# Patient Record
Sex: Female | Born: 1935 | Race: Black or African American | Hispanic: No | Marital: Married | Smoking: Former smoker
Health system: Southern US, Community
[De-identification: ages and names within clinical notes are randomized; demographics above are authoritative.]

## PROBLEM LIST (undated history)

## (undated) DIAGNOSIS — D649 Anemia, unspecified: Secondary | ICD-10-CM

## (undated) DIAGNOSIS — Z860100 Personal history of colon polyps, unspecified: Secondary | ICD-10-CM

## (undated) DIAGNOSIS — K922 Gastrointestinal hemorrhage, unspecified: Secondary | ICD-10-CM

## (undated) DIAGNOSIS — E7800 Pure hypercholesterolemia, unspecified: Secondary | ICD-10-CM

## (undated) DIAGNOSIS — I499 Cardiac arrhythmia, unspecified: Secondary | ICD-10-CM

## (undated) DIAGNOSIS — J90 Pleural effusion, not elsewhere classified: Secondary | ICD-10-CM

## (undated) DIAGNOSIS — Z9289 Personal history of other medical treatment: Secondary | ICD-10-CM

## (undated) DIAGNOSIS — E119 Type 2 diabetes mellitus without complications: Secondary | ICD-10-CM

## (undated) DIAGNOSIS — E049 Nontoxic goiter, unspecified: Secondary | ICD-10-CM

## (undated) DIAGNOSIS — Z923 Personal history of irradiation: Secondary | ICD-10-CM

## (undated) DIAGNOSIS — N186 End stage renal disease: Secondary | ICD-10-CM

## (undated) DIAGNOSIS — I10 Essential (primary) hypertension: Secondary | ICD-10-CM

## (undated) DIAGNOSIS — M810 Age-related osteoporosis without current pathological fracture: Secondary | ICD-10-CM

## (undated) DIAGNOSIS — J189 Pneumonia, unspecified organism: Secondary | ICD-10-CM

## (undated) DIAGNOSIS — R0989 Other specified symptoms and signs involving the circulatory and respiratory systems: Secondary | ICD-10-CM

## (undated) DIAGNOSIS — N183 Chronic kidney disease, stage 3 (moderate): Secondary | ICD-10-CM

## (undated) DIAGNOSIS — E079 Disorder of thyroid, unspecified: Secondary | ICD-10-CM

## (undated) DIAGNOSIS — I480 Paroxysmal atrial fibrillation: Secondary | ICD-10-CM

## (undated) DIAGNOSIS — E1129 Type 2 diabetes mellitus with other diabetic kidney complication: Secondary | ICD-10-CM

## (undated) DIAGNOSIS — D472 Monoclonal gammopathy: Secondary | ICD-10-CM

## (undated) DIAGNOSIS — K219 Gastro-esophageal reflux disease without esophagitis: Secondary | ICD-10-CM

## (undated) DIAGNOSIS — C50919 Malignant neoplasm of unspecified site of unspecified female breast: Secondary | ICD-10-CM

## (undated) DIAGNOSIS — R490 Dysphonia: Secondary | ICD-10-CM

## (undated) DIAGNOSIS — R0602 Shortness of breath: Secondary | ICD-10-CM

## (undated) DIAGNOSIS — I509 Heart failure, unspecified: Secondary | ICD-10-CM

## (undated) DIAGNOSIS — K259 Gastric ulcer, unspecified as acute or chronic, without hemorrhage or perforation: Secondary | ICD-10-CM

## (undated) DIAGNOSIS — J3800 Paralysis of vocal cords and larynx, unspecified: Secondary | ICD-10-CM

## (undated) HISTORY — PX: KNEE SURGERY: SHX244

## (undated) HISTORY — DX: Malignant neoplasm of unspecified site of unspecified female breast: C50.919

## (undated) HISTORY — PX: BREAST BIOPSY: SHX20

## (undated) HISTORY — PX: ABDOMINAL HYSTERECTOMY: SHX81

## (undated) HISTORY — PX: CYSTECTOMY: SUR359

## (undated) HISTORY — DX: Anemia, unspecified: D64.9

## (undated) HISTORY — PX: CARPAL TUNNEL RELEASE: SHX101

## (undated) HISTORY — PX: ESOPHAGOGASTRODUODENOSCOPY: SHX1529

## (undated) HISTORY — DX: Paroxysmal atrial fibrillation: I48.0

## (undated) HISTORY — DX: Disorder of thyroid, unspecified: E07.9

## (undated) HISTORY — PX: COLONOSCOPY: SHX174

## (undated) HISTORY — DX: Heart failure, unspecified: I50.9

## (undated) HISTORY — PX: THYROID SURGERY: SHX805

---

## 2000-12-21 ENCOUNTER — Other Ambulatory Visit: Admission: RE | Admit: 2000-12-21 | Discharge: 2000-12-21 | Payer: Self-pay | Admitting: Internal Medicine

## 2000-12-24 ENCOUNTER — Encounter: Payer: Self-pay | Admitting: Internal Medicine

## 2000-12-24 ENCOUNTER — Encounter: Admission: RE | Admit: 2000-12-24 | Discharge: 2000-12-24 | Payer: Self-pay | Admitting: Internal Medicine

## 2001-01-05 ENCOUNTER — Encounter: Admission: RE | Admit: 2001-01-05 | Discharge: 2001-04-05 | Payer: Self-pay | Admitting: Internal Medicine

## 2001-01-06 ENCOUNTER — Ambulatory Visit (HOSPITAL_COMMUNITY): Admission: RE | Admit: 2001-01-06 | Discharge: 2001-01-06 | Payer: Self-pay | Admitting: Internal Medicine

## 2001-01-06 ENCOUNTER — Encounter: Payer: Self-pay | Admitting: Internal Medicine

## 2001-01-11 ENCOUNTER — Encounter: Admission: RE | Admit: 2001-01-11 | Discharge: 2001-01-11 | Payer: Self-pay | Admitting: Hematology and Oncology

## 2001-01-11 ENCOUNTER — Encounter: Payer: Self-pay | Admitting: Internal Medicine

## 2001-01-13 ENCOUNTER — Encounter: Payer: Self-pay | Admitting: Internal Medicine

## 2001-01-13 ENCOUNTER — Encounter: Admission: RE | Admit: 2001-01-13 | Discharge: 2001-01-13 | Payer: Self-pay | Admitting: Internal Medicine

## 2001-02-09 ENCOUNTER — Encounter: Admission: RE | Admit: 2001-02-09 | Discharge: 2001-02-09 | Payer: Self-pay | Admitting: General Surgery

## 2001-02-09 ENCOUNTER — Encounter: Payer: Self-pay | Admitting: General Surgery

## 2001-02-09 ENCOUNTER — Ambulatory Visit (HOSPITAL_BASED_OUTPATIENT_CLINIC_OR_DEPARTMENT_OTHER): Admission: RE | Admit: 2001-02-09 | Discharge: 2001-02-09 | Payer: Self-pay | Admitting: General Surgery

## 2001-02-09 ENCOUNTER — Encounter (INDEPENDENT_AMBULATORY_CARE_PROVIDER_SITE_OTHER): Payer: Self-pay | Admitting: *Deleted

## 2001-03-23 ENCOUNTER — Ambulatory Visit: Admission: RE | Admit: 2001-03-23 | Discharge: 2001-03-23 | Payer: Self-pay | Admitting: Gynecology

## 2001-07-23 ENCOUNTER — Inpatient Hospital Stay (HOSPITAL_COMMUNITY): Admission: RE | Admit: 2001-07-23 | Discharge: 2001-07-30 | Payer: Self-pay | Admitting: Obstetrics and Gynecology

## 2001-07-23 ENCOUNTER — Encounter (INDEPENDENT_AMBULATORY_CARE_PROVIDER_SITE_OTHER): Payer: Self-pay

## 2001-07-26 ENCOUNTER — Encounter: Payer: Self-pay | Admitting: Obstetrics and Gynecology

## 2001-07-27 ENCOUNTER — Encounter: Payer: Self-pay | Admitting: Obstetrics and Gynecology

## 2001-08-26 ENCOUNTER — Encounter: Admission: RE | Admit: 2001-08-26 | Discharge: 2001-11-24 | Payer: Self-pay | Admitting: Internal Medicine

## 2001-08-27 ENCOUNTER — Inpatient Hospital Stay (HOSPITAL_COMMUNITY): Admission: EM | Admit: 2001-08-27 | Discharge: 2001-08-30 | Payer: Self-pay | Admitting: Emergency Medicine

## 2001-08-27 ENCOUNTER — Encounter: Payer: Self-pay | Admitting: Internal Medicine

## 2002-09-26 ENCOUNTER — Encounter: Payer: Self-pay | Admitting: Internal Medicine

## 2002-09-26 ENCOUNTER — Encounter: Admission: RE | Admit: 2002-09-26 | Discharge: 2002-09-26 | Payer: Self-pay | Admitting: Internal Medicine

## 2002-11-22 ENCOUNTER — Encounter: Admission: RE | Admit: 2002-11-22 | Discharge: 2003-02-20 | Payer: Self-pay | Admitting: Internal Medicine

## 2003-02-13 ENCOUNTER — Encounter (INDEPENDENT_AMBULATORY_CARE_PROVIDER_SITE_OTHER): Payer: Self-pay | Admitting: *Deleted

## 2003-02-13 ENCOUNTER — Ambulatory Visit (HOSPITAL_COMMUNITY): Admission: RE | Admit: 2003-02-13 | Discharge: 2003-02-13 | Payer: Self-pay | Admitting: General Surgery

## 2003-03-01 ENCOUNTER — Ambulatory Visit (HOSPITAL_COMMUNITY): Admission: RE | Admit: 2003-03-01 | Discharge: 2003-03-01 | Payer: Self-pay | Admitting: General Surgery

## 2003-11-01 ENCOUNTER — Ambulatory Visit (HOSPITAL_COMMUNITY): Admission: RE | Admit: 2003-11-01 | Discharge: 2003-11-01 | Payer: Self-pay | Admitting: Gastroenterology

## 2003-11-01 ENCOUNTER — Encounter (INDEPENDENT_AMBULATORY_CARE_PROVIDER_SITE_OTHER): Payer: Self-pay | Admitting: Specialist

## 2004-01-16 ENCOUNTER — Ambulatory Visit (HOSPITAL_COMMUNITY): Admission: RE | Admit: 2004-01-16 | Discharge: 2004-01-16 | Payer: Self-pay | Admitting: Gastroenterology

## 2005-04-15 ENCOUNTER — Ambulatory Visit (HOSPITAL_COMMUNITY): Admission: RE | Admit: 2005-04-15 | Discharge: 2005-04-15 | Payer: Self-pay | Admitting: General Surgery

## 2005-04-15 ENCOUNTER — Encounter (INDEPENDENT_AMBULATORY_CARE_PROVIDER_SITE_OTHER): Payer: Self-pay | Admitting: *Deleted

## 2008-04-25 ENCOUNTER — Encounter: Admission: RE | Admit: 2008-04-25 | Discharge: 2008-04-25 | Payer: Self-pay | Admitting: *Deleted

## 2008-04-26 ENCOUNTER — Ambulatory Visit (HOSPITAL_BASED_OUTPATIENT_CLINIC_OR_DEPARTMENT_OTHER): Admission: RE | Admit: 2008-04-26 | Discharge: 2008-04-26 | Payer: Self-pay | Admitting: *Deleted

## 2010-03-30 ENCOUNTER — Encounter (HOSPITAL_BASED_OUTPATIENT_CLINIC_OR_DEPARTMENT_OTHER): Payer: Self-pay | Admitting: General Surgery

## 2010-05-21 ENCOUNTER — Other Ambulatory Visit (HOSPITAL_COMMUNITY): Payer: Self-pay | Admitting: Endocrinology

## 2010-05-21 DIAGNOSIS — E0590 Thyrotoxicosis, unspecified without thyrotoxic crisis or storm: Secondary | ICD-10-CM

## 2010-06-10 ENCOUNTER — Ambulatory Visit (HOSPITAL_COMMUNITY)
Admission: RE | Admit: 2010-06-10 | Discharge: 2010-06-10 | Disposition: A | Discharge status: Home / Self Care | Payer: Medicare Other | Source: Ambulatory Visit | Attending: Endocrinology | Admitting: Endocrinology

## 2010-06-10 ENCOUNTER — Encounter (HOSPITAL_COMMUNITY)
Admission: RE | Admit: 2010-06-10 | Discharge: 2010-06-10 | Disposition: A | Discharge status: Home / Self Care | Payer: Medicare Other | Source: Ambulatory Visit | Attending: Endocrinology | Admitting: Endocrinology

## 2010-06-10 DIAGNOSIS — E0590 Thyrotoxicosis, unspecified without thyrotoxic crisis or storm: Secondary | ICD-10-CM | POA: Insufficient documentation

## 2010-06-10 DIAGNOSIS — E042 Nontoxic multinodular goiter: Secondary | ICD-10-CM | POA: Insufficient documentation

## 2010-06-11 MED ORDER — SODIUM IODIDE I 131 CAPSULE: 10.3000 | Freq: Once | INTRAVENOUS | Status: DC | PRN

## 2010-06-11 MED ORDER — SODIUM PERTECHNETATE TC 99M INJECTION: 10.0000 | Freq: Once | INTRAVENOUS | Status: DC | PRN

## 2010-06-25 LAB — BASIC METABOLIC PANEL
CO2: 29 mEq/L (ref 19–32)
Chloride: 104 mEq/L (ref 96–112)
GFR calc non Af Amer: 38 mL/min — ABNORMAL LOW (ref >60)
Glucose, Bld: 112 mg/dL — ABNORMAL HIGH (ref 70–99)
Potassium: 4 mEq/L (ref 3.5–5.1)
Sodium: 141 mEq/L (ref 135–145)

## 2010-06-25 LAB — GLUCOSE, CAPILLARY: Glucose-Capillary: 95 mg/dL (ref 70–99)

## 2010-07-23 NOTE — Op Note (Signed)
[name], Gross               ACCOUNT NO.:  [id number]   MEDICAL RECORD NO.:  [id number]          PATIENT TYPE:  AMB   LOCATION:  DSC                          FACILITY:  [facility]   PHYSICIAN:  Lowell Bouton, M.D.DATE OF BIRTH:  [birth year]   DATE OF PROCEDURE:  04/26/2008  DATE OF DISCHARGE:                               OPERATIVE REPORT   PREOPERATIVE DIAGNOSIS:  Right carpal tunnel syndrome.   POSTOPERATIVE DIAGNOSIS:  Right carpal tunnel syndrome.   PROCEDURE:  Decompression, median nerve, right carpal tunnel.   SURGEON:  Lowell Bouton, MD   ANESTHESIA:  Marcaine 0.5% local with sedation.   OPERATIVE FINDINGS:  The patient had marked tenosynovitis of her flexor  tendons.  There were no masses in the carpal canal and the motor branch  of the nerve was intact.   PROCEDURE:  Under 0.5% Marcaine local anesthesia with a tourniquet on  the right arm, the right hand was prepped and draped in the usual  fashion and after exsanguinating the limb, the tourniquet was inflated  to 250 mmHg.  A 3-cm longitudinal incision was made just ulnar to the  thenar crease and carried down through the subcutaneous tissues.  Blunt  dissection was carried through the superficial palmar fascia distal to  the transverse carpal ligament.  A hemostat was then placed in the  carpal canal up against the hook of the hamate and the transverse carpal  ligament was divided on the ulnar border of median nerve.  The proximal  end of the ligament was divided with the scissors after dissecting the  nerve away from the undersurface of the ligament.  The carpal canal was  then palpated and it was found to be adequately decompressed.  The nerve  was examined and the motor branch was identified.  The wound was  irrigated copiously with saline.  The skin was closed with 4-0 nylon  sutures.  Sterile dressings were applied followed by a volar wrist  splint.  The patient tolerated the procedure well  and went to the  recovery room awake in stable and good condition.      Lowell Bouton, M.D.  Electronically Signed     EMM/MEDQ  D:  04/26/2008  T:  04/27/2008  Job:  [id number]

## 2010-07-26 NOTE — Op Note (Signed)
NAMEJESSALYN, Alison Gross               ACCOUNT NO.:  [id number]   MEDICAL RECORD NO.:  [id number]          PATIENT TYPE:  AMB   LOCATION:  SDS                          FACILITY:  [facility]   PHYSICIAN:  Adolph Pollack, M.D.DATE OF BIRTH:  [birth year]   DATE OF PROCEDURE:  DATE OF DISCHARGE:                                 OPERATIVE REPORT   PREOPERATIVE DIAGNOSIS:  Recurrent lipoma of the posterior neck.   POSTOPERATIVE DIAGNOSIS:  Recurrent lipoma of the posterior neck, pending  pathology.   PROCEDURE:  Excision of 5 cm recurrent lipoma from posterior neck.   ANESTHESIA:  General plus 0.5% Marcaine with epinephrine.   INDICATIONS:  This is a 77 year old female who had a lipoma of the neck area  removed back in 2004. She began noticing swelling and another growth  coming back in the area. She indeed has a 5 cm recurrent mass present and  now presents for excision.  The procedure and the risks were discussed with  her preoperatively.   TECHNIQUE:  She is seen in the holding area and the area marked with my  initials in the posterior neck. She is then brought to the operating room on  the stretcher, and the general anesthetic was administered. She was turned  prone on the operating table and with appropriate padding. The posterior  neck was sterilely prepped and draped. The previous incision was reincised  through the skin and dermis. Lipomatous mass was noted immediately upon  incising the dermis. Using sharp dissection, I dissected a lipomatous mass  free from the underlying tissue in all directions. It was adherent to scar  and fascia deep. Once I excised part of this sharply, I then handed that  part off the field. inferiorly, there was more noted to be present, and this  was also excised sharply. In total, it measured about 5 cm. This was sent to  pathology, and again all appeared to be consistent with lipoma.   The area was inspected. Bleeding was controlled with electrocautery.  Once  hemostasis was adequate, local anesthetic solution was infiltrated  superficially and deep into the wound. The wound was then closed in two  layers, closing the subcutaneous tissue with a running 3-0 Monocryl suture,  and then closing the skin with a running 3-0 Monocryl subcuticular stitch.  Steri-Strips and a sterile dressing were applied.   She tolerated procedure well, without any apparent complications. She  subsequently was taken to recovery in satisfactory condition.      Adolph Pollack, M.D.  Electronically Signed    TJR/MEDQ  D:  04/15/2005  T:  04/15/2005  Job:  [id number]   cc:   Candyce Churn. Allyne Gee, M.D.  Fax: [phone]

## 2010-07-26 NOTE — Discharge Summary (Signed)
[facility]  Patient:    Alison Gross, Alison Gross Visit Number: [id number] MRN: [id number]          Service Type: GYN Location: 9300 [id number] Attending Physician:  Jaymes Graff A Dictated by:   Pierre Bali Normand Sloop, M.D. Admit Date:  07/23/2001 Discharge Date: 07/30/2001                             Discharge Summary  PREOPERATIVE DIAGNOSIS:  Symptomatic fibroids.  POSTOPERATIVE DIAGNOSES: 1. Status post total abdominal hysterectomy and bilateral    salpingo-oophorectomy. 2. Febrile morbidity with pelvic collection.  DISCHARGE MEDICATIONS: 1. Motrin. 2. Percocet. 3. Colace. 4. Dulcolax. 5. Levaquin. 6. Flagyl.  ACTIVITY:  The patient is to remain on pelvic rest, no douching, tampons, or sex.  DIET:  Regular.  WOUND CARE:  The patient is to keep the area of her wound clean and dry.  To call for any exudative greenish discharge from her incision.  DISCHARGE INSTRUCTIONS:  She is to call for a temperature greater than 100, heavy vaginal bleeding, or soaking more than a pad a hour.  FOLLOWUP:  To our office on Aug 03, 2001 at 10:30 a.m. for staple removal.  HISTORY OF PRESENT ILLNESS:  Alison Gross is a 77 year old African-American female who underwent a total abdominal hysterectomy and bilateral salpingo-oophorectomy for symptomatic fibroids.  The patients course was complicated by a pelvic collection and febrile morbidity with temperatures as high as 102.5.  The patient was started on Unasyn directly postoperatively for a temperature and did not improve.  A CT scan of the abdomen and pelvis was done which was significant for a 5 x 3 cm pelvic collection.  The patient had no other signs of infection or septic phlebitis, and both ureters were seen to be intact on CT scan.  Despite the Unasyn, the patient still had spiking temperatures, and was changed to broader coverage with Levaquin and Flagyl. After being started on the new regimen, the patient  still had some increased temperature, however, her temperature curve started to improve.  Dr. Irish Lack from interventional radiology was consulted for drainage of the pelvic collection.  He felt that the pelvic collection had decreased in size, and actually he was not able to aspirate any fluid.  The patient was then sent back and tolerated the procedure well.  Her white blood cell count remained normal.  Her blood cultures and urine cultures were negative.  Her temperature curve started to improve.  I did consult infectious disease which agreed with my plan of care.  The patient was told that if she had not improved by today, meaning being without a temperature for a 24 hour period, then she would be asked to probably go back to the operating room for possible drainage of this pelvic collection.  The patient has been afebrile for 24 hours this morning. Temperature max was 100.3, temperature current was 98.5.  She denies any nausea or vomiting.  Has had flatus and bowel movements.  The patient is in no acute distress.  Heart is regular.  Lungs are clear.  Abdomen is soft and nontender.  Incision is clean, dry, and intact.  Her hemoglobin is stable at 7.5.  Her white blood cell count was also stable with no left shift.  The patient is postoperative day #7, status post total abdominal hysterectomy and bilateral salpingo-oophorectomy with a pelvic collection, and febrile morbidity which is much improved.  The  patient will go home on Levaquin and Flagyl for a total of two weeks treatment as per IDs recommendation.  The patient will call for a temperature greater than 100.  She is to have staple removal in the office on Aug 03, 2001. Dictated by:   Pierre Bali. Normand Sloop, M.D. Attending Physician:  Michael Litter DD:  07/30/01 TD:  08/02/01 Job: [id number]

## 2010-07-26 NOTE — Discharge Summary (Signed)
[facility]  Patient:    Alison Gross, Alison Gross Visit Number: [id number] MRN: [id number]          Service Type: MED Location: 3W 0373 01 Attending Physician:  Gwynneth Aliment Dictated by:   Velna Hatchet, M.D. Admit Date:  08/27/2001 Discharge Date: 08/30/2001                             Discharge Summary  DATE OF BIRTH:  [birth year].  ADMISSION DIAGNOSES: 1. Diabetic ketoacidosis. 2. New onset diabetes. 3. Hypovolemia. 4. Hypertension. 5. Hypercholesterolemia. 6. Anemia.  DISCHARGE DIAGNOSES: 1. Diabetic ketoacidosis resolved. 2. New onset diabetes, type 2, requiring insulin. 3. Hypovolemia. 4. Hypertension. 5. Hypercholesterolemia. 6. Anemia. 7. Hypokalemia. 8. Renal ureteral disease.  HISTORY OF PRESENT ILLNESS AND HOSPITAL COURSE:  The patient is a 77 year old female with a past medical history significant for hypertension and hypercholesterolemia who presented to the office for evaluation of persistent weakness, recently had a hysterectomy for a uterine mass in May 2003.  She states that ever since she had the hysterectomy she has been feeling weak and run down.  She was assessed by gynecology the day prior to admission for evaluation of her surgical incision because she thought that it may have been infected causing her to be weak.  GYN evaluation was normal.  They checked a blood sugar which was found to be 383.  When she came to the office, her blood sugar was 400.  CMET and hemoglobin A1C were drawn.  She elected to go home on that day and so she was given Regular Insulin while in the office and started on Lantus 10 units at bedtime.  She was then sent to the outpatient diabetic nutrition center for insulin teaching.  She returned to the office on August 27, 2001 and her BMET revealed a potassium of 5.9, a glucose of 746, a BUN and creatinine of 51 and 2.3, and a hemoglobin A1C was 10.9.  Both units at both [facility]  were full so she was started on normal saline while in the office.  She was given 1 L of normal saline wide open while waiting for a bed.  At 1 oclock she still did not have a bed and so she was given 2 more liters of normal saline.  At the end of the day no beds were still available so she was sent to [facility] Emergency Room for IV fluids, IV insulin, while she was awaiting a bed.  In the ER, the patient had a blood sugar of 347 and she was given normal saline in 100 cc/hr and 6 units of IV insulin.  By the time she was sent to the emergency room, she was starting to feel much better.  Emergency room labs were significant for negative acetone. This is likely due to the fact that she received 3 L of normal saline while in the office as well as receiving insulin the night before.  The rest of her admission labs showed a BUN of 42 which had decreased from the day before at 51 and a creatinine of 1.7 which had decreased down from 2.3.  In addition, her anion gap the day prior to admission was 22 and by the time she reached the ER it was 6.  On August 28, 2001, the patient was then hypokalemic. fasting blood sugar at 195, she was still uncontrolled so her Lantus was increased  to 15 units at bedtime and hypokalemia was repleted by mouth.  Fasting lipid panel was significant for a total cholesterol of 154, triglycerides 139, HDL of 32 and an LDL of 94.  The plan was to continue her Zocor and her hemoglobin was 9.5. Following day fasting blood sugar 221 so her Lantus was increased to 20 units with sliding scale insulin.  On August 30, 2001, she was finally evaluated by the diabetic coordinator, fasting blood sugar still uncontrolled so her Lantus was increased to 25 units at bedtime.  She was also scheduled prior to discharge for outpatient diabetic classes over at the diabetic nutrition center and she was advised to contact the office daily after discharge to notify the office of her fasting  blood sugars so her Lantus dosages could be adjusted, a urine microalbumin would be checked as an outpatient and she was discharged in stable condition on August 30, 2001 after her teaching by the diabetic coordinator.  Lantus was continued at 25 units at bedtime with a sliding scale of Regular Insulin that was written out for the patient and she would continue her Zocor.  She was advised not to resume her Maxzide due to her profound dehydration secondary to the acidosis that was found on admission.  DISCHARGE MEDICATIONS: 1. Lantus 25 units subcu q.h.s. 2. Sliding scale insulin with Regular Insulin. 3. Zocor 40 mg at bedtime.  FOLLOWUP:  She was to follow up with Dr. Allyne Gee on September 08, 2001.  CONDITION ON DISCHARGE:  She was discharged in a stable and improved condition. Dictated by:   Velna Hatchet, M.D. Attending Physician:  Gwynneth Aliment DD:  09/09/01 TD:  09/13/01 Job: [id number]

## 2010-07-26 NOTE — Op Note (Signed)
NAME:  Alison Gross, Alison Gross                         ACCOUNT NO.:  [id number]   MEDICAL RECORD NO.:  [id number]                   PATIENT TYPE:  AMB   LOCATION:  ENDO                                 FACILITY:  [facility]   PHYSICIAN:  Anselmo Rod, M.D.               DATE OF BIRTH:  [birth year]   DATE OF PROCEDURE:  11/01/2003  DATE OF DISCHARGE:                                 OPERATIVE REPORT   PROCEDURE:  Esophagogastroduodenoscopy with biopsies.   ENDOSCOPIST:  Anselmo Rod, M.D.   INSTRUMENT USED:  Olympus video panendoscope.   INDICATIONS FOR PROCEDURE:  Iron deficient anemia in a 77 year old African  American female.  Rule out esophagitis, peptic ulcer disease, ___________,  etc.   PREPROCEDURE PREPARATION:  Informed consent was procured from the patient.  The patient was fasted for eight hours prior to the procedure.   PREPROCEDURE PHYSICAL:  VITAL SIGNS:  The patient had stable vital signs.  NECK:  Supple.  CHEST:  Clear to auscultation.  HEART:  S1 and S2 regular.  ABDOMEN:  Soft with normal bowel sounds.   DESCRIPTION OF PROCEDURE:  The patient was placed in the left lateral  decubitus position, sedated with 50 mg Demerol and 5 mg Versed in slow  incremental doses.  Once the patient was adequately sedated and maintained  on low-flow oxygen and continuous cardiac monitoring, the Olympus video  panendoscope was advanced through the mouthpiece, over the tongue, into the  esophagus and under direct vision.  The entire esophagus appeared normal  with no evidence of ring, stricture, masses, esophagitis, or Barrett's  mucosa.  The scope was then advanced into the stomach.  Antral gastritis was  noted with old heme overlying small antral erosions.  Biopsies were done to  rule out presence of H pylori by CLO-test.  The proximal small bowel  appeared normal.  Retroflexion of the high cardia revealed no abnormalities.   IMPRESSION:  1. Normal-appearing esophagus and proximal small  bowel.  2. Antral gastritis with patchy hemorrhagic areas, question hemorrhagic     gastritis. CLO-test done.   RECOMMENDATIONS:  1. Await pathology results.  2. Avoid nonsteroidals for now.  3. Treat with antibiotics if H pylori present on CLO-test.  4. PPI of choice.  5. Proceed with colonoscopy at this time.  Further recommendation will be     made after colonoscopy has been done.                                               Anselmo Rod, M.D.    JNM/MEDQ  D:  11/01/2003  T:  11/01/2003  Job:  [id number]   cc:   Candyce Churn. Allyne Gee, M.D.  [address]  [address]  [place]  [place] [address]  Fax: [phone]

## 2010-07-26 NOTE — Op Note (Signed)
[facility]  Patient:    Alison Gross, Alison Gross Visit Number: [id number] MRN: [id number]          Service Type: GYN Location: 9300 [id number] Attending Physician:  Jaymes Graff A Dictated by:   Pierre Bali Normand Sloop, M.D. Proc. Date: 07/23/01 Admit Date:  07/23/2001                             Operative Report  PREOPERATIVE DIAGNOSES:       Large uterine fibroids causing right ureter                               impingement.  POSTOPERATIVE DIAGNOSES:      Large uterine fibroids causing right ureter                               impingement.  PROCEDURES:                   Total abdominal hysterectomy, bilateral                               salpingo-oophorectomy.  ANESTHESIA:                   General endotracheal tube.  SURGEON:                      Naima A. Normand Sloop, M.D.  ASSISTANT:                    Silverio Lay, M.D.  ESTIMATED BLOOD LOSS:         850 cc.  INTRAVENOUS FLUIDS:           1700 cc Crystalloid.  URINE OUTPUT:                 125 cc clear urine at end of procedure.  COMPLICATIONS:                None.  FINDINGS:                     A 22 week sized uterus with a 20 week fundal fibroid and multiple uterine fibroids.  Tortuous vessels seen in the pelvis. Ovaries were atretic, but normal appearing and had normal appearing tubes. Normal abdominal pelvic anatomy.  The patient went to recovery room in stable condition.  PROCEDURE IN DETAIL:          The patient was taken to the operating room where she was placed in a dorsal supine position, prepped and draped in the normal sterile fashion.  Foley catheter was placed.  Risks and benefits of the surgery were reviewed with the patient before the procedure once again and patient did decide to go ahead and have her ovaries removed.  A vertical skin incision was then made from 2 cm above the symphysis pubis to about 1 cm below the umbilicus and carried down to the fascia.  The fascia was  then elevated off the rectus muscle in the midline and separated in the normal fashion.  The rectus muscle was then separated in the midline.  The peritoneum was identified, tented up, and entered sharply.  The peritoneal incision was extended superiorly and inferiorly with good visualization  of bowel and bladder.  A corkscrew was then placed into the uterine fibroid and used to help elevate the uterus out of the incision.  At this time the large uterine fundal fibroid which was about 20 weeks size, 20 cm in size, was found to be torsed; it was untorsed and with Kelly clamps were placed on the base of it and it was removed to be sent to pathology.  The fundal area was then made hemostatic using 0 Vicryl stitches.  Two Kelly clamps were then placed on each cornu as a means to elevate the uterus and both round ligaments were suture ligated, transected.  Hemostasis was assured.  The vesicouterine peritoneum was tented up, entered sharply with Metzenbaum scissors, and cut with Metzenbaum scissors.  Bladder flap was then created both digitally and sharply using Bovie cautery and Metzenbaum scissors and sponge on a stick.  The posterior leaf of the broad ligament was then incised on both sides and the ureters were found without difficulty on both sides.  At this point both infundibulopelvic ligaments were found to be clear of the ureter and they were both doubly clamp with right angles, transected, tied with a free tie on both sides, and then suture ligated.  Hemostasis was assured.  The uterine arteries were then skeletonized bilaterally, transected, and suture ligated. Hemostasis was assured.  The bladder was then further dissected away from the cervix and the cardinal and uterosacral ligaments were doubly clamped, transected, and suture ligated.  Hemostasis was assured.  The uterus and cervix were then amputated without difficulty.  The angles were then suture ligated and anchored to both  ureterosacral ligaments as a means to provide support for the vaginal cuff.  A small vaginal cuff opening which was suture ligated with a figure-of-eight 0 Vicryl.  The abdomen and pelvis were then irrigated with copious normal saline.  There were some small bleeding areas along the vaginal cuff which was made hemostatic with a figure-of-eight stitch and Bovie cautery.  Again, the abdomen and pelvis were irrigated with copious saline and hemostasis was assured.  All instruments were then removed from the abdomen.  The fascia was then closed with one PDS in a running fashion.  The subcutaneous tissue was then irrigated copiously with saline.  The bleeding areas were made hemostatic using Bovie cautery.  The skin was closed with staples.  Sponge, lap, and needle counts were correct x2.  The patient went to the recovery room in stable condition. Dictated by:   Pierre Bali. Normand Sloop, M.D. Attending Physician:  Michael Litter DD:  07/23/01 TD:  07/26/01 Job: [id number]

## 2010-07-26 NOTE — Op Note (Signed)
NAME:  Alison Gross, Alison Gross                         ACCOUNT NO.:  [id number]   MEDICAL RECORD NO.:  [id number]                   PATIENT TYPE:  AMB   LOCATION:  ENDO                                 FACILITY:  [facility]   PHYSICIAN:  Anselmo Rod, M.D.               DATE OF BIRTH:  Mar 30, 1932   DATE OF PROCEDURE:  11/01/2003  DATE OF DISCHARGE:                                 OPERATIVE REPORT   PROCEDURE:  Colonoscopy with snare polypectomy x1 and cold biopsies x3.   ENDOSCOPIST:  Anselmo Rod, M.D.   INSTRUMENT USED:  Olympus video colonoscope.   INDICATIONS FOR PROCEDURE:  A 77 year old [place] American female with  history of iron deficient anemia, rule out colonic polyps, masses, etc.   PREPROCEDURE PREPARATION:  Informed consent was procured from the patient.  The patient was fasted for eight hours prior to the procedure and prepped  with a bottle of magnesium citrate and a gallon of GoLYTELY the night prior  to the procedure.   PREPROCEDURE PHYSICAL:  VITAL SIGNS:  The patient had stable vital signs.  NECK:  Supple.  CHEST:  Clear to auscultation.  HEART:  S1 and S2 regular.  ABDOMEN:  Soft with normal bowel sounds.   DESCRIPTION OF PROCEDURE:  The patient was placed in the left lateral  decubitus position, sedated with Demerol and Versed for the EGD.  No  additional sedation was used for the colonoscopy.  Once the patient was  adequately sedated and maintained on low-flow oxygen and continuous cardiac  monitoring, the Olympus video colonoscope was advanced from the rectum to  the cecum and there was some residual stool in the colon.  Multiple washes  were done.  The appendiceal orifice and ileocecal valve were clearly  visualized and photographed.  A large cecal diverticulum was noted.  Three  small sessile polyps were biopsied from the cecal base close to the  appendiceal orifice.  A few scattered diverticula were present throughout  the colon, especially in the sigmoid  colon.  A small sessile polyp was  snared from the distal right colon.  Retroflexion in the rectum revealed  small internal hemorrhoids, a small submucosal, yellowish lesion was noted  in the mid right colon most consistent with a lipoma.  This was not  biopsied.   IMPRESSION:  1. Small sessile polyps, biopsied from the cecal base.  2. Another 3-4 cm sessile snared from the distal right colon.  3. A few scattered diverticula including a isolated cecal diverticulum.  4. Small internal hemorrhoids.  5. Lipoma in the mid right colon (not biopsied).   RECOMMENDATIONS:  1. Await pathology results.  2. Avoid nonsteroidals including aspirin for the next four weeks.  3. Outpatient followup for the next week for repeat CBC and further     recommendations.  Anselmo Rod, M.D.    JNM/MEDQ  D:  11/01/2003  T:  11/01/2003  Job:  [id number]   cc:   Candyce Churn. Allyne Gee, M.D.  [address]  [address]  [place]  [place] [address]  Fax: [phone]

## 2010-07-26 NOTE — Op Note (Signed)
[place]. [facility]  Patient:    Alison Gross, Alison Gross Visit Number: [id number] MRN: [id number]          Service Type: DSU Location: [facility] Attending Physician:  Janalyn Rouse Dictated by:   Rose Phi. Maple Hudson, M.D. Proc. Date: 02/09/01 Admit Date:  02/09/2001   CC:         Velna Hatchet, M.D.   Operative Report  PREOPERATIVE DIAGNOSIS:  Abnormal left breast mammogram with two separate lesions.  POSTOPERATIVE DIAGNOSIS:  Abnormal left breast mammogram with two separate lesions.  OPERATION PERFORMED:  Left breast biopsy x 2 with preoperative needle localization.  SURGEON:  Rose Phi. Maple Hudson, M.D.  ANESTHESIA:  Monitored anesthesia care.  DESCRIPTION OF PROCEDURE:  The patient was placed on the operating table and the left breast prepped and draped in the usual fashion.  Both lesions were kind of at the 11 and 12:30 position and had been previously localized with two separate wires.  By triangulating I marked Xs as to where I thought they were in the breast.  We then made a curvilinear incision outlined with a marking pencil that would incorporate both of these.  The area was then thoroughly infiltrated with 1% Xylocaine with Adrenalin.  Incision was made and I was able to deliver both wires into the incision and then excised both lesions through the one separate incision.  Specimen mammography confirmed the removal of both lesions.  Hemostasis obtained with the cautery.  Subcuticular closure with 4-0 Monocryl and Steri-Strips carried out.  Dressing applied. The patient was transferred to the recovery room in satisfactory condition having tolerated the procedure well. Dictated by:   Rose Phi. Maple Hudson, M.D. Attending Physician:  Janalyn Rouse DD:  02/09/01 TD:  02/09/01 Job: [id number]

## 2010-07-26 NOTE — Op Note (Signed)
NAME:  Alison Gross, Alison Gross                         ACCOUNT NO.:  [id number]   MEDICAL RECORD NO.:  [id number]                   PATIENT TYPE:  OIB   LOCATION:  NA                                   FACILITY:  [facility]   PHYSICIAN:  Leonie Man, M.D.                DATE OF BIRTH:  1932/08/29   DATE OF PROCEDURE:  02/13/2003  DATE OF DISCHARGE:                                 OPERATIVE REPORT   PREOPERATIVE DIAGNOSIS:  Lipoma posterior neck, 10 cm.   POSTOPERATIVE DIAGNOSIS:  Lipoma posterior neck, 10 cm.   PROCEDURE:  Excision of mass posterior neck.   SURGEON:  Leonie Man, M.D.   ASSISTANT:  Nurse.   ANESTHESIA:  General.   INDICATIONS FOR PROCEDURE:  The patient is a 77 year old woman with an  enlarging mass in the posterior neck primarily over the left trapezius and  extending to the midline  up into the hairline and down to the shoulder. She  comes to the operating room for removal of this mass after  the risks and  potential benefits of surgery have been fully discussed. All questions were  answered and consent was obtained.   DESCRIPTION OF PROCEDURE:  Following  the induction of satisfactory general  endotracheal anesthesia, the patient was positioned in the right lateral  position. The neck  and the left shoulder were prepped and draped to be  included in the sterile operative field. I infiltrated the region around the  mass with 0.25% bupivacaine with epinephrine 1:200,000.   I made a transverse incision right at the hairline, deepening this through  the skin and subcutaneous tissue and carrying the dissection down the lipoma  which was multiloculated and invading into multiple interstices within the  small __________ of the posterior neck muscles. This was dissected clear  of  all of its attachments and all portions of the mass were dissected free and  the dissection was carried down to the trapezius muscles and this was  dissected free and removed in its entirety and  forwarded for pathologic  evaluation. Hemostasis was assured with electrocautery.   Sponge and instrument counts were verified. The subcutaneous tissue was  closed with interrupted 3-0 Vicryl suture. The skin was closed with a 4-0  Monocryl suture  and then reinforced with Steri-Strips. Sterile dressings  were applied.   The anesthetic was reversed. The patient was removed from the operating room  to the recovery room in stable condition. She tolerated  the procedure well.                                               Leonie Man, M.D.   PB/MEDQ  D:  02/13/2003  T:  02/13/2003  Job:  [id number]

## 2010-07-26 NOTE — Consult Note (Signed)
[facility]  Patient:    Alison Gross, NEYER Visit Number: [id number] MRN: [id number]          Service Type: GON Location: GYN Attending Physician:  Jeannette Corpus Dictated by:   Rande Brunt. Clarke-Pearson, M.D. Proc. Date: 03/23/01 Admit Date:  03/23/2001   CC:         Naima A. Normand Sloop, M.D.  Telford Nab, R.N.   Consultation Report  REASON FOR CONSULTATION:  The patient is a 77 year old white female referred by Dr. Normand Sloop for evaluation of an opinion regarding management of presumed uterine fibroids.  The patient reports that she has had uterine fibroids for at least the past 20 years, and notes that over the last year or so the fibroids seem to be somewhat smaller.  Approximately 20 years she was apparently advised to have a hysterectomy, which she deferred having performed.  Currently, she is not having any pelvic pain or pressure, or any abnormal bleeding.  She has had imaging studies in October, most notable which is an MRI of the pelvis showing a 23 x 18 x 12 cm fibroid uterus with a prominent 17 cm fibroid in the fundus. In addition and of concern, is a mildly dilated right ureter, as well as slow venous flow in the iliac vein secondary to venous compression by this large mass.  PAST MEDICAL HISTORY: 1. Hypertension. 2. Elevated cholesterol.  ALLERGIES:  No known drug allergies.  PAST SURGICAL HISTORY:  Thyroidectomy.  CURRENT MEDICATIONS: 1. Zocor. 2. Triamterene/HCTZ 37.5/25 mg.  PAST OBSTETRICAL HISTORY:  Gravida 2.  REVIEW OF SYSTEMS:  Negative, except as noted in history of present illness.  PHYSICAL EXAMINATION:  VITAL SIGNS:  Height 5 feet 5 inches, weight 171 pounds, blood pressure 128/78, pulse 64, respiratory rate 16.  GENERAL:  The patient is a healthy African-American female in no acute distress.  HEENT:  Negative.  NECK:  Supple without thyromegaly.  There is no supraclavicular or  inguinal adenopathy.  ABDOMEN:  Soft and nontender.  She does have a palpable mass which extends slightly above the umbilicus and is very mobile, consistent with uterine fibroids.  PELVIC:  EGBUS normal.  Vagina is clean.  Cervix is deviated anteriorly.  By manual examination there is a multinodular mass with a prominent fundal fibroid which is quite large.  This is very mobile, and there is no deep cul-de-sac disease.  IMPRESSION:  Large uterine fibroids which apparently have been present for a long time, and are essentially asymptomatic.  I would, however, recommend the patient undergo total abdominal hysterectomy and bilateral salpingo-oophorectomy, predominately because of the partial ureteral obstruction and slow venous flow.  I reassured the patient I doubted this was a gynecologic malignancy.  I indicated that I felt Dr. Normand Sloop and her partners were very competent and capable of managing these fibroids, and will return the patient to Dr. Normand Sloop for scheduling of surgery.  I would be happy to be on standby the day of surgery if Dr. Normand Sloop wished. Dictated by:   Rande Brunt. Clarke-Pearson, M.D. Attending Physician:  Jeannette Corpus DD:  03/23/01 TD:  03/24/01 Job: [id number]

## 2010-07-26 NOTE — H&P (Signed)
[facility]  Patient:    Alison Gross, Alison Gross Visit Number: [id number] MRN: [id number]          Service Type: Attending:  Naima A. Normand Sloop, M.D. Dictated by:   Pierre Bali. Normand Sloop, M.D. Adm. Date:  07/23/01                           History and Physical  HISTORY OF PRESENT ILLNESS:   The patient is a 77 year old African-American female, G2, P2, whose last menstrual period was at age 52, who presented for evaluation of fibroids causing ureter impingement.  The patient states she has had fibroids for a very long time, but they seemed to increase in size after menopause.  She denied having any weight loss or change in bowel or bladder habits.  No vaginal bleeding, abdominal pain, or back pain.  The patient denied any vaginal bleeding or spotting, says she has not had any since her last menstrual period.  Has had no postmenopausal bleeding.  PAST MEDICAL HISTORY:         Significant for a breast mass, increased cholesterol, and hypertension.  PAST SURGICAL HISTORY:        Significant for a thyroid goiter removed in the 1970s and biopsy of a breast mass.  FAMILY HISTORY:               Significant for diabetes.  No GYN CA, no cancer.  PAST GYNECOLOGIC HISTORY:     The patient is sexually active with her husband. She has no history of sexually transmitted diseases and no history of abnormal Pap smear.  The patient has already gone through menopause and currently has no menopausal symptoms.  IMAGING STUDIES:              MRI demonstrated the uterus to be 23 x 18 x 12 cm uterus with a right ureter impingement.  It shows some mild dilation.  The ultrasound was the same as the MRI, except they were unable to see the adnexa.  PHYSICAL EXAMINATION:  VITAL SIGNS:                  The patients blood pressure was 120/80.  Her weight is 165 pounds.  GENERAL:                      She is a well-developed, well-nourished female in no apparent distress.  HEENT:                         Pupils are equal.  Hearing is normal.  Throat is clear.  NECK:                         Thyroid is not enlarged.  CHEST:                        Chest is clear to auscultation bilaterally.  BREASTS:                      No masses, discharge, skin changes, or nipple retraction bilaterally.  CARDIAC:                      Heart is regular rate and rhythm.  BACK:  No CVA tenderness.  ABDOMEN:                      Nontender, 20 week size fibroids.  EXTREMITIES:                  No clubbing, cyanosis, or edema.  NEUROLOGIC:                   Within normal limits.  PELVIC:                       The vulvar and vaginal exam is within normal limits.  Cervix was nontender without any lesions.  Uterus is about 20 weeks size, irregular, and nontender.  Could not feel any adnexal masses or rectal masses.  PAST PROCEDURES:              The patient had a colonoscopy.  She had a polyp removed, and it was noted just to be a tubular adenoma.  No high-grade dysplasia or malignancy identified.  Ultrasound and MRI as above.  The patient had a Pap smear done in October 2002, which was found to be normal.  The patient had a breast biopsy and surgical consultation with Rose Phi. Maple Hudson, M.D.  Per patient, it was a benign biopsy.  The biopsy showed complex fibroadenoma with atypical ductal hyperplasia, with no evidence of malignancy. She is currently being followed by Dr. Maple Hudson for management of her breast findings.  IMPRESSION/PLAN:              Also, because of _____, her fibroids seemed to grow after menopause.  Sent her for evaluation by Reuel Boom L. Clarke-Pearson, M.D., who felt this was still a benign process because she has had the fibroids for such a long time.  He said that he doubted that this was a GYN malignancy and felt comfortable with the patient just having a total abdominal hysterectomy and bilateral salpingo-oophorectomy.  The patient did decide  to remove her ovaries with the uterus.  She understands that the risks are but not limited to bleeding, infection, damage to her internal organs like bowel and bladder and major blood vessels. Dictated by:   Pierre Bali. Normand Sloop, M.D. Attending:  Naima A. Dillard, M.D. DD:  07/23/01 TD:  07/23/01 Job: [id number]

## 2010-08-19 ENCOUNTER — Other Ambulatory Visit (HOSPITAL_COMMUNITY): Payer: Self-pay | Admitting: Endocrinology

## 2010-08-19 DIAGNOSIS — E0590 Thyrotoxicosis, unspecified without thyrotoxic crisis or storm: Secondary | ICD-10-CM

## 2010-08-26 ENCOUNTER — Encounter (HOSPITAL_COMMUNITY)
Admission: RE | Admit: 2010-08-26 | Discharge: 2010-08-26 | Disposition: A | Discharge status: Home / Self Care | Payer: Medicare Other | Source: Ambulatory Visit | Attending: Endocrinology | Admitting: Endocrinology

## 2010-08-26 DIAGNOSIS — E0590 Thyrotoxicosis, unspecified without thyrotoxic crisis or storm: Secondary | ICD-10-CM

## 2010-08-26 DIAGNOSIS — E0520 Thyrotoxicosis with toxic multinodular goiter without thyrotoxic crisis or storm: Secondary | ICD-10-CM | POA: Insufficient documentation

## 2010-08-26 MED ORDER — SODIUM IODIDE I 131 CAPSULE
43.4000 | Freq: Once | INTRAVENOUS | Status: AC | PRN
  Administered 2010-08-26: 43.4 via ORAL

## 2011-07-11 ENCOUNTER — Other Ambulatory Visit: Payer: Self-pay | Admitting: Internal Medicine

## 2011-07-11 DIAGNOSIS — N63 Unspecified lump in unspecified breast: Secondary | ICD-10-CM

## 2011-07-15 ENCOUNTER — Other Ambulatory Visit: Payer: PRIVATE HEALTH INSURANCE

## 2012-01-24 ENCOUNTER — Inpatient Hospital Stay (HOSPITAL_COMMUNITY)
Admission: EM | Admit: 2012-01-24 | Discharge: 2012-01-26 | DRG: 378 | Disposition: A | Discharge status: Home / Self Care | Payer: Medicare Other | Attending: Internal Medicine | Admitting: Internal Medicine

## 2012-01-24 ENCOUNTER — Encounter (HOSPITAL_COMMUNITY)
Admission: EM | Disposition: A | Discharge status: Home / Self Care | Payer: Self-pay | Source: Home / Self Care | Attending: Internal Medicine

## 2012-01-24 ENCOUNTER — Encounter (HOSPITAL_COMMUNITY): Payer: Self-pay | Admitting: Family Medicine

## 2012-01-24 DIAGNOSIS — K922 Gastrointestinal hemorrhage, unspecified: Secondary | ICD-10-CM

## 2012-01-24 DIAGNOSIS — D62 Acute posthemorrhagic anemia: Secondary | ICD-10-CM | POA: Diagnosis present

## 2012-01-24 DIAGNOSIS — N390 Urinary tract infection, site not specified: Secondary | ICD-10-CM | POA: Diagnosis present

## 2012-01-24 DIAGNOSIS — T3995XA Adverse effect of unspecified nonopioid analgesic, antipyretic and antirheumatic, initial encounter: Secondary | ICD-10-CM | POA: Diagnosis present

## 2012-01-24 DIAGNOSIS — M1710 Unilateral primary osteoarthritis, unspecified knee: Secondary | ICD-10-CM | POA: Diagnosis present

## 2012-01-24 DIAGNOSIS — N289 Disorder of kidney and ureter, unspecified: Secondary | ICD-10-CM

## 2012-01-24 DIAGNOSIS — K920 Hematemesis: Secondary | ICD-10-CM

## 2012-01-24 DIAGNOSIS — E119 Type 2 diabetes mellitus without complications: Secondary | ICD-10-CM | POA: Diagnosis present

## 2012-01-24 DIAGNOSIS — K254 Chronic or unspecified gastric ulcer with hemorrhage: Principal | ICD-10-CM | POA: Diagnosis present

## 2012-01-24 DIAGNOSIS — I10 Essential (primary) hypertension: Secondary | ICD-10-CM

## 2012-01-24 DIAGNOSIS — D649 Anemia, unspecified: Secondary | ICD-10-CM

## 2012-01-24 HISTORY — DX: Type 2 diabetes mellitus without complications: E11.9

## 2012-01-24 HISTORY — DX: Essential (primary) hypertension: I10

## 2012-01-24 HISTORY — DX: Gastrointestinal hemorrhage, unspecified: K92.2

## 2012-01-24 HISTORY — DX: Pure hypercholesterolemia, unspecified: E78.00

## 2012-01-24 HISTORY — PX: ESOPHAGOGASTRODUODENOSCOPY: SHX5428

## 2012-01-24 LAB — CBC
Hemoglobin: 7.5 g/dL — ABNORMAL LOW (ref 12.0–15.0)
Hemoglobin: 7.9 g/dL — ABNORMAL LOW (ref 12.0–15.0)
MCH: 30.8 pg (ref 26.0–34.0)
MCHC: 33 g/dL (ref 30.0–36.0)
MCHC: 33.3 g/dL (ref 30.0–36.0)
MCV: 92.4 fL (ref 78.0–100.0)
MCV: 85.2 fL (ref 78.0–100.0)
Platelets: 328 10*3/uL (ref 150–400)
Platelets: 161 10*3/uL (ref 150–400)
RBC: 2.68 MIL/uL — ABNORMAL LOW (ref 3.87–5.11)
RBC: 2.77 MIL/uL — ABNORMAL LOW (ref 3.87–5.11)
RDW: 13.5 % (ref 11.5–15.5)
WBC: 8.6 10*3/uL (ref 4.0–10.5)
WBC: 7.3 10*3/uL (ref 4.0–10.5)

## 2012-01-24 LAB — CBC WITH DIFFERENTIAL/PLATELET
Basophils Absolute: 0 10*3/uL (ref 0.0–0.1)
HCT: 27.2 % — ABNORMAL LOW (ref 36.0–46.0)
Lymphocytes Relative: 10 % — ABNORMAL LOW (ref 12–46)
Monocytes Absolute: 0.7 10*3/uL (ref 0.1–1.0)
Neutro Abs: 11.2 10*3/uL — ABNORMAL HIGH (ref 1.7–7.7)
RBC: 3.18 MIL/uL — ABNORMAL LOW (ref 3.87–5.11)
RDW: 14.4 % (ref 11.5–15.5)
WBC: 13.5 10*3/uL — ABNORMAL HIGH (ref 4.0–10.5)

## 2012-01-24 LAB — COMPREHENSIVE METABOLIC PANEL
ALT: 20 U/L (ref 0–35)
Albumin: 3.2 g/dL — ABNORMAL LOW (ref 3.5–5.2)
Alkaline Phosphatase: 74 U/L (ref 39–117)
Potassium: 3.9 mEq/L (ref 3.5–5.1)
Sodium: 143 mEq/L (ref 135–145)
Total Protein: 6.7 g/dL (ref 6.0–8.3)

## 2012-01-24 LAB — URINALYSIS, ROUTINE W REFLEX MICROSCOPIC
Nitrite: NEGATIVE
Specific Gravity, Urine: 1.02 (ref 1.005–1.030)
pH: 5 (ref 5.0–8.0)

## 2012-01-24 LAB — SAMPLE TO BLOOD BANK

## 2012-01-24 LAB — VITAMIN B12: Vitamin B-12: 547 pg/mL (ref 211–911)

## 2012-01-24 LAB — PROTIME-INR: INR: 1.18 (ref 0.00–1.49)

## 2012-01-24 LAB — RETICULOCYTES: Retic Ct Pct: 1.6 % (ref 0.4–3.1)

## 2012-01-24 LAB — IRON AND TIBC
Saturation Ratios: 30 % (ref 20–55)
TIBC: 212 ug/dL — ABNORMAL LOW (ref 250–470)

## 2012-01-24 LAB — LIPASE, BLOOD: Lipase: 26 U/L (ref 11–59)

## 2012-01-24 SURGERY — EGD (ESOPHAGOGASTRODUODENOSCOPY)
Anesthesia: Moderate Sedation

## 2012-01-24 MED ORDER — SODIUM CHLORIDE 0.9 % IV SOLN
1000.0000 mL | Freq: Once | INTRAVENOUS | Status: AC
  Administered 2012-01-24: 1000 mL via INTRAVENOUS

## 2012-01-24 MED ORDER — ONDANSETRON HCL 4 MG/2ML IJ SOLN: 4.0000 mg | Freq: Four times a day (QID) | INTRAMUSCULAR | Status: DC | PRN

## 2012-01-24 MED ORDER — ONDANSETRON HCL 4 MG PO TABS: 4.0000 mg | ORAL_TABLET | Freq: Four times a day (QID) | ORAL | Status: DC | PRN

## 2012-01-24 MED ORDER — SODIUM CHLORIDE 0.9 % IV SOLN
80.0000 mg | Freq: Two times a day (BID) | INTRAVENOUS | Status: DC
  Filled 2012-01-24 (×2): qty 80

## 2012-01-24 MED ORDER — SODIUM CHLORIDE 0.9 % IV SOLN
1000.0000 mL | INTRAVENOUS | Status: DC
  Administered 2012-01-24 – 2012-01-26 (×3): 1000 mL via INTRAVENOUS

## 2012-01-24 MED ORDER — SODIUM CHLORIDE 0.9 % IJ SOLN: 3.0000 mL | Freq: Two times a day (BID) | INTRAMUSCULAR | Status: DC

## 2012-01-24 MED ORDER — ONDANSETRON HCL 4 MG/2ML IJ SOLN
4.0000 mg | Freq: Once | INTRAMUSCULAR | Status: AC
  Administered 2012-01-24: 4 mg via INTRAVENOUS
  Filled 2012-01-24: qty 2

## 2012-01-24 MED ORDER — ONDANSETRON HCL 4 MG/2ML IJ SOLN: 4.0000 mg | Freq: Three times a day (TID) | INTRAMUSCULAR | Status: AC | PRN

## 2012-01-24 MED ORDER — BUTAMBEN-TETRACAINE-BENZOCAINE 2-2-14 % EX AERO
INHALATION_SPRAY | CUTANEOUS | Status: DC | PRN
  Administered 2012-01-24: 2 via TOPICAL

## 2012-01-24 MED ORDER — ATORVASTATIN CALCIUM 40 MG PO TABS
40.0000 mg | ORAL_TABLET | Freq: Every day | ORAL | Status: DC
  Administered 2012-01-24 – 2012-01-26 (×3): 40 mg via ORAL
  Filled 2012-01-24 (×3): qty 1

## 2012-01-24 MED ORDER — CARVEDILOL 3.125 MG PO TABS
3.1250 mg | ORAL_TABLET | Freq: Two times a day (BID) | ORAL | Status: DC
  Administered 2012-01-24 – 2012-01-26 (×5): 3.125 mg via ORAL
  Filled 2012-01-24 (×6): qty 1

## 2012-01-24 MED ORDER — ACETAMINOPHEN 500 MG PO TABS
1000.0000 mg | ORAL_TABLET | Freq: Four times a day (QID) | ORAL | Status: DC | PRN
  Filled 2012-01-24: qty 2

## 2012-01-24 MED ORDER — ACETAMINOPHEN 500 MG PO TABS
1000.0000 mg | ORAL_TABLET | Freq: Once | ORAL | Status: AC
  Administered 2012-01-24: 1000 mg via ORAL

## 2012-01-24 MED ORDER — HYDROCODONE-ACETAMINOPHEN 5-325 MG PO TABS: 1.0000 | ORAL_TABLET | ORAL | Status: DC | PRN

## 2012-01-24 MED ORDER — SODIUM CHLORIDE 0.9 % IV SOLN: INTRAVENOUS | Status: DC

## 2012-01-24 MED ORDER — FENTANYL CITRATE 0.05 MG/ML IJ SOLN
INTRAMUSCULAR | Status: DC | PRN
  Administered 2012-01-24: 12.5 ug via INTRAVENOUS
  Administered 2012-01-24: 25 ug via INTRAVENOUS

## 2012-01-24 MED ORDER — PANTOPRAZOLE SODIUM 40 MG PO TBEC
40.0000 mg | DELAYED_RELEASE_TABLET | Freq: Two times a day (BID) | ORAL | Status: DC
  Administered 2012-01-24 – 2012-01-26 (×4): 40 mg via ORAL
  Filled 2012-01-24 (×6): qty 1

## 2012-01-24 MED ORDER — ADULT MULTIVITAMIN W/MINERALS CH
1.0000 | ORAL_TABLET | Freq: Every day | ORAL | Status: DC
  Administered 2012-01-25 – 2012-01-26 (×2): 1 via ORAL
  Filled 2012-01-24 (×3): qty 1

## 2012-01-24 MED ORDER — MIDAZOLAM HCL 10 MG/2ML IJ SOLN
INTRAMUSCULAR | Status: DC | PRN
  Administered 2012-01-24 (×2): 1 mg via INTRAVENOUS
  Administered 2012-01-24: 2 mg via INTRAVENOUS

## 2012-01-24 MED ORDER — SODIUM CHLORIDE 0.9 % IV SOLN
INTRAVENOUS | Status: DC
  Administered 2012-01-24: 11:00:00 via INTRAVENOUS

## 2012-01-24 NOTE — Op Note (Signed)
[facility] [address] [place], [address]   ENDOSCOPY PROCEDURE REPORT  PATIENT: Alison, Gross  MR#: [id number] BIRTHDATE: 10/23/1932 , 79  yrs. old GENDER: Female  ENDOSCOPIST: Vida Rigger, MD REFERRED XB:[id number] Ganji, M.D.  PROCEDURE DATE:  01/24/2012 PROCEDURE:   EGD w/ biopsy ASA CLASS:   Class II INDICATIONS:hematemesis.  MEDICATIONS: Fentanyl-Detailed 37.5 mcg IV and Versed 4 mg IV  TOPICAL ANESTHETIC:used  DESCRIPTION OF PROCEDURE:   After the risks benefits and alternatives of the procedure were thoroughly explained, informed consent was obtained.  The EG-[id number] ([id number])  endoscope was introduced through the mouth and advanced to the second portion of the duodenum , limited by Without limitations.no active bleeding was seen and the scope was slowly withdrawn and the findings are recorded below and we took 2 biopsies of the antrum and 2 of the proximal stomach to rule out H. pylori   The instrument was slowly withdrawn as the mucosa was fully examined.the patient tolerated the procedure well there was no obvious immediate complication         FINDINGS:1. Small mid stomach ulcer with flat black Spot could not make bleed with washing 2. Few antral erosions and multiple duodenal bulb erosions 3. Otherwise within normal limits EGD status post antral and proximal stomach biopsies to rule out Helicobacter  COMPLICATIONS:none  ENDOSCOPIC IMPRESSION:above   RECOMMENDATIONS:minimize aspirin and nonsteroidals going forward treat arthritis pain differentlyanduse long-term pump inhibitorsparticularly if unable to stop the above and treat H. pylori if positive and can slowly advance diet tomorrow if no signs of further bleeding  REPEAT EXAM: as needed   _______________________________ Vida Rigger, MD eSigned:  Vida Rigger, MD 01/24/2012 1:42 PM    QI:[id number] Jacinto Halim, MD  PATIENT NAME:  Alison, Gross MR#: [id number]

## 2012-01-24 NOTE — Progress Notes (Signed)
Pt back from endo. VSS. Placed back on telemetry.

## 2012-01-24 NOTE — ED Notes (Signed)
Patient states that she got sick around 330 or 4am. States she she vomited 3 times and that it was dark red.

## 2012-01-24 NOTE — Progress Notes (Signed)
Confirmed with Dr Robb Matar.  Only one unit of PRBC ordered.  Other order is a duplicate.

## 2012-01-24 NOTE — Consult Note (Signed)
Reason for Consult: Upper GI bleeding Referring Physician: Hospital team  Alison Gross is an 78 y.o. female.  HPI: Patient with an essentially negative GI history and a nondiagnostic colonoscopy by another gastroenterologist a few years ago but no previous upper test who has been on both aspirin and Aleve for a while and began throwing up bright red blood earlier today and having some blood in her bowels and was admitted for further workup and plans and her family history is negative for any previous GI issues and supposedly she tolerated her colonoscopy well  Past Medical History  Diagnosis Date  . Diabetes mellitus without complication   . Hypertension   . Hypercholesterolemia     Past Surgical History  Procedure Date  . Abdominal hysterectomy   . Goiter   . Carpal tunnel release   . Breast biopsy     Family History  Problem Relation Age of Onset  . Brain cancer Mother   . Aneurysm Father     Social History:  reports that she has quit smoking. Her smoking use included Cigarettes. She smoked .5 packs per day. She does not have any smokeless tobacco history on file. She reports that she does not drink alcohol or use illicit drugs.  Allergies: No Known Allergies  Medications: I have reviewed the patient's current medications.  Results for orders placed during the hospital encounter of 01/24/12 (from the past 48 hour(s))  URINALYSIS, ROUTINE W REFLEX MICROSCOPIC     Status: Abnormal   Collection Time   01/24/12  7:37 AM      Component Value Range Comment   Color, Urine YELLOW  YELLOW    APPearance CLEAR  CLEAR    Specific Gravity, Urine 1.020  1.005 - 1.030    pH 5.0  5.0 - 8.0    Glucose, UA NEGATIVE  NEGATIVE mg/dL    Hgb urine dipstick NEGATIVE  NEGATIVE    Bilirubin Urine NEGATIVE  NEGATIVE    Ketones, ur NEGATIVE  NEGATIVE mg/dL    Protein, ur NEGATIVE  NEGATIVE mg/dL    Urobilinogen, UA 1.0  0.0 - 1.0 mg/dL    Nitrite NEGATIVE  NEGATIVE    Leukocytes, UA  MODERATE (*) NEGATIVE   URINE MICROSCOPIC-ADD ON     Status: Abnormal   Collection Time   01/24/12  7:37 AM      Component Value Range Comment   Squamous Epithelial / LPF RARE  RARE    WBC, UA 11-20  <3 WBC/hpf    Bacteria, UA MANY (*) RARE   CBC WITH DIFFERENTIAL     Status: Abnormal   Collection Time   01/24/12  7:45 AM      Component Value Range Comment   WBC 13.5 (*) 4.0 - 10.5 K/uL    RBC 3.18 (*) 3.87 - 5.11 MIL/uL    Hemoglobin 8.8 (*) 12.0 - 15.0 g/dL    HCT 54.0 (*) 08.6 - 46.0 %    MCV 85.5  78.0 - 100.0 fL    MCH 27.7  26.0 - 34.0 pg    MCHC 32.4  30.0 - 36.0 g/dL    RDW 76.1  95.0 - 93.2 %    Platelets 213  150 - 400 K/uL    Neutrophils Relative 83 (*) 43 - 77 %    Neutro Abs 11.2 (*) 1.7 - 7.7 K/uL    Lymphocytes Relative 10 (*) 12 - 46 %    Lymphs Abs 1.4  0.7 - 4.0 K/uL  Monocytes Relative 5  3 - 12 %    Monocytes Absolute 0.7  0.1 - 1.0 K/uL    Eosinophils Relative 1  0 - 5 %    Eosinophils Absolute 0.2  0.0 - 0.7 K/uL    Basophils Relative 0  0 - 1 %    Basophils Absolute 0.0  0.0 - 0.1 K/uL   COMPREHENSIVE METABOLIC PANEL     Status: Abnormal   Collection Time   01/24/12  7:45 AM      Component Value Range Comment   Sodium 143  135 - 145 mEq/L    Potassium 3.9  3.5 - 5.1 mEq/L    Chloride 106  96 - 112 mEq/L    CO2 28  19 - 32 mEq/L    Glucose, Bld 89  70 - 99 mg/dL    BUN 56 (*) 6 - 23 mg/dL    Creatinine, Ser 7.84 (*) 0.50 - 1.10 mg/dL    Calcium 8.9  8.4 - 69.6 mg/dL    Total Protein 6.7  6.0 - 8.3 g/dL    Albumin 3.2 (*) 3.5 - 5.2 g/dL    AST 29  0 - 37 U/L    ALT 20  0 - 35 U/L    Alkaline Phosphatase 74  39 - 117 U/L    Total Bilirubin 0.5  0.3 - 1.2 mg/dL    GFR calc non Af Amer 36 (*) >90 mL/min    GFR calc Af Amer 42 (*) >90 mL/min   APTT     Status: Normal   Collection Time   01/24/12  7:45 AM      Component Value Range Comment   aPTT 29  24 - 37 seconds   PROTIME-INR     Status: Normal   Collection Time   01/24/12  7:45 AM       Component Value Range Comment   Prothrombin Time 14.2  11.6 - 15.2 seconds    INR 1.11  0.00 - 1.49   LIPASE, BLOOD     Status: Normal   Collection Time   01/24/12  7:45 AM      Component Value Range Comment   Lipase 26  11 - 59 U/L   OCCULT BLOOD, POC DEVICE     Status: Normal   Collection Time   01/24/12  7:53 AM      Component Value Range Comment   Fecal Occult Bld POSITIVE     SAMPLE TO BLOOD BANK     Status: Normal   Collection Time   01/24/12  8:00 AM      Component Value Range Comment   Blood Bank Specimen SAMPLE AVAILABLE FOR TESTING      Sample Expiration 01/27/2012     TYPE AND SCREEN     Status: Normal (Preliminary result)   Collection Time   01/24/12  9:19 AM      Component Value Range Comment   ABO/RH(D) O POS      Antibody Screen NEG      Sample Expiration 01/27/2012      Unit Number [id number]      Blood Component Type RED CELLS,LR      Unit division 00      Status of Unit ALLOCATED      Transfusion Status OK TO TRANSFUSE      Crossmatch Result Compatible      Unit Number [id number]      Blood Component Type RED CELLS,LR  Unit division 00      Status of Unit ALLOCATED      Transfusion Status OK TO TRANSFUSE      Crossmatch Result Compatible     ABO/RH     Status: Normal   Collection Time   01/24/12  9:19 AM      Component Value Range Comment   ABO/RH(D) O POS     PREPARE RBC (CROSSMATCH)     Status: Normal   Collection Time   01/24/12 10:00 AM      Component Value Range Comment   Order Confirmation ORDER PROCESSED BY BLOOD BANK     RETICULOCYTES     Status: Abnormal   Collection Time   01/24/12 10:11 AM      Component Value Range Comment   Retic Ct Pct 1.6  0.4 - 3.1 %    RBC. 2.80 (*) 3.87 - 5.11 MIL/uL    Retic Count, Manual 44.8  19.0 - 186.0 K/uL   CBC     Status: Abnormal   Collection Time   01/24/12 10:54 AM      Component Value Range Comment   WBC 8.6  4.0 - 10.5 K/uL    RBC 2.68 (*) 3.87 - 5.11 MIL/uL    Hemoglobin 7.5 (*)  12.0 - 15.0 g/dL    HCT 16.1 (*) 09.6 - 46.0 %    MCV 84.7  78.0 - 100.0 fL    MCH 28.0  26.0 - 34.0 pg    MCHC 33.0  30.0 - 36.0 g/dL    RDW 04.5  40.9 - 81.1 %    Platelets 182  150 - 400 K/uL   APTT     Status: Normal   Collection Time   01/24/12 10:54 AM      Component Value Range Comment   aPTT 30  24 - 37 seconds   PROTIME-INR     Status: Normal   Collection Time   01/24/12 10:54 AM      Component Value Range Comment   Prothrombin Time 14.8  11.6 - 15.2 seconds    INR 1.18  0.00 - 1.49     No results found.  ROS negative except above in fact feeling better now Blood pressure 131/61, pulse 74, temperature 98.5 F (36.9 C), temperature source Oral, resp. rate 14, height 5\' 5"  (1.651 m), weight 78.844 kg (173 lb 13.1 oz), SpO2 97.00%. Physical Exam vital signs stable afebrile no acute distress exam please see previous assessment evaluation labs were reviewed  Assessment/Plan: Upper GI bleeding in a patient on aspirin and nonsteroidals probably due to ulcer Plan: The risks benefits methods of endoscopy was discussed and we'll proceed ASAP with further workup and plans pending those findings  Maleeah Crossman E 01/24/2012, 1:11 PM

## 2012-01-24 NOTE — ED Provider Notes (Signed)
History     CSN: [id number]  Arrival date & time 01/24/12  5784   First MD Initiated Contact with Patient 01/24/12 [phone]      Chief Complaint  Patient presents with  . Nausea  . Dizziness  . Hematemesis    (Consider location/radiation/quality/duration/timing/severity/associated sxs/prior treatment) HPI  Patient reports about 3 AM she woke up and thought she smelled somebody cooking which made her get very nauseated. She states she got up and went to the bathroom and had a small bowel movement that was normal color. She states she then started vomiting and the first emesis was red. She had 3 episodes and each one was red. She denies any abdominal pain. She states she was feeling dizzy and lightheaded and felt her heart was racing. She denies any recent abdominal pain. She states currently her nausea is gone and she does not feel dizzy or lightheaded or weak. She denies eating anything read. She denies any other change in food. Her husband is present and states he feels fine. Patient states she does take Aleve off and on for pain in her leg however she states she doesn't take it every day. She's never seen a gastroenterologist. She denies any other changes in her medications, and states she takes one baby aspirin a day.  PCP Dr. Velna Hatchet    Past Medical History  Diagnosis Date  . Diabetes mellitus without complication   . Hypertension   . Hypercholesterolemia     Past Surgical History  Procedure Date  . Abdominal hysterectomy   . Goiter   . Carpal tunnel release   . Breast biopsy     No family history on file.  History  Substance Use Topics  . Smoking status: Never Smoker   . Smokeless tobacco: Not on file  . Alcohol Use: No  Lives at home Lives with spouse  OB History    Grav Para Term Preterm Abortions TAB SAB Ect Mult Living                  Review of Systems  All other systems reviewed and are negative.    Allergies  Review of patient's allergies  indicates no known allergies.  Home Medications   Current Outpatient Rx  Name  Route  Sig  Dispense  Refill  . ASPIRIN EC 81 MG PO TBEC   Oral   Take 81 mg by mouth daily.         Marland Kitchen CARVEDILOL 3.125 MG PO TABS   Oral   Take 3.125 mg by mouth 2 (two) times daily with a meal.         . VITAMIN D 2000 UNITS PO CAPS   Oral   Take 1 capsule by mouth every morning.         Marland Kitchen LOSARTAN POTASSIUM-HCTZ 50-12.5 MG PO TABS   Oral   Take 1 tablet by mouth every morning.         . ADULT MULTIVITAMIN W/MINERALS CH   Oral   Take 1 tablet by mouth daily.         Marland Kitchen ROSUVASTATIN CALCIUM 20 MG PO TABS   Oral   Take 20 mg by mouth every evening.           BP 130/72  Pulse 117  Temp 98.3 F (36.8 C)  Resp 18  Ht 5\' 5"  (1.651 m)  Wt 165 lb (74.844 kg)  BMI 27.46 kg/m2  SpO2 96%  Vital signs normal except  tachycardia  Orthostatic VS are + for tachycardia   Physical Exam  Nursing note and vitals reviewed. Constitutional: She is oriented to person, place, and time. She appears well-developed and well-nourished.  Non-toxic appearance. She does not appear ill. No distress.  HENT:  Head: Normocephalic and atraumatic.  Right Ear: External ear normal.  Left Ear: External ear normal.  Nose: Nose normal. No mucosal edema or rhinorrhea.  Mouth/Throat: Oropharynx is clear and moist and mucous membranes are normal. No dental abscesses or uvula swelling.  Eyes: Conjunctivae normal and EOM are normal. Pupils are equal, round, and reactive to light.  Neck: Normal range of motion and full passive range of motion without pain. Neck supple.  Cardiovascular: Normal rate, regular rhythm and normal heart sounds.  Exam reveals no gallop and no friction rub.   No murmur heard. Pulmonary/Chest: Effort normal and breath sounds normal. No respiratory distress. She has no wheezes. She has no rhonchi. She has no rales. She exhibits no tenderness and no crepitus.  Abdominal: Soft. Normal  appearance and bowel sounds are normal. She exhibits no distension. There is no tenderness. There is no rebound and no guarding.  Genitourinary:       Old hemorrhoids, stool swab appears grossly red  Musculoskeletal: Normal range of motion. She exhibits no edema and no tenderness.       Moves all extremities well.   Neurological: She is alert and oriented to person, place, and time. She has normal strength. No cranial nerve deficit.  Skin: Skin is warm, dry and intact. No rash noted. No erythema. No pallor.  Psychiatric: She has a normal mood and affect. Her speech is normal and behavior is normal. Her mood appears not anxious.    ED Course  Procedures (including critical care time)  Medications  0.9 %  sodium chloride infusion (1000 mL Intravenous New Bag/Given 01/24/12 0842)    Followed by  0.9 %  sodium chloride infusion (not administered)  ondansetron (ZOFRAN) injection 4 mg (4 mg Intravenous Given 01/24/12 0809)  acetaminophen (TYLENOL) tablet 1,000 mg (1000 mg Oral Given 01/24/12 1610)   07:55 patient now complaining of nausea and a headache. She also was requesting to [facility] for chronic intermittent pain in her leg. She was given tylenol for pain.   Patient given 1 L of normal saline for her tachycardia with her orthostatic vital signs. She also was typed and crossed and prepared for blood transfusion.  I discussed admission with the patient she is agreeable. We also discussed that the most likely etiology is from her f aspirin/nonsteroidal anti-inflammatory drugs.  09:15 Dr Radonna Ricker, admit to team 5, tele, call GI  09:25 Dr Ewing Schlein, states he has two procedures at [facility] estimates he will do EDG around noon-1 pm, keep NPO  Results for orders placed during the hospital encounter of 01/24/12  CBC WITH DIFFERENTIAL      Component Value Range   WBC 13.5 (*) 4.0 - 10.5 K/uL   RBC 3.18 (*) 3.87 - 5.11 MIL/uL   Hemoglobin 8.8 (*) 12.0 - 15.0 g/dL   HCT 96.0 (*) 45.4 - 09.8 %   MCV 85.5   78.0 - 100.0 fL   MCH 27.7  26.0 - 34.0 pg   MCHC 32.4  30.0 - 36.0 g/dL   RDW 11.9  14.7 - 82.9 %   Platelets 213  150 - 400 K/uL   Neutrophils Relative 83 (*) 43 - 77 %   Neutro Abs 11.2 (*) 1.7 - 7.7 K/uL  Lymphocytes Relative 10 (*) 12 - 46 %   Lymphs Abs 1.4  0.7 - 4.0 K/uL   Monocytes Relative 5  3 - 12 %   Monocytes Absolute 0.7  0.1 - 1.0 K/uL   Eosinophils Relative 1  0 - 5 %   Eosinophils Absolute 0.2  0.0 - 0.7 K/uL   Basophils Relative 0  0 - 1 %   Basophils Absolute 0.0  0.0 - 0.1 K/uL  SAMPLE TO BLOOD BANK      Component Value Range   Blood Bank Specimen SAMPLE AVAILABLE FOR TESTING     Sample Expiration 01/27/2012    COMPREHENSIVE METABOLIC PANEL      Component Value Range   Sodium 143  135 - 145 mEq/L   Potassium 3.9  3.5 - 5.1 mEq/L   Chloride 106  96 - 112 mEq/L   CO2 28  19 - 32 mEq/L   Glucose, Bld 89  70 - 99 mg/dL   BUN 56 (*) 6 - 23 mg/dL   Creatinine, Ser 1.61 (*) 0.50 - 1.10 mg/dL   Calcium 8.9  8.4 - 09.6 mg/dL   Total Protein 6.7  6.0 - 8.3 g/dL   Albumin 3.2 (*) 3.5 - 5.2 g/dL   AST 29  0 - 37 U/L   ALT 20  0 - 35 U/L   Alkaline Phosphatase 74  39 - 117 U/L   Total Bilirubin 0.5  0.3 - 1.2 mg/dL   GFR calc non Af Amer 36 (*) >90 mL/min   GFR calc Af Amer 42 (*) >90 mL/min  APTT      Component Value Range   aPTT 29  24 - 37 seconds  PROTIME-INR      Component Value Range   Prothrombin Time 14.2  11.6 - 15.2 seconds   INR 1.11  0.00 - 1.49  LIPASE, BLOOD      Component Value Range   Lipase 26  11 - 59 U/L  URINALYSIS, ROUTINE W REFLEX MICROSCOPIC      Component Value Range   Color, Urine YELLOW  YELLOW   APPearance CLEAR  CLEAR   Specific Gravity, Urine 1.020  1.005 - 1.030   pH 5.0  5.0 - 8.0   Glucose, UA NEGATIVE  NEGATIVE mg/dL   Hgb urine dipstick NEGATIVE  NEGATIVE   Bilirubin Urine NEGATIVE  NEGATIVE   Ketones, ur NEGATIVE  NEGATIVE mg/dL   Protein, ur NEGATIVE  NEGATIVE mg/dL   Urobilinogen, UA 1.0  0.0 - 1.0 mg/dL    Nitrite NEGATIVE  NEGATIVE   Leukocytes, UA MODERATE (*) NEGATIVE  OCCULT BLOOD, POC DEVICE      Component Value Range   Fecal Occult Bld POSITIVE    URINE MICROSCOPIC-ADD ON      Component Value Range   Squamous Epithelial / LPF RARE  RARE   WBC, UA 11-20  <3 WBC/hpf   Bacteria, UA MANY (*) RARE  TYPE AND SCREEN      Component Value Range   ABO/RH(D) O POS     Antibody Screen PENDING     Sample Expiration 01/27/2012     Laboratory interpretation all normal except poss UTI, anemia from baseline 3 years ago of 11.2, stable renal insufficiency however her BUN is much higher than her baseline of 27 consistent with GI bleeding, positive Hemoccult, leukocytosis    1. GI bleeding   2. Hematemesis   3. Anemia   4. Renal insufficiency    Plan admission  Devoria Albe, MD, FACEP  CRITICAL CARE Performed by: Ward Givens   Total critical care time: 37 minutes  Critical care time was exclusive of separately billable procedures and treating other patients.  Critical care was necessary to treat or prevent imminent or life-threatening deterioration.  Critical care was time spent personally by me on the following activities: development of treatment plan with patient and/or surrogate as well as nursing, discussions with consultants, evaluation of patient's response to treatment, examination of patient, obtaining history from patient or surrogate, ordering and performing treatments and interventions, ordering and review of laboratory studies, ordering and review of radiographic studies, pulse oximetry and re-evaluation of patient's condition.    MDM          Ward Givens, MD 01/24/12 [phone]

## 2012-01-24 NOTE — Progress Notes (Signed)
Patient requesting advanced diet.  Tolerated clear liquids well.

## 2012-01-24 NOTE — H&P (Signed)
[facility] History and Physical  Alison Gross ZOX:[id number] DOB: [birth year] DOA: 01/24/2012  Referring physician: Dr. Lynelle Doctor PCP: No primary provider on file.  Specialists: none  Chief Complaint: hematemesis  HPI: Alison Gross is a 78 y.o. female  Past medical history of hypertension, and osteoarthritis currently taking read that comes in for hematemesis on the morning of admission. She relates she will call felt nauseated and threw up. She relates it was bright red and after that had ongoing nausea. She denies any melanotic stools. She also relates dizziness upon standing every single time this morning. Denies any abdominal pain. She relates she takes Aleve for her left knee osteoarthritis frequently. She also relates that this week she took a couple of extra aspirin as her left knee is bothering her. She denies any alcohol abuse and she denies any smoking. Per ED rectal exam was grossly bloody.   Review of Systems: The patient denies anorexia, fever, weight loss,, vision loss, decreased hearing, hoarseness, chest pain, syncope, dyspnea on exertion, peripheral edema, balance deficits, hemoptysis, abdominal pain, , hematuria, incontinence, genital sores, muscle weakness, suspicious skin lesions, transient blindness, difficulty walking, depression, unusual weight change, abnormal bleeding, enlarged lymph nodes, angioedema, and breast masses.    Past Medical History  Diagnosis Date  . Diabetes mellitus without complication   . Hypertension   . Hypercholesterolemia    Past Surgical History  Procedure Date  . Abdominal hysterectomy   . Goiter   . Carpal tunnel release   . Breast biopsy    Social History:  reports that she has quit smoking. Her smoking use included Cigarettes. She smoked .5 packs per day. She does not have any smokeless tobacco history on file. She reports that she does not drink alcohol or use illicit drugs.  lives at home by herself can perform all her  ADLs  No Known Allergies  Family History  Problem Relation Age of Onset  . Brain cancer Mother   . Aneurysm Father     (be sure to complete)  Prior to Admission medications   Medication Sig Start Date End Date Taking? Authorizing Provider  aspirin EC 81 MG tablet Take 81 mg by mouth daily.   Yes Historical Provider, MD  carvedilol (COREG) 3.125 MG tablet Take 3.125 mg by mouth 2 (two) times daily with a meal.   Yes Historical Provider, MD  Cholecalciferol (VITAMIN D) 2000 UNITS CAPS Take 1 capsule by mouth every morning.   Yes Historical Provider, MD  losartan-hydrochlorothiazide (HYZAAR) 50-12.5 MG per tablet Take 1 tablet by mouth every morning.   Yes Historical Provider, MD  Multiple Vitamin (MULTIVITAMIN WITH MINERALS) TABS Take 1 tablet by mouth daily.   Yes Historical Provider, MD  rosuvastatin (CRESTOR) 20 MG tablet Take 20 mg by mouth every evening.   Yes Historical Provider, MD   Physical Exam: Filed Vitals:   01/24/12 0753 01/24/12 0800 01/24/12 0815 01/24/12 0830  BP:      Pulse:  90 94 86  Temp:      Resp: 15 14 22 19   Height:      Weight:      SpO2: 97% 99% 98% 99%     General:  Awake alert and oriented x3 no acute distress  Eyes:  Anicteric  ENT:  Moist mucous membranes  Neck:  no JVD  Cardiovascular:  regular rate and rhythm with positive S1 and S2  Respiratory:  good air movement and clear to auscultation  Abdomen:  positive bowel sounds  nontender nondistended and soft  Skin:  no rashes ulcerations  Musculoskeletal:  intact   Psychiatric:  appropriate  Neurologic: NONE FOCAL  Labs on Admission:  Basic Metabolic Panel:  Lab 01/24/12 1610  NA 143  K 3.9  CL 106  CO2 28  GLUCOSE 89  BUN 56*  CREATININE 1.37*  CALCIUM 8.9  MG --  PHOS --   Liver Function Tests:  Lab 01/24/12 0745  AST 29  ALT 20  ALKPHOS 74  BILITOT 0.5  PROT 6.7  ALBUMIN 3.2*    Lab 01/24/12 0745  LIPASE 26  AMYLASE --   No results found for this  basename: AMMONIA:5 in the last 168 hours CBC:  Lab 01/24/12 0745  WBC 13.5*  NEUTROABS 11.2*  HGB 8.8*  HCT 27.2*  MCV 85.5  PLT 213   Cardiac Enzymes: No results found for this basename: CKTOTAL:5,CKMB:5,CKMBINDEX:5,TROPONINI:5 in the last 168 hours  BNP (last 3 results) No results found for this basename: PROBNP:3 in the last 8760 hours CBG: No results found for this basename: GLUCAP:5 in the last 168 hours  Radiological Exams on Admission: No results found.  EKG: Independently reviewed. none  Assessment/Plan Principal Problem:  Acute GI bleeding: -This most likely secondary to the use of NSAIDs including aspirin. We'll go ahead and hold these. We'll go ahead and placed 2 peripheral IV lines, type and cross 2 units, transfuse one as she is clinically symptomatic. She does describe orthostasis. We'll start her on Protonix  twice a day IV. We'll place her n.p.o. except for medications. We'll call GI for possible endoscopy. We'll go ahead and check an anemia panel before transfusion.  Acute blood loss anemia: -most likely 2/2 to above. -anemia panel before transfusion. - Her hbg will probably drop after IV fluids, continue to monitor. Keep >7.0 or transfuse if continues to be symptomatic.    HTN (hypertension): -hold all meds except for Beta blocker.   OA (osteoarthritis) of knee -avoids NSAID's -narcotics PRN.   Code Status: full Family Communication: daughter Disposition Plan: home 1-2 days (indicate anticipated LOS)  Time spent: 50 minutes  Marinda Elk [facility] Pager [phone]  If 7PM-7AM, please contact night-coverage [email or web address] Password [email or web address] 01/24/2012, 9:59 AM

## 2012-01-24 NOTE — ED Notes (Signed)
Assumed care of pt at shift change from Alison Gross, Charity fundraiser.  Pt c/o N/V and dizziness beginning @ 6 am today.  Pt states she vomited x3 with bright red blood visible in vomitus.  Pt denies dark tarry stools or "anything abnormal" in her BM's.  Pt states she is currently nauseated and dizzy upon standing.  Pt also c/o pain in her left leg/hip, radiating to the knee.  Pt states this is not a new pain and she has been treating it at home with Aleve.  Pt is A&O x4 and ambulatory without assistance (but monitored).  Pt placed on cardiac monitor.  Pt in NSR with HR of 93 currently.

## 2012-01-24 NOTE — ED Notes (Signed)
Report called to [facility] on 4w.

## 2012-01-24 NOTE — Progress Notes (Signed)
Educated patient about risks/benefits of blood transfusion.

## 2012-01-24 NOTE — ED Notes (Signed)
Attempted to call report to [facility].  She is in a pt's room and will return call.

## 2012-01-25 LAB — BASIC METABOLIC PANEL
BUN: 46 mg/dL — ABNORMAL HIGH (ref 6–23)
Chloride: 109 mEq/L (ref 96–112)
Creatinine, Ser: 1.31 mg/dL — ABNORMAL HIGH (ref 0.50–1.10)
Glucose, Bld: 94 mg/dL (ref 70–99)
Potassium: 3.7 mEq/L (ref 3.5–5.1)

## 2012-01-25 LAB — CBC
HCT: 26.5 % — ABNORMAL LOW (ref 36.0–46.0)
Hemoglobin: 8.6 g/dL — ABNORMAL LOW (ref 12.0–15.0)
MCH: 27.8 pg (ref 26.0–34.0)
MCHC: 32.5 g/dL (ref 30.0–36.0)

## 2012-01-25 MED ORDER — HYDROCODONE-ACETAMINOPHEN 5-325 MG PO TABS: 1.0000 | ORAL_TABLET | ORAL | Status: DC | PRN

## 2012-01-25 MED ORDER — PANTOPRAZOLE SODIUM 40 MG PO TBEC: 40.0000 mg | DELAYED_RELEASE_TABLET | Freq: Two times a day (BID) | ORAL | Status: DC

## 2012-01-25 MED ORDER — LINAGLIPTIN 5 MG PO TABS
5.0000 mg | ORAL_TABLET | Freq: Every day | ORAL | Status: DC
  Administered 2012-01-25 – 2012-01-26 (×2): 5 mg via ORAL
  Filled 2012-01-25 (×2): qty 1

## 2012-01-25 NOTE — Progress Notes (Signed)
Pt had medium sized BM, half was brown and the other half was black in toilet; no bright red blood seen.  Alison Gross, Ok Edwards RN

## 2012-01-25 NOTE — Progress Notes (Signed)
[name] 10:28 AM  Subjective: The patient has no GI complaints no further bleeding and is eating regular food and feels better  Objective: Vital signs stable afebrile no acute distress abdomen is soft nontender hemoglobin bouncing around BUN slightly decreased  Assessment: Upper GI bleed secondary to ulcers and erosions secondary to aspirin and nonsteroidals Plan: She will call me later in the week for her biopsies to rule out Helicobacter stay on pump inhibitors long-term and she will need a new pain pill for her arthritis and will not increase her risk of bleeding and we are happy to see back when necessary and please call us if we can be of any further assistance  [facility]

## 2012-01-25 NOTE — Progress Notes (Signed)
[facility] PROGRESS NOTE  Alison Gross ZOX:[id number] DOB: 09/21/1932 DOA: 01/24/2012 PCP: No primary provider on file.  Assessment/Plan: 1.  Acute GI bleeding:  -This most likely secondary to the use of NSAIDs including aspirin. S/p prbc transfusion and EGD last night found to have stomach ulcer with minimalbleed. Have started on protonix. Cultures sent for H Pylori.   Acute blood loss anemia:  -most likely 2/2 to above.  -anemia panel before transfusion.  - Her hbg will probably drop after IV fluids, continue to monitor. Keep >7.0 or transfuse if continues to be symptomatic.   HTN (hypertension):  -hold all meds except for Beta blocker.  OA (osteoarthritis) of knee  -avoids NSAID's  -narcotics PRN.   Consultants: GI consult Procedures:  EGD last night  HPI/Subjective: Comfortable no new complaints.   Objective: Filed Vitals:   01/24/12 1800 01/24/12 2256 01/25/12 0514 01/25/12 1430  BP: 118/52 99/47 111/53 110/60  Pulse: 66 70 64 78  Temp: 97.5 F (36.4 C) 98.2 F (36.8 C) 98.3 F (36.8 C) 98.3 F (36.8 C)  TempSrc:  Oral Oral Oral  Resp: 18 18 18 18   Height:      Weight:      SpO2:  98% 100% 100%    Intake/Output Summary (Last 24 hours) at 01/25/12 1913 Last data filed at 01/25/12 1800  Gross per 24 hour  Intake    400 ml  Output      0 ml  Net    400 ml   Filed Weights   01/24/12 0635 01/24/12 1034  Weight: 74.844 kg (165 lb) 78.844 kg (173 lb 13.1 oz)    Exam:    General: Awake alert and oriented x3 no acute distress                                                              Cardiovascular: regular rate and rhythm with positive S1 and S2  Respiratory: good air movement and clear to auscultation  Abdomen: positive bowel sounds nontender nondistended and soft  Skin: no rashes ulcerations  Musculoskeletal: intact   Data Reviewed: Basic Metabolic Panel:  Lab 01/25/12 8119 01/24/12 0745  NA 141 143  K 3.7 3.9  CL 109 106  CO2 22  28  GLUCOSE 94 89  BUN 46* 56*  CREATININE 1.31* 1.37*  CALCIUM 8.6 8.9  MG -- --  PHOS -- --   Liver Function Tests:  Lab 01/24/12 0745  AST 29  ALT 20  ALKPHOS 74  BILITOT 0.5  PROT 6.7  ALBUMIN 3.2*    Lab 01/24/12 0745  LIPASE 26  AMYLASE --   No results found for this basename: AMMONIA:5 in the last 168 hours CBC:  Lab 01/25/12 1605 01/25/12 0511 01/24/12 2238 01/24/12 1948 01/24/12 1054 01/24/12 0745  WBC -- 7.5 7.3 18.3* 8.6 13.5*  NEUTROABS -- -- -- -- -- 11.2*  HGB 7.8* 8.6* 7.9* 11.0* 7.5* --  HCT 23.5* 26.5* 23.6* 33.0* 22.7* --  MCV -- 85.8 85.2 92.4 84.7 85.5  PLT -- 163 161 328 182 213   Cardiac Enzymes: No results found for this basename: CKTOTAL:5,CKMB:5,CKMBINDEX:5,TROPONINI:5 in the last 168 hours BNP (last 3 results) No results found for this basename: PROBNP:3 in the last 8760 hours CBG: No results found  for this basename: GLUCAP:5 in the last 168 hours  No results found for this or any previous visit (from the past 240 hour(s)).   Studies: No results found.  Scheduled Meds:   . atorvastatin  40 mg Oral q1800  . carvedilol  3.125 mg Oral BID WC  . linagliptin  5 mg Oral Daily  . multivitamin with minerals  1 tablet Oral Daily  . pantoprazole  40 mg Oral BID  . sodium chloride  3 mL Intravenous Q12H   Continuous Infusions:   . sodium chloride 1,000 mL (01/25/12 1845)  . [DISCONTINUED] sodium chloride 100 mL/hr at 01/24/12 1800  . [DISCONTINUED] sodium chloride Stopped (01/24/12 1437)    Principal Problem:  *Acute upper GI bleeding Active Problems:  Acute blood loss anemia  HTN (hypertension)  OA (osteoarthritis) of knee        Alison Gross  [facility] Pager [phone]. If 8PM-8AM, please contact night-coverage at [email or web address], password [facility] 01/25/2012, 7:13 PM  LOS: 1 day

## 2012-01-25 NOTE — Progress Notes (Signed)
Patient wants to know why she isn't taking her diabetes pill.  Patient checks her blood sugar once a day at home. Will follow-up with Md today.

## 2012-01-25 NOTE — Progress Notes (Signed)
Nutrition Brief Note  Patient identified on the Malnutrition Screening Tool (MST) Report  Body mass index is 28.93 kg/(m^2). Pt meets criteria for overweight based on current BMI.   Current diet order is Regular, patient is consuming approximately 80-100% of meals at this time. Labs and medications reviewed.   No weight loss reported by patient.   No nutrition interventions warranted at this time. If nutrition issues arise, please consult RD.   Linnell Fulling, RD, LDN Pager #: [phone] After-Hours Pager #: [phone]

## 2012-01-26 ENCOUNTER — Encounter (HOSPITAL_COMMUNITY): Payer: Self-pay | Admitting: Gastroenterology

## 2012-01-26 DIAGNOSIS — N289 Disorder of kidney and ureter, unspecified: Secondary | ICD-10-CM

## 2012-01-26 LAB — CBC
MCH: 28.4 pg (ref 26.0–34.0)
MCHC: 33.5 g/dL (ref 30.0–36.0)
MCV: 84.7 fL (ref 78.0–100.0)
Platelets: 152 10*3/uL (ref 150–400)
RDW: 14.7 % (ref 11.5–15.5)

## 2012-01-26 LAB — URINE CULTURE: Colony Count: >=100000

## 2012-01-26 MED ORDER — CIPROFLOXACIN HCL 500 MG PO TABS: 500.0000 mg | ORAL_TABLET | Freq: Two times a day (BID) | ORAL | Status: DC

## 2012-01-26 NOTE — Discharge Summary (Signed)
Physician Discharge Summary  Alison Gross AVW:[id number] DOB: Feb 16, 1933 DOA: 01/24/2012  PCP: No primary provider on file.  Admit date: 01/24/2012 Discharge date: 01/26/2012  Time spent: 45 minutes  Recommendations for Outpatient Follow-up:  1. Follow up with pcp in one week. And check CBC in one week.  2. Follow up with GI regarding culture reports.  Discharge Diagnoses:  Principal Problem:  *Acute upper GI bleeding Active Problems:  Acute blood loss anemia  HTN (hypertension)  OA (osteoarthritis) of knee UTI  Discharge Condition: STABLE  Diet recommendation: low salt diet.   Filed Weights   01/24/12 0635 01/24/12 1034  Weight: 74.844 kg (165 lb) 78.844 kg (173 lb 13.1 oz)    History of present illness:   Alison Gross is a 78 y.o. female  Past medical history of hypertension, and osteoarthritis currently taking read that comes in for hematemesis on the morning of admission. She relates she will call felt nauseated and threw up. She relates it was bright red and after that had ongoing nausea. She denies any melanotic stools. She also relates dizziness upon standing every single time this morning. Denies any abdominal pain. She relates she takes Aleve for her left knee osteoarthritis frequently. She also relates that this week she took a couple of extra aspirin as her left knee is bothering her. She denies any alcohol abuse and she denies any smoking.  Hospital Course:  Acute GI bleeding:  -This most likely secondary to the use of NSAIDs including aspirin. S/p prbc transfusion and EGD  found to have stomach ulcer with minimalbleed. Have started on protonix. Cultures sent for H Pylori. His hemoglobin dropped a little from 8.6 to 7.8 to 7.6. Hence he got one unit of prbc transfusion before discharge.  Acute blood loss anemia:  -most likely 2/2 to above.  - s/p 2 units of prbc transfusion.   HTN (hypertension):  - better controlled. Resume home medications.  OA  (osteoarthritis) of knee  -avoids NSAID's  -narcotics PRN.  Urinary Tract Infection: - pt is asymptomatic - ciprofloxacin ordered.    Consultations:  GI consult  Discharge Exam: Filed Vitals:   01/26/12 1645 01/26/12 1715 01/26/12 1815 01/26/12 2035  BP: 139/55 155/58 151/58 129/55  Pulse: 66 64 71 75  Temp: 98 F (36.7 C) 97.5 F (36.4 C) 98.3 F (36.8 C) 98 F (36.7 C)  TempSrc: Oral Oral Oral Oral  Resp: 16 18 18 18   Height:      Weight:      SpO2:        General: alert afebrile comfortable Cardiovascular: s1s2 Respiratory: CTAB Abdomen soft NT ND BS+ Extremities: no pedal edema.   Discharge Instructions      Discharge Orders    Future Orders Please Complete By Expires   Diet - low sodium heart healthy      Activity as tolerated - No restrictions          Medication List     As of 01/26/2012 10:52 PM    STOP taking these medications         aspirin EC 81 MG tablet      TAKE these medications         carvedilol 3.125 MG tablet   Commonly known as: COREG   Take 3.125 mg by mouth 2 (two) times daily with a meal.      ciprofloxacin 500 MG tablet   Commonly known as: CIPRO   Take 1 tablet (500 mg total) by  mouth 2 (two) times daily.      HYDROcodone-acetaminophen 5-325 MG per tablet   Commonly known as: NORCO/VICODIN   Take 1-2 tablets by mouth every 4 (four) hours as needed.      losartan-hydrochlorothiazide 50-12.5 MG per tablet   Commonly known as: HYZAAR   Take 1 tablet by mouth every morning.      multivitamin with minerals Tabs   Take 1 tablet by mouth daily.      pantoprazole 40 MG tablet   Commonly known as: PROTONIX   Take 1 tablet (40 mg total) by mouth 2 (two) times daily.      rosuvastatin 20 MG tablet   Commonly known as: CRESTOR   Take 20 mg by mouth every evening.      Vitamin D 2000 UNITS Caps   Take 1 capsule by mouth every morning.           The results of significant diagnostics from this hospitalization  (including imaging, microbiology, ancillary and laboratory) are listed below for reference.    Significant Diagnostic Studies: No results found.  Microbiology: Recent Results (from the past 240 hour(s))  URINE CULTURE     Status: Normal   Collection Time   01/24/12  7:37 AM      Component Value Range Status Comment   Specimen Description URINE, CLEAN CATCH   Final    Special Requests NONE   Final    Culture  Setup Time 01/24/2012 15:03   Final    Colony Count >=100,000 COLONIES/ML   Final    Culture ESCHERICHIA COLI   Final    Report Status 01/26/2012 FINAL   Final    Organism ID, Bacteria ESCHERICHIA COLI   Final      Labs: Basic Metabolic Panel:  Lab 01/25/12 1610 01/24/12 0745  NA 141 143  K 3.7 3.9  CL 109 106  CO2 22 28  GLUCOSE 94 89  BUN 46* 56*  CREATININE 1.31* 1.37*  CALCIUM 8.6 8.9  MG -- --  PHOS -- --   Liver Function Tests:  Lab 01/24/12 0745  AST 29  ALT 20  ALKPHOS 74  BILITOT 0.5  PROT 6.7  ALBUMIN 3.2*    Lab 01/24/12 0745  LIPASE 26  AMYLASE --   No results found for this basename: AMMONIA:5 in the last 168 hours CBC:  Lab 01/26/12 1020 01/25/12 1605 01/25/12 0511 01/24/12 2238 01/24/12 1948 01/24/12 1054 01/24/12 0745  WBC 6.4 -- 7.5 7.3 18.3* 8.6 --  NEUTROABS -- -- -- -- -- -- 11.2*  HGB 7.6* 7.8* 8.6* 7.9* 11.0* -- --  HCT 22.7* 23.5* 26.5* 23.6* 33.0* -- --  MCV 84.7 -- 85.8 85.2 92.4 84.7 --  PLT 152 -- 163 161 328 182 --   Cardiac Enzymes: No results found for this basename: CKTOTAL:5,CKMB:5,CKMBINDEX:5,TROPONINI:5 in the last 168 hours BNP: BNP (last 3 results) No results found for this basename: PROBNP:3 in the last 8760 hours CBG: No results found for this basename: GLUCAP:5 in the last 168 hours     Signed:  Miryam Mcelhinney  [facility] 01/26/2012, 10:52 PM

## 2012-01-26 NOTE — Progress Notes (Addendum)
Patient was waiting for her unit of blood to finish before being discharged. The day shift nurse already went over the discharge papers and the patient stated she did not have any questions. Patient was safely taken to her car in a wheelchair by the nurse tech with her husband.

## 2012-01-26 NOTE — Clinical Documentation Improvement (Signed)
Abnormal Labs Clarification  THIS DOCUMENT IS NOT A PERMANENT PART OF THE MEDICAL RECORD  TO RESPOND TO THE THIS QUERY, FOLLOW THE INSTRUCTIONS BELOW:  1. If needed, update documentation for the patient's encounter via the notes activity.  2. Access this query again and click edit on the [employer].  3. After updating, or not, click F2 to complete all highlighted (required) fields concerning your review. Select "additional documentation in the medical record" OR "no additional documentation provided".  4. Click Sign note button.  5. The deficiency will fall out of your InBasket *Please let us know if you are not able to complete this workflow by phone or e-mail (listed below).  Please update your documentation within the medical record to reflect your response to this query.                                                                                   01/26/12  Dear Dr. Blake Divine, Seth Bake Marton Redwood  In a better effort to capture your patient's severity of illness, reflect appropriate length of stay and utilization of resources, a review of the medical record has revealed the following indicators.    Based on your clinical judgment, please clarify and document in a progress note and/or discharge summary the clinical condition associated with the following supporting information:  In responding to this query please exercise your independent judgment.  The fact that a query is asked, does not imply that any particular answer is desired or expected.  Abnormal findings (laboratory, x-ray, pathologic, and other diagnostic results) are not coded and reported unless the physician indicates their clinical significance.   The medical record reflects the following clinical findings, please clarify the diagnostic and/or clinical significance:      Based on your clinical judgment can you provide a diagnosis that represents the below listed clinical indicators?    In this patient admitted with  Acute GIB and ABLA a review of the medical record reveals the following:  Abnormal UA (01/24/16)  Leukocytes=Moderate   Urine Culture (01/24/16)  >100,000 E. Coli  Treatment: Monitoring  Clarification Needed:  Please document the diagnosis/condition and causative organism (if known) responsible for the abnormal lab value in in the medical records   Possible Clinical Conditions?                                    Other Condition___________________                Cannot Clinically Determine_________      Supporting Information:  Risk Factors GIB, ABLA, HTN, OA, DM, Hypercholesterolemia  Diagnostics: Component     Latest Ref Rng 01/24/2012         7:37 AM  Leukocytes, UA     NEGATIVE MODERATE (A)   Component     Latest Ref Rng 01/24/2012         7:37 AM  Colony Count      >=100,000 COLONIES/ML  Culture      ESCHERICHIA COLI  Report Status      01/26/2012 FINAL  Organism ID, Bacteria  ESCHERICHIA COLI      Reviewed: additional documentation in the medical record ljh   Thank You,  Enis Slipper  RN, BSN, MSN/Inf, CCDS Clinical Documentation Specialist [facility] Pager: [phone] / E-mail: [email or web address]  [facility] Information Management [place]

## 2012-01-28 LAB — TYPE AND SCREEN
Antibody Screen: NEGATIVE
Unit division: 0

## 2012-03-23 ENCOUNTER — Other Ambulatory Visit: Payer: Self-pay | Admitting: Internal Medicine

## 2012-03-23 DIAGNOSIS — Z1231 Encounter for screening mammogram for malignant neoplasm of breast: Secondary | ICD-10-CM

## 2012-03-25 ENCOUNTER — Ambulatory Visit
Admission: RE | Admit: 2012-03-25 | Discharge: 2012-03-25 | Disposition: A | Discharge status: Home / Self Care | Payer: Medicare Other | Source: Ambulatory Visit | Attending: Internal Medicine | Admitting: Internal Medicine

## 2012-03-25 DIAGNOSIS — Z1231 Encounter for screening mammogram for malignant neoplasm of breast: Secondary | ICD-10-CM

## 2012-03-31 ENCOUNTER — Other Ambulatory Visit: Payer: Self-pay | Admitting: Internal Medicine

## 2012-03-31 ENCOUNTER — Ambulatory Visit (INDEPENDENT_AMBULATORY_CARE_PROVIDER_SITE_OTHER): Payer: BC Managed Care – HMO | Admitting: Family Medicine

## 2012-03-31 VITALS — BP 152/78 | HR 110 | Temp 98.7°F | Resp 17 | Ht 65.5 in | Wt 159.0 lb

## 2012-03-31 DIAGNOSIS — R928 Other abnormal and inconclusive findings on diagnostic imaging of breast: Secondary | ICD-10-CM

## 2012-03-31 DIAGNOSIS — H6120 Impacted cerumen, unspecified ear: Secondary | ICD-10-CM

## 2012-03-31 DIAGNOSIS — R059 Cough, unspecified: Secondary | ICD-10-CM

## 2012-03-31 DIAGNOSIS — J029 Acute pharyngitis, unspecified: Secondary | ICD-10-CM

## 2012-03-31 DIAGNOSIS — R05 Cough: Secondary | ICD-10-CM

## 2012-03-31 MED ORDER — BENZONATATE 100 MG PO CAPS: ORAL_CAPSULE | ORAL | Status: DC

## 2012-03-31 MED ORDER — GUAIFENESIN-CODEINE 100-10 MG/5ML PO SYRP: ORAL_SOLUTION | ORAL | Status: DC

## 2012-03-31 NOTE — Progress Notes (Signed)
Subjective: 79 year old lady who has been ill for couple days. No known fever. She has right ear that is bothering her because it is stuffed up. Her throat has been sore. She has some drainage down her throat and is been some yellowish mucus. She has had some cough. She's had ever ears washed out a lot in the past. She gets a swishing noise in her right ear. Patient inquired as to whether Zithromax to be helpful to her.  Objective TMs normal on the left, occluded with cerumen on the right. Nose has some sniffles. Throat is mildly erythematous. No exudate. Strep screen was taken. Neck supple without significant nodes. Chest is clear.  Assessment: Pharyngitis URI Cerumen impaction right ear  Plan: Irrigate ear Strep screen   Removal of cerumen: The ear was curetted and irrigated and repeatedly until I finally curetted out a large plug of wax out of the right ear. I did the procedure. Patient tolerated well. TMs normal.  Results for orders placed in visit on 03/31/12  POCT RAPID STREP A (OFFICE)      Component Value Range   Rapid Strep A Screen Negative  Negative   URI symptomatically.

## 2012-03-31 NOTE — Patient Instructions (Signed)
Every 3-4 months, used the Debrox drops in your ear to try and keep the wax from building up.  Drink plenty of fluids and get enough rest  If the cold or cough gets worse please return  Upper Respiratory Infection, Adult An upper respiratory infection (URI) is also known as the common cold. It is often caused by a type of germ (virus). Colds are easily spread (contagious). You can pass it to others by kissing, coughing, sneezing, or drinking out of the same glass. Usually, you get better in 1 or 2 weeks.  HOME CARE   Only take medicine as told by your doctor.  Use a warm mist humidifier or breathe in steam from a hot shower.  Drink enough water and fluids to keep your pee (urine) clear or pale yellow.  Get plenty of rest.  Return to work when your temperature is back to normal or as told by your doctor. You may use a face mask and wash your hands to stop your cold from spreading. GET HELP RIGHT AWAY IF:   After the first few days, you feel you are getting worse.  You have questions about your medicine.  You have chills, shortness of breath, or brown or red spit (mucus).  You have yellow or brown snot (nasal discharge) or pain in the face, especially when you bend forward.  You have a fever, puffy (swollen) neck, pain when you swallow, or white spots in the back of your throat.  You have a bad headache, ear pain, sinus pain, or chest pain.  You have a high-pitched whistling sound when you breathe in and out (wheezing).  You have a lasting cough or cough up blood.  You have sore muscles or a stiff neck. MAKE SURE YOU:   Understand these instructions.  Will watch your condition.  Will get help right away if you are not doing well or get worse. Document Released: 08/13/2007 Document Revised: 05/19/2011 Document Reviewed: 07/01/2010 [facility] Patient Information 2013 [place], [place].

## 2012-04-12 ENCOUNTER — Ambulatory Visit
Admission: RE | Admit: 2012-04-12 | Discharge: 2012-04-12 | Disposition: A | Discharge status: Home / Self Care | Payer: Medicare Other | Source: Ambulatory Visit | Attending: Internal Medicine | Admitting: Internal Medicine

## 2012-04-12 ENCOUNTER — Other Ambulatory Visit: Payer: Self-pay | Admitting: Internal Medicine

## 2012-04-12 DIAGNOSIS — R928 Other abnormal and inconclusive findings on diagnostic imaging of breast: Secondary | ICD-10-CM

## 2012-04-12 DIAGNOSIS — C50919 Malignant neoplasm of unspecified site of unspecified female breast: Secondary | ICD-10-CM

## 2012-04-12 HISTORY — DX: Malignant neoplasm of unspecified site of unspecified female breast: C50.919

## 2012-04-13 ENCOUNTER — Ambulatory Visit
Admission: RE | Admit: 2012-04-13 | Discharge: 2012-04-13 | Disposition: A | Discharge status: Home / Self Care | Payer: Medicare Other | Source: Ambulatory Visit | Attending: Internal Medicine | Admitting: Internal Medicine

## 2012-04-13 ENCOUNTER — Other Ambulatory Visit: Payer: Self-pay | Admitting: Internal Medicine

## 2012-04-13 DIAGNOSIS — R928 Other abnormal and inconclusive findings on diagnostic imaging of breast: Secondary | ICD-10-CM

## 2012-04-13 DIAGNOSIS — R921 Mammographic calcification found on diagnostic imaging of breast: Secondary | ICD-10-CM

## 2012-04-15 ENCOUNTER — Telehealth: Payer: Self-pay | Admitting: *Deleted

## 2012-04-15 DIAGNOSIS — C50311 Malignant neoplasm of lower-inner quadrant of right female breast: Secondary | ICD-10-CM | POA: Insufficient documentation

## 2012-04-15 DIAGNOSIS — C50319 Malignant neoplasm of lower-inner quadrant of unspecified female breast: Secondary | ICD-10-CM

## 2012-04-15 NOTE — Telephone Encounter (Signed)
Confirmed [facility] for 04/21/12 at 0800 .  Instructions and contact information given.  

## 2012-04-16 ENCOUNTER — Ambulatory Visit
Admission: RE | Admit: 2012-04-16 | Discharge: 2012-04-16 | Disposition: A | Discharge status: Home / Self Care | Payer: Medicare Other | Source: Ambulatory Visit | Attending: Internal Medicine | Admitting: Internal Medicine

## 2012-04-16 DIAGNOSIS — R921 Mammographic calcification found on diagnostic imaging of breast: Secondary | ICD-10-CM

## 2012-04-19 ENCOUNTER — Other Ambulatory Visit: Payer: Self-pay | Admitting: Internal Medicine

## 2012-04-19 DIAGNOSIS — C50912 Malignant neoplasm of unspecified site of left female breast: Secondary | ICD-10-CM

## 2012-04-19 DIAGNOSIS — C50911 Malignant neoplasm of unspecified site of right female breast: Secondary | ICD-10-CM

## 2012-04-21 ENCOUNTER — Encounter: Payer: Self-pay | Admitting: *Deleted

## 2012-04-21 ENCOUNTER — Ambulatory Visit (HOSPITAL_BASED_OUTPATIENT_CLINIC_OR_DEPARTMENT_OTHER): Payer: Medicare Other | Admitting: Oncology

## 2012-04-21 ENCOUNTER — Ambulatory Visit: Payer: Medicare Other | Attending: Surgery | Admitting: Physical Therapy

## 2012-04-21 ENCOUNTER — Other Ambulatory Visit (HOSPITAL_BASED_OUTPATIENT_CLINIC_OR_DEPARTMENT_OTHER): Payer: Medicare Other | Admitting: Lab

## 2012-04-21 ENCOUNTER — Ambulatory Visit: Payer: Medicare Other

## 2012-04-21 ENCOUNTER — Encounter: Payer: Self-pay | Admitting: Oncology

## 2012-04-21 ENCOUNTER — Ambulatory Visit
Admission: RE | Admit: 2012-04-21 | Discharge: 2012-04-21 | Disposition: A | Discharge status: Home / Self Care | Payer: Medicare Other | Source: Ambulatory Visit | Attending: Radiation Oncology | Admitting: Radiation Oncology

## 2012-04-21 ENCOUNTER — Encounter (INDEPENDENT_AMBULATORY_CARE_PROVIDER_SITE_OTHER): Payer: Self-pay | Admitting: Surgery

## 2012-04-21 ENCOUNTER — Ambulatory Visit (HOSPITAL_BASED_OUTPATIENT_CLINIC_OR_DEPARTMENT_OTHER): Payer: Medicare Other | Admitting: Surgery

## 2012-04-21 VITALS — BP 165/75 | HR 77 | Temp 98.3°F | Resp 20 | Ht 65.5 in | Wt 161.0 lb

## 2012-04-21 VITALS — BP 165/75 | HR 77 | Temp 98.3°F | Resp 20 | Ht 65.5 in | Wt 161.6 lb

## 2012-04-21 DIAGNOSIS — IMO0001 Reserved for inherently not codable concepts without codable children: Secondary | ICD-10-CM | POA: Insufficient documentation

## 2012-04-21 DIAGNOSIS — C50319 Malignant neoplasm of lower-inner quadrant of unspecified female breast: Secondary | ICD-10-CM

## 2012-04-21 DIAGNOSIS — C50219 Malignant neoplasm of upper-inner quadrant of unspecified female breast: Secondary | ICD-10-CM

## 2012-04-21 DIAGNOSIS — R293 Abnormal posture: Secondary | ICD-10-CM | POA: Insufficient documentation

## 2012-04-21 DIAGNOSIS — C50919 Malignant neoplasm of unspecified site of unspecified female breast: Secondary | ICD-10-CM

## 2012-04-21 DIAGNOSIS — M25619 Stiffness of unspecified shoulder, not elsewhere classified: Secondary | ICD-10-CM | POA: Insufficient documentation

## 2012-04-21 LAB — COMPREHENSIVE METABOLIC PANEL (CC13)
AST: 21 U/L (ref 5–34)
Albumin: 3.4 g/dL — ABNORMAL LOW (ref 3.5–5.0)
Alkaline Phosphatase: 83 U/L (ref 40–150)
BUN: 25.2 mg/dL (ref 7.0–26.0)
Calcium: 9.7 mg/dL (ref 8.4–10.4)
Chloride: 105 mEq/L (ref 98–107)
Glucose: 152 mg/dl — ABNORMAL HIGH (ref 70–99)
Potassium: 3.8 mEq/L (ref 3.5–5.1)
Sodium: 141 mEq/L (ref 136–145)
Total Protein: 7.4 g/dL (ref 6.4–8.3)

## 2012-04-21 LAB — CBC WITH DIFFERENTIAL/PLATELET
Basophils Absolute: 0 10*3/uL (ref 0.0–0.1)
Eosinophils Absolute: 0.2 10*3/uL (ref 0.0–0.5)
RBC: 3.71 10*6/uL (ref 3.70–5.45)
RDW: 14.8 % — ABNORMAL HIGH (ref 11.2–14.5)
lymph#: 0.9 10*3/uL (ref 0.9–3.3)

## 2012-04-21 NOTE — Progress Notes (Signed)
Checked in new pt with no financial concerns. °

## 2012-04-21 NOTE — Progress Notes (Signed)
I met with Alison Gross and her husband today regarding her breast cancer. She has elected for bilateral mastectomies. She will have a sentinel lymph node biopsy on the right. I do not see a role for adjuvant radiation. I discussed with her that she would require radiation if she does have a positive sentinel lymph node.

## 2012-04-21 NOTE — Progress Notes (Signed)
Patient ID: Alison Gross, female   DOB: 08/07/1932, 79 y.o.   MRN: [id number]  Chief Complaint  Patient presents with  . Breast Cancer    bilateral    HPI Alison Gross is a 79 y.o. female.  She recently had routine mammography and a right breast cancer was found as well as a second cancer on the left side. 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 She is receptor positive, HER-2 negative. She is having no symptoms. She has no family history of breast cancer. She was otherwise asymptomatic. Many years ago she had a cervical biopsy that was done on the left side showing "radial scar"  HPI  Past Medical History  Diagnosis Date  . Diabetes mellitus without complication   . Hypertension   . Hypercholesterolemia     Past Surgical History  Procedure Laterality Date  . Abdominal hysterectomy    . Goiter    . Carpal tunnel release    . Breast biopsy    . Esophagogastroduodenoscopy  01/24/2012    Procedure: ESOPHAGOGASTRODUODENOSCOPY (EGD);  Surgeon: Marc E Magod, MD;  Location: [facility];  Service: Endoscopy;  Laterality: N/A;    Family History  Problem Relation Age of Onset  . Brain cancer Mother   . Aneurysm Father     Social History History  Substance Use Topics  . Smoking status: Former Smoker -- 0.50 packs/day    Types: Cigarettes  . Smokeless tobacco: Not on file  . Alcohol Use: No    No Known Allergies  Current Outpatient Prescriptions  Medication Sig Dispense Refill  . benzonatate (TESSALON) 100 MG capsule Use 1-2 tablets 3 times daily as necessary for cough. May be used with other cough medicines if needed.  30 capsule  0  . carvedilol (COREG) 3.125 MG tablet Take 3.125 mg by mouth 2 (two) times daily with a meal.      . Cholecalciferol (VITAMIN D) 2000 UNITS CAPS Take 1 capsule by mouth every morning.      . ciprofloxacin (CIPRO) 500 MG tablet Take 1 tablet (500 mg total) by mouth 2 (two) times daily.  7 tablet  0  .  guaiFENesin-codeine (ROBITUSSIN AC) 100-10 MG/5ML syrup Take 1 teaspoon every 6 hours as needed for cough  120 mL  0  . HYDROcodone-acetaminophen (NORCO/VICODIN) 5-325 MG per tablet Take 1-2 tablets by mouth every 4 (four) hours as needed.  30 tablet  0  . losartan-hydrochlorothiazide (HYZAAR) 50-12.5 MG per tablet Take 1 tablet by mouth every morning.      . Multiple Vitamin (MULTIVITAMIN WITH MINERALS) TABS Take 1 tablet by mouth daily.      . pantoprazole (PROTONIX) 40 MG tablet Take 1 tablet (40 mg total) by mouth 2 (two) times daily.  30 tablet  0  . rosuvastatin (CRESTOR) 20 MG tablet Take 20 mg by mouth every evening.       No current facility-administered medications for this visit.    Review of Systems Review of Systems  Constitutional: Positive for fever. Negative for chills and unexpected weight change.  HENT: Positive for tinnitus. Negative for hearing loss, congestion, sore throat, trouble swallowing and voice change.   Eyes: Negative for visual disturbance.  Respiratory: Positive for cough. Negative for wheezing.   Cardiovascular: Negative for chest pain, palpitations and leg swelling.  Gastrointestinal: Negative for nausea, vomiting, abdominal pain, diarrhea, constipation, blood in stool, abdominal distention and anal bleeding.  Genitourinary: Negative for hematuria, vaginal bleeding and difficulty urinating.    Musculoskeletal: Negative for arthralgias.  Skin: Negative for rash and wound.  Neurological: Negative for seizures, syncope and headaches.  Hematological: Negative for adenopathy. Does not bruise/bleed easily.  Psychiatric/Behavioral: Negative for confusion.    Blood pressure 165/75, pulse 77, temperature 98.3 F (36.8 C), resp. rate 20, height 5' 5.5" (1.664 m), weight 161 lb (73.029 kg).  Physical Exam Physical Exam  Vitals reviewed. Constitutional: She is oriented to person, place, and time. She appears well-developed and well-nourished. No distress.  HENT:    Head: Normocephalic and atraumatic.  Mouth/Throat: Oropharynx is clear and moist.  dentures  Eyes: Conjunctivae and EOM are normal. Pupils are equal, round, and reactive to light. No scleral icterus.  Neck: Normal range of motion. Neck supple. No tracheal deviation present. No thyromegaly present.  Cardiovascular: Normal rate, regular rhythm, normal heart sounds and intact distal pulses.  Exam reveals no gallop and no friction rub.   No murmur heard. Pulmonary/Chest: Effort normal and breath sounds normal. No respiratory distress. She has no wheezes. She has no rales. Right breast exhibits mass and nipple discharge. Right breast exhibits no inverted nipple, no skin change and no tenderness. Left breast exhibits mass. Left breast exhibits no inverted nipple, no nipple discharge, no skin change and no tenderness. Breasts are asymmetrical.    Well-healed thyroidectomy scar as diagrammed. Approximately 4 cm hard right breast mass lower inner quadrant which I think is some skin dimpling. It is not fixed to deep. It is not tender. The remainder of the right breast is unremarkable. The right breast is noticeably smaller than the left. The patient notes this has been always the case. In the left breast is hard mass in the upper outer quadrant with some adjacent irregular tissue present.  Abdominal: Soft. Bowel sounds are normal. She exhibits no distension and no mass. There is no tenderness. There is no rebound and no guarding.    Well-healed lower midline surgical scar  Musculoskeletal: Normal range of motion. She exhibits no edema and no tenderness.  Lymphadenopathy:    She has no cervical adenopathy.    She has no axillary adenopathy.       Right: No supraclavicular adenopathy present.       Left: No supraclavicular adenopathy present.  Neurological: She is alert and oriented to person, place, and time.  Skin: Skin is warm and dry. No rash noted. She is not diaphoretic. No erythema.   Psychiatric: She has a normal mood and affect. Her behavior is normal. Judgment and thought content normal.    Data Reviewed I have reviewedthe mammogram films and ultrasound films and reports and discuss them with the radiologist. I have reviewed the pathology slides and discuss them with the pathologist.. I have reviewed the situation with medical and radiation oncologists.  Assessment    Clinical stage II right breast cancer lower inner quadrant, receptor positive Clinical stage II left breast cancer upper outer quadrant, receptor positive     Plan    I have explained the pathophysiology and staging of breast cancer with particular attention to her exact situation. We discussed the multidisciplinary approach to breast cancer which often includes both medical and radiation oncology consultations.  We also discussed surgical options for the treatment of breast cancer including lumpectomy and mastectomy with possible reconstructive surgery. In addition we talked about the evaluation and management of lymph nodes including a description of sentinel lymph node biopsy and axillary dissections. We reviewed potential complications and risks including bleeding, infection, numbness,  lymphedema, and   the potential need for additional surgery.  She understands that for patients who are candidate for lumpectomy or mastectomy there is an equal survival rate with either technique, but a slightly higher local recurrence rate with lumpectomy. In addition she knows that a lumpectomy usually requires postoperative radiation as part of the management of the breast cancer.  We have discussed the likely postoperative course and plans for followup.  I have given the patient some written information that reviewed all of these issues. I believe her questions are answered and that she has a good understanding of the issues.   I believe she would be best served with bilateral mastectomies. The cancer on the right  side  is too large to have a good excision and appropriate cosmetic result. In addition to the known cancer on the left side there are some other probably benign areas but would need to be excised to make followup better. I discussed this at length with the patient and her family.review the situation with the medical radiation oncologist we feel that a right-sided central and to excision would be appropriate since this is a more aggressive side and mildly peter some postoperative radiation or chemotherapy after mastectomies. I discussed this with the patient again will go ahead and get this set up       Tejasvi Brissett J 04/21/2012, 9:26 AM    

## 2012-04-22 ENCOUNTER — Encounter: Payer: Self-pay | Admitting: Oncology
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
## 2012-04-23 ENCOUNTER — Other Ambulatory Visit: Payer: Medicare Other

## 2012-04-26 ENCOUNTER — Telehealth: Payer: Self-pay | Admitting: Oncology

## 2012-04-26 ENCOUNTER — Telehealth (INDEPENDENT_AMBULATORY_CARE_PROVIDER_SITE_OTHER): Payer: Self-pay | Admitting: General Surgery

## 2012-04-26 DIAGNOSIS — C50919 Malignant neoplasm of unspecified site of unspecified female breast: Secondary | ICD-10-CM

## 2012-04-26 NOTE — Telephone Encounter (Signed)
Pt and husband (on speaker phone) called, following talking with surgery schedulers, to ask for referral to plastic surgeon for reconstruction.  They may want to schedule both at the same surgery, so would need to delay the masty to coordinate.  Please call them back.

## 2012-04-26 NOTE — Telephone Encounter (Signed)
Referral to Dr Odis Luster made - ok per patient. Appt 05/05/2012 @ 11:00 am. Notes faxed and patient aware.

## 2012-04-26 NOTE — Addendum Note (Signed)
Addended byLiliana Cline on: 04/26/2012 03:33 PM   Modules accepted: Orders

## 2012-04-26 NOTE — Telephone Encounter (Signed)
S/w the pt's husband and he is aware of the lab and md appt on 05/31/2012@4 :00pm. S/w lisa and she is aware of the oncotype needed for this pt.

## 2012-04-27 ENCOUNTER — Telehealth: Payer: Self-pay | Admitting: *Deleted

## 2012-04-27 NOTE — Telephone Encounter (Signed)
Spoke to pt concerning [facility] from 04/21/12.  Pt denies questions or concerns regarding dx or treatment care plan.  Pt relate she would like to have reconstruction at time of bilateral mastectomy.  She is scheduled to see Dr. Odis Luster on 05/05/12.  I called Dr. Odis Luster office to see if she can be scheduled earlier.  They will call her if an earlier appt becomes available.  Encourage pt to call with further needs.  Received verbal understanding.

## 2012-04-29 ENCOUNTER — Encounter (HOSPITAL_COMMUNITY): Payer: Self-pay | Admitting: Pharmacist

## 2012-05-06 ENCOUNTER — Telehealth (INDEPENDENT_AMBULATORY_CARE_PROVIDER_SITE_OTHER): Payer: Self-pay

## 2012-05-06 NOTE — Telephone Encounter (Signed)
Patient called in stating she has not been set up for pre admission testing. I told her i would call and see about getting her scheduled and either I or pre admit will call her back. I called [facility] pre admit and Delice Bison took patients number and will call patient with appointment.

## 2012-05-07 ENCOUNTER — Encounter (HOSPITAL_COMMUNITY): Payer: Self-pay

## 2012-05-07 ENCOUNTER — Encounter (HOSPITAL_COMMUNITY)
Admission: RE | Admit: 2012-05-07 | Discharge: 2012-05-07 | Disposition: A | Discharge status: Home / Self Care | Payer: Medicare Other | Source: Ambulatory Visit | Attending: Surgery | Admitting: Surgery

## 2012-05-07 ENCOUNTER — Telehealth (INDEPENDENT_AMBULATORY_CARE_PROVIDER_SITE_OTHER): Payer: Self-pay

## 2012-05-07 ENCOUNTER — Encounter (HOSPITAL_COMMUNITY)
Admission: RE | Admit: 2012-05-07 | Discharge: 2012-05-07 | Disposition: A | Discharge status: Home / Self Care | Payer: Medicare Other | Source: Ambulatory Visit | Attending: Anesthesiology | Admitting: Anesthesiology

## 2012-05-07 HISTORY — DX: Pneumonia, unspecified organism: J18.9

## 2012-05-07 HISTORY — DX: Personal history of colon polyps, unspecified: Z86.0100

## 2012-05-07 HISTORY — DX: Gastric ulcer, unspecified as acute or chronic, without hemorrhage or perforation: K25.9

## 2012-05-07 HISTORY — DX: Gastro-esophageal reflux disease without esophagitis: K21.9

## 2012-05-07 HISTORY — DX: Cardiac arrhythmia, unspecified: I49.9

## 2012-05-07 HISTORY — DX: Personal history of colonic polyps: Z86.010

## 2012-05-07 HISTORY — DX: Personal history of other medical treatment: Z92.89

## 2012-05-07 LAB — CBC
HCT: 32.9 % — ABNORMAL LOW (ref 36.0–46.0)
RDW: 14.8 % (ref 11.5–15.5)
WBC: 6 10*3/uL (ref 4.0–10.5)

## 2012-05-07 LAB — BASIC METABOLIC PANEL
Chloride: 104 mEq/L (ref 96–112)
Creatinine, Ser: 1.26 mg/dL — ABNORMAL HIGH (ref 0.50–1.10)
GFR calc Af Amer: 46 mL/min — ABNORMAL LOW (ref >90)
GFR calc non Af Amer: 40 mL/min — ABNORMAL LOW (ref >90)
Potassium: 3.5 mEq/L (ref 3.5–5.1)

## 2012-05-07 LAB — SURGICAL PCR SCREEN
MRSA, PCR: NEGATIVE
Staphylococcus aureus: NEGATIVE

## 2012-05-07 MED ORDER — CHLORHEXIDINE GLUCONATE 4 % EX LIQD: 1.0000 "application " | Freq: Once | CUTANEOUS | Status: DC

## 2012-05-07 NOTE — Pre-Procedure Instructions (Signed)
Alison Gross  05/07/2012   Your procedure is scheduled on:  Mon, Mar 3 @ 7:30 AM  Report to [facility] at 5:30 AM.  Call this number if you have problems the morning of surgery: [phone]   Remember:   Do not eat food or drink liquids after midnight.   Take these medicines the morning of surgery with A SIP OF WATER: Carvedilol(Coreg) and Pantoprazole(Protonix)   Do not wear jewelry, make-up or nail polish.  Do not wear lotions, powders, or perfumes. You may wear deodorant.  Do not shave 48 hours prior to surgery.  Do not bring valuables to the hospital.  Contacts, dentures or bridgework may not be worn into surgery.  Leave suitcase in the car. After surgery it may be brought to your room.  For patients admitted to the hospital, checkout time is 11:00 AM the day of  discharge.   Patients discharged the day of surgery will not be allowed to drive  home.    Special Instructions: Shower using CHG 2 nights before surgery and the night before surgery.  If you shower the day of surgery use CHG.  Use special wash - you have one bottle of CHG for all showers.  You should use approximately 1/3 of the bottle for each shower.   Please read over the following fact sheets that you were given: Pain Booklet, Coughing and Deep Breathing, MRSA Information and Surgical Site Infection Prevention

## 2012-05-07 NOTE — Anesthesia Preprocedure Evaluation (Addendum)
Anesthesia Evaluation  Patient identified by MRN, date of birth, ID band Patient awake  General Assessment Comment:Enlarged right thyroid gland 12.7 X 7.5 cm by CXR (known goiter)  Reviewed: Allergy & Precautions, H&P , NPO status , Patient's Chart, lab work & pertinent test results  Airway Mallampati: I TM Distance: >3 FB Neck ROM: Full    Dental  (+) Edentulous Upper and Edentulous Lower   Pulmonary  breath sounds clear to auscultation        Cardiovascular hypertension, + dysrhythmias Rhythm:Regular Rate:Normal     Neuro/Psych    GI/Hepatic PUD, GERD-  ,  Endo/Other  diabetes  Renal/GU Renal InsufficiencyRenal disease     Musculoskeletal   Abdominal   Peds  Hematology   Anesthesia Other Findings   Reproductive/Obstetrics                        Anesthesia Physical Anesthesia Plan  ASA: III  Anesthesia Plan: General   Post-op Pain Management:    Induction: Intravenous  Airway Management Planned: Oral ETT  Additional Equipment:   Intra-op Plan:   Post-operative Plan: Extubation in OR  Informed Consent: I have reviewed the patients History and Physical, chart, labs and discussed the procedure including the risks, benefits and alternatives for the proposed anesthesia with the patient or authorized representative who has indicated his/her understanding and acceptance.     Plan Discussed with: CRNA and Surgeon  Anesthesia Plan Comments:         Anesthesia Quick Evaluation

## 2012-05-07 NOTE — Progress Notes (Signed)
Cardiologist is Dr.Ganji with last visit about 6months ago  Stress test done with Dr.Ganji about yr ago-to request  Echo to be requested from DR.Ganji  Denies ekg or cxr being done within a yr

## 2012-05-07 NOTE — Telephone Encounter (Signed)
The pa called to inform the preop cxr is abnormal showing enlarged thyroid.  The surgery is Monday.  I paged Dr Jamey Ripa to review it.

## 2012-05-07 NOTE — Progress Notes (Signed)
Anesthesia chart review: Patient is a 79 year old female scheduled for bilateral total mastectomies, right axillary sentinel lymph node excision by Dr. Jamey Ripa on 05/10/12.  History includes breast cancer, nonsmoker, hypertension, hypercholesterolemia, GERD, gastric ulcer, anemia, diabetes mellitus type 2, "dysrhythmia" without mention of afib, hyperthyroidism/multinodular goiter s/p radioactive iodine '12.  PCP is Dr. Dorothyann Peng.    She was evaluated by Cardiologist Dr. Jacinto Halim in July/August 2013 for SOB and abnormal Corus gene expression test.  She subsequently underwent a nuclear stress test on 09/19/11 that showed normal myocardial perfusion without ischemia or scar, normal LV EF 57%.    Echo on 09/20/11 [facility]) showed normal LV size, mild to moderate concentric hypertrophy, mild septal not seen. Diastolic filling with impaired relaxation pattern and normal to low pressure. Normal global wall motion. Normal systolic global function. Calculated EF 69%. Doppler evidence of grade 1 (impaired) diastolic dysfunction. Left atrial cavity is moderately dilated. Aortic valve structurally normal. Trace aortic regurgitation.  EKG on 05/07/12 showed NSR.  Preoperative labs noted.  CXR on 05/07/12 showed: No acute infiltrate or pulmonary edema. There is progression in size of the right paratracheal soft tissue density due to enlarged right thyroid gland measures 12.7 x 7.5 cm. On prior exam measures 10.3 x 5.8 cm. Clinical correlation is necessary.  (Comparison CXR was from 04/25/08.)  Report called to [facility] Triage RN Sarah.  She will review with Dr. Jamey Ripa.  PCP Dr. Allyne Gee is out of the office today, but I did leave a voicemail with her nurse Celine Mans regarding CXR results.  Report also faxed with confirmation.  Defer further evaluation orders to Dr. Jamey Ripa and/or Dr. Allyne Gee.    Anesthesiologist Dr. Gypsy Balsam also notified of patient's large goiter since it will likely make establishing an airway more challenging.   She will be evaluated by her assigned anesthesiologist on the day of surgery to discuss the definitive anesthesia plan.  Shonna Chock, PA-C 05/07/12 1550

## 2012-05-09 MED ORDER — CEFAZOLIN SODIUM-DEXTROSE 2-3 GM-% IV SOLR
2.0000 g | INTRAVENOUS | Status: AC
  Administered 2012-05-10: 2 g via INTRAVENOUS

## 2012-05-10 ENCOUNTER — Observation Stay (HOSPITAL_COMMUNITY)
Admission: RE | Admit: 2012-05-10 | Discharge: 2012-05-12 | Disposition: A | Discharge status: Home / Self Care | Payer: Medicare Other | Source: Ambulatory Visit | Attending: Surgery | Admitting: Surgery

## 2012-05-10 ENCOUNTER — Ambulatory Visit (HOSPITAL_COMMUNITY): Payer: Medicare Other | Admitting: Vascular Surgery

## 2012-05-10 ENCOUNTER — Encounter (HOSPITAL_COMMUNITY)
Admission: RE | Disposition: A | Discharge status: Home / Self Care | Payer: Self-pay | Source: Ambulatory Visit | Attending: Surgery

## 2012-05-10 ENCOUNTER — Encounter (HOSPITAL_COMMUNITY): Payer: Self-pay | Admitting: Vascular Surgery

## 2012-05-10 ENCOUNTER — Encounter (HOSPITAL_COMMUNITY)
Admission: RE | Admit: 2012-05-10 | Discharge: 2012-05-10 | Disposition: A | Discharge status: Home / Self Care | Payer: Medicare Other | Source: Ambulatory Visit | Attending: Surgery | Admitting: Surgery

## 2012-05-10 ENCOUNTER — Encounter (HOSPITAL_COMMUNITY): Payer: Self-pay | Admitting: *Deleted

## 2012-05-10 DIAGNOSIS — E119 Type 2 diabetes mellitus without complications: Secondary | ICD-10-CM | POA: Insufficient documentation

## 2012-05-10 DIAGNOSIS — C50319 Malignant neoplasm of lower-inner quadrant of unspecified female breast: Principal | ICD-10-CM | POA: Insufficient documentation

## 2012-05-10 DIAGNOSIS — D0590 Unspecified type of carcinoma in situ of unspecified breast: Secondary | ICD-10-CM | POA: Insufficient documentation

## 2012-05-10 DIAGNOSIS — Z01818 Encounter for other preprocedural examination: Secondary | ICD-10-CM | POA: Insufficient documentation

## 2012-05-10 DIAGNOSIS — C50311 Malignant neoplasm of lower-inner quadrant of right female breast: Secondary | ICD-10-CM

## 2012-05-10 DIAGNOSIS — M1710 Unilateral primary osteoarthritis, unspecified knee: Secondary | ICD-10-CM | POA: Insufficient documentation

## 2012-05-10 DIAGNOSIS — C50419 Malignant neoplasm of upper-outer quadrant of unspecified female breast: Secondary | ICD-10-CM | POA: Insufficient documentation

## 2012-05-10 DIAGNOSIS — D62 Acute posthemorrhagic anemia: Secondary | ICD-10-CM | POA: Insufficient documentation

## 2012-05-10 DIAGNOSIS — Z01812 Encounter for preprocedural laboratory examination: Secondary | ICD-10-CM | POA: Insufficient documentation

## 2012-05-10 DIAGNOSIS — C773 Secondary and unspecified malignant neoplasm of axilla and upper limb lymph nodes: Secondary | ICD-10-CM | POA: Insufficient documentation

## 2012-05-10 DIAGNOSIS — I10 Essential (primary) hypertension: Secondary | ICD-10-CM | POA: Insufficient documentation

## 2012-05-10 DIAGNOSIS — N289 Disorder of kidney and ureter, unspecified: Secondary | ICD-10-CM | POA: Insufficient documentation

## 2012-05-10 DIAGNOSIS — Z01810 Encounter for preprocedural cardiovascular examination: Secondary | ICD-10-CM | POA: Insufficient documentation

## 2012-05-10 DIAGNOSIS — C50919 Malignant neoplasm of unspecified site of unspecified female breast: Secondary | ICD-10-CM

## 2012-05-10 HISTORY — PX: AXILLARY SENTINEL NODE BIOPSY: SHX5738

## 2012-05-10 HISTORY — PX: TOTAL MASTECTOMY: SHX6129

## 2012-05-10 SURGERY — MASTECTOMY, SIMPLE
Anesthesia: General | Site: Breast | Laterality: Right | Wound class: Clean

## 2012-05-10 MED ORDER — LOSARTAN POTASSIUM-HCTZ 100-12.5 MG PO TABS: 1.0000 | ORAL_TABLET | Freq: Every day | ORAL | Status: DC

## 2012-05-10 MED ORDER — SODIUM CHLORIDE 0.9 % IJ SOLN
INTRAMUSCULAR | Status: DC | PRN
  Administered 2012-05-10: 08:00:00 via INTRAMUSCULAR

## 2012-05-10 MED ORDER — OXYCODONE-ACETAMINOPHEN 5-325 MG PO TABS: 1.0000 | ORAL_TABLET | ORAL | Status: DC | PRN

## 2012-05-10 MED ORDER — ONDANSETRON HCL 4 MG PO TABS: 4.0000 mg | ORAL_TABLET | Freq: Four times a day (QID) | ORAL | Status: DC | PRN

## 2012-05-10 MED ORDER — LOSARTAN POTASSIUM 50 MG PO TABS
100.0000 mg | ORAL_TABLET | Freq: Every day | ORAL | Status: DC
  Administered 2012-05-10 – 2012-05-11 (×2): 100 mg via ORAL
  Filled 2012-05-10 (×3): qty 2

## 2012-05-10 MED ORDER — LACTATED RINGERS IV SOLN
INTRAVENOUS | Status: DC | PRN
  Administered 2012-05-10 (×2): via INTRAVENOUS

## 2012-05-10 MED ORDER — FENTANYL CITRATE 0.05 MG/ML IJ SOLN: 50.0000 ug | Freq: Once | INTRAMUSCULAR | Status: DC

## 2012-05-10 MED ORDER — ONDANSETRON HCL 4 MG/2ML IJ SOLN
4.0000 mg | Freq: Four times a day (QID) | INTRAMUSCULAR | Status: DC | PRN
  Administered 2012-05-10: 4 mg via INTRAVENOUS
  Filled 2012-05-10: qty 2

## 2012-05-10 MED ORDER — LIDOCAINE HCL (CARDIAC) 20 MG/ML IV SOLN
INTRAVENOUS | Status: DC | PRN
  Administered 2012-05-10: 80 mg via INTRAVENOUS

## 2012-05-10 MED ORDER — HYDROMORPHONE HCL PF 1 MG/ML IJ SOLN
0.2500 mg | INTRAMUSCULAR | Status: DC | PRN
  Administered 2012-05-10 (×2): 0.5 mg via INTRAVENOUS

## 2012-05-10 MED ORDER — METHYLENE BLUE 1 % INJ SOLN
INTRAMUSCULAR | Status: AC
  Filled 2012-05-10: qty 10

## 2012-05-10 MED ORDER — OXYCODONE HCL 5 MG/5ML PO SOLN: 5.0000 mg | Freq: Once | ORAL | Status: DC | PRN

## 2012-05-10 MED ORDER — MIDAZOLAM HCL 5 MG/5ML IJ SOLN
INTRAMUSCULAR | Status: DC | PRN
  Administered 2012-05-10: 1 mg via INTRAVENOUS
  Administered 2012-05-10: .5 mg via INTRAVENOUS

## 2012-05-10 MED ORDER — ROCURONIUM BROMIDE 100 MG/10ML IV SOLN
INTRAVENOUS | Status: DC | PRN
  Administered 2012-05-10: 40 mg via INTRAVENOUS

## 2012-05-10 MED ORDER — TECHNETIUM TC 99M SULFUR COLLOID FILTERED
1.0000 | Freq: Once | INTRAVENOUS | Status: AC | PRN
  Administered 2012-05-10: 1 via INTRADERMAL

## 2012-05-10 MED ORDER — HYDROMORPHONE HCL PF 1 MG/ML IJ SOLN
INTRAMUSCULAR | Status: AC
  Filled 2012-05-10: qty 1

## 2012-05-10 MED ORDER — FENTANYL CITRATE 0.05 MG/ML IJ SOLN
INTRAMUSCULAR | Status: DC | PRN
  Administered 2012-05-10 (×4): 50 ug via INTRAVENOUS

## 2012-05-10 MED ORDER — ONDANSETRON HCL 4 MG/2ML IJ SOLN
INTRAMUSCULAR | Status: DC | PRN
  Administered 2012-05-10 (×2): 4 mg via INTRAVENOUS

## 2012-05-10 MED ORDER — ARTIFICIAL TEARS OP OINT
TOPICAL_OINTMENT | OPHTHALMIC | Status: DC | PRN
  Administered 2012-05-10: 1 via OPHTHALMIC

## 2012-05-10 MED ORDER — PHENYLEPHRINE HCL 10 MG/ML IJ SOLN
INTRAMUSCULAR | Status: DC | PRN
  Administered 2012-05-10 (×3): 40 ug via INTRAVENOUS

## 2012-05-10 MED ORDER — PROMETHAZINE HCL 25 MG/ML IJ SOLN: 6.2500 mg | INTRAMUSCULAR | Status: DC | PRN

## 2012-05-10 MED ORDER — OXYCODONE HCL 5 MG PO TABS: 5.0000 mg | ORAL_TABLET | Freq: Once | ORAL | Status: DC | PRN

## 2012-05-10 MED ORDER — SODIUM CHLORIDE 0.9 % IR SOLN
Status: DC | PRN
  Administered 2012-05-10: 09:00:00

## 2012-05-10 MED ORDER — 0.9 % SODIUM CHLORIDE (POUR BTL) OPTIME
TOPICAL | Status: DC | PRN
  Administered 2012-05-10: 1000 mL

## 2012-05-10 MED ORDER — HEPARIN SODIUM (PORCINE) 5000 UNIT/ML IJ SOLN
5000.0000 [IU] | Freq: Three times a day (TID) | INTRAMUSCULAR | Status: DC
  Administered 2012-05-11 – 2012-05-12 (×3): 5000 [IU] via SUBCUTANEOUS
  Filled 2012-05-10 (×6): qty 1

## 2012-05-10 MED ORDER — HYDROCHLOROTHIAZIDE 12.5 MG PO CAPS
12.5000 mg | ORAL_CAPSULE | Freq: Every day | ORAL | Status: DC
  Administered 2012-05-10 – 2012-05-11 (×2): 12.5 mg via ORAL
  Filled 2012-05-10 (×3): qty 1

## 2012-05-10 MED ORDER — GLYCOPYRROLATE 0.2 MG/ML IJ SOLN
INTRAMUSCULAR | Status: DC | PRN
  Administered 2012-05-10: .8 mg via INTRAVENOUS

## 2012-05-10 MED ORDER — NEOSTIGMINE METHYLSULFATE 1 MG/ML IJ SOLN
INTRAMUSCULAR | Status: DC | PRN
  Administered 2012-05-10: 5 mg via INTRAVENOUS

## 2012-05-10 MED ORDER — MIDAZOLAM HCL 2 MG/2ML IJ SOLN: 1.0000 mg | INTRAMUSCULAR | Status: DC | PRN

## 2012-05-10 MED ORDER — CARVEDILOL 6.25 MG PO TABS
6.2500 mg | ORAL_TABLET | Freq: Two times a day (BID) | ORAL | Status: DC
  Administered 2012-05-10 – 2012-05-11 (×3): 6.25 mg via ORAL
  Filled 2012-05-10 (×7): qty 1

## 2012-05-10 MED ORDER — PROPOFOL 10 MG/ML IV BOLUS
INTRAVENOUS | Status: DC | PRN
  Administered 2012-05-10: 130 mg via INTRAVENOUS

## 2012-05-10 MED ORDER — HYDROMORPHONE HCL PF 1 MG/ML IJ SOLN: 2.0000 mg | INTRAMUSCULAR | Status: DC | PRN

## 2012-05-10 MED ORDER — POTASSIUM CHLORIDE 2 MEQ/ML IV SOLN
INTRAVENOUS | Status: DC
  Administered 2012-05-10 – 2012-05-12 (×4): via INTRAVENOUS
  Filled 2012-05-10 (×6): qty 1000

## 2012-05-10 SURGICAL SUPPLY — 78 items
ADH SKN CLS APL DERMABOND .7 (GAUZE/BANDAGES/DRESSINGS) ×8
APPLIER CLIP 9.375 MED OPEN (MISCELLANEOUS) ×6
APPLIER CLIP 9.375 SM OPEN (CLIP)
APR CLP MED 9.3 20 MLT OPN (MISCELLANEOUS) ×4
APR CLP SM 9.3 20 MLT OPN (CLIP)
BAG DECANTER FOR FLEXI CONT (MISCELLANEOUS) ×1 IMPLANT
BINDER BREAST LRG (GAUZE/BANDAGES/DRESSINGS) ×1 IMPLANT
BINDER BREAST XLRG (GAUZE/BANDAGES/DRESSINGS) IMPLANT
BIOPATCH RED 1 DISK 7.0 (GAUZE/BANDAGES/DRESSINGS) ×5 IMPLANT
BLADE SURG 10 STRL SS (BLADE) ×3 IMPLANT
BLADE SURG 15 STRL LF DISP TIS (BLADE) ×2 IMPLANT
BLADE SURG 15 STRL SS (BLADE) ×3
CANISTER SUCTION 2500CC (MISCELLANEOUS) ×3 IMPLANT
CHLORAPREP W/TINT 26ML (MISCELLANEOUS) ×4 IMPLANT
CLIP APPLIE 9.375 MED OPEN (MISCELLANEOUS) ×2 IMPLANT
CLIP APPLIE 9.375 SM OPEN (CLIP) IMPLANT
CLOTH BEACON ORANGE TIMEOUT ST (SAFETY) ×3 IMPLANT
CONT SPEC STER OR (MISCELLANEOUS) ×4 IMPLANT
COVER PROBE W GEL 5X96 (DRAPES) ×3 IMPLANT
COVER SURGICAL LIGHT HANDLE (MISCELLANEOUS) ×3 IMPLANT
DECANTER SPIKE VIAL GLASS SM (MISCELLANEOUS) ×2 IMPLANT
DERMABOND ADVANCED (GAUZE/BANDAGES/DRESSINGS) ×4
DERMABOND ADVANCED .7 DNX12 (GAUZE/BANDAGES/DRESSINGS) ×2 IMPLANT
DRAIN CHANNEL 19F RND (DRAIN) ×5 IMPLANT
DRAPE CHEST BREAST 15X10 FENES (DRAPES) ×3 IMPLANT
DRAPE PROXIMA HALF (DRAPES) ×2 IMPLANT
DRAPE SURG 17X23 STRL (DRAPES) ×12 IMPLANT
DRAPE UTILITY 15X26 W/TAPE STR (DRAPE) ×6 IMPLANT
ELECT BLADE 4.0 EZ CLEAN MEGAD (MISCELLANEOUS) ×3
ELECT CAUTERY BLADE 6.4 (BLADE) ×3 IMPLANT
ELECT REM PT RETURN 9FT ADLT (ELECTROSURGICAL) ×3
ELECTRODE BLDE 4.0 EZ CLN MEGD (MISCELLANEOUS) ×2 IMPLANT
ELECTRODE REM PT RTRN 9FT ADLT (ELECTROSURGICAL) ×2 IMPLANT
EVACUATOR SILICONE 100CC (DRAIN) ×5 IMPLANT
GAUZE VASELINE 3X9 (GAUZE/BANDAGES/DRESSINGS) ×2 IMPLANT
GLOVE BIO SURGEON STRL SZ 6.5 (GLOVE) ×1 IMPLANT
GLOVE BIOGEL PI IND STRL 6 (GLOVE) IMPLANT
GLOVE BIOGEL PI IND STRL 6.5 (GLOVE) IMPLANT
GLOVE BIOGEL PI IND STRL 8 (GLOVE) IMPLANT
GLOVE BIOGEL PI INDICATOR 6 (GLOVE) ×1
GLOVE BIOGEL PI INDICATOR 6.5 (GLOVE) ×1
GLOVE BIOGEL PI INDICATOR 8 (GLOVE) ×3
GLOVE EUDERMIC 7 POWDERFREE (GLOVE) ×3 IMPLANT
GLOVE SURG SS PI 6.5 STRL IVOR (GLOVE) ×2 IMPLANT
GLOVE SURG SS PI 7.0 STRL IVOR (GLOVE) ×1 IMPLANT
GOWN PREVENTION PLUS XLARGE (GOWN DISPOSABLE) ×4 IMPLANT
GOWN STRL NON-REIN LRG LVL3 (GOWN DISPOSABLE) ×7 IMPLANT
KIT BASIN OR (CUSTOM PROCEDURE TRAY) ×4 IMPLANT
KIT ROOM TURNOVER OR (KITS) ×3 IMPLANT
NDL 18GX1X1/2 (RX/OR ONLY) (NEEDLE) ×2 IMPLANT
NDL HYPO 25GX1X1/2 BEV (NEEDLE) ×4 IMPLANT
NDL SPNL 22GX3.5 QUINCKE BK (NEEDLE) ×2 IMPLANT
NEEDLE 18GX1X1/2 (RX/OR ONLY) (NEEDLE) ×3 IMPLANT
NEEDLE HYPO 25GX1X1/2 BEV (NEEDLE) ×3 IMPLANT
NEEDLE SPNL 22GX3.5 QUINCKE BK (NEEDLE) IMPLANT
NS IRRIG 1000ML POUR BTL (IV SOLUTION) ×3 IMPLANT
PACK GENERAL/GYN (CUSTOM PROCEDURE TRAY) ×4 IMPLANT
PACK SURGICAL SETUP 50X90 (CUSTOM PROCEDURE TRAY) ×2 IMPLANT
PAD ARMBOARD 7.5X6 YLW CONV (MISCELLANEOUS) ×3 IMPLANT
PENCIL BUTTON HOLSTER BLD 10FT (ELECTRODE) ×3 IMPLANT
SPECIMEN JAR LARGE (MISCELLANEOUS) ×2 IMPLANT
SPECIMEN JAR MEDIUM (MISCELLANEOUS) ×1 IMPLANT
SPECIMEN JAR X LARGE (MISCELLANEOUS) ×3 IMPLANT
SPONGE GAUZE 4X4 12PLY (GAUZE/BANDAGES/DRESSINGS) ×2 IMPLANT
SPONGE INTESTINAL PEANUT (DISPOSABLE) ×1 IMPLANT
SPONGE LAP 4X18 X RAY DECT (DISPOSABLE) ×3 IMPLANT
STAPLER VISISTAT 35W (STAPLE) ×3 IMPLANT
SUT ETHILON 2 0 FS 18 (SUTURE) ×5 IMPLANT
SUT MNCRL AB 3-0 PS2 18 (SUTURE) ×8 IMPLANT
SUT MNCRL AB 4-0 PS2 18 (SUTURE) ×4 IMPLANT
SUT VIC AB 3-0 SH 18 (SUTURE) ×3 IMPLANT
SYR 50ML LL SCALE MARK (SYRINGE) ×2 IMPLANT
SYR CONTROL 10ML LL (SYRINGE) ×5 IMPLANT
TOWEL OR 17X24 6PK STRL BLUE (TOWEL DISPOSABLE) ×3 IMPLANT
TOWEL OR 17X26 10 PK STRL BLUE (TOWEL DISPOSABLE) ×3 IMPLANT
TOWEL OR NON WOVEN STRL DISP B (DISPOSABLE) ×1 IMPLANT
TUBE CONNECTING 12X1/4 (SUCTIONS) ×2 IMPLANT
YANKAUER SUCT BULB TIP NO VENT (SUCTIONS) ×2 IMPLANT

## 2012-05-10 NOTE — H&P (View-Only) (Signed)
Patient ID: Alison Gross, female   DOB: 1932-04-17, 79 y.o.   MRN: [id number]  Chief Complaint  Patient presents with  . Breast Cancer    bilateral    HPI Alison Gross is a 79 y.o. female.  She recently had routine mammography and a right breast cancer was found as well as a second cancer on the left side. 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 She is receptor positive, HER-2 negative. She is having no symptoms. She has no family history of breast cancer. She was otherwise asymptomatic. Many years ago she had a cervical biopsy that was done on the left side showing "radial scar"  HPI  Past Medical History  Diagnosis Date  . Diabetes mellitus without complication   . Hypertension   . Hypercholesterolemia     Past Surgical History  Procedure Laterality Date  . Abdominal hysterectomy    . Goiter    . Carpal tunnel release    . Breast biopsy    . Esophagogastroduodenoscopy  01/24/2012    Procedure: ESOPHAGOGASTRODUODENOSCOPY (EGD);  Surgeon: Petra Kuba, MD;  Location: [facility];  Service: Endoscopy;  Laterality: N/A;    Family History  Problem Relation Age of Onset  . Brain cancer Mother   . Aneurysm Father     Social History History  Substance Use Topics  . Smoking status: Former Smoker -- 0.50 packs/day    Types: Cigarettes  . Smokeless tobacco: Not on file  . Alcohol Use: No    No Known Allergies  Current Outpatient Prescriptions  Medication Sig Dispense Refill  . benzonatate (TESSALON) 100 MG capsule Use 1-2 tablets 3 times daily as necessary for cough. May be used with other cough medicines if needed.  30 capsule  0  . carvedilol (COREG) 3.125 MG tablet Take 3.125 mg by mouth 2 (two) times daily with a meal.      . Cholecalciferol (VITAMIN D) 2000 UNITS CAPS Take 1 capsule by mouth every morning.      . ciprofloxacin (CIPRO) 500 MG tablet Take 1 tablet (500 mg total) by mouth 2 (two) times daily.  7 tablet  0  .  guaiFENesin-codeine (ROBITUSSIN AC) 100-10 MG/5ML syrup Take 1 teaspoon every 6 hours as needed for cough  120 mL  0  . HYDROcodone-acetaminophen (NORCO/VICODIN) 5-325 MG per tablet Take 1-2 tablets by mouth every 4 (four) hours as needed.  30 tablet  0  . losartan-hydrochlorothiazide (HYZAAR) 50-12.5 MG per tablet Take 1 tablet by mouth every morning.      . Multiple Vitamin (MULTIVITAMIN WITH MINERALS) TABS Take 1 tablet by mouth daily.      . pantoprazole (PROTONIX) 40 MG tablet Take 1 tablet (40 mg total) by mouth 2 (two) times daily.  30 tablet  0  . rosuvastatin (CRESTOR) 20 MG tablet Take 20 mg by mouth every evening.       No current facility-administered medications for this visit.    Review of Systems Review of Systems  Constitutional: Positive for fever. Negative for chills and unexpected weight change.  HENT: Positive for tinnitus. Negative for hearing loss, congestion, sore throat, trouble swallowing and voice change.   Eyes: Negative for visual disturbance.  Respiratory: Positive for cough. Negative for wheezing.   Cardiovascular: Negative for chest pain, palpitations and leg swelling.  Gastrointestinal: Negative for nausea, vomiting, abdominal pain, diarrhea, constipation, blood in stool, abdominal distention and anal bleeding.  Genitourinary: Negative for hematuria, vaginal bleeding and difficulty urinating.  Musculoskeletal: Negative for arthralgias.  Skin: Negative for rash and wound.  Neurological: Negative for seizures, syncope and headaches.  Hematological: Negative for adenopathy. Does not bruise/bleed easily.  Psychiatric/Behavioral: Negative for confusion.    Blood pressure 165/75, pulse 77, temperature 98.3 F (36.8 C), resp. rate 20, height 5' 5.5" (1.664 m), weight 161 lb (73.029 kg).  Physical Exam Physical Exam  Vitals reviewed. Constitutional: She is oriented to person, place, and time. She appears well-developed and well-nourished. No distress.  HENT:    Head: Normocephalic and atraumatic.  Mouth/Throat: Oropharynx is clear and moist.  dentures  Eyes: Conjunctivae and EOM are normal. Pupils are equal, round, and reactive to light. No scleral icterus.  Neck: Normal range of motion. Neck supple. No tracheal deviation present. No thyromegaly present.  Cardiovascular: Normal rate, regular rhythm, normal heart sounds and intact distal pulses.  Exam reveals no gallop and no friction rub.   No murmur heard. Pulmonary/Chest: Effort normal and breath sounds normal. No respiratory distress. She has no wheezes. She has no rales. Right breast exhibits mass and nipple discharge. Right breast exhibits no inverted nipple, no skin change and no tenderness. Left breast exhibits mass. Left breast exhibits no inverted nipple, no nipple discharge, no skin change and no tenderness. Breasts are asymmetrical.    Well-healed thyroidectomy scar as diagrammed. Approximately 4 cm hard right breast mass lower inner quadrant which I think is some skin dimpling. It is not fixed to deep. It is not tender. The remainder of the right breast is unremarkable. The right breast is noticeably smaller than the left. The patient notes this has been always the case. In the left breast is hard mass in the upper outer quadrant with some adjacent irregular tissue present.  Abdominal: Soft. Bowel sounds are normal. She exhibits no distension and no mass. There is no tenderness. There is no rebound and no guarding.    Well-healed lower midline surgical scar  Musculoskeletal: Normal range of motion. She exhibits no edema and no tenderness.  Lymphadenopathy:    She has no cervical adenopathy.    She has no axillary adenopathy.       Right: No supraclavicular adenopathy present.       Left: No supraclavicular adenopathy present.  Neurological: She is alert and oriented to person, place, and time.  Skin: Skin is warm and dry. No rash noted. She is not diaphoretic. No erythema.   Psychiatric: She has a normal mood and affect. Her behavior is normal. Judgment and thought content normal.    Data Reviewed I have reviewedthe mammogram films and ultrasound films and reports and discuss them with the radiologist. I have reviewed the pathology slides and discuss them with the pathologist.. I have reviewed the situation with medical and radiation oncologists.  Assessment    Clinical stage II right breast cancer lower inner quadrant, receptor positive Clinical stage II left breast cancer upper outer quadrant, receptor positive     Plan    I have explained the pathophysiology and staging of breast cancer with particular attention to her exact situation. We discussed the multidisciplinary approach to breast cancer which often includes both medical and radiation oncology consultations.  We also discussed surgical options for the treatment of breast cancer including lumpectomy and mastectomy with possible reconstructive surgery. In addition we talked about the evaluation and management of lymph nodes including a description of sentinel lymph node biopsy and axillary dissections. We reviewed potential complications and risks including bleeding, infection, numbness,  lymphedema, and  the potential need for additional surgery.  She understands that for patients who are candidate for lumpectomy or mastectomy there is an equal survival rate with either technique, but a slightly higher local recurrence rate with lumpectomy. In addition she knows that a lumpectomy usually requires postoperative radiation as part of the management of the breast cancer.  We have discussed the likely postoperative course and plans for followup.  I have given the patient some written information that reviewed all of these issues. I believe her questions are answered and that she has a good understanding of the issues.   I believe she would be best served with bilateral mastectomies. The cancer on the right  side  is too large to have a good excision and appropriate cosmetic result. In addition to the known cancer on the left side there are some other probably benign areas but would need to be excised to make followup better. I discussed this at length with the patient and her family.review the situation with the medical radiation oncologist we feel that a right-sided central and to excision would be appropriate since this is a more aggressive side and mildly peter some postoperative radiation or chemotherapy after mastectomies. I discussed this with the patient again will go ahead and get this set up       Vyctoria Dickman J 04/21/2012, 9:26 AM

## 2012-05-10 NOTE — Anesthesia Procedure Notes (Signed)
Procedure Name: Intubation Date/Time: 05/10/2012 7:37 AM Performed by: Sherie Don Pre-anesthesia Checklist: Patient identified, Emergency Drugs available, Suction available, Patient being monitored and Timeout performed Patient Re-evaluated:Patient Re-evaluated prior to inductionOxygen Delivery Method: Circle system utilized Preoxygenation: Pre-oxygenation with 100% oxygen Intubation Type: IV induction Ventilation: Mask ventilation without difficulty and Oral airway inserted - appropriate to patient size Laryngoscope Size: Mac and 3 Grade View: Grade I Tube type: Oral Tube size: 7.5 mm Number of attempts: 1 Airway Equipment and Method: Stylet Placement Confirmation: ETT inserted through vocal cords under direct vision,  breath sounds checked- equal and bilateral and positive ETCO2 Secured at: 22 cm Tube secured with: Tape Dental Injury: Teeth and Oropharynx as per pre-operative assessment

## 2012-05-10 NOTE — Op Note (Signed)
KLARISA BARMAN [birth year] [id number] 04/26/2012  Preoperative diagnosis: right breast cancer, lower inner quadrant, clinical stage II; left breast cancer, upper outer quadrant, clinical stage I  Postoperative diagnosis: the same  Procedure: right modified radical mastectomy with the blue dye injection and identification of axillary sentinel lymph nodes; left total mastectomy  Surgeon: Currie Paris, MD, FACS  Assistant: Ralene Muskrat, PA-S  Anesthesia: General   Clinical History and Indications: this patient presented with a palpable visible mass in the lower-outer quadrant of the right breast cancer which is invasive cancer. A second cancer was found in the upper outer quadrant of the left breast associated with some skin dimpling. After consultations with medical and radiation oncologists and with the patient and her family we elected to proceed to a right total mastectomy and sentinel lymph node evaluation with possible axillary dissection and left total mastectomy.    Description of Procedure: I saw the patient in the preprocedure area and we confirmed the plans and I marked the right breast as the site for the sentinel lymph node. The patient was taken to the operating room after satisfactory general anesthesia was obtained a timeout was performed. I then injected 5 cc of dilute methylene blue in the subareolar area of the right breast and this was thoroughly massaged in. A full prep and drape was then done and a second timeout performed.  The neoprobe was used to identify a hot area in the axilla. I made the usual elliptical incision going somewhat obliquely to be sure I was well around the tumor medially. I raised the usual skin flap superiorly and was able to identify a blue lymphatic leading to a hot blue lymph node. A second hot lymph node was also removed and both sent for touch preps. They subsequently returned with the first one being positive.  While waiting I went ahead and  made the inferior incision in the usual inferior flap and remove the breast from medial to lateral taking the fascia. I irrigated made sure everything was dry. At this point the pathologist called with the pathology report as I went ahead with an extra dissection. Axillary contents were swept out from medial to lateral and superior to inferior. The second intercostal nerve was divided. The long thoracic and thoracodorsal nerves were preserved and tested it and it worked fine.  I then irrigated made sure everything was dry. Placed 219 Blake drains secured with a 2-0 nylon. A moist lap pack was placed.  I then did the left side making elliptical incision raising the skin flaps as before and remove the breast from medial to lateral. I avoided entering the axilla. There is no palpably adenopathy. I irrigated and placed a single 19 Blake drain. Everything appeared to be dry.  Both incisions were then closed with interrupted 3-0 Monocryl subcutaneous suture and Dermabond. They appear to be dry the entire time we are closing with no evidence of bleeding. Sterile dressings were applied. The patient tolerated the procedure well. There were no complications. Counts were correct. Estimated blood loss was 100cc or less.   Currie Paris, MD, FACS 05/10/2012 10:09 AM

## 2012-05-10 NOTE — Interval H&P Note (Signed)
History and Physical Interval Note:  05/10/2012 7:08 AM  Alison Gross  has presented today for surgery, with the diagnosis of bilateral breast cancer   The various methods of treatment have been discussed with the patient and family. After consideration of risks, benefits and other options for treatment, the patient has consented to  Procedure(s) with comments: TOTAL MASTECTOMY (Bilateral) AXILLARY SENTINEL lymph  NODE BIOPSY (Right) - nuclear medicine injection right side  7:00 am  as a surgical intervention .  The patient's history has been reviewed, patient examined, no change in status, stable for surgery.  I have reviewed the patient's chart and labs.  Questions were answered to the patient's satisfaction.     Marquita Lias J

## 2012-05-10 NOTE — Preoperative (Signed)
Beta Blockers   Reason not to administer Beta Blockers:Coreg taken this morning

## 2012-05-10 NOTE — Anesthesia Postprocedure Evaluation (Signed)
  Anesthesia Post-op Note  Patient: Alison Gross  Procedure(s) Performed: Procedure(s) with comments: RIGHT modified mastectomy; LEFT total mastectomy (Bilateral) AXILLARY SENTINEL lymph  NODE BIOPSY (Right) - nuclear medicine injection right side  7:00 am   Patient Location: PACU  Anesthesia Type:General  Level of Consciousness: awake and alert   Airway and Oxygen Therapy: Patient Spontanous Breathing  Post-op Pain: mild  Post-op Assessment: Post-op Vital signs reviewed, Patient's Cardiovascular Status Stable, Respiratory Function Stable, Patent Airway, No signs of Nausea or vomiting and Pain level controlled  Post-op Vital Signs: stable  Complications: No apparent anesthesia complications

## 2012-05-10 NOTE — Transfer of Care (Signed)
Immediate Anesthesia Transfer of Care Note  Patient: Alison Gross  Procedure(s) Performed: Procedure(s) with comments: RIGHT modified mastectomy; LEFT total mastectomy (Bilateral) AXILLARY SENTINEL lymph  NODE BIOPSY (Right) - nuclear medicine injection right side  7:00 am   Patient Location: PACU  Anesthesia Type:General  Level of Consciousness: awake, alert , oriented and patient cooperative  Airway & Oxygen Therapy: Patient Spontanous Breathing and Patient connected to nasal cannula oxygen  Post-op Assessment: Report given to PACU RN, Post -op Vital signs reviewed and stable and Patient moving all extremities X 4  Post vital signs: Reviewed and stable  Complications: No apparent anesthesia complications

## 2012-05-11 ENCOUNTER — Encounter (HOSPITAL_COMMUNITY): Payer: Self-pay | Admitting: Surgery

## 2012-05-11 LAB — GLUCOSE, CAPILLARY
Glucose-Capillary: 121 mg/dL — ABNORMAL HIGH (ref 70–99)
Glucose-Capillary: 88 mg/dL (ref 70–99)
Glucose-Capillary: 115 mg/dL — ABNORMAL HIGH (ref 70–99)

## 2012-05-11 NOTE — Progress Notes (Signed)
UR completed 

## 2012-05-11 NOTE — Progress Notes (Signed)
Patient instructed on JP drain emptying and care.  Returned demonstration of emptying JP and recording drainage amount.  Daughter also participated in teaching and drain care demonstration.  Verbalizes and demonstrates understanding with no further questions.

## 2012-05-11 NOTE — Progress Notes (Signed)
<  principal problem not specified>  Assessment: Table s/p bilateral mastectomy  Plan: Slow IV today, home in AM   Subjective: Minimal pain, just feels weak  Objective: Vital signs in last 24 hours: Temp:  [97.5 F (36.4 C)-99.7 F (37.6 C)] 99.7 F (37.6 C) (03/04 0518) Pulse Rate:  [49-82] 79 (03/04 0518) Resp:  [12-19] 18 (03/04 0518) BP: (109-178)/(50-74) 125/55 mmHg (03/04 0518) SpO2:  [94 %-100 %] 96 % (03/04 0518) Weight:  [160 lb (72.576 kg)] 160 lb (72.576 kg) (03/03 1320)    Intake/Output from previous day: 03/03 0701 - 03/04 0700 In: 2610.8 [P.O.:120; I.V.:2490.8] Out: 975 [Urine:350; Drains:595; Blood:30]  General appearance: alert, cooperative and no distress Resp: clear to auscultation bilaterally Cardio: regular rate and rhythm, S1, S2 normal, no murmur, click, rub or gallop Incision/Wound:Dresing dry, JP's thini  Lab Results:  Results for orders placed during the hospital encounter of 05/10/12 (from the past 24 hour(s))  GLUCOSE, CAPILLARY     Status: Abnormal   Collection Time    05/10/12 10:43 AM      Result Value Range   Glucose-Capillary 115 (*) 70 - 99 mg/dL     Studies/Results Radiology     MEDS, Scheduled . carvedilol  6.25 mg Oral BID WC  . heparin  5,000 Units Subcutaneous Q8H  . losartan  100 mg Oral Daily   And  . hydrochlorothiazide  12.5 mg Oral Daily       LOS: 1 day    Currie Paris, MD, [facility], [place] [phone]   05/11/2012 7:10 AM

## 2012-05-12 MED ORDER — OXYCODONE-ACETAMINOPHEN 5-325 MG PO TABS: 1.0000 | ORAL_TABLET | ORAL | Status: DC | PRN

## 2012-05-12 NOTE — Progress Notes (Signed)
Jp drain on right lateral side noted to be leaking around the site. Rn milked tubing and was still leaking around site. Sponge saturated. Awaiting call back from Dr. Jamey Ripa

## 2012-05-12 NOTE — Progress Notes (Signed)
Rn instructed to take original dressing off and replace with sterile gauze and tape. Rn will give tape and gauze to patient and instruct to change on as needed basis

## 2012-05-12 NOTE — Progress Notes (Signed)
Loose reinforcement dressing applied

## 2012-05-12 NOTE — Progress Notes (Signed)
  2 Days Post-Op   Assessment: s/p Procedure(s): RIGHT modified mastectomy; LEFT total mastectomy AXILLARY SENTINEL lymph  NODE BIOPSY Patient Active Problem List  Diagnosis  . Acute blood loss anemia  . Acute upper GI bleeding  . HTN (hypertension)  . OA (osteoarthritis) of knee  . Cancer of lower-inner quadrant of female breast    Doing well  Plan: Discharge  Subjective: Feels good, minimla pain, wants to go home.  Objective: Vital signs in last 24 hours: Temp:  [98.8 F (37.1 C)-99.6 F (37.6 C)] 98.8 F (37.1 C) (03/05 0542) Pulse Rate:  [70-79] 76 (03/05 0542) Resp:  [16-18] 18 (03/05 0542) BP: (111-124)/(51-61) 118/61 mmHg (03/05 0542) SpO2:  [95 %-100 %] 98 % (03/05 0542)   Intake/Output from previous day: 03/04 0701 - 03/05 0700 In: 1953 [P.O.:480; I.V.:1473] Out: 820 [Urine:300; Drains:520]  General appearance: alert, cooperative and no distress Resp: clear to auscultation bilaterally  Incision: healing well  Lab Results:  No results found for this basename: WBC, HGB, HCT, PLT,  in the last 72 hours BMET No results found for this basename: NA, K, CL, CO2, GLUCOSE, BUN, CREATININE, CALCIUM,  in the last 72 hours  MEDS, Scheduled . carvedilol  6.25 mg Oral BID WC  . heparin  5,000 Units Subcutaneous Q8H  . losartan  100 mg Oral Daily   And  . hydrochlorothiazide  12.5 mg Oral Daily    Studies/Results: No results found.    LOS: 2 days     Currie Paris, MD, [facility], [place] [phone]   05/12/2012 7:50 AM

## 2012-05-13 ENCOUNTER — Telehealth (INDEPENDENT_AMBULATORY_CARE_PROVIDER_SITE_OTHER): Payer: Self-pay | Admitting: General Surgery

## 2012-05-13 NOTE — Telephone Encounter (Signed)
Patient made aware of path results. Will follow up at appt and call with any questions prior.  

## 2012-05-13 NOTE — Telephone Encounter (Signed)
Message copied by Liliana Cline on Thu May 13, 2012  9:15 AM ------      Message from: Currie Paris      Created: Wed May 12, 2012  5:22 PM       Tell the patient that her margins are OK and her lymph nodes are negative except for the one sentinel node that we found in the OR. I will discuss in detail in the office. ------

## 2012-05-14 NOTE — Discharge Summary (Signed)
Patient ID: Alison Gross [id number] 79 y.o. 03/14/1932  Admission  Date: 05/10/2012  Discharge date and time: 05/12/2012 10:51 AM  Admitting Physician: Currie Paris  Discharge Physician: Currie Paris  Admission Diagnoses: bilateral breast cancer   Discharge Diagnoses: Right breast cancer, IDC, Stage 2 Left breast cancer, IDC, Stage 1  Operations: Procedure(s): RIGHT modified mastectomy; LEFT total mastectomy AXILLARY SENTINEL lymph  NODE BIOPSY  Discharged Condition: good  Hospital Course: The patient underwent bilateral mastectomy with full azxillary dissectin on right due to a positive SLN. She did well post op and was able to be discharged on the second post op day.  Consults: None  Significant Diagnostic Studies: Path: Diagnosis 1. Lymph node, sentinel, biopsy, Right Axillary - ONE LYMPH NODE POSITIVE FOR METASTATIC DUCTAL CARCINOMA (1/1). 2. Lymph node, sentinel, biopsy, Right Axillary - ONE BENIGN LYMPH NODE WITH NO TUMOR SEEN (0/1). 3. Breast, simple mastectomy, right - INVASIVE MIXED MUCINOUS AND GRADE I DUCTAL CARCINOMA, SPANNING 3.7 CM IN GREATEST DIMENSION. - FOCAL ATYPICAL DUCTAL HYPERPLASIA. - TUMOR IS LESS THAN 0.1 CM FROM DEEP MARGIN. - OTHER MARGINS ARE NEGATIVE. - SEE ONCOLOGY TEMPLATE. 4. Lymph nodes, regional resection, right axillary - FOURTEEN BENIGN LYMPH NODES WITH NO TUMOR SEEN (0/14). 5. Breast, simple mastectomy, left - LOW GRADE DUCTAL CARCINOMA IN SITU, MULTIFOCAL WITH LARGEST FOCUS MEASURING 2.6 CM IN GREATEST DIMENSION. - NO RESIDUAL INVASIVE CARCINOMA IDENTIFIED, SEE COMMENT. - MARGINS ARE NEGATIVE. - SEE ONCOLOGY TEMPLATE. Microscopic Comment 3. BREAST, INVASIVE TUMOR, WITH LYMPH NODE SAMPLING Specimen, including laterality: Right breast. Procedure: Right simple mastectomy. Histologic type: The majority of the tumor mass shows mucinous carcinoma with areas showing invasive grade I ductal carcinoma. Grade: I. Tubule  formation: I. Nuclear pleomorphism: II. Mitotic:II. Tumor size (gross measurement): 3.7 cm. Margins: 1 of 4 FINAL for SHONICA, WEIER ([id number]) Microscopic Comment(continued) Invasive, distance to closest margin: less than 0.1 cm from deep margin. Lymphovascular invasion: Although definitive lymph/vascular invasion is not identified within the right simple mastectomy specimen, one of the sentinel lymph nodes is positive for metastatic ductal carcinoma. Ductal carcinoma in situ: No. Lobular neoplasia: No. Tumor focality: Unifocal. Treatment effect: Not applicable. Extent of tumor: Tumor confined to breast parenchyma. Lymph nodes: # examined: 16. Lymph nodes with metastasis: 1. Macrometastasis: (> 2.0 mm): 1. Extracapsular extension: Not identified. Breast prognostic profile: Performed on previous case ([id number]) Estrogen receptor: 100%, positive. Progesterone receptor: 36%, positive. Her 2 neu: 1.20, no amplification. Ki-67: 32%. Non-neoplastic breast: Fibrocystic changes with usual ductal hyperplasia and associated calcification. TNM: pT2, pN1a, MX.   Disposition: Home

## 2012-05-20 ENCOUNTER — Ambulatory Visit (INDEPENDENT_AMBULATORY_CARE_PROVIDER_SITE_OTHER): Payer: Medicare Other | Admitting: Surgery

## 2012-05-20 ENCOUNTER — Encounter (INDEPENDENT_AMBULATORY_CARE_PROVIDER_SITE_OTHER): Payer: Self-pay | Admitting: Surgery

## 2012-05-20 VITALS — BP 130/72 | HR 79 | Temp 99.8°F | Resp 18 | Ht 65.0 in | Wt 151.4 lb

## 2012-05-20 DIAGNOSIS — Z09 Encounter for follow-up examination after completed treatment for conditions other than malignant neoplasm: Secondary | ICD-10-CM

## 2012-05-20 NOTE — Patient Instructions (Signed)
cONTINUE EMPTYING DRAINS AS YOU HAVE BEEN DOING AND SEE ME NEXT WEEK

## 2012-05-20 NOTE — Progress Notes (Signed)
Alison Gross    [id number] 05/20/2012    Apr 07, 1932   CC:   Chief Complaint  Patient presents with  . Routine Post Op    1st po bilat masty     HPI:  The patient returns for post op follow-up. She underwent a Right MRM and left TM on 3/3. Over all she feels that she is doing well. She has had no pain since surgery   PE: VITAL SIGNS: BP 130/72  Pulse 79  Temp(Src) 99.8 F (37.7 C) (Temporal)  Resp 18  Ht 5\' 5"  (1.651 m)  Wt 151 lb 6.4 oz (68.675 kg)  BMI 25.19 kg/m2  Breast: The incision is healing nicely and there is no evidence of infection or hematoma.  The drains are still too much to remove.  DATA REVIEWED: Pathology report: Diagnosis 1. Lymph node, sentinel, biopsy, Right Axillary - ONE LYMPH NODE POSITIVE FOR METASTATIC DUCTAL CARCINOMA (1/1). 2. Lymph node, sentinel, biopsy, Right Axillary - ONE BENIGN LYMPH NODE WITH NO TUMOR SEEN (0/1). 3. Breast, simple mastectomy, right - INVASIVE MIXED MUCINOUS AND GRADE I DUCTAL CARCINOMA, SPANNING 3.7 CM IN GREATEST DIMENSION. - FOCAL ATYPICAL DUCTAL HYPERPLASIA. - TUMOR IS LESS THAN 0.1 CM FROM DEEP MARGIN. - OTHER MARGINS ARE NEGATIVE. - SEE ONCOLOGY TEMPLATE. 4. Lymph nodes, regional resection, right axillary - FOURTEEN BENIGN LYMPH NODES WITH NO TUMOR SEEN (0/14). 1 of 5 FINAL for Alison Gross, Alison Gross ([id number]) Diagnosis(continued) 5. Breast, simple mastectomy, left - LOW GRADE DUCTAL CARCINOMA IN SITU, MULTIFOCAL WITH LARGEST FOCUS MEASURING 2.6 CM IN GREATEST DIMENSION. - NO RESIDUAL INVASIVE CARCINOMA IDENTIFIED, SEE COMMENT. - MARGINS ARE NEGATIVE. - SEE ONCOLOGY TEMPLATE.  IMPRESSION: Patient doing well.   PLAN: Her next visit will be in next week. Gave her a copy of path and discussed it with her.

## 2012-05-25 ENCOUNTER — Encounter (INDEPENDENT_AMBULATORY_CARE_PROVIDER_SITE_OTHER): Payer: Self-pay | Admitting: Surgery

## 2012-05-25 ENCOUNTER — Ambulatory Visit (INDEPENDENT_AMBULATORY_CARE_PROVIDER_SITE_OTHER): Payer: Medicare Other | Admitting: Surgery

## 2012-05-25 VITALS — BP 108/60 | HR 72 | Temp 97.3°F | Resp 14 | Ht 65.0 in | Wt 151.0 lb

## 2012-05-25 DIAGNOSIS — Z09 Encounter for follow-up examination after completed treatment for conditions other than malignant neoplasm: Secondary | ICD-10-CM

## 2012-05-25 NOTE — Patient Instructions (Signed)
See me next week. If the remaining drain slow, call Thursday and we will have you come over and have it removed

## 2012-05-25 NOTE — Progress Notes (Signed)
BERNEDA PICCININNI    [id number] 05/25/2012    01-17-1933   CC:   Chief Complaint  Patient presents with  . Routine Post Op    mastectomy with drains     HPI:  The patient returns for post op follow-up. She underwent a Right MRM and left TM on 3/3. Over all she feels that she is doing well. She has had no pain since surgery   PE: VITAL SIGNS: BP 108/60  Pulse 72  Temp(Src) 97.3 F (36.3 C) (Temporal)  Resp 14  Ht 5\' 5"  (1.651 m)  Wt 151 lb (68.493 kg)  BMI 25.13 kg/m2  Breast: The incision is healing nicely and there is no evidence of infection or hematoma.  The two right drains are putting minimal out, the left still about 40 cc    IMPRESSION: Patient doing well.   PLAN: Both right drains removed, left still in

## 2012-05-27 ENCOUNTER — Encounter (INDEPENDENT_AMBULATORY_CARE_PROVIDER_SITE_OTHER): Payer: Self-pay | Admitting: Surgery

## 2012-05-27 ENCOUNTER — Ambulatory Visit (INDEPENDENT_AMBULATORY_CARE_PROVIDER_SITE_OTHER): Payer: Medicare Other | Admitting: Surgery

## 2012-05-27 VITALS — BP 104/60 | HR 84 | Resp 14

## 2012-05-27 DIAGNOSIS — Z09 Encounter for follow-up examination after completed treatment for conditions other than malignant neoplasm: Secondary | ICD-10-CM

## 2012-05-27 NOTE — Progress Notes (Signed)
Cones for possible drain rmoval. The drain is still about 40 cc a day so left in situ.  The right side has accumulated a seroma - aspirated 25 cc clear fluid.  Pl;an - will see again next week.

## 2012-06-01 ENCOUNTER — Ambulatory Visit (INDEPENDENT_AMBULATORY_CARE_PROVIDER_SITE_OTHER): Payer: Medicare Other | Admitting: Surgery

## 2012-06-01 ENCOUNTER — Encounter (INDEPENDENT_AMBULATORY_CARE_PROVIDER_SITE_OTHER): Payer: Self-pay | Admitting: Surgery

## 2012-06-01 VITALS — BP 134/62 | HR 68 | Temp 97.3°F | Resp 16 | Ht 65.0 in | Wt 155.0 lb

## 2012-06-01 DIAGNOSIS — Z09 Encounter for follow-up examination after completed treatment for conditions other than malignant neoplasm: Secondary | ICD-10-CM

## 2012-06-01 NOTE — Progress Notes (Signed)
Chief complaint: Postop followup after bilateral mastectomies  History of present illness: This patient comes in with still one drain but this has slowed down. The other side was out but I have asked for his room on her last visit.  Exam: Both incisions are healing nicely. There is a cerumen on the right side. The left side is dry. There is minimal drainage in the JP.  Impression: #1 seroma right side #2 left-sided drain ready to pull  Plan: I aspirated 75 cc from the right breast. A left-sided JP was pulled.  Will have her come back next week.

## 2012-06-01 NOTE — Patient Instructions (Signed)
See me again next week 

## 2012-06-02 ENCOUNTER — Other Ambulatory Visit: Payer: Self-pay | Admitting: Physician Assistant

## 2012-06-02 DIAGNOSIS — C50319 Malignant neoplasm of lower-inner quadrant of unspecified female breast: Secondary | ICD-10-CM

## 2012-06-03 ENCOUNTER — Telehealth: Payer: Self-pay | Admitting: Oncology

## 2012-06-03 ENCOUNTER — Ambulatory Visit (HOSPITAL_BASED_OUTPATIENT_CLINIC_OR_DEPARTMENT_OTHER): Payer: Medicare Other | Admitting: Oncology

## 2012-06-03 ENCOUNTER — Other Ambulatory Visit (HOSPITAL_BASED_OUTPATIENT_CLINIC_OR_DEPARTMENT_OTHER): Payer: Medicare Other | Admitting: Lab

## 2012-06-03 VITALS — BP 145/75 | HR 67 | Temp 98.3°F | Resp 20 | Ht 65.0 in | Wt 155.3 lb

## 2012-06-03 DIAGNOSIS — C50311 Malignant neoplasm of lower-inner quadrant of right female breast: Secondary | ICD-10-CM

## 2012-06-03 DIAGNOSIS — C50919 Malignant neoplasm of unspecified site of unspecified female breast: Secondary | ICD-10-CM

## 2012-06-03 DIAGNOSIS — C50319 Malignant neoplasm of lower-inner quadrant of unspecified female breast: Secondary | ICD-10-CM

## 2012-06-03 DIAGNOSIS — Z170 Estrogen receptor positive status [ER+]: Secondary | ICD-10-CM

## 2012-06-03 LAB — CBC WITH DIFFERENTIAL/PLATELET
BASO%: 1 % (ref 0.0–2.0)
EOS%: 3.9 % (ref 0.0–7.0)
LYMPH%: 23.4 % (ref 14.0–49.7)
MCHC: 33.4 g/dL (ref 31.5–36.0)
MCV: 85.4 fL (ref 79.5–101.0)
MONO%: 11.8 % (ref 0.0–14.0)
Platelets: 189 10*3/uL (ref 145–400)
RBC: 3.37 10*6/uL — ABNORMAL LOW (ref 3.70–5.45)

## 2012-06-03 LAB — COMPREHENSIVE METABOLIC PANEL (CC13)
ALT: 17 U/L (ref 0–55)
AST: 23 U/L (ref 5–34)
Alkaline Phosphatase: 66 U/L (ref 40–150)
Glucose: 85 mg/dl (ref 70–99)
Sodium: 144 mEq/L (ref 136–145)
Total Bilirubin: 0.72 mg/dL (ref 0.20–1.20)
Total Protein: 6.7 g/dL (ref 6.4–8.3)

## 2012-06-03 NOTE — Telephone Encounter (Signed)
gv pt appt schedule for May and appt w/Dr. Michell Heinrich for tomorrow 3/28 @ 3:15pm. S/w Clydie Braun she will add appt.
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
## 2012-06-04 ENCOUNTER — Ambulatory Visit
Admission: RE | Admit: 2012-06-04 | Discharge: 2012-06-04 | Disposition: A | Discharge status: Home / Self Care | Payer: Medicare Other | Source: Ambulatory Visit | Attending: Radiation Oncology | Admitting: Radiation Oncology

## 2012-06-04 ENCOUNTER — Encounter: Payer: Self-pay | Admitting: Radiation Oncology

## 2012-06-04 VITALS — BP 128/76 | HR 65 | Temp 98.3°F | Resp 20 | Wt 156.0 lb

## 2012-06-04 DIAGNOSIS — Z170 Estrogen receptor positive status [ER+]: Secondary | ICD-10-CM | POA: Insufficient documentation

## 2012-06-04 DIAGNOSIS — C50919 Malignant neoplasm of unspecified site of unspecified female breast: Secondary | ICD-10-CM | POA: Insufficient documentation

## 2012-06-04 DIAGNOSIS — C773 Secondary and unspecified malignant neoplasm of axilla and upper limb lymph nodes: Secondary | ICD-10-CM | POA: Insufficient documentation

## 2012-06-04 DIAGNOSIS — C50311 Malignant neoplasm of lower-inner quadrant of right female breast: Secondary | ICD-10-CM

## 2012-06-04 DIAGNOSIS — D0590 Unspecified type of carcinoma in situ of unspecified breast: Secondary | ICD-10-CM | POA: Insufficient documentation

## 2012-06-04 DIAGNOSIS — Z79899 Other long term (current) drug therapy: Secondary | ICD-10-CM | POA: Insufficient documentation

## 2012-06-04 DIAGNOSIS — Z9010 Acquired absence of unspecified breast and nipple: Secondary | ICD-10-CM | POA: Insufficient documentation

## 2012-06-04 NOTE — Progress Notes (Signed)
Pt denies pain, fatigue, loss of appetite. She is married, has 2 daughters, 1 granddaughter. 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

## 2012-06-04 NOTE — Progress Notes (Signed)
Please see the Nurse Progress Note in the MD Initial Consult Encounter for this patient. 

## 2012-06-04 NOTE — Progress Notes (Signed)
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  I originally saw her in the breast clinic where we've recommended bilateral mastectomies. She underwent this surgery on March 3 and was found to have a 3.7 cm right mucinous and ductal carcinoma. Her margins were close at 0.1 cm. One axillary node was positive for metastatic ductal carcinoma. This was a macro metastases. 14 other lymph nodes had no malignancy. On the left she had DCIS with negative margins. She has seen Dr. Daneil Dolin not who has not recommended chemotherapy. He recommended antiestrogen therapy. Because of her solitary positive lymph node she was referred to me for consideration of adjuvant radiation to the chest wall. She is feeling well and has had her drains removed. She has very minimal pain. She has good range of motion of her right arm. She's not noticed any swelling. She is closing on a house at the end of April and would like to delay any treatment until after that time if at all possible.  Allergies: No Known Allergies  Medications:  Current Outpatient Prescriptions  Medication Sig Dispense Refill  . carvedilol (COREG) 6.25 MG tablet Take 6.25 mg by mouth 2 (two) times daily with a meal.      . Cholecalciferol (VITAMIN D) 2000 UNITS CAPS Take 2,000 Units by mouth daily.       . Iron-FA-B Cmp-C-Biot-Probiotic (FUSION PLUS) CAPS Take 1 tablet by mouth daily.      Marland Kitchen linagliptin (TRADJENTA) 5 MG TABS tablet Take 5 mg by mouth daily.      Marland Kitchen losartan-hydrochlorothiazide (HYZAAR) 100-12.5 MG per tablet Take 1 tablet by mouth daily.      . Multiple Vitamin (MULTIVITAMIN WITH MINERALS) TABS Take 1 tablet by mouth  daily.      . pantoprazole (PROTONIX) 40 MG tablet Take 40 mg by mouth daily.      . rosuvastatin (CRESTOR) 20 MG tablet Take 20 mg by mouth every evening.       No current facility-administered medications for this encounter.    Physical Exam:  Filed Vitals:   06/04/12 1527  BP: 128/76  Pulse: 65  Temp: 98.3 F (36.8 C)  Resp: 20   she has bilateral mastectomy scars which are well healed. She is alert and oriented x3.  IMPRESSION: Alison Gross is a 79 y.o. female status post bilateral mastectomies with a node positive and close margins on the right  PLAN:  I spoke with Alison Gross today about adjuvant radiation. I discussed with her that her chance of local recurrence in the right breast is probably 10-15% at 10 years. She is not receiving chemotherapy. I told her radiation to decrease the risk of this recurrence probably down to 5% or less. I don't see an indication for radiation on the left. She really would like to proceed forward with anything they can help decrease her chance of recurrence. For that reason she has elected to proceed on with radiation to the right chest wall. We discussed the process of simulation the placement tattoos. I gave her handout regarding this. We discussed skin erythema and darkness which can be temporary or permanent. We  discussed fatigue. We discussed the interference of radiation with some forms of reconstruction. She has already met with plastic surgery and declined reconstruction at this time. We discussed the possibility of rib and lung damage. She has signed informed consent. I schedule her for simulation the end of April. We will then begin her treatment in May. We will treat her chest wall and supraclavicular lymph nodes. I don't see an indication to treat her axilla.    Lurline Hare, MD

## 2012-06-04 NOTE — Addendum Note (Signed)
Encounter addended by: Delynn Flavin, RN on: 06/04/2012  6:19 PM<BR>     Documentation filed: Charges VN

## 2012-06-07 ENCOUNTER — Telehealth: Payer: Self-pay | Admitting: Oncology

## 2012-06-07 NOTE — Telephone Encounter (Signed)
Sent letter to Dr. Maggie Font office from Dr. Darnelle Catalan

## 2012-06-09 ENCOUNTER — Encounter (INDEPENDENT_AMBULATORY_CARE_PROVIDER_SITE_OTHER): Payer: Self-pay | Admitting: Surgery

## 2012-06-09 ENCOUNTER — Ambulatory Visit (INDEPENDENT_AMBULATORY_CARE_PROVIDER_SITE_OTHER): Payer: Medicare Other | Admitting: Surgery

## 2012-06-09 VITALS — BP 110/68 | HR 68 | Temp 99.2°F | Resp 18 | Ht 65.0 in | Wt 154.4 lb

## 2012-06-09 DIAGNOSIS — Z09 Encounter for follow-up examination after completed treatment for conditions other than malignant neoplasm: Secondary | ICD-10-CM

## 2012-06-09 NOTE — Progress Notes (Signed)
Alison Gross       DOB: 17-Jul-1932           DATE: 06/09/2012       UEA:[id number]  CC:  Chief Complaint  Patient presents with  . Routine Post Op    reck masty    HPI: 2 status post bilateral mastectomies and comes in for a routine postop visit following removal of her last drain last week.  EXAM: Vital signs: BP 110/68  Pulse 68  Temp(Src) 99.2 F (37.3 C) (Temporal)  Resp 18  Ht 5\' 5"  (1.651 m)  Wt 154 lb 6.4 oz (70.035 kg)  BMI 25.69 kg/m2  General: Patient alert, oriented, NAD  Breasts: Both incisions are healing nicely. She does have a seromas bilaterally.I aspirated 35 cc from the right 55 from the left. Both fluids are clear. Appear to get all the fluid out each side. IMP: normal post mastectomy seromas  PLAN: we'll see again next week   [name] J 06/09/2012

## 2012-06-09 NOTE — Patient Instructions (Signed)
See me again next week 

## 2012-06-16 ENCOUNTER — Ambulatory Visit (INDEPENDENT_AMBULATORY_CARE_PROVIDER_SITE_OTHER): Payer: Medicare Other | Admitting: Surgery

## 2012-06-16 ENCOUNTER — Encounter (INDEPENDENT_AMBULATORY_CARE_PROVIDER_SITE_OTHER): Payer: Self-pay | Admitting: Surgery

## 2012-06-16 VITALS — BP 142/78 | HR 69 | Temp 96.6°F | Ht 65.5 in | Wt 153.6 lb

## 2012-06-16 DIAGNOSIS — Z09 Encounter for follow-up examination after completed treatment for conditions other than malignant neoplasm: Secondary | ICD-10-CM

## 2012-06-16 NOTE — Patient Instructions (Signed)
See me again in six weeks.       ABC CLASS After Breast Cancer Class  After Breast Cancer Class is a specially designed exercise class to assist you in a safe recovery after having breast cancer surgery.  In this class you will learn how to get back to full function whether your drains were just removed or if you had surgery a month ago.  This one-time class is held the 1st and 3rd Monday of every month from 11:00 a.m. until 12:00 noon at the [facility] located at [facility].  This class is FREE and space is limited.  For more information or to register for the next available class, call [phone].  Class Goals   Understand specific stretches to improve the flexibility of your chest and shoulder.   Learn ways to safely strengthen your upper body and improve your posture.   Understand the warning signs of infection and why you may be at risk for an arm infection.   Learn about Lymphedema and prevention.  **You do not attend this class until after surgery.  Drains must be removed to participate.     Micheline Maze, PT, CLT Dwaine Gale, PT, CLT

## 2012-06-16 NOTE — Progress Notes (Signed)
[name]       DOB: 1932-11-17           DATE: 06/16/2012       ZOX:[id number]  CC:  Chief Complaint  Patient presents with  . Follow-up    reck br    HPI: 2 status post bilateral mastectomies and comes in for a routine postop visit following aspirations of seromas  EXAM: Vital signs: BP 142/78  Pulse 69  Temp(Src) 96.6 F (35.9 C) (Temporal)  Ht 5' 5.5" (1.664 m)  Wt 153 lb 9.6 oz (69.673 kg)  BMI 25.16 kg/m2  SpO2 96%  General: Patient alert, oriented, NAD  Breasts: Both incisions are healing nicely. No residual seroma IMP: doing well  PLAN: we'll see again nin six weeks Encouraged ABC class   Meili Kleckley J 06/16/2012

## 2012-07-01 ENCOUNTER — Encounter: Payer: Self-pay | Admitting: *Deleted

## 2012-07-01 NOTE — Progress Notes (Signed)
Mailed after appt letter to pt. 

## 2012-07-06 ENCOUNTER — Ambulatory Visit
Admission: RE | Admit: 2012-07-06 | Discharge: 2012-07-06 | Disposition: A | Discharge status: Home / Self Care | Payer: Medicare Other | Source: Ambulatory Visit | Attending: Radiation Oncology | Admitting: Radiation Oncology

## 2012-07-06 DIAGNOSIS — C50311 Malignant neoplasm of lower-inner quadrant of right female breast: Secondary | ICD-10-CM

## 2012-07-06 DIAGNOSIS — C50319 Malignant neoplasm of lower-inner quadrant of unspecified female breast: Secondary | ICD-10-CM | POA: Insufficient documentation

## 2012-07-06 DIAGNOSIS — Z510 Encounter for antineoplastic radiation therapy: Secondary | ICD-10-CM | POA: Insufficient documentation

## 2012-07-06 DIAGNOSIS — Z79899 Other long term (current) drug therapy: Secondary | ICD-10-CM | POA: Insufficient documentation

## 2012-07-06 NOTE — Progress Notes (Signed)
Name: MARDIE KELLEN   MRN: [id number]  Date:  07/06/2012  DOB: 1933-02-06  Status:outpatient    DIAGNOSIS: Breast cancer.  CONSENT VERIFIED: yes   SET UP: Patient is setup supine   IMMOBILIZATION:  The following immobilization was used:Custom Moldable Pillow, breast board.   NARRATIVE: Ms. Viger was brought to the CT Simulation planning suite.  She has finished moving her home and is ready to proceed with radiation.  Identity was confirmed.  All relevant records and images related to the planned course of therapy were reviewed.  Then, the patient was positioned in a stable reproducible clinical set-up for radiation therapy.  Wires were placed to delineate the clinical extent of breast tissue. A wire was placed on the scar as well.  CT images were obtained.  An isocenter was placed. Skin markings were placed.  The CT images were loaded into the planning software where the target and avoidance structures were contoured.  The radiation prescription was entered and confirmed. The patient was discharged in stable condition and tolerated simulation well.    TREATMENT PLANNING NOTE:  Treatment planning then occurred. I have requested : MLC's, isodose plan, basic dose calculation  I personally designed and supervised the construction of 4 medically necessary complex treatment devices for the protection of critical normal structures including the lungs and contralateral breast as well as the immobilization device which is necessary for set up certainty.

## 2012-07-12 ENCOUNTER — Ambulatory Visit
Admission: RE | Admit: 2012-07-12 | Discharge: 2012-07-12 | Disposition: A | Discharge status: Home / Self Care | Payer: Medicare Other | Source: Ambulatory Visit | Attending: Radiation Oncology | Admitting: Radiation Oncology

## 2012-07-12 DIAGNOSIS — C50912 Malignant neoplasm of unspecified site of left female breast: Secondary | ICD-10-CM

## 2012-07-12 MED ORDER — RADIAPLEXRX EX GEL
Freq: Once | CUTANEOUS | Status: AC
  Administered 2012-07-12: 16:00:00 via TOPICAL

## 2012-07-12 MED ORDER — ALRA NON-METALLIC DEODORANT (RAD-ONC)
1.0000 "application " | Freq: Once | TOPICAL | Status: AC
  Administered 2012-07-12: 1 via TOPICAL

## 2012-07-12 NOTE — Progress Notes (Signed)
Patient education, radiation therapy and you book, alra, radiaplex gel ,calendar,my business card, leaflet on skin care products,when to use, daily after rad txs, and prn, just not 4 hours prior to treatment, fatigue, skin irritation, pain, diet, discussed, will se MD Eyvonne Mechanic RN weekly/prn, can call for any questions/concerns 4:13 PM

## 2012-07-13 ENCOUNTER — Ambulatory Visit
Admission: RE | Admit: 2012-07-13 | Discharge: 2012-07-13 | Disposition: A | Discharge status: Home / Self Care | Payer: Medicare Other | Source: Ambulatory Visit | Attending: Radiation Oncology | Admitting: Radiation Oncology

## 2012-07-13 ENCOUNTER — Encounter: Payer: Self-pay | Admitting: Radiation Oncology

## 2012-07-13 VITALS — BP 146/71 | HR 67 | Temp 97.9°F | Resp 20 | Wt 151.9 lb

## 2012-07-13 DIAGNOSIS — C50311 Malignant neoplasm of lower-inner quadrant of right female breast: Secondary | ICD-10-CM

## 2012-07-13 NOTE — Progress Notes (Signed)
Patient  Here weekly rad txs, completed 2  Right cw/sub claviam, alert,oriented x3, steady gait, no c/o pain or discomfort, using radiaplex gel given yesterday, eating well, drinking plenty fluids 2:50 PM

## 2012-07-13 NOTE — Progress Notes (Addendum)
Weekly Management Note Current Dose:  3.6 Gy  Projected Dose: 60.4 Gy   Narrative:  The patient presents for routine under treatment assessment.  CBCT/MVCT images/Port film x-rays were reviewed.  The chart was checked. Doing well. No complaints.   Physical Findings: Weight: 151 lb 14.4 oz (68.901 kg). Unchanged  Impression:  The patient is tolerating radiation.  Plan:  Continue treatment as planned.

## 2012-07-14 ENCOUNTER — Ambulatory Visit
Admission: RE | Admit: 2012-07-14 | Discharge: 2012-07-14 | Disposition: A | Discharge status: Home / Self Care | Payer: Medicare Other | Source: Ambulatory Visit | Attending: Radiation Oncology | Admitting: Radiation Oncology

## 2012-07-15 ENCOUNTER — Ambulatory Visit
Admission: RE | Admit: 2012-07-15 | Discharge: 2012-07-15 | Disposition: A | Discharge status: Home / Self Care | Payer: Medicare Other | Source: Ambulatory Visit | Attending: Radiation Oncology | Admitting: Radiation Oncology

## 2012-07-16 ENCOUNTER — Ambulatory Visit
Admission: RE | Admit: 2012-07-16 | Discharge: 2012-07-16 | Disposition: A | Discharge status: Home / Self Care | Payer: Medicare Other | Source: Ambulatory Visit | Attending: Radiation Oncology | Admitting: Radiation Oncology

## 2012-07-17 ENCOUNTER — Ambulatory Visit
Admission: RE | Admit: 2012-07-17 | Discharge: 2012-07-17 | Disposition: A | Discharge status: Home / Self Care | Payer: Medicare Other | Source: Ambulatory Visit | Attending: Radiation Oncology | Admitting: Radiation Oncology

## 2012-07-19 ENCOUNTER — Ambulatory Visit
Admission: RE | Admit: 2012-07-19 | Discharge: 2012-07-19 | Disposition: A | Discharge status: Home / Self Care | Payer: Medicare Other | Source: Ambulatory Visit | Attending: Radiation Oncology | Admitting: Radiation Oncology

## 2012-07-20 ENCOUNTER — Encounter: Payer: Self-pay | Admitting: Radiation Oncology

## 2012-07-20 ENCOUNTER — Ambulatory Visit
Admission: RE | Admit: 2012-07-20 | Discharge: 2012-07-20 | Disposition: A | Discharge status: Home / Self Care | Payer: Medicare Other | Source: Ambulatory Visit | Attending: Radiation Oncology | Admitting: Radiation Oncology

## 2012-07-20 VITALS — BP 122/54 | HR 72 | Temp 97.8°F | Resp 20 | Wt 153.0 lb

## 2012-07-20 DIAGNOSIS — C50311 Malignant neoplasm of lower-inner quadrant of right female breast: Secondary | ICD-10-CM

## 2012-07-20 NOTE — Progress Notes (Signed)
Weekly Management Note Current Dose:  12.6 Gy  Projected Dose: 60.4 Gy   Narrative:  The patient presents for routine under treatment assessment.  CBCT/MVCT images/Port film x-rays were reviewed.  The chart was checked. Doing well. No complaints. Wondered if it was ok to take calcium and vit d with rt.   Physical Findings: Weight: 153 lb (69.4 kg). Unchanged  Impression:  The patient is tolerating radiation.  Plan:  Continue treatment as planned. Ok to take calcium and vit d.

## 2012-07-20 NOTE — Progress Notes (Signed)
Weekly rad tx right chest wall/subclavian area,  7 txs so far, alert oriented x3, tanning,skin intact, no c/o pain or fatuigue,great appetite, using radiaplex gel bid, had bone density exam yesterday, Dr. Allyne Gee office

## 2012-07-21 ENCOUNTER — Ambulatory Visit
Admission: RE | Admit: 2012-07-21 | Discharge: 2012-07-21 | Disposition: A | Discharge status: Home / Self Care | Payer: Medicare Other | Source: Ambulatory Visit | Attending: Radiation Oncology | Admitting: Radiation Oncology

## 2012-07-22 ENCOUNTER — Ambulatory Visit
Admission: RE | Admit: 2012-07-22 | Discharge: 2012-07-22 | Disposition: A | Discharge status: Home / Self Care | Payer: Medicare Other | Source: Ambulatory Visit | Attending: Radiation Oncology | Admitting: Radiation Oncology

## 2012-07-23 ENCOUNTER — Ambulatory Visit
Admission: RE | Admit: 2012-07-23 | Discharge: 2012-07-23 | Disposition: A | Discharge status: Home / Self Care | Payer: Medicare Other | Source: Ambulatory Visit | Attending: Radiation Oncology | Admitting: Radiation Oncology

## 2012-07-23 ENCOUNTER — Encounter (INDEPENDENT_AMBULATORY_CARE_PROVIDER_SITE_OTHER): Payer: Self-pay | Admitting: Surgery

## 2012-07-23 ENCOUNTER — Ambulatory Visit (INDEPENDENT_AMBULATORY_CARE_PROVIDER_SITE_OTHER): Payer: Medicare Other | Admitting: Surgery

## 2012-07-23 VITALS — BP 128/60 | HR 71 | Temp 96.4°F | Ht 64.0 in | Wt 152.2 lb

## 2012-07-23 DIAGNOSIS — Z09 Encounter for follow-up examination after completed treatment for conditions other than malignant neoplasm: Secondary | ICD-10-CM

## 2012-07-23 NOTE — Progress Notes (Signed)
[name]       DOB: December 21, 1932           DATE: 07/23/2012       EAV:[id number]  CC:  Chief Complaint  Patient presents with  . Follow-up    reck bil masty    HPI: 2 status post bilateral mastectomies and comes in for a routine postop visit.Has started radiation  EXAM: Vital signs: BP 128/60  Pulse 71  Temp(Src) 96.4 F (35.8 C) (Temporal)  Ht 5\' 4"  (1.626 m)  Wt 152 lb 3.2 oz (69.037 kg)  BMI 26.11 kg/m2  General: Patient alert, oriented, NAD  Breasts: Both incisions are healing nicely. No residual seroma IMP: doing well  PLAN: we'll see again in three months    Marcena Dias J 07/23/2012

## 2012-07-23 NOTE — Patient Instructions (Signed)
See me again in 3 months.

## 2012-07-26 ENCOUNTER — Ambulatory Visit
Admission: RE | Admit: 2012-07-26 | Discharge: 2012-07-26 | Disposition: A | Discharge status: Home / Self Care | Payer: Medicare Other | Source: Ambulatory Visit | Attending: Radiation Oncology | Admitting: Radiation Oncology

## 2012-07-27 ENCOUNTER — Ambulatory Visit
Admission: RE | Admit: 2012-07-27 | Discharge: 2012-07-27 | Disposition: A | Discharge status: Home / Self Care | Payer: Medicare Other | Source: Ambulatory Visit | Attending: Radiation Oncology | Admitting: Radiation Oncology

## 2012-07-28 ENCOUNTER — Ambulatory Visit
Admission: RE | Admit: 2012-07-28 | Discharge: 2012-07-28 | Disposition: A | Discharge status: Home / Self Care | Payer: Medicare Other | Source: Ambulatory Visit | Attending: Radiation Oncology | Admitting: Radiation Oncology

## 2012-07-29 ENCOUNTER — Ambulatory Visit
Admission: RE | Admit: 2012-07-29 | Discharge: 2012-07-29 | Disposition: A | Discharge status: Home / Self Care | Payer: Medicare Other | Source: Ambulatory Visit | Attending: Radiation Oncology | Admitting: Radiation Oncology

## 2012-07-30 ENCOUNTER — Encounter: Payer: Self-pay | Admitting: Radiation Oncology

## 2012-07-30 ENCOUNTER — Ambulatory Visit
Admission: RE | Admit: 2012-07-30 | Discharge: 2012-07-30 | Disposition: A | Discharge status: Home / Self Care | Payer: Medicare Other | Source: Ambulatory Visit | Attending: Radiation Oncology | Admitting: Radiation Oncology

## 2012-07-30 VITALS — BP 134/68 | HR 68 | Temp 98.1°F | Resp 20 | Wt 152.8 lb

## 2012-07-30 DIAGNOSIS — C50311 Malignant neoplasm of lower-inner quadrant of right female breast: Secondary | ICD-10-CM

## 2012-07-30 NOTE — Progress Notes (Addendum)
Weekly rad txs, 15 completed rt cw, hyperpigmentation only,skin intact, good appetite,  Drinks plenty fluids, some fatigue,  No pain, uses radiaplex bid  8:52 AM

## 2012-07-30 NOTE — Progress Notes (Signed)
Weekly Management Note Current Dose:  27 Gy  Projected Dose: 60.4 Gy   Narrative:  The patient presents for routine under treatment assessment.  CBCT/MVCT images/Port film x-rays were reviewed.  The chart was checked. Doing well. No complaints.   Physical Findings: Weight: 152 lb 12.8 oz (69.31 kg). Unchanged. Slightly dark right chest wall.  Impression:  The patient is tolerating radiation.  Plan:  Continue treatment as planned. Continue radiaplex.

## 2012-08-03 ENCOUNTER — Ambulatory Visit
Admission: RE | Admit: 2012-08-03 | Discharge: 2012-08-03 | Disposition: A | Discharge status: Home / Self Care | Payer: Medicare Other | Source: Ambulatory Visit | Attending: Radiation Oncology | Admitting: Radiation Oncology

## 2012-08-03 DIAGNOSIS — C50311 Malignant neoplasm of lower-inner quadrant of right female breast: Secondary | ICD-10-CM

## 2012-08-03 NOTE — Progress Notes (Signed)
Weekly rad txs, rcw, 16 completed, hyperpigmentation skin intact, soreness under axilla, very mld,  9:25 AM

## 2012-08-03 NOTE — Progress Notes (Signed)
Weekly Management Note Current Dose: 28.8  Gy  Projected Dose: 50.4 Gy   Narrative:  The patient presents for routine under treatment assessment.  CBCT/MVCT images/Port film x-rays were reviewed.  The chart was checked. Doing well. Some tightness in axilla. Using radiaplex.  Physical Findings: slightly dark chest wall.   Impression:  The patient is tolerating radiation.  Plan:  Continue treatment as planned.

## 2012-08-04 ENCOUNTER — Ambulatory Visit
Admission: RE | Admit: 2012-08-04 | Discharge: 2012-08-04 | Disposition: A | Discharge status: Home / Self Care | Payer: Medicare Other | Source: Ambulatory Visit | Attending: Radiation Oncology | Admitting: Radiation Oncology

## 2012-08-05 ENCOUNTER — Telehealth: Payer: Self-pay | Admitting: Oncology

## 2012-08-05 ENCOUNTER — Other Ambulatory Visit (HOSPITAL_BASED_OUTPATIENT_CLINIC_OR_DEPARTMENT_OTHER): Payer: Medicare Other | Admitting: Lab

## 2012-08-05 ENCOUNTER — Ambulatory Visit
Admission: RE | Admit: 2012-08-05 | Discharge: 2012-08-05 | Disposition: A | Discharge status: Home / Self Care | Payer: Medicare Other | Source: Ambulatory Visit | Attending: Radiation Oncology | Admitting: Radiation Oncology

## 2012-08-05 ENCOUNTER — Ambulatory Visit (HOSPITAL_BASED_OUTPATIENT_CLINIC_OR_DEPARTMENT_OTHER): Payer: Medicare Other | Admitting: Oncology

## 2012-08-05 VITALS — BP 153/76 | HR 67 | Temp 97.9°F | Resp 20 | Ht 64.0 in | Wt 152.9 lb

## 2012-08-05 DIAGNOSIS — C50919 Malignant neoplasm of unspecified site of unspecified female breast: Secondary | ICD-10-CM

## 2012-08-05 DIAGNOSIS — C50319 Malignant neoplasm of lower-inner quadrant of unspecified female breast: Secondary | ICD-10-CM

## 2012-08-05 DIAGNOSIS — C50311 Malignant neoplasm of lower-inner quadrant of right female breast: Secondary | ICD-10-CM

## 2012-08-05 LAB — COMPREHENSIVE METABOLIC PANEL (CC13)
ALT: 21 U/L (ref 0–55)
CO2: 30 mEq/L — ABNORMAL HIGH (ref 22–29)
Creatinine: 1.2 mg/dL — ABNORMAL HIGH (ref 0.6–1.1)
Glucose: 128 mg/dl — ABNORMAL HIGH (ref 70–99)
Total Bilirubin: 0.62 mg/dL (ref 0.20–1.20)

## 2012-08-05 LAB — CBC WITH DIFFERENTIAL/PLATELET
Eosinophils Absolute: 0.2 10*3/uL (ref 0.0–0.5)
LYMPH%: 11.1 % — ABNORMAL LOW (ref 14.0–49.7)
MCV: 87.6 fL (ref 79.5–101.0)
MONO%: 9.9 % (ref 0.0–14.0)
NEUT#: 3.2 10*3/uL (ref 1.5–6.5)
Platelets: 196 10*3/uL (ref 145–400)
RBC: 3.86 10*6/uL (ref 3.70–5.45)
nRBC: 0 % (ref 0–0)

## 2012-08-05 MED ORDER — TAMOXIFEN CITRATE 20 MG PO TABS: 20.0000 mg | ORAL_TABLET | Freq: Every day | ORAL | Status: AC

## 2012-08-05 NOTE — Telephone Encounter (Signed)
gv pt appt schedule for July.  
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
## 2012-08-06 ENCOUNTER — Ambulatory Visit
Admission: RE | Admit: 2012-08-06 | Discharge: 2012-08-06 | Disposition: A | Discharge status: Home / Self Care | Payer: Medicare Other | Source: Ambulatory Visit | Attending: Radiation Oncology | Admitting: Radiation Oncology

## 2012-08-09 ENCOUNTER — Ambulatory Visit
Admission: RE | Admit: 2012-08-09 | Discharge: 2012-08-09 | Disposition: A | Discharge status: Home / Self Care | Payer: Medicare Other | Source: Ambulatory Visit | Attending: Radiation Oncology | Admitting: Radiation Oncology

## 2012-08-10 ENCOUNTER — Ambulatory Visit
Admission: RE | Admit: 2012-08-10 | Discharge: 2012-08-10 | Disposition: A | Discharge status: Home / Self Care | Payer: Medicare Other | Source: Ambulatory Visit | Attending: Radiation Oncology | Admitting: Radiation Oncology

## 2012-08-10 DIAGNOSIS — C50311 Malignant neoplasm of lower-inner quadrant of right female breast: Secondary | ICD-10-CM

## 2012-08-10 NOTE — Progress Notes (Signed)
Weekly Management Note Current Dose:  45 Gy  Projected Dose: 60.4 Gy   Narrative:  The patient presents for routine under treatment assessment.  CBCT/MVCT images/Port film x-rays were reviewed.  The chart was checked. Saw on tx machine for electron set up. Skin is slightly dark. No erythema. Good energy.   Physical Findings: Weight:  . Unchanged  Impression:  The patient is tolerating radiation.  Plan:  Continue treatment as planned. Continue radiaplex.

## 2012-08-11 ENCOUNTER — Ambulatory Visit
Admission: RE | Admit: 2012-08-11 | Discharge: 2012-08-11 | Disposition: A | Discharge status: Home / Self Care | Payer: Medicare Other | Source: Ambulatory Visit | Attending: Radiation Oncology | Admitting: Radiation Oncology

## 2012-08-12 ENCOUNTER — Ambulatory Visit
Admission: RE | Admit: 2012-08-12 | Discharge: 2012-08-12 | Disposition: A | Discharge status: Home / Self Care | Payer: Medicare Other | Source: Ambulatory Visit | Attending: Radiation Oncology | Admitting: Radiation Oncology

## 2012-08-13 ENCOUNTER — Ambulatory Visit
Admission: RE | Admit: 2012-08-13 | Discharge: 2012-08-13 | Disposition: A | Discharge status: Home / Self Care | Payer: Medicare Other | Source: Ambulatory Visit | Attending: Radiation Oncology | Admitting: Radiation Oncology

## 2012-08-16 ENCOUNTER — Ambulatory Visit
Admission: RE | Admit: 2012-08-16 | Discharge: 2012-08-16 | Disposition: A | Discharge status: Home / Self Care | Payer: Medicare Other | Source: Ambulatory Visit | Attending: Radiation Oncology | Admitting: Radiation Oncology

## 2012-08-17 ENCOUNTER — Ambulatory Visit
Admission: RE | Admit: 2012-08-17 | Discharge: 2012-08-17 | Disposition: A | Discharge status: Home / Self Care | Payer: Medicare Other | Source: Ambulatory Visit | Attending: Radiation Oncology | Admitting: Radiation Oncology

## 2012-08-17 ENCOUNTER — Ambulatory Visit: Admission: RE | Admit: 2012-08-17 | Payer: Medicare Other | Source: Ambulatory Visit | Admitting: Radiation Oncology

## 2012-08-17 ENCOUNTER — Encounter: Payer: Self-pay | Admitting: Radiation Oncology

## 2012-08-17 VITALS — BP 120/76 | HR 81 | Temp 98.0°F | Resp 20 | Wt 150.0 lb

## 2012-08-17 DIAGNOSIS — C50311 Malignant neoplasm of lower-inner quadrant of right female breast: Secondary | ICD-10-CM

## 2012-08-17 NOTE — Progress Notes (Signed)
Weekly rad txs rt cw 26 completed so far, tanning, on cw and started on suubclavian, using radiaplex gel, some itching c/o, hast started tamoxifen 08/05/12 when Dr.Magrinat wrote rx, will ask MD if she should wait till radiation completed, having hot flashes stated 9:20 AM

## 2012-08-17 NOTE — Progress Notes (Signed)
[facility]    Radiation Oncology [address] [place]     Maryln Gottron, M.D. [place], [place] [address]               Billie Lade, M.D., Ph.D. Phone: [phone]      Molli Hazard A. Kathrynn Running, M.D. Fax: [phone]      Radene Gunning, M.D., Ph.D.         Lurline Hare, M.D.         Grayland Jack, M.D Weekly Treatment Management Note  Name: Alison Gross     MRN: [id number]        CSN: [id number] Date: 08/17/2012      DOB: 1932-03-18  CC: Gwynneth Aliment, MD         Allyne Gee    Status: Outpatient  Diagnosis: The encounter diagnosis was Cancer of lower-inner quadrant of female breast, right.  Current Dose: 46.8 Gy  Current Fraction: 26  Planned Dose: 60.4 Gy  Narrative: Alison Gross was seen today for weekly treatment management. The chart was checked and port films  were reviewed. She has noticed some itching along the right chest wall area. I recommended that she use hydrocortisone cream if this keeps her awake at night. She was given a prescription for tamoxifen and I have recommended she stop taking this until after she completes her radiation therapy.  Review of patient's allergies indicates no known allergies.  Current Outpatient Prescriptions  Medication Sig Dispense Refill  . carvedilol (COREG) 6.25 MG tablet Take 6.25 mg by mouth 2 (two) times daily with a meal.      . Cholecalciferol (VITAMIN D) 2000 UNITS CAPS Take 2,000 Units by mouth daily.       . hyaluronate sodium (RADIAPLEXRX) GEL Apply 1 application topically 2 (two) times daily. Apply to left  Breast after rad and bedtime,not 4 hours prior to rad tx      . Iron-FA-B Cmp-C-Biot-Probiotic (FUSION PLUS) CAPS Take 1 tablet by mouth daily.      Marland Kitchen linagliptin (TRADJENTA) 5 MG TABS tablet Take 5 mg by mouth daily.      Marland Kitchen losartan-hydrochlorothiazide (HYZAAR) 100-12.5 MG per tablet Take 1 tablet by mouth daily.      . Multiple Vitamin (MULTIVITAMIN WITH MINERALS) TABS Take 1 tablet by mouth daily.       . non-metallic deodorant Thornton Papas) MISC Apply 1 application topically daily.      . pantoprazole (PROTONIX) 40 MG tablet Take 40 mg by mouth daily.      . rosuvastatin (CRESTOR) 20 MG tablet Take 20 mg by mouth every evening.      . tamoxifen (NOLVADEX) 20 MG tablet Take 1 tablet (20 mg total) by mouth daily.  90 tablet  12   No current facility-administered medications for this encounter.   Labs:  Lab Results  Component Value Date   WBC 4.2 08/05/2012   HGB 10.8* 08/05/2012   HCT 33.8* 08/05/2012   MCV 87.6 08/05/2012   PLT 196 08/05/2012   Lab Results  Component Value Date   CREATININE 1.2* 08/05/2012   BUN 20.6 08/05/2012   NA 144 08/05/2012   K 4.0 08/05/2012   CL 107 08/05/2012   CO2 30* 08/05/2012   Lab Results  Component Value Date   ALT 21 08/05/2012   AST 24 08/05/2012   BILITOT 0.62 08/05/2012    Physical Examination:  weight is 150 lb (68.04 kg). Her oral temperature is 98 F (36.7 C). Her  blood pressure is 120/76 and her pulse is 81. Her respiration is 20.    Wt Readings from Last 3 Encounters:  08/17/12 150 lb (68.04 kg)  08/05/12 152 lb 14.4 oz (69.355 kg)  07/30/12 152 lb 12.8 oz (69.31 kg)    The right chest wall area shows significant hyperpigmentation changes but no skin breakdown is appreciated. Lungs - Normal respiratory effort, chest expands symmetrically. Lungs are clear to auscultation, no crackles or wheezes.  Heart has regular rhythm and rate  Abdomen is soft and non tender with normal bowel sounds  Assessment:  Patient tolerating treatments well except for issues as above  Plan: Continue treatment per original radiation prescription

## 2012-08-18 ENCOUNTER — Ambulatory Visit: Admission: RE | Admit: 2012-08-18 | Payer: Medicare Other | Source: Ambulatory Visit | Admitting: Radiation Oncology

## 2012-08-18 ENCOUNTER — Ambulatory Visit
Admission: RE | Admit: 2012-08-18 | Discharge: 2012-08-18 | Disposition: A | Discharge status: Home / Self Care | Payer: Medicare Other | Source: Ambulatory Visit | Attending: Radiation Oncology | Admitting: Radiation Oncology

## 2012-08-19 ENCOUNTER — Ambulatory Visit
Admission: RE | Admit: 2012-08-19 | Discharge: 2012-08-19 | Disposition: A | Discharge status: Home / Self Care | Payer: Medicare Other | Source: Ambulatory Visit | Attending: Radiation Oncology | Admitting: Radiation Oncology

## 2012-08-20 ENCOUNTER — Ambulatory Visit
Admission: RE | Admit: 2012-08-20 | Discharge: 2012-08-20 | Disposition: A | Discharge status: Home / Self Care | Payer: Medicare Other | Source: Ambulatory Visit | Attending: Radiation Oncology | Admitting: Radiation Oncology

## 2012-08-23 ENCOUNTER — Ambulatory Visit
Admission: RE | Admit: 2012-08-23 | Discharge: 2012-08-23 | Disposition: A | Discharge status: Home / Self Care | Payer: Medicare Other | Source: Ambulatory Visit | Attending: Radiation Oncology | Admitting: Radiation Oncology

## 2012-08-24 ENCOUNTER — Ambulatory Visit
Admission: RE | Admit: 2012-08-24 | Discharge: 2012-08-24 | Disposition: A | Discharge status: Home / Self Care | Payer: Medicare Other | Source: Ambulatory Visit | Attending: Radiation Oncology | Admitting: Radiation Oncology

## 2012-08-24 ENCOUNTER — Encounter: Payer: Self-pay | Admitting: Radiation Oncology

## 2012-08-24 VITALS — BP 137/61 | HR 64 | Temp 98.2°F | Resp 18 | Wt 150.7 lb

## 2012-08-24 DIAGNOSIS — C50311 Malignant neoplasm of lower-inner quadrant of right female breast: Secondary | ICD-10-CM

## 2012-08-24 MED ORDER — RADIAPLEXRX EX GEL
Freq: Once | CUTANEOUS | Status: AC
  Administered 2012-08-24: 10:00:00 via TOPICAL

## 2012-08-24 NOTE — Progress Notes (Signed)
Weekly rad txs right breast chest wall 31/33 completed, on her boost, hyperpigmentation ,dry skin, skin intact, uses radiaplex gel bid, no c/o pain, just tightness in her arm when first waking up, discussed using vitamin e lotion in a couple weeks ,gave 2nd tube radiaplex gel , almost out stated patient, will have Leanord Hawking -painter send  Or call patient about [facility] next support group, patient says she might be interested, 1 month f/u appt card given 9:08 AM

## 2012-08-24 NOTE — Progress Notes (Signed)
Weekly Management Note Current Dose: 56.4  Gy  Projected Dose: 60.4 Gy   Narrative:  The patient presents for routine under treatment assessment.  CBCT/MVCT images/Port film x-rays were reviewed.  The chart was checked. Doing well. Minimal pain.   Physical Findings: Weight: 150 lb 11.2 oz (68.357 kg). Dry desquamation over right chestwall and small area over right supraclavicular region.   Impression:  The patient is tolerating radiation.  Plan:  Continue treatment as planned. Continue radiaplex. Lotion with vit e x 2 weeks. F/u in 1 month. Refer to fynn.

## 2012-08-25 ENCOUNTER — Ambulatory Visit
Admission: RE | Admit: 2012-08-25 | Discharge: 2012-08-25 | Disposition: A | Discharge status: Home / Self Care | Payer: Medicare Other | Source: Ambulatory Visit | Attending: Radiation Oncology | Admitting: Radiation Oncology

## 2012-08-26 ENCOUNTER — Encounter: Payer: Self-pay | Admitting: Radiation Oncology

## 2012-08-26 ENCOUNTER — Ambulatory Visit
Admission: RE | Admit: 2012-08-26 | Discharge: 2012-08-26 | Disposition: A | Discharge status: Home / Self Care | Payer: Medicare Other | Source: Ambulatory Visit | Attending: Radiation Oncology | Admitting: Radiation Oncology

## 2012-08-27 NOTE — Progress Notes (Signed)
  Radiation Oncology         [phone] ________________________________  Name: Alison Gross MRN: [id number]  Date: 08/26/2012  DOB: [birth year]  End of Treatment Note  Diagnosis:  T2N1 right breast cancer (s/p mastectomy)    Indication for treatment:  Curative      Radiation treatment dates:   07/12/2012-08/26/2012  Site/dose:    Right chest wall / 50.4 Gray @ 1.8 Wallace Cullens per fraction x 28 fractions Right supraclavicular fossa / 45 Gy @1 .8 Gy per fraction x 25 fractions Right scar t boost / 10 Gray at [employer] per fraction x 5 fractions  Beams/energy:  Opposed Tangents / 6 MV photons LAO / 6 MV photons En face / 9 MeV electrons  Narrative: The patient tolerated radiation treatment relatively well.   She had minimal dry desquamation.  Plan: The patient has completed radiation treatment. The patient will return to radiation oncology clinic for routine followup in one month. I advised them to call or return sooner if they have any questions or concerns related to their recovery or treatment.  ------------------------------------------------  Lurline Hare, MD

## 2012-09-22 ENCOUNTER — Encounter: Payer: Self-pay | Admitting: Radiation Oncology

## 2012-09-23 ENCOUNTER — Ambulatory Visit
Admission: RE | Admit: 2012-09-23 | Discharge: 2012-09-23 | Disposition: A | Discharge status: Home / Self Care | Payer: Medicare Other | Source: Ambulatory Visit | Attending: Radiation Oncology | Admitting: Radiation Oncology

## 2012-09-23 ENCOUNTER — Encounter: Payer: Self-pay | Admitting: Radiation Oncology

## 2012-09-23 VITALS — BP 110/64 | HR 82 | Temp 98.2°F | Resp 20 | Wt 149.5 lb

## 2012-09-23 DIAGNOSIS — C50311 Malignant neoplasm of lower-inner quadrant of right female breast: Secondary | ICD-10-CM

## 2012-09-23 HISTORY — DX: Personal history of irradiation: Z92.3

## 2012-09-23 NOTE — Progress Notes (Signed)
Follow up s/p rad tx rcw, 07/12/12-08/26/12, well healed, tanning at different areas of skin, intact, tamoxifen 20mg  daily, havind mild hot flashes, appetite, good, energy level good, 3:19 PM

## 2012-09-23 NOTE — Progress Notes (Signed)
   Department of Radiation Oncology  Phone:  [phone] Fax:        [phone]   Name: Alison Gross MRN: [id number]  DOB: July 02, 1932  Date: 09/23/2012  Follow Up Visit Note  Diagnosis: T2 N1 right breast cancer  Summary and Interval since last radiation: 60.4 gray completed 08/26/2012  Interval History: Alison Gross presents today for routine followup.  She is doing well. Her skin is still dark. She still using Radiaplex. Her activity levels are improving. She has mild hot flashes with her tamoxifen but is feeling fine. She has an appointment with Dr. Daneil Dolin not next week. She has really no complaints today.  Allergies: No Known Allergies  Medications:  Current Outpatient Prescriptions  Medication Sig Dispense Refill  . carvedilol (COREG) 6.25 MG tablet Take 6.25 mg by mouth 2 (two) times daily with a meal.      . Cholecalciferol (VITAMIN D) 2000 UNITS CAPS Take 2,000 Units by mouth daily.       . hyaluronate sodium (RADIAPLEXRX) GEL Apply 1 application topically 2 (two) times daily. Apply to left  Breast after rad and bedtime,not 4 hours prior to rad tx      . Iron-FA-B Cmp-C-Biot-Probiotic (FUSION PLUS) CAPS Take 1 tablet by mouth daily.      Marland Kitchen linagliptin (TRADJENTA) 5 MG TABS tablet Take 5 mg by mouth daily.      Marland Kitchen losartan-hydrochlorothiazide (HYZAAR) 100-12.5 MG per tablet Take 1 tablet by mouth daily.      . Multiple Vitamin (MULTIVITAMIN WITH MINERALS) TABS Take 1 tablet by mouth daily.      . non-metallic deodorant Thornton Papas) MISC Apply 1 application topically daily.      . pantoprazole (PROTONIX) 40 MG tablet Take 40 mg by mouth daily.      . rosuvastatin (CRESTOR) 20 MG tablet Take 20 mg by mouth every evening.      . tamoxifen (NOLVADEX) 20 MG tablet Take 20 mg by mouth daily.       No current facility-administered medications for this encounter.    Physical Exam:  Filed Vitals:   09/23/12 1516  BP: 110/64  Pulse: 82  Temp: 98.2 F (36.8 C)  Resp: 20   she has  hyperpigmentation over her lateral right chest wall. This is worse around her scar and inferiorly. No palpable or visible signs of tumor recurrence.  IMPRESSION: Alison Gross is a 79 y.o. female with resolving acute effects of treatment  PLAN:  I've encouraged her to use lotion with vitamin E on this area of scarring. She has followup scheduled with medical oncology. I released her from followup with me. I be happy to see her back on an as-needed basis. We discussed some protection in the treated area. I encouraged her to call me with any questions.    Lurline Hare, MD

## 2012-10-03 ENCOUNTER — Other Ambulatory Visit (HOSPITAL_COMMUNITY): Payer: Self-pay | Admitting: Oncology

## 2012-10-03 DIAGNOSIS — D472 Monoclonal gammopathy: Secondary | ICD-10-CM

## 2012-10-05 ENCOUNTER — Other Ambulatory Visit (HOSPITAL_BASED_OUTPATIENT_CLINIC_OR_DEPARTMENT_OTHER): Payer: Medicare Other | Admitting: Lab

## 2012-10-05 ENCOUNTER — Encounter: Payer: Self-pay | Admitting: Physician Assistant

## 2012-10-05 ENCOUNTER — Ambulatory Visit (HOSPITAL_BASED_OUTPATIENT_CLINIC_OR_DEPARTMENT_OTHER): Payer: Medicare Other | Admitting: Physician Assistant

## 2012-10-05 VITALS — BP 125/73 | HR 69 | Temp 98.0°F | Resp 18 | Ht 64.0 in | Wt 149.3 lb

## 2012-10-05 DIAGNOSIS — N189 Chronic kidney disease, unspecified: Secondary | ICD-10-CM | POA: Insufficient documentation

## 2012-10-05 DIAGNOSIS — C50319 Malignant neoplasm of lower-inner quadrant of unspecified female breast: Secondary | ICD-10-CM

## 2012-10-05 DIAGNOSIS — C50919 Malignant neoplasm of unspecified site of unspecified female breast: Secondary | ICD-10-CM

## 2012-10-05 DIAGNOSIS — D472 Monoclonal gammopathy: Secondary | ICD-10-CM

## 2012-10-05 DIAGNOSIS — C50311 Malignant neoplasm of lower-inner quadrant of right female breast: Secondary | ICD-10-CM

## 2012-10-05 DIAGNOSIS — R799 Abnormal finding of blood chemistry, unspecified: Secondary | ICD-10-CM

## 2012-10-05 DIAGNOSIS — R768 Other specified abnormal immunological findings in serum: Secondary | ICD-10-CM | POA: Insufficient documentation

## 2012-10-05 LAB — CBC WITH DIFFERENTIAL/PLATELET
BASO%: 0.6 % (ref 0.0–2.0)
Basophils Absolute: 0 10e3/uL (ref 0.0–0.1)
EOS%: 5.9 % (ref 0.0–7.0)
Eosinophils Absolute: 0.2 10e3/uL (ref 0.0–0.5)
HCT: 32.7 % — ABNORMAL LOW (ref 34.8–46.6)
HGB: 10.7 g/dL — ABNORMAL LOW (ref 11.6–15.9)
LYMPH%: 21.4 % (ref 14.0–49.7)
MCH: 28.2 pg (ref 25.1–34.0)
MCHC: 32.7 g/dL (ref 31.5–36.0)
MCV: 86.3 fL (ref 79.5–101.0)
MONO#: 0.3 10e3/uL (ref 0.1–0.9)
MONO%: 9.7 % (ref 0.0–14.0)
NEUT#: 2.1 10e3/uL (ref 1.5–6.5)
NEUT%: 62.4 % (ref 38.4–76.8)
Platelets: 189 10e3/uL (ref 145–400)
RBC: 3.79 10e6/uL (ref 3.70–5.45)
RDW: 14.5 % (ref 11.2–14.5)
WBC: 3.4 10e3/uL — ABNORMAL LOW (ref 3.9–10.3)
lymph#: 0.7 10e3/uL — ABNORMAL LOW (ref 0.9–3.3)
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
## 2012-10-07 ENCOUNTER — Other Ambulatory Visit: Payer: Self-pay | Admitting: Physician Assistant

## 2012-10-07 LAB — PROTEIN ELECTROPHORESIS, SERUM
Albumin ELP: 53.7 % — ABNORMAL LOW (ref 55.8–66.1)
Alpha-2-Globulin: 9.2 % (ref 7.1–11.8)
Beta Globulin: 4.7 % (ref 4.7–7.2)
Total Protein, Serum Electrophoresis: 6.8 g/dL (ref 6.0–8.3)

## 2012-10-11 ENCOUNTER — Telehealth: Payer: Self-pay | Admitting: Oncology

## 2012-10-13 ENCOUNTER — Other Ambulatory Visit: Payer: Self-pay

## 2012-10-14 ENCOUNTER — Other Ambulatory Visit: Payer: Self-pay | Admitting: Oncology

## 2012-10-27 ENCOUNTER — Ambulatory Visit (INDEPENDENT_AMBULATORY_CARE_PROVIDER_SITE_OTHER): Payer: Medicare Other | Admitting: Surgery

## 2012-10-27 ENCOUNTER — Encounter (INDEPENDENT_AMBULATORY_CARE_PROVIDER_SITE_OTHER): Payer: Self-pay | Admitting: Surgery

## 2012-10-27 VITALS — BP 140/62 | HR 60 | Ht 65.0 in | Wt 149.4 lb

## 2012-10-27 DIAGNOSIS — Z853 Personal history of malignant neoplasm of breast: Secondary | ICD-10-CM

## 2012-10-27 NOTE — Progress Notes (Signed)
[name]       DOB: 04-Mar-1933           DATE: 10/27/2012       JYN:[id number]  CC:  Chief Complaint  Patient presents with  . Breast Cancer Long Term Follow Up    HPI: 2 status post bilateral mastectomies and comes in for followup after her radiation has completed. She feels that she did well with radiation is not having any problems. EXAM: Vital signs: BP 140/62  Pulse 60  Ht 5\' 5"  (1.651 m)  Wt 149 lb 6.4 oz (67.767 kg)  BMI 24.86 kg/m2  General: Patient alert, oriented, NAD  Breasts: Both incisions are healing nicely. Mild increased pigmentation on the right side in the tissues or a little bit "stiff "compared to the left. I think these are just normal radiation changes. IMP: doing well  PLAN: she'll come back in March which will be a 1 year anniversary from her surgery.    Karina Lenderman J 10/27/2012

## 2012-10-27 NOTE — Patient Instructions (Addendum)
See [place] again in March of next year

## 2012-11-03 ENCOUNTER — Ambulatory Visit (HOSPITAL_COMMUNITY): Payer: Medicare Other

## 2012-11-03 ENCOUNTER — Other Ambulatory Visit (HOSPITAL_BASED_OUTPATIENT_CLINIC_OR_DEPARTMENT_OTHER): Payer: Medicare Other | Admitting: Lab

## 2012-11-03 DIAGNOSIS — C50319 Malignant neoplasm of lower-inner quadrant of unspecified female breast: Secondary | ICD-10-CM

## 2012-11-03 DIAGNOSIS — R799 Abnormal finding of blood chemistry, unspecified: Secondary | ICD-10-CM

## 2012-11-03 DIAGNOSIS — C50311 Malignant neoplasm of lower-inner quadrant of right female breast: Secondary | ICD-10-CM

## 2012-11-03 DIAGNOSIS — R768 Other specified abnormal immunological findings in serum: Secondary | ICD-10-CM

## 2012-11-03 LAB — COMPREHENSIVE METABOLIC PANEL (CC13)
Albumin: 3.2 g/dL — ABNORMAL LOW (ref 3.5–5.0)
Alkaline Phosphatase: 54 U/L (ref 40–150)
BUN: 20.1 mg/dL (ref 7.0–26.0)
CO2: 25 mEq/L (ref 22–29)
Calcium: 9.4 mg/dL (ref 8.4–10.4)
Glucose: 80 mg/dl (ref 70–140)
Potassium: 4.3 mEq/L (ref 3.5–5.1)
Total Protein: 7.1 g/dL (ref 6.4–8.3)

## 2012-11-03 LAB — CBC WITH DIFFERENTIAL/PLATELET
Basophils Absolute: 0 10*3/uL (ref 0.0–0.1)
Eosinophils Absolute: 0.2 10*3/uL (ref 0.0–0.5)
HGB: 10.3 g/dL — ABNORMAL LOW (ref 11.6–15.9)
MCV: 85.9 fL (ref 79.5–101.0)
MONO#: 0.3 10*3/uL (ref 0.1–0.9)
MONO%: 9.9 % (ref 0.0–14.0)
NEUT#: 2.1 10*3/uL (ref 1.5–6.5)
Platelets: 193 10*3/uL (ref 145–400)
RDW: 14.7 % — ABNORMAL HIGH (ref 11.2–14.5)
WBC: 3.4 10*3/uL — ABNORMAL LOW (ref 3.9–10.3)

## 2012-11-05 LAB — KAPPA/LAMBDA LIGHT CHAINS
Kappa free light chain: 1.89 mg/dL (ref 0.33–1.94)
Kappa:Lambda Ratio: 0.09 — ABNORMAL LOW (ref 0.26–1.65)
Lambda Free Lght Chn: 21.6 mg/dL — ABNORMAL HIGH (ref 0.57–2.63)

## 2012-11-05 LAB — PROTEIN ELECTROPHORESIS, SERUM
Albumin ELP: 54.5 % — ABNORMAL LOW (ref 55.8–66.1)
Alpha-1-Globulin: 5.4 % — ABNORMAL HIGH (ref 2.9–4.9)
Beta 2: 3.2 % (ref 3.2–6.5)
Total Protein, Serum Electrophoresis: 7 g/dL (ref 6.0–8.3)

## 2012-11-10 ENCOUNTER — Encounter: Payer: Self-pay | Admitting: Physician Assistant

## 2012-11-10 ENCOUNTER — Telehealth: Payer: Self-pay | Admitting: Oncology

## 2012-11-10 ENCOUNTER — Ambulatory Visit (HOSPITAL_BASED_OUTPATIENT_CLINIC_OR_DEPARTMENT_OTHER): Payer: Medicare Other | Admitting: Physician Assistant

## 2012-11-10 VITALS — BP 125/69 | HR 76 | Temp 98.2°F | Resp 18 | Ht 65.0 in | Wt 147.3 lb

## 2012-11-10 DIAGNOSIS — M899 Disorder of bone, unspecified: Secondary | ICD-10-CM

## 2012-11-10 DIAGNOSIS — Z170 Estrogen receptor positive status [ER+]: Secondary | ICD-10-CM

## 2012-11-10 DIAGNOSIS — C50319 Malignant neoplasm of lower-inner quadrant of unspecified female breast: Secondary | ICD-10-CM

## 2012-11-10 DIAGNOSIS — R768 Other specified abnormal immunological findings in serum: Secondary | ICD-10-CM

## 2012-11-10 DIAGNOSIS — D649 Anemia, unspecified: Secondary | ICD-10-CM

## 2012-11-10 DIAGNOSIS — C50911 Malignant neoplasm of unspecified site of right female breast: Secondary | ICD-10-CM

## 2012-11-10 DIAGNOSIS — C50312 Malignant neoplasm of lower-inner quadrant of left female breast: Secondary | ICD-10-CM
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
## 2012-12-06 ENCOUNTER — Ambulatory Visit (HOSPITAL_COMMUNITY)
Admission: RE | Admit: 2012-12-06 | Discharge: 2012-12-06 | Disposition: A | Discharge status: Home / Self Care | Payer: Medicare Other | Source: Ambulatory Visit | Attending: Physician Assistant | Admitting: Physician Assistant

## 2012-12-06 DIAGNOSIS — C50919 Malignant neoplasm of unspecified site of unspecified female breast: Secondary | ICD-10-CM | POA: Insufficient documentation

## 2012-12-06 DIAGNOSIS — J90 Pleural effusion, not elsewhere classified: Secondary | ICD-10-CM | POA: Insufficient documentation

## 2012-12-06 DIAGNOSIS — C50311 Malignant neoplasm of lower-inner quadrant of right female breast: Secondary | ICD-10-CM

## 2012-12-06 DIAGNOSIS — E049 Nontoxic goiter, unspecified: Secondary | ICD-10-CM | POA: Insufficient documentation

## 2012-12-06 DIAGNOSIS — R768 Other specified abnormal immunological findings in serum: Secondary | ICD-10-CM

## 2012-12-17 ENCOUNTER — Other Ambulatory Visit: Payer: Self-pay | Admitting: Oncology

## 2012-12-17 DIAGNOSIS — C50919 Malignant neoplasm of unspecified site of unspecified female breast: Secondary | ICD-10-CM

## 2012-12-17 NOTE — Progress Notes (Signed)
I called Alison Gross and that as far as myeloma is concerned her bone survey is very reassuring. However there is a note regarding "interval increase in right pleural effusion". She was not aware of that she had a pleural effusion on the right and I do not find mention of it in prior records.  I discussed this with her and she is agreeable to proceeding to a CT scan of the chest for further evaluation. This should be performed next week. She will call for results.

## 2012-12-20 ENCOUNTER — Telehealth: Payer: Self-pay | Admitting: Oncology

## 2012-12-20 NOTE — Telephone Encounter (Signed)
Received call from central and pt needs lb prior to 10/16 ct. Added lb and s/w pt re lb appt for 10/16 @ 1:30pm. Per central messages have also been left w/desk nurse re pt needing lbs before ct.

## 2012-12-23 ENCOUNTER — Other Ambulatory Visit (HOSPITAL_BASED_OUTPATIENT_CLINIC_OR_DEPARTMENT_OTHER): Payer: Medicare Other | Admitting: Lab

## 2012-12-23 ENCOUNTER — Encounter (HOSPITAL_COMMUNITY): Payer: Self-pay

## 2012-12-23 ENCOUNTER — Ambulatory Visit (HOSPITAL_COMMUNITY)
Admission: RE | Admit: 2012-12-23 | Discharge: 2012-12-23 | Disposition: A | Discharge status: Home / Self Care | Payer: Medicare Other | Source: Ambulatory Visit | Attending: Oncology | Admitting: Oncology

## 2012-12-23 DIAGNOSIS — Z9010 Acquired absence of unspecified breast and nipple: Secondary | ICD-10-CM | POA: Insufficient documentation

## 2012-12-23 DIAGNOSIS — C50312 Malignant neoplasm of lower-inner quadrant of left female breast: Secondary | ICD-10-CM

## 2012-12-23 DIAGNOSIS — C50319 Malignant neoplasm of lower-inner quadrant of unspecified female breast: Secondary | ICD-10-CM

## 2012-12-23 DIAGNOSIS — D649 Anemia, unspecified: Secondary | ICD-10-CM

## 2012-12-23 DIAGNOSIS — M47814 Spondylosis without myelopathy or radiculopathy, thoracic region: Secondary | ICD-10-CM | POA: Insufficient documentation

## 2012-12-23 DIAGNOSIS — J9819 Other pulmonary collapse: Secondary | ICD-10-CM | POA: Insufficient documentation

## 2012-12-23 DIAGNOSIS — E049 Nontoxic goiter, unspecified: Secondary | ICD-10-CM

## 2012-12-23 DIAGNOSIS — R768 Other specified abnormal immunological findings in serum: Secondary | ICD-10-CM

## 2012-12-23 DIAGNOSIS — C50919 Malignant neoplasm of unspecified site of unspecified female breast: Secondary | ICD-10-CM

## 2012-12-23 DIAGNOSIS — E042 Nontoxic multinodular goiter: Secondary | ICD-10-CM | POA: Insufficient documentation

## 2012-12-23 DIAGNOSIS — J90 Pleural effusion, not elsewhere classified: Secondary | ICD-10-CM

## 2012-12-23 DIAGNOSIS — C50911 Malignant neoplasm of unspecified site of right female breast: Secondary | ICD-10-CM

## 2012-12-23 HISTORY — DX: Pleural effusion, not elsewhere classified: J90

## 2012-12-23 HISTORY — DX: Nontoxic goiter, unspecified: E04.9

## 2012-12-23 LAB — COMPREHENSIVE METABOLIC PANEL (CC13)
ALT: 8 U/L (ref 0–55)
AST: 17 U/L (ref 5–34)
Anion Gap: 5 mEq/L (ref 3–11)
CO2: 28 mEq/L (ref 22–29)
Calcium: 9.4 mg/dL (ref 8.4–10.4)
Chloride: 109 mEq/L (ref 98–109)
Creatinine: 1.3 mg/dL — ABNORMAL HIGH (ref 0.6–1.1)
Sodium: 141 mEq/L (ref 136–145)
Total Protein: 7 g/dL (ref 6.4–8.3)

## 2012-12-23 LAB — CBC WITH DIFFERENTIAL/PLATELET
BASO%: 1.1 % (ref 0.0–2.0)
EOS%: 4.2 % (ref 0.0–7.0)
MCH: 28.6 pg (ref 25.1–34.0)
MCHC: 33 g/dL (ref 31.5–36.0)
MONO#: 0.5 10*3/uL (ref 0.1–0.9)
RBC: 3.49 10*6/uL — ABNORMAL LOW (ref 3.70–5.45)
RDW: 15.3 % — ABNORMAL HIGH (ref 11.2–14.5)
WBC: 3.5 10*3/uL — ABNORMAL LOW (ref 3.9–10.3)
lymph#: 0.6 10*3/uL — ABNORMAL LOW (ref 0.9–3.3)

## 2012-12-23 MED ORDER — IOHEXOL 300 MG/ML  SOLN
80.0000 mL | Freq: Once | INTRAMUSCULAR | Status: AC | PRN
  Administered 2012-12-23: 80 mL via INTRAVENOUS

## 2012-12-27 LAB — PROTEIN ELECTROPHORESIS, SERUM
Alpha-1-Globulin: 3.3 % (ref 2.9–4.9)
Alpha-2-Globulin: 11.3 % (ref 7.1–11.8)
Beta 2: 3.3 % (ref 3.2–6.5)
Gamma Globulin: 22.7 % — ABNORMAL HIGH (ref 11.1–18.8)
M-Spike, %: 1.02 g/dL

## 2012-12-27 LAB — KAPPA/LAMBDA LIGHT CHAINS: Kappa free light chain: 2.08 mg/dL — ABNORMAL HIGH (ref 0.33–1.94)

## 2012-12-28 ENCOUNTER — Other Ambulatory Visit: Payer: Self-pay | Admitting: Oncology

## 2013-01-08 ENCOUNTER — Other Ambulatory Visit: Payer: Self-pay | Admitting: Oncology

## 2013-01-13 ENCOUNTER — Other Ambulatory Visit: Payer: Self-pay | Admitting: Oncology

## 2013-01-13 ENCOUNTER — Other Ambulatory Visit: Payer: Self-pay

## 2013-01-13 DIAGNOSIS — C50919 Malignant neoplasm of unspecified site of unspecified female breast: Secondary | ICD-10-CM

## 2013-01-21 ENCOUNTER — Ambulatory Visit (INDEPENDENT_AMBULATORY_CARE_PROVIDER_SITE_OTHER): Payer: BC Managed Care – HMO

## 2013-01-21 DIAGNOSIS — Z23 Encounter for immunization: Secondary | ICD-10-CM

## 2013-02-08 ENCOUNTER — Other Ambulatory Visit (HOSPITAL_BASED_OUTPATIENT_CLINIC_OR_DEPARTMENT_OTHER): Payer: Medicare Other | Admitting: Lab

## 2013-02-08 ENCOUNTER — Other Ambulatory Visit: Payer: Self-pay | Admitting: *Deleted

## 2013-02-08 ENCOUNTER — Ambulatory Visit (HOSPITAL_COMMUNITY)
Admission: RE | Admit: 2013-02-08 | Discharge: 2013-02-08 | Disposition: A | Discharge status: Home / Self Care | Payer: Medicare Other | Source: Ambulatory Visit | Attending: Oncology | Admitting: Oncology

## 2013-02-08 DIAGNOSIS — C50919 Malignant neoplasm of unspecified site of unspecified female breast: Secondary | ICD-10-CM | POA: Insufficient documentation

## 2013-02-08 DIAGNOSIS — Z9010 Acquired absence of unspecified breast and nipple: Secondary | ICD-10-CM | POA: Insufficient documentation

## 2013-02-08 DIAGNOSIS — C50319 Malignant neoplasm of lower-inner quadrant of unspecified female breast: Secondary | ICD-10-CM

## 2013-02-08 DIAGNOSIS — J90 Pleural effusion, not elsewhere classified: Secondary | ICD-10-CM | POA: Insufficient documentation

## 2013-02-08 DIAGNOSIS — E049 Nontoxic goiter, unspecified: Secondary | ICD-10-CM | POA: Insufficient documentation

## 2013-02-08 LAB — CBC WITH DIFFERENTIAL/PLATELET
BASO%: 1.1 % (ref 0.0–2.0)
LYMPH%: 14.2 % (ref 14.0–49.7)
MCH: 28.7 pg (ref 25.1–34.0)
MCHC: 32.1 g/dL (ref 31.5–36.0)
MONO#: 0.4 10*3/uL (ref 0.1–0.9)
MONO%: 10.8 % (ref 0.0–14.0)
NEUT#: 2.7 10*3/uL (ref 1.5–6.5)
Platelets: 186 10*3/uL (ref 145–400)
RBC: 3.54 10*6/uL — ABNORMAL LOW (ref 3.70–5.45)
RDW: 14.5 % (ref 11.2–14.5)
WBC: 3.8 10*3/uL — ABNORMAL LOW (ref 3.9–10.3)

## 2013-02-08 LAB — COMPREHENSIVE METABOLIC PANEL (CC13)
ALT: 10 U/L (ref 0–55)
Albumin: 3.1 g/dL — ABNORMAL LOW (ref 3.5–5.0)
Alkaline Phosphatase: 56 U/L (ref 40–150)
Anion Gap: 10 mEq/L (ref 3–11)
CO2: 24 mEq/L (ref 22–29)
Creatinine: 1.3 mg/dL — ABNORMAL HIGH (ref 0.6–1.1)
Potassium: 4.6 mEq/L (ref 3.5–5.1)
Sodium: 143 mEq/L (ref 136–145)
Total Bilirubin: 0.48 mg/dL (ref 0.20–1.20)
Total Protein: 6.7 g/dL (ref 6.4–8.3)

## 2013-02-15 ENCOUNTER — Ambulatory Visit (HOSPITAL_BASED_OUTPATIENT_CLINIC_OR_DEPARTMENT_OTHER): Payer: Medicare Other | Admitting: Oncology

## 2013-02-15 ENCOUNTER — Telehealth: Payer: Self-pay | Admitting: *Deleted

## 2013-02-15 VITALS — BP 137/72 | HR 83 | Temp 98.2°F | Resp 18 | Ht 65.0 in | Wt 141.4 lb

## 2013-02-15 DIAGNOSIS — C50119 Malignant neoplasm of central portion of unspecified female breast: Secondary | ICD-10-CM

## 2013-02-15 DIAGNOSIS — Z9010 Acquired absence of unspecified breast and nipple: Secondary | ICD-10-CM

## 2013-02-15 DIAGNOSIS — C50319 Malignant neoplasm of lower-inner quadrant of unspecified female breast: Secondary | ICD-10-CM

## 2013-02-15 DIAGNOSIS — Z170 Estrogen receptor positive status [ER+]: Secondary | ICD-10-CM

## 2013-02-15 NOTE — Addendum Note (Signed)
Addended by: Billey Co on: 02/15/2013 06:48 PM   Modules accepted: Orders

## 2013-02-15 NOTE — Telephone Encounter (Signed)
appts made and printed. Pt is aware that cs will call w/ appt for CT CHEST...td
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
## 2013-04-14 ENCOUNTER — Ambulatory Visit (HOSPITAL_COMMUNITY)
Admission: RE | Admit: 2013-04-14 | Discharge: 2013-04-14 | Disposition: A | Discharge status: Home / Self Care | Payer: Medicare HMO | Source: Ambulatory Visit | Attending: Oncology | Admitting: Oncology

## 2013-04-14 ENCOUNTER — Encounter (HOSPITAL_COMMUNITY): Payer: Self-pay

## 2013-04-14 ENCOUNTER — Other Ambulatory Visit (HOSPITAL_BASED_OUTPATIENT_CLINIC_OR_DEPARTMENT_OTHER): Payer: Medicare HMO

## 2013-04-14 DIAGNOSIS — I2510 Atherosclerotic heart disease of native coronary artery without angina pectoris: Secondary | ICD-10-CM | POA: Insufficient documentation

## 2013-04-14 DIAGNOSIS — Z923 Personal history of irradiation: Secondary | ICD-10-CM | POA: Insufficient documentation

## 2013-04-14 DIAGNOSIS — I517 Cardiomegaly: Secondary | ICD-10-CM | POA: Insufficient documentation

## 2013-04-14 DIAGNOSIS — N269 Renal sclerosis, unspecified: Secondary | ICD-10-CM | POA: Insufficient documentation

## 2013-04-14 DIAGNOSIS — C50319 Malignant neoplasm of lower-inner quadrant of unspecified female breast: Secondary | ICD-10-CM

## 2013-04-14 DIAGNOSIS — R634 Abnormal weight loss: Secondary | ICD-10-CM | POA: Insufficient documentation

## 2013-04-14 DIAGNOSIS — C50919 Malignant neoplasm of unspecified site of unspecified female breast: Secondary | ICD-10-CM | POA: Insufficient documentation

## 2013-04-14 DIAGNOSIS — C50119 Malignant neoplasm of central portion of unspecified female breast: Secondary | ICD-10-CM

## 2013-04-14 DIAGNOSIS — R059 Cough, unspecified: Secondary | ICD-10-CM | POA: Insufficient documentation

## 2013-04-14 DIAGNOSIS — J90 Pleural effusion, not elsewhere classified: Secondary | ICD-10-CM | POA: Insufficient documentation

## 2013-04-14 DIAGNOSIS — R911 Solitary pulmonary nodule: Secondary | ICD-10-CM | POA: Insufficient documentation

## 2013-04-14 DIAGNOSIS — R05 Cough: Secondary | ICD-10-CM | POA: Insufficient documentation

## 2013-04-14 DIAGNOSIS — E049 Nontoxic goiter, unspecified: Secondary | ICD-10-CM | POA: Insufficient documentation

## 2013-04-14 DIAGNOSIS — Z9010 Acquired absence of unspecified breast and nipple: Secondary | ICD-10-CM | POA: Insufficient documentation

## 2013-04-14 DIAGNOSIS — J988 Other specified respiratory disorders: Secondary | ICD-10-CM

## 2013-04-14 DIAGNOSIS — I700 Atherosclerosis of aorta: Secondary | ICD-10-CM | POA: Insufficient documentation

## 2013-04-14 DIAGNOSIS — J398 Other specified diseases of upper respiratory tract: Secondary | ICD-10-CM | POA: Insufficient documentation

## 2013-04-14 LAB — CBC WITH DIFFERENTIAL/PLATELET
BASO%: 1.2 % (ref 0.0–2.0)
BASOS ABS: 0 10*3/uL (ref 0.0–0.1)
EOS%: 5.6 % (ref 0.0–7.0)
Eosinophils Absolute: 0.2 10*3/uL (ref 0.0–0.5)
HCT: 31.3 % — ABNORMAL LOW (ref 34.8–46.6)
HEMOGLOBIN: 10.3 g/dL — AB (ref 11.6–15.9)
LYMPH#: 0.6 10*3/uL — AB (ref 0.9–3.3)
LYMPH%: 17 % (ref 14.0–49.7)
MCH: 28.9 pg (ref 25.1–34.0)
MCHC: 33 g/dL (ref 31.5–36.0)
MCV: 87.6 fL (ref 79.5–101.0)
MONO#: 0.4 10*3/uL (ref 0.1–0.9)
MONO%: 11.8 % (ref 0.0–14.0)
NEUT%: 64.4 % (ref 38.4–76.8)
NEUTROS ABS: 2.1 10*3/uL (ref 1.5–6.5)
Platelets: 176 10*3/uL (ref 145–400)
RBC: 3.57 10*6/uL — AB (ref 3.70–5.45)
RDW: 14.6 % — ABNORMAL HIGH (ref 11.2–14.5)
WBC: 3.3 10*3/uL — AB (ref 3.9–10.3)

## 2013-04-14 LAB — COMPREHENSIVE METABOLIC PANEL (CC13)
ALK PHOS: 55 U/L (ref 40–150)
ALT: 11 U/L (ref 0–55)
AST: 16 U/L (ref 5–34)
Albumin: 3.4 g/dL — ABNORMAL LOW (ref 3.5–5.0)
Anion Gap: 6 mEq/L (ref 3–11)
BUN: 24.7 mg/dL (ref 7.0–26.0)
CALCIUM: 9.2 mg/dL (ref 8.4–10.4)
CO2: 28 mEq/L (ref 22–29)
CREATININE: 1.3 mg/dL — AB (ref 0.6–1.1)
Chloride: 110 mEq/L — ABNORMAL HIGH (ref 98–109)
Glucose: 103 mg/dl (ref 70–140)
Potassium: 4.3 mEq/L (ref 3.5–5.1)
Sodium: 143 mEq/L (ref 136–145)
Total Bilirubin: 0.58 mg/dL (ref 0.20–1.20)
Total Protein: 7.1 g/dL (ref 6.4–8.3)

## 2013-04-18 LAB — PROTEIN ELECTROPHORESIS, SERUM
ALBUMIN ELP: 53.4 % — AB (ref 55.8–66.1)
ALPHA-1-GLOBULIN: 2.9 % (ref 2.9–4.9)
Alpha-2-Globulin: 11.6 % (ref 7.1–11.8)
BETA 2: 3.5 % (ref 3.2–6.5)
Beta Globulin: 4.6 % — ABNORMAL LOW (ref 4.7–7.2)
GAMMA GLOBULIN: 24 % — AB (ref 11.1–18.8)
M-Spike, %: 1.19 g/dL
Total Protein, Serum Electrophoresis: 6.9 g/dL (ref 6.0–8.3)

## 2013-04-18 LAB — KAPPA/LAMBDA LIGHT CHAINS
KAPPA LAMBDA RATIO: 0.08 — AB (ref 0.26–1.65)
Kappa free light chain: 1.96 mg/dL — ABNORMAL HIGH (ref 0.33–1.94)
Lambda Free Lght Chn: 24.7 mg/dL — ABNORMAL HIGH (ref 0.57–2.63)

## 2013-04-21 ENCOUNTER — Ambulatory Visit (HOSPITAL_BASED_OUTPATIENT_CLINIC_OR_DEPARTMENT_OTHER): Payer: Commercial Managed Care - HMO | Admitting: Oncology

## 2013-04-21 ENCOUNTER — Telehealth: Payer: Self-pay | Admitting: Oncology

## 2013-04-21 VITALS — BP 137/70 | HR 76 | Temp 98.1°F | Resp 18 | Ht 65.0 in | Wt 137.9 lb

## 2013-04-21 DIAGNOSIS — N189 Chronic kidney disease, unspecified: Secondary | ICD-10-CM

## 2013-04-21 DIAGNOSIS — C50919 Malignant neoplasm of unspecified site of unspecified female breast: Secondary | ICD-10-CM

## 2013-04-21 DIAGNOSIS — D472 Monoclonal gammopathy: Secondary | ICD-10-CM

## 2013-04-21 DIAGNOSIS — N183 Chronic kidney disease, stage 3 unspecified: Secondary | ICD-10-CM

## 2013-04-21 DIAGNOSIS — J90 Pleural effusion, not elsewhere classified: Secondary | ICD-10-CM

## 2013-04-21 DIAGNOSIS — C50319 Malignant neoplasm of lower-inner quadrant of unspecified female breast: Secondary | ICD-10-CM

## 2013-04-21 DIAGNOSIS — M899 Disorder of bone, unspecified: Secondary | ICD-10-CM

## 2013-04-21 DIAGNOSIS — E049 Nontoxic goiter, unspecified: Secondary | ICD-10-CM

## 2013-04-21 DIAGNOSIS — M949 Disorder of cartilage, unspecified: Secondary | ICD-10-CM

## 2013-04-21 HISTORY — DX: Monoclonal gammopathy: D47.2
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
## 2013-07-08 ENCOUNTER — Encounter (INDEPENDENT_AMBULATORY_CARE_PROVIDER_SITE_OTHER): Payer: Self-pay | Admitting: General Surgery

## 2013-07-08 ENCOUNTER — Ambulatory Visit (INDEPENDENT_AMBULATORY_CARE_PROVIDER_SITE_OTHER): Payer: Medicare HMO | Admitting: General Surgery

## 2013-07-08 VITALS — BP 122/70 | HR 74 | Temp 97.2°F | Ht 65.0 in | Wt 135.6 lb

## 2013-07-08 DIAGNOSIS — C50919 Malignant neoplasm of unspecified site of unspecified female breast: Secondary | ICD-10-CM

## 2013-07-08 NOTE — Progress Notes (Signed)
Procedure:  Right modified radical mastectomy. Left total mastectomy.  Date:  05/10/2012  Pathology: Right side T2N1, left side DCIS  Hx:  She is here for long-term followup visit. She denies any chest wall nodules or adenopathy. She is tolerating that tamoxifen. She is due to see Dr. Jana Hakim later this month.  PE: Alison Gross looks well and is in no acute distress  Breasts-Absent bilaterally. Chest wall scars are present. No chest wall nodules.  Lymph nodes-No palpable axillary, cervical, or supraclavicular adenopathy.  Assessment:  Bilateral breast cancers-no clinical evidence of recurrence. Tolerating tamoxifen.  Plan:  Return visit 3 months. Suspect we could alternate with Dr. Jana Hakim so she would be seen every 3 months for the next year.

## 2013-07-08 NOTE — Patient Instructions (Signed)
Call if you find any new lumps in your breasts or chest wall. 

## 2013-07-18 ENCOUNTER — Ambulatory Visit (HOSPITAL_COMMUNITY): Payer: Medicare HMO

## 2013-07-18 ENCOUNTER — Other Ambulatory Visit: Payer: Medicare HMO

## 2013-07-21 ENCOUNTER — Telehealth: Payer: Self-pay | Admitting: Physician Assistant

## 2013-07-21 ENCOUNTER — Encounter: Payer: Self-pay | Admitting: Physician Assistant

## 2013-07-21 ENCOUNTER — Ambulatory Visit (HOSPITAL_BASED_OUTPATIENT_CLINIC_OR_DEPARTMENT_OTHER): Payer: Commercial Managed Care - HMO

## 2013-07-21 ENCOUNTER — Other Ambulatory Visit: Payer: Self-pay | Admitting: Physician Assistant

## 2013-07-21 ENCOUNTER — Ambulatory Visit (HOSPITAL_COMMUNITY)
Admission: RE | Admit: 2013-07-21 | Discharge: 2013-07-21 | Disposition: A | Discharge status: Home / Self Care | Payer: Medicare HMO | Source: Ambulatory Visit | Attending: Physician Assistant | Admitting: Physician Assistant

## 2013-07-21 ENCOUNTER — Ambulatory Visit (HOSPITAL_BASED_OUTPATIENT_CLINIC_OR_DEPARTMENT_OTHER): Payer: Commercial Managed Care - HMO | Admitting: Physician Assistant

## 2013-07-21 VITALS — BP 151/76 | HR 86 | Temp 98.6°F | Resp 18 | Ht 65.0 in | Wt 132.5 lb

## 2013-07-21 DIAGNOSIS — C50319 Malignant neoplasm of lower-inner quadrant of unspecified female breast: Secondary | ICD-10-CM

## 2013-07-21 DIAGNOSIS — N189 Chronic kidney disease, unspecified: Secondary | ICD-10-CM

## 2013-07-21 DIAGNOSIS — C50919 Malignant neoplasm of unspecified site of unspecified female breast: Secondary | ICD-10-CM

## 2013-07-21 DIAGNOSIS — N183 Chronic kidney disease, stage 3 unspecified: Secondary | ICD-10-CM

## 2013-07-21 DIAGNOSIS — J9819 Other pulmonary collapse: Secondary | ICD-10-CM | POA: Insufficient documentation

## 2013-07-21 DIAGNOSIS — D472 Monoclonal gammopathy: Secondary | ICD-10-CM

## 2013-07-21 DIAGNOSIS — J90 Pleural effusion, not elsewhere classified: Secondary | ICD-10-CM | POA: Insufficient documentation

## 2013-07-21 DIAGNOSIS — R222 Localized swelling, mass and lump, trunk: Secondary | ICD-10-CM | POA: Insufficient documentation

## 2013-07-21 LAB — CBC WITH DIFFERENTIAL/PLATELET
BASO%: 1.2 % (ref 0.0–2.0)
BASOS ABS: 0.1 10*3/uL (ref 0.0–0.1)
EOS ABS: 0.5 10*3/uL (ref 0.0–0.5)
EOS%: 10.2 % — ABNORMAL HIGH (ref 0.0–7.0)
HEMATOCRIT: 32.8 % — AB (ref 34.8–46.6)
HEMOGLOBIN: 10.6 g/dL — AB (ref 11.6–15.9)
LYMPH#: 0.7 10*3/uL — AB (ref 0.9–3.3)
LYMPH%: 13.7 % — ABNORMAL LOW (ref 14.0–49.7)
MCH: 29 pg (ref 25.1–34.0)
MCHC: 32.3 g/dL (ref 31.5–36.0)
MCV: 89.6 fL (ref 79.5–101.0)
MONO#: 0.5 10*3/uL (ref 0.1–0.9)
MONO%: 9.7 % (ref 0.0–14.0)
NEUT#: 3.4 10*3/uL (ref 1.5–6.5)
NEUT%: 65.2 % (ref 38.4–76.8)
Platelets: 174 10*3/uL (ref 145–400)
RBC: 3.67 10*6/uL — ABNORMAL LOW (ref 3.70–5.45)
RDW: 14.6 % — ABNORMAL HIGH (ref 11.2–14.5)
WBC: 5.2 10*3/uL (ref 3.9–10.3)

## 2013-07-21 LAB — COMPREHENSIVE METABOLIC PANEL (CC13)
ALT: 12 U/L (ref 0–55)
ANION GAP: 12 meq/L — AB (ref 3–11)
AST: 15 U/L (ref 5–34)
Albumin: 3.2 g/dL — ABNORMAL LOW (ref 3.5–5.0)
Alkaline Phosphatase: 49 U/L (ref 40–150)
BUN: 24 mg/dL (ref 7.0–26.0)
CHLORIDE: 109 meq/L (ref 98–109)
CO2: 24 meq/L (ref 22–29)
Calcium: 10 mg/dL (ref 8.4–10.4)
Creatinine: 1.2 mg/dL — ABNORMAL HIGH (ref 0.6–1.1)
GLUCOSE: 87 mg/dL (ref 70–140)
POTASSIUM: 3.9 meq/L (ref 3.5–5.1)
SODIUM: 145 meq/L (ref 136–145)
TOTAL PROTEIN: 6.9 g/dL (ref 6.4–8.3)
Total Bilirubin: 0.83 mg/dL (ref 0.20–1.20)

## 2013-07-21 MED ORDER — TAMOXIFEN CITRATE 20 MG PO TABS: 20.0000 mg | ORAL_TABLET | Freq: Every day | ORAL | Status: DC
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
## 2013-07-21 NOTE — Telephone Encounter (Signed)
, °

## 2013-07-22 ENCOUNTER — Telehealth: Payer: Self-pay | Admitting: *Deleted

## 2013-07-22 NOTE — Telephone Encounter (Signed)
Called pt to inform of results concerning Chest X-ray. Explained to pt findings were stable and unchanged. Told pt the test will be repeated in Aug. Also explained to pt other labs are still pending and we will call her with those results as soon as we get them. Pt verbalized understanding. No further concerns. Message to be forwarded to [employer], PA-C.

## 2013-07-25 LAB — PROTEIN ELECTROPHORESIS, SERUM
ALPHA-1-GLOBULIN: 7.3 % — AB (ref 2.9–4.9)
ALPHA-2-GLOBULIN: 9.4 % (ref 7.1–11.8)
Albumin ELP: 50.4 % — ABNORMAL LOW (ref 55.8–66.1)
BETA 2: 3.5 % (ref 3.2–6.5)
BETA GLOBULIN: 5.4 % (ref 4.7–7.2)
GAMMA GLOBULIN: 24 % — AB (ref 11.1–18.8)
M-Spike, %: 1.11 g/dL
Total Protein, Serum Electrophoresis: 6.7 g/dL (ref 6.0–8.3)

## 2013-07-25 LAB — KAPPA/LAMBDA LIGHT CHAINS
Kappa free light chain: 1.1 mg/dL (ref 0.33–1.94)
Kappa:Lambda Ratio: 0.05 — ABNORMAL LOW (ref 0.26–1.65)
Lambda Free Lght Chn: 22.3 mg/dL — ABNORMAL HIGH (ref 0.57–2.63)

## 2013-07-26 ENCOUNTER — Telehealth: Payer: Self-pay

## 2013-07-26 NOTE — Telephone Encounter (Signed)
LMOVM - labs look good - all are stable.  Will repeat in August as planned.  If pt has any questions, call clinic and ask for Dr. Jana Hakim desk nurse.

## 2013-09-29 ENCOUNTER — Encounter: Payer: Self-pay | Admitting: Cardiology

## 2013-10-12 ENCOUNTER — Ambulatory Visit (INDEPENDENT_AMBULATORY_CARE_PROVIDER_SITE_OTHER): Payer: Commercial Managed Care - HMO | Admitting: General Surgery

## 2013-10-12 VITALS — BP 136/76 | HR 80 | Temp 98.0°F | Resp 18 | Ht 66.0 in | Wt 132.0 lb

## 2013-10-12 DIAGNOSIS — C50919 Malignant neoplasm of unspecified site of unspecified female breast: Secondary | ICD-10-CM

## 2013-10-12 NOTE — Progress Notes (Signed)
Procedure:  Right modified radical mastectomy. Left total mastectomy.  Date:  05/10/2012  Pathology: Right side T2N1, left side DCIS  Hx:  She is here for long-term followup visit. She denies any chest wall nodules or adenopathy. She is tolerating that tamoxifen. She has an upper respiratory infection and is being treated with Augmentin for this.  PE: Alison Gross looks well and is in no acute distress  Breasts-Absent bilaterally. Chest wall scars are present. No chest wall nodules.  Lymph nodes-No palpable axillary, cervical, or supraclavicular adenopathy.  Assessment:  Bilateral breast cancers-no clinical evidence of recurrence. Tolerating tamoxifen.  Plan:  Return visit 3 months.

## 2013-10-12 NOTE — Patient Instructions (Signed)
Call if you find any new lumps in your breasts or chest wall. 

## 2013-10-17 ENCOUNTER — Ambulatory Visit (HOSPITAL_COMMUNITY): Payer: Medicare HMO

## 2013-10-17 ENCOUNTER — Other Ambulatory Visit (HOSPITAL_COMMUNITY): Payer: Medicare HMO

## 2013-10-18 ENCOUNTER — Telehealth: Payer: Self-pay | Admitting: *Deleted

## 2013-10-18 ENCOUNTER — Other Ambulatory Visit (HOSPITAL_BASED_OUTPATIENT_CLINIC_OR_DEPARTMENT_OTHER): Payer: Commercial Managed Care - HMO

## 2013-10-18 ENCOUNTER — Ambulatory Visit (HOSPITAL_COMMUNITY)
Admission: RE | Admit: 2013-10-18 | Discharge: 2013-10-18 | Disposition: A | Discharge status: Home / Self Care | Payer: Medicare HMO | Source: Ambulatory Visit | Attending: Physician Assistant | Admitting: Physician Assistant

## 2013-10-18 DIAGNOSIS — R059 Cough, unspecified: Secondary | ICD-10-CM | POA: Diagnosis not present

## 2013-10-18 DIAGNOSIS — J90 Pleural effusion, not elsewhere classified: Secondary | ICD-10-CM | POA: Insufficient documentation

## 2013-10-18 DIAGNOSIS — C50919 Malignant neoplasm of unspecified site of unspecified female breast: Secondary | ICD-10-CM

## 2013-10-18 DIAGNOSIS — C50319 Malignant neoplasm of lower-inner quadrant of unspecified female breast: Secondary | ICD-10-CM

## 2013-10-18 DIAGNOSIS — R222 Localized swelling, mass and lump, trunk: Secondary | ICD-10-CM | POA: Insufficient documentation

## 2013-10-18 DIAGNOSIS — R05 Cough: Secondary | ICD-10-CM | POA: Insufficient documentation

## 2013-10-18 DIAGNOSIS — J988 Other specified respiratory disorders: Secondary | ICD-10-CM | POA: Diagnosis not present

## 2013-10-18 DIAGNOSIS — D472 Monoclonal gammopathy: Secondary | ICD-10-CM

## 2013-10-18 DIAGNOSIS — J398 Other specified diseases of upper respiratory tract: Secondary | ICD-10-CM | POA: Diagnosis not present

## 2013-10-18 DIAGNOSIS — N189 Chronic kidney disease, unspecified: Secondary | ICD-10-CM

## 2013-10-18 LAB — CBC WITH DIFFERENTIAL/PLATELET
BASO%: 1.2 % (ref 0.0–2.0)
Basophils Absolute: 0.1 10*3/uL (ref 0.0–0.1)
EOS%: 4.2 % (ref 0.0–7.0)
Eosinophils Absolute: 0.3 10*3/uL (ref 0.0–0.5)
HCT: 32 % — ABNORMAL LOW (ref 34.8–46.6)
HGB: 10.2 g/dL — ABNORMAL LOW (ref 11.6–15.9)
LYMPH%: 8.1 % — ABNORMAL LOW (ref 14.0–49.7)
MCH: 28.2 pg (ref 25.1–34.0)
MCHC: 31.9 g/dL (ref 31.5–36.0)
MCV: 88.4 fL (ref 79.5–101.0)
MONO#: 0.5 10*3/uL (ref 0.1–0.9)
MONO%: 7.4 % (ref 0.0–14.0)
NEUT#: 5.5 10*3/uL (ref 1.5–6.5)
NEUT%: 79.1 % — ABNORMAL HIGH (ref 38.4–76.8)
Platelets: 275 10*3/uL (ref 145–400)
RBC: 3.62 10*6/uL — ABNORMAL LOW (ref 3.70–5.45)
RDW: 14.5 % (ref 11.2–14.5)
WBC: 7 10*3/uL (ref 3.9–10.3)
lymph#: 0.6 10*3/uL — ABNORMAL LOW (ref 0.9–3.3)

## 2013-10-18 NOTE — Telephone Encounter (Signed)
[place] with [facility] called report of today's CXR.  Results given are "Persistent right peritracheal mass lesion.  Increasing right-sided pleural effusion."  Will notify [facility] of these results.  Next scheduled f/u is on 10-25-2013.

## 2013-10-19 ENCOUNTER — Other Ambulatory Visit: Payer: Self-pay

## 2013-10-19 DIAGNOSIS — C50319 Malignant neoplasm of lower-inner quadrant of unspecified female breast: Secondary | ICD-10-CM

## 2013-10-19 NOTE — Progress Notes (Signed)
Per Dr. Lindi Adie note - PET CT ordered.  [facility] managed care notified.

## 2013-10-20 LAB — KAPPA/LAMBDA LIGHT CHAINS
KAPPA FREE LGHT CHN: 2.49 mg/dL — AB (ref 0.33–1.94)
KAPPA LAMBDA RATIO: 0.1 — AB (ref 0.26–1.65)
Lambda Free Lght Chn: 25.6 mg/dL — ABNORMAL HIGH (ref 0.57–2.63)

## 2013-10-20 LAB — PROTEIN ELECTROPHORESIS, SERUM
ALBUMIN ELP: 52.6 % — AB (ref 55.8–66.1)
Alpha-1-Globulin: 3.8 % (ref 2.9–4.9)
Alpha-2-Globulin: 12.4 % — ABNORMAL HIGH (ref 7.1–11.8)
Beta 2: 3.3 % (ref 3.2–6.5)
Beta Globulin: 5.8 % (ref 4.7–7.2)
GAMMA GLOBULIN: 22.1 % — AB (ref 11.1–18.8)
M-Spike, %: 1.1 g/dL
Total Protein, Serum Electrophoresis: 6.8 g/dL (ref 6.0–8.3)

## 2013-10-20 LAB — COMPREHENSIVE METABOLIC PANEL
ALT: 15 U/L (ref 0–35)
AST: 17 U/L (ref 0–37)
Albumin: 3.5 g/dL (ref 3.5–5.2)
Alkaline Phosphatase: 46 U/L (ref 39–117)
BILIRUBIN TOTAL: 0.5 mg/dL (ref 0.2–1.2)
BUN: 27 mg/dL — ABNORMAL HIGH (ref 6–23)
CO2: 27 mEq/L (ref 19–32)
CREATININE: 1.4 mg/dL — AB (ref 0.50–1.10)
Calcium: 9.1 mg/dL (ref 8.4–10.5)
Chloride: 108 mEq/L (ref 96–112)
GLUCOSE: 87 mg/dL (ref 70–99)
Potassium: 3.6 mEq/L (ref 3.5–5.3)
SODIUM: 143 meq/L (ref 135–145)
TOTAL PROTEIN: 6.8 g/dL (ref 6.0–8.3)

## 2013-10-24 ENCOUNTER — Other Ambulatory Visit: Payer: Self-pay

## 2013-10-24 DIAGNOSIS — C50311 Malignant neoplasm of lower-inner quadrant of right female breast: Secondary | ICD-10-CM

## 2013-10-24 DIAGNOSIS — C50312 Malignant neoplasm of lower-inner quadrant of left female breast: Secondary | ICD-10-CM

## 2013-10-25 ENCOUNTER — Telehealth: Payer: Self-pay | Admitting: Oncology

## 2013-10-25 ENCOUNTER — Ambulatory Visit (HOSPITAL_BASED_OUTPATIENT_CLINIC_OR_DEPARTMENT_OTHER): Payer: Medicare HMO | Admitting: Oncology

## 2013-10-25 VITALS — BP 139/56 | HR 62 | Temp 98.2°F | Resp 20 | Ht 66.0 in | Wt 131.7 lb

## 2013-10-25 DIAGNOSIS — Z853 Personal history of malignant neoplasm of breast: Secondary | ICD-10-CM

## 2013-10-25 DIAGNOSIS — N183 Chronic kidney disease, stage 3 unspecified: Secondary | ICD-10-CM

## 2013-10-25 DIAGNOSIS — Z79811 Long term (current) use of aromatase inhibitors: Secondary | ICD-10-CM

## 2013-10-25 DIAGNOSIS — I499 Cardiac arrhythmia, unspecified: Secondary | ICD-10-CM

## 2013-10-25 DIAGNOSIS — C50319 Malignant neoplasm of lower-inner quadrant of unspecified female breast: Secondary | ICD-10-CM

## 2013-10-25 NOTE — Telephone Encounter (Signed)
per pof to sch pt appt-sch & gave pt copy of sch °
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
## 2013-10-26 ENCOUNTER — Other Ambulatory Visit: Payer: Self-pay | Admitting: Oncology

## 2013-10-26 NOTE — Progress Notes (Unsigned)
Kamalani's bone density 07/14/2013 at [facility] showed osteoporosis, with a T score of -3.0

## 2013-10-28 ENCOUNTER — Ambulatory Visit (HOSPITAL_COMMUNITY)
Admission: RE | Admit: 2013-10-28 | Discharge: 2013-10-28 | Disposition: A | Discharge status: Home / Self Care | Payer: Medicare HMO | Source: Ambulatory Visit | Attending: Hematology and Oncology | Admitting: Hematology and Oncology

## 2013-10-28 ENCOUNTER — Encounter (HOSPITAL_COMMUNITY)
Admission: RE | Admit: 2013-10-28 | Discharge: 2013-10-28 | Disposition: A | Discharge status: Home / Self Care | Payer: Medicare HMO | Source: Ambulatory Visit | Attending: Hematology and Oncology | Admitting: Hematology and Oncology

## 2013-10-28 ENCOUNTER — Encounter (HOSPITAL_COMMUNITY): Payer: Self-pay

## 2013-10-28 ENCOUNTER — Other Ambulatory Visit: Payer: Self-pay | Admitting: Hematology and Oncology

## 2013-10-28 DIAGNOSIS — E049 Nontoxic goiter, unspecified: Secondary | ICD-10-CM | POA: Insufficient documentation

## 2013-10-28 DIAGNOSIS — C50311 Malignant neoplasm of lower-inner quadrant of right female breast: Secondary | ICD-10-CM

## 2013-10-28 DIAGNOSIS — J90 Pleural effusion, not elsewhere classified: Secondary | ICD-10-CM | POA: Diagnosis not present

## 2013-10-28 DIAGNOSIS — C50312 Malignant neoplasm of lower-inner quadrant of left female breast: Secondary | ICD-10-CM

## 2013-10-28 DIAGNOSIS — C50319 Malignant neoplasm of lower-inner quadrant of unspecified female breast: Secondary | ICD-10-CM | POA: Diagnosis present

## 2013-10-28 LAB — GLUCOSE, CAPILLARY: Glucose-Capillary: 87 mg/dL (ref 70–99)

## 2013-10-28 MED ORDER — IOHEXOL 300 MG/ML  SOLN
50.0000 mL | Freq: Once | INTRAMUSCULAR | Status: AC | PRN
  Administered 2013-10-28: 50 mL via INTRAVENOUS

## 2013-10-28 MED ORDER — FLUDEOXYGLUCOSE F - 18 (FDG) INJECTION
6.6800 | Freq: Once | INTRAVENOUS | Status: AC | PRN
  Administered 2013-10-28: 6.68 via INTRAVENOUS

## 2013-10-29 ENCOUNTER — Other Ambulatory Visit: Payer: Self-pay | Admitting: Oncology

## 2013-10-29 DIAGNOSIS — C50919 Malignant neoplasm of unspecified site of unspecified female breast: Secondary | ICD-10-CM

## 2013-10-29 NOTE — Progress Notes (Signed)
Called Alison Gross and told her I don't see evidence of active cancer on her PET scan, which is very favorable. She does have marked thyromegaly and multiple areas of hypermetabolism in her goiter, with SUV max of to 10.4. This really does need to be biopsied.  On the other hand her right pleural effusion has been long-standing and stable.. I am less concerned regarding that.  In our discussion we agreed we would obtain a thyroid biopsy and then think about thoracentesis afterwards. She is very comfortable with this plan.

## 2013-10-31 ENCOUNTER — Other Ambulatory Visit: Payer: Self-pay | Admitting: Oncology

## 2013-10-31 DIAGNOSIS — J90 Pleural effusion, not elsewhere classified: Secondary | ICD-10-CM

## 2013-11-02 ENCOUNTER — Telehealth: Payer: Self-pay | Admitting: Oncology

## 2013-11-02 NOTE — Telephone Encounter (Signed)
CENTRAL SCHEDULING WILL CONTACT PT RE CX PER 8/22 POF AND THORACENTESIS PER 8/24 POF. NOT OTHER ORDERS PER POF'S

## 2013-11-10 ENCOUNTER — Other Ambulatory Visit: Payer: Self-pay | Admitting: *Deleted

## 2013-11-10 DIAGNOSIS — C50319 Malignant neoplasm of lower-inner quadrant of unspecified female breast: Secondary | ICD-10-CM

## 2013-11-11 ENCOUNTER — Other Ambulatory Visit: Payer: Self-pay | Admitting: *Deleted

## 2013-11-11 ENCOUNTER — Other Ambulatory Visit: Payer: Self-pay | Admitting: Oncology

## 2013-11-11 DIAGNOSIS — C50319 Malignant neoplasm of lower-inner quadrant of unspecified female breast: Secondary | ICD-10-CM

## 2013-11-16 ENCOUNTER — Ambulatory Visit (HOSPITAL_COMMUNITY)
Admission: RE | Admit: 2013-11-16 | Discharge: 2013-11-16 | Disposition: A | Discharge status: Home / Self Care | Payer: Medicare HMO | Source: Ambulatory Visit | Attending: Oncology | Admitting: Oncology

## 2013-11-16 ENCOUNTER — Other Ambulatory Visit: Payer: Self-pay | Admitting: *Deleted

## 2013-11-16 ENCOUNTER — Ambulatory Visit (HOSPITAL_COMMUNITY): Payer: Medicare HMO

## 2013-11-16 ENCOUNTER — Other Ambulatory Visit: Payer: Self-pay | Admitting: Cardiology

## 2013-11-16 ENCOUNTER — Ambulatory Visit (HOSPITAL_COMMUNITY)
Admission: RE | Admit: 2013-11-16 | Discharge: 2013-11-16 | Disposition: A | Discharge status: Home / Self Care | Payer: Medicare HMO | Source: Ambulatory Visit | Attending: Radiology | Admitting: Radiology

## 2013-11-16 ENCOUNTER — Other Ambulatory Visit (HOSPITAL_COMMUNITY): Payer: Commercial Managed Care - HMO

## 2013-11-16 ENCOUNTER — Other Ambulatory Visit (HOSPITAL_COMMUNITY): Payer: Self-pay | Admitting: Radiology

## 2013-11-16 DIAGNOSIS — C50319 Malignant neoplasm of lower-inner quadrant of unspecified female breast: Secondary | ICD-10-CM | POA: Diagnosis not present

## 2013-11-16 DIAGNOSIS — J90 Pleural effusion, not elsewhere classified: Secondary | ICD-10-CM | POA: Insufficient documentation

## 2013-11-16 DIAGNOSIS — R221 Localized swelling, mass and lump, neck: Secondary | ICD-10-CM | POA: Diagnosis not present

## 2013-11-16 DIAGNOSIS — R220 Localized swelling, mass and lump, head: Secondary | ICD-10-CM | POA: Insufficient documentation

## 2013-11-16 DIAGNOSIS — C3490 Malignant neoplasm of unspecified part of unspecified bronchus or lung: Secondary | ICD-10-CM | POA: Insufficient documentation

## 2013-11-16 DIAGNOSIS — J984 Other disorders of lung: Secondary | ICD-10-CM | POA: Insufficient documentation

## 2013-11-16 DIAGNOSIS — C50919 Malignant neoplasm of unspecified site of unspecified female breast: Secondary | ICD-10-CM

## 2013-11-16 LAB — BODY FLUID CELL COUNT WITH DIFFERENTIAL
LYMPHS FL: 79 %
MONOCYTE-MACROPHAGE-SEROUS FLUID: 19 % — AB (ref 50–90)
NEUTROPHIL FLUID: 2 % (ref 0–25)
WBC FLUID: 1130 uL — AB (ref 0–1000)

## 2013-11-16 NOTE — Progress Notes (Signed)
Patient is currently taking Xarelto for PAF on ECG in 09/2013, thyroid US was reviewed by Dr. Kathlene Cote and approved for bilateral thyroid nodule FNA once Xarelto held x 1 dose. Patient is scheduled for thyroid nodule FNA next Tuesday 11/22/13 at 1:00 pm, she was instructed to hold her Xarelto Monday evening if this was ok by her cardiologist. We have contacted Dr. Irven Shelling office today and left a message regarding holding Xarelto. She was instructed to contact her cardiologist prior to next week to discuss holding 1 dose of Xarelto for procedure. I have discussed the above with Dr. Jana Hakim today.  Tsosie Billing PA-C Interventional Radiology  11/16/13  2:53 PM

## 2013-11-16 NOTE — Procedures (Signed)
Successful US guided right thoracentesis. Yielded 1 liter of clear yellow fluid. Pt tolerated procedure well. No immediate complications.  Specimen was sent for labs. CXR ordered.  Tsosie Billing D PA-C 11/16/2013 2:43 PM

## 2013-11-17 LAB — ALBUMIN, FLUID (OTHER): Albumin, Fluid: 1.8 g/dL

## 2013-11-17 LAB — LACTATE DEHYDROGENASE, PLEURAL OR PERITONEAL FLUID: LD, Fluid: 63 U/L — ABNORMAL HIGH (ref 3–23)

## 2013-11-18 ENCOUNTER — Other Ambulatory Visit: Payer: Self-pay | Admitting: *Deleted

## 2013-11-18 DIAGNOSIS — C50319 Malignant neoplasm of lower-inner quadrant of unspecified female breast: Secondary | ICD-10-CM

## 2013-11-19 LAB — BODY FLUID CULTURE: Culture: NO GROWTH

## 2013-11-20 ENCOUNTER — Other Ambulatory Visit: Payer: Self-pay | Admitting: Oncology

## 2013-11-21 ENCOUNTER — Telehealth: Payer: Self-pay | Admitting: Oncology

## 2013-11-21 NOTE — Telephone Encounter (Signed)
s.w. pt and advised on 9.22 appt....pt ok and aware

## 2013-11-22 ENCOUNTER — Ambulatory Visit (HOSPITAL_COMMUNITY)
Admission: RE | Admit: 2013-11-22 | Discharge: 2013-11-22 | Disposition: A | Discharge status: Home / Self Care | Payer: Medicare HMO | Source: Ambulatory Visit | Attending: Oncology | Admitting: Oncology

## 2013-11-22 ENCOUNTER — Other Ambulatory Visit: Payer: Self-pay | Admitting: Oncology

## 2013-11-22 DIAGNOSIS — E042 Nontoxic multinodular goiter: Secondary | ICD-10-CM | POA: Diagnosis present

## 2013-11-22 DIAGNOSIS — C50919 Malignant neoplasm of unspecified site of unspecified female breast: Secondary | ICD-10-CM | POA: Insufficient documentation

## 2013-11-22 NOTE — Procedures (Signed)
Successful US guided FNA of (R) and (L)thyroid nodules X 4 each. No complications. See PACS report for full report.   Ascencion Dike PA-C Interventional Radiology 11/22/2013 1:45 PM

## 2013-11-29 ENCOUNTER — Encounter: Payer: Self-pay | Admitting: Nurse Practitioner

## 2013-11-29 ENCOUNTER — Other Ambulatory Visit: Payer: Self-pay | Admitting: Oncology

## 2013-11-29 ENCOUNTER — Ambulatory Visit (HOSPITAL_BASED_OUTPATIENT_CLINIC_OR_DEPARTMENT_OTHER): Payer: Commercial Managed Care - HMO | Admitting: Nurse Practitioner

## 2013-11-29 ENCOUNTER — Ambulatory Visit: Payer: Commercial Managed Care - HMO

## 2013-11-29 ENCOUNTER — Other Ambulatory Visit (HOSPITAL_BASED_OUTPATIENT_CLINIC_OR_DEPARTMENT_OTHER): Payer: Commercial Managed Care - HMO

## 2013-11-29 ENCOUNTER — Telehealth: Payer: Self-pay | Admitting: Nurse Practitioner

## 2013-11-29 VITALS — BP 121/69 | HR 60 | Temp 98.3°F | Resp 18 | Ht 66.0 in | Wt 130.0 lb

## 2013-11-29 DIAGNOSIS — Z79810 Long term (current) use of selective estrogen receptor modulators (SERMs): Secondary | ICD-10-CM

## 2013-11-29 DIAGNOSIS — M810 Age-related osteoporosis without current pathological fracture: Secondary | ICD-10-CM

## 2013-11-29 DIAGNOSIS — C499 Malignant neoplasm of connective and soft tissue, unspecified: Secondary | ICD-10-CM

## 2013-11-29 DIAGNOSIS — C50311 Malignant neoplasm of lower-inner quadrant of right female breast: Secondary | ICD-10-CM

## 2013-11-29 DIAGNOSIS — N183 Chronic kidney disease, stage 3 unspecified: Secondary | ICD-10-CM

## 2013-11-29 DIAGNOSIS — C50319 Malignant neoplasm of lower-inner quadrant of unspecified female breast: Secondary | ICD-10-CM

## 2013-11-29 DIAGNOSIS — D472 Monoclonal gammopathy: Secondary | ICD-10-CM

## 2013-11-29 DIAGNOSIS — Z853 Personal history of malignant neoplasm of breast: Secondary | ICD-10-CM

## 2013-11-29 HISTORY — DX: Age-related osteoporosis without current pathological fracture: M81.0

## 2013-11-29 LAB — CBC WITH DIFFERENTIAL/PLATELET
BASO%: 1.4 % (ref 0.0–2.0)
Basophils Absolute: 0.1 10*3/uL (ref 0.0–0.1)
EOS%: 8.7 % — AB (ref 0.0–7.0)
Eosinophils Absolute: 0.4 10*3/uL (ref 0.0–0.5)
HCT: 33.3 % — ABNORMAL LOW (ref 34.8–46.6)
HGB: 10.5 g/dL — ABNORMAL LOW (ref 11.6–15.9)
LYMPH%: 19.4 % (ref 14.0–49.7)
MCH: 28 pg (ref 25.1–34.0)
MCHC: 31.4 g/dL — AB (ref 31.5–36.0)
MCV: 89 fL (ref 79.5–101.0)
MONO#: 0.6 10*3/uL (ref 0.1–0.9)
MONO%: 13.7 % (ref 0.0–14.0)
NEUT#: 2.5 10*3/uL (ref 1.5–6.5)
NEUT%: 56.8 % (ref 38.4–76.8)
PLATELETS: 230 10*3/uL (ref 145–400)
RBC: 3.74 10*6/uL (ref 3.70–5.45)
RDW: 15.6 % — AB (ref 11.2–14.5)
WBC: 4.3 10*3/uL (ref 3.9–10.3)
lymph#: 0.8 10*3/uL — ABNORMAL LOW (ref 0.9–3.3)

## 2013-11-29 LAB — COMPREHENSIVE METABOLIC PANEL (CC13)
ALK PHOS: 64 U/L (ref 40–150)
ALT: 18 U/L (ref 0–55)
AST: 23 U/L (ref 5–34)
Albumin: 3.2 g/dL — ABNORMAL LOW (ref 3.5–5.0)
Anion Gap: 5 mEq/L (ref 3–11)
BILIRUBIN TOTAL: 0.81 mg/dL (ref 0.20–1.20)
BUN: 25.8 mg/dL (ref 7.0–26.0)
CO2: 29 mEq/L (ref 22–29)
Calcium: 10 mg/dL (ref 8.4–10.4)
Chloride: 109 mEq/L (ref 98–109)
Creatinine: 1.3 mg/dL — ABNORMAL HIGH (ref 0.6–1.1)
GLUCOSE: 85 mg/dL (ref 70–140)
POTASSIUM: 4.6 meq/L (ref 3.5–5.1)
SODIUM: 143 meq/L (ref 136–145)
TOTAL PROTEIN: 7 g/dL (ref 6.4–8.3)

## 2013-11-29 MED ORDER — ANASTROZOLE 1 MG PO TABS: 1.0000 mg | ORAL_TABLET | Freq: Every day | ORAL | Status: DC

## 2013-11-29 NOTE — Telephone Encounter (Signed)
per pof to sch pt appt-sch & gave pt copy of sch °
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
## 2013-12-01 LAB — KAPPA/LAMBDA LIGHT CHAINS
Kappa free light chain: 2.05 mg/dL — ABNORMAL HIGH (ref 0.33–1.94)
Kappa:Lambda Ratio: 0.09 — ABNORMAL LOW (ref 0.26–1.65)
LAMBDA FREE LGHT CHN: 23.6 mg/dL — AB (ref 0.57–2.63)

## 2013-12-01 LAB — PROTEIN ELECTROPHORESIS, SERUM
ALPHA-1-GLOBULIN: 5.5 % — AB (ref 2.9–4.9)
Albumin ELP: 52.6 % — ABNORMAL LOW (ref 55.8–66.1)
Alpha-2-Globulin: 9.1 % (ref 7.1–11.8)
Beta 2: 3.8 % (ref 3.2–6.5)
Beta Globulin: 5.1 % (ref 4.7–7.2)
GAMMA GLOBULIN: 23.9 % — AB (ref 11.1–18.8)
M-SPIKE, %: 1.16 g/dL
Total Protein, Serum Electrophoresis: 6.7 g/dL (ref 6.0–8.3)

## 2013-12-07 ENCOUNTER — Other Ambulatory Visit: Payer: Self-pay | Admitting: Oncology

## 2013-12-07 DIAGNOSIS — Z853 Personal history of malignant neoplasm of breast: Secondary | ICD-10-CM

## 2013-12-26 ENCOUNTER — Ambulatory Visit
Admission: RE | Admit: 2013-12-26 | Discharge: 2013-12-26 | Disposition: A | Discharge status: Home / Self Care | Payer: Commercial Managed Care - HMO | Source: Ambulatory Visit | Attending: Oncology | Admitting: Oncology

## 2013-12-26 DIAGNOSIS — Z853 Personal history of malignant neoplasm of breast: Secondary | ICD-10-CM

## 2014-01-18 ENCOUNTER — Other Ambulatory Visit (HOSPITAL_BASED_OUTPATIENT_CLINIC_OR_DEPARTMENT_OTHER): Payer: Commercial Managed Care - HMO

## 2014-01-18 DIAGNOSIS — C50319 Malignant neoplasm of lower-inner quadrant of unspecified female breast: Secondary | ICD-10-CM

## 2014-01-18 DIAGNOSIS — D472 Monoclonal gammopathy: Secondary | ICD-10-CM

## 2014-01-18 DIAGNOSIS — Z853 Personal history of malignant neoplasm of breast: Secondary | ICD-10-CM

## 2014-01-18 DIAGNOSIS — N189 Chronic kidney disease, unspecified: Secondary | ICD-10-CM

## 2014-01-18 LAB — CBC WITH DIFFERENTIAL/PLATELET
BASO%: 2.2 % — ABNORMAL HIGH (ref 0.0–2.0)
Basophils Absolute: 0.1 10*3/uL (ref 0.0–0.1)
EOS%: 7.4 % — AB (ref 0.0–7.0)
Eosinophils Absolute: 0.3 10*3/uL (ref 0.0–0.5)
HCT: 36.4 % (ref 34.8–46.6)
HGB: 11.4 g/dL — ABNORMAL LOW (ref 11.6–15.9)
LYMPH%: 20.1 % (ref 14.0–49.7)
MCH: 27.5 pg (ref 25.1–34.0)
MCHC: 31.3 g/dL — AB (ref 31.5–36.0)
MCV: 87.9 fL (ref 79.5–101.0)
MONO#: 0.6 10*3/uL (ref 0.1–0.9)
MONO%: 13.6 % (ref 0.0–14.0)
NEUT#: 2.6 10*3/uL (ref 1.5–6.5)
NEUT%: 56.7 % (ref 38.4–76.8)
PLATELETS: 270 10*3/uL (ref 145–400)
RBC: 4.15 10*6/uL (ref 3.70–5.45)
RDW: 15.3 % — ABNORMAL HIGH (ref 11.2–14.5)
WBC: 4.7 10*3/uL (ref 3.9–10.3)
lymph#: 0.9 10*3/uL (ref 0.9–3.3)

## 2014-01-18 LAB — COMPREHENSIVE METABOLIC PANEL (CC13)
ALT: 35 U/L (ref 0–55)
AST: 34 U/L (ref 5–34)
Albumin: 3.4 g/dL — ABNORMAL LOW (ref 3.5–5.0)
Alkaline Phosphatase: 105 U/L (ref 40–150)
Anion Gap: 6 mEq/L (ref 3–11)
BILIRUBIN TOTAL: 0.68 mg/dL (ref 0.20–1.20)
BUN: 28.4 mg/dL — AB (ref 7.0–26.0)
CALCIUM: 10.6 mg/dL — AB (ref 8.4–10.4)
CHLORIDE: 108 meq/L (ref 98–109)
CO2: 29 mEq/L (ref 22–29)
CREATININE: 1.4 mg/dL — AB (ref 0.6–1.1)
Glucose: 103 mg/dl (ref 70–140)
Potassium: 4.1 mEq/L (ref 3.5–5.1)
Sodium: 143 mEq/L (ref 136–145)
Total Protein: 7.2 g/dL (ref 6.4–8.3)

## 2014-01-20 LAB — PROTEIN ELECTROPHORESIS, SERUM
Albumin ELP: 52.8 % — ABNORMAL LOW (ref 55.8–66.1)
Alpha-1-Globulin: 2.8 % — ABNORMAL LOW (ref 2.9–4.9)
Alpha-2-Globulin: 11.7 % (ref 7.1–11.8)
BETA GLOBULIN: 5.4 % (ref 4.7–7.2)
Beta 2: 3.7 % (ref 3.2–6.5)
Gamma Globulin: 23.6 % — ABNORMAL HIGH (ref 11.1–18.8)
M-SPIKE, %: 1.18 g/dL
Total Protein, Serum Electrophoresis: 7.3 g/dL (ref 6.0–8.3)

## 2014-01-20 LAB — KAPPA/LAMBDA LIGHT CHAINS
Kappa free light chain: 2.29 mg/dL — ABNORMAL HIGH (ref 0.33–1.94)
Kappa:Lambda Ratio: 0.09 — ABNORMAL LOW (ref 0.26–1.65)
LAMBDA FREE LGHT CHN: 25.1 mg/dL — AB (ref 0.57–2.63)

## 2014-01-25 ENCOUNTER — Telehealth: Payer: Self-pay | Admitting: *Deleted

## 2014-01-25 ENCOUNTER — Ambulatory Visit (HOSPITAL_BASED_OUTPATIENT_CLINIC_OR_DEPARTMENT_OTHER): Payer: Commercial Managed Care - HMO | Admitting: Oncology

## 2014-01-25 ENCOUNTER — Telehealth: Payer: Self-pay | Admitting: Oncology

## 2014-01-25 VITALS — BP 130/67 | HR 73 | Temp 98.5°F | Resp 18 | Ht 66.0 in | Wt 126.2 lb

## 2014-01-25 DIAGNOSIS — D472 Monoclonal gammopathy: Secondary | ICD-10-CM

## 2014-01-25 DIAGNOSIS — C50812 Malignant neoplasm of overlapping sites of left female breast: Secondary | ICD-10-CM

## 2014-01-25 DIAGNOSIS — N183 Chronic kidney disease, stage 3 (moderate): Secondary | ICD-10-CM

## 2014-01-25 DIAGNOSIS — J90 Pleural effusion, not elsewhere classified: Secondary | ICD-10-CM

## 2014-01-25 DIAGNOSIS — D0512 Intraductal carcinoma in situ of left breast: Secondary | ICD-10-CM

## 2014-01-25 DIAGNOSIS — E010 Iodine-deficiency related diffuse (endemic) goiter: Secondary | ICD-10-CM

## 2014-01-25 DIAGNOSIS — C50311 Malignant neoplasm of lower-inner quadrant of right female breast: Secondary | ICD-10-CM

## 2014-01-25 DIAGNOSIS — M810 Age-related osteoporosis without current pathological fracture: Secondary | ICD-10-CM

## 2014-01-25 NOTE — Telephone Encounter (Signed)
, °
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
## 2014-01-25 NOTE — Telephone Encounter (Signed)
Per staff message and POF I have scheduled appts. Advised scheduler of appts. JMW  

## 2014-01-26 NOTE — Addendum Note (Signed)
Addended by: Laureen Abrahams on: 01/26/2014 05:19 PM   Modules accepted: Medications

## 2014-02-19 ENCOUNTER — Other Ambulatory Visit: Payer: Self-pay | Admitting: Nurse Practitioner

## 2014-02-19 DIAGNOSIS — D0512 Intraductal carcinoma in situ of left breast: Secondary | ICD-10-CM

## 2014-02-20 ENCOUNTER — Other Ambulatory Visit (HOSPITAL_BASED_OUTPATIENT_CLINIC_OR_DEPARTMENT_OTHER): Payer: Commercial Managed Care - HMO

## 2014-02-20 ENCOUNTER — Ambulatory Visit (HOSPITAL_BASED_OUTPATIENT_CLINIC_OR_DEPARTMENT_OTHER): Payer: Commercial Managed Care - HMO

## 2014-02-20 DIAGNOSIS — C50812 Malignant neoplasm of overlapping sites of left female breast: Secondary | ICD-10-CM

## 2014-02-20 DIAGNOSIS — C50319 Malignant neoplasm of lower-inner quadrant of unspecified female breast: Secondary | ICD-10-CM

## 2014-02-20 DIAGNOSIS — M810 Age-related osteoporosis without current pathological fracture: Secondary | ICD-10-CM

## 2014-02-20 DIAGNOSIS — C50311 Malignant neoplasm of lower-inner quadrant of right female breast: Secondary | ICD-10-CM

## 2014-02-20 LAB — BASIC METABOLIC PANEL (CC13)
Anion Gap: 8 mEq/L (ref 3–11)
BUN: 26 mg/dL (ref 7.0–26.0)
CHLORIDE: 107 meq/L (ref 98–109)
CO2: 27 meq/L (ref 22–29)
CREATININE: 1.4 mg/dL — AB (ref 0.6–1.1)
Calcium: 10.7 mg/dL — ABNORMAL HIGH (ref 8.4–10.4)
EGFR: 42 mL/min/{1.73_m2} — ABNORMAL LOW (ref >90)
GLUCOSE: 87 mg/dL (ref 70–140)
POTASSIUM: 3.7 meq/L (ref 3.5–5.1)
Sodium: 141 mEq/L (ref 136–145)

## 2014-02-20 LAB — CBC WITH DIFFERENTIAL/PLATELET
BASO%: 0.9 % (ref 0.0–2.0)
BASOS ABS: 0 10*3/uL (ref 0.0–0.1)
EOS ABS: 0.4 10*3/uL (ref 0.0–0.5)
EOS%: 9.7 % — ABNORMAL HIGH (ref 0.0–7.0)
HEMATOCRIT: 36.2 % (ref 34.8–46.6)
HEMOGLOBIN: 11.6 g/dL (ref 11.6–15.9)
LYMPH#: 0.6 10*3/uL — AB (ref 0.9–3.3)
LYMPH%: 13.8 % — ABNORMAL LOW (ref 14.0–49.7)
MCH: 27.6 pg (ref 25.1–34.0)
MCHC: 32 g/dL (ref 31.5–36.0)
MCV: 86.2 fL (ref 79.5–101.0)
MONO#: 0.5 10*3/uL (ref 0.1–0.9)
MONO%: 10.9 % (ref 0.0–14.0)
NEUT#: 2.9 10*3/uL (ref 1.5–6.5)
NEUT%: 64.7 % (ref 38.4–76.8)
Platelets: 204 10*3/uL (ref 145–400)
RBC: 4.2 10*6/uL (ref 3.70–5.45)
RDW: 15.1 % — AB (ref 11.2–14.5)
WBC: 4.4 10*3/uL (ref 3.9–10.3)

## 2014-02-20 MED ORDER — ZOLEDRONIC ACID 4 MG/100ML IV SOLN
4.0000 mg | Freq: Once | INTRAVENOUS | Status: AC
  Administered 2014-02-20: 4 mg via INTRAVENOUS
  Filled 2014-02-20: qty 100

## 2014-02-20 MED ORDER — SODIUM CHLORIDE 0.9 % IV SOLN
Freq: Once | INTRAVENOUS | Status: AC
  Administered 2014-02-20: 12:00:00 via INTRAVENOUS

## 2014-02-20 NOTE — Progress Notes (Signed)
1215- Patient tolerated 1st zometa well without difficulty

## 2014-02-20 NOTE — Patient Instructions (Signed)

## 2014-03-02 ENCOUNTER — Other Ambulatory Visit: Payer: Self-pay | Admitting: Nurse Practitioner

## 2014-03-06 ENCOUNTER — Other Ambulatory Visit: Payer: Commercial Managed Care - HMO

## 2014-03-06 ENCOUNTER — Ambulatory Visit: Payer: Commercial Managed Care - HMO | Admitting: Nurse Practitioner

## 2014-05-23 ENCOUNTER — Encounter: Payer: Self-pay | Admitting: General Surgery

## 2014-05-23 NOTE — Progress Notes (Signed)
Patient ID: Alison Gross, female   DOB: 11-29-1932, 81 y.o.   MRN: [id number] Alison Gross 05/23/2014 4:21 PM Location: [facility] Patient #: [id number] DOB: [birth year] Married / Language: English / Race: Black or African American Female History of Present Illness Alison Hollingshead MD; 05/23/2014 4:53 PM) Patient words: ltf breast check.  The patient is a 81 year old female    Note:Procedure: Right modified radical mastectomy. Left total mastectomy.  Date: 05/10/2012  Pathology: Right side T2N1, left side DCIS  Hx: She is here for long-term followup visit. She denies any chest wall nodules or adenopathy. She switched from tamoxifen to Arimidex. She is due to see Dr. Jana Gross in May.  PE: Alison Gross looks well and is in no acute distress  Breasts-Absent bilaterally. Chest wall scars are present. No chest wall nodules.  Lymph nodes-No palpable axillary, cervical, or supraclavicular adenopathy.  A.  Other Problems Alison Gross, [place]; 05/23/2014 4:22 PM) Atrial Fibrillation Breast Cancer Diabetes Mellitus Heart murmur High blood pressure Hypercholesterolemia Thyroid Disease  Past Surgical History Alison Gross, [place]; 05/23/2014 4:22 PM) Breast Biopsy Bilateral. Cataract Surgery Bilateral. Colon Polyp Removal - Colonoscopy Mastectomy Bilateral. Thyroid Surgery  Diagnostic Studies History Alison Gross, [place]; 05/23/2014 4:22 PM) Colonoscopy 5-10 years ago Mammogram 1-3 years ago Pap Smear >5 years ago  Allergies Alison Gross, CMA; 05/23/2014 4:22 PM) No Known Drug Allergies 05/23/2014  Medication History Alison Gross, [place]; 05/23/2014 4:23 PM) Accu-Chek Aviva Plus (In Vitro) Active. Accu-Chek Softclix Lancets Active. Anastrozole (1MG  Tablet, Oral) Active. Losartan Potassium-HCTZ (100-12.5MG  Tablet, Oral) Active. Xarelto (20MG  Tablet, Oral) Active. Metoprolol Tartrate (50MG  Tablet, Oral) Active.  Social  History Alison Gross, [place]; 05/23/2014 4:22 PM) Alcohol use Remotely quit alcohol use. Caffeine use Carbonated beverages, Coffee, Tea. No drug use Tobacco use Former smoker.  Family History Alison Gross, [place]; 05/23/2014 4:22 PM) Diabetes Mellitus Brother, Father, Sister. Hypertension Father. Prostate Cancer Brother.  Pregnancy / Birth History Alison Gross, [place]; 05/23/2014 4:22 PM) Age of menopause 46-50 Gravida 2 Maternal age 7-25 Para 2     Review of Systems Alison Gross CMA; 05/23/2014 4:22 PM) General Present- Weight Loss. Not Present- Appetite Loss, Chills, Fatigue, Fever, Night Sweats and Weight Gain. Skin Not Present- Change in Wart/Mole, Dryness, Hives, Jaundice, New Lesions, Non-Healing Wounds, Rash and Ulcer. HEENT Present- Wears glasses/contact lenses. Not Present- Earache, Hearing Loss, Hoarseness, Nose Bleed, Oral Ulcers, Ringing in the Ears, Seasonal Allergies, Sinus Pain, Sore Throat, Visual Disturbances and Yellow Eyes. Respiratory Not Present- Bloody sputum, Chronic Cough, Difficulty Breathing, Snoring and Wheezing. Breast Not Present- Breast Mass, Breast Pain, Nipple Discharge and Skin Changes. Cardiovascular Present- Palpitations and Rapid Heart Rate. Not Present- Chest Pain, Difficulty Breathing Lying Down, Leg Cramps, Shortness of Breath and Swelling of Extremities. Gastrointestinal Present- Constipation and Excessive gas. Not Present- Abdominal Pain, Bloating, Bloody Stool, Change in Bowel Habits, Chronic diarrhea, Difficulty Swallowing, Gets full quickly at meals, Hemorrhoids, Indigestion, Nausea, Rectal Pain and Vomiting. Female Genitourinary Not Present- Frequency, Nocturia, Painful Urination, Pelvic Pain and Urgency. Musculoskeletal Not Present- Back Pain, Joint Pain, Joint Stiffness, Muscle Pain, Muscle Weakness and Swelling of Extremities. Neurological Not Present- Decreased Memory, Fainting, Headaches, Numbness, Seizures,  Tingling, Tremor, Trouble walking and Weakness. Psychiatric Not Present- Anxiety, Bipolar, Change in Sleep Pattern, Depression, Fearful and Frequent crying. Endocrine Present- Cold Intolerance and Hair Changes. Not Present- Excessive Hunger, Heat Intolerance, Hot flashes and New Diabetes. Hematology Not Present- Easy Bruising, Excessive bleeding, Gland  problems, HIV and Persistent Infections.  Vitals Alison Gross CMA; 05/23/2014 4:21 PM) 05/23/2014 4:21 PM Weight: 124.25 lb Height: 65.5in Body Surface Area: 1.61 m Body Mass Index: 20.36 kg/m Temp.: 98.61F(Oral)  Pulse: 68 (Regular)  BP: 122/76 (Sitting, Left Arm, Standard)     Assessment & Plan Alison Hollingshead MD; 05/23/2014 4:54 PM)  HISTORY OF BILATERAL BREAST CANCER (V10.3  Z85.3) Impression: ssessment: Bilateral breast cancers-no clinical evidence of recurrence.  Plan: Return visit November of this year. months. Suspect we could alternate with Dr. Jana Gross so she would be seen every 6 months for the next 3 years  Current Plans Free Text Instructions : discussed with patient and provided information. Follow up in 8 months or as needed  Alison Confer, MD

## 2014-07-11 ENCOUNTER — Other Ambulatory Visit (HOSPITAL_BASED_OUTPATIENT_CLINIC_OR_DEPARTMENT_OTHER): Payer: Commercial Managed Care - HMO

## 2014-07-11 DIAGNOSIS — C50812 Malignant neoplasm of overlapping sites of left female breast: Secondary | ICD-10-CM | POA: Diagnosis not present

## 2014-07-11 DIAGNOSIS — C50319 Malignant neoplasm of lower-inner quadrant of unspecified female breast: Secondary | ICD-10-CM

## 2014-07-11 LAB — COMPREHENSIVE METABOLIC PANEL (CC13)
ALBUMIN: 3.4 g/dL — AB (ref 3.5–5.0)
ALK PHOS: 82 U/L (ref 40–150)
ALT: 31 U/L (ref 0–55)
AST: 30 U/L (ref 5–34)
Anion Gap: 6 mEq/L (ref 3–11)
BUN: 27.8 mg/dL — ABNORMAL HIGH (ref 7.0–26.0)
CO2: 28 mEq/L (ref 22–29)
Calcium: 11.3 mg/dL — ABNORMAL HIGH (ref 8.4–10.4)
Chloride: 108 mEq/L (ref 98–109)
Creatinine: 1.4 mg/dL — ABNORMAL HIGH (ref 0.6–1.1)
EGFR: 41 mL/min/{1.73_m2} — ABNORMAL LOW (ref >90)
GLUCOSE: 119 mg/dL (ref 70–140)
POTASSIUM: 4.1 meq/L (ref 3.5–5.1)
SODIUM: 141 meq/L (ref 136–145)
TOTAL PROTEIN: 6.9 g/dL (ref 6.4–8.3)
Total Bilirubin: 0.78 mg/dL (ref 0.20–1.20)

## 2014-07-11 LAB — CBC WITH DIFFERENTIAL/PLATELET
BASO%: 2.7 % — ABNORMAL HIGH (ref 0.0–2.0)
Basophils Absolute: 0.1 10*3/uL (ref 0.0–0.1)
EOS ABS: 0.4 10*3/uL (ref 0.0–0.5)
EOS%: 9.5 % — AB (ref 0.0–7.0)
HCT: 33.9 % — ABNORMAL LOW (ref 34.8–46.6)
HGB: 11 g/dL — ABNORMAL LOW (ref 11.6–15.9)
LYMPH%: 19.7 % (ref 14.0–49.7)
MCH: 28.9 pg (ref 25.1–34.0)
MCHC: 32.4 g/dL (ref 31.5–36.0)
MCV: 89 fL (ref 79.5–101.0)
MONO#: 0.5 10*3/uL (ref 0.1–0.9)
MONO%: 14.1 % — ABNORMAL HIGH (ref 0.0–14.0)
NEUT%: 54 % (ref 38.4–76.8)
NEUTROS ABS: 2 10*3/uL (ref 1.5–6.5)
Platelets: 280 10*3/uL (ref 145–400)
RBC: 3.81 10*6/uL (ref 3.70–5.45)
RDW: 15.8 % — AB (ref 11.2–14.5)
WBC: 3.7 10*3/uL — ABNORMAL LOW (ref 3.9–10.3)
lymph#: 0.7 10*3/uL — ABNORMAL LOW (ref 0.9–3.3)

## 2014-07-18 ENCOUNTER — Encounter: Payer: Self-pay | Admitting: Nurse Practitioner

## 2014-07-18 ENCOUNTER — Telehealth: Payer: Self-pay | Admitting: Oncology

## 2014-07-18 ENCOUNTER — Other Ambulatory Visit: Payer: Self-pay | Admitting: *Deleted

## 2014-07-18 ENCOUNTER — Ambulatory Visit (HOSPITAL_BASED_OUTPATIENT_CLINIC_OR_DEPARTMENT_OTHER): Payer: Commercial Managed Care - HMO | Admitting: Nurse Practitioner

## 2014-07-18 VITALS — BP 124/58 | HR 76 | Temp 97.8°F | Resp 18 | Ht 66.0 in | Wt 120.3 lb

## 2014-07-18 DIAGNOSIS — D472 Monoclonal gammopathy: Secondary | ICD-10-CM

## 2014-07-18 DIAGNOSIS — Z170 Estrogen receptor positive status [ER+]: Secondary | ICD-10-CM

## 2014-07-18 DIAGNOSIS — R634 Abnormal weight loss: Secondary | ICD-10-CM | POA: Insufficient documentation

## 2014-07-18 DIAGNOSIS — K5900 Constipation, unspecified: Secondary | ICD-10-CM | POA: Diagnosis not present

## 2014-07-18 DIAGNOSIS — C50812 Malignant neoplasm of overlapping sites of left female breast: Secondary | ICD-10-CM | POA: Diagnosis not present

## 2014-07-18 DIAGNOSIS — C50311 Malignant neoplasm of lower-inner quadrant of right female breast: Secondary | ICD-10-CM

## 2014-07-18 DIAGNOSIS — D0512 Intraductal carcinoma in situ of left breast: Secondary | ICD-10-CM

## 2014-07-18 DIAGNOSIS — M810 Age-related osteoporosis without current pathological fracture: Secondary | ICD-10-CM

## 2014-07-18 DIAGNOSIS — N189 Chronic kidney disease, unspecified: Secondary | ICD-10-CM | POA: Diagnosis not present

## 2014-07-18 MED ORDER — ANASTROZOLE 1 MG PO TABS: ORAL_TABLET | ORAL | Status: DC

## 2014-07-18 NOTE — Telephone Encounter (Signed)
Appointments made and avs printed for pateint °
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
## 2014-07-26 ENCOUNTER — Emergency Department (HOSPITAL_BASED_OUTPATIENT_CLINIC_OR_DEPARTMENT_OTHER): Payer: Commercial Managed Care - HMO

## 2014-07-26 ENCOUNTER — Inpatient Hospital Stay (HOSPITAL_BASED_OUTPATIENT_CLINIC_OR_DEPARTMENT_OTHER)
Admission: EM | Admit: 2014-07-26 | Discharge: 2014-07-28 | DRG: 291 | Disposition: A | Discharge status: Home / Self Care | Payer: Commercial Managed Care - HMO | Attending: Internal Medicine | Admitting: Internal Medicine

## 2014-07-26 ENCOUNTER — Other Ambulatory Visit (HOSPITAL_BASED_OUTPATIENT_CLINIC_OR_DEPARTMENT_OTHER): Payer: Self-pay

## 2014-07-26 ENCOUNTER — Encounter (HOSPITAL_BASED_OUTPATIENT_CLINIC_OR_DEPARTMENT_OTHER): Payer: Self-pay | Admitting: Emergency Medicine

## 2014-07-26 ENCOUNTER — Encounter: Payer: Self-pay | Admitting: Skilled Nursing Facility1

## 2014-07-26 DIAGNOSIS — E43 Unspecified severe protein-calorie malnutrition: Secondary | ICD-10-CM

## 2014-07-26 DIAGNOSIS — Z87891 Personal history of nicotine dependence: Secondary | ICD-10-CM | POA: Diagnosis not present

## 2014-07-26 DIAGNOSIS — I5033 Acute on chronic diastolic (congestive) heart failure: Secondary | ICD-10-CM | POA: Diagnosis not present

## 2014-07-26 DIAGNOSIS — Z9013 Acquired absence of bilateral breasts and nipples: Secondary | ICD-10-CM | POA: Diagnosis present

## 2014-07-26 DIAGNOSIS — E785 Hyperlipidemia, unspecified: Secondary | ICD-10-CM | POA: Diagnosis present

## 2014-07-26 DIAGNOSIS — M179 Osteoarthritis of knee, unspecified: Secondary | ICD-10-CM | POA: Diagnosis present

## 2014-07-26 DIAGNOSIS — I129 Hypertensive chronic kidney disease with stage 1 through stage 4 chronic kidney disease, or unspecified chronic kidney disease: Secondary | ICD-10-CM | POA: Diagnosis present

## 2014-07-26 DIAGNOSIS — N183 Chronic kidney disease, stage 3 unspecified: Secondary | ICD-10-CM | POA: Diagnosis present

## 2014-07-26 DIAGNOSIS — Z853 Personal history of malignant neoplasm of breast: Secondary | ICD-10-CM

## 2014-07-26 DIAGNOSIS — I480 Paroxysmal atrial fibrillation: Secondary | ICD-10-CM | POA: Diagnosis present

## 2014-07-26 DIAGNOSIS — K219 Gastro-esophageal reflux disease without esophagitis: Secondary | ICD-10-CM | POA: Diagnosis present

## 2014-07-26 DIAGNOSIS — C50919 Malignant neoplasm of unspecified site of unspecified female breast: Secondary | ICD-10-CM

## 2014-07-26 DIAGNOSIS — Z923 Personal history of irradiation: Secondary | ICD-10-CM | POA: Diagnosis not present

## 2014-07-26 DIAGNOSIS — E440 Moderate protein-calorie malnutrition: Secondary | ICD-10-CM | POA: Insufficient documentation

## 2014-07-26 DIAGNOSIS — I272 Other secondary pulmonary hypertension: Secondary | ICD-10-CM | POA: Diagnosis present

## 2014-07-26 DIAGNOSIS — E1129 Type 2 diabetes mellitus with other diabetic kidney complication: Secondary | ICD-10-CM

## 2014-07-26 DIAGNOSIS — Z9119 Patient's noncompliance with other medical treatment and regimen: Secondary | ICD-10-CM | POA: Diagnosis present

## 2014-07-26 DIAGNOSIS — E1122 Type 2 diabetes mellitus with diabetic chronic kidney disease: Secondary | ICD-10-CM | POA: Diagnosis present

## 2014-07-26 DIAGNOSIS — E780 Pure hypercholesterolemia: Secondary | ICD-10-CM | POA: Diagnosis present

## 2014-07-26 DIAGNOSIS — R0602 Shortness of breath: Secondary | ICD-10-CM | POA: Diagnosis not present

## 2014-07-26 DIAGNOSIS — E049 Nontoxic goiter, unspecified: Secondary | ICD-10-CM

## 2014-07-26 DIAGNOSIS — M1710 Unilateral primary osteoarthritis, unspecified knee: Secondary | ICD-10-CM | POA: Diagnosis present

## 2014-07-26 DIAGNOSIS — R778 Other specified abnormalities of plasma proteins: Secondary | ICD-10-CM | POA: Diagnosis present

## 2014-07-26 DIAGNOSIS — R634 Abnormal weight loss: Secondary | ICD-10-CM

## 2014-07-26 DIAGNOSIS — Z90710 Acquired absence of both cervix and uterus: Secondary | ICD-10-CM

## 2014-07-26 DIAGNOSIS — R0609 Other forms of dyspnea: Secondary | ICD-10-CM

## 2014-07-26 DIAGNOSIS — Z8711 Personal history of peptic ulcer disease: Secondary | ICD-10-CM

## 2014-07-26 DIAGNOSIS — Z86010 Personal history of colonic polyps: Secondary | ICD-10-CM | POA: Diagnosis not present

## 2014-07-26 DIAGNOSIS — C50311 Malignant neoplasm of lower-inner quadrant of right female breast: Secondary | ICD-10-CM | POA: Diagnosis present

## 2014-07-26 DIAGNOSIS — E042 Nontoxic multinodular goiter: Secondary | ICD-10-CM | POA: Diagnosis present

## 2014-07-26 DIAGNOSIS — Z7901 Long term (current) use of anticoagulants: Secondary | ICD-10-CM

## 2014-07-26 DIAGNOSIS — C50911 Malignant neoplasm of unspecified site of right female breast: Secondary | ICD-10-CM

## 2014-07-26 DIAGNOSIS — R7989 Other specified abnormal findings of blood chemistry: Secondary | ICD-10-CM

## 2014-07-26 DIAGNOSIS — Z681 Body mass index (BMI) 19 or less, adult: Secondary | ICD-10-CM | POA: Diagnosis not present

## 2014-07-26 DIAGNOSIS — D472 Monoclonal gammopathy: Secondary | ICD-10-CM | POA: Diagnosis present

## 2014-07-26 DIAGNOSIS — J90 Pleural effusion, not elsewhere classified: Secondary | ICD-10-CM | POA: Diagnosis present

## 2014-07-26 DIAGNOSIS — I10 Essential (primary) hypertension: Secondary | ICD-10-CM | POA: Diagnosis not present

## 2014-07-26 DIAGNOSIS — N189 Chronic kidney disease, unspecified: Secondary | ICD-10-CM

## 2014-07-26 HISTORY — DX: Shortness of breath: R06.02

## 2014-07-26 HISTORY — DX: Gastrointestinal hemorrhage, unspecified: K92.2

## 2014-07-26 HISTORY — DX: Chronic kidney disease, stage 3 (moderate): N18.3

## 2014-07-26 HISTORY — DX: Type 2 diabetes mellitus with other diabetic kidney complication: E11.29

## 2014-07-26 HISTORY — DX: Age-related osteoporosis without current pathological fracture: M81.0

## 2014-07-26 HISTORY — DX: Pleural effusion, not elsewhere classified: J90

## 2014-07-26 HISTORY — DX: Chronic kidney disease, stage 3 unspecified: N18.30

## 2014-07-26 HISTORY — DX: Nontoxic goiter, unspecified: E04.9

## 2014-07-26 HISTORY — DX: Other specified symptoms and signs involving the circulatory and respiratory systems: R09.89

## 2014-07-26 HISTORY — DX: Monoclonal gammopathy: D47.2

## 2014-07-26 LAB — BASIC METABOLIC PANEL
Anion gap: 8 (ref 5–15)
BUN: 31 mg/dL — ABNORMAL HIGH (ref 6–20)
CALCIUM: 11.2 mg/dL — AB (ref 8.9–10.3)
CO2: 30 mmol/L (ref 22–32)
Chloride: 102 mmol/L (ref 101–111)
Creatinine, Ser: 1.45 mg/dL — ABNORMAL HIGH (ref 0.44–1.00)
GFR calc Af Amer: 39 mL/min — ABNORMAL LOW (ref >60)
GFR calc non Af Amer: 33 mL/min — ABNORMAL LOW (ref >60)
GLUCOSE: 96 mg/dL (ref 65–99)
Potassium: 3.8 mmol/L (ref 3.5–5.1)
SODIUM: 140 mmol/L (ref 135–145)

## 2014-07-26 LAB — CBC WITH DIFFERENTIAL/PLATELET
BASOS ABS: 0 10*3/uL (ref 0.0–0.1)
Basophils Relative: 1 % (ref 0–1)
EOS ABS: 0.3 10*3/uL (ref 0.0–0.7)
EOS PCT: 8 % — AB (ref 0–5)
HCT: 34.3 % — ABNORMAL LOW (ref 36.0–46.0)
Hemoglobin: 11 g/dL — ABNORMAL LOW (ref 12.0–15.0)
Lymphocytes Relative: 14 % (ref 12–46)
Lymphs Abs: 0.6 10*3/uL — ABNORMAL LOW (ref 0.7–4.0)
MCH: 28 pg (ref 26.0–34.0)
MCHC: 32.1 g/dL (ref 30.0–36.0)
MCV: 87.3 fL (ref 78.0–100.0)
Monocytes Absolute: 0.5 10*3/uL (ref 0.1–1.0)
Monocytes Relative: 13 % — ABNORMAL HIGH (ref 3–12)
NEUTROS PCT: 64 % (ref 43–77)
Neutro Abs: 2.5 10*3/uL (ref 1.7–7.7)
Platelets: 302 10*3/uL (ref 150–400)
RBC: 3.93 MIL/uL (ref 3.87–5.11)
RDW: 15.2 % (ref 11.5–15.5)
WBC: 3.9 10*3/uL — AB (ref 4.0–10.5)

## 2014-07-26 LAB — GLUCOSE, CAPILLARY
GLUCOSE-CAPILLARY: 103 mg/dL — AB (ref 65–99)
Glucose-Capillary: 126 mg/dL — ABNORMAL HIGH (ref 65–99)

## 2014-07-26 LAB — HEPATIC FUNCTION PANEL
ALK PHOS: 85 U/L (ref 38–126)
ALT: 33 U/L (ref 14–54)
AST: 38 U/L (ref 15–41)
Albumin: 3.5 g/dL (ref 3.5–5.0)
BILIRUBIN DIRECT: 0.2 mg/dL (ref 0.1–0.5)
BILIRUBIN INDIRECT: 0.7 mg/dL (ref 0.3–0.9)
BILIRUBIN TOTAL: 0.9 mg/dL (ref 0.3–1.2)
Total Protein: 7.1 g/dL (ref 6.5–8.1)

## 2014-07-26 LAB — TROPONIN I
TROPONIN I: 0.2 ng/mL — AB (ref <0.031)
Troponin I: 0.2 ng/mL — ABNORMAL HIGH (ref <0.031)
Troponin I: 0.18 ng/mL — ABNORMAL HIGH (ref <0.031)

## 2014-07-26 LAB — MRSA PCR SCREENING: MRSA BY PCR: NEGATIVE

## 2014-07-26 LAB — BRAIN NATRIURETIC PEPTIDE: B Natriuretic Peptide: 1007.4 pg/mL — ABNORMAL HIGH (ref 0.0–100.0)

## 2014-07-26 MED ORDER — ATORVASTATIN CALCIUM 40 MG PO TABS
40.0000 mg | ORAL_TABLET | Freq: Every day | ORAL | Status: DC
  Administered 2014-07-26 – 2014-07-27 (×2): 40 mg via ORAL
  Filled 2014-07-26 (×3): qty 1

## 2014-07-26 MED ORDER — SODIUM CHLORIDE 0.9 % IJ SOLN: 3.0000 mL | INTRAMUSCULAR | Status: DC | PRN

## 2014-07-26 MED ORDER — HYDROCHLOROTHIAZIDE 12.5 MG PO CAPS
12.5000 mg | ORAL_CAPSULE | Freq: Every day | ORAL | Status: DC
  Filled 2014-07-26: qty 1

## 2014-07-26 MED ORDER — POTASSIUM CHLORIDE CRYS ER 20 MEQ PO TBCR
20.0000 meq | EXTENDED_RELEASE_TABLET | Freq: Two times a day (BID) | ORAL | Status: DC
  Administered 2014-07-26 – 2014-07-28 (×4): 20 meq via ORAL
  Filled 2014-07-26 (×4): qty 1

## 2014-07-26 MED ORDER — FUROSEMIDE 10 MG/ML IJ SOLN
40.0000 mg | Freq: Two times a day (BID) | INTRAMUSCULAR | Status: DC
Start: 2014-07-26 — End: 2014-07-27
  Administered 2014-07-26: 40 mg via INTRAVENOUS
  Filled 2014-07-26: qty 4

## 2014-07-26 MED ORDER — SODIUM CHLORIDE 0.9 % IV SOLN: 250.0000 mL | INTRAVENOUS | Status: DC | PRN

## 2014-07-26 MED ORDER — LOSARTAN POTASSIUM 50 MG PO TABS
100.0000 mg | ORAL_TABLET | Freq: Every day | ORAL | Status: DC
  Administered 2014-07-28: 100 mg via ORAL
  Filled 2014-07-26 (×2): qty 2

## 2014-07-26 MED ORDER — INSULIN ASPART 100 UNIT/ML ~~LOC~~ SOLN: 0.0000 [IU] | Freq: Every day | SUBCUTANEOUS | Status: DC

## 2014-07-26 MED ORDER — POLYETHYLENE GLYCOL 3350 17 G PO PACK: 17.0000 g | PACK | Freq: Every day | ORAL | Status: DC | PRN

## 2014-07-26 MED ORDER — VITAMIN D3 25 MCG (1000 UNIT) PO TABS
2000.0000 [IU] | ORAL_TABLET | ORAL | Status: DC
  Administered 2014-07-26 – 2014-07-28 (×2): 2000 [IU] via ORAL
  Filled 2014-07-26 (×2): qty 2

## 2014-07-26 MED ORDER — RIVAROXABAN 15 MG PO TABS
15.0000 mg | ORAL_TABLET | Freq: Every day | ORAL | Status: DC
  Administered 2014-07-26: 15 mg via ORAL
  Filled 2014-07-26: qty 1

## 2014-07-26 MED ORDER — SODIUM CHLORIDE 0.9 % IJ SOLN
3.0000 mL | Freq: Two times a day (BID) | INTRAMUSCULAR | Status: DC
  Administered 2014-07-27: 3 mL via INTRAVENOUS

## 2014-07-26 MED ORDER — ONDANSETRON HCL 4 MG/2ML IJ SOLN
4.0000 mg | Freq: Four times a day (QID) | INTRAMUSCULAR | Status: DC | PRN
Start: 2014-07-26 — End: 2014-07-28

## 2014-07-26 MED ORDER — ACETAMINOPHEN 650 MG RE SUPP: 650.0000 mg | Freq: Four times a day (QID) | RECTAL | Status: DC | PRN

## 2014-07-26 MED ORDER — METOPROLOL TARTRATE 50 MG PO TABS
50.0000 mg | ORAL_TABLET | Freq: Two times a day (BID) | ORAL | Status: DC
  Administered 2014-07-26 – 2014-07-28 (×3): 50 mg via ORAL
  Filled 2014-07-26: qty 2
  Filled 2014-07-26 (×2): qty 1

## 2014-07-26 MED ORDER — ASPIRIN EC 81 MG PO TBEC
81.0000 mg | DELAYED_RELEASE_TABLET | Freq: Every day | ORAL | Status: DC
  Administered 2014-07-26 – 2014-07-28 (×3): 81 mg via ORAL
  Filled 2014-07-26 (×3): qty 1

## 2014-07-26 MED ORDER — ACETAMINOPHEN 325 MG PO TABS: 650.0000 mg | ORAL_TABLET | Freq: Four times a day (QID) | ORAL | Status: DC | PRN

## 2014-07-26 MED ORDER — ONDANSETRON HCL 4 MG PO TABS: 4.0000 mg | ORAL_TABLET | Freq: Four times a day (QID) | ORAL | Status: DC | PRN

## 2014-07-26 MED ORDER — FUROSEMIDE 10 MG/ML IJ SOLN
40.0000 mg | Freq: Once | INTRAMUSCULAR | Status: AC
  Administered 2014-07-26: 40 mg via INTRAVENOUS
  Filled 2014-07-26: qty 4

## 2014-07-26 MED ORDER — INSULIN ASPART 100 UNIT/ML ~~LOC~~ SOLN
0.0000 [IU] | Freq: Three times a day (TID) | SUBCUTANEOUS | Status: DC
  Administered 2014-07-26 – 2014-07-27 (×2): 1 [IU] via SUBCUTANEOUS

## 2014-07-26 MED ORDER — ROSUVASTATIN CALCIUM 20 MG PO TABS: 20.0000 mg | ORAL_TABLET | Freq: Every evening | ORAL | Status: DC

## 2014-07-26 MED ORDER — ADULT MULTIVITAMIN W/MINERALS CH
1.0000 | ORAL_TABLET | ORAL | Status: DC
  Administered 2014-07-27: 1 via ORAL
  Filled 2014-07-26: qty 1

## 2014-07-26 MED ORDER — ENSURE ENLIVE PO LIQD
237.0000 mL | Freq: Two times a day (BID) | ORAL | Status: DC
  Administered 2014-07-27 – 2014-07-28 (×3): 237 mL via ORAL

## 2014-07-26 MED ORDER — SODIUM CHLORIDE 0.9 % IJ SOLN
3.0000 mL | Freq: Two times a day (BID) | INTRAMUSCULAR | Status: DC
  Administered 2014-07-26 – 2014-07-28 (×3): 3 mL via INTRAVENOUS

## 2014-07-26 MED ORDER — ANASTROZOLE 1 MG PO TABS
1.0000 mg | ORAL_TABLET | Freq: Every day | ORAL | Status: DC
  Administered 2014-07-27 – 2014-07-28 (×2): 1 mg via ORAL
  Filled 2014-07-26 (×3): qty 1

## 2014-07-26 MED ORDER — LOSARTAN POTASSIUM-HCTZ 100-12.5 MG PO TABS: 1.0000 | ORAL_TABLET | Freq: Every day | ORAL | Status: DC

## 2014-07-26 NOTE — ED Provider Notes (Addendum)
CSN: [id number]     Arrival date & time 07/26/14  0901 History   First MD Initiated Contact with Patient 07/26/14 1006     Chief Complaint  Patient presents with  . Shortness of Breath     (Consider location/radiation/quality/duration/timing/severity/associated sxs/prior Treatment) HPI Comments: Patient is a 81 year old female with history of breast cancer status post mastectomy and chemotherapy approximately 3 years ago. She also experienced a right-sided pleural effusion which required thoracentesis. From what I can tell in the chart, cytology was negative. She presents today for evaluation of dyspnea on exertion which has worsened over the past month. She denies any fevers or chills. She denies any productive cough. She denies any chest discomfort.  Patient is a 81 y.o. female presenting with shortness of breath. The history is provided by the patient.  Shortness of Breath Severity:  Moderate Onset quality:  Gradual Duration:  1 month Timing:  Constant Progression:  Worsening Chronicity:  Recurrent Context: activity   Relieved by:  Rest Worsened by:  Exertion Ineffective treatments:  None tried Associated symptoms: no chest pain, no cough and no fever     Past Medical History  Diagnosis Date  . Hypercholesterolemia     takes Crestor daily  . Thyroid disease     had iodine radiation  . Hypertension     takes Hyzaar daily  . Dysrhythmia     takes Carvedilol daily  . Pneumonia     history of;last time about 4-68yr ago  . GERD (gastroesophageal reflux disease)     takes Protonix daily  . Gastric ulcer   . History of blood transfusion   . History of colon polyps   . Anemia     takes iron pill daily  . Diabetes mellitus without complication     takes Tradjenta daily  . History of radiation therapy 07/12/12-08/26/12    right breast/  . Paroxysmal atrial fibrillation     PCP EKG 09/27/2013: A. Fibrillation.. EKG 09/29/2013: S. Tach.  . Breast cancer 04/12/12    right-pos  lymph node/left-DCIS  . Shortness of breath on exertion   . Bruit of left carotid artery    Past Surgical History  Procedure Laterality Date  . Carpal tunnel release      left  . Breast biopsy    . Esophagogastroduodenoscopy  01/24/2012    Procedure: ESOPHAGOGASTRODUODENOSCOPY (EGD);  Surgeon: MJeryl Columbia MD;  [facility];  Service: Endoscopy;  Laterality: N/A;  . Thyroid goiter removed    . Cyst removed from back of neck    . Knee surgery      right with rods  . Esophagogastroduodenoscopy    . Colonoscopy    . Total mastectomy Bilateral 05/10/2012    Procedure: RIGHT modified mastectomy; LEFT total mastectomy;  Surgeon: CHaywood Lasso MD;  [facility] [facility]  Service: General;  Laterality: Bilateral;  . Axillary sentinel node biopsy Right 05/10/2012    Procedure: AXILLARY SENTINEL lymph  NODE BIOPSY;  Surgeon: CHaywood Lasso MD;  [place] [facility]  Service: General;  Laterality: Right;  nuclear medicine injection right side  7:00 am   . Abdominal hysterectomy      fibroids, with bilateral SO   Family History  Problem Relation Age of Onset  . Brain cancer Mother   . Aneurysm Father    History  Substance Use Topics  . Smoking status: Former Smoker -- 0.00 packs/day    Types: Cigarettes    Quit date: 07/26/1971  .  Smokeless tobacco: Never Used  . Alcohol Use: No   OB History    No data available     Review of Systems  Constitutional: Negative for fever.  Respiratory: Positive for shortness of breath. Negative for cough.   Cardiovascular: Negative for chest pain.  All other systems reviewed and are negative.     Allergies  Review of patient's allergies indicates no known allergies.  Home Medications   Prior to Admission medications   Medication Sig Start Date End Date Taking? Authorizing Provider  ACCU-CHEK AVIVA PLUS test strip  06/16/13   Historical Provider, MD  ACCU-CHEK SOFTCLIX LANCETS lancets  06/16/13   Historical Provider, MD  anastrozole  (ARIMIDEX) 1 MG tablet TAKE 1 TABLET (1 MG TOTAL) BY MOUTH DAILY. 07/18/14   Laurie Panda, NP  Cholecalciferol (VITAMIN D) 2000 UNITS CAPS Take 2,000 Units by mouth daily.     Historical Provider, MD  Iron-FA-B Cmp-C-Biot-Probiotic (FUSION PLUS) CAPS Take 1 tablet by mouth daily.    Historical Provider, MD  linagliptin (TRADJENTA) 5 MG TABS tablet Take 5 mg by mouth daily.    Historical Provider, MD  losartan-hydrochlorothiazide (HYZAAR) 100-12.5 MG per tablet Take 1 tablet by mouth daily.    Historical Provider, MD  metoprolol (LOPRESSOR) 50 MG tablet Take 50 mg by mouth 2 (two) times daily. 01/05/14   Historical Provider, MD  Multiple Vitamin (MULTIVITAMIN WITH MINERALS) TABS Take 1 tablet by mouth daily.    Historical Provider, MD  rosuvastatin (CRESTOR) 20 MG tablet Take 20 mg by mouth every evening.    Historical Provider, MD  XARELTO 20 MG TABS tablet Take 20 mg by mouth daily with supper.  01/01/14   Historical Provider, MD   BP 132/62 mmHg  Pulse 77  Temp(Src) 97.5 F (36.4 C) (Oral)  SpO2 97% Physical Exam  Constitutional: She is oriented to person, place, and time. She appears well-developed and well-nourished. No distress.  HENT:  Head: Normocephalic and atraumatic.  Mouth/Throat: Oropharynx is clear and moist.  Neck: Normal range of motion. Neck supple.  Cardiovascular: Normal rate and regular rhythm.  Exam reveals no gallop and no friction rub.   No murmur heard. Pulmonary/Chest: Effort normal. No respiratory distress. She has no wheezes. She has no rales.  Breath sounds are diminished on the right.  Abdominal: Soft. Bowel sounds are normal. She exhibits no distension. There is no tenderness.  Musculoskeletal: Normal range of motion. She exhibits no edema.  Neurological: She is alert and oriented to person, place, and time.  Skin: Skin is warm and dry. She is not diaphoretic.  Nursing note and vitals reviewed.   ED Course  Procedures (including critical care  time) Labs Review Labs Reviewed  CBC WITH DIFFERENTIAL/PLATELET  BASIC METABOLIC PANEL  BRAIN NATRIURETIC PEPTIDE  TROPONIN I    Imaging Review Dg Chest 2 View  07/26/2014   CLINICAL DATA:  Shortness of breath and chest tightness for several months. History of breast cancer and pleural effusion.  EXAM: CHEST  2 VIEW  COMPARISON:  11/16/2013 and CT chest 10/28/2013.  FINDINGS: Trachea is deviated to the left by a known large goiter. Heart size is grossly within normal limits. Moderate right pleural effusion with collapse/ consolidation in the adjacent right lung. Tiny left pleural effusion. Postoperative changes from bilateral mastectomies.  IMPRESSION: 1. Moderate right pleural effusion with collapse/consolidation in the right lower lobe. 2. Tiny left pleural effusion. 3. Large goiter.   Electronically Signed   By: Lorin Picket M.D.  On: 07/26/2014 10:15    ED ECG REPORT   Date: 07/26/2014  Rate: 74  Rhythm: normal sinus rhythm  QRS Axis: left  Intervals: normal  ST/T Wave abnormalities: nonspecific T wave changes  Conduction Disutrbances:none  Narrative Interpretation:   Old EKG Reviewed: T wave changes appear new  I have personally reviewed the EKG tracing and agree with the computerized printout as noted.   MDM   Final diagnoses:  None    Patient presents here with dyspnea on exertion that appears to be related to a right-sided pleural effusion. She has a history of breast cancer and also workup reveals elevated BNP, mildly positive troponin, concerning for congestive heart failure. She has had a negative thoracentesis in the past and may well require this again.  I have discussed this case with Dr. Alvy Bimler from oncology who is concerned about her elevated calcium being related to a possible malignant effusion. I've discussed the case with Dr. Darrick Meigs from the hospitalist service who agrees to accept the patient in transfer, however he would like to involve cardiology due to  the troponin, BNP, and possible new EKG changes.  I have spoken with cardiology, Dr. Tarri Glenn who does not recommend initiating any anticoagulation or other therapy at this time. They will consult on the patient when she arrives to [facility].  CRITICAL CARE Performed by: Veryl Speak Total critical care time: 30 minutes Critical care time was exclusive of separately billable procedures and treating other patients. Critical care was necessary to treat or prevent imminent or life-threatening deterioration. Critical care was time spent personally by me on the following activities: development of treatment plan with patient and/or surrogate as well as nursing, discussions with consultants, evaluation of patient's response to treatment, examination of patient, obtaining history from patient or surrogate, ordering and performing treatments and interventions, ordering and review of laboratory studies, ordering and review of radiographic studies, pulse oximetry and re-evaluation of patient's condition.    Veryl Speak, MD 07/26/14 [place] [facility], MD 07/26/14 [phone]

## 2014-07-26 NOTE — Progress Notes (Signed)
Patient transferred to 1406 from ICU. Pt arrived in stable condition. Pt reported having no pain, she is alert and oriented X 4, vital signs are stable. This Probation officer  agrees with the previous RN's assessment. Will continue to montior.  Syd Newsome, RN

## 2014-07-26 NOTE — Progress Notes (Signed)
Subjective:     Patient ID: Alison Gross, female   DOB: Nov 16, 1932, 81 y.o.   MRN: [id number]  HPI   Review of Systems     Objective:   Physical Exam To assist the pt in identifying some dietary strategies for gaining some lost wt back.    Assessment:     Pt identified as being malnourished due to lost wt. Pt contacted via the telephone at [phone]. [name]) answered and stated she has been admitted to the hospital from an emergency situation and will be transferred to [facility].    Plan:     Dietitian offered Alison Gross's name and number if she wanted to contact a dietitian when she was out of the hospital.

## 2014-07-26 NOTE — H&P (Addendum)
History and Physical:    Alison Gross   UMP:[id number] DOB: 02-06-1933 DOA: 07/26/2014  Referring physician: Dr. Veryl Speak PCP: Maximino Greenland, MD  Cardiologist: Dr. Nadyne Coombes  Chief Complaint: Shortness of breath and chest tightness  History of Present Illness:   Alison Gross is an 81 y.o. female with a PMH of PAF on Xarelto, breast cancer s/p mastectomy/chemotherapy, right sided pleural effusion requiring thoracentesis 11/2013 (negative cytology), who has had progressive moderate to severe dyspnea/DOE over the past month.  The patient says that her symptoms actually began back in January, mainly with DOE and chest tightness, and have progressed to become more pronounced with limited activity.  Rest relieves the the symptoms.  No dyspnea or chest tightness at rest.  She also has noticed worsening lower extremity swelling over the past week.  No other associated signs or symptoms. I spoke with Dr. Nadyne Coombes who reports that she had a normal nuclear stress test and an  Echo 10/2013 which showed mod hypertrophy, speckled pattern, EF 62%, grade II diastolic dysfunction and moderate pulmonary hypertension.  ROS:   Constitutional: No fever, no chills;  Appetite diminished; + 50 lb weight loss, no weight gain, + fatigue.  HEENT: No blurry vision, no diplopia, no pharyngitis, no dysphagia, + post nasal drip CV: + chest pain, + palpitations, no PND, + 2 pillow orthopnea, + edema.  Resp: + SOB, + cough, no pleuritic pain. GI: No nausea, no vomiting, no diarrhea, no melena, no hematochezia, + constipation, no abdominal pain.  GU: No dysuria, no hematuria, no frequency, no urgency. MSK: no myalgias, no arthralgias.  Neuro:  No headache, no focal neurological deficits, no history of seizures.  Psych: No depression, no anxiety.  Endo: No heat intolerance, + cold intolerance, no polyuria, no polydipsia  Skin: No rashes, no skin lesions.  Heme: No easy bruising.  Travel history: No recent travel except  [place]   Past Medical History:   Past Medical History  Diagnosis Date  . Hypercholesterolemia     takes Crestor daily  . Thyroid disease     had iodine radiation  . Hypertension     takes Hyzaar daily  . Dysrhythmia     takes Carvedilol daily  . Pneumonia     history of;last time about 4-36yr ago  . GERD (gastroesophageal reflux disease)     takes Protonix daily  . Gastric ulcer   . History of blood transfusion   . History of colon polyps   . Anemia     takes iron pill daily  . Diabetes mellitus without complication     takes Tradjenta daily  . History of radiation therapy 07/12/12-08/26/12    right breast/  . Paroxysmal atrial fibrillation     PCP EKG 09/27/2013: A. Fibrillation.. EKG 09/29/2013: S. Tach.  . Breast cancer 04/12/12    right-pos lymph node/left-DCIS  . Shortness of breath on exertion   . Bruit of left carotid artery   . Acute upper GI bleeding 01/24/2012  . MGUS (monoclonal gammopathy of unknown significance) 04/21/2013  . Osteoporosis 11/29/2013  . Recurrent right pleural effusion 07/26/2014  . Stage III chronic kidney disease 07/26/2014    Past Surgical History:   Past Surgical History  Procedure Laterality Date  . Carpal tunnel release      left  . Breast biopsy    . Esophagogastroduodenoscopy  01/24/2012    Procedure: ESOPHAGOGASTRODUODENOSCOPY (EGD);  Surgeon: MJeryl Columbia MD;  [facility];  Service: Endoscopy;  Laterality: N/A;  . Thyroid goiter removed    . Cyst removed from back of neck    . Knee surgery      right with rods  . Esophagogastroduodenoscopy    . Colonoscopy    . Total mastectomy Bilateral 05/10/2012    Procedure: RIGHT modified mastectomy; LEFT total mastectomy;  Surgeon: [name], MD;  Location: [facility];  Service: General;  Laterality: Bilateral;  . Axillary sentinel node biopsy Right 05/10/2012    Procedure: AXILLARY SENTINEL lymph  NODE BIOPSY;  Surgeon: [name], MD;  Location: [facility];  Service:  General;  Laterality: Right;  nuclear medicine injection right side  7:00 am   . Abdominal hysterectomy      fibroids, with bilateral SO    Social History:   History   Social History  . Marital Status: Married    Spouse Name: Alison Gross  . Number of Children: 2  . Years of Education: N/A   Occupational History  . Not on file.   Social History Main Topics  . Smoking status: Former Smoker -- 0.00 packs/day    Types: Cigarettes    Quit date: 07/26/1971  . Smokeless tobacco: Never Used  . Alcohol Use: No  . Drug Use: No  . Sexual Activity: Not Currently    Birth Control/ Protection: Surgical     Comment: menarche age 19,1st live birth 36, P2, no HRT   Other Topics Concern  . Not on file   Social History Narrative   Married.  Ambulates independently.      Family history:   Family History  Problem Relation Age of Onset  . Brain cancer Mother   . Aneurysm Father   . Diabetes Sister   . Diabetes Brother   . Diabetes Sister   . Diabetes Brother   . Heart failure Sister     Allergies   Review of patient's allergies indicates no known allergies.  Current Medications:   Prior to Admission medications   Medication Sig Start Date End Date Taking? Authorizing Provider  anastrozole (ARIMIDEX) 1 MG tablet TAKE 1 TABLET (1 MG TOTAL) BY MOUTH DAILY. Patient taking differently: Take 1 mg by mouth daily with breakfast.  07/18/14  Yes Laurie Panda, NP  Cholecalciferol (VITAMIN D) 2000 UNITS CAPS Take 2,000 Units by mouth every other day.    Yes Historical Provider, MD  linagliptin (TRADJENTA) 5 MG TABS tablet Take 5 mg by mouth daily.   Yes Historical Provider, MD  losartan-hydrochlorothiazide (HYZAAR) 100-12.5 MG per tablet Take 1 tablet by mouth daily.   Yes Historical Provider, MD  metoprolol (LOPRESSOR) 50 MG tablet Take 50 mg by mouth 2 (two) times daily. 01/05/14  Yes Historical Provider, MD  Multiple Vitamin (MULTIVITAMIN WITH MINERALS) TABS Take 1 tablet by mouth every  other day.    Yes Historical Provider, MD  XARELTO 20 MG TABS tablet Take 20 mg by mouth at bedtime.  01/01/14  Yes Historical Provider, MD  rosuvastatin (CRESTOR) 20 MG tablet Take 20 mg by mouth every evening.    Historical Provider, MD    Physical Exam:   Filed Vitals:   07/26/14 1315 07/26/14 1400 07/26/14 1500 07/26/14 1600  BP:  140/67 139/77 143/51  Pulse:    113  Temp:  98.6 F (37 C)    TempSrc:      Resp:  '14 19 19  '$ Height: '5\' 5"'$  (1.651 m)     Weight: 54 kg (119 lb 0.8 oz)  SpO2:  97% 98% 93%     Physical Exam: Blood pressure 143/51, pulse 113, temperature 98.6 F (37 C), temperature source Oral, resp. rate 19, height '5\' 5"'$  (1.651 m), weight 54 kg (119 lb 0.8 oz), SpO2 93 %. Gen: No acute distress. Head: Normocephalic, atraumatic. Eyes: PERRL, EOMI, sclerae nonicteric. Mouth: Oropharynx clear. Neck: Supple, + thyromegaly, no lymphadenopathy, + jugular venous distention. Chest: Lungs with a few bibasilar crackles. CV: Heart sounds are regular. No appreciable murmurs, rubs, or gallops. Abdomen: Soft, nontender, nondistended with normal active bowel sounds. Extremities: Extremities are with trace edema. Skin: Warm and dry. Neuro: Alert and oriented times 3; grossly nonfocal. Psych: Mood and affect normal.   Data Review:    Labs: Basic Metabolic Panel:  Recent Labs Lab 07/26/14 1010  NA 140  K 3.8  CL 102  CO2 30  GLUCOSE 96  BUN 31*  CREATININE 1.45*  CALCIUM 11.2*   CBC:  Recent Labs Lab 07/26/14 1010  WBC 3.9*  NEUTROABS 2.5  HGB 11.0*  HCT 34.3*  MCV 87.3  PLT 302   Cardiac Enzymes:  Recent Labs Lab 07/26/14 1010  TROPONINI 0.20*    Radiographic Studies: Dg Chest 2 View  07/26/2014   CLINICAL DATA:  Shortness of breath and chest tightness for several months. History of breast cancer and pleural effusion.  EXAM: CHEST  2 VIEW  COMPARISON:  11/16/2013 and CT chest 10/28/2013.  FINDINGS: Trachea is deviated to the left by a known  large goiter. Heart size is grossly within normal limits. Moderate right pleural effusion with collapse/ consolidation in the adjacent right lung. Tiny left pleural effusion. Postoperative changes from bilateral mastectomies.  IMPRESSION: 1. Moderate right pleural effusion with collapse/consolidation in the right lower lobe. 2. Tiny left pleural effusion. 3. Large goiter.   Electronically Signed   By: Lorin Picket M.D.   On: 07/26/2014 10:15   *I have personally reviewed the images above*  EKG: Ordered and is pending at this time.   Assessment/Plan:   Principal Problem:   Acute on chronic diastolic heart failure - Known history of grade 2 diastolic dysfunction, concentric hypertrophy and pulmonary hypertension. - Elevated BNP and chest radiograph findings consistent with acute decompensation. - Strict I/O, daily weights. Diurese with Lasix 40 mg IV twice a day. - Repeat 2-D echocardiogram. - Cycle troponins. If significant elevation, formally consult Dr. Nadyne Coombes. - Normal stress test 10/2013.  Active Problems:   Hypertension - Continue Hyzaar and metoprolol.    Dyslipidemia - Has not been compliant with Crestor.  - Previous Crestor dose not recommended in patients with creatinine clearance less than 30. - Start therapy with Lipitor 40 mg daily.    Type II Diabetes with renal manifestations - Hold Tradjenta. - SSI, insulin sensitive scale, before every meal /at bedtime. - Check hemoglobin A1c.    Paroxysmal atrial fibrillation - Currently in normal sinus rhythm. Continue Xarelto. Continue beta blocker. Rate controlled.    Primary cancer of lower-inner quadrant of right female breast - Continue Arimidex.    MGUS (monoclonal gammopathy of unknown significance) - Monitor renal function.    Weight loss, unintentional / severe protein calorie malnutrition - Dietitian consultation requested. - Check TSH. - Need to consider recurrent malignancy.    Recurrent right pleural  effusion - May need to consider thoracentesis.    Elevated troponin - Mild, likely related to decompensated CHF. Cycle troponins every 6 hours 3. - Aspirin started.    Stage III chronic kidney disease -  Renal function stable and had usual baseline values with baseline creatinine of 1.4.    DVT prophylaxis - On Xarelto chronically.  Code Status: Full Family Communication: Alison Gross, husband at the bedside and 2 daughters. Disposition Plan: Home when stable, likely 2 days, and cardiac workup completed and heart failure symptoms resolved.  Time spent: 70 minutes.  RAMA,CHRISTINA [facility] Pager [phone] Cell: [phone]   If 7PM-7AM, please contact night-coverage [email or web address] Password [facility] 07/26/2014, 4:50 PM

## 2014-07-26 NOTE — ED Notes (Signed)
Pt here for chronic sob on exertion.  Husband wants pt to be admitted to be evaluated due to dyspnea on exertion as they are going on a trip in October.  He is concerned that her DOE has been worsening progressively.  No acute distress noted while talking to patient.  Denies chest pain.

## 2014-07-27 ENCOUNTER — Inpatient Hospital Stay (HOSPITAL_COMMUNITY): Payer: Commercial Managed Care - HMO

## 2014-07-27 ENCOUNTER — Encounter (HOSPITAL_COMMUNITY): Payer: Self-pay | Admitting: Internal Medicine

## 2014-07-27 DIAGNOSIS — C50311 Malignant neoplasm of lower-inner quadrant of right female breast: Secondary | ICD-10-CM

## 2014-07-27 DIAGNOSIS — E049 Nontoxic goiter, unspecified: Secondary | ICD-10-CM | POA: Diagnosis present

## 2014-07-27 DIAGNOSIS — E440 Moderate protein-calorie malnutrition: Secondary | ICD-10-CM | POA: Insufficient documentation

## 2014-07-27 DIAGNOSIS — D472 Monoclonal gammopathy: Secondary | ICD-10-CM

## 2014-07-27 HISTORY — DX: Nontoxic goiter, unspecified: E04.9

## 2014-07-27 LAB — CBC
HCT: 31 % — ABNORMAL LOW (ref 36.0–46.0)
Hemoglobin: 9.7 g/dL — ABNORMAL LOW (ref 12.0–15.0)
MCH: 27.5 pg (ref 26.0–34.0)
MCHC: 31.3 g/dL (ref 30.0–36.0)
MCV: 87.8 fL (ref 78.0–100.0)
Platelets: 303 10*3/uL (ref 150–400)
RBC: 3.53 MIL/uL — ABNORMAL LOW (ref 3.87–5.11)
RDW: 15.4 % (ref 11.5–15.5)
WBC: 4.7 10*3/uL (ref 4.0–10.5)

## 2014-07-27 LAB — BASIC METABOLIC PANEL
Anion gap: 8 (ref 5–15)
BUN: 41 mg/dL — ABNORMAL HIGH (ref 6–20)
CALCIUM: 10.8 mg/dL — AB (ref 8.9–10.3)
CHLORIDE: 99 mmol/L — AB (ref 101–111)
CO2: 32 mmol/L (ref 22–32)
CREATININE: 1.61 mg/dL — AB (ref 0.44–1.00)
GFR calc Af Amer: 34 mL/min — ABNORMAL LOW (ref >60)
GFR calc non Af Amer: 29 mL/min — ABNORMAL LOW (ref >60)
GLUCOSE: 104 mg/dL — AB (ref 65–99)
Potassium: 4.2 mmol/L (ref 3.5–5.1)
Sodium: 139 mmol/L (ref 135–145)

## 2014-07-27 LAB — GLUCOSE, CAPILLARY
GLUCOSE-CAPILLARY: 94 mg/dL (ref 65–99)
Glucose-Capillary: 85 mg/dL (ref 65–99)
Glucose-Capillary: 121 mg/dL — ABNORMAL HIGH (ref 65–99)
Glucose-Capillary: 141 mg/dL — ABNORMAL HIGH (ref 65–99)

## 2014-07-27 LAB — TSH: TSH: 1.036 u[IU]/mL (ref 0.350–4.500)

## 2014-07-27 LAB — TROPONIN I: TROPONIN I: 0.2 ng/mL — AB (ref <0.031)

## 2014-07-27 MED ORDER — FUROSEMIDE 40 MG PO TABS: 40.0000 mg | ORAL_TABLET | Freq: Every day | ORAL | Status: DC

## 2014-07-27 MED ORDER — TECHNETIUM TC 99M MEDRONATE IV KIT
26.3000 | PACK | Freq: Once | INTRAVENOUS | Status: AC | PRN
  Administered 2014-07-27: 26.3 via INTRAVENOUS

## 2014-07-27 MED ORDER — ALUM & MAG HYDROXIDE-SIMETH 200-200-20 MG/5ML PO SUSP
30.0000 mL | ORAL | Status: DC | PRN
  Administered 2014-07-27: 30 mL via ORAL
  Filled 2014-07-27: qty 30

## 2014-07-27 MED ORDER — PRO-STAT SUGAR FREE PO LIQD
30.0000 mL | Freq: Two times a day (BID) | ORAL | Status: DC
  Administered 2014-07-27 – 2014-07-28 (×3): 30 mL via ORAL
  Filled 2014-07-27 (×3): qty 30

## 2014-07-27 MED ORDER — SIMETHICONE 40 MG/0.6ML PO SUSP
80.0000 mg | Freq: Four times a day (QID) | ORAL | Status: DC | PRN
  Administered 2014-07-27: 80 mg via ORAL
  Filled 2014-07-27 (×2): qty 1.2

## 2014-07-27 NOTE — Care Management Note (Signed)
Case Management Note  Patient Details  Name: REIANA POTEET MRN: [id number] Date of Birth: 1932/12/10  Subjective/Objective:  81 yo F admitted with CHF.                 Action/Plan:pt is from home Anticipate no discharge needs. Will continue to follow.  Expected Discharge Date:                 Expected Discharge Plan:  Home/Self Care  In-House Referral:     Discharge planning Services  CM Consult  Post Acute Care Choice:    Choice offered to:     DME Arranged:    DME Agency:     HH Arranged:    HH Agency:     Status of Service:  In process, will continue to follow  Medicare Important Message Given:    Date Medicare IM Given:    Medicare IM give by:    Date Additional Medicare IM Given:    Additional Medicare Important Message give by:     If discussed at [place] of Stay Meetings, dates discussed:    Additional Comments:  Dessa Phi, RN 07/27/2014, 2:28 PM

## 2014-07-27 NOTE — Progress Notes (Signed)
Pt blood pressure has been running lower than baseline.  Current bp is 91/44. Pulse 80. Respirations 18. Made MD aware.

## 2014-07-27 NOTE — Progress Notes (Addendum)
Initial Nutrition Assessment  DOCUMENTATION CODES:  Non-severe (moderate) malnutrition in context of chronic illness  INTERVENTION: - Ensure Enlive BID - Prostat BID - RD to continue to monitor for needs  NUTRITION DIAGNOSIS:  Increased nutrient needs related to cancer and cancer related treatments, chronic illness as evidenced by estimated needs.  GOAL:  Patient will meet greater than or equal to 90% of their needs  MONITOR:  PO intake, Supplement acceptance, Labs  REASON FOR ASSESSMENT:  Consult Assessment of nutrition requirement/status  ASSESSMENT: Per H&P, 81 y.o. female with a PMH of PAF on Xarelto, breast cancer s/p mastectomy/chemotherapy, right sided pleural effusion requiring thoracentesis 11/2013 (negative cytology), who has had progressive moderate to severe dyspnea/DOE over the past month.  Pt seen for consult. BMI indicates normal weight status. Pt ate 100% of lunch and 75% of dinner yesterday. Lunch tray delivered shortly before RD visit and pt unable to start eating yet. Pt reports that PTA her appetite fluctuated and that sometimes she felt hungry and sometimes she would not eat due to lack of appetite. She drank Ensure or similar supplement PTA and drank part of bottle this AM. Will also add Prostat to supplement. Pt and family very interested in which foods pt should eat for each meal and what she can eat when she is not hungry. Will provide her with information concerning second topic and encouraged pt to eat whatever she is able to and to drink Ensure throughout the day with a focus on protein at each meal.  Pt unsure of UBW but states that she began steadily losing weight 2 years ago after mastectomy. Physical assessment indicated moderate muscle and fat wasting. Labs and medications reviewed.   Height:  Ht Readings from Last 1 Encounters:  07/27/14 '5\' 5"'$  (1.651 m)    Weight:  Wt Readings from Last 1 Encounters:  07/27/14 119 lb 0.8 oz (54 kg)     Ideal Body Weight:  56.8 kg (kg)  Wt Readings from Last 10 Encounters:  07/27/14 119 lb 0.8 oz (54 kg)  07/18/14 120 lb 4.8 oz (54.568 kg)  01/25/14 126 lb 3.2 oz (57.244 kg)  11/29/13 130 lb (58.968 kg)  10/25/13 131 lb 11.2 oz (59.739 kg)  10/12/13 132 lb (59.875 kg)  07/21/13 132 lb 8 oz (60.102 kg)  07/08/13 135 lb 9.6 oz (61.508 kg)  04/21/13 137 lb 14.4 oz (62.551 kg)  02/15/13 141 lb 6.4 oz (64.139 kg)    BMI:  Body mass index is 19.81 kg/(m^2).  Estimated Nutritional Needs:  Kcal:  1500-1700  Protein:  60-75 grams  Fluid:  2L/day  Skin:  Reviewed, no issues  Diet Order:  Diet heart healthy/carb modified Room service appropriate?: Yes; Fluid consistency:: Thin  EDUCATION NEEDS: Education provided to pt and family; will provide with handouts once they are obtained from [place] [address]   Intake/Output Summary (Last 24 hours) at 07/27/14 1157 Last data filed at 07/27/14 0511  Gross per 24 hour  Intake    723 ml  Output   4100 ml  Net  -3377 ml    Last BM:  PTA   Jarome Matin, RD, LDN Inpatient Clinical Dietitian Pager # [phone] After hours/weekend pager # [phone]   ADDENDUM: Per weight hx review, pt has lost 7 lbs (5.5% body weight) in 6 months which is not significant for time frame. Of note, pt's last chemotherapy session was about 3 weeks ago.

## 2014-07-27 NOTE — Progress Notes (Signed)
Patient c/o excessive gas. PCP on call was notified.

## 2014-07-27 NOTE — Progress Notes (Signed)
  Echocardiogram 2D Echocardiogram has been performed.  Alison Gross [name] 07/27/2014, 3:19 PM

## 2014-07-27 NOTE — Progress Notes (Addendum)
Progress Note   Alison Gross:[id number] DOB: 1932/11/20 DOA: 07/26/2014 PCP: Maximino Greenland, MD   Brief Narrative:   Alison Gross is an 81 y.o. female with PMH of chronic diastolic CHF (grade 2 diastolic dysfunction by echo done 8/15), PAF on Xarelto, breast cancer s/p mastectomy/chemotherapy, right sided pleural effusion requiring thoracentesis 11/2013 (negative cytology), who was admitted 07/26/14 with a chief complaint of a one-month history of worsening chest tightness and progressive shortness of breath with activity. Upon presentation, BNP was 1007.4 and chest x-ray showed a moderate right pleural effusion with collapse/consolidation in the right lower lobe with a tiny left pleural effusion.  Assessment/Plan:   Principal Problem:  Acute on chronic diastolic heart failure - Known history of grade 2 diastolic dysfunction, concentric hypertrophy and pulmonary hypertension. - Elevated BNP and chest radiograph findings consistent with acute decompensation. - I/O- 3.4 L. Weight down 7 pounds. - Creatinine slightly higher with diuresis.  Stop Lasix. - Repeat 2-D echocardiogram ordered and is pending at this time. - Troponins mildly elevated. Negative stress test 10/2013, no need for further cardiac workup unless patient does not respond to medical therapy.  Active Problems:   Hypercalcemia - Noted dating back to 01/18/14. - Check PTH. Given history of breast cancer and significant weight loss, will get bone survey.   Hypertension - Continue losartan and metoprolol as BP tolerates. HCTZ on hold.    Dyslipidemia - Has not been compliant with Crestor.  - Previous Crestor dose not recommended in patients with creatinine clearance less than 30. - Management changed from Crestor to Lipitor given reduced creatinine clearance.   Type II Diabetes with renal manifestations - Holding Tradjenta. - Currently being managed with SSI, insulin sensitive scale, before every meal  /at bedtime. CBGs 103-126. - Follow-up hemoglobin A1c.   Paroxysmal atrial fibrillation - Currently in normal sinus rhythm. Continue Xarelto. Continue beta blocker. Rate controlled with heart rate in the 60s-70s.   Primary cancer of lower-inner quadrant of right female breast - Continue Arimidex.   MGUS (monoclonal gammopathy of unknown significance) - Monitor renal function.   Weight loss, unintentional / severe protein calorie malnutrition - Dietitian consultation requested. - TSH is 1.036, WNL. - Need to consider recurrent malignancy.   Recurrent right pleural effusion - May need to consider thoracentesis.   Elevated troponin - Mild, likely related to decompensated CHF. Follow-up 2-D echo rule out regional wall motion abnormalities. - Continue Aspirin.   Stage III chronic kidney disease - Renal function slightly worse with diuresis, creatinine 1.4 hour ---> 1.6.   Goiter  - TSH WNL.  Has a history of goiter in the past treated with surgical removal and RAI.  Prior pathology showed thyroiditis. - Will repeat thyroid U/S with biopsy.  Hold Xarelto tonight for biopsy 07/28/14.    DVT prophylaxis - On Xarelto chronically.  Code Status: Full Family Communication: Nancie Bocanegra, husband at the bedside and 2 daughters. Disposition Plan: Home when stable, likely 07/28/14 when cardiac workup completed, bone scan done and heart failure symptoms resolved.  IV Access:    Peripheral IV   Procedures and diagnostic studies:   Dg Chest 2 View  07/26/2014   CLINICAL DATA:  Shortness of breath and chest tightness for several months. History of breast cancer and pleural effusion.  EXAM: CHEST  2 VIEW  COMPARISON:  11/16/2013 and CT chest 10/28/2013.  FINDINGS: Trachea is deviated to the left by a known large goiter. Heart size is grossly within normal  limits. Moderate right pleural effusion with collapse/ consolidation in the adjacent right lung. Tiny left pleural effusion.  Postoperative changes from bilateral mastectomies.  IMPRESSION: 1. Moderate right pleural effusion with collapse/consolidation in the right lower lobe. 2. Tiny left pleural effusion. 3. Large goiter.   Electronically Signed   By: Lorin Picket M.D.   On: 07/26/2014 10:15     Medical Consultants:    Telephone consultation with Dr. Nadyne Coombes, Cardiology  Anti-Infectives:    None.  Subjective:   [name] says her breathing is better.  No chest pain.  Appetite good.    Objective:    Filed Vitals:   07/26/14 1819 07/26/14 2121 07/27/14 0511 07/27/14 0520  BP: 107/52 108/59 96/49   Pulse: 64 74 74   Temp: 98.4 F (36.9 C) 98.4 F (36.9 C) 98.2 F (36.8 C)   TempSrc: Oral Oral Oral   Resp: '18 16 16   '$ Height:      Weight:    51.2 kg (112 lb 14 oz)  SpO2: 97% 97% 96%     Intake/Output Summary (Last 24 hours) at 07/27/14 0707 Last data filed at 07/27/14 0511  Gross per 24 hour  Intake    723 ml  Output   4100 ml  Net  -3377 ml    Exam: Gen:  NAD Cardiovascular:  RRR, No M/R/G, split S2 with inspiration, +JVD Respiratory:  Lungs with a few bibasilar crackles Gastrointestinal:  Abdomen soft, NT/ND, + BS Extremities:  No C/E/C   Data Reviewed:    Labs: Basic Metabolic Panel:  Recent Labs Lab 07/26/14 1010 07/27/14 0515  NA 140 139  K 3.8 4.2  CL 102 99*  CO2 30 32  GLUCOSE 96 104*  BUN 31* 41*  CREATININE 1.45* 1.61*  CALCIUM 11.2* 10.8*   GFR Estimated Creatinine Clearance: 22.9 mL/min (by C-G formula based on Cr of 1.61). Liver Function Tests:  Recent Labs Lab 07/26/14 1825  AST 38  ALT 33  ALKPHOS 85  BILITOT 0.9  PROT 7.1  ALBUMIN 3.5   CBC:  Recent Labs Lab 07/26/14 1010 07/27/14 0515  WBC 3.9* 4.7  NEUTROABS 2.5  --   HGB 11.0* 9.7*  HCT 34.3* 31.0*  MCV 87.3 87.8  PLT 302 303   Cardiac Enzymes:  Recent Labs Lab 07/26/14 1010 07/26/14 1825 07/26/14 2235 07/27/14 0515  TROPONINI 0.20* 0.20* 0.18* 0.20*    CBG:  Recent Labs Lab 07/26/14 1728 07/26/14 2141  GLUCAP 126* 103*   Thyroid function studies:  Recent Labs  07/27/14 0515  TSH 1.036    Microbiology Recent Results (from the past 240 hour(s))  MRSA PCR Screening     Status: None   Collection Time: 07/26/14  1:47 PM  Result Value Ref Range Status   MRSA by PCR NEGATIVE NEGATIVE Final    Comment:        The GeneXpert MRSA Assay (FDA approved for NASAL specimens only), is one component of a comprehensive MRSA colonization surveillance program. It is not intended to diagnose MRSA infection nor to guide or monitor treatment for MRSA infections.      Medications:   . anastrozole  1 mg Oral Q breakfast  . aspirin EC  81 mg Oral Daily  . atorvastatin  40 mg Oral q1800  . cholecalciferol  2,000 Units Oral QODAY  . feeding supplement (ENSURE ENLIVE)  237 mL Oral BID BM  . furosemide  40 mg Intravenous BID  . losartan  100 mg Oral  Daily   And  . hydrochlorothiazide  12.5 mg Oral Daily  . insulin aspart  0-5 Units Subcutaneous QHS  . insulin aspart  0-9 Units Subcutaneous TID WC  . metoprolol  50 mg Oral BID  . multivitamin with minerals  1 tablet Oral QODAY  . potassium chloride  20 mEq Oral BID  . Rivaroxaban  15 mg Oral QHS  . sodium chloride  3 mL Intravenous Q12H  . sodium chloride  3 mL Intravenous Q12H   Continuous Infusions:   Time spent: 35 minutes with > 50% of time discussing current diagnostic test results, clinical impression and plan of care.    LOS: 1 day   RAMA,CHRISTINA  [facility] Pager [phone]. If unable to reach me by pager, please call my cell phone at [phone].  *Please refer to [email or web address], password [email or web address] to get updated schedule on who will round on this patient, as hospitalists switch teams weekly. If 7PM-7AM, please contact night-coverage at [email or web address], password [email or web address] for any overnight needs.  07/27/2014, 7:07 AM

## 2014-07-28 ENCOUNTER — Inpatient Hospital Stay (HOSPITAL_COMMUNITY): Payer: Commercial Managed Care - HMO

## 2014-07-28 DIAGNOSIS — N183 Chronic kidney disease, stage 3 (moderate): Secondary | ICD-10-CM

## 2014-07-28 DIAGNOSIS — E049 Nontoxic goiter, unspecified: Secondary | ICD-10-CM

## 2014-07-28 DIAGNOSIS — E440 Moderate protein-calorie malnutrition: Secondary | ICD-10-CM

## 2014-07-28 LAB — BASIC METABOLIC PANEL
ANION GAP: 7 (ref 5–15)
BUN: 50 mg/dL — ABNORMAL HIGH (ref 6–20)
CO2: 31 mmol/L (ref 22–32)
Calcium: 10.5 mg/dL — ABNORMAL HIGH (ref 8.9–10.3)
Chloride: 101 mmol/L (ref 101–111)
Creatinine, Ser: 1.6 mg/dL — ABNORMAL HIGH (ref 0.44–1.00)
GFR calc Af Amer: 34 mL/min — ABNORMAL LOW (ref >60)
GFR, EST NON AFRICAN AMERICAN: 30 mL/min — AB (ref >60)
Glucose, Bld: 98 mg/dL (ref 65–99)
POTASSIUM: 5 mmol/L (ref 3.5–5.1)
SODIUM: 139 mmol/L (ref 135–145)

## 2014-07-28 LAB — GLUCOSE, CAPILLARY
GLUCOSE-CAPILLARY: 91 mg/dL (ref 65–99)
GLUCOSE-CAPILLARY: 78 mg/dL (ref 65–99)
Glucose-Capillary: 65 mg/dL (ref 65–99)

## 2014-07-28 LAB — PTH, INTACT AND CALCIUM
Calcium, Total (PTH): 10.7 mg/dL — ABNORMAL HIGH (ref 8.7–10.3)
PTH: 10 pg/mL — AB (ref 15–65)

## 2014-07-28 LAB — HEMOGLOBIN A1C
Hgb A1c MFr Bld: 6.2 % — ABNORMAL HIGH (ref 4.8–5.6)
Mean Plasma Glucose: 131 mg/dL

## 2014-07-28 MED ORDER — ASPIRIN 81 MG PO TBEC: 81.0000 mg | DELAYED_RELEASE_TABLET | Freq: Every day | ORAL | Status: DC

## 2014-07-28 MED ORDER — FUROSEMIDE 40 MG PO TABS: 40.0000 mg | ORAL_TABLET | Freq: Every day | ORAL | Status: DC | PRN

## 2014-07-28 MED ORDER — PRO-STAT SUGAR FREE PO LIQD: 30.0000 mL | Freq: Two times a day (BID) | ORAL | Status: DC

## 2014-07-28 MED ORDER — ATORVASTATIN CALCIUM 40 MG PO TABS: 40.0000 mg | ORAL_TABLET | Freq: Every day | ORAL | Status: DC

## 2014-07-28 MED ORDER — ENSURE ENLIVE PO LIQD: 237.0000 mL | Freq: Two times a day (BID) | ORAL | Status: DC

## 2014-07-28 NOTE — Care Management Note (Signed)
Case Management Note  Patient Details  Name: DEZIREE MOKRY MRN: [id number] Date of Birth: January 31, 1933  Subjective/Objective:                    Action/Plan:d/c home no needs or orders.   Expected Discharge Date:                 Expected Discharge Plan:  Home/Self Care  In-House Referral:     Discharge planning Services  CM Consult  Post Acute Care Choice:    Choice offered to:     DME Arranged:    DME Agency:     HH Arranged:    [place]:     Status of Service:  Completed, signed off  Medicare Important Message Given:  Yes Date Medicare IM Given:  07/28/14 Medicare IM give by:  Dessa Phi Date Additional Medicare IM Given:    Additional Medicare Important Message give by:     If discussed at [place] of Stay Meetings, dates discussed:    Additional Comments:  Dessa Phi, RN 07/28/2014, 1:05 PM

## 2014-07-28 NOTE — Progress Notes (Signed)
Request for US guided thyroid nodule FNA secondary to goiter on exam. Last FNA 11/22/2013 cytopathology reports obtained and revealed right and left inferior thyroid nodules findings consistent with lymphocytic thyroiditis. [place] today with minimal change from previous US, no need for FNA at this time.  Tsosie Billing PA-C Interventional Radiology  07/28/14  12:30 PM

## 2014-07-28 NOTE — Discharge Summary (Signed)
Physician Discharge Summary  Alison Gross PYK:[id number] DOB: 30-Apr-1932 DOA: 07/26/2014  PCP: Maximino Greenland, MD  Admit date: 07/26/2014 Discharge date: 07/28/2014   Recommendations for Outpatient Follow-Up:   1. Patient has follow-up scheduled with her PCP in early June. Recommend consideration for further workup of abnormal weight loss and persistent hypercalcemia.   Discharge Diagnosis:   Principal Problem:    Acute on chronic diastolic heart failure Active Problems:    HTN (hypertension)    OA (osteoarthritis) of knee    Primary cancer of lower-inner quadrant of right female breast    MGUS (monoclonal gammopathy of unknown significance)    Weight loss, unintentional    Recurrent right pleural effusion    Elevated troponin    Stage III chronic kidney disease    Protein-calorie malnutrition, severe    Paroxysmal atrial fibrillation    Dyslipidemia    Type 2 diabetes mellitus with renal manifestations    Goiter    Malnutrition of moderate degree   Discharge disposition:  Home.    Discharge Condition: Improved.  Diet recommendation: Low sodium, heart healthy.  Carbohydrate-modified.     History of Present Illness:   Alison Gross is an 81 y.o. female with PMH of chronic diastolic CHF (grade 2 diastolic dysfunction by echo done 8/15), PAF on Xarelto, breast cancer s/p mastectomy/chemotherapy, right sided pleural effusion requiring thoracentesis 11/2013 (negative cytology), who was admitted 07/26/14 with a chief complaint of a one-month history of worsening chest tightness and progressive shortness of breath with activity. Upon presentation, BNP was 1007.4 and chest x-ray showed a moderate right pleural effusion with collapse/consolidation in the right lower lobe with a tiny left pleural effusion.   Hospital Course by Problem:   Principal Problem:  Acute on chronic diastolic heart failure - Known history of grade 2 diastolic dysfunction, concentric  hypertrophy and pulmonary hypertension. - Elevated BNP and chest radiograph findings consistent with acute decompensation. - Creatinine slightly higher with diuresis. Lasix D/C'd 07/27/14. - Repeat 2-D echocardiogram: EF 60-65%, grade II diastolic dysfunction, unchanged from prior. - Troponins mildly elevated. Negative stress test 10/2013, no need for further cardiac workup unless patient does not respond to medical therapy. Case discussed with the patient's primary cardiologist, Dr. Nadyne Coombes. - Dyspnea resolved at discharge. Given a prescription for Lasix with instructions to weigh herself daily and to give herself a dose of Lasix for any 3 pound weight gain/24 hours or 5 pound weight gain/one week.  Active Problems:  Hypercalcemia - Noted dating back to 01/18/14. Bone scan negative for metastatic disease. - PTH low at 10, so not hyperparathyroid. - Recommend ongoing assessment by PCP.   Hypertension - Continue losartan, metoprolol and HCTZ.    Dyslipidemia - Has not been compliant with Crestor.  - Previous Crestor dose not recommended in patients with creatinine clearance less than 30. - Management changed from Crestor to Lipitor given reduced creatinine clearance.   Type II Diabetes with renal manifestations - Controlled on Tradjenta. - Hemoglobin A1c 6.2%.   Paroxysmal atrial fibrillation - Currently in normal sinus rhythm. Continue Xarelto. Continue beta blocker. Rate controlled with heart rate in the 60s-70s.   Primary cancer of lower-inner quadrant of right female breast - Continue Arimidex.   MGUS (monoclonal gammopathy of unknown significance) - Monitor renal function.   Weight loss, unintentional / severe protein calorie malnutrition - Dietitian consultation requested. - TSH is 1.036, WNL. - Need to consider recurrent malignancy.   Recurrent right pleural effusion - May need to  consider thoracentesis.   Elevated troponin - Mild, likely related to  decompensated CHF. Follow-up 2-D echo rule out regional wall motion abnormalities. - Continue Aspirin.   Stage III chronic kidney disease - Stable. Follow-up with PCP.   Goiter  - TSH WNL. Has a history of goiter in the past treated with surgical removal and RAI. Prior pathology showed thyroiditis. - Repeat ultrasound shows stability of goiter. No need for biopsy per IR.   DVT prophylaxis - On Xarelto chronically.    Medical Consultants:    Interventional Radiology   Discharge Exam:   Filed Vitals:   07/28/14 1118  BP: 117/61  Pulse: 75  Temp:   Resp:    Filed Vitals:   07/27/14 1357 07/27/14 2119 07/28/14 0436 07/28/14 1118  BP: 100/49 98/50 104/55 117/61  Pulse: 83 86 71 75  Temp: 97.8 F (36.6 C) 97.9 F (36.6 C) 98 F (36.7 C)   TempSrc: Oral Axillary Oral   Resp: '17 16 16   '$ Height:      Weight:   51.665 kg (113 lb 14.4 oz)   SpO2: 97% 96% 98%     Gen:  NAD Neck: Thyromegaly present Cardiovascular:  RRR, No M/R/G, +JVD Respiratory: Lungs diminished on the right Gastrointestinal: Abdomen soft, NT/ND with normal active bowel sounds. Extremities: No C/E/C   The results of significant diagnostics from this hospitalization (including imaging, microbiology, ancillary and laboratory) are listed below for reference.     Procedures and Diagnostic Studies:   Dg Chest 2 View  07/26/2014   CLINICAL DATA:  Shortness of breath and chest tightness for several months. History of breast cancer and pleural effusion.  EXAM: CHEST  2 VIEW  COMPARISON:  11/16/2013 and CT chest 10/28/2013.  FINDINGS: Trachea is deviated to the left by a known large goiter. Heart size is grossly within normal limits. Moderate right pleural effusion with collapse/ consolidation in the adjacent right lung. Tiny left pleural effusion. Postoperative changes from bilateral mastectomies.  IMPRESSION: 1. Moderate right pleural effusion with collapse/consolidation in the right lower lobe. 2.  Tiny left pleural effusion. 3. Large goiter.   Electronically Signed   By: Lorin Picket M.D.   On: 07/26/2014 10:15   Nm Bone Scan Whole Body  07/27/2014   CLINICAL DATA:  Right breast cancer.  Kidney disease.  EXAM: NUCLEAR MEDICINE WHOLE BODY BONE SCAN  TECHNIQUE: Whole body anterior and posterior images were obtained approximately 3 hours after intravenous injection of radiopharmaceutical.  RADIOPHARMACEUTICALS:  26.3 mCi Technetium-64mMDP IV  COMPARISON:  PET-CT 10/28/2013  FINDINGS: There is increased radiotracer uptake within the lower lumbar spine. This is likely related to degenerative disc disease. No abnormal focus of increased uptake identified to suggest bone metastasis. Normal physiologic tracer activity is seen within the kidneys an urinary bladder.  IMPRESSION: 1. No evidence for metastatic disease. 2. Lumbar spondylosis noted.   Electronically Signed   By: TKerby MoorsM.D.   On: 07/27/2014 14:18   [place] Tissue Head/neck  07/28/2014   CLINICAL DATA:  Evaluate thyroid goiter. History of radioactive iodine treatment. Prior thyroid biopsies in 2015.  EXAM: THYROID ULTRASOUND  TECHNIQUE: Ultrasound examination of the thyroid gland and adjacent soft tissues was performed.  COMPARISON:  11/16/2013  FINDINGS: Right thyroid lobe  Measurements: 7.6 x 4.7 x 3.1 cm (previously 7.6 x 4.3 x 2.8 cm). There is a dominant hypoechoic nodule in the superior right thyroid lobe that measures 2.8 x 1.7 x 2.5 cm and previously measured  2.7 x 1.6 x 2.6 cm. There is a dominant hyperechoic nodule in the inferior right thyroid lobe that measures 4.1 x 2.2 x 2.5 cm and previously measured 4.0 x 2.3 x 2.5 cm. There is a complex inferior right thyroid nodule that measures 2.2 x 1.5 x 1.6 cm and previously measured 2.2 x 1.3 x 1.6 cm. The right thyroid lobe is diffusely heterogeneous and hypervascular.  Left thyroid lobe  Measurements: 6.3 x 3.6 x 3.6 cm (previously 6.2 x 3.6 x 3.1 cm). There is mildly complex  nodule along the superior left thyroid lobe that measures 2.7 x 1.8 x 1.9 cm and previously measured 2.6 x 1.8 x 1.6 cm. There is a large heterogeneous nodule in the inferior left thyroid lobe that measures 3.5 x 2.8 x 3.0 cm and previously measured 3.5 x 2.9 x 2.6 cm.  Isthmus  Thickness: 0.7 cm (previously 0.6 cm). There is a heterogeneous nodule along the left side of the isthmus that measures 1.6 x 1.6 x 1.0 cm and previously measured 1.6 x 1.2 x 0.7 cm.  Lymphadenopathy  None visualized.  IMPRESSION: Multinodular goiter. The dominant nodules have minimally changed in size or morphology since 2015.   Electronically Signed   By: Markus Daft M.D.   On: 07/28/2014 12:12     Labs:   Basic Metabolic Panel:  Recent Labs Lab 07/26/14 1010 07/27/14 0515 07/27/14 0830 07/28/14 0355  NA 140 139  --  139  K 3.8 4.2  --  5.0  CL 102 99*  --  101  CO2 30 32  --  31  GLUCOSE 96 104*  --  98  BUN 31* 41*  --  50*  CREATININE 1.45* 1.61*  --  1.60*  CALCIUM 11.2* 10.8* 10.7* 10.5*   GFR Estimated Creatinine Clearance: 23.3 mL/min (by C-G formula based on Cr of 1.6). Liver Function Tests:  Recent Labs Lab 07/26/14 1825  AST 38  ALT 33  ALKPHOS 85  BILITOT 0.9  PROT 7.1  ALBUMIN 3.5   CBC:  Recent Labs Lab 07/26/14 1010 07/27/14 0515  WBC 3.9* 4.7  NEUTROABS 2.5  --   HGB 11.0* 9.7*  HCT 34.3* 31.0*  MCV 87.3 87.8  PLT 302 303   Cardiac Enzymes:  Recent Labs Lab 07/26/14 1010 07/26/14 1825 07/26/14 2235 07/27/14 0515  TROPONINI 0.20* 0.20* 0.18* 0.20*   BNP: Invalid input(s): POCBNP CBG:  Recent Labs Lab 07/27/14 1640 07/27/14 2118 07/28/14 0720 07/28/14 1159 07/28/14 1221  GLUCAP 94 141* 91 65 78   Hgb A1c  Recent Labs  07/27/14 0515  HGBA1C 6.2*   Thyroid function studies  Recent Labs  07/27/14 0515  TSH 1.036   Microbiology Recent Results (from the past 240 hour(s))  MRSA PCR Screening     Status: None   Collection Time: 07/26/14  1:47 PM    Result Value Ref Range Status   MRSA by PCR NEGATIVE NEGATIVE Final    Comment:        The GeneXpert MRSA Assay (FDA approved for NASAL specimens only), is one component of a comprehensive MRSA colonization surveillance program. It is not intended to diagnose MRSA infection nor to guide or monitor treatment for MRSA infections.      Discharge Instructions:   Discharge Instructions    (HEART FAILURE PATIENTS) Call MD:  Anytime you have any of the following symptoms: 1) 3 pound weight gain in 24 hours or 5 pounds in 1 week 2) shortness of breath,  with or without a dry hacking cough 3) swelling in the hands, feet or stomach 4) if you have to sleep on extra pillows at night in order to breathe.    Complete by:  As directed      Call MD for:  extreme fatigue    Complete by:  As directed      Call MD for:    Complete by:  As directed   Ongoing weight loss.     Diet - low sodium heart healthy    Complete by:  As directed      Diet Carb Modified    Complete by:  As directed      Discharge instructions    Complete by:  As directed   You were treated for heart failure in the hospital.  To prevent exacerbations of your heart failure, it is important that you check your weight at the same time every day, and that if you gain over 3 pounds, OR you develop worsening swelling to the legs, experience more shortness of breath or chest pain, you call your Primary MD immediately. Take a dose of lasix if you gain more than 3 lbs in 24 hours or more than 5 lbs in 1 week.  Follow a heart healthy, low salt diet and restrict your fluid intake to 1.5 liters a day or less.     Increase activity slowly    Complete by:  As directed             Medication List    STOP taking these medications        rosuvastatin 20 MG tablet  Commonly known as:  CRESTOR      TAKE these medications        anastrozole 1 MG tablet  Commonly known as:  ARIMIDEX  TAKE 1 TABLET (1 MG TOTAL) BY MOUTH DAILY.      aspirin 81 MG EC tablet  Take 1 tablet (81 mg total) by mouth daily.     atorvastatin 40 MG tablet  Commonly known as:  LIPITOR  Take 1 tablet (40 mg total) by mouth daily at 6 PM.     feeding supplement (ENSURE ENLIVE) Liqd  Take 237 mLs by mouth 2 (two) times daily between meals.     feeding supplement (PRO-STAT SUGAR FREE 64) Liqd  Take 30 mLs by mouth 2 (two) times daily.     furosemide 40 MG tablet  Commonly known as:  LASIX  Take 1 tablet (40 mg total) by mouth daily as needed.     losartan-hydrochlorothiazide 100-12.5 MG per tablet  Commonly known as:  HYZAAR  Take 1 tablet by mouth daily.     metoprolol 50 MG tablet  Commonly known as:  LOPRESSOR  Take 50 mg by mouth 2 (two) times daily.     multivitamin with minerals Tabs tablet  Take 1 tablet by mouth every other day.     TRADJENTA 5 MG Tabs tablet  Generic drug:  linagliptin  Take 5 mg by mouth daily.     Vitamin D 2000 UNITS Caps  Take 2,000 Units by mouth every other day.     XARELTO 20 MG Tabs tablet  Generic drug:  rivaroxaban  Take 20 mg by mouth at bedtime.           Follow-up Information    Follow up with [facility].   Specialty:  Emergency Medicine   Why:  As needed, If symptoms worsen  Contact information:   [address] [id number] [place] [address] [phone]      Follow up with Maximino Greenland, MD.   Specialty:  Internal Medicine   Contact information:   [address] [place] [address] [phone]       Please follow up.   Why:  At your scheduled apptointment in June.       Time coordinating discharge: 35 minutes.  Signed:  RAMA,CHRISTINA  Pager [phone] [facility] 07/28/2014, 3:47 PM

## 2014-07-28 NOTE — Progress Notes (Signed)
CRITICAL VALUE ALERT  Critical value received: glucose Date of notification:  5/20  Time of notification:  1200  Critical value read back:Yes.    Nurse who received alert:  Wess Botts  MD notified (1st page):  Rama  Time of first page:  1224  MD notified (2nd page):  Time of second page:  Responding MD:    Time MD responded:

## 2014-07-28 NOTE — Progress Notes (Signed)
Went over all discharge information with pt and husband.  All questions answered.  Went over importance of taking medications as prescribed and educated on HF.  Pt summary given to husband.  Will wheel out pt out when ready to go.

## 2014-07-28 NOTE — Progress Notes (Signed)
Progress Note   Alison Gross YYT:[id number] DOB: [birth year] DOA: 07/26/2014 PCP: Alison Greenland, MD   Brief Narrative:   Alison Gross is an 81 y.o. female with PMH of chronic diastolic CHF (grade 2 diastolic dysfunction by echo done 8/15), PAF on Xarelto, breast cancer s/p mastectomy/chemotherapy, right sided pleural effusion requiring thoracentesis 11/2013 (negative cytology), who was admitted 07/26/14 with a chief complaint of a one-month history of worsening chest tightness and progressive shortness of breath with activity. Upon presentation, BNP was 1007.4 and chest x-ray showed a moderate right pleural effusion with collapse/consolidation in the right lower lobe with a tiny left pleural effusion.  Assessment/Plan:   Principal Problem:  Acute on chronic diastolic heart failure - Known history of grade 2 diastolic dysfunction, concentric hypertrophy and pulmonary hypertension. - Elevated BNP and chest radiograph findings consistent with acute decompensation. - Creatinine slightly higher with diuresis.  Lasix D/C'd 07/27/14.. - Repeat 2-D echocardiogram: EF 60-65%, grade II diastolic dysfunction, unchanged from prior. - Troponins mildly elevated. Negative stress test 10/2013, no need for further cardiac workup unless patient does not respond to medical therapy.  Active Problems:   Hypercalcemia - Noted dating back to 01/18/14.  Bone scan negative for metastatic disease. - PTH low at 10, so not hyperparathyroid.   Hypertension - Continue losartan and metoprolol as BP tolerates. HCTZ on hold.    Dyslipidemia - Has not been compliant with Crestor.  - Previous Crestor dose not recommended in patients with creatinine clearance less than 30. - Management changed from Crestor to Lipitor given reduced creatinine clearance.   Type II Diabetes with renal manifestations - Holding Tradjenta. - Currently being managed with SSI, insulin sensitive scale, before every meal /at  bedtime. CBGs 85-141. - Hemoglobin A1c 6.2%.   Paroxysmal atrial fibrillation - Currently in normal sinus rhythm. Continue Xarelto. Continue beta blocker. Rate controlled with heart rate in the 60s-70s.   Primary cancer of lower-inner quadrant of right female breast - Continue Arimidex.   MGUS (monoclonal gammopathy of unknown significance) - Monitor renal function.   Weight loss, unintentional / severe protein calorie malnutrition - Dietitian consultation requested. - TSH is 1.036, WNL. - Need to consider recurrent malignancy.   Recurrent right pleural effusion - May need to consider thoracentesis.   Elevated troponin - Mild, likely related to decompensated CHF. Follow-up 2-D echo rule out regional wall motion abnormalities. - Continue Aspirin.   Stage III chronic kidney disease - Renal function slightly worse with diuresis, creatinine 1.4 hour ---> 1.6.   Goiter  - TSH WNL.  Has a history of goiter in the past treated with surgical removal and RAI.  Prior pathology showed thyroiditis. - Will repeat thyroid U/S with biopsy.  Hold Xarelto tonight for biopsy 07/28/14.    DVT prophylaxis - On Xarelto chronically.  Code Status: Full Family Communication: Alison Gross, husband at the bedside and 2 daughters 07/27/14. Disposition Plan: Home when stable, likely 07/28/14 when cardiac workup completed, bone scan done and heart failure symptoms resolved.  IV Access:    Peripheral IV   Procedures and diagnostic studies:   Dg Chest 2 View  07/26/2014   CLINICAL DATA:  Shortness of breath and chest tightness for several months. History of breast cancer and pleural effusion.  EXAM: CHEST  2 VIEW  COMPARISON:  11/16/2013 and CT chest 10/28/2013.  FINDINGS: Trachea is deviated to the left by a known large goiter. Heart size is grossly within normal limits. Moderate right pleural effusion  with collapse/ consolidation in the adjacent right lung. Tiny left pleural effusion.  Postoperative changes from bilateral mastectomies.  IMPRESSION: 1. Moderate right pleural effusion with collapse/consolidation in the right lower lobe. 2. Tiny left pleural effusion. 3. Large goiter.   Electronically Signed   By: Alison Gross M.D.   On: 07/26/2014 10:15   Nm Bone Scan Whole Body  07/27/2014   CLINICAL DATA:  Right breast cancer.  Kidney disease.  EXAM: NUCLEAR MEDICINE WHOLE BODY BONE SCAN  TECHNIQUE: Whole body anterior and posterior images were obtained approximately 3 hours after intravenous injection of radiopharmaceutical.  RADIOPHARMACEUTICALS:  26.3 mCi Technetium-71mMDP IV  COMPARISON:  PET-CT 10/28/2013  FINDINGS: There is increased radiotracer uptake within the lower lumbar spine. This is likely related to degenerative disc disease. No abnormal focus of increased uptake identified to suggest bone metastasis. Normal physiologic tracer activity is seen within the kidneys an urinary bladder.  IMPRESSION: 1. No evidence for metastatic disease. 2. Lumbar spondylosis noted.   Electronically Signed   By: Alison MoorsM.D.   On: 07/27/2014 14:18     Medical Consultants:    Telephone consultation with Dr. GNadyne Coombes Cardiology  Anti-Infectives:    None.  Subjective:   Alison MOESERsays her breathing is "fine".  No chest pain.  No N/V.  Had some bloated feeling last night, better now.  Appetite good.    Objective:    Filed Vitals:   07/27/14 1153 07/27/14 1357 07/27/14 2119 07/28/14 0436  BP:  100/49 98/50 104/55  Pulse:  83 86 71  Temp:  97.8 F (36.6 C) 97.9 F (36.6 C) 98 F (36.7 C)  TempSrc:  Oral Axillary Oral  Resp:  '17 16 16  '$ Height: '5\' 5"'$  (1.651 m)     Weight: 54 kg (119 lb 0.8 oz)   51.665 kg (113 lb 14.4 oz)  SpO2:  97% 96% 98%   No intake or output data in the 24 hours ending 07/28/14 0708  Exam: Gen:  NAD Cardiovascular:  RRR, No M/R/G, split S2 with inspiration, +JVD Respiratory:  Lungs sounds diminished on the right Gastrointestinal:   Abdomen soft, NT/ND, + BS Extremities:  No C/E/C   Data Reviewed:    Labs: Basic Metabolic Panel:  Recent Labs Lab 07/26/14 1010 07/27/14 0515 07/27/14 0830 07/28/14 0355  NA 140 139  --  139  K 3.8 4.2  --  5.0  CL 102 99*  --  101  CO2 30 32  --  31  GLUCOSE 96 104*  --  98  BUN 31* 41*  --  50*  CREATININE 1.45* 1.61*  --  1.60*  CALCIUM 11.2* 10.8* 10.7* 10.5*   GFR Estimated Creatinine Clearance: 23.3 mL/min (by C-G formula based on Cr of 1.6). Liver Function Tests:  Recent Labs Lab 07/26/14 1825  AST 38  ALT 33  ALKPHOS 85  BILITOT 0.9  PROT 7.1  ALBUMIN 3.5   CBC:  Recent Labs Lab 07/26/14 1010 07/27/14 0515  WBC 3.9* 4.7  NEUTROABS 2.5  --   HGB 11.0* 9.7*  HCT 34.3* 31.0*  MCV 87.3 87.8  PLT 302 303   Cardiac Enzymes:  Recent Labs Lab 07/26/14 1010 07/26/14 1825 07/26/14 2235 07/27/14 0515  TROPONINI 0.20* 0.20* 0.18* 0.20*   CBG:  Recent Labs Lab 07/26/14 2141 07/27/14 0730 07/27/14 1140 07/27/14 1640 07/27/14 2118  GLUCAP 103* 85 121* 94 141*   Thyroid function studies:  Recent Labs  07/27/14 0515  TSH  1.036    Microbiology Recent Results (from the past 240 hour(s))  MRSA PCR Screening     Status: None   Collection Time: 07/26/14  1:47 PM  Result Value Ref Range Status   MRSA by PCR NEGATIVE NEGATIVE Final    Comment:        The GeneXpert MRSA Assay (FDA approved for NASAL specimens only), is one component of a comprehensive MRSA colonization surveillance program. It is not intended to diagnose MRSA infection nor to guide or monitor treatment for MRSA infections.      Medications:   . anastrozole  1 mg Oral Q breakfast  . aspirin EC  81 mg Oral Daily  . atorvastatin  40 mg Oral q1800  . cholecalciferol  2,000 Units Oral QODAY  . feeding supplement (ENSURE ENLIVE)  237 mL Oral BID BM  . feeding supplement (PRO-STAT SUGAR FREE 64)  30 mL Oral BID  . insulin aspart  0-5 Units Subcutaneous QHS  .  insulin aspart  0-9 Units Subcutaneous TID WC  . losartan  100 mg Oral Daily  . metoprolol  50 mg Oral BID  . multivitamin with minerals  1 tablet Oral QODAY  . potassium chloride  20 mEq Oral BID  . sodium chloride  3 mL Intravenous Q12H  . sodium chloride  3 mL Intravenous Q12H   Continuous Infusions:   Time spent: 35 minutes with > 50% of time discussing current diagnostic test results, clinical impression and plan of care.    LOS: 2 days   Kayti Poss  [facility] Pager [phone]. If unable to reach me by pager, please call my cell phone at [phone].  *Please refer to [email or web address], password [email or web address] to get updated schedule on who will round on this patient, as hospitalists switch teams weekly. If 7PM-7AM, please contact night-coverage at [email or web address], password [email or web address] for any overnight needs.  07/28/2014, 7:08 AM

## 2014-07-28 NOTE — Discharge Instructions (Signed)

## 2014-07-31 ENCOUNTER — Other Ambulatory Visit: Payer: Self-pay | Admitting: Oncology

## 2014-07-31 DIAGNOSIS — J90 Pleural effusion, not elsewhere classified: Secondary | ICD-10-CM

## 2014-08-01 ENCOUNTER — Telehealth: Payer: Self-pay | Admitting: Oncology

## 2014-08-01 NOTE — Telephone Encounter (Signed)
s.w. pt and advise don 5.27 appt....pt ok and aware

## 2014-08-03 ENCOUNTER — Other Ambulatory Visit: Payer: Self-pay | Admitting: *Deleted

## 2014-08-03 DIAGNOSIS — D0512 Intraductal carcinoma in situ of left breast: Secondary | ICD-10-CM

## 2014-08-04 ENCOUNTER — Encounter: Payer: Self-pay | Admitting: Nurse Practitioner

## 2014-08-04 ENCOUNTER — Ambulatory Visit (HOSPITAL_BASED_OUTPATIENT_CLINIC_OR_DEPARTMENT_OTHER): Payer: Commercial Managed Care - HMO | Admitting: Nurse Practitioner

## 2014-08-04 ENCOUNTER — Ambulatory Visit (HOSPITAL_COMMUNITY)
Admission: RE | Admit: 2014-08-04 | Discharge: 2014-08-04 | Disposition: A | Discharge status: Home / Self Care | Payer: Commercial Managed Care - HMO | Source: Ambulatory Visit | Attending: Oncology | Admitting: Oncology

## 2014-08-04 ENCOUNTER — Other Ambulatory Visit (HOSPITAL_BASED_OUTPATIENT_CLINIC_OR_DEPARTMENT_OTHER): Payer: Commercial Managed Care - HMO

## 2014-08-04 VITALS — BP 130/67 | HR 68 | Temp 97.5°F | Resp 20 | Wt 117.2 lb

## 2014-08-04 DIAGNOSIS — R634 Abnormal weight loss: Secondary | ICD-10-CM

## 2014-08-04 DIAGNOSIS — J90 Pleural effusion, not elsewhere classified: Secondary | ICD-10-CM | POA: Diagnosis not present

## 2014-08-04 DIAGNOSIS — N183 Chronic kidney disease, stage 3 (moderate): Secondary | ICD-10-CM | POA: Diagnosis not present

## 2014-08-04 DIAGNOSIS — D0512 Intraductal carcinoma in situ of left breast: Secondary | ICD-10-CM

## 2014-08-04 DIAGNOSIS — Z85118 Personal history of other malignant neoplasm of bronchus and lung: Secondary | ICD-10-CM | POA: Diagnosis not present

## 2014-08-04 DIAGNOSIS — C50812 Malignant neoplasm of overlapping sites of left female breast: Secondary | ICD-10-CM

## 2014-08-04 DIAGNOSIS — C50311 Malignant neoplasm of lower-inner quadrant of right female breast: Secondary | ICD-10-CM

## 2014-08-04 LAB — CBC WITH DIFFERENTIAL/PLATELET
BASO%: 0.9 % (ref 0.0–2.0)
Basophils Absolute: 0 10*3/uL (ref 0.0–0.1)
EOS%: 7.3 % — ABNORMAL HIGH (ref 0.0–7.0)
Eosinophils Absolute: 0.3 10*3/uL (ref 0.0–0.5)
HEMATOCRIT: 33.2 % — AB (ref 34.8–46.6)
HGB: 10.7 g/dL — ABNORMAL LOW (ref 11.6–15.9)
LYMPH#: 0.9 10*3/uL (ref 0.9–3.3)
LYMPH%: 18.8 % (ref 14.0–49.7)
MCH: 28.5 pg (ref 25.1–34.0)
MCHC: 32.2 g/dL (ref 31.5–36.0)
MCV: 88.3 fL (ref 79.5–101.0)
MONO#: 0.5 10*3/uL (ref 0.1–0.9)
MONO%: 12 % (ref 0.0–14.0)
NEUT#: 2.8 10*3/uL (ref 1.5–6.5)
NEUT%: 61 % (ref 38.4–76.8)
PLATELETS: 287 10*3/uL (ref 145–400)
RBC: 3.76 10*6/uL (ref 3.70–5.45)
RDW: 15.7 % — ABNORMAL HIGH (ref 11.2–14.5)
WBC: 4.5 10*3/uL (ref 3.9–10.3)

## 2014-08-04 LAB — COMPREHENSIVE METABOLIC PANEL (CC13)
ALBUMIN: 3.6 g/dL (ref 3.5–5.0)
ALT: 29 U/L (ref 0–55)
AST: 33 U/L (ref 5–34)
Alkaline Phosphatase: 125 U/L (ref 40–150)
Anion Gap: 11 mEq/L (ref 3–11)
BUN: 45.5 mg/dL — ABNORMAL HIGH (ref 7.0–26.0)
CALCIUM: 11.6 mg/dL — AB (ref 8.4–10.4)
CHLORIDE: 106 meq/L (ref 98–109)
CO2: 26 mEq/L (ref 22–29)
Creatinine: 1.8 mg/dL — ABNORMAL HIGH (ref 0.6–1.1)
EGFR: 31 mL/min/{1.73_m2} — ABNORMAL LOW (ref >90)
GLUCOSE: 108 mg/dL (ref 70–140)
Potassium: 4.4 mEq/L (ref 3.5–5.1)
Sodium: 142 mEq/L (ref 136–145)
Total Bilirubin: 0.65 mg/dL (ref 0.20–1.20)
Total Protein: 7.6 g/dL (ref 6.4–8.3)
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
## 2014-08-22 ENCOUNTER — Encounter (HOSPITAL_BASED_OUTPATIENT_CLINIC_OR_DEPARTMENT_OTHER): Payer: Self-pay | Admitting: Emergency Medicine

## 2014-08-22 ENCOUNTER — Other Ambulatory Visit: Payer: Self-pay | Admitting: Oncology

## 2014-08-22 ENCOUNTER — Emergency Department (HOSPITAL_BASED_OUTPATIENT_CLINIC_OR_DEPARTMENT_OTHER): Payer: Commercial Managed Care - HMO

## 2014-08-22 ENCOUNTER — Other Ambulatory Visit (HOSPITAL_BASED_OUTPATIENT_CLINIC_OR_DEPARTMENT_OTHER): Payer: Self-pay

## 2014-08-22 ENCOUNTER — Inpatient Hospital Stay (HOSPITAL_BASED_OUTPATIENT_CLINIC_OR_DEPARTMENT_OTHER)
Admission: EM | Admit: 2014-08-22 | Discharge: 2014-08-25 | DRG: 193 | Disposition: A | Discharge status: Home / Self Care | Payer: Commercial Managed Care - HMO | Attending: Internal Medicine | Admitting: Internal Medicine

## 2014-08-22 DIAGNOSIS — N179 Acute kidney failure, unspecified: Secondary | ICD-10-CM | POA: Diagnosis present

## 2014-08-22 DIAGNOSIS — J209 Acute bronchitis, unspecified: Secondary | ICD-10-CM | POA: Diagnosis present

## 2014-08-22 DIAGNOSIS — J90 Pleural effusion, not elsewhere classified: Secondary | ICD-10-CM

## 2014-08-22 DIAGNOSIS — D472 Monoclonal gammopathy: Secondary | ICD-10-CM | POA: Diagnosis present

## 2014-08-22 DIAGNOSIS — R778 Other specified abnormalities of plasma proteins: Secondary | ICD-10-CM | POA: Diagnosis present

## 2014-08-22 DIAGNOSIS — Z681 Body mass index (BMI) 19 or less, adult: Secondary | ICD-10-CM | POA: Diagnosis not present

## 2014-08-22 DIAGNOSIS — R7989 Other specified abnormal findings of blood chemistry: Secondary | ICD-10-CM | POA: Diagnosis not present

## 2014-08-22 DIAGNOSIS — N183 Chronic kidney disease, stage 3 unspecified: Secondary | ICD-10-CM | POA: Diagnosis present

## 2014-08-22 DIAGNOSIS — N184 Chronic kidney disease, stage 4 (severe): Secondary | ICD-10-CM | POA: Diagnosis present

## 2014-08-22 DIAGNOSIS — E1122 Type 2 diabetes mellitus with diabetic chronic kidney disease: Secondary | ICD-10-CM | POA: Diagnosis present

## 2014-08-22 DIAGNOSIS — Z85118 Personal history of other malignant neoplasm of bronchus and lung: Secondary | ICD-10-CM | POA: Diagnosis not present

## 2014-08-22 DIAGNOSIS — R0602 Shortness of breath: Secondary | ICD-10-CM | POA: Diagnosis present

## 2014-08-22 DIAGNOSIS — D509 Iron deficiency anemia, unspecified: Secondary | ICD-10-CM | POA: Diagnosis present

## 2014-08-22 DIAGNOSIS — K5900 Constipation, unspecified: Secondary | ICD-10-CM | POA: Diagnosis present

## 2014-08-22 DIAGNOSIS — E785 Hyperlipidemia, unspecified: Secondary | ICD-10-CM | POA: Diagnosis present

## 2014-08-22 DIAGNOSIS — E010 Iodine-deficiency related diffuse (endemic) goiter: Secondary | ICD-10-CM | POA: Diagnosis present

## 2014-08-22 DIAGNOSIS — I130 Hypertensive heart and chronic kidney disease with heart failure and stage 1 through stage 4 chronic kidney disease, or unspecified chronic kidney disease: Secondary | ICD-10-CM | POA: Diagnosis present

## 2014-08-22 DIAGNOSIS — E1129 Type 2 diabetes mellitus with other diabetic kidney complication: Secondary | ICD-10-CM

## 2014-08-22 DIAGNOSIS — J189 Pneumonia, unspecified organism: Principal | ICD-10-CM | POA: Diagnosis present

## 2014-08-22 DIAGNOSIS — K219 Gastro-esophageal reflux disease without esophagitis: Secondary | ICD-10-CM | POA: Diagnosis present

## 2014-08-22 DIAGNOSIS — E042 Nontoxic multinodular goiter: Secondary | ICD-10-CM | POA: Diagnosis present

## 2014-08-22 DIAGNOSIS — M810 Age-related osteoporosis without current pathological fracture: Secondary | ICD-10-CM | POA: Diagnosis present

## 2014-08-22 DIAGNOSIS — Z7901 Long term (current) use of anticoagulants: Secondary | ICD-10-CM | POA: Diagnosis not present

## 2014-08-22 DIAGNOSIS — Z9013 Acquired absence of bilateral breasts and nipples: Secondary | ICD-10-CM | POA: Diagnosis present

## 2014-08-22 DIAGNOSIS — I272 Other secondary pulmonary hypertension: Secondary | ICD-10-CM | POA: Diagnosis present

## 2014-08-22 DIAGNOSIS — Z87891 Personal history of nicotine dependence: Secondary | ICD-10-CM | POA: Diagnosis not present

## 2014-08-22 DIAGNOSIS — I480 Paroxysmal atrial fibrillation: Secondary | ICD-10-CM

## 2014-08-22 DIAGNOSIS — Z853 Personal history of malignant neoplasm of breast: Secondary | ICD-10-CM | POA: Diagnosis not present

## 2014-08-22 DIAGNOSIS — E1121 Type 2 diabetes mellitus with diabetic nephropathy: Secondary | ICD-10-CM | POA: Diagnosis not present

## 2014-08-22 DIAGNOSIS — Z8711 Personal history of peptic ulcer disease: Secondary | ICD-10-CM | POA: Diagnosis not present

## 2014-08-22 DIAGNOSIS — Z86010 Personal history of colonic polyps: Secondary | ICD-10-CM

## 2014-08-22 DIAGNOSIS — D0512 Intraductal carcinoma in situ of left breast: Secondary | ICD-10-CM | POA: Diagnosis present

## 2014-08-22 DIAGNOSIS — I5033 Acute on chronic diastolic (congestive) heart failure: Secondary | ICD-10-CM | POA: Diagnosis present

## 2014-08-22 DIAGNOSIS — Z923 Personal history of irradiation: Secondary | ICD-10-CM

## 2014-08-22 DIAGNOSIS — R0600 Dyspnea, unspecified: Secondary | ICD-10-CM | POA: Insufficient documentation

## 2014-08-22 DIAGNOSIS — E43 Unspecified severe protein-calorie malnutrition: Secondary | ICD-10-CM | POA: Diagnosis present

## 2014-08-22 DIAGNOSIS — Z9889 Other specified postprocedural states: Secondary | ICD-10-CM

## 2014-08-22 DIAGNOSIS — Z90710 Acquired absence of both cervix and uterus: Secondary | ICD-10-CM

## 2014-08-22 LAB — CBC WITH DIFFERENTIAL/PLATELET
Basophils Absolute: 0.1 10*3/uL (ref 0.0–0.1)
Basophils Relative: 1 % (ref 0–1)
Eosinophils Absolute: 0.2 10*3/uL (ref 0.0–0.7)
Eosinophils Relative: 3 % (ref 0–5)
HCT: 31.4 % — ABNORMAL LOW (ref 36.0–46.0)
Hemoglobin: 10.1 g/dL — ABNORMAL LOW (ref 12.0–15.0)
Lymphocytes Relative: 13 % (ref 12–46)
Lymphs Abs: 0.7 10*3/uL (ref 0.7–4.0)
MCH: 28.3 pg (ref 26.0–34.0)
MCHC: 32.2 g/dL (ref 30.0–36.0)
MCV: 88 fL (ref 78.0–100.0)
Monocytes Absolute: 1 10*3/uL (ref 0.1–1.0)
Monocytes Relative: 18 % — ABNORMAL HIGH (ref 3–12)
Neutro Abs: 3.7 10*3/uL (ref 1.7–7.7)
Neutrophils Relative %: 65 % (ref 43–77)
Platelets: 295 10*3/uL (ref 150–400)
RBC: 3.57 MIL/uL — ABNORMAL LOW (ref 3.87–5.11)
RDW: 15.2 % (ref 11.5–15.5)
WBC: 5.7 10*3/uL (ref 4.0–10.5)

## 2014-08-22 LAB — STREP PNEUMONIAE URINARY ANTIGEN: STREP PNEUMO URINARY ANTIGEN: NEGATIVE

## 2014-08-22 LAB — GLUCOSE, CAPILLARY
GLUCOSE-CAPILLARY: 87 mg/dL (ref 65–99)
Glucose-Capillary: 102 mg/dL — ABNORMAL HIGH (ref 65–99)
Glucose-Capillary: 115 mg/dL — ABNORMAL HIGH (ref 65–99)

## 2014-08-22 LAB — BASIC METABOLIC PANEL
Anion gap: 7 (ref 5–15)
BUN: 40 mg/dL — ABNORMAL HIGH (ref 6–20)
CO2: 28 mmol/L (ref 22–32)
CREATININE: 1.62 mg/dL — AB (ref 0.44–1.00)
Calcium: 10.8 mg/dL — ABNORMAL HIGH (ref 8.9–10.3)
Chloride: 103 mmol/L (ref 101–111)
GFR calc Af Amer: 34 mL/min — ABNORMAL LOW (ref >60)
GFR calc non Af Amer: 29 mL/min — ABNORMAL LOW (ref >60)
GLUCOSE: 99 mg/dL (ref 65–99)
Potassium: 3.8 mmol/L (ref 3.5–5.1)
Sodium: 138 mmol/L (ref 135–145)

## 2014-08-22 LAB — TROPONIN I: Troponin I: 0.34 ng/mL — ABNORMAL HIGH (ref <0.031)

## 2014-08-22 MED ORDER — SODIUM CHLORIDE 0.9 % IJ SOLN
3.0000 mL | Freq: Two times a day (BID) | INTRAMUSCULAR | Status: DC
  Administered 2014-08-22 – 2014-08-23 (×2): 3 mL via INTRAVENOUS

## 2014-08-22 MED ORDER — SODIUM CHLORIDE 0.9 % IV SOLN
250.0000 mL | INTRAVENOUS | Status: DC | PRN
Start: 2014-08-22 — End: 2014-08-25

## 2014-08-22 MED ORDER — ACETAMINOPHEN 500 MG PO TABS
500.0000 mg | ORAL_TABLET | Freq: Four times a day (QID) | ORAL | Status: DC | PRN
  Administered 2014-08-25: 500 mg via ORAL
  Filled 2014-08-22: qty 1

## 2014-08-22 MED ORDER — ATORVASTATIN CALCIUM 40 MG PO TABS
40.0000 mg | ORAL_TABLET | Freq: Every day | ORAL | Status: DC
  Administered 2014-08-22 – 2014-08-23 (×2): 40 mg via ORAL
  Filled 2014-08-22 (×2): qty 1

## 2014-08-22 MED ORDER — RIVAROXABAN 15 MG PO TABS
15.0000 mg | ORAL_TABLET | Freq: Every day | ORAL | Status: DC
  Administered 2014-08-22: 15 mg via ORAL
  Filled 2014-08-22: qty 1

## 2014-08-22 MED ORDER — CEFTRIAXONE SODIUM IN DEXTROSE 20 MG/ML IV SOLN
1.0000 g | INTRAVENOUS | Status: DC
  Administered 2014-08-22 – 2014-08-23 (×2): 1 g via INTRAVENOUS
  Filled 2014-08-22 (×2): qty 50

## 2014-08-22 MED ORDER — INSULIN ASPART 100 UNIT/ML ~~LOC~~ SOLN
0.0000 [IU] | Freq: Three times a day (TID) | SUBCUTANEOUS | Status: DC
  Administered 2014-08-23: 2 [IU] via SUBCUTANEOUS

## 2014-08-22 MED ORDER — SODIUM CHLORIDE 0.9 % IJ SOLN: 3.0000 mL | INTRAMUSCULAR | Status: DC | PRN

## 2014-08-22 MED ORDER — ANASTROZOLE 1 MG PO TABS
1.0000 mg | ORAL_TABLET | Freq: Every day | ORAL | Status: DC
  Administered 2014-08-22 – 2014-08-25 (×4): 1 mg via ORAL
  Filled 2014-08-22 (×6): qty 1

## 2014-08-22 MED ORDER — ADULT MULTIVITAMIN W/MINERALS CH
1.0000 | ORAL_TABLET | ORAL | Status: DC
  Administered 2014-08-23 – 2014-08-25 (×2): 1 via ORAL
  Filled 2014-08-22 (×3): qty 1

## 2014-08-22 MED ORDER — METOPROLOL TARTRATE 50 MG PO TABS
50.0000 mg | ORAL_TABLET | Freq: Two times a day (BID) | ORAL | Status: DC
  Administered 2014-08-22 – 2014-08-23 (×3): 50 mg via ORAL
  Filled 2014-08-22 (×3): qty 1

## 2014-08-22 MED ORDER — ENSURE ENLIVE PO LIQD
237.0000 mL | Freq: Two times a day (BID) | ORAL | Status: DC
  Administered 2014-08-22 – 2014-08-24 (×4): 237 mL via ORAL

## 2014-08-22 MED ORDER — RIVAROXABAN 20 MG PO TABS: 20.0000 mg | ORAL_TABLET | Freq: Every day | ORAL | Status: DC

## 2014-08-22 MED ORDER — DEXTROSE 5 % IV SOLN
500.0000 mg | INTRAVENOUS | Status: DC
  Administered 2014-08-22 – 2014-08-23 (×2): 500 mg via INTRAVENOUS
  Filled 2014-08-22 (×2): qty 500

## 2014-08-22 MED ORDER — CYCLOSPORINE 0.05 % OP EMUL
1.0000 [drp] | Freq: Two times a day (BID) | OPHTHALMIC | Status: DC | PRN
  Filled 2014-08-22: qty 1

## 2014-08-22 MED ORDER — ASPIRIN EC 81 MG PO TBEC
81.0000 mg | DELAYED_RELEASE_TABLET | Freq: Every day | ORAL | Status: DC
  Administered 2014-08-22 – 2014-08-23 (×2): 81 mg via ORAL
  Filled 2014-08-22 (×2): qty 1

## 2014-08-22 NOTE — ED Notes (Addendum)
Productive cough since Saturday, yellow sputum.  No known fever but some chills.  Some sob after coughing a while but not more with walking.  Some difficulty lying down.  No severe increase in peripheral edema according to patient.

## 2014-08-22 NOTE — ED Notes (Signed)
Resting SpO2 96% HR 82, RR 18. After ambulating approx. 245f HR 97, Spo2 remained the same how increase WOB noted. To 28.  However pt denies any SOB. RN notified.

## 2014-08-22 NOTE — ED Notes (Signed)
MD at bedside. 

## 2014-08-22 NOTE — H&P (Signed)
[facility] History and Physical  Alison Gross ZOX:[id number] DOB: 08-Nov-1932 DOA: 08/22/2014  Referring physician: Dr. Ralene Bathe PCP: Maximino Greenland, MD   Chief Complaint: SOB, cough  HPI: Alison Gross is a 81 y.o. female  With history of MGUS, DCIS of left breast, stage III CKD, paroxysmal atrial fibrillation on anticoagulation xarelto. Presenting with approximately 4 days complaint of shortness of breath and cough. Patient states that she tried some Lasix but did not find any improvement with the Lasix. She's states that she has been coughing up yellow phlegm and this has been progressively getting worse. Problem has been persistent since onset and has gradually getting worse. As a result patient presented to the ED for further evaluation recommendations. Patient denies any sick contacts although she states that she has been at church and there may have been some sick people there.  While in the ED patient had chest x-ray which reported no new cardiopulmonary abnormality. Of note patient had elevated troponin of 0.34. Currently denies any chest pain   Review of Systems:  Constitutional:  No weight loss, night sweats, Fevers, chills, fatigue.  HEENT:  No headaches, Difficulty swallowing,Tooth/dental problems,Sore throat,  No sneezing, itching, ear ache, nasal congestion, post nasal drip,  Cardio-vascular:  No chest pain, Orthopnea, PND, swelling in lower extremities, anasarca, dizziness, palpitations  GI:  No heartburn, indigestion, abdominal pain, nausea, vomiting, diarrhea, change in bowel habits, loss of appetite  Resp:  + shortness of breath with exertion or at rest. + excess mucus, + productive cough, No non-productive cough, No coughing up of blood.+ change in color of mucus.No wheezing.No chest wall deformity  Skin:  no rash or lesions.  GU:  no dysuria, change in color of urine, no urgency or frequency. No flank pain.  Musculoskeletal:  No joint pain or swelling. No  decreased range of motion. No back pain.  Psych:  No change in mood or affect. No depression or anxiety. No memory loss.   Past Medical History  Diagnosis Date  . Hypercholesterolemia     takes Crestor daily  . Thyroid disease     had iodine radiation  . Hypertension     takes Hyzaar daily  . Dysrhythmia     takes Carvedilol daily  . Pneumonia     history of;last time about 4-46yr ago  . GERD (gastroesophageal reflux disease)     takes Protonix daily  . Gastric ulcer   . History of blood transfusion   . History of colon polyps   . Anemia     takes iron pill daily  . Diabetes mellitus without complication     takes Tradjenta daily  . History of radiation therapy 07/12/12-08/26/12    right breast/  . Paroxysmal atrial fibrillation     PCP EKG 09/27/2013: A. Fibrillation.. EKG 09/29/2013: S. Tach.  . Breast cancer 04/12/12    right-pos lymph node/left-DCIS  . Shortness of breath on exertion   . Bruit of left carotid artery   . Acute upper GI bleeding 01/24/2012  . MGUS (monoclonal gammopathy of unknown significance) 04/21/2013  . Osteoporosis 11/29/2013  . Recurrent right pleural effusion 07/26/2014  . Stage III chronic kidney disease 07/26/2014  . Goiter 07/27/2014   Past Surgical History  Procedure Laterality Date  . Carpal tunnel release      left  . Breast biopsy    . Esophagogastroduodenoscopy  01/24/2012    Procedure: ESOPHAGOGASTRODUODENOSCOPY (EGD);  Surgeon: MJeryl Columbia MD;  [facility];  Service:  Endoscopy;  Laterality: N/A;  . Thyroid goiter removed    . Cyst removed from back of neck    . Knee surgery      right with rods  . Esophagogastroduodenoscopy    . Colonoscopy    . Total mastectomy Bilateral 05/10/2012    Procedure: RIGHT modified mastectomy; LEFT total mastectomy;  Surgeon: [name], MD;  Location: [place];  Service: General;  Laterality: Bilateral;  . Axillary sentinel node biopsy Right 05/10/2012    Procedure: AXILLARY SENTINEL lymph   NODE BIOPSY;  Surgeon: [name], MD;  Location: [place];  Service: General;  Laterality: Right;  nuclear medicine injection right side  7:00 am   . Abdominal hysterectomy      fibroids, with bilateral SO   Social History:  reports that she quit smoking about 43 years ago. Her smoking use included Cigarettes. She smoked 0.00 packs per day. She has never used smokeless tobacco. She reports that she does not drink alcohol or use illicit drugs.  No Known Allergies  Family History  Problem Relation Age of Onset  . Brain cancer Mother   . Aneurysm Father   . Diabetes Sister   . Diabetes Brother   . Diabetes Sister   . Diabetes Brother   . Heart failure Sister     Prior to Admission medications   Medication Sig Start Date End Date Taking? Authorizing Provider  acetaminophen (TYLENOL) 500 MG tablet Take 500 mg by mouth every 6 (six) hours as needed for moderate pain or headache.   Yes Historical Provider, MD  anastrozole (ARIMIDEX) 1 MG tablet TAKE 1 TABLET (1 MG TOTAL) BY MOUTH DAILY. Patient taking differently: Take 1 mg by mouth daily with breakfast.  07/18/14  Yes Laurie Panda, NP  aspirin EC 81 MG EC tablet Take 1 tablet (81 mg total) by mouth daily. 07/28/14  Yes Venetia Maxon Rama, MD  atorvastatin (LIPITOR) 40 MG tablet Take 1 tablet (40 mg total) by mouth daily at 6 PM. 07/28/14  Yes Venetia Maxon Rama, MD  Cholecalciferol (VITAMIN D) 2000 UNITS CAPS Take 2,000 Units by mouth every other day.    Yes Historical Provider, MD  cycloSPORINE (RESTASIS) 0.05 % ophthalmic emulsion Place 1 drop into both eyes 2 (two) times daily as needed (dry eyes).   Yes Historical Provider, MD  feeding supplement, ENSURE ENLIVE, (ENSURE ENLIVE) LIQD Take 237 mLs by mouth 2 (two) times daily between meals. 07/28/14  Yes Christina P Rama, MD  linagliptin (TRADJENTA) 5 MG TABS tablet Take 5 mg by mouth daily.   Yes Historical Provider, MD  losartan-hydrochlorothiazide (HYZAAR) 100-12.5 MG per tablet Take  1 tablet by mouth daily.   Yes Historical Provider, MD  metoprolol (LOPRESSOR) 50 MG tablet Take 50 mg by mouth 2 (two) times daily. 01/05/14  Yes Historical Provider, MD  Multiple Vitamin (MULTIVITAMIN WITH MINERALS) TABS Take 1 tablet by mouth every other day.    Yes Historical Provider, MD  XARELTO 20 MG TABS tablet Take 20 mg by mouth at bedtime.  01/01/14  Yes Historical Provider, MD  Amino Acids-Protein Hydrolys (FEEDING SUPPLEMENT, PRO-STAT SUGAR FREE 64,) LIQD Take 30 mLs by mouth 2 (two) times daily. Patient not taking: Reported on 08/04/2014 07/28/14   Venetia Maxon Rama, MD  furosemide (LASIX) 40 MG tablet Take 1 tablet (40 mg total) by mouth daily as needed. Patient not taking: Reported on 08/04/2014 07/28/14   Venetia Maxon Rama, MD   Physical Exam: Danley Danker Vitals:  08/22/14 0905 08/22/14 1000 08/22/14 1047 08/22/14 1148  BP: 121/59 139/63 128/72 125/60  Pulse: 82 86 82 86  Temp:   97.9 F (36.6 C) 99 F (37.2 C)  TempSrc:   Oral Oral  Resp: '18 26 20 20  '$ Height:    '5\' 5"'$  (1.651 m)  Weight:    51.438 kg (113 lb 6.4 oz)  SpO2: 98% 95% 96% 99%    Wt Readings from Last 3 Encounters:  08/22/14 51.438 kg (113 lb 6.4 oz)  08/04/14 53.162 kg (117 lb 3.2 oz)  07/28/14 51.665 kg (113 lb 14.4 oz)    General:  Appears calm and comfortable Eyes: PERRL, normal lids, irises & conjunctiva ENT: grossly normal hearing, lips & tongue Neck: no LAD, masses or thyromegaly Cardiovascular: RRR, no m/r/g. No LE edema. Respiratory: equal chest rise, rhales at bases, no wheezes Abdomen: soft, nt, nd Skin: no rash or induration seen on limited exam Musculoskeletal: grossly normal tone BUE/BLE, no chest pain on palpation Psychiatric: grossly normal mood and affect, speech fluent and appropriate Neurologic: Answers questions appropriately, moves extremities equally .          Labs on Admission:  Basic Metabolic Panel:  Recent Labs Lab 08/22/14 0830  NA 138  K 3.8  CL 103  CO2 28  GLUCOSE  99  BUN 40*  CREATININE 1.62*  CALCIUM 10.8*   Liver Function Tests: No results for input(s): AST, ALT, ALKPHOS, BILITOT, PROT, ALBUMIN in the last 168 hours. No results for input(s): LIPASE, AMYLASE in the last 168 hours. No results for input(s): AMMONIA in the last 168 hours. CBC:  Recent Labs Lab 08/22/14 0830  WBC 5.7  NEUTROABS 3.7  HGB 10.1*  HCT 31.4*  MCV 88.0  PLT 295   Cardiac Enzymes:  Recent Labs Lab 08/22/14 0830  TROPONINI 0.34*    BNP (last 3 results)  Recent Labs  07/26/14 1010  BNP 1007.4*    ProBNP (last 3 results) No results for input(s): PROBNP in the last 8760 hours.  CBG: No results for input(s): GLUCAP in the last 168 hours.  Radiological Exams on Admission: Dg Chest 2 View  08/22/2014   CLINICAL DATA:  81 year old female with current history of large substernal goiter, pleural effusion. Productive cough shortness of breath and chills for 3 days. Initial encounter.  EXAM: CHEST  2 VIEW  COMPARISON:  08/04/2014 and earlier.  FINDINGS: Abnormal thoracic inlet and right mediastinal contours seen related to massive thyromegaly on prior chest CT and PET-CT. Stable mediastinal contours. Stable mass effect on the subglottic trachea.  Superimposed moderate right pleural effusion persists with associated patchy adjacent lung opacity. No superimposed pneumothorax or pulmonary edema. No definite left pleural effusion. Postoperative changes to the axilla and chest wall. Stable visualized osseous structures.  IMPRESSION: Stable, with moderate right pleural effusion, and massive thyromegaly with mediastinal extension.  No new cardiopulmonary abnormality identified.   Electronically Signed   By: Genevie Ann M.D.   On: 08/22/2014 09:03    EKG: Independently reviewed. Normal sinus rhythm with no ST elevations or depressions similar to last EKG on 07/31/2014  Assessment/Plan Principal Problem:   PNA (pneumonia) -Routine pneumonia order set placed will await for  lab results - Place on antibiotics  Active Problems:   Elevated troponin -Consulted cardiology -EKG unchanged when compared to last and no chest pain currently. May be due to demand ischemia - Monitor on telemetry    MGUS (monoclonal gammopathy of unknown significance) -Patient to follow-up with  oncology after discharge    Ductal carcinoma in situ (DCIS) of left breast -Patient to continue routine follow-up with oncologist after discharge otherwise continue home medication regimen    Stage III chronic kidney disease -Will hold ARB - We'll also hold Lasix - Reassess serum creatinine next a.m.    Paroxysmal atrial fibrillation -Xarelto dose will have to be adjusted secondary to CKD but discussed with pharmacy    Type 2 diabetes mellitus with renal manifestations -Diabetic diet -Hold home medication and place on SSI   Code Status: full DVT Prophylaxis: xarelto Family Communication: discussed with patient and family member at bedside Disposition Plan: Telemetry  Time spent: > 55 minutes  Velvet Bathe [facility] Pager [phone]

## 2014-08-22 NOTE — ED Notes (Signed)
[facility] staff at bedside, pt care transferred. Pt and family are aware of pending transport to [facility] for admit to room 1430-1. Pt denies any c/o, states "I feel fine, honey."

## 2014-08-22 NOTE — ED Provider Notes (Signed)
CSN: [id number]     Arrival date & time 08/22/14  0736 History   First MD Initiated Contact with Patient 08/22/14 0801     Chief Complaint  Patient presents with  . Cough     Patient is a 81 y.o. female presenting with cough. The history is provided by the patient. No language interpreter was used.  Cough  Alison Gross presents for evaluation of cough.  She reports cough and increased SOB for the last three days.  The cough is productive of yellow sputum.  She is sob with exertion and lying supine.  She feels tightness across her chest.  She denies fever, abdominal pain, nausea, leg swelling or pain.  She has occasional post tussive emesis.  She has a hx/o pleural effusion and took a lasix on Saturday without improvement in sxs.  She takes xarelto for afib and has a hx/o MGUS and breast cancer.    Past Medical History  Diagnosis Date  . Hypercholesterolemia     takes Crestor daily  . Thyroid disease     had iodine radiation  . Hypertension     takes Hyzaar daily  . Dysrhythmia     takes Carvedilol daily  . Pneumonia     history of;last time about 4-40yr ago  . GERD (gastroesophageal reflux disease)     takes Protonix daily  . Gastric ulcer   . History of blood transfusion   . History of colon polyps   . Anemia     takes iron pill daily  . Diabetes mellitus without complication     takes Tradjenta daily  . History of radiation therapy 07/12/12-08/26/12    right breast/  . Paroxysmal atrial fibrillation     PCP EKG 09/27/2013: A. Fibrillation.. EKG 09/29/2013: S. Tach.  . Breast cancer 04/12/12    right-pos lymph node/left-DCIS  . Shortness of breath on exertion   . Bruit of left carotid artery   . Acute upper GI bleeding 01/24/2012  . MGUS (monoclonal gammopathy of unknown significance) 04/21/2013  . Osteoporosis 11/29/2013  . Recurrent right pleural effusion 07/26/2014  . Stage III chronic kidney disease 07/26/2014  . Goiter 07/27/2014   Past Surgical History  Procedure  Laterality Date  . Carpal tunnel release      left  . Breast biopsy    . Esophagogastroduodenoscopy  01/24/2012    Procedure: ESOPHAGOGASTRODUODENOSCOPY (EGD);  Surgeon: MJeryl Columbia MD;  [facility];  Service: Endoscopy;  Laterality: N/A;  . Thyroid goiter removed    . Cyst removed from back of neck    . Knee surgery      right with rods  . Esophagogastroduodenoscopy    . Colonoscopy    . Total mastectomy Bilateral 05/10/2012    Procedure: RIGHT modified mastectomy; LEFT total mastectomy;  Surgeon: CHaywood Lasso MD;  [facility] [facility]  Service: General;  Laterality: Bilateral;  . Axillary sentinel node biopsy Right 05/10/2012    Procedure: AXILLARY SENTINEL lymph  NODE BIOPSY;  Surgeon: CHaywood Lasso MD;  [facility] [facility]  Service: General;  Laterality: Right;  nuclear medicine injection right side  7:00 am   . Abdominal hysterectomy      fibroids, with bilateral SO   Family History  Problem Relation Age of Onset  . Brain cancer Mother   . Aneurysm Father   . Diabetes Sister   . Diabetes Brother   . Diabetes Sister   . Diabetes Brother   . Heart failure Sister  History  Substance Use Topics  . Smoking status: Former Smoker -- 0.00 packs/day    Types: Cigarettes    Quit date: 07/26/1971  . Smokeless tobacco: Never Used  . Alcohol Use: No   OB History    No data available     Review of Systems  Respiratory: Positive for cough.   All other systems reviewed and are negative.     Allergies  Review of patient's allergies indicates no known allergies.  Home Medications   Prior to Admission medications   Medication Sig Start Date End Date Taking? Authorizing Provider  Amino Acids-Protein Hydrolys (FEEDING SUPPLEMENT, PRO-STAT SUGAR FREE 64,) LIQD Take 30 mLs by mouth 2 (two) times daily. Patient not taking: Reported on 08/04/2014 07/28/14   Venetia Maxon Rama, MD  anastrozole (ARIMIDEX) 1 MG tablet TAKE 1 TABLET (1 MG TOTAL) BY MOUTH DAILY. Patient  taking differently: Take 1 mg by mouth daily with breakfast.  07/18/14   Laurie Panda, NP  aspirin EC 81 MG EC tablet Take 1 tablet (81 mg total) by mouth daily. 07/28/14   Venetia Maxon Rama, MD  atorvastatin (LIPITOR) 40 MG tablet Take 1 tablet (40 mg total) by mouth daily at 6 PM. 07/28/14   Venetia Maxon Rama, MD  Cholecalciferol (VITAMIN D) 2000 UNITS CAPS Take 2,000 Units by mouth every other day.     Historical Provider, MD  feeding supplement, ENSURE ENLIVE, (ENSURE ENLIVE) LIQD Take 237 mLs by mouth 2 (two) times daily between meals. 07/28/14   Venetia Maxon Rama, MD  furosemide (LASIX) 40 MG tablet Take 1 tablet (40 mg total) by mouth daily as needed. Patient not taking: Reported on 08/04/2014 07/28/14   Venetia Maxon Rama, MD  linagliptin (TRADJENTA) 5 MG TABS tablet Take 5 mg by mouth daily.    Historical Provider, MD  losartan-hydrochlorothiazide (HYZAAR) 100-12.5 MG per tablet Take 1 tablet by mouth daily.    Historical Provider, MD  metoprolol (LOPRESSOR) 50 MG tablet Take 50 mg by mouth 2 (two) times daily. 01/05/14   Historical Provider, MD  Multiple Vitamin (MULTIVITAMIN WITH MINERALS) TABS Take 1 tablet by mouth every other day.     Historical Provider, MD  XARELTO 20 MG TABS tablet Take 20 mg by mouth at bedtime.  01/01/14   Historical Provider, MD   BP 145/57 mmHg  Pulse 72  Temp(Src) 98.5 F (36.9 C) (Oral)  Resp 20  Ht 5' 5.5" (1.664 m)  Wt 110 lb (49.896 kg)  BMI 18.02 kg/m2  SpO2 97% Physical Exam  Constitutional: She is oriented to person, place, and time. She appears well-developed and well-nourished.  HENT:  Head: Normocephalic and atraumatic.  Cardiovascular: Normal rate and regular rhythm.   No murmur heard. Pulmonary/Chest: Effort normal. No respiratory distress.  Decreased air movement in right lung base  Abdominal: Soft. There is no tenderness. There is no rebound and no guarding.  Musculoskeletal: She exhibits no edema or tenderness.  Neurological: She is  alert and oriented to person, place, and time.  Skin: Skin is warm and dry.  Psychiatric: She has a normal mood and affect. Her behavior is normal.  Nursing note and vitals reviewed.   ED Course  Procedures (including critical care time) Labs Review Labs Reviewed  BASIC METABOLIC PANEL - Abnormal; Notable for the following:    BUN 40 (*)    Creatinine, Ser 1.62 (*)    Calcium 10.8 (*)    GFR calc non Af Amer 29 (*)  GFR calc Af Amer 34 (*)    All other components within normal limits  CBC WITH DIFFERENTIAL/PLATELET - Abnormal; Notable for the following:    RBC 3.57 (*)    Hemoglobin 10.1 (*)    HCT 31.4 (*)    Monocytes Relative 18 (*)    All other components within normal limits  TROPONIN I - Abnormal; Notable for the following:    Troponin I 0.34 (*)    All other components within normal limits  CULTURE, BLOOD (ROUTINE X 2)  CULTURE, BLOOD (ROUTINE X 2)  CULTURE, EXPECTORATED SPUTUM-ASSESSMENT  GRAM STAIN  GLUCOSE, CAPILLARY  HIV ANTIBODY (ROUTINE TESTING)  LEGIONELLA ANTIGEN, URINE  STREP PNEUMONIAE URINARY ANTIGEN    Imaging Review Dg Chest 2 View  08/22/2014   CLINICAL DATA:  81 year old female with current history of large substernal goiter, pleural effusion. Productive cough shortness of breath and chills for 3 days. Initial encounter.  EXAM: CHEST  2 VIEW  COMPARISON:  08/04/2014 and earlier.  FINDINGS: Abnormal thoracic inlet and right mediastinal contours seen related to massive thyromegaly on prior chest CT and PET-CT. Stable mediastinal contours. Stable mass effect on the subglottic trachea.  Superimposed moderate right pleural effusion persists with associated patchy adjacent lung opacity. No superimposed pneumothorax or pulmonary edema. No definite left pleural effusion. Postoperative changes to the axilla and chest wall. Stable visualized osseous structures.  IMPRESSION: Stable, with moderate right pleural effusion, and massive thyromegaly with mediastinal  extension.  No new cardiopulmonary abnormality identified.   Electronically Signed   By: Genevie Ann M.D.   On: 08/22/2014 09:03     EKG Interpretation   Date/Time:  Tuesday August 22 2014 08:29:33 EDT Ventricular Rate:  83 PR Interval:  162 QRS Duration: 84 QT Interval:  340 QTC Calculation: 399 R Axis:   -72 Text Interpretation:  Normal sinus rhythm Possible Left atrial enlargement  Pulmonary disease pattern Left anterior fascicular block Septal infarct ,  age undetermined No significant change since last tracing Abnormal ECG  Confirmed by Hazle Coca [phone]) on 08/22/2014 8:39:28 AM      MDM   Final diagnoses:  None     patient here for evaluation of shortness of breath, has history of breast cancer and CHF. Patient does have an elevated troponin of unclear significance, she has a history of elevated troponins in the past. There is no evidence of volume overload on exam. Patient conservatively symptomatic. There is a large pleural effusion on chest x-ray this is stable compared to priors. Family would like to explore thoracentesis to see if this will improve her dyspnea. Discussed with hospitalist over at [facility] long, plan to transfer for further management and workup.    Quintella Reichert, MD 08/22/14 [phone]

## 2014-08-22 NOTE — ED Notes (Signed)
Patient transported to X-ray 

## 2014-08-22 NOTE — Consult Note (Signed)
Cardiologist:  Einar Gip Reason for Consult: Elevated troponin Referring Physician:  DAISHA FILOSA is an 81 y.o. female.  HPI:   The patient is a 81 yo female with a history of chronic diastolic dysfunction, PAF-on Xarelto, hypercholesterolemia, thyroid disease, hypertension, dysrhythmia, GERD, anemia, diabetes mellitus, upper GI bleed, stage III chronic kidney disease, recurrent right pleural effusion, breast cancer.  She had echocardiogram in May of this year which revealed an ejection fraction of 60-65% with grade 2 diastolic dysfunction, quadricuspid aortic valve mild AI,small free-flowing pericardial effusion.  Patient presented with productive cough and shortness of breath.  EKG shows inferior T-wave flattening,  with T-wave inversions in V5 and 6 which is similar to prior EKG, left atrial enlargement, septal Q waves.  Chest x-ray shows moderate right pleural effusion massive thyromegaly with mediastinal extension.  She has a a troponin of 0.34.  She reports no CP but does get tired toward the end of a one mile walk.  She had some chills but denies N, V, ABD pain, orthopnea, LEE.  She also reports that Dr. Einar Gip did a stress test on her about two years ago.   Past Medical History  Diagnosis Date  . Hypercholesterolemia     takes Crestor daily  . Thyroid disease     had iodine radiation  . Hypertension     takes Hyzaar daily  . Dysrhythmia     takes Carvedilol daily  . Pneumonia     history of;last time about 4-8yr ago  . GERD (gastroesophageal reflux disease)     takes Protonix daily  . Gastric ulcer   . History of blood transfusion   . History of colon polyps   . Anemia     takes iron pill daily  . Diabetes mellitus without complication     takes Tradjenta daily  . History of radiation therapy 07/12/12-08/26/12    right breast/  . Paroxysmal atrial fibrillation     PCP EKG 09/27/2013: A. Fibrillation.. EKG 09/29/2013: S. Tach.  . Breast cancer 04/12/12    right-pos lymph  node/left-DCIS  . Shortness of breath on exertion   . Bruit of left carotid artery   . Acute upper GI bleeding 01/24/2012  . MGUS (monoclonal gammopathy of unknown significance) 04/21/2013  . Osteoporosis 11/29/2013  . Recurrent right pleural effusion 07/26/2014  . Stage III chronic kidney disease 07/26/2014  . Goiter 07/27/2014    Past Surgical History  Procedure Laterality Date  . Carpal tunnel release      left  . Breast biopsy    . Esophagogastroduodenoscopy  01/24/2012    Procedure: ESOPHAGOGASTRODUODENOSCOPY (EGD);  Surgeon: MJeryl Columbia MD;  [facility];  Service: Endoscopy;  Laterality: N/A;  . Thyroid goiter removed    . Cyst removed from back of neck    . Knee surgery      right with rods  . Esophagogastroduodenoscopy    . Colonoscopy    . Total mastectomy Bilateral 05/10/2012    Procedure: RIGHT modified mastectomy; LEFT total mastectomy;  Surgeon: CHaywood Lasso MD;  [facility]  Service: General;  Laterality: Bilateral;  . Axillary sentinel node biopsy Right 05/10/2012    Procedure: AXILLARY SENTINEL lymph  NODE BIOPSY;  Surgeon: CHaywood Lasso MD;  Location[facility]  Service: General;  Laterality: Right;  nuclear medicine injection right side  7:00 am   . Abdominal hysterectomy      fibroids, with bilateral SO    Family History  Problem Relation Age of Onset  . Brain cancer Mother   . Aneurysm Father   . Diabetes Sister   . Diabetes Brother   . Diabetes Sister   . Diabetes Brother   . Heart failure Sister     Social History:  reports that she quit smoking about 43 years ago. Her smoking use included Cigarettes. She smoked 0.00 packs per day. She has never used smokeless tobacco. She reports that she does not drink alcohol or use illicit drugs.  Allergies: No Known Allergies  Medications:  Scheduled Meds: . anastrozole  1 mg Oral Q breakfast  . aspirin EC  81 mg Oral Daily  . atorvastatin  40 mg Oral q1800  . azithromycin  500 mg  Intravenous Q24H  . cefTRIAXone (ROCEPHIN)  IV  1 g Intravenous Q24H  . feeding supplement (ENSURE ENLIVE)  237 mL Oral BID BM  . insulin aspart  0-15 Units Subcutaneous TID WC  . metoprolol  50 mg Oral BID  . [START ON 08/23/2014] multivitamin with minerals  1 tablet Oral QODAY  . rivaroxaban  15 mg Oral QHS  . sodium chloride  3 mL Intravenous Q12H   Continuous Infusions:  PRN Meds:.sodium chloride, acetaminophen, cycloSPORINE, sodium chloride   Results for orders placed or performed during the hospital encounter of 08/22/14 (from the past 48 hour(s))  Basic metabolic panel     Status: Abnormal   Collection Time: 08/22/14  8:30 AM  Result Value Ref Range   Sodium 138 135 - 145 mmol/L   Potassium 3.8 3.5 - 5.1 mmol/L   Chloride 103 101 - 111 mmol/L   CO2 28 22 - 32 mmol/L   Glucose, Bld 99 65 - 99 mg/dL   BUN 40 (H) 6 - 20 mg/dL   Creatinine, Ser 1.62 (H) 0.44 - 1.00 mg/dL   Calcium 10.8 (H) 8.9 - 10.3 mg/dL   GFR calc non Af Amer 29 (L) >60 mL/min   GFR calc Af Amer 34 (L) >60 mL/min    Comment: (NOTE) The eGFR has been calculated using the CKD EPI equation. This calculation has not been validated in all clinical situations. eGFR's persistently <60 mL/min signify possible Chronic Kidney Disease.    Anion gap 7 5 - 15  CBC with Differential     Status: Abnormal   Collection Time: 08/22/14  8:30 AM  Result Value Ref Range   WBC 5.7 4.0 - 10.5 K/uL   RBC 3.57 (L) 3.87 - 5.11 MIL/uL   Hemoglobin 10.1 (L) 12.0 - 15.0 g/dL   HCT 31.4 (L) 36.0 - 46.0 %   MCV 88.0 78.0 - 100.0 fL   MCH 28.3 26.0 - 34.0 pg   MCHC 32.2 30.0 - 36.0 g/dL   RDW 15.2 11.5 - 15.5 %   Platelets 295 150 - 400 K/uL   Neutrophils Relative % 65 43 - 77 %   Neutro Abs 3.7 1.7 - 7.7 K/uL   Lymphocytes Relative 13 12 - 46 %   Lymphs Abs 0.7 0.7 - 4.0 K/uL   Monocytes Relative 18 (H) 3 - 12 %   Monocytes Absolute 1.0 0.1 - 1.0 K/uL   Eosinophils Relative 3 0 - 5 %   Eosinophils Absolute 0.2 0.0 - 0.7  K/uL   Basophils Relative 1 0 - 1 %   Basophils Absolute 0.1 0.0 - 0.1 K/uL  Troponin I     Status: Abnormal   Collection Time: 08/22/14  8:30 AM  Result Value Ref  Range   Troponin I 0.34 (H) <0.031 ng/mL    Comment:        PERSISTENTLY INCREASED TROPONIN VALUES IN THE RANGE OF 0.04-0.49 ng/mL CAN BE SEEN IN:       -UNSTABLE ANGINA       -CONGESTIVE HEART FAILURE       -MYOCARDITIS       -CHEST TRAUMA       -ARRYHTHMIAS       -LATE PRESENTING MYOCARDIAL INFARCTION       -COPD   CLINICAL FOLLOW-UP RECOMMENDED.   Glucose, capillary     Status: None   Collection Time: 08/22/14  2:51 PM  Result Value Ref Range   Glucose-Capillary 87 65 - 99 mg/dL    Dg Chest 2 View  08/22/2014   CLINICAL DATA:  81 year old female with current history of large substernal goiter, pleural effusion. Productive cough shortness of breath and chills for 3 days. Initial encounter.  EXAM: CHEST  2 VIEW  COMPARISON:  08/04/2014 and earlier.  FINDINGS: Abnormal thoracic inlet and right mediastinal contours seen related to massive thyromegaly on prior chest CT and PET-CT. Stable mediastinal contours. Stable mass effect on the subglottic trachea.  Superimposed moderate right pleural effusion persists with associated patchy adjacent lung opacity. No superimposed pneumothorax or pulmonary edema. No definite left pleural effusion. Postoperative changes to the axilla and chest wall. Stable visualized osseous structures.  IMPRESSION: Stable, with moderate right pleural effusion, and massive thyromegaly with mediastinal extension.  No new cardiopulmonary abnormality identified.   Electronically Signed   By: Genevie Ann M.D.   On: 08/22/2014 09:03    Review of Systems  Constitutional: Positive for chills. Negative for fever and diaphoresis.  HENT: Positive for congestion.   Respiratory: Positive for cough and shortness of breath.   Cardiovascular: Negative for chest pain, orthopnea and leg swelling.  Gastrointestinal:  Negative for nausea, vomiting and abdominal pain.  Musculoskeletal: Negative for myalgias.  Neurological: Negative for dizziness.  All other systems reviewed and are negative.  Blood pressure 112/53, pulse 82, temperature 99.5 F (37.5 C), temperature source Oral, resp. rate 20, height 5' 5"  (1.651 m), weight 113 lb 6.4 oz (51.438 kg), SpO2 98 %. Physical Exam  Nursing note and vitals reviewed. Constitutional: She is oriented to person, place, and time. She appears well-developed and well-nourished. No distress.  HENT:  Head: Normocephalic and atraumatic.  Eyes: Conjunctivae are normal. Pupils are equal, round, and reactive to light. No scleral icterus.  Neck: Normal range of motion.  Cardiovascular: Normal rate, regular rhythm, S1 normal and S2 normal.   No murmur heard. Pulses:      Radial pulses are 2+ on the right side, and 2+ on the left side.       Dorsalis pedis pulses are 2+ on the right side, and 2+ on the left side.  Respiratory: Effort normal. She has no wheezes. She has no rales.  Decreased BS on the right base  GI: Soft. Bowel sounds are normal. She exhibits no distension. There is no tenderness.  Musculoskeletal: She exhibits no edema.  Neurological: She is alert and oriented to person, place, and time. She exhibits normal muscle tone.  Skin: Skin is warm and dry.  Psychiatric: She has a normal mood and affect.    Assessment/Plan: Principal Problem:   PNA (pneumonia) Active Problems:   MGUS (monoclonal gammopathy of unknown significance)   Ductal carcinoma in situ (DCIS) of left breast   Elevated troponin   Stage III  chronic kidney disease   Paroxysmal atrial fibrillation   Type 2 diabetes mellitus with renal manifestations  Elevated troponin likely demand ischemia in the setting on acute illness.  She denies CP.  EKG changes similar to previous.  It sounds like she has pretty good exercise tolerance and gets tired after about a mile of walking, but no chest pain.   Echo last month with normal EF, G2DD.  Recommend OP stress testing as discussed with Dr. Radford Pax.  She appears euvolemic.    Tarri Fuller, [place] 08/22/2014, 4:01 PM

## 2014-08-22 NOTE — Progress Notes (Signed)
81 year old lady with h/o hypertension, PAF, breast cancer and MGUS, presents [facility] sob , was found to have right pleural effusion, . Dr Ralene Bathe requesting admission to the hospital for evaluation of the SOB and possible thoracocentesis.    Hosie Poisson, MD [phone]

## 2014-08-23 DIAGNOSIS — D472 Monoclonal gammopathy: Secondary | ICD-10-CM

## 2014-08-23 DIAGNOSIS — J189 Pneumonia, unspecified organism: Principal | ICD-10-CM

## 2014-08-23 LAB — IRON AND TIBC
IRON: 27 ug/dL — AB (ref 28–170)
Saturation Ratios: 9 % — ABNORMAL LOW (ref 10.4–31.8)
TIBC: 288 ug/dL (ref 250–450)
UIBC: 261 ug/dL

## 2014-08-23 LAB — BASIC METABOLIC PANEL
Anion gap: 8 (ref 5–15)
BUN: 42 mg/dL — AB (ref 6–20)
CO2: 27 mmol/L (ref 22–32)
Calcium: 10.5 mg/dL — ABNORMAL HIGH (ref 8.9–10.3)
Chloride: 102 mmol/L (ref 101–111)
Creatinine, Ser: 1.61 mg/dL — ABNORMAL HIGH (ref 0.44–1.00)
GFR, EST AFRICAN AMERICAN: 34 mL/min — AB (ref >60)
GFR, EST NON AFRICAN AMERICAN: 29 mL/min — AB (ref >60)
GLUCOSE: 94 mg/dL (ref 65–99)
Potassium: 3.8 mmol/L (ref 3.5–5.1)
Sodium: 137 mmol/L (ref 135–145)

## 2014-08-23 LAB — GLUCOSE, CAPILLARY
GLUCOSE-CAPILLARY: 89 mg/dL (ref 65–99)
GLUCOSE-CAPILLARY: 145 mg/dL — AB (ref 65–99)
Glucose-Capillary: 95 mg/dL (ref 65–99)
Glucose-Capillary: 84 mg/dL (ref 65–99)

## 2014-08-23 LAB — CBC
HCT: 27 % — ABNORMAL LOW (ref 36.0–46.0)
HEMOGLOBIN: 8.5 g/dL — AB (ref 12.0–15.0)
MCH: 27.1 pg (ref 26.0–34.0)
MCHC: 31.5 g/dL (ref 30.0–36.0)
MCV: 86 fL (ref 78.0–100.0)
PLATELETS: 252 10*3/uL (ref 150–400)
RBC: 3.14 MIL/uL — AB (ref 3.87–5.11)
RDW: 15.4 % (ref 11.5–15.5)
WBC: 4.7 10*3/uL (ref 4.0–10.5)

## 2014-08-23 LAB — FOLATE: Folate: 33 ng/mL (ref >5.9)

## 2014-08-23 LAB — LEGIONELLA ANTIGEN, URINE

## 2014-08-23 LAB — TROPONIN I: TROPONIN I: 0.29 ng/mL — AB (ref <0.031)

## 2014-08-23 LAB — FERRITIN: Ferritin: 18 ng/mL (ref 11–307)

## 2014-08-23 LAB — VITAMIN B12: Vitamin B-12: 325 pg/mL (ref 180–914)

## 2014-08-23 LAB — TSH: TSH: 1.115 u[IU]/mL (ref 0.350–4.500)

## 2014-08-23 LAB — PROCALCITONIN: PROCALCITONIN: 0.14 ng/mL

## 2014-08-23 LAB — HIV ANTIBODY (ROUTINE TESTING W REFLEX): HIV Screen 4th Generation wRfx: NONREACTIVE

## 2014-08-23 MED ORDER — PIPERACILLIN-TAZOBACTAM 3.375 G IVPB
3.3750 g | Freq: Three times a day (TID) | INTRAVENOUS | Status: DC
Start: 2014-08-23 — End: 2014-08-25
  Administered 2014-08-23 – 2014-08-25 (×5): 3.375 g via INTRAVENOUS
  Filled 2014-08-23 (×7): qty 50

## 2014-08-23 MED ORDER — VANCOMYCIN HCL IN DEXTROSE 1-5 GM/200ML-% IV SOLN
1000.0000 mg | Freq: Once | INTRAVENOUS | Status: AC
  Administered 2014-08-23: 1000 mg via INTRAVENOUS
  Filled 2014-08-23: qty 200

## 2014-08-23 MED ORDER — FUROSEMIDE 10 MG/ML IJ SOLN
40.0000 mg | Freq: Every day | INTRAMUSCULAR | Status: DC
Start: 2014-08-23 — End: 2014-08-24
  Administered 2014-08-23: 40 mg via INTRAVENOUS
  Filled 2014-08-23: qty 4

## 2014-08-23 MED ORDER — VANCOMYCIN HCL 500 MG IV SOLR
500.0000 mg | INTRAVENOUS | Status: DC
  Administered 2014-08-24: 500 mg via INTRAVENOUS
  Filled 2014-08-23: qty 500

## 2014-08-23 MED ORDER — FERROUS SULFATE 325 (65 FE) MG PO TABS
325.0000 mg | ORAL_TABLET | Freq: Three times a day (TID) | ORAL | Status: DC
  Administered 2014-08-23 – 2014-08-25 (×5): 325 mg via ORAL
  Filled 2014-08-23 (×5): qty 1

## 2014-08-23 MED ORDER — METOPROLOL TARTRATE 25 MG PO TABS
12.5000 mg | ORAL_TABLET | Freq: Two times a day (BID) | ORAL | Status: DC
  Administered 2014-08-23: 12.5 mg via ORAL
  Filled 2014-08-23 (×2): qty 1

## 2014-08-23 NOTE — Progress Notes (Signed)
ANTIBIOTIC CONSULT NOTE - INITIAL  Pharmacy Consult for Vancomycin, Zosyn Indication: HCAP  No Known Allergies  Patient Measurements: Height: '5\' 5"'$  (165.1 cm) Weight: 113 lb 6.4 oz (51.438 kg) IBW/kg (Calculated) : 57  Vital Signs: Temp: 98.4 F (36.9 C) (06/15 1438) Temp Source: Oral (06/15 1438) BP: 97/50 mmHg (06/15 1800) Pulse Rate: 78 (06/15 1800) Intake/Output from previous day: 06/14 0701 - 06/15 0700 In: 243 [P.O.:240; I.V.:3] Out: -  Intake/Output from this shift:    Labs:  Recent Labs  08/22/14 0830 08/23/14 0426  WBC 5.7 4.7  HGB 10.1* 8.5*  PLT 295 252  CREATININE 1.62* 1.61*   Estimated Creatinine Clearance: 23 mL/min (by C-G formula based on Cr of 1.61). No results for input(s): VANCOTROUGH, VANCOPEAK, VANCORANDOM, GENTTROUGH, GENTPEAK, GENTRANDOM, TOBRATROUGH, TOBRAPEAK, TOBRARND, AMIKACINPEAK, AMIKACINTROU, AMIKACIN in the last 72 hours.   Microbiology: Recent Results (from the past 720 hour(s))  MRSA PCR Screening     Status: None   Collection Time: 07/26/14  1:47 PM  Result Value Ref Range Status   MRSA by PCR NEGATIVE NEGATIVE Final    Comment:        The GeneXpert MRSA Assay (FDA approved for NASAL specimens only), is one component of a comprehensive MRSA colonization surveillance program. It is not intended to diagnose MRSA infection nor to guide or monitor treatment for MRSA infections.   Culture, blood (routine x 2) Call MD if unable to obtain prior to antibiotics being given     Status: None (Preliminary result)   Collection Time: 08/22/14  3:00 PM  Result Value Ref Range Status   Specimen Description BLOOD LEFT HAND  Final   Special Requests BOTTLES DRAWN AEROBIC ONLY 10CC  Final   Culture   Final           BLOOD CULTURE RECEIVED NO GROWTH TO DATE CULTURE WILL BE HELD FOR 5 DAYS BEFORE ISSUING A FINAL NEGATIVE REPORT Performed at [facility]    Report Status PENDING  Incomplete  Culture, blood (routine x 2) Call MD if  unable to obtain prior to antibiotics being given     Status: None (Preliminary result)   Collection Time: 08/22/14  3:20 PM  Result Value Ref Range Status   Specimen Description BLOOD LEFT ARM  Final   Special Requests BOTTLES DRAWN AEROBIC AND ANAEROBIC 10CC  Final   Culture   Final           BLOOD CULTURE RECEIVED NO GROWTH TO DATE CULTURE WILL BE HELD FOR 5 DAYS BEFORE ISSUING A FINAL NEGATIVE REPORT Performed at [facility]    Report Status PENDING  Incomplete    Medical History: Past Medical History  Diagnosis Date  . Hypercholesterolemia     takes Crestor daily  . Thyroid disease     had iodine radiation  . Hypertension     takes Hyzaar daily  . Dysrhythmia     takes Carvedilol daily  . Pneumonia     history of;last time about 4-20yr ago  . GERD (gastroesophageal reflux disease)     takes Protonix daily  . Gastric ulcer   . History of blood transfusion   . History of colon polyps   . Anemia     takes iron pill daily  . Diabetes mellitus without complication     takes Tradjenta daily  . History of radiation therapy 07/12/12-08/26/12    right breast/  . Paroxysmal atrial fibrillation     PCP EKG 09/27/2013: A.  Fibrillation.. EKG 09/29/2013: S. Tach.  . Breast cancer 04/12/12    right-pos lymph node/left-DCIS  . Shortness of breath on exertion   . Bruit of left carotid artery   . Acute upper GI bleeding 01/24/2012  . MGUS (monoclonal gammopathy of unknown significance) 04/21/2013  . Osteoporosis 11/29/2013  . Recurrent right pleural effusion 07/26/2014  . Stage III chronic kidney disease 07/26/2014  . Goiter 07/27/2014   Assessment: 66 y/oF with PMH of MGUS, breast cancer, CKD stage III, PAF who presented to [facility] ED 6/14 with SOB and cough. Patient initially placed on Rocephin and Zithromax, now transitioned to Vancomycin and Zosyn for HCAP coverage given recent hospitalization.  6/14 >> CTX >> 6/15 6/14 >> Zithromax >> 6/15 6/15 >> Vancomycin >>  6/15 >> Zosyn  >>    6/14 blood x 2: NGTD 6/14 Legionella urinary antigen: negative 6/14 Strep pneumo urinary antigen: negative  Renal: SCr 1.61 with CrCl ~ 23 ml/min CG WBC: WNL PCT: 0.14  Goal of Therapy:  Vancomycin trough level 15-20 mcg/ml  Appropriate antibiotic dosing for renal function and indication Eradication of infection  Plan:   Vancomycin 1g IV x 1, then '500mg'$  IV q24h.  Plan for Vancomycin trough level at steady state. Zosyn 3.375g IV q8h (infuse over 4 hours) Monitor renal function, cultures, clinical course.   Lindell Spar, PharmD, BCPS Pager: [phone] 08/23/2014 7:11 PM

## 2014-08-23 NOTE — Progress Notes (Signed)
[facility] PROGRESS NOTE  [name] KXF:[id number] DOB: September 03, 1932 DOA: 08/22/2014 PCP: Maximino Greenland, MD  Brief Summary  Alison Gross is a 81 y.o. female with history of MGUS, DCIS of left breast, stage III CKD, paroxysmal atrial fibrillation on xarelto who presented with 4 days of shortness of breath and cough. Patient tried some Lasix but did not find any improvement with the Lasix. She's states that she has been coughing up yellow phlegm and this has been progressively getting worse. Problem has been persistent since onset and has gradually getting worse. As a result patient presented to the ED for further evaluation recommendations. Patient denies any sick contacts although she states that she has been at church and there may have been some sick people there.  While in the ED patient had chest x-ray which reported no new cardiopulmonary abnormality. Of note patient had elevated troponin of 0.34.   Assessment/Plan  Possible HCAP -  Check procalcitonin -  Change to empiric HCAP coverage secondary to recent hospital admission -  HIV, legionella and s. pneumo negative -  F/u blood cultures -  Low threshold to d/c antibiotics  Chronic right pleural effusion, present since at least 2014 -  Plan for US guided thoracentesis tomorrow per Dr. Einar Gip  Acute on chronic diastolic heart failure - Grade 2 diastolic dysfunction, concentric hypertrophy and pulmonary hypertension. -  Effusion on CXR -  Daily weights and strict I/O -  Lasix IV per cardiology  Iron deficiency anemia -  Start iron supplementation -  B12, folate, TSH wnl -  F/u occult stool  Hypercalcemia - Noted dating back to 01/18/14. Bone scan negative for metastatic disease. - PTH low at 10 - check vitamin D and PTHrp  Hypertension with hypotenstion - continue to hold HCTZ/ARB -  Reduce metoprolol  Dyslipidemia -  Continue lipitor  Type II Diabetes with renal manifestations - Controlled on  Tradjenta. - Hemoglobin A1c 6.2%.  Paroxysmal atrial fibrillation - Currently in normal sinus rhythm. -  Hold Xarelto -  BB reduced due to hypotension   Primary cancer of lower-inner quadrant of right female breast - Continue Arimidex.   MGUS (monoclonal gammopathy of unknown significance) - Monitor renal function.   Weight loss, unintentional / severe protein calorie malnutrition - Dietitian consultation requested. - TSH is 1.036, WNL. - Need to consider recurrent malignancy.   Recurrent right pleural effusion - May need to consider thoracentesis.   Elevated troponin - chronically elevated due to heart failure and CKD   Stage III chronic kidney disease - Stable.   Goiter, stable on recent US - TSH WNL. Has a history of goiter in the past treated with surgical removal and RAI. Prior pathology showed thyroiditis.   Diet:  Low  Access:  PIV IVF:  off Proph:  xarelto on hold   Code Status: full Family Communication: patient alone Disposition Plan: pending further improvement in dyspnea   Consultants:  Cardiology  Procedures:  CXR  Antibiotics:  Ceftriaxone 6/14 >> 6/15  Azithromycin 6/14 >> 6/15   Vancomycin 6/15 >>   Zosyn 6/15 >>  HPI/Subjective:  States she feels much better since arriving at the hospital.  She has had several coughing spell sand coughed up thick white sputum.  Since then, she has felt much better.  Denies chest pains, wheeze, SOB even when walking to the bathroom.    Objective: Filed Vitals:   08/22/14 2152 08/23/14 0459 08/23/14 1047 08/23/14 1438  BP: 102/52 97/55  85/41  Pulse:  77  76  Temp:  98.5 F (36.9 C)  98.4 F (36.9 C)  TempSrc:  Oral  Oral  Resp:  20  20  Height:      Weight:      SpO2:  97% 100% 98%    Intake/Output Summary (Last 24 hours) at 08/23/14 1657 Last data filed at 08/23/14 1440  Gross per 24 hour  Intake    603 ml  Output      0 ml  Net    603 ml   Filed Weights   08/22/14 0742  08/22/14 1148  Weight: 49.896 kg (110 lb) 51.438 kg (113 lb 6.4 oz)    Exam:   General:  Cachectic appearing F, No acute distress  HEENT:  NCAT, MMM  Cardiovascular:  RRR,  4/6 systolic murmur heard at LSB, 2+ pulses, warm extremities  Respiratory:  Diminished at the right base, no increased WOB  Abdomen:   NABS, soft, NT/ND  MSK:   Normal tone and bulk, no LEE  Neuro:  Grossly intact  Data Reviewed: Basic Metabolic Panel:  Recent Labs Lab 08/22/14 0830 08/23/14 0426  NA 138 137  K 3.8 3.8  CL 103 102  CO2 28 27  GLUCOSE 99 94  BUN 40* 42*  CREATININE 1.62* 1.61*  CALCIUM 10.8* 10.5*   Liver Function Tests: No results for input(s): AST, ALT, ALKPHOS, BILITOT, PROT, ALBUMIN in the last 168 hours. No results for input(s): LIPASE, AMYLASE in the last 168 hours. No results for input(s): AMMONIA in the last 168 hours. CBC:  Recent Labs Lab 08/22/14 0830 08/23/14 0426  WBC 5.7 4.7  NEUTROABS 3.7  --   HGB 10.1* 8.5*  HCT 31.4* 27.0*  MCV 88.0 86.0  PLT 295 252   Cardiac Enzymes:  Recent Labs Lab 08/22/14 0830 08/23/14 0822  TROPONINI 0.34* 0.29*   BNP (last 3 results)  Recent Labs  07/26/14 1010  BNP 1007.4*    ProBNP (last 3 results) No results for input(s): PROBNP in the last 8760 hours.  CBG:  Recent Labs Lab 08/22/14 1451 08/22/14 1830 08/22/14 2135 08/23/14 0756 08/23/14 1144  GLUCAP 87 102* 115* 95 89    Recent Results (from the past 240 hour(s))  Culture, blood (routine x 2) Call MD if unable to obtain prior to antibiotics being given     Status: None (Preliminary result)   Collection Time: 08/22/14  3:00 PM  Result Value Ref Range Status   Specimen Description BLOOD LEFT HAND  Final   Special Requests BOTTLES DRAWN AEROBIC ONLY 10CC  Final   Culture   Final           BLOOD CULTURE RECEIVED NO GROWTH TO DATE CULTURE WILL BE HELD FOR 5 DAYS BEFORE ISSUING A FINAL NEGATIVE REPORT Performed at [facility]    Report  Status PENDING  Incomplete  Culture, blood (routine x 2) Call MD if unable to obtain prior to antibiotics being given     Status: None (Preliminary result)   Collection Time: 08/22/14  3:20 PM  Result Value Ref Range Status   Specimen Description BLOOD LEFT ARM  Final   Special Requests BOTTLES DRAWN AEROBIC AND ANAEROBIC 10CC  Final   Culture   Final           BLOOD CULTURE RECEIVED NO GROWTH TO DATE CULTURE WILL BE HELD FOR 5 DAYS BEFORE ISSUING A FINAL NEGATIVE REPORT Performed at [facility]    Report Status  PENDING  Incomplete     Studies: Dg Chest 2 View  08/22/2014   CLINICAL DATA:  81 year old female with current history of large substernal goiter, pleural effusion. Productive cough shortness of breath and chills for 3 days. Initial encounter.  EXAM: CHEST  2 VIEW  COMPARISON:  08/04/2014 and earlier.  FINDINGS: Abnormal thoracic inlet and right mediastinal contours seen related to massive thyromegaly on prior chest CT and PET-CT. Stable mediastinal contours. Stable mass effect on the subglottic trachea.  Superimposed moderate right pleural effusion persists with associated patchy adjacent lung opacity. No superimposed pneumothorax or pulmonary edema. No definite left pleural effusion. Postoperative changes to the axilla and chest wall. Stable visualized osseous structures.  IMPRESSION: Stable, with moderate right pleural effusion, and massive thyromegaly with mediastinal extension.  No new cardiopulmonary abnormality identified.   Electronically Signed   By: Genevie Ann M.D.   On: 08/22/2014 09:03    Scheduled Meds: . anastrozole  1 mg Oral Q breakfast  . atorvastatin  40 mg Oral q1800  . feeding supplement (ENSURE ENLIVE)  237 mL Oral BID BM  . ferrous sulfate  325 mg Oral TID WC  . furosemide  40 mg Intravenous Daily  . insulin aspart  0-15 Units Subcutaneous TID WC  . metoprolol tartrate  12.5 mg Oral BID  . multivitamin with minerals  1 tablet Oral QODAY  . sodium  chloride  3 mL Intravenous Q12H   Continuous Infusions:   Principal Problem:   PNA (pneumonia) Active Problems:   MGUS (monoclonal gammopathy of unknown significance)   Ductal carcinoma in situ (DCIS) of left breast   Elevated troponin   Stage III chronic kidney disease   Paroxysmal atrial fibrillation   Type 2 diabetes mellitus with renal manifestations    Time spent: 30 min    Cherylene Ferrufino, [facility] Pager [phone]. If 7PM-7AM, please contact night-coverage at [email or web address], password [facility] 08/23/2014, 4:57 PM  LOS: 1 day

## 2014-08-23 NOTE — Consult Note (Addendum)
CARDIOLOGY CONSULT NOTE  Patient ID: Alison Gross MRN: [id number] DOB/AGE: 1932-03-16 81 y.o.  Admit date: 08/22/2014 Referring Physician:  [facility] Primary Physician:  Maximino Greenland, MD Reason for Consultation:  Elevated troponin  HPI: Alison Gross  is a 81 y.o. female with a history of paroxysmal a.fib, hypertension, diabetes mellitus, and history of breast cancer status post bilateral mastectomy who presented to the ED on 08/22/2014 with a 3 day history of cough and worsening SOB.  Chest x-ray revealed moderate right pleural effusion.  Troponin on admission was mildly elevated at 0.34, therefore, we were asked to consult.  She was previously hospitalized and September 2015 with right-sided pleural effusion requiring thoracentesis.  She was again admitted last month with acute on chronic diastolic heart failure and recurrent right pleural effusion.  She responded well with IV diuresis and was discharged home on PO Lasix as needed.  Troponin was also elevated, but flat, during this admission.  She had been feeling well until Sunday when she developed cough with occasional clear sputum production and shortness of breath.  She reports intermittent exertional chest tightness over the past several months which has not worsened.  She denies any chest pain while in the hospital. She denies any peripheral edema.  She denies any recent illness, fever, or night sweats.  She states she is feeling much better this morning.  Past Medical History  Diagnosis Date  . Hypercholesterolemia     takes Crestor daily  . Thyroid disease     had iodine radiation  . Hypertension     takes Hyzaar daily  . Dysrhythmia     takes Carvedilol daily  . Pneumonia     history of;last time about 4-86yr ago  . GERD (gastroesophageal reflux disease)     takes Protonix daily  . Gastric ulcer   . History of blood transfusion   . History of colon polyps   . Anemia     takes iron pill daily  . Diabetes  mellitus without complication     takes Tradjenta daily  . History of radiation therapy 07/12/12-08/26/12    right breast/  . Paroxysmal atrial fibrillation     PCP EKG 09/27/2013: A. Fibrillation.. EKG 09/29/2013: S. Tach.  . Breast cancer 04/12/12    right-pos lymph node/left-DCIS  . Shortness of breath on exertion   . Bruit of left carotid artery   . Acute upper GI bleeding 01/24/2012  . MGUS (monoclonal gammopathy of unknown significance) 04/21/2013  . Osteoporosis 11/29/2013  . Recurrent right pleural effusion 07/26/2014  . Stage III chronic kidney disease 07/26/2014  . Goiter 07/27/2014     Past Surgical History  Procedure Laterality Date  . Carpal tunnel release      left  . Breast biopsy    . Esophagogastroduodenoscopy  01/24/2012    Procedure: ESOPHAGOGASTRODUODENOSCOPY (EGD);  Surgeon: MJeryl Columbia MD;  [facility];  Service: Endoscopy;  Laterality: N/A;  . Thyroid goiter removed    . Cyst removed from back of neck    . Knee surgery      right with rods  . Esophagogastroduodenoscopy    . Colonoscopy    . Total mastectomy Bilateral 05/10/2012    Procedure: RIGHT modified mastectomy; LEFT total mastectomy;  Surgeon: CHaywood Lasso MD;  [facility]  Service: General;  Laterality: Bilateral;  . Axillary sentinel node biopsy Right 05/10/2012    Procedure: AXILLARY SENTINEL lymph  NODE BIOPSY;  Surgeon: CHaywood Lasso MD;  Location: [facility];  Service: General;  Laterality: Right;  nuclear medicine injection right side  7:00 am   . Abdominal hysterectomy      fibroids, with bilateral SO     Family History  Problem Relation Age of Onset  . Brain cancer Mother   . Aneurysm Father   . Diabetes Sister   . Diabetes Brother   . Diabetes Sister   . Diabetes Brother   . Heart failure Sister      Social History: History   Social History  . Marital Status: Married    Spouse Name: [name]  . Number of Children: 2  . Years of Education: N/A   Occupational  History  . Not on file.   Social History Main Topics  . Smoking status: Former Smoker -- 0.00 packs/day    Types: Cigarettes    Quit date: 07/26/1971  . Smokeless tobacco: Never Used  . Alcohol Use: No  . Drug Use: No  . Sexual Activity: Not Currently    Birth Control/ Protection: Surgical     Comment: menarche age 79,1st live birth 49, P2, no HRT   Other Topics Concern  . Not on file   Social History Narrative   Married.  Ambulates independently.       Prescriptions prior to admission  Medication Sig Dispense Refill Last Dose  . acetaminophen (TYLENOL) 500 MG tablet Take 500 mg by mouth every 6 (six) hours as needed for moderate pain or headache.   Past Week at Unknown time  . anastrozole (ARIMIDEX) 1 MG tablet TAKE 1 TABLET (1 MG TOTAL) BY MOUTH DAILY. (Patient taking differently: Take 1 mg by mouth daily with breakfast. ) 30 tablet 5 08/21/2014 at Unknown time  . aspirin EC 81 MG EC tablet Take 1 tablet (81 mg total) by mouth daily.   08/21/2014 at Unknown time  . atorvastatin (LIPITOR) 40 MG tablet Take 1 tablet (40 mg total) by mouth daily at 6 PM. 30 tablet 2 08/21/2014 at Unknown time  . Cholecalciferol (VITAMIN D) 2000 UNITS CAPS Take 2,000 Units by mouth every other day.    08/21/2014 at Unknown time  . cycloSPORINE (RESTASIS) 0.05 % ophthalmic emulsion Place 1 drop into both eyes 2 (two) times daily as needed (dry eyes).   3 weeks  . feeding supplement, ENSURE ENLIVE, (ENSURE ENLIVE) LIQD Take 237 mLs by mouth 2 (two) times daily between meals. 237 mL 12 08/21/2014 at Unknown time  . linagliptin (TRADJENTA) 5 MG TABS tablet Take 5 mg by mouth daily.   08/20/2014  . losartan-hydrochlorothiazide (HYZAAR) 100-12.5 MG per tablet Take 1 tablet by mouth daily.   08/21/2014 at Unknown time  . metoprolol (LOPRESSOR) 50 MG tablet Take 50 mg by mouth 2 (two) times daily.  6 08/21/2014 at 2000  . Multiple Vitamin (MULTIVITAMIN WITH MINERALS) TABS Take 1 tablet by mouth every other day.     08/21/2014 at Unknown time  . XARELTO 20 MG TABS tablet Take 20 mg by mouth at bedtime.   2 08/21/2014 at 2100  . Amino Acids-Protein Hydrolys (FEEDING SUPPLEMENT, PRO-STAT SUGAR FREE 64,) LIQD Take 30 mLs by mouth 2 (two) times daily. (Patient not taking: Reported on 08/04/2014) 900 mL 0 Not Taking  . furosemide (LASIX) 40 MG tablet Take 1 tablet (40 mg total) by mouth daily as needed. (Patient not taking: Reported on 08/04/2014) 30 tablet 2 Not Taking     ROS: General: no fevers/chills/night sweats Eyes: no blurry vision, diplopia, or  amaurosis ENT: no sore throat or hearing loss Resp: Reports productive cough with clear sputum, wheezing, and SOB.  No hemoptysis CV: Reports exertional chest tightness over the past several months. No PND, orthopnea (although cough is worse when laying flat), edema, or palpitations GI: no abdominal pain, nausea, vomiting, diarrhea, or constipation GU: no dysuria, frequency, or hematuria Skin: no rash Neuro: no headache, numbness, tingling, or weakness of extremities Musculoskeletal: no joint pain or swelling Heme: no bleeding, DVT, or easy bruising Endo: no polydipsia or polyuria    Physical Exam: Blood pressure 97/55, pulse 77, temperature 98.5 F (36.9 C), temperature source Oral, resp. rate 20, height '5\' 5"'$  (1.651 m), weight 51.438 kg (113 lb 6.4 oz), SpO2 97 %.   GENERAL: Moderately built and normalbody habitus, who is in no acute distress. Alert and oriented x 3. Appears stated age.  HEENT: normal limits. No JVD.  CARDIAC EXAM: S1 normal, S2 normal, III/VI long systolic murmur in apex, No gallop. Apical heave present CHEST EXAM: No tenderness of chest wall.  LUNGS: Bilateral expiratory wheezing, scattered rhonchi in bases, diminished lung sounds in RLL. ABDOMEN: No hepatosplenomegaly. BS normal in all 4 quadrants. Abdomen is non-tender.  EXTREMITY: Full range of movements, No edema.  NEUROLOGIC EXAM: Grossly intact without any focal  deficits. Alert O x 3.  VASCULAR EXAM: No skin breakdown. Carotids normal. Extremities: Femoral pulse normal. Popliteal pulse normal; Pedal pulse normal. Otherwise no bruit. No prominent pulse felt in the abdomen. No varicose veins.  Labs:   Lab Results  Component Value Date   WBC 4.7 08/23/2014   HGB 8.5* 08/23/2014   HCT 27.0* 08/23/2014   MCV 86.0 08/23/2014   PLT 252 08/23/2014    Recent Labs Lab 08/23/14 0426  NA 137  K 3.8  CL 102  CO2 27  BUN 42*  CREATININE 1.61*  CALCIUM 10.5*  GLUCOSE 94    BNP (last 3 results)  Recent Labs  07/26/14 1010  BNP 1007.4*    ProBNP (last 3 results) No results for input(s): PROBNP in the last 8760 hours.  HEMOGLOBIN A1C Lab Results  Component Value Date   HGBA1C 6.2* 07/27/2014   MPG 131 07/27/2014    Cardiac Panel (last 3 results)  Recent Labs  07/27/14 0515 08/22/14 0830 08/23/14 0822  TROPONINI 0.20* 0.34* 0.29*     TSH  Recent Labs  07/27/14 0515  TSH 1.036   EKG 08/23/2014: Sinus rhythm at a rate of 83 bpm, left axis deviation, left anterior fascicular block, poor R-wave progression, T-wave flattening in inferior leads and T-wave inversion in lateral leads, cannot exclude inferior and anterolateral infarct, old vs LVH with repol abnormality.  Pulmonary disease pattern.  No significant change from prior EKG.  Echo 07/27/2014:  Left ventricle: The cavity size was normal. There was moderate concentric hypertrophy. Speckled pattern and may suggest infiltrative cardiomyopathy or long standing hypertension. Systolic function was normal. The estimated ejection fraction was in the range of 60% to 65%. Features are consistent with a pseudonormal left ventricular filling pattern, with concomitant abnormal relaxation and increased filling pressure (grade 2 diastolic dysfunction). Doppler parameters are consistent with both elevated ventricular end-diastolic filling pressure andelevated left atrial filling  pressure. Aortic valve: Quadricuspid. There was no stenosis. Mild elevation in velocity probably due to quadracuspid AV with mild AI. There was trivial regurgitation. Valve area (VTI): 1.57 cm^2. Valve area (Vmax): 1.61 cm^2. Valve area (Vmean): 1.59 cm^2.  Mitral valve: There was mild regurgitation. Left atrium: The atrium  was mildly dilated. Atrial septum: No defect or patent foramen ovale was identified. Pericardium, extracardiac: A small, free-flowing pericardial effusion was identified. The fluid had no internal echoes.There was no evidence of hemodynamic compromise.  Outpatient Exercise Myoview stress test 10/21/2013: 1. Resting EKG showed normal sinus rhythm, poor R wave progression, non-specific ST-depression and T flattening in inferior and lateral leads, cannot R/O ischemia. Stress EKG was equivocal for non diagnostic for ischemia. There was 0.5 mm additional ST depression noted at peak exercise, which resolved at < 2 minutes into recovery. Patient exercised on BRUCE PROTOCOL for 65mnutes 00 seconds. The maximum work level achieved was 5.8 1MET's. The test was terminated due to achievement of the target heart rate.  2. Perfusion imaging study demonstrates normal perfusion without ischemia or scar. Normal left ventricular ejection fraction. Based on the stress results, continued primary prevention is recommended.   Radiology: Dg Chest 2 View  08/22/2014   CLINICAL DATA:  81year old female with current history of large substernal goiter, pleural effusion. Productive cough shortness of breath and chills for 3 days. Initial encounter.  EXAM: CHEST  2 VIEW  COMPARISON:  08/04/2014 and earlier.  FINDINGS: Abnormal thoracic inlet and right mediastinal contours seen related to massive thyromegaly on prior chest CT and PET-CT. Stable mediastinal contours. Stable mass effect on the subglottic trachea.  Superimposed moderate right pleural effusion persists with associated patchy adjacent lung opacity. No  superimposed pneumothorax or pulmonary edema. No definite left pleural effusion. Postoperative changes to the axilla and chest wall. Stable visualized osseous structures.  IMPRESSION: Stable, with moderate right pleural effusion, and massive thyromegaly with mediastinal extension.  No new cardiopulmonary abnormality identified.   Electronically Signed   By: HGenevie AnnM.D.   On: 08/22/2014 09:03    Scheduled Meds: . anastrozole  1 mg Oral Q breakfast  . aspirin EC  81 mg Oral Daily  . atorvastatin  40 mg Oral q1800  . azithromycin  500 mg Intravenous Q24H  . cefTRIAXone (ROCEPHIN)  IV  1 g Intravenous Q24H  . feeding supplement (ENSURE ENLIVE)  237 mL Oral BID BM  . insulin aspart  0-15 Units Subcutaneous TID WC  . metoprolol  50 mg Oral BID  . multivitamin with minerals  1 tablet Oral QODAY  . rivaroxaban  15 mg Oral QHS  . sodium chloride  3 mL Intravenous Q12H   Continuous Infusions:  PRN Meds:.sodium chloride, acetaminophen, cycloSPORINE, sodium chloride  ASSESSMENT AND PLAN:  1. Recurrent pleural effusion, Suspect acute on chronic diastolic heart failure 2. Elevated troponin, Nonspecific and due to probable acute on chronic diastolic heart failure 3. Acute bronchitis with productive sputum. 4. CKD Stage III 5. HTN With hypertensive heart disease. 6. DM Type II controlled 7. PAF: CHA2DS2-VASc Score is 5 with yearly risk of stroke of 6.7% per year. HAS-BLED score is 1 with bleeding risk of 1.02-1.5%  8.   Severe anemia, Reduced serum iron levels, suspect iron deficiency anemia. 9.  Massive thyromegaly.  Recommendation: Patient's symptoms are related to acute bronchitis, associated with this she has severe anemia and diastolic dysfunction contributing to acute decompensated heart failure.  Would recommend Probable correction of anemia, consider blood transfusion if symptoms do not improve.  Blood pressure is soft and borderline has stage III chronic kidney disease due to diabetes  mellitus, hence Diuresis  needs to be performed very carefully. I would also consider repeating ultrasound guided right pleurocentesis to evaluate the etiology for recurrent pleural effusion.  She has had  pleurocentesis in September 2015 which was nondiagnostic. Consideration for tuberculosis if the pleural fluid is exudative should be considered in the differential diagnosis. Patient is presently not on an ACE inhibitor or a ARB due to renal failure and borderline low blood sugar. Will Hold Xarelto today for consideration for US guided pleurocentesis.  Daimion Adamcik, [facility] Office: [phone]

## 2014-08-24 ENCOUNTER — Other Ambulatory Visit: Payer: Self-pay | Admitting: Oncology

## 2014-08-24 ENCOUNTER — Inpatient Hospital Stay (HOSPITAL_COMMUNITY): Payer: Commercial Managed Care - HMO

## 2014-08-24 DIAGNOSIS — E1121 Type 2 diabetes mellitus with diabetic nephropathy: Secondary | ICD-10-CM

## 2014-08-24 DIAGNOSIS — J90 Pleural effusion, not elsewhere classified: Secondary | ICD-10-CM

## 2014-08-24 LAB — LACTATE DEHYDROGENASE, PLEURAL OR PERITONEAL FLUID: LD FL: 49 U/L — AB (ref 3–23)

## 2014-08-24 LAB — BODY FLUID CELL COUNT WITH DIFFERENTIAL
Lymphs, Fluid: 84 %
MONOCYTE-MACROPHAGE-SEROUS FLUID: 15 % — AB (ref 50–90)
Neutrophil Count, Fluid: 1 % (ref 0–25)
Total Nucleated Cell Count, Fluid: 614 cu mm (ref 0–1000)

## 2014-08-24 LAB — GLUCOSE, SEROUS FLUID: GLUCOSE FL: 107 mg/dL

## 2014-08-24 LAB — HEPATIC FUNCTION PANEL
ALBUMIN: 3 g/dL — AB (ref 3.5–5.0)
ALK PHOS: 65 U/L (ref 38–126)
ALT: 15 U/L (ref 14–54)
AST: 24 U/L (ref 15–41)
Total Bilirubin: 0.6 mg/dL (ref 0.3–1.2)
Total Protein: 6.1 g/dL — ABNORMAL LOW (ref 6.5–8.1)

## 2014-08-24 LAB — BASIC METABOLIC PANEL
ANION GAP: 9 (ref 5–15)
BUN: 45 mg/dL — ABNORMAL HIGH (ref 6–20)
CALCIUM: 10.5 mg/dL — AB (ref 8.9–10.3)
CO2: 27 mmol/L (ref 22–32)
Chloride: 102 mmol/L (ref 101–111)
Creatinine, Ser: 1.89 mg/dL — ABNORMAL HIGH (ref 0.44–1.00)
GFR calc Af Amer: 28 mL/min — ABNORMAL LOW (ref >60)
GFR, EST NON AFRICAN AMERICAN: 24 mL/min — AB (ref >60)
GLUCOSE: 83 mg/dL (ref 65–99)
POTASSIUM: 3.6 mmol/L (ref 3.5–5.1)
SODIUM: 138 mmol/L (ref 135–145)

## 2014-08-24 LAB — GLUCOSE, CAPILLARY
GLUCOSE-CAPILLARY: 81 mg/dL (ref 65–99)
GLUCOSE-CAPILLARY: 96 mg/dL (ref 65–99)
Glucose-Capillary: 103 mg/dL — ABNORMAL HIGH (ref 65–99)
Glucose-Capillary: 126 mg/dL — ABNORMAL HIGH (ref 65–99)

## 2014-08-24 LAB — LACTATE DEHYDROGENASE: LDH: 129 U/L (ref 98–192)

## 2014-08-24 LAB — VITAMIN D 25 HYDROXY (VIT D DEFICIENCY, FRACTURES): Vit D, 25-Hydroxy: 54.3 ng/mL (ref 30.0–100.0)

## 2014-08-24 LAB — GRAM STAIN

## 2014-08-24 LAB — PROTEIN, BODY FLUID: TOTAL PROTEIN, FLUID: 3.2 g/dL

## 2014-08-24 MED ORDER — POLYETHYLENE GLYCOL 3350 17 G PO PACK: 17.0000 g | PACK | Freq: Every day | ORAL | Status: DC

## 2014-08-24 MED ORDER — POLYETHYLENE GLYCOL 3350 17 G PO PACK
17.0000 g | PACK | Freq: Two times a day (BID) | ORAL | Status: DC
  Administered 2014-08-24: 17 g via ORAL
  Filled 2014-08-24 (×2): qty 1

## 2014-08-24 MED ORDER — RIVAROXABAN 15 MG PO TABS: 15.0000 mg | ORAL_TABLET | Freq: Every day | ORAL | Status: DC

## 2014-08-24 MED ORDER — ATORVASTATIN CALCIUM 10 MG PO TABS
10.0000 mg | ORAL_TABLET | Freq: Every day | ORAL | Status: DC
  Administered 2014-08-24: 10 mg via ORAL
  Filled 2014-08-24 (×2): qty 1

## 2014-08-24 NOTE — Procedures (Signed)
Successful US guided right thoracentesis. Yielded 800 ml of serous fluid. Pt tolerated procedure well. No immediate complications.  Specimen was sent for labs. CXR ordered.  Tsosie Billing D PA-C 08/24/2014 10:38 AM

## 2014-08-24 NOTE — Progress Notes (Signed)
ANTICOAGULATION CONSULT NOTE - Initial Consult  Pharmacy Consult for Xarelto Indication: atrial fibrillation  No Known Allergies  Patient Measurements: Height: '5\' 5"'$  (165.1 cm) Weight: 108 lb 3.9 oz (49.1 kg) IBW/kg (Calculated) : 57  Vital Signs: Temp: 97.8 F (36.6 C) (06/16 1330) Temp Source: Oral (06/16 1330) BP: 85/46 mmHg (06/16 1330) Pulse Rate: 87 (06/16 1330)  Labs:  Recent Labs  08/22/14 0830 08/23/14 0426 08/23/14 0822 08/24/14 0508  HGB 10.1* 8.5*  --   --   HCT 31.4* 27.0*  --   --   PLT 295 252  --   --   CREATININE 1.62* 1.61*  --  1.89*  TROPONINI 0.34*  --  0.29*  --     Estimated Creatinine Clearance: 18.7 mL/min (by C-G formula based on Cr of 1.89).   Medical History: Past Medical History  Diagnosis Date  . Hypercholesterolemia     takes Crestor daily  . Thyroid disease     had iodine radiation  . Hypertension     takes Hyzaar daily  . Dysrhythmia     takes Carvedilol daily  . Pneumonia     history of;last time about 4-64yr ago  . GERD (gastroesophageal reflux disease)     takes Protonix daily  . Gastric ulcer   . History of blood transfusion   . History of colon polyps   . Anemia     takes iron pill daily  . Diabetes mellitus without complication     takes Tradjenta daily  . History of radiation therapy 07/12/12-08/26/12    right breast/  . Paroxysmal atrial fibrillation     PCP EKG 09/27/2013: A. Fibrillation.. EKG 09/29/2013: S. Tach.  . Breast cancer 04/12/12    right-pos lymph node/left-DCIS  . Shortness of breath on exertion   . Bruit of left carotid artery   . Acute upper GI bleeding 01/24/2012  . MGUS (monoclonal gammopathy of unknown significance) 04/21/2013  . Osteoporosis 11/29/2013  . Recurrent right pleural effusion 07/26/2014  . Stage III chronic kidney disease 07/26/2014  . Goiter 07/27/2014    Assessment: 89y/oF with PMH of MGUS, breast cancer, CKD stage III, PAF on Xarelto PTA presented with shortness of breath and  cough and was admitted for possible HCAP, chronic right pleural effusion, and acute on chronic diastolic heart failure.  Xarelto was held for thoracentesis today.  Pharmacy consulted to resume.  MD progress note indicates to resume tomorrow (6/17).  Patient currently in normal sinus rhythm.  Patient was on Xarelto 20 mg daily PTA.  SCr increased to 1.89 today (CKD stage III and received lasix).  CrCl~18 ml/min requires a dose reduction for Xarelto to 15 mg daily.  Goal of Therapy:  Prevention of stroke and systemic embolism   Plan:  Resume Xarelto at 15 mg daily tomorrow.  F/u SCr.  Recommendation is to use an alternative anticoagulant if CrCl<15 ml/min.  RHershal Coria6/16/2016,5:37 PM

## 2014-08-24 NOTE — Progress Notes (Signed)
Initial Nutrition Assessment  DOCUMENTATION CODES:  Non-severe (moderate) malnutrition in context of chronic illness  INTERVENTION: - Continue Ensure Enlive BID - RD will continue to monitor for needs  NUTRITION DIAGNOSIS:  Food and nutrition related knowledge deficit related to other (see comment) (lack of prior education) as evidenced by per patient/family report.  GOAL:  Patient will meet greater than or equal to 90% of their needs  MONITOR:  PO intake, Weight trends, Labs, I & O's  REASON FOR ASSESSMENT:  Consult Diet education  ASSESSMENT: Per H&P, 81 y.o. female with history of MGUS, DCIS of left breast, stage III CKD, paroxysmal atrial fibrillation on anticoagulation xarelto. Presenting with approximately 4 days complaint of shortness of breath and cough. Patient states that she tried some Lasix but did not find any improvement with the Lasix. She's states that she has been coughing up yellow phlegm and this has been progressively getting worse. Problem has been persistent since onset and has gradually getting worse.  Pt seen for consult for assessment and education. BMI WDL. Pt reports she has a fair appetite now and PTA and that she does not have associated nausea or abdominal pain. Ensure Jeanne Ivan has already been ordered BID and, per chart review, she is eating 50-100% at meals. Per weight hx review, pt has lost 7 lbs (6% body weight) in the past month but pt has also been on Lasix. She also had a thoracentesis this AM.  Education provided to pt and family member in the room. Provided handouts from the [employer] outlining a low sodium diet and seasoning alternatives that do no incorporate additional sodium. Talked with pt about limiting Na to 1500 mg/day. Encouraged portion control and balance of low sodium meals if she does eat high sodium foods at one meal. Pt also had a question about sweet tea with blood sugars. Encouraged her to mix half sweet  with half unsweetened iced tea or to use Splenda or some other non-sugar sweetener to make "sweet tea." Encouraged pt to talk with PCP about outpatient RD appointment should she be interested as family member was very persistent about wanting someone to make her a day-by-day menu of what she should eat at meals. Expect fair compliance regarding education.  Likely meeting needs. Medications reviewed. Labs reviewed; CBGs: 81-145 mg/dL, BUN/creatinine elevated, GFR: 28.  Height:  Ht Readings from Last 1 Encounters:  08/22/14 '5\' 5"'$  (1.651 m)    Weight:  Wt Readings from Last 1 Encounters:  08/22/14 113 lb 6.4 oz (51.438 kg)    Ideal Body Weight:  56.82 kg (kg)  Wt Readings from Last 10 Encounters:  08/22/14 113 lb 6.4 oz (51.438 kg)  08/04/14 117 lb 3.2 oz (53.162 kg)  07/28/14 113 lb 14.4 oz (51.665 kg)  07/18/14 120 lb 4.8 oz (54.568 kg)  01/25/14 126 lb 3.2 oz (57.244 kg)  11/29/13 130 lb (58.968 kg)  10/25/13 131 lb 11.2 oz (59.739 kg)  10/12/13 132 lb (59.875 kg)  07/21/13 132 lb 8 oz (60.102 kg)  07/08/13 135 lb 9.6 oz (61.508 kg)    BMI:  Body mass index is 18.87 kg/(m^2).  Estimated Nutritional Needs:  Kcal:  1300-1500  Protein:  50-60 grams  Fluid:  1.7 L/day  Skin:  Reviewed, no issues  Diet Order:  Diet heart healthy/carb modified Room service appropriate?: Yes; Fluid consistency:: Thin  EDUCATION NEEDS:  Education needs addressed   Intake/Output Summary (Last 24 hours) at 08/24/14 1528 Last data filed at  08/24/14 0700  Gross per 24 hour  Intake    220 ml  Output      0 ml  Net    220 ml    Last BM:  PTA    Jarome Matin, RD, LDN Inpatient Clinical Dietitian Pager # [phone] After hours/weekend pager # [phone]

## 2014-08-24 NOTE — Progress Notes (Signed)
[facility] PROGRESS NOTE  Alison Gross LYY:[id number] DOB: 22-Feb-1933 DOA: 08/22/2014 PCP: Maximino Greenland, MD  Brief Summary  Alison Gross is a 81 y.o. female with history of MGUS, DCIS of left breast, stage III CKD, paroxysmal atrial fibrillation on xarelto who presented with 4 days of shortness of breath and cough. Patient tried some Lasix but did not find any improvement with the Lasix. She's states that she has been coughing up yellow phlegm and this has been progressively getting worse. Problem has been persistent since onset and has gradually getting worse. As a result patient presented to the ED for further evaluation recommendations. Patient denies any sick contacts although she states that she has been at church and there may have been some sick people there.  While in the ED patient had chest x-ray which reported no new cardiopulmonary abnormality. Of note patient had elevated troponin of 0.34.   Assessment/Plan  Possible HCAP -  Procalcitonin:  0.14 -  Change to empiric HCAP coverage secondary to recent hospital admission -  HIV, legionella and s. pneumo negative -  Blood cultures NGTD -  Low threshold to d/c antibiotics   Chronic right pleural effusion, present since at least 2014 -  US guided thoracentesis performed today with removal of 86m of serous fluid -  Agree with CT chest but would wait for a day or two or do as outpatient to allow creatinine to recover so that IV contrast could safely be administered -  Check serum LDH and total protein to determine if this is exudative or transudative.  -  F/u cx -  F/u cytology  Acute on chronic diastolic heart failure - Grade 2 diastolic dysfunction, concentric hypertrophy and pulmonary hypertension. -  Effusion on CXR -  Daily weights and strict I/O  Iron deficiency anemia -  Start iron supplementation -  B12, folate, TSH wnl -  F/u occult stool  Hypercalcemia - Noted dating back to 01/18/14. Bone scan  negative for metastatic disease. - PTH low at 10 - vitamin D 54  -  F/u PTHrp  Hypertension with hypotenstion -  Continue to hold HCTZ/ARB -  Reduce metoprolol  Dyslipidemia -  Lipitor decreased by cardiology due to age and frailty.  Consider stopping altogether  Type II Diabetes with renal manifestations - Controlled on Tradjenta. - Hemoglobin A1c 6.2%.  Paroxysmal atrial fibrillation -  Currently in normal sinus rhythm. -  Hold Xarelto and resume tomorrow -  BB reduced due to hypotension  Primary cancer of lower-inner quadrant of right female breast - Continue Arimidex.  MGUS (monoclonal gammopathy of unknown significance) - Monitor renal function.  Weight loss, unintentional / severe protein calorie malnutrition - Nutrition consultation - TSH is 1.036, WNL  Elevated troponin - chronically elevated due to heart failure and CKD  Stage III chronic kidney disease, creatinine rose slightly with diuresis - d/c lasix  Goiter, stable on recent [place]- TSH WNL. Has a history of goiter in the past treated with surgical removal and RAI. Prior pathology showed thyroiditis.  Constipation -  Start miralax BID  Diet:  Low  Access:  PIV IVF:  off Proph:  xarelto on hold due to procedure  Code Status: full Family Communication: patient alone Disposition Plan:  Likely home tomorrow pending culture data and possible CT chest   Consultants:  Cardiology  Procedures:  CXR  Antibiotics:  Ceftriaxone 6/14 >> 6/15  Azithromycin 6/14 >> 6/15   Vancomycin 6/15 >>   Zosyn 6/15 >>  HPI/Subjective:  Continues to feel better.  Walked in halls without cough or SOB today with OT after she had her thoracentesis.  Constipated  Objective: Filed Vitals:   08/24/14 0447 08/24/14 1025 08/24/14 1035 08/24/14 1120  BP: '92/47 74/47 79/49 '$ 90/70  Pulse: 79     Temp: 98.3 F (36.8 C)     TempSrc: Oral     Resp:      Height:      Weight:      SpO2: 97%       Intake/Output  Summary (Last 24 hours) at 08/24/14 1335 Last data filed at 08/24/14 0700  Gross per 24 hour  Intake    460 ml  Output      0 ml  Net    460 ml   Filed Weights   08/22/14 0742 08/22/14 1148  Weight: 49.896 kg (110 lb) 51.438 kg (113 lb 6.4 oz)    Exam:   General:  Cachectic appearing F, No acute distress  HEENT:  NCAT, MMM  Cardiovascular:  RRR,  4/6 systolic murmur heard at LSB, 2+ pulses, warm extremities  Respiratory:  Rales at right base with markedly improved aeration, no increased WOB  Abdomen:   NABS, soft, moderately distended, nontender  MSK:   Normal tone and bulk, no LEE  Neuro:  Grossly intact  Data Reviewed: Basic Metabolic Panel:  Recent Labs Lab 08/22/14 0830 08/23/14 0426 08/24/14 0508  NA 138 137 138  K 3.8 3.8 3.6  CL 103 102 102  CO2 '28 27 27  '$ GLUCOSE 99 94 83  BUN 40* 42* 45*  CREATININE 1.62* 1.61* 1.89*  CALCIUM 10.8* 10.5* 10.5*   Liver Function Tests: No results for input(s): AST, ALT, ALKPHOS, BILITOT, PROT, ALBUMIN in the last 168 hours. No results for input(s): LIPASE, AMYLASE in the last 168 hours. No results for input(s): AMMONIA in the last 168 hours. CBC:  Recent Labs Lab 08/22/14 0830 08/23/14 0426  WBC 5.7 4.7  NEUTROABS 3.7  --   HGB 10.1* 8.5*  HCT 31.4* 27.0*  MCV 88.0 86.0  PLT 295 252   Cardiac Enzymes:  Recent Labs Lab 08/22/14 0830 08/23/14 0822  TROPONINI 0.34* 0.29*   BNP (last 3 results)  Recent Labs  07/26/14 1010  BNP 1007.4*    ProBNP (last 3 results) No results for input(s): PROBNP in the last 8760 hours.  CBG:  Recent Labs Lab 08/23/14 1144 08/23/14 1636 08/23/14 2141 08/24/14 0737 08/24/14 1212  GLUCAP 89 145* 84 81 96    Recent Results (from the past 240 hour(s))  Culture, blood (routine x 2) Call MD if unable to obtain prior to antibiotics being given     Status: None (Preliminary result)   Collection Time: 08/22/14  3:00 PM  Result Value Ref Range Status   Specimen  Description BLOOD LEFT HAND  Final   Special Requests BOTTLES DRAWN AEROBIC ONLY 10CC  Final   Culture   Final           BLOOD CULTURE RECEIVED NO GROWTH TO DATE CULTURE WILL BE HELD FOR 5 DAYS BEFORE ISSUING A FINAL NEGATIVE REPORT Performed at [facility]    Report Status PENDING  Incomplete  Culture, blood (routine x 2) Call MD if unable to obtain prior to antibiotics being given     Status: None (Preliminary result)   Collection Time: 08/22/14  3:20 PM  Result Value Ref Range Status   Specimen Description BLOOD LEFT ARM  Final  Special Requests BOTTLES DRAWN AEROBIC AND ANAEROBIC 10CC  Final   Culture   Final           BLOOD CULTURE RECEIVED NO GROWTH TO DATE CULTURE WILL BE HELD FOR 5 DAYS BEFORE ISSUING A FINAL NEGATIVE REPORT Performed at [facility]    Report Status PENDING  Incomplete     Studies: Dg Chest 1 View  08/24/2014   CLINICAL DATA:  Status post thoracentesis.  EXAM: CHEST  1 VIEW  COMPARISON:  08/22/2014  FINDINGS: Surgical clips about the chest bilaterally. right-sided thyroid enlargement with mild tracheal deviation left. Decrease in small right pleural effusion, without pneumothorax. A small left pleural effusion is similar. Improved patchy right lower lobe predominant atelectasis.  IMPRESSION: No pneumothorax.  Decreased right pleural effusion with persistent left tiny pleural effusion. Improved right-sided aeration.  Massive right-sided thyroid enlargement, as before.   Electronically Signed   By: Abigail Miyamoto M.D.   On: 08/24/2014 11:18   US Thoracentesis Asp Pleural Space W/img Guide  08/24/2014   INDICATION: Symptomatic right sided pleural effusion.  EXAM: US THORACENTESIS ASP PLEURAL SPACE W/IMG GUIDE  COMPARISON:  CXR 08/22/14.  MEDICATIONS: None  COMPLICATIONS: None immediate  TECHNIQUE: Informed written consent was obtained from the patient after a discussion of the risks, benefits and alternatives to treatment. A timeout was performed prior to  the initiation of the procedure.  Initial ultrasound scanning demonstrates a right pleural effusion. The lower chest was prepped and draped in the usual sterile fashion. 1% lidocaine was used for local anesthesia.  Under direct ultrasound guidance, a 19 gauge, 7-cm, Yueh catheter was introduced. An ultrasound image was saved for documentation purposes. The thoracentesis was performed. The catheter was removed and a dressing was applied. The patient tolerated the procedure well without immediate post procedural complication. The patient was escorted to have an upright chest radiograph.  FINDINGS: A total of approximately 800 ml of serous fluid was removed. Requested samples were sent to the laboratory.  IMPRESSION: Successful ultrasound-guided right sided thoracentesis yielding 800 ml of pleural fluid.  Read By:  Tsosie Billing PA-C   Electronically Signed   By: Markus Daft M.D.   On: 08/24/2014 11:54    Scheduled Meds: . anastrozole  1 mg Oral Q breakfast  . atorvastatin  10 mg Oral q1800  . feeding supplement (ENSURE ENLIVE)  237 mL Oral BID BM  . ferrous sulfate  325 mg Oral TID WC  . insulin aspart  0-15 Units Subcutaneous TID WC  . metoprolol tartrate  12.5 mg Oral BID  . multivitamin with minerals  1 tablet Oral QODAY  . piperacillin-tazobactam (ZOSYN)  IV  3.375 g Intravenous 3 times per day  . sodium chloride  3 mL Intravenous Q12H  . vancomycin  500 mg Intravenous Q24H   Continuous Infusions:   Principal Problem:   PNA (pneumonia) Active Problems:   MGUS (monoclonal gammopathy of unknown significance)   Ductal carcinoma in situ (DCIS) of left breast   Elevated troponin   Stage III chronic kidney disease   Paroxysmal atrial fibrillation   Type 2 diabetes mellitus with renal manifestations   HCAP (healthcare-associated pneumonia)    Time spent: 30 min    Esgar Barnick, [facility] Pager [phone]. If 7PM-7AM, please contact night-coverage at [email or web address], password  [facility] 08/24/2014, 1:35 PM  LOS: 2 days

## 2014-08-24 NOTE — Progress Notes (Signed)
  Occupational Therapy Evaluation Patient Details Name: Alison Gross MRN: [id number] DOB: 10/03/1932 Today's Date: 08/26/2014    History of Present Illness PNA   Clinical Impression   Pt at baseline. OT eval and education complete. No further OT needed    Follow Up Recommendations  No OT follow up    Equipment Recommendations       Recommendations for Other Services       Precautions / Restrictions Precautions Precautions: None      Mobility Bed Mobility Overal bed mobility: Independent                Transfers Overall transfer level: Independent                         ADL Overall ADL's : At baseline                                                       Pertinent Vitals/Pain Pain Assessment: No/denies pain     Hand Dominance     Extremity/Trunk Assessment Upper Extremity Assessment Upper Extremity Assessment: Overall WFL for tasks assessed           Communication     Cognition Arousal/Alertness: Awake/alert Behavior During Therapy: WFL for tasks assessed/performed Overall Cognitive Status: Within Functional Limits for tasks assessed                                Home Living Family/patient expects to be discharged to:: Private residence Living Arrangements: Spouse/significant other Available Help at Discharge: Family Type of Home: House       Home Layout: Two level     Bathroom Shower/Tub: Teacher, early years/pre: Standard     Home Equipment: None          Prior Functioning/Environment Level of Independence: Independent                      OT Goals(Current goals can be found in the care plan section) Acute Rehab OT Goals Patient Stated Goal: home with husband  OT Frequency:     Barriers to D/C:               End of Session Nurse Communication: Mobility status  Activity Tolerance: Patient tolerated treatment well Patient left: in bed;with call  bell/phone within reach;with family/visitor present   Time: 1254-1314 OT Time Calculation (min): 20 min Charges:  OT General Charges $OT Visit: 1 Procedure OT Evaluation $Initial OT Evaluation Tier I: 1 Procedure G-Codes:    Payton Mccallum D August 26, 2014, 1:20 PM

## 2014-08-24 NOTE — Progress Notes (Signed)
PT Cancellation Note  Patient Details Name: Alison Gross MRN: [id number] DOB: [birth year]   Cancelled Treatment:    Reason Eval/Treat Not Completed: PT screened, no needs identified, will sign off  Pt reports no PT needs at this time.  Pt reports she did well with OT and this PT also observed pt ambulating in hallway yesterday with NT.  PT to sign off.   Teejay Meader,KATHrine E 08/24/2014, 1:57 PM Carmelia Bake, PT, DPT 08/24/2014 Pager: [phone]

## 2014-08-24 NOTE — Progress Notes (Signed)
Subjective:  Patient states that she feels much better.  Cough is decreased.  Denies PND or orthopnea.  Objective:  Vital Signs in the last 24 hours: Temp:  [98.3 F (36.8 C)-98.5 F (36.9 C)] 98.3 F (36.8 C) (06/16 0447) Pulse Rate:  [76-79] 79 (06/16 0447) Resp:  [20] 20 (06/15 1438) BP: (85-97)/(41-50) 92/47 mmHg (06/16 0447) SpO2:  [97 %-100 %] 97 % (06/16 0447)  Intake/Output from previous day: 06/15 0701 - 06/16 0700 In: 530 [P.O.:480; IV Piggyback:50] Out: -   Physical Exam: GENERAL: Moderately built and normalbody habitus, who is in no acute distress. Alert and oriented x 3. Appears stated age.  HEENT: normal limits. No JVD.  CARDIAC EXAM: S1 normal, S2 normal, III/VI long systolic murmur in apex, S4 gallop present. Apical heave present CHEST EXAM: No tenderness of chest wall.  LUNGS: Diminished lung sounds in RLL. ABDOMEN: No hepatosplenomegaly. BS normal in all 4 quadrants. Abdomen is non-tender.  EXTREMITY: Full range of movements, No edema.  NEUROLOGIC EXAM: Grossly intact without any focal deficits. Alert O x 3.  VASCULAR EXAM: No skin breakdown. Carotids normal. Extremities: Femoral pulse normal. Popliteal pulse normal; Pedal pulse normal. Otherwise no bruit. No prominent pulse felt in the abdomen. No varicose veins.  Lab Results: BMP  Recent Labs  08/22/14 0830 08/23/14 0426 08/24/14 0508  NA 138 137 138  K 3.8 3.8 3.6  CL 103 102 102  CO2 '28 27 27  '$ GLUCOSE 99 94 83  BUN 40* 42* 45*  CREATININE 1.62* 1.61* 1.89*  CALCIUM 10.8* 10.5* 10.5*  GFRNONAA 29* 29* 24*  GFRAA 34* 34* 28*    CBC  Recent Labs Lab 08/22/14 0830 08/23/14 0426  WBC 5.7 4.7  RBC 3.57* 3.14*  HGB 10.1* 8.5*  HCT 31.4* 27.0*  PLT 295 252  MCV 88.0 86.0  MCH 28.3 27.1  MCHC 32.2 31.5  RDW 15.2 15.4  LYMPHSABS 0.7  --   MONOABS 1.0  --   EOSABS 0.2  --   BASOSABS 0.1  --     HEMOGLOBIN A1C Lab Results  Component Value Date   HGBA1C 6.2* 07/27/2014    MPG 131 07/27/2014    Cardiac Panel (last 3 results)  Recent Labs  07/27/14 0515 08/22/14 0830 08/23/14 0822  TROPONINI 0.20* 0.34* 0.29*   BNP (last 3 results)  Recent Labs  07/26/14 1010  BNP 1007.4*    TSH  Recent Labs  07/27/14 0515 08/23/14 0822  TSH 1.036 1.115     Hepatic Function Panel  Recent Labs  07/11/14 1102 07/26/14 1825 08/04/14 1344  PROT 6.9 7.1 7.6  ALBUMIN 3.4* 3.5 3.6  AST 30 38 33  ALT 31 33 29  ALKPHOS 82 85 125  BILITOT 0.78 0.9 0.65  BILIDIR  --  0.2  --   IBILI  --  0.7  --     EKG 08/23/2014: Sinus rhythm at a rate of 83 bpm, left axis deviation, left anterior fascicular block, poor R-wave progression, T-wave flattening in inferior leads and T-wave inversion in lateral leads, cannot exclude inferior and anterolateral infarct, old vs LVH with repol abnormality. Pulmonary disease pattern. No significant change from prior EKG.  Echo 07/27/2014:  Left ventricle: The cavity size was normal. There was moderate concentric hypertrophy. Speckled pattern and may suggest infiltrative cardiomyopathy or long standing hypertension. Systolic function was normal. The estimated ejection fraction was in the range of 60% to 65%. Features are consistent with a pseudonormal left ventricular filling pattern,  with concomitant abnormal relaxation and increased filling pressure (grade 2 diastolic dysfunction). Doppler parameters are consistent with both elevated ventricular end-diastolic filling pressure andelevated left atrial filling pressure. Aortic valve: Quadricuspid. There was no stenosis. Mild elevation in velocity probably due to quadracuspid AV with mild AI. There was trivial regurgitation. Valve area (VTI): 1.57 cm^2. Valve area (Vmax): 1.61 cm^2. Valve area (Vmean): 1.59 cm^2.  Mitral valve: There was mild regurgitation. Left atrium: The atrium was mildly dilated. Atrial septum: No defect or patent foramen ovale was identified. Pericardium,  extracardiac: A small, free-flowing pericardial effusion was identified. The fluid had no internal echoes.There was no evidence of hemodynamic compromise.  Outpatient Exercise Myoview stress test 10/21/2013: 1. Resting EKG showed normal sinus rhythm, poor R wave progression, non-specific ST-depression and T flattening in inferior and lateral leads, cannot R/O ischemia. Stress EKG was equivocal for non diagnostic for ischemia. There was 0.5 mm additional ST depression noted at peak exercise, which resolved at < 2 minutes into recovery. Patient exercised on BRUCE PROTOCOL for 85mnutes 00 seconds. The maximum work level achieved was 5.8 1MET's. The test was terminated due to achievement of the target heart rate.  2. Perfusion imaging study demonstrates normal perfusion without ischemia or scar. Normal left ventricular ejection fraction. Based on the stress results, continued primary prevention is recommended.  Assessment/Plan:  1. Recurrent pleural effusion, Suspect acute on chronic diastolic heart failure, ? Low grade malignancy. H/O [place] ca. 2. Elevated troponin, Nonspecific and due to probable acute on chronic diastolic heart failure 3. Acute bronchitis with productive sputum ? Pneumonia and presently on antibiotics. 4. CKD Stage III to IV with diuresis. 5. HTN With hypertensive heart disease, low BP since admission, probably due to low grade sepsis and diuresis. 6. DM Type II controlled 7. PAF: CHA2DS2-VASc Score is 5 with yearly risk of stroke of 6.7% per year. HAS-BLED score is 1 with bleeding risk of 1.02-1.5%  8. Severe anemia, Reduced serum iron levels, suspect iron deficiency anemia. 9. Massive thyromegaly by CT last year. TSH normal.  Rec: Discontinue Lasix. Decrease dose of Atorva with poor intake and deconditioning. Patient is 89years, and outside of guidelines for now at least during acute illness. Consider repeat CT chest, non contrast after [place] pleurocent[place] to evaluate  any mass or malignancy.  GAdrian Prows M.D. 08/24/2014, 6:55 AM [facility] Pager: [phone] Office: [phone] no answer: [phone]

## 2014-08-25 LAB — BASIC METABOLIC PANEL
Anion gap: 10 (ref 5–15)
BUN: 42 mg/dL — ABNORMAL HIGH (ref 6–20)
CALCIUM: 10.2 mg/dL (ref 8.9–10.3)
CHLORIDE: 105 mmol/L (ref 101–111)
CO2: 24 mmol/L (ref 22–32)
CREATININE: 1.81 mg/dL — AB (ref 0.44–1.00)
GFR, EST AFRICAN AMERICAN: 30 mL/min — AB (ref >60)
GFR, EST NON AFRICAN AMERICAN: 25 mL/min — AB (ref >60)
GLUCOSE: 87 mg/dL (ref 65–99)
Potassium: 3.6 mmol/L (ref 3.5–5.1)
Sodium: 139 mmol/L (ref 135–145)

## 2014-08-25 LAB — PROCALCITONIN: Procalcitonin: <0.1 ng/mL

## 2014-08-25 LAB — CBC
HEMATOCRIT: 27.2 % — AB (ref 36.0–46.0)
Hemoglobin: 8.7 g/dL — ABNORMAL LOW (ref 12.0–15.0)
MCH: 28 pg (ref 26.0–34.0)
MCHC: 32 g/dL (ref 30.0–36.0)
MCV: 87.5 fL (ref 78.0–100.0)
Platelets: 260 10*3/uL (ref 150–400)
RBC: 3.11 MIL/uL — ABNORMAL LOW (ref 3.87–5.11)
RDW: 15.6 % — ABNORMAL HIGH (ref 11.5–15.5)
WBC: 5.1 10*3/uL (ref 4.0–10.5)

## 2014-08-25 LAB — GLUCOSE, CAPILLARY: Glucose-Capillary: 91 mg/dL (ref 65–99)

## 2014-08-25 MED ORDER — FERROUS SULFATE 325 (65 FE) MG PO TABS: 325.0000 mg | ORAL_TABLET | Freq: Three times a day (TID) | ORAL | Status: DC

## 2014-08-25 MED ORDER — RIVAROXABAN 15 MG PO TABS: 15.0000 mg | ORAL_TABLET | Freq: Every day | ORAL | Status: DC

## 2014-08-25 MED ORDER — APIXABAN 2.5 MG PO TABS: 2.5000 mg | ORAL_TABLET | Freq: Two times a day (BID) | ORAL | Status: DC

## 2014-08-25 MED ORDER — LEVOFLOXACIN 750 MG PO TABS: 750.0000 mg | ORAL_TABLET | ORAL | Status: DC

## 2014-08-25 MED ORDER — ATORVASTATIN CALCIUM 10 MG PO TABS: 10.0000 mg | ORAL_TABLET | Freq: Every day | ORAL | Status: DC

## 2014-08-25 NOTE — Discharge Summary (Addendum)
Physician Discharge Summary  Alison Gross XVQ:[id number] DOB: Jun 24, 1932 DOA: 08/22/2014  PCP: Maximino Greenland, MD  Admit date: 08/22/2014 Discharge date: 08/25/2014  Recommendations for Outpatient Follow-up:  1. Recommend outpatient CT scan to evaluation for underlying malignancy per Dr. Jana Hakim, PCP, or Dr. Einar Gip pending review of kidney function in 1-2 weeks. 2. Ongoing management of anemia from MGUS.  Appears to have some iron deficiency as well.  Occult stool not obtained during hospitalization.  Defer screening for colon cancer to PCP. 3. PCP to please follow up cytology and body fluid culture and PTHrp 4. BP medications have been stopped and cholesterol medication and xarelto doses reduced' 5. BMP and CBC in 1 week post discharge to follow up creatinine and anemia  Discharge Diagnoses:  Principal Problem:   HCAP (healthcare-associated pneumonia) Active Problems:   MGUS (monoclonal gammopathy of unknown significance)   Ductal carcinoma in situ (DCIS) of left breast   Elevated troponin   Stage III chronic kidney disease   Paroxysmal atrial fibrillation   Type 2 diabetes mellitus with renal manifestations   Pleural effusion   Discharge Condition: stable, improved  Diet recommendation: regular with supplements  Wt Readings from Last 3 Encounters:  08/25/14 47.492 kg (104 lb 11.2 oz)  08/04/14 53.162 kg (117 lb 3.2 oz)  07/28/14 51.665 kg (113 lb 14.4 oz)    History of present illness:   Alison Gross is a 81 y.o. female with history of MGUS, DCIS of left breast, stage III CKD, paroxysmal atrial fibrillation on xarelto who presented with 4 days of shortness of breath and cough. Patient tried some Lasix but did not find any improvement with the Lasix. She had been coughing up yellow phlegm which got progressively worse.  She may have been around some sick people at church.  Her CXR demonstrated chronic moderate right pleural effusion and she had a troponin of 0.34.    Hospital Course:   Possible HCAP - Procalcitonin: 0.14 - Change to empiric HCAP coverage secondary to recent hospital admission - HIV, legionella and s. pneumo negative - Blood cultures NGTD -  She was initially started on vancomycin and zosyn but will be narrowed to levofloxacin at the time of discharge to complete a 7 day course of antibiotics.  She was also given a prescription for florastor to continue for three months to reduce the risk of C. Diff diarrhea.    Chronic right pleural effusion, present since at least 2014.  This may be due to underlying malignancy or from lymphatic changes s/p mastectomy.   - US guided thoracentesis performed on 6/16 with removal of 820m of serous fluid - CT chest as outpatient.  If her creatinine recovers to GFR > 40% would recommend doing scan with IV contrast.  Otherwise, would order without.   - positive exudative by protein ratio and mildly elevated LDH, but LDH ratio below threshold, few WBC, normal glucose and protein.  Despite being borderline positive for exudative effusion by Light's criteria, I suspect this is transudative and possibly malignant given the atypical cells seen on gram stain. - F/u cx - F/u cytology  Chronic diastolic heart failure.  She was initially diuresed some but she quickly developed hypotension and AKI.  Her diuretics were held and her creatinine and blood pressure improved.   - Grade 2 diastolic dysfunction, concentric hypertrophy and pulmonary hypertension.  Iron deficiency anemia superimposed on MGUS. - Started iron supplementation - B12, folate, and TSH were wnl - occult stool was unable to  be obtained.  Defer work up for colon cancer to PCP.    Hypercalcemia, resolved - Noted dating back to 01/18/14. Bone scan negative for metastatic disease. - PTH low at 10 - vitamin D:  54  - F/u PTHrp  Hypertension with hypotenstion - Held HCTZ/ARB - Held metoprolol -  Dr. Einar Gip to please readdress as  outpatient once she has more fully recovered from this illness.  Dyslipidemia - Lipitor decreased by cardiology due to age and frailty. Consider stopping altogether.  Defer to cardiologist  Type II Diabetes with renal manifestations - Controlled on Tradjenta. - Hemoglobin A1c 6.2%.  Paroxysmal atrial fibrillation - Currently in normal sinus rhythm. - agree that with CrCl < 30, she should be transitioned to apixaban instead.  Rx provided and case management consulted to ensure she is able to receive medication at discharge. - BB stopped due to hypotension but should be resumed quickly as outpatient once blood pressure tolerates  Primary cancer of lower-inner quadrant of right female breast - Continue Arimidex.  MGUS (monoclonal gammopathy of unknown significance) - Monitor renal function.  Weight loss, unintentional / severe protein calorie malnutrition - Nutrition consultation - TSH is 1.036, WNL  Elevated troponin - chronically elevated due to heart failure and CKD  Stage IV chronic kidney disease, creatinine rose slightly with diuresis to 1.8  Goiter, stable on recent US - TSH WNL. Has a history of goiter in the past treated with surgical removal and RAI. Prior pathology showed thyroiditis.  Constipation, resolved with miralax and prune juice  Consultants:  Cardiology  Procedures:  CXR  US guided paracentesis on 6/16  Antibiotics:  Ceftriaxone 6/14 >> 6/15  Azithromycin 6/14 >> 6/15   Vancomycin 6/15 >>   Zosyn 6/15 >>  Discharge Exam: Filed Vitals:   08/25/14 0613  BP: 99/49  Pulse: 82  Temp: 98.3 F (36.8 C)  Resp: 20   Filed Vitals:   08/24/14 1330 08/24/14 1700 08/24/14 2154 08/25/14 0613  BP: 85/46  80/41 99/49  Pulse: 87  88 82  Temp: 97.8 F (36.6 C)  98.3 F (36.8 C) 98.3 F (36.8 C)  TempSrc: Oral  Oral Oral  Resp: '20  20 20  '$ Height:      Weight:  49.1 kg (108 lb 3.9 oz)  47.492 kg (104 lb 11.2 oz)  SpO2: 95%  97% 99%      General: Cachectic appearing F, No acute distress, sitting up smiling and recently ambulating halls  HEENT: NCAT, MMM  Cardiovascular: RRR, 4/6 systolic murmur heard at LSB, 2+ pulses, warm extremities  Respiratory: Rales at right base with markedly improved aeration, no increased WOB  Abdomen: NABS, soft, moderately distended, nontender  MSK: Normal tone and bulk, no LEE  Neuro: Grossly intact  Discharge Instructions      Discharge Instructions    (HEART FAILURE PATIENTS) Call MD:  Anytime you have any of the following symptoms: 1) 3 pound weight gain in 24 hours or 5 pounds in 1 week 2) shortness of breath, with or without a dry hacking cough 3) swelling in the hands, feet or stomach 4) if you have to sleep on extra pillows at night in order to breathe.    Complete by:  As directed      Call MD for:  difficulty breathing, headache or visual disturbances    Complete by:  As directed      Call MD for:  extreme fatigue    Complete by:  As directed  Call MD for:  hives    Complete by:  As directed      Call MD for:  persistant dizziness or light-headedness    Complete by:  As directed      Call MD for:  persistant nausea and vomiting    Complete by:  As directed      Call MD for:  severe uncontrolled pain    Complete by:  As directed      Call MD for:  temperature >100.4    Complete by:  As directed      Diet - low sodium heart healthy    Complete by:  As directed      Discharge instructions    Complete by:  As directed   You were hospitalized with cough and shortness of breath due to bronchitis and possible pneumonia.  Please take levofloxacin every other day, your next dose is due tomorrow morning and last dose is due on Monday.  You had fluid removed from around your right lung and there are several tests which are pending at the time of discharge related to this fluid.  Please follow up with your primary care doctor in 1 week to have them review the test  results.  You may need a CT scan of your chest to see why this fluid has been building up, but it is best if this test is done with IV contrast if possible.  Your primary care doctor or Dr. Jana Hakim can check your kidney function in a week to determine if it is safe for you to have IV contrast and order the test at that time.  Also, because your blood pressure was low, I have stopped your blood pressure medications for now and they may need to restart some of these medications in a week depending on your blood pressure at that time.  Your cholesterol medication has been reduced to '10mg'$  once daily and I have given you a new prescription for this.  I have also reduced the dose of your xarelto because of your kidney function to '15mg'$  once daily.  Do NOT take your aspirin.  You have some mild iron deficiency.  Please talk to your doctor about this.  I have started you on some iron supplementation in the mean time.  If you have increasing shortness of breath, lightheadedness, or fainting spells, chest pains, or any other concerning symptoms, please return to the hospital or call 911 right away.     Increase activity slowly    Complete by:  As directed             Medication List    STOP taking these medications        aspirin 81 MG EC tablet     feeding supplement (PRO-STAT SUGAR FREE 64) Liqd     furosemide 40 MG tablet  Commonly known as:  LASIX     losartan-hydrochlorothiazide 100-12.5 MG per tablet  Commonly known as:  HYZAAR     metoprolol 50 MG tablet  Commonly known as:  LOPRESSOR     Vitamin D 2000 UNITS Caps      TAKE these medications        acetaminophen 500 MG tablet  Commonly known as:  TYLENOL  Take 500 mg by mouth every 6 (six) hours as needed for moderate pain or headache.     anastrozole 1 MG tablet  Commonly known as:  ARIMIDEX  TAKE 1 TABLET (1 MG TOTAL) BY MOUTH DAILY.  atorvastatin 10 MG tablet  Commonly known as:  LIPITOR  Take 1 tablet (10 mg total) by  mouth daily at 6 PM.     cycloSPORINE 0.05 % ophthalmic emulsion  Commonly known as:  RESTASIS  Place 1 drop into both eyes 2 (two) times daily as needed (dry eyes).     feeding supplement (ENSURE ENLIVE) Liqd  Take 237 mLs by mouth 2 (two) times daily between meals.     ferrous sulfate 325 (65 FE) MG tablet  Take 1 tablet (325 mg total) by mouth 3 (three) times daily with meals.     levofloxacin 750 MG tablet  Commonly known as:  LEVAQUIN  Take 1 tablet (750 mg total) by mouth every other day.     multivitamin with minerals Tabs tablet  Take 1 tablet by mouth every other day.     Rivaroxaban 15 MG Tabs tablet  Commonly known as:  XARELTO  Take 1 tablet (15 mg total) by mouth daily.     TRADJENTA 5 MG Tabs tablet  Generic drug:  linagliptin  Take 5 mg by mouth daily.       Follow-up Information    Follow up with Maximino Greenland, MD. Schedule an appointment as soon as possible for a visit in 1 week.   Specialty:  Internal Medicine   Contact information:   [address] [place] [address] [phone]       Follow up with Adrian Prows, MD. Schedule an appointment as soon as possible for a visit in 2 weeks.   Specialty:  Cardiology   Contact information:   [address] [place] [address] [phone]       Follow up with Chauncey Cruel, MD. Schedule an appointment as soon as possible for a visit in 3 weeks.   Specialty:  Oncology   Contact information:   [place] [address] [phone]        The results of significant diagnostics from this hospitalization (including imaging, microbiology, ancillary and laboratory) are listed below for reference.    Significant Diagnostic Studies: Dg Chest 1 View  08/24/2014   CLINICAL DATA:  Status post thoracentesis.  EXAM: CHEST  1 VIEW  COMPARISON:  08/22/2014  FINDINGS: Surgical clips about the chest bilaterally. right-sided thyroid enlargement with mild tracheal  deviation left. Decrease in small right pleural effusion, without pneumothorax. A small left pleural effusion is similar. Improved patchy right lower lobe predominant atelectasis.  IMPRESSION: No pneumothorax.  Decreased right pleural effusion with persistent left tiny pleural effusion. Improved right-sided aeration.  Massive right-sided thyroid enlargement, as before.   Electronically Signed   By: Abigail Miyamoto M.D.   On: 08/24/2014 11:18   Dg Chest 2 View  08/22/2014   CLINICAL DATA:  81 year old female with current history of large substernal goiter, pleural effusion. Productive cough shortness of breath and chills for 3 days. Initial encounter.  EXAM: CHEST  2 VIEW  COMPARISON:  08/04/2014 and earlier.  FINDINGS: Abnormal thoracic inlet and right mediastinal contours seen related to massive thyromegaly on prior chest CT and PET-CT. Stable mediastinal contours. Stable mass effect on the subglottic trachea.  Superimposed moderate right pleural effusion persists with associated patchy adjacent lung opacity. No superimposed pneumothorax or pulmonary edema. No definite left pleural effusion. Postoperative changes to the axilla and chest wall. Stable visualized osseous structures.  IMPRESSION: Stable, with moderate right pleural effusion, and massive thyromegaly with mediastinal extension.  No new cardiopulmonary  abnormality identified.   Electronically Signed   By: Genevie Ann M.D.   On: 08/22/2014 09:03   Dg Chest 2 View  08/04/2014   CLINICAL DATA:  Followup pleural effusion.  History of lung cancer.  EXAM: CHEST  2 VIEW  COMPARISON:  07/26/2014 and chest CT 10/28/2013 as well as chest x-ray 10/18/2013  FINDINGS: Exam demonstrates no significant change in a moderate size right pleural fluid collection likely with associated atelectasis. Surgical clips over the thorax bilaterally. Left lung is otherwise clear. Mild stable cardiomegaly. Stable known severe substernal goiter with tracheal deviation to the left.  Remainder of the exam is unchanged.  IMPRESSION: Stable moderate size right pleural effusion likely with associated atelectasis.  Stable extensive right-sided substernal goiter with mild tracheal deviation to the left.  Mild stable cardiomegaly.   Electronically Signed   By: Marin Olp M.D.   On: 08/04/2014 14:18   Nm Bone Scan Whole Body  07/27/2014   CLINICAL DATA:  Right breast cancer.  Kidney disease.  EXAM: NUCLEAR MEDICINE WHOLE BODY BONE SCAN  TECHNIQUE: Whole body anterior and posterior images were obtained approximately 3 hours after intravenous injection of radiopharmaceutical.  RADIOPHARMACEUTICALS:  26.3 mCi Technetium-24mMDP IV  COMPARISON:  PET-CT 10/28/2013  FINDINGS: There is increased radiotracer uptake within the lower lumbar spine. This is likely related to degenerative disc disease. No abnormal focus of increased uptake identified to suggest bone metastasis. Normal physiologic tracer activity is seen within the kidneys an urinary bladder.  IMPRESSION: 1. No evidence for metastatic disease. 2. Lumbar spondylosis noted.   Electronically Signed   By: TKerby MoorsM.D.   On: 07/27/2014 14:18   [place]Soft Tissue Head/neck  07/28/2014   CLINICAL DATA:  Evaluate thyroid goiter. History of radioactive iodine treatment. Prior thyroid biopsies in 2015.  EXAM: THYROID ULTRASOUND  TECHNIQUE: Ultrasound examination of the thyroid gland and adjacent soft tissues was performed.  COMPARISON:  11/16/2013  FINDINGS: Right thyroid lobe  Measurements: 7.6 x 4.7 x 3.1 cm (previously 7.6 x 4.3 x 2.8 cm). There is a dominant hypoechoic nodule in the superior right thyroid lobe that measures 2.8 x 1.7 x 2.5 cm and previously measured 2.7 x 1.6 x 2.6 cm. There is a dominant hyperechoic nodule in the inferior right thyroid lobe that measures 4.1 x 2.2 x 2.5 cm and previously measured 4.0 x 2.3 x 2.5 cm. There is a complex inferior right thyroid nodule that measures 2.2 x 1.5 x 1.6 cm and previously measured 2.2 x  1.3 x 1.6 cm. The right thyroid lobe is diffusely heterogeneous and hypervascular.  Left thyroid lobe  Measurements: 6.3 x 3.6 x 3.6 cm (previously 6.2 x 3.6 x 3.1 cm). There is mildly complex nodule along the superior left thyroid lobe that measures 2.7 x 1.8 x 1.9 cm and previously measured 2.6 x 1.8 x 1.6 cm. There is a large heterogeneous nodule in the inferior left thyroid lobe that measures 3.5 x 2.8 x 3.0 cm and previously measured 3.5 x 2.9 x 2.6 cm.  Isthmus  Thickness: 0.7 cm (previously 0.6 cm). There is a heterogeneous nodule along the left side of the isthmus that measures 1.6 x 1.6 x 1.0 cm and previously measured 1.6 x 1.2 x 0.7 cm.  Lymphadenopathy  None visualized.  IMPRESSION: Multinodular goiter. The dominant nodules have minimally changed in size or morphology since 2015.   Electronically Signed   By: AMarkus DaftM.D.   On: 07/28/2014 12:12   [place]  Thoracentesis Asp Pleural Space W/img Guide  08/24/2014   INDICATION: Symptomatic right sided pleural effusion.  EXAM: US THORACENTESIS ASP PLEURAL SPACE W/IMG GUIDE  COMPARISON:  CXR 08/22/14.  MEDICATIONS: None  COMPLICATIONS: None immediate  TECHNIQUE: Informed written consent was obtained from the patient after a discussion of the risks, benefits and alternatives to treatment. A timeout was performed prior to the initiation of the procedure.  Initial ultrasound scanning demonstrates a right pleural effusion. The lower chest was prepped and draped in the usual sterile fashion. 1% lidocaine was used for local anesthesia.  Under direct ultrasound guidance, a 19 gauge, 7-cm, Yueh catheter was introduced. An ultrasound image was saved for documentation purposes. The thoracentesis was performed. The catheter was removed and a dressing was applied. The patient tolerated the procedure well without immediate post procedural complication. The patient was escorted to have an upright chest radiograph.  FINDINGS: A total of approximately 800 ml of serous fluid  was removed. Requested samples were sent to the laboratory.  IMPRESSION: Successful ultrasound-guided right sided thoracentesis yielding 800 ml of pleural fluid.  Read By:  Tsosie Billing PA-C   Electronically Signed   By: Markus Daft M.D.   On: 08/24/2014 11:54    Microbiology: Recent Results (from the past 240 hour(s))  Culture, blood (routine x 2) Call MD if unable to obtain prior to antibiotics being given     Status: None (Preliminary result)   Collection Time: 08/22/14  3:00 PM  Result Value Ref Range Status   Specimen Description BLOOD LEFT HAND  Final   Special Requests BOTTLES DRAWN AEROBIC ONLY 10CC  Final   Culture   Final           BLOOD CULTURE RECEIVED NO GROWTH TO DATE CULTURE WILL BE HELD FOR 5 DAYS BEFORE ISSUING A FINAL NEGATIVE REPORT Performed at [facility]    Report Status PENDING  Incomplete  Culture, blood (routine x 2) Call MD if unable to obtain prior to antibiotics being given     Status: None (Preliminary result)   Collection Time: 08/22/14  3:20 PM  Result Value Ref Range Status   Specimen Description BLOOD LEFT ARM  Final   Special Requests BOTTLES DRAWN AEROBIC AND ANAEROBIC 10CC  Final   Culture   Final           BLOOD CULTURE RECEIVED NO GROWTH TO DATE CULTURE WILL BE HELD FOR 5 DAYS BEFORE ISSUING A FINAL NEGATIVE REPORT Performed at [facility]    Report Status PENDING  Incomplete  Gram stain     Status: None   Collection Time: 08/24/14 10:46 AM  Result Value Ref Range Status   Specimen Description PLEURAL  Final   Special Requests NONE  Final   Gram Stain   Final    CYTOSPIN SMEAR WBC PRESENT, PREDOMINANTLY MONONUCLEAR NO ORGANISMS SEEN Performed at [facility]    Report Status 08/24/2014 FINAL  Final     Labs: Basic Metabolic Panel:  Recent Labs Lab 08/22/14 0830 08/23/14 0426 08/24/14 0508 08/25/14 0524  NA 138 137 138 139  K 3.8 3.8 3.6 3.6  CL 103 102 102 105  CO2 '28 27 27 24  '$ GLUCOSE 99 94 83 87  BUN  40* 42* 45* 42*  CREATININE 1.62* 1.61* 1.89* 1.81*  CALCIUM 10.8* 10.5* 10.5* 10.2   Liver Function Tests:  Recent Labs Lab 08/24/14 1420  AST 24  ALT 15  ALKPHOS 65  BILITOT 0.6  PROT  6.1*  ALBUMIN 3.0*   No results for input(s): LIPASE, AMYLASE in the last 168 hours. No results for input(s): AMMONIA in the last 168 hours. CBC:  Recent Labs Lab 08/22/14 0830 08/23/14 0426 08/25/14 0524  WBC 5.7 4.7 5.1  NEUTROABS 3.7  --   --   HGB 10.1* 8.5* 8.7*  HCT 31.4* 27.0* 27.2*  MCV 88.0 86.0 87.5  PLT 295 252 260   Cardiac Enzymes:  Recent Labs Lab 08/22/14 0830 08/23/14 0822  TROPONINI 0.34* 0.29*   BNP: BNP (last 3 results)  Recent Labs  07/26/14 1010  BNP 1007.4*    ProBNP (last 3 results) No results for input(s): PROBNP in the last 8760 hours.  CBG:  Recent Labs Lab 08/23/14 2141 08/24/14 0737 08/24/14 1212 08/24/14 1646 08/24/14 2158  GLUCAP 84 81 96 103* 126*    Time coordinating discharge: 35 minutes  Signed:  Asako Saliba  [facility] 08/25/2014, 10:32 AM

## 2014-08-25 NOTE — Progress Notes (Signed)
Suspect the presentation more consistent with infectious etiology with acute decompensated diastolic CHF. Anemia needs correction. Will ask pharmacy to see if Eliquis would be better with renal insufficiency. I will signoff for now and will see her in the office in 2-3 weeks. Please call if questions as you have my cell. Anemia needs f/u and suspect also chronic disease state contributing. Plain CT chest without contrast could be considered as pleural fluid has been drained. Thanks Noah Delaine.

## 2014-08-25 NOTE — Progress Notes (Signed)
ANTIBIOTIC CONSULT NOTE - INITIAL  Pharmacy Consult for Vancomycin, Zosyn Indication: HCAP  No Known Allergies  Patient Measurements: Height: '5\' 5"'$  (165.1 cm) Weight: 104 lb 11.2 oz (47.492 kg) IBW/kg (Calculated) : 57  Vital Signs: Temp: 98.3 F (36.8 C) (06/17 0613) Temp Source: Oral (06/17 4098) BP: 99/49 mmHg (06/17 1191) Pulse Rate: 82 (06/17 0613) Intake/Output from previous day: 06/16 0701 - 06/17 0700 In: -  Out: 150 [Urine:150] Intake/Output from this shift:    Labs:  Recent Labs  08/22/14 0830 08/23/14 0426 08/24/14 0508 08/25/14 0524  WBC 5.7 4.7  --  5.1  HGB 10.1* 8.5*  --  8.7*  PLT 295 252  --  260  CREATININE 1.62* 1.61* 1.89* 1.81*   Estimated Creatinine Clearance: 18.9 mL/min (by C-G formula based on Cr of 1.81). No results for input(s): VANCOTROUGH, VANCOPEAK, VANCORANDOM, GENTTROUGH, GENTPEAK, GENTRANDOM, TOBRATROUGH, TOBRAPEAK, TOBRARND, AMIKACINPEAK, AMIKACINTROU, AMIKACIN in the last 72 hours.   Microbiology: Recent Results (from the past 720 hour(s))  MRSA PCR Screening     Status: None   Collection Time: 07/26/14  1:47 PM  Result Value Ref Range Status   MRSA by PCR NEGATIVE NEGATIVE Final    Comment:        The GeneXpert MRSA Assay (FDA approved for NASAL specimens only), is one component of a comprehensive MRSA colonization surveillance program. It is not intended to diagnose MRSA infection nor to guide or monitor treatment for MRSA infections.   Culture, blood (routine x 2) Call MD if unable to obtain prior to antibiotics being given     Status: None (Preliminary result)   Collection Time: 08/22/14  3:00 PM  Result Value Ref Range Status   Specimen Description BLOOD LEFT HAND  Final   Special Requests BOTTLES DRAWN AEROBIC ONLY 10CC  Final   Culture   Final           BLOOD CULTURE RECEIVED NO GROWTH TO DATE CULTURE WILL BE HELD FOR 5 DAYS BEFORE ISSUING A FINAL NEGATIVE REPORT Performed at [facility]    Report  Status PENDING  Incomplete  Culture, blood (routine x 2) Call MD if unable to obtain prior to antibiotics being given     Status: None (Preliminary result)   Collection Time: 08/22/14  3:20 PM  Result Value Ref Range Status   Specimen Description BLOOD LEFT ARM  Final   Special Requests BOTTLES DRAWN AEROBIC AND ANAEROBIC 10CC  Final   Culture   Final           BLOOD CULTURE RECEIVED NO GROWTH TO DATE CULTURE WILL BE HELD FOR 5 DAYS BEFORE ISSUING A FINAL NEGATIVE REPORT Performed at [facility]    Report Status PENDING  Incomplete  Gram stain     Status: None   Collection Time: 08/24/14 10:46 AM  Result Value Ref Range Status   Specimen Description PLEURAL  Final   Special Requests NONE  Final   Gram Stain   Final    CYTOSPIN SMEAR WBC PRESENT, PREDOMINANTLY MONONUCLEAR NO ORGANISMS SEEN Performed at [facility]    Report Status 08/24/2014 FINAL  Final    Medical History: Past Medical History  Diagnosis Date  . Hypercholesterolemia     takes Crestor daily  . Thyroid disease     had iodine radiation  . Hypertension     takes Hyzaar daily  . Dysrhythmia     takes Carvedilol daily  . Pneumonia     history  of;last time about 4-72yr ago  . GERD (gastroesophageal reflux disease)     takes Protonix daily  . Gastric ulcer   . History of blood transfusion   . History of colon polyps   . Anemia     takes iron pill daily  . Diabetes mellitus without complication     takes Tradjenta daily  . History of radiation therapy 07/12/12-08/26/12    right breast/  . Paroxysmal atrial fibrillation     PCP EKG 09/27/2013: A. Fibrillation.. EKG 09/29/2013: S. Tach.  . Breast cancer 04/12/12    right-pos lymph node/left-DCIS  . Shortness of breath on exertion   . Bruit of left carotid artery   . Acute upper GI bleeding 01/24/2012  . MGUS (monoclonal gammopathy of unknown significance) 04/21/2013  . Osteoporosis 11/29/2013  . Recurrent right pleural effusion 07/26/2014  .  Stage III chronic kidney disease 07/26/2014  . Goiter 07/27/2014   Assessment: 81y.o F with PMH of MGUS, breast cancer, CKD stage III, PAF [facility] and [facility]. Patient initially placed on Rocephin and Zithromax, now transitioned to Vancomycin and Zosyn for HCAP coverage given recent hospitalization.  6/14 >> CTX >> 6/15 6/14 >> Zithromax >> 6/15 6/15 >> Vancomycin >>  6/15 >> Zosyn >>    Temp: afebrile WBC: wnl Renal: SCr elevated but down slightly to 1.81 ; CrCl now 19 CG   6/14 blood x 2: NGTD 6/14 Legionella urinary antigen: negative 6/14 Strep pneumo urinary antigen: negative   Goal of Therapy:  Vancomycin trough level 15-20 mcg/ml  Appropriate antibiotic dosing for renal function and indication Eradication of infection  Plan:   D/t renal insufficiency, will d/c standing vancomycin and check vancomycin trough prior to tonight's dose to r/o accumulation.  Will re-dose if level is <20. Continue Zosyn 3.375g IV q8h (infuse over 4 hours) Monitor renal function, cultures, clinical course.   Discussed with Dr. GEinar Gipregarding NOAC-- since patients with crcl<25 were excluded in the Eliquis Afib study, will continue with Xarelto for now.  Coumadin is still AC of choice for patients with severe renal insufficiency.  Dr. GEinar Gipmay consider switching patient to coumadin if her renal function continues to worsen.  ADia Sitter PharmD, BCPS 08/25/2014 8:13 AM

## 2014-08-28 ENCOUNTER — Telehealth: Payer: Self-pay | Admitting: Oncology

## 2014-08-28 ENCOUNTER — Other Ambulatory Visit: Payer: Self-pay | Admitting: Oncology

## 2014-08-28 DIAGNOSIS — C50919 Malignant neoplasm of unspecified site of unspecified female breast: Secondary | ICD-10-CM

## 2014-08-28 LAB — PTH-RELATED PEPTIDE: PTH-related peptide: 2.1 pmol/L

## 2014-08-28 LAB — CULTURE, BLOOD (ROUTINE X 2)
CULTURE: NO GROWTH
Culture: NO GROWTH

## 2014-08-28 NOTE — Progress Notes (Unsigned)
Cytology from right thoracentesis on Alison Gross obtained 08/24/2014 was benign ([id number] [id number]). I'm going to obtain a PET scan before her return visit with me next month.

## 2014-08-28 NOTE — Telephone Encounter (Signed)
Spoke with patient and she is aware of her July appointments and i have mailed her a new schedule/calendar as well  anne

## 2014-08-29 LAB — CULTURE, BODY FLUID-BOTTLE: Culture: NO GROWTH

## 2014-08-29 LAB — CULTURE, BODY FLUID W GRAM STAIN -BOTTLE

## 2014-09-01 ENCOUNTER — Other Ambulatory Visit: Payer: Self-pay | Admitting: Oncology

## 2014-09-15 ENCOUNTER — Other Ambulatory Visit: Payer: Self-pay | Admitting: *Deleted

## 2014-09-15 DIAGNOSIS — C50311 Malignant neoplasm of lower-inner quadrant of right female breast: Secondary | ICD-10-CM

## 2014-09-18 ENCOUNTER — Ambulatory Visit (HOSPITAL_COMMUNITY)
Admission: RE | Admit: 2014-09-18 | Discharge: 2014-09-18 | Disposition: A | Discharge status: Home / Self Care | Payer: Commercial Managed Care - HMO | Source: Ambulatory Visit | Attending: Oncology | Admitting: Oncology

## 2014-09-18 ENCOUNTER — Other Ambulatory Visit (HOSPITAL_BASED_OUTPATIENT_CLINIC_OR_DEPARTMENT_OTHER): Payer: Commercial Managed Care - HMO

## 2014-09-18 DIAGNOSIS — E860 Dehydration: Secondary | ICD-10-CM | POA: Diagnosis present

## 2014-09-18 DIAGNOSIS — Z923 Personal history of irradiation: Secondary | ICD-10-CM

## 2014-09-18 DIAGNOSIS — I480 Paroxysmal atrial fibrillation: Secondary | ICD-10-CM | POA: Diagnosis present

## 2014-09-18 DIAGNOSIS — D472 Monoclonal gammopathy: Secondary | ICD-10-CM | POA: Diagnosis present

## 2014-09-18 DIAGNOSIS — T82868A Thrombosis of vascular prosthetic devices, implants and grafts, initial encounter: Secondary | ICD-10-CM | POA: Diagnosis not present

## 2014-09-18 DIAGNOSIS — E049 Nontoxic goiter, unspecified: Secondary | ICD-10-CM | POA: Insufficient documentation

## 2014-09-18 DIAGNOSIS — N184 Chronic kidney disease, stage 4 (severe): Secondary | ICD-10-CM | POA: Diagnosis present

## 2014-09-18 DIAGNOSIS — I129 Hypertensive chronic kidney disease with stage 1 through stage 4 chronic kidney disease, or unspecified chronic kidney disease: Secondary | ICD-10-CM | POA: Diagnosis present

## 2014-09-18 DIAGNOSIS — J90 Pleural effusion, not elsewhere classified: Secondary | ICD-10-CM | POA: Insufficient documentation

## 2014-09-18 DIAGNOSIS — E2740 Unspecified adrenocortical insufficiency: Secondary | ICD-10-CM | POA: Diagnosis present

## 2014-09-18 DIAGNOSIS — I5033 Acute on chronic diastolic (congestive) heart failure: Secondary | ICD-10-CM | POA: Diagnosis not present

## 2014-09-18 DIAGNOSIS — E43 Unspecified severe protein-calorie malnutrition: Secondary | ICD-10-CM | POA: Diagnosis present

## 2014-09-18 DIAGNOSIS — R490 Dysphonia: Secondary | ICD-10-CM | POA: Diagnosis present

## 2014-09-18 DIAGNOSIS — I959 Hypotension, unspecified: Secondary | ICD-10-CM | POA: Diagnosis not present

## 2014-09-18 DIAGNOSIS — Z833 Family history of diabetes mellitus: Secondary | ICD-10-CM

## 2014-09-18 DIAGNOSIS — D509 Iron deficiency anemia, unspecified: Secondary | ICD-10-CM | POA: Diagnosis present

## 2014-09-18 DIAGNOSIS — I313 Pericardial effusion (noninflammatory): Secondary | ICD-10-CM | POA: Diagnosis not present

## 2014-09-18 DIAGNOSIS — Z86010 Personal history of colonic polyps: Secondary | ICD-10-CM

## 2014-09-18 DIAGNOSIS — E041 Nontoxic single thyroid nodule: Principal | ICD-10-CM | POA: Diagnosis present

## 2014-09-18 DIAGNOSIS — C50919 Malignant neoplasm of unspecified site of unspecified female breast: Secondary | ICD-10-CM

## 2014-09-18 DIAGNOSIS — Z9013 Acquired absence of bilateral breasts and nipples: Secondary | ICD-10-CM | POA: Diagnosis present

## 2014-09-18 DIAGNOSIS — M810 Age-related osteoporosis without current pathological fracture: Secondary | ICD-10-CM | POA: Diagnosis present

## 2014-09-18 DIAGNOSIS — E1122 Type 2 diabetes mellitus with diabetic chronic kidney disease: Secondary | ICD-10-CM | POA: Diagnosis present

## 2014-09-18 DIAGNOSIS — C50311 Malignant neoplasm of lower-inner quadrant of right female breast: Secondary | ICD-10-CM

## 2014-09-18 DIAGNOSIS — Z79899 Other long term (current) drug therapy: Secondary | ICD-10-CM

## 2014-09-18 DIAGNOSIS — E785 Hyperlipidemia, unspecified: Secondary | ICD-10-CM | POA: Diagnosis present

## 2014-09-18 DIAGNOSIS — C50812 Malignant neoplasm of overlapping sites of left female breast: Secondary | ICD-10-CM

## 2014-09-18 DIAGNOSIS — E780 Pure hypercholesterolemia: Secondary | ICD-10-CM | POA: Diagnosis present

## 2014-09-18 DIAGNOSIS — R34 Anuria and oliguria: Secondary | ICD-10-CM | POA: Diagnosis not present

## 2014-09-18 DIAGNOSIS — Z9889 Other specified postprocedural states: Secondary | ICD-10-CM

## 2014-09-18 DIAGNOSIS — K5900 Constipation, unspecified: Secondary | ICD-10-CM | POA: Diagnosis not present

## 2014-09-18 DIAGNOSIS — I82621 Acute embolism and thrombosis of deep veins of right upper extremity: Secondary | ICD-10-CM | POA: Diagnosis not present

## 2014-09-18 DIAGNOSIS — D638 Anemia in other chronic diseases classified elsewhere: Secondary | ICD-10-CM | POA: Diagnosis present

## 2014-09-18 DIAGNOSIS — Y828 Other medical devices associated with adverse incidents: Secondary | ICD-10-CM | POA: Diagnosis present

## 2014-09-18 DIAGNOSIS — Z87891 Personal history of nicotine dependence: Secondary | ICD-10-CM

## 2014-09-18 DIAGNOSIS — I422 Other hypertrophic cardiomyopathy: Secondary | ICD-10-CM | POA: Diagnosis present

## 2014-09-18 DIAGNOSIS — Q238 Other congenital malformations of aortic and mitral valves: Secondary | ICD-10-CM

## 2014-09-18 DIAGNOSIS — J439 Emphysema, unspecified: Secondary | ICD-10-CM | POA: Diagnosis not present

## 2014-09-18 DIAGNOSIS — K219 Gastro-esophageal reflux disease without esophagitis: Secondary | ICD-10-CM | POA: Diagnosis present

## 2014-09-18 DIAGNOSIS — Z90710 Acquired absence of both cervix and uterus: Secondary | ICD-10-CM

## 2014-09-18 DIAGNOSIS — Z7901 Long term (current) use of anticoagulants: Secondary | ICD-10-CM

## 2014-09-18 DIAGNOSIS — R1310 Dysphagia, unspecified: Secondary | ICD-10-CM | POA: Diagnosis present

## 2014-09-18 DIAGNOSIS — J3801 Paralysis of vocal cords and larynx, unilateral: Secondary | ICD-10-CM | POA: Diagnosis present

## 2014-09-18 DIAGNOSIS — I472 Ventricular tachycardia: Secondary | ICD-10-CM | POA: Diagnosis not present

## 2014-09-18 DIAGNOSIS — Z8711 Personal history of peptic ulcer disease: Secondary | ICD-10-CM

## 2014-09-18 DIAGNOSIS — Z6820 Body mass index (BMI) 20.0-20.9, adult: Secondary | ICD-10-CM

## 2014-09-18 DIAGNOSIS — Z853 Personal history of malignant neoplasm of breast: Secondary | ICD-10-CM

## 2014-09-18 LAB — COMPREHENSIVE METABOLIC PANEL (CC13)
ALK PHOS: 78 U/L (ref 40–150)
ALT: 18 U/L (ref 0–55)
ANION GAP: 7 meq/L (ref 3–11)
AST: 27 U/L (ref 5–34)
Albumin: 3.3 g/dL — ABNORMAL LOW (ref 3.5–5.0)
BUN: 31.2 mg/dL — ABNORMAL HIGH (ref 7.0–26.0)
CALCIUM: 11.8 mg/dL — AB (ref 8.4–10.4)
CHLORIDE: 106 meq/L (ref 98–109)
CO2: 30 mEq/L — ABNORMAL HIGH (ref 22–29)
Creatinine: 1.7 mg/dL — ABNORMAL HIGH (ref 0.6–1.1)
EGFR: 32 mL/min/{1.73_m2} — AB (ref >90)
GLUCOSE: 94 mg/dL (ref 70–140)
Potassium: 4 mEq/L (ref 3.5–5.1)
SODIUM: 143 meq/L (ref 136–145)
TOTAL PROTEIN: 6.8 g/dL (ref 6.4–8.3)
Total Bilirubin: 0.68 mg/dL (ref 0.20–1.20)

## 2014-09-18 LAB — CBC WITH DIFFERENTIAL/PLATELET
BASO%: 2 % (ref 0.0–2.0)
BASOS ABS: 0.1 10*3/uL (ref 0.0–0.1)
EOS%: 5.8 % (ref 0.0–7.0)
Eosinophils Absolute: 0.3 10*3/uL (ref 0.0–0.5)
HEMATOCRIT: 29.4 % — AB (ref 34.8–46.6)
HGB: 9.6 g/dL — ABNORMAL LOW (ref 11.6–15.9)
LYMPH#: 0.6 10*3/uL — AB (ref 0.9–3.3)
LYMPH%: 13.3 % — ABNORMAL LOW (ref 14.0–49.7)
MCH: 28.7 pg (ref 25.1–34.0)
MCHC: 32.7 g/dL (ref 31.5–36.0)
MCV: 87.9 fL (ref 79.5–101.0)
MONO#: 0.6 10*3/uL (ref 0.1–0.9)
MONO%: 13.3 % (ref 0.0–14.0)
NEUT%: 65.6 % (ref 38.4–76.8)
NEUTROS ABS: 2.9 10*3/uL (ref 1.5–6.5)
PLATELETS: 319 10*3/uL (ref 145–400)
RBC: 3.34 10*6/uL — ABNORMAL LOW (ref 3.70–5.45)
RDW: 16.8 % — AB (ref 11.2–14.5)
WBC: 4.5 10*3/uL (ref 3.9–10.3)

## 2014-09-18 LAB — GLUCOSE, CAPILLARY: Glucose-Capillary: 94 mg/dL (ref 65–99)

## 2014-09-18 MED ORDER — FLUDEOXYGLUCOSE F - 18 (FDG) INJECTION
5.0000 | Freq: Once | INTRAVENOUS | Status: AC | PRN
  Administered 2014-09-18: 5 via INTRAVENOUS

## 2014-09-19 ENCOUNTER — Observation Stay (HOSPITAL_COMMUNITY): Payer: Commercial Managed Care - HMO

## 2014-09-19 ENCOUNTER — Other Ambulatory Visit: Payer: Self-pay

## 2014-09-19 ENCOUNTER — Emergency Department (HOSPITAL_COMMUNITY): Payer: Commercial Managed Care - HMO

## 2014-09-19 ENCOUNTER — Inpatient Hospital Stay (HOSPITAL_COMMUNITY)
Admission: EM | Admit: 2014-09-19 | Discharge: 2014-10-03 | DRG: 643 | Disposition: A | Discharge status: Home Health | Payer: Commercial Managed Care - HMO | Attending: Internal Medicine | Admitting: Internal Medicine

## 2014-09-19 ENCOUNTER — Encounter (HOSPITAL_COMMUNITY): Payer: Self-pay | Admitting: Emergency Medicine

## 2014-09-19 DIAGNOSIS — E785 Hyperlipidemia, unspecified: Secondary | ICD-10-CM | POA: Diagnosis not present

## 2014-09-19 DIAGNOSIS — J90 Pleural effusion, not elsewhere classified: Secondary | ICD-10-CM

## 2014-09-19 DIAGNOSIS — J3800 Paralysis of vocal cords and larynx, unspecified: Secondary | ICD-10-CM

## 2014-09-19 DIAGNOSIS — I480 Paroxysmal atrial fibrillation: Secondary | ICD-10-CM | POA: Insufficient documentation

## 2014-09-19 DIAGNOSIS — R609 Edema, unspecified: Secondary | ICD-10-CM

## 2014-09-19 DIAGNOSIS — E049 Nontoxic goiter, unspecified: Secondary | ICD-10-CM

## 2014-09-19 DIAGNOSIS — N183 Chronic kidney disease, stage 3 unspecified: Secondary | ICD-10-CM | POA: Diagnosis present

## 2014-09-19 DIAGNOSIS — I10 Essential (primary) hypertension: Secondary | ICD-10-CM | POA: Diagnosis not present

## 2014-09-19 DIAGNOSIS — R0602 Shortness of breath: Secondary | ICD-10-CM

## 2014-09-19 DIAGNOSIS — R0600 Dyspnea, unspecified: Secondary | ICD-10-CM | POA: Diagnosis not present

## 2014-09-19 DIAGNOSIS — Z9889 Other specified postprocedural states: Secondary | ICD-10-CM

## 2014-09-19 DIAGNOSIS — R490 Dysphonia: Secondary | ICD-10-CM

## 2014-09-19 DIAGNOSIS — Z452 Encounter for adjustment and management of vascular access device: Secondary | ICD-10-CM

## 2014-09-19 DIAGNOSIS — C50311 Malignant neoplasm of lower-inner quadrant of right female breast: Secondary | ICD-10-CM | POA: Diagnosis present

## 2014-09-19 DIAGNOSIS — I959 Hypotension, unspecified: Secondary | ICD-10-CM

## 2014-09-19 DIAGNOSIS — E43 Unspecified severe protein-calorie malnutrition: Secondary | ICD-10-CM | POA: Diagnosis present

## 2014-09-19 DIAGNOSIS — I5033 Acute on chronic diastolic (congestive) heart failure: Secondary | ICD-10-CM

## 2014-09-19 DIAGNOSIS — E1129 Type 2 diabetes mellitus with other diabetic kidney complication: Secondary | ICD-10-CM | POA: Diagnosis present

## 2014-09-19 DIAGNOSIS — I2510 Atherosclerotic heart disease of native coronary artery without angina pectoris: Secondary | ICD-10-CM

## 2014-09-19 DIAGNOSIS — R1310 Dysphagia, unspecified: Secondary | ICD-10-CM

## 2014-09-19 DIAGNOSIS — J948 Other specified pleural conditions: Secondary | ICD-10-CM | POA: Diagnosis not present

## 2014-09-19 HISTORY — DX: Pleural effusion, not elsewhere classified: J90

## 2014-09-19 HISTORY — DX: Type 2 diabetes mellitus with other diabetic kidney complication: E11.29

## 2014-09-19 HISTORY — DX: Paralysis of vocal cords and larynx, unspecified: J38.00

## 2014-09-19 HISTORY — DX: Dysphonia: R49.0

## 2014-09-19 HISTORY — DX: Nontoxic goiter, unspecified: E04.9

## 2014-09-19 LAB — BODY FLUID CELL COUNT WITH DIFFERENTIAL
Eos, Fluid: 0 %
LYMPHS FL: 53 %
MONOCYTE-MACROPHAGE-SEROUS FLUID: 45 % — AB (ref 50–90)
Neutrophil Count, Fluid: 2 % (ref 0–25)
WBC FLUID: 1008 uL — AB (ref 0–1000)

## 2014-09-19 LAB — CBC WITH DIFFERENTIAL/PLATELET
BASOS ABS: 0.1 10*3/uL (ref 0.0–0.1)
BASOS PCT: 2 % — AB (ref 0–1)
Eosinophils Absolute: 0.5 10*3/uL (ref 0.0–0.7)
Eosinophils Relative: 11 % — ABNORMAL HIGH (ref 0–5)
HCT: 28.2 % — ABNORMAL LOW (ref 36.0–46.0)
Hemoglobin: 9.1 g/dL — ABNORMAL LOW (ref 12.0–15.0)
LYMPHS ABS: 0.6 10*3/uL — AB (ref 0.7–4.0)
Lymphocytes Relative: 13 % (ref 12–46)
MCH: 28.5 pg (ref 26.0–34.0)
MCHC: 32.3 g/dL (ref 30.0–36.0)
MCV: 88.4 fL (ref 78.0–100.0)
MONO ABS: 0.8 10*3/uL (ref 0.1–1.0)
Monocytes Relative: 18 % — ABNORMAL HIGH (ref 3–12)
NEUTROS ABS: 2.4 10*3/uL (ref 1.7–7.7)
NEUTROS PCT: 56 % (ref 43–77)
PLATELETS: 328 10*3/uL (ref 150–400)
RBC: 3.19 MIL/uL — AB (ref 3.87–5.11)
RDW: 16.4 % — ABNORMAL HIGH (ref 11.5–15.5)
WBC: 4.3 10*3/uL (ref 4.0–10.5)

## 2014-09-19 LAB — PROTEIN, BODY FLUID: Total protein, fluid: <3 g/dL

## 2014-09-19 LAB — COMPREHENSIVE METABOLIC PANEL
ALBUMIN: 3.4 g/dL — AB (ref 3.5–5.0)
ALK PHOS: 70 U/L (ref 38–126)
ALT: 18 U/L (ref 14–54)
ANION GAP: 8 (ref 5–15)
AST: 26 U/L (ref 15–41)
BUN: 30 mg/dL — ABNORMAL HIGH (ref 6–20)
CALCIUM: 11.1 mg/dL — AB (ref 8.9–10.3)
CO2: 29 mmol/L (ref 22–32)
Chloride: 103 mmol/L (ref 101–111)
Creatinine, Ser: 1.52 mg/dL — ABNORMAL HIGH (ref 0.44–1.00)
GFR calc non Af Amer: 31 mL/min — ABNORMAL LOW (ref >60)
GFR, EST AFRICAN AMERICAN: 36 mL/min — AB (ref >60)
GLUCOSE: 99 mg/dL (ref 65–99)
Potassium: 3.5 mmol/L (ref 3.5–5.1)
SODIUM: 140 mmol/L (ref 135–145)
TOTAL PROTEIN: 7 g/dL (ref 6.5–8.1)
Total Bilirubin: 0.8 mg/dL (ref 0.3–1.2)

## 2014-09-19 LAB — GLUCOSE, SEROUS FLUID: Glucose, Fluid: 99 mg/dL

## 2014-09-19 LAB — GLUCOSE, CAPILLARY
GLUCOSE-CAPILLARY: 96 mg/dL (ref 65–99)
Glucose-Capillary: 71 mg/dL (ref 65–99)
Glucose-Capillary: 83 mg/dL (ref 65–99)

## 2014-09-19 LAB — LACTATE DEHYDROGENASE, PLEURAL OR PERITONEAL FLUID: LD, Fluid: 66 U/L — ABNORMAL HIGH (ref 3–23)

## 2014-09-19 LAB — I-STAT TROPONIN, ED: TROPONIN I, POC: 0.07 ng/mL (ref 0.00–0.08)

## 2014-09-19 LAB — CBG MONITORING, ED: GLUCOSE-CAPILLARY: 81 mg/dL (ref 65–99)

## 2014-09-19 LAB — BRAIN NATRIURETIC PEPTIDE: B NATRIURETIC PEPTIDE 5: 769.3 pg/mL — AB (ref 0.0–100.0)

## 2014-09-19 MED ORDER — RACEPINEPHRINE HCL 2.25 % IN NEBU
0.5000 mL | INHALATION_SOLUTION | RESPIRATORY_TRACT | Status: DC | PRN
  Administered 2014-09-19 – 2014-09-20 (×3): 0.5 mL via RESPIRATORY_TRACT
  Filled 2014-09-19 (×4): qty 0.5

## 2014-09-19 MED ORDER — ATORVASTATIN CALCIUM 10 MG PO TABS
10.0000 mg | ORAL_TABLET | Freq: Every day | ORAL | Status: DC
  Administered 2014-09-19 – 2014-10-02 (×14): 10 mg via ORAL
  Filled 2014-09-19 (×17): qty 1

## 2014-09-19 MED ORDER — ALBUTEROL SULFATE (2.5 MG/3ML) 0.083% IN NEBU
INHALATION_SOLUTION | RESPIRATORY_TRACT | Status: AC
  Filled 2014-09-19: qty 3

## 2014-09-19 MED ORDER — INSULIN ASPART 100 UNIT/ML ~~LOC~~ SOLN
0.0000 [IU] | Freq: Three times a day (TID) | SUBCUTANEOUS | Status: DC
  Administered 2014-09-21: 3 [IU] via SUBCUTANEOUS
  Administered 2014-09-21 (×2): 2 [IU] via SUBCUTANEOUS
  Administered 2014-09-24: 3 [IU] via SUBCUTANEOUS
  Administered 2014-09-25 – 2014-09-28 (×2): 2 [IU] via SUBCUTANEOUS

## 2014-09-19 MED ORDER — INSULIN ASPART 100 UNIT/ML ~~LOC~~ SOLN: 0.0000 [IU] | Freq: Every day | SUBCUTANEOUS | Status: DC

## 2014-09-19 MED ORDER — LINAGLIPTIN 5 MG PO TABS
5.0000 mg | ORAL_TABLET | Freq: Every day | ORAL | Status: DC
  Administered 2014-09-19 – 2014-10-03 (×14): 5 mg via ORAL
  Filled 2014-09-19 (×16): qty 1

## 2014-09-19 MED ORDER — FERROUS SULFATE 325 (65 FE) MG PO TABS
325.0000 mg | ORAL_TABLET | Freq: Three times a day (TID) | ORAL | Status: DC
Start: 2014-09-19 — End: 2014-10-03
  Administered 2014-09-19 – 2014-10-03 (×38): 325 mg via ORAL
  Filled 2014-09-19 (×49): qty 1

## 2014-09-19 MED ORDER — MORPHINE SULFATE 2 MG/ML IJ SOLN
2.0000 mg | INTRAMUSCULAR | Status: DC | PRN
  Administered 2014-09-22 (×2): 2 mg via INTRAVENOUS
  Filled 2014-09-19 (×2): qty 1

## 2014-09-19 MED ORDER — ALBUTEROL SULFATE (2.5 MG/3ML) 0.083% IN NEBU
5.0000 mg | INHALATION_SOLUTION | Freq: Once | RESPIRATORY_TRACT | Status: AC
  Administered 2014-09-19: 5 mg via RESPIRATORY_TRACT
  Filled 2014-09-19: qty 6

## 2014-09-19 MED ORDER — ANASTROZOLE 1 MG PO TABS
1.0000 mg | ORAL_TABLET | Freq: Every day | ORAL | Status: DC
  Administered 2014-09-20 – 2014-10-03 (×13): 1 mg via ORAL
  Filled 2014-09-19 (×17): qty 1

## 2014-09-19 MED ORDER — ALBUTEROL SULFATE (2.5 MG/3ML) 0.083% IN NEBU
5.0000 mg | INHALATION_SOLUTION | Freq: Once | RESPIRATORY_TRACT | Status: DC
  Filled 2014-09-19: qty 6

## 2014-09-19 MED ORDER — ONDANSETRON HCL 4 MG/2ML IJ SOLN
4.0000 mg | Freq: Four times a day (QID) | INTRAMUSCULAR | Status: DC | PRN
  Administered 2014-09-19: 4 mg via INTRAVENOUS
  Filled 2014-09-19: qty 2

## 2014-09-19 MED ORDER — LOSARTAN POTASSIUM 50 MG PO TABS
100.0000 mg | ORAL_TABLET | Freq: Every day | ORAL | Status: DC
  Administered 2014-09-19: 100 mg via ORAL
  Filled 2014-09-19 (×2): qty 2

## 2014-09-19 MED ORDER — ONDANSETRON HCL 4 MG PO TABS: 4.0000 mg | ORAL_TABLET | Freq: Four times a day (QID) | ORAL | Status: DC | PRN

## 2014-09-19 MED ORDER — ACETAMINOPHEN 650 MG RE SUPP: 650.0000 mg | Freq: Four times a day (QID) | RECTAL | Status: DC | PRN

## 2014-09-19 MED ORDER — ACETAMINOPHEN 325 MG PO TABS
650.0000 mg | ORAL_TABLET | Freq: Four times a day (QID) | ORAL | Status: DC | PRN
  Administered 2014-09-27 – 2014-10-02 (×4): 650 mg via ORAL
  Filled 2014-09-19 (×6): qty 2

## 2014-09-19 NOTE — ED Notes (Signed)
Bed: NS25 Expected date:  Expected time:  Means of arrival:  Comments: SOB

## 2014-09-19 NOTE — Procedures (Signed)
US guided diagnostic/ therapeutic right thoracentesis performed yielding 1.2 liters hazy, amber fluid. A portion of the fluid was sent to the lab for preordered studies. F/u CXR pending. No immediate complications.

## 2014-09-19 NOTE — ED Notes (Signed)
Patient transported to XR. 

## 2014-09-19 NOTE — ED Notes (Signed)
Per EMS pt recently discharged with bronchitis and CHF; pt complaint of worsening exertional SOB onset yesterday.

## 2014-09-19 NOTE — ED Notes (Signed)
Pt sat levels at 98 while ambulating. Notified Dr Ralene Bathe

## 2014-09-19 NOTE — ED Notes (Signed)
Pt transported to DG at present time; will administer nebulizer treatment with pt return.

## 2014-09-19 NOTE — ED Provider Notes (Signed)
CSN: [id number]     Arrival date & time 09/19/14  0735 History   First MD Initiated Contact with Patient 09/19/14 0736     Chief Complaint  Patient presents with  . Shortness of Breath     Patient is a 81 y.o. female presenting with shortness of breath. The history is provided by the patient.  Shortness of Breath  Alison Gross presents for evaluation of shortness of breath. She reports 3 weeks of increasing shortness of breath with nonproductive cough. She has occasional chest tightness. Symptoms were worse today. She denies any fevers, vomiting. She has mild lower extremity edema since yesterday. She has a history of pleural effusion status post thoracentesis. The symptoms are different and worse than her prior symptoms. She feels like she can't adequately clear her throat. Symptoms are moderate, constant, worsening. She takes Xarelto for atrial fibrillation.  Past Medical History  Diagnosis Date  . Hypercholesterolemia     takes Crestor daily  . Thyroid disease     had iodine radiation  . Hypertension     takes Hyzaar daily  . Dysrhythmia     takes Carvedilol daily  . Pneumonia     history of;last time about 4-77yr ago  . GERD (gastroesophageal reflux disease)     takes Protonix daily  . Gastric ulcer   . History of blood transfusion   . History of colon polyps   . Anemia     takes iron pill daily  . Diabetes mellitus without complication     takes Tradjenta daily  . History of radiation therapy 07/12/12-08/26/12    right breast/  . Paroxysmal atrial fibrillation     PCP EKG 09/27/2013: A. Fibrillation.. EKG 09/29/2013: S. Tach.  . Breast cancer 04/12/12    right-pos lymph node/left-DCIS  . Shortness of breath on exertion   . Bruit of left carotid artery   . Acute upper GI bleeding 01/24/2012  . MGUS (monoclonal gammopathy of unknown significance) 04/21/2013  . Osteoporosis 11/29/2013  . Recurrent right pleural effusion 07/26/2014  . Stage III chronic kidney disease 07/26/2014   . Goiter 07/27/2014   Past Surgical History  Procedure Laterality Date  . Carpal tunnel release      left  . Breast biopsy    . Esophagogastroduodenoscopy  01/24/2012    Procedure: ESOPHAGOGASTRODUODENOSCOPY (EGD);  Surgeon: MJeryl Columbia MD;  [facility];  Service: Endoscopy;  Laterality: N/A;  . Thyroid goiter removed    . Cyst removed from back of neck    . Knee surgery      right with rods  . Esophagogastroduodenoscopy    . Colonoscopy    . Total mastectomy Bilateral 05/10/2012    Procedure: RIGHT modified mastectomy; LEFT total mastectomy;  Surgeon: CHaywood Lasso MD;  [place] [facility]  Service: General;  Laterality: Bilateral;  . Axillary sentinel node biopsy Right 05/10/2012    Procedure: AXILLARY SENTINEL lymph  NODE BIOPSY;  Surgeon: CHaywood Lasso MD;  [place] [place]  Service: General;  Laterality: Right;  nuclear medicine injection right side  7:00 am   . Abdominal hysterectomy      fibroids, with bilateral SO   Family History  Problem Relation Age of Onset  . Brain cancer Mother   . Aneurysm Father   . Diabetes Sister   . Diabetes Brother   . Diabetes Sister   . Diabetes Brother   . Heart failure Sister    History  Substance Use Topics  .  Smoking status: Former Smoker -- 0.00 packs/day    Types: Cigarettes    Quit date: 07/26/1971  . Smokeless tobacco: Never Used  . Alcohol Use: No   OB History    No data available     Review of Systems  Respiratory: Positive for shortness of breath.   All other systems reviewed and are negative.     Allergies  Review of patient's allergies indicates no known allergies.  Home Medications   Prior to Admission medications   Medication Sig Start Date End Date Taking? Authorizing Provider  acetaminophen (TYLENOL) 500 MG tablet Take 500 mg by mouth every 6 (six) hours as needed for moderate pain or headache.    Historical Provider, MD  anastrozole (ARIMIDEX) 1 MG tablet TAKE 1 TABLET (1 MG TOTAL)  BY MOUTH DAILY. Patient taking differently: Take 1 mg by mouth daily with breakfast.  07/18/14   Laurie Panda, NP  atorvastatin (LIPITOR) 10 MG tablet Take 1 tablet (10 mg total) by mouth daily at 6 PM. 08/25/14   Janece Canterbury, MD  cycloSPORINE (RESTASIS) 0.05 % ophthalmic emulsion Place 1 drop into both eyes 2 (two) times daily as needed (dry eyes).    Historical Provider, MD  feeding supplement, ENSURE ENLIVE, (ENSURE ENLIVE) LIQD Take 237 mLs by mouth 2 (two) times daily between meals. 07/28/14   Venetia Maxon Rama, MD  ferrous sulfate 325 (65 FE) MG tablet Take 1 tablet (325 mg total) by mouth 3 (three) times daily with meals. 08/25/14   Janece Canterbury, MD  levofloxacin (LEVAQUIN) 750 MG tablet Take 1 tablet (750 mg total) by mouth every other day. 08/25/14   Janece Canterbury, MD  linagliptin (TRADJENTA) 5 MG TABS tablet Take 5 mg by mouth daily.    Historical Provider, MD  Multiple Vitamin (MULTIVITAMIN WITH MINERALS) TABS Take 1 tablet by mouth every other day.     Historical Provider, MD  Rivaroxaban (XARELTO) 15 MG TABS tablet Take 1 tablet (15 mg total) by mouth daily. 08/25/14   Janece Canterbury, MD   BP 142/65 mmHg  Pulse 97  Temp(Src) 98.2 F (36.8 C) (Oral)  Resp 13  SpO2 97% Physical Exam  Constitutional: She is oriented to person, place, and time. She appears well-developed and well-nourished.  HENT:  Head: Normocephalic and atraumatic.  Cardiovascular: Normal rate and regular rhythm.   No murmur heard. Pulmonary/Chest: Effort normal. No respiratory distress.  Decreased air movement bilaterally with occasional wheezes.  Abdominal: Soft. There is no tenderness. There is no rebound and no guarding.  Musculoskeletal: She exhibits no edema or tenderness.  Neurological: She is alert and oriented to person, place, and time.  Skin: Skin is warm and dry.  Psychiatric: She has a normal mood and affect. Her behavior is normal.  Nursing note and vitals reviewed.   ED Course   Procedures (including critical care time) Labs Review Labs Reviewed  COMPREHENSIVE METABOLIC PANEL - Abnormal; Notable for the following:    BUN 30 (*)    Creatinine, Ser 1.52 (*)    Calcium 11.1 (*)    Albumin 3.4 (*)    GFR calc non Af Amer 31 (*)    GFR calc Af Amer 36 (*)    All other components within normal limits  CBC WITH DIFFERENTIAL/PLATELET - Abnormal; Notable for the following:    RBC 3.19 (*)    Hemoglobin 9.1 (*)    HCT 28.2 (*)    RDW 16.4 (*)    Lymphs Abs 0.6 (*)  Monocytes Relative 18 (*)    Eosinophils Relative 11 (*)    Basophils Relative 2 (*)    All other components within normal limits  BRAIN NATRIURETIC PEPTIDE - Abnormal; Notable for the following:    B Natriuretic Peptide 769.3 (*)    All other components within normal limits  I-STAT TROPOININ, ED  CBG MONITORING, ED    Imaging Review Nm Pet Image Restag (ps) Skull Base To Thigh  09/18/2014   CLINICAL DATA:  Subsequent treatment strategy for breast carcinoma.  EXAM: NUCLEAR MEDICINE PET SKULL BASE TO THIGH  TECHNIQUE: 5.0 mCi F-18 FDG was injected intravenously. Full-ring PET imaging was performed from the skull base to thigh after the radiotracer. CT data was obtained and used for attenuation correction and anatomic localization.  FASTING BLOOD GLUCOSE:  Value: 94 mg/dl  COMPARISON:  By 10/28/2013 pleura PET-CT  FINDINGS: NECK  The thyroid gland is massively enlarged with a large portion extending into the right hemi thorax. There is variable metabolic activity scattered throughout this enlarged gland which is to similar prior.  CHEST  There is a moderate-sized right pleural effusion. No hypermetabolic mediastinal lymph nodes. No hypermetabolic axillary nodes. No hypermetabolic pulmonary nodules.  ABDOMEN/PELVIS  No abnormal hypermetabolic activity within the liver, pancreas, adrenal glands, or spleen. No hypermetabolic lymph nodes in the abdomen or pelvis.  SKELETON  No focal hypermetabolic activity to  suggest skeletal metastasis.  IMPRESSION: 1. No evidence of breast cancer recurrence or metastasis. 2. Moderate to large right pleural effusion. 3. Small free fluid in the pelvis. 4. Massive thyroid goiter extending into the right hemi thorax.   Electronically Signed   By: Suzy Bouchard M.D.   On: 09/18/2014 13:32     EKG Interpretation   Date/Time:  Tuesday September 19 2014 07:52:30 EDT Ventricular Rate:  94 PR Interval:  162 QRS Duration: 81 QT Interval:  374 QTC Calculation: 468 R Axis:   -37 Text Interpretation:  Sinus rhythm Left axis deviation Anterior infarct,  old Nonspecific T abnormalities, lateral leads Confirmed by Hazle Coca  [phone]) on 09/19/2014 8:46:54 AM      MDM   Final diagnoses:  Dyspnea  Pleural effusion on right    Patient with history of goiter, chronic renal insufficiency, anemia here with progressive shortness of breath. Patient has recurrent right-sided pleural effusion that may be contributing to her symptoms. There is no evidence of acute infectious process at this time. Patient is on Xarelto, clinical picture is not consistent with PE, ACS, acute CHF. Patient with normal vital signs but does have significant dyspnea with minimal exertion. Discussed with hospitalist regarding observation for thoracentesis given patient is too dyspneic to go home.     Quintella Reichert, MD 09/19/14 1236

## 2014-09-19 NOTE — ED Notes (Signed)
Pt transported to CT ?

## 2014-09-19 NOTE — H&P (Signed)
[facility] History and Physical  Alison Gross TDD:[id number] DOB: September 29, 1932 DOA: 09/19/2014  Referring physician: Emergency Department PCP: Maximino Greenland, MD  Specialists: Dr. Jana Hakim  Chief Complaint: SOB  HPI: Alison Gross is a 81 y.o. female  With a hx of goiter, HLD, afib on chronic anticoagulation, HTN, DM2 on tradjenta and hx of recurrent R sided pleural effusions who presents to the ED with subjectively worsening sob over the past several days. Pt is s/p thoracentesis 6/16 yielding 800cc of borderline exudative fluid, thought more likely transudative at that time. In ED, chest imaging demonstrated moderate R sided effusion. Pt remained without hypoxia or tachypnea. Hospitalist consulted for admission.  Of note, patient does have a known goiter. Family reports pt has been having more difficulty swallowing foods recently.  Review of Systems:  Review of Systems  Constitutional: Negative for fever, chills and malaise/fatigue.  HENT: Negative for ear pain and tinnitus.   Eyes: Negative for pain and discharge.  Respiratory: Positive for shortness of breath. Negative for hemoptysis, wheezing and stridor.   Cardiovascular: Negative for orthopnea and claudication.  Gastrointestinal: Negative for nausea and vomiting.  Genitourinary: Negative for frequency and hematuria.  Musculoskeletal: Negative for back pain and neck pain.  Neurological: Negative for tremors, seizures and loss of consciousness.  Psychiatric/Behavioral: Negative for memory loss and substance abuse.    Past Medical History  Diagnosis Date  . Hypercholesterolemia     takes Crestor daily  . Thyroid disease     had iodine radiation  . Hypertension     takes Hyzaar daily  . Dysrhythmia     takes Carvedilol daily  . Pneumonia     history of;last time about 4-25yr ago  . GERD (gastroesophageal reflux disease)     takes Protonix daily  . Gastric ulcer   . History of blood transfusion   . History of  colon polyps   . Anemia     takes iron pill daily  . Diabetes mellitus without complication     takes Tradjenta daily  . History of radiation therapy 07/12/12-08/26/12    right breast/  . Paroxysmal atrial fibrillation     PCP EKG 09/27/2013: A. Fibrillation.. EKG 09/29/2013: S. Tach.  . Breast cancer 04/12/12    right-pos lymph node/left-DCIS  . Shortness of breath on exertion   . Bruit of left carotid artery   . Acute upper GI bleeding 01/24/2012  . MGUS (monoclonal gammopathy of unknown significance) 04/21/2013  . Osteoporosis 11/29/2013  . Recurrent right pleural effusion 07/26/2014  . Stage III chronic kidney disease 07/26/2014  . Goiter 07/27/2014   Past Surgical History  Procedure Laterality Date  . Carpal tunnel release      left  . Breast biopsy    . Esophagogastroduodenoscopy  01/24/2012    Procedure: ESOPHAGOGASTRODUODENOSCOPY (EGD);  Surgeon: MJeryl Columbia MD;  [facility];  Service: Endoscopy;  Laterality: N/A;  . Thyroid goiter removed    . Cyst removed from back of neck    . Knee surgery      right with rods  . Esophagogastroduodenoscopy    . Colonoscopy    . Total mastectomy Bilateral 05/10/2012    Procedure: RIGHT modified mastectomy; LEFT total mastectomy;  Surgeon: CHaywood Lasso MD;  [facility]  Service: General;  Laterality: Bilateral;  . Axillary sentinel node biopsy Right 05/10/2012    Procedure: AXILLARY SENTINEL lymph  NODE BIOPSY;  Surgeon: CHaywood Lasso MD;  [facility]  Service:  General;  Laterality: Right;  nuclear medicine injection right side  7:00 am   . Abdominal hysterectomy      fibroids, with bilateral SO   Social History:  reports that she quit smoking about 43 years ago. Her smoking use included Cigarettes. She smoked 0.00 packs per day. She has never used smokeless tobacco. She reports that she does not drink alcohol or use illicit drugs.  where does patient live--home, ALF, SNF? and with whom if at home?  Can patient  participate in ADLs?  No Known Allergies  Family History  Problem Relation Age of Onset  . Brain cancer Mother   . Aneurysm Father   . Diabetes Sister   . Diabetes Brother   . Diabetes Sister   . Diabetes Brother   . Heart failure Sister     (be sure to complete)  Prior to Admission medications   Medication Sig Start Date End Date Taking? Authorizing Provider  acetaminophen (TYLENOL) 500 MG tablet Take 500 mg by mouth every 6 (six) hours as needed for moderate pain or headache.   Yes Historical Provider, MD  anastrozole (ARIMIDEX) 1 MG tablet TAKE 1 TABLET (1 MG TOTAL) BY MOUTH DAILY. Patient taking differently: Take 1 mg by mouth daily with breakfast.  07/18/14  Yes Laurie Panda, NP  atorvastatin (LIPITOR) 10 MG tablet Take 1 tablet (10 mg total) by mouth daily at 6 PM. 08/25/14  Yes Janece Canterbury, MD  feeding supplement, ENSURE ENLIVE, (ENSURE ENLIVE) LIQD Take 237 mLs by mouth 2 (two) times daily between meals. 07/28/14  Yes Venetia Maxon Rama, MD  ferrous sulfate 325 (65 FE) MG tablet Take 1 tablet (325 mg total) by mouth 3 (three) times daily with meals. 08/25/14  Yes Janece Canterbury, MD  furosemide (LASIX) 40 MG tablet Take 40 mg by mouth daily as needed for fluid or edema.  07/28/14  Yes Historical Provider, MD  linagliptin (TRADJENTA) 5 MG TABS tablet Take 5 mg by mouth daily.   Yes Historical Provider, MD  losartan (COZAAR) 100 MG tablet Take 100 mg by mouth daily. 08/28/14  Yes Historical Provider, MD  Multiple Vitamin (MULTIVITAMIN WITH MINERALS) TABS Take 1 tablet by mouth every other day.    Yes Historical Provider, MD  Rivaroxaban (XARELTO) 15 MG TABS tablet Take 1 tablet (15 mg total) by mouth daily. 08/25/14  Yes Janece Canterbury, MD  levofloxacin (LEVAQUIN) 750 MG tablet Take 1 tablet (750 mg total) by mouth every other day. Patient not taking: Reported on 09/19/2014 08/25/14   Janece Canterbury, MD   Physical Exam: Filed Vitals:   09/19/14 0828 09/19/14 0925 09/19/14 1016  09/19/14 1119  BP: 140/64 121/65 130/59 129/61  Pulse: 92 94 98 96  Temp:    97.8 F (36.6 C)  TempSrc:    Oral  Resp: '20 18  18  '$ SpO2: 100% 100% 100% 100%     General:  Awake, in nad  Eyes: PERRL B  ENT: membranes moist, dentition fair  Neck: trachea midline, palpable goiter  Cardiovascular: regular, s1, s2  Respiratory: normal resp effort, no wheezing, decreased BS on R  Abdomen: soft,nondistended  Skin: normal skin turgor, no abnormal skin lesions seen  Musculoskeletal: perfused, no clubbing  Psychiatric: mood/affect normal// no auditory/visual hallucinations  Neurologic: cn2-12 grossly intact, strength/sensation intact  Labs on Admission:  Basic Metabolic Panel:  Recent Labs Lab 09/18/14 0946 09/19/14 0750  NA 143 140  K 4.0 3.5  CL  --  103  CO2 30*  29  GLUCOSE 94 99  BUN 31.2* 30*  CREATININE 1.7* 1.52*  CALCIUM 11.8* 11.1*   Liver Function Tests:  Recent Labs Lab 09/18/14 0946 09/19/14 0750  AST 27 26  ALT 18 18  ALKPHOS 78 70  BILITOT 0.68 0.8  PROT 6.8 7.0  ALBUMIN 3.3* 3.4*   No results for input(s): LIPASE, AMYLASE in the last 168 hours. No results for input(s): AMMONIA in the last 168 hours. CBC:  Recent Labs Lab 09/18/14 0946 09/19/14 0750  WBC 4.5 4.3  NEUTROABS 2.9 2.4  HGB 9.6* 9.1*  HCT 29.4* 28.2*  MCV 87.9 88.4  PLT 319 328   Cardiac Enzymes: No results for input(s): CKTOTAL, CKMB, CKMBINDEX, TROPONINI in the last 168 hours.  BNP (last 3 results)  Recent Labs  07/26/14 1010 09/19/14 0750  BNP 1007.4* 769.3*    ProBNP (last 3 results) No results for input(s): PROBNP in the last 8760 hours.  CBG:  Recent Labs Lab 09/18/14 0749  GLUCAP 94    Radiological Exams on Admission: Dg Chest 2 View  09/19/2014   CLINICAL DATA:  Increasing shortness of breath. Wheezing. Onset of symptoms last night.  EXAM: CHEST  2 VIEW  COMPARISON:  CT 09/18/2014.  FINDINGS: RIGHT paratracheal mass compatible with substernal  goiter. Moderate RIGHT pleural effusion with compressive atelectasis. These findings appear similar to the CT yesterday. Aortic arch atherosclerosis. Axillary surgical clips bilaterally. Monitoring leads project over the chest. Cardiopericardial silhouette appears unchanged compared to prior chest radiographs. Lung volumes are lower than on previous study 08/24/2014.  IMPRESSION: 1. Moderate RIGHT pleural effusion with compressive/relaxation atelectasis of the RIGHT lower lobe and RIGHT middle lobe. 2. Large RIGHT peritracheal mass compatible with substernal goiter evaluated on prior CT. 3. Lower lung volumes than on prior chest radiographs.   Electronically Signed   By: Dereck Ligas M.D.   On: 09/19/2014 08:11   Ct Chest Wo Contrast  09/19/2014   CLINICAL DATA:  Right breast cancer. Increasing shortness of breath.  EXAM: CT CHEST WITHOUT CONTRAST  TECHNIQUE: Multidetector CT imaging of the chest was performed following the standard protocol without IV contrast.  COMPARISON:  Multiple exams, including 09/18/2014 and 09/19/2014  FINDINGS: Mediastinum/Nodes: Prominent thyroid goiter with massively enlarged right lobe extending into the right hemithorax and measuring approximately 13.9 by 8.0 by 7.1 cm.  Diffuse subcutaneous edema. Aortic arch and branch vessel atherosclerosis. Low-level stranding in the mediastinum with small to moderate pericardial effusion.  Lungs/Pleura: Right pleural effusion occupies greater than 50% of right hemithoracic volume and is associated with passive atelectasis.  Centrilobular emphysema. Old granulomatous disease. Trace left pleural effusion.  Upper abdomen: Stranding in the intra-abdominal adipose tissue.  Musculoskeletal: Thoracic spondylosis.  IMPRESSION: 1. Considerable third spacing of fluid with subcutaneous, mediastinal, and upper abdominal infiltrative edema. 2. Large right and trace left pleural effusions. 3. Very large thyroid goiter, with masslike extension of the right  lobe into the right hemithorax as detailed on prior exams. 4. Centrilobular emphysema.   Electronically Signed   By: Van Clines M.D.   On: 09/19/2014 09:57   Nm Pet Image Restag (ps) Skull Base To Thigh  09/18/2014   CLINICAL DATA:  Subsequent treatment strategy for breast carcinoma.  EXAM: NUCLEAR MEDICINE PET SKULL BASE TO THIGH  TECHNIQUE: 5.0 mCi F-18 FDG was injected intravenously. Full-ring PET imaging was performed from the skull base to thigh after the radiotracer. CT data was obtained and used for attenuation correction and anatomic localization.  FASTING BLOOD  GLUCOSE:  Value: 94 mg/dl  COMPARISON:  By 10/28/2013 pleura PET-CT  FINDINGS: NECK  The thyroid gland is massively enlarged with a large portion extending into the right hemi thorax. There is variable metabolic activity scattered throughout this enlarged gland which is to similar prior.  CHEST  There is a moderate-sized right pleural effusion. No hypermetabolic mediastinal lymph nodes. No hypermetabolic axillary nodes. No hypermetabolic pulmonary nodules.  ABDOMEN/PELVIS  No abnormal hypermetabolic activity within the liver, pancreas, adrenal glands, or spleen. No hypermetabolic lymph nodes in the abdomen or pelvis.  SKELETON  No focal hypermetabolic activity to suggest skeletal metastasis.  IMPRESSION: 1. No evidence of breast cancer recurrence or metastasis. 2. Moderate to large right pleural effusion. 3. Small free fluid in the pelvis. 4. Massive thyroid goiter extending into the right hemi thorax.   Electronically Signed   By: Suzy Bouchard M.D.   On: 09/18/2014 13:32   Assessment/Plan Principal Problem:   Pleural effusion, right Active Problems:   HTN (hypertension)   Primary cancer of lower-inner quadrant of right female breast   Dyslipidemia   Type 2 diabetes mellitus with renal manifestations   Dyspnea   1. Recurrent R pleural effusion 1. Not hypoxic, without tachypnea 2. Mod R sided effusion on CT chest 3. Will  consult IR for consideration for repeat thoracentesis 4. Hold xarelto for now and will check INR 5. Admit med-surg 2. HTN 1. BP stable 2. Cont monitor 3. DM2 1. Will cont SSI coverage 2. Cont home tradjenta 4. Hx R sided breast cancer 1. Have informed Dr. Jana Hakim of pt's admission 2. Pt has scheduled f/u appt on 7/14 5. HLD 1. Cont statin 6. Afib 1. Rate controlled 2. Holding xarelto per above and would resume following thoracentesis or when OK per IR 7. DVT prophylaxis 1. SCD's 8. Goiter 1. Known process 2. Pt reports increased dysphagia recently 3. Will consult SLP   Code Status: Full (must indicate code status--if unknown or must be presumed, indicate so) Family Communication: Pt in room, family at bedside (indicate person spoken with, if applicable, with phone number if by telephone) Disposition Plan: admit med-surg (indicate anticipated LOS)   [facility], [facility] Pager [phone]  If 7PM-7AM, please contact night-coverage [email or web address] Password [facility] 09/19/2014, 11:37 AM

## 2014-09-19 NOTE — ED Notes (Signed)
Rees at bedside. 

## 2014-09-20 ENCOUNTER — Observation Stay (HOSPITAL_COMMUNITY): Payer: Commercial Managed Care - HMO

## 2014-09-20 ENCOUNTER — Telehealth: Payer: Self-pay | Admitting: *Deleted

## 2014-09-20 ENCOUNTER — Ambulatory Visit (HOSPITAL_COMMUNITY): Payer: Commercial Managed Care - HMO

## 2014-09-20 DIAGNOSIS — E1129 Type 2 diabetes mellitus with other diabetic kidney complication: Secondary | ICD-10-CM | POA: Diagnosis not present

## 2014-09-20 DIAGNOSIS — E785 Hyperlipidemia, unspecified: Secondary | ICD-10-CM | POA: Diagnosis not present

## 2014-09-20 DIAGNOSIS — J948 Other specified pleural conditions: Secondary | ICD-10-CM | POA: Diagnosis not present

## 2014-09-20 DIAGNOSIS — I959 Hypotension, unspecified: Secondary | ICD-10-CM | POA: Diagnosis not present

## 2014-09-20 DIAGNOSIS — R1310 Dysphagia, unspecified: Secondary | ICD-10-CM

## 2014-09-20 DIAGNOSIS — R490 Dysphonia: Secondary | ICD-10-CM | POA: Diagnosis not present

## 2014-09-20 HISTORY — DX: Dysphonia: R49.0

## 2014-09-20 LAB — COMPREHENSIVE METABOLIC PANEL
ALK PHOS: 63 U/L (ref 38–126)
ALT: 16 U/L (ref 14–54)
ALT: 14 U/L (ref 14–54)
ANION GAP: 6 (ref 5–15)
AST: 21 U/L (ref 15–41)
AST: 23 U/L (ref 15–41)
Albumin: 2.9 g/dL — ABNORMAL LOW (ref 3.5–5.0)
Albumin: 3 g/dL — ABNORMAL LOW (ref 3.5–5.0)
Alkaline Phosphatase: 63 U/L (ref 38–126)
Anion gap: 5 (ref 5–15)
BILIRUBIN TOTAL: 0.8 mg/dL (ref 0.3–1.2)
BUN: 26 mg/dL — ABNORMAL HIGH (ref 6–20)
BUN: 24 mg/dL — ABNORMAL HIGH (ref 6–20)
CALCIUM: 11 mg/dL — AB (ref 8.9–10.3)
CALCIUM: 10.6 mg/dL — AB (ref 8.9–10.3)
CHLORIDE: 110 mmol/L (ref 101–111)
CO2: 29 mmol/L (ref 22–32)
CO2: 29 mmol/L (ref 22–32)
CREATININE: 1.77 mg/dL — AB (ref 0.44–1.00)
Chloride: 108 mmol/L (ref 101–111)
Creatinine, Ser: 1.34 mg/dL — ABNORMAL HIGH (ref 0.44–1.00)
GFR calc Af Amer: 42 mL/min — ABNORMAL LOW (ref >60)
GFR calc Af Amer: 30 mL/min — ABNORMAL LOW (ref >60)
GFR calc non Af Amer: 37 mL/min — ABNORMAL LOW (ref >60)
GFR calc non Af Amer: 26 mL/min — ABNORMAL LOW (ref >60)
Glucose, Bld: 89 mg/dL (ref 65–99)
Glucose, Bld: 117 mg/dL — ABNORMAL HIGH (ref 65–99)
POTASSIUM: 3.9 mmol/L (ref 3.5–5.1)
Potassium: 4 mmol/L (ref 3.5–5.1)
Sodium: 145 mmol/L (ref 135–145)
Sodium: 142 mmol/L (ref 135–145)
Total Bilirubin: 0.7 mg/dL (ref 0.3–1.2)
Total Protein: 6.1 g/dL — ABNORMAL LOW (ref 6.5–8.1)
Total Protein: 6.5 g/dL (ref 6.5–8.1)

## 2014-09-20 LAB — TYPE AND SCREEN
ABO/RH(D): O POS
ANTIBODY SCREEN: NEGATIVE

## 2014-09-20 LAB — CBC
HCT: 26.3 % — ABNORMAL LOW (ref 36.0–46.0)
HEMATOCRIT: 28 % — AB (ref 36.0–46.0)
HEMOGLOBIN: 8.5 g/dL — AB (ref 12.0–15.0)
Hemoglobin: 8.7 g/dL — ABNORMAL LOW (ref 12.0–15.0)
MCH: 28.7 pg (ref 26.0–34.0)
MCH: 27.7 pg (ref 26.0–34.0)
MCHC: 32.3 g/dL (ref 30.0–36.0)
MCHC: 31.1 g/dL (ref 30.0–36.0)
MCV: 88.9 fL (ref 78.0–100.0)
MCV: 89.2 fL (ref 78.0–100.0)
PLATELETS: 296 10*3/uL (ref 150–400)
Platelets: 310 10*3/uL (ref 150–400)
RBC: 2.96 MIL/uL — ABNORMAL LOW (ref 3.87–5.11)
RBC: 3.14 MIL/uL — AB (ref 3.87–5.11)
RDW: 16.5 % — ABNORMAL HIGH (ref 11.5–15.5)
RDW: 16.4 % — AB (ref 11.5–15.5)
WBC: 4.9 10*3/uL (ref 4.0–10.5)
WBC: 4.6 10*3/uL (ref 4.0–10.5)

## 2014-09-20 LAB — LACTIC ACID, PLASMA
Lactic Acid, Venous: 1.6 mmol/L (ref 0.5–2.0)
Lactic Acid, Venous: 1.6 mmol/L (ref 0.5–2.0)

## 2014-09-20 LAB — MRSA PCR SCREENING: MRSA by PCR: NEGATIVE

## 2014-09-20 LAB — TROPONIN I
TROPONIN I: 0.33 ng/mL — AB (ref <0.031)
TROPONIN I: 0.33 ng/mL — AB (ref <0.031)

## 2014-09-20 LAB — T4, FREE: FREE T4: 1.09 ng/dL (ref 0.61–1.12)

## 2014-09-20 LAB — APTT: aPTT: 37 seconds (ref 24–37)

## 2014-09-20 LAB — GLUCOSE, CAPILLARY
GLUCOSE-CAPILLARY: 116 mg/dL — AB (ref 65–99)
GLUCOSE-CAPILLARY: 95 mg/dL (ref 65–99)

## 2014-09-20 LAB — CORTISOL: CORTISOL PLASMA: 9.9 ug/dL

## 2014-09-20 LAB — PROTIME-INR
INR: 1.62 — AB (ref 0.00–1.49)
PROTHROMBIN TIME: 19.3 s — AB (ref 11.6–15.2)

## 2014-09-20 LAB — AMYLASE, PLEURAL FLUID: Amylase, Pleural Fluid: 25 U/L

## 2014-09-20 LAB — HEPARIN LEVEL (UNFRACTIONATED): HEPARIN UNFRACTIONATED: 0.97 [IU]/mL — AB (ref 0.30–0.70)

## 2014-09-20 LAB — BRAIN NATRIURETIC PEPTIDE: B Natriuretic Peptide: 808.4 pg/mL — ABNORMAL HIGH (ref 0.0–100.0)

## 2014-09-20 MED ORDER — HEPARIN (PORCINE) IN NACL 100-0.45 UNIT/ML-% IJ SOLN
800.0000 [IU]/h | INTRAMUSCULAR | Status: DC
  Administered 2014-09-20: 800 [IU]/h via INTRAVENOUS
  Filled 2014-09-20: qty 250

## 2014-09-20 MED ORDER — HYDROCORTISONE NA SUCCINATE PF 100 MG IJ SOLR
100.0000 mg | Freq: Once | INTRAMUSCULAR | Status: AC
  Administered 2014-09-20: 100 mg via INTRAVENOUS
  Filled 2014-09-20: qty 2

## 2014-09-20 MED ORDER — FAMOTIDINE IN NACL 20-0.9 MG/50ML-% IV SOLN
20.0000 mg | Freq: Two times a day (BID) | INTRAVENOUS | Status: DC
  Administered 2014-09-21 (×2): 20 mg via INTRAVENOUS
  Filled 2014-09-20 (×2): qty 50

## 2014-09-20 MED ORDER — SODIUM CHLORIDE 0.9 % IV SOLN
1.5000 g | Freq: Two times a day (BID) | INTRAVENOUS | Status: DC
  Administered 2014-09-20 – 2014-09-21 (×2): 1.5 g via INTRAVENOUS
  Filled 2014-09-20 (×3): qty 1.5

## 2014-09-20 MED ORDER — SODIUM CHLORIDE 0.9 % IV BOLUS (SEPSIS)
1000.0000 mL | Freq: Once | INTRAVENOUS | Status: AC
  Administered 2014-09-20: 1000 mL via INTRAVENOUS

## 2014-09-20 MED ORDER — HYDROCORTISONE NA SUCCINATE PF 100 MG IJ SOLR
50.0000 mg | Freq: Four times a day (QID) | INTRAMUSCULAR | Status: DC
  Administered 2014-09-21 – 2014-09-22 (×7): 50 mg via INTRAVENOUS
  Filled 2014-09-20 (×2): qty 2
  Filled 2014-09-20 (×2): qty 1
  Filled 2014-09-20 (×5): qty 2

## 2014-09-20 MED ORDER — SODIUM CHLORIDE 0.9 % IV SOLN
INTRAVENOUS | Status: DC
  Administered 2014-09-20 – 2014-09-22 (×3): via INTRAVENOUS

## 2014-09-20 MED ORDER — DEXTROSE 5 % IV SOLN
0.0000 ug/min | INTRAVENOUS | Status: DC
  Administered 2014-09-21: 5 ug/min via INTRAVENOUS
  Filled 2014-09-20: qty 4

## 2014-09-20 MED ORDER — RIVAROXABAN 15 MG PO TABS
15.0000 mg | ORAL_TABLET | Freq: Every day | ORAL | Status: DC
  Filled 2014-09-20: qty 1

## 2014-09-20 MED ORDER — IPRATROPIUM-ALBUTEROL 0.5-2.5 (3) MG/3ML IN SOLN
3.0000 mL | Freq: Two times a day (BID) | RESPIRATORY_TRACT | Status: DC
Start: 2014-09-20 — End: 2014-09-21
  Administered 2014-09-20 – 2014-09-21 (×2): 3 mL via RESPIRATORY_TRACT
  Filled 2014-09-20 (×2): qty 3

## 2014-09-20 MED ORDER — SODIUM CHLORIDE 0.9 % IV BOLUS (SEPSIS): 1000.0000 mL | Freq: Once | INTRAVENOUS | Status: DC

## 2014-09-20 MED ORDER — ALBUTEROL SULFATE (2.5 MG/3ML) 0.083% IN NEBU
2.5000 mg | INHALATION_SOLUTION | RESPIRATORY_TRACT | Status: DC | PRN
  Administered 2014-09-20 – 2014-09-30 (×10): 2.5 mg via RESPIRATORY_TRACT
  Filled 2014-09-20 (×9): qty 3

## 2014-09-20 MED ORDER — SODIUM CHLORIDE 0.9 % IV BOLUS (SEPSIS): 500.0000 mL | Freq: Once | INTRAVENOUS | Status: DC

## 2014-09-20 NOTE — Progress Notes (Signed)
ANTICOAGULATION CONSULT NOTE - Initial Consult  Pharmacy Consult for heparin Indication: hx Afib, r/o VTE  No Known Allergies  Patient Measurements: Height: '5\' 5"'$  (165.1 cm) Weight: 113 lb 1.6 oz (51.302 kg) IBW/kg (Calculated) : 57  Heparin weight: 52 kg  Vital Signs: Temp: 98.9 F (37.2 C) (07/13 1413) Temp Source: Oral (07/13 1413) BP: 77/44 mmHg (07/13 1708) Pulse Rate: 112 (07/13 1708)  Labs:  Recent Labs  09/19/14 0750 09/20/14 0355 09/20/14 1510  HGB 9.1* 8.5* 8.7*  HCT 28.2* 26.3* 28.0*  PLT 328 296 310  LABPROT  --  19.3*  --   INR  --  1.62*  --   CREATININE 1.52* 1.34* 1.77*  TROPONINI  --   --  0.33*    Estimated Creatinine Clearance: 20.9 mL/min (by C-G formula based on Cr of 1.77).   Medical History: Past Medical History  Diagnosis Date  . Hypercholesterolemia     takes Crestor daily  . Thyroid disease     had iodine radiation  . Hypertension     takes Hyzaar daily  . Dysrhythmia     takes Carvedilol daily  . Pneumonia     history of;last time about 4-24yr ago  . GERD (gastroesophageal reflux disease)     takes Protonix daily  . Gastric ulcer   . History of blood transfusion   . History of colon polyps   . Anemia     takes iron pill daily  . Diabetes mellitus without complication     takes Tradjenta daily  . History of radiation therapy 07/12/12-08/26/12    right breast/  . Paroxysmal atrial fibrillation     PCP EKG 09/27/2013: A. Fibrillation.. EKG 09/29/2013: S. Tach.  . Breast cancer 04/12/12    right-pos lymph node/left-DCIS  . Shortness of breath on exertion   . Bruit of left carotid artery   . Acute upper GI bleeding 01/24/2012  . MGUS (monoclonal gammopathy of unknown significance) 04/21/2013  . Osteoporosis 11/29/2013  . Recurrent right pleural effusion 07/26/2014  . Stage III chronic kidney disease 07/26/2014  . Goiter 07/27/2014    Assessment: Patient's a 81y.o F on xarelto PTA for hx Afib.  Xarelto has been placed on hold  since admission (last dose taken on 09/18/14 at 2100) d/t thoracentesis for R pleural effusion-- s/p thoracentesis on 09/19/14. Patient continues to have low O2 sats s/p procedure. To resume anticoagulation today per Dr. SSheran Favafor hx Afib and to r/o PE.  With scr trending up to 1.77 today (est. Crcl~21), patient is not a candidate for VTE xarelto treatment therapy-- will start heparin therapy until renal function improves.  Goal of Therapy:  Heparin level 0.3-0.7 units/ml aPTT 66-102 seconds Monitor platelets by anticoagulation protocol: Yes   Plan:  - heparin 800 units/hr (no bolus d/t recent thoracentesis) - baseline heparin level and aPTT - check 8 hour heparin level and aPTT - monitor s/s bleeding  Azalie Harbeck P 09/20/2014,5:20 PM

## 2014-09-20 NOTE — Progress Notes (Signed)
RN paged this NP secondary to pt's BP still being low after 3L of boluses (which started on days) and 150cc/hr maintenance fluids for a few hours. BP 70s down to upper 60s at one point. Another 1L bolus started and in the meantime, this NP called PCCM for a consult. Shortly thereafter, Dr. Lamonte Sakai here for consult. Stopping Heparin drip for central line placement and probable start of pressors. Pt has remained alert and talkative and has made urine. Given IVFs and pressors, will place a Foley for accurate I&O. Suspect PCCM will take over care, but will continue to follow until transfer. At 2200hrs, bolus running and BP is in the 90s at this time. Called and spoke to pt's husband and informed him of BP issues, critical care consult and change in plan of care.  Clance Boll, NP [facility]

## 2014-09-20 NOTE — Consult Note (Signed)
PULMONARY / CRITICAL CARE MEDICINE   Name: Alison Gross MRN: [id number] DOB: 03-Jan-1933    ADMISSION DATE:  09/19/2014 CONSULTATION DATE:  09/20/14  REFERRING MD :  Jerilynn Mages. Short  REASON FOR CONSULTATION:  Shock   INITIAL PRESENTATION: 81 yo woman w hx B breast CA, large R goiter that impacts the R hemithorax and recurrent R pleural effusions (borderline exudative 08/2014), A Fib on Xarelto, DM. Admitted with dyspnea 7/12 and underwent 1.2L R thoracentesis. On 7/13 has been in shock with normal mentation, UOP has not been well quantified. Trop 0.33. Moved to ICU after failure to respond to IVF resuscitation.  PCCM consulted to evaluate 7/13 pm.   STUDIES:  TTE 7/13 >>   SIGNIFICANT EVENTS: R thora 08/24/14 800cc R thora 09/19/14 1200cc  HISTORY OF PRESENT ILLNESS:  81 yo woman w hx B breast CA, large goiter that impacts the R hemithorax and recurrent R pleural effusions (borderline exudative 08/2014), A Fib on Xarelto, DM. Admitted with dyspnea 7/12 and underwent 1.2L R thoracentesis. Also mentioned some evolving dysphagia, ? Related to goiter. Her swallow eval on 7/13 confirmed obstruction and aspiration.  On 7/13 has been in shock with normal mentation, UOP has not been well quantified. Moved to ICU after failure to respond to IVF resuscitation. Trop 0.33. PCCM consulted to evaluate 7/13 pm.   PAST MEDICAL HISTORY :   has a past medical history of Hypercholesterolemia; Thyroid disease; Hypertension; Dysrhythmia; Pneumonia; GERD (gastroesophageal reflux disease); Gastric ulcer; History of blood transfusion; History of colon polyps; Anemia; Diabetes mellitus without complication; History of radiation therapy (07/12/12-08/26/12); Paroxysmal atrial fibrillation; Breast cancer (04/12/12); Shortness of breath on exertion; Bruit of left carotid artery; Acute upper GI bleeding (01/24/2012); MGUS (monoclonal gammopathy of unknown significance) (04/21/2013); Osteoporosis (11/29/2013); Recurrent right pleural  effusion (07/26/2014); Stage III chronic kidney disease (07/26/2014); and Goiter (07/27/2014).  has past surgical history that includes Carpal tunnel release; Breast biopsy; Esophagogastroduodenoscopy (01/24/2012); thyroid goiter removed; cyst removed from back of neck; Knee surgery; Esophagogastroduodenoscopy; Colonoscopy; Total mastectomy (Bilateral, 05/10/2012); Axillary sentinel node biopsy (Right, 05/10/2012); and Abdominal hysterectomy. Prior to Admission medications   Medication Sig Start Date End Date Taking? Authorizing Provider  acetaminophen (TYLENOL) 500 MG tablet Take 500 mg by mouth every 6 (six) hours as needed for moderate pain or headache.   Yes Historical Provider, MD  anastrozole (ARIMIDEX) 1 MG tablet TAKE 1 TABLET (1 MG TOTAL) BY MOUTH DAILY. Patient taking differently: Take 1 mg by mouth daily with breakfast.  07/18/14  Yes Laurie Panda, NP  atorvastatin (LIPITOR) 10 MG tablet Take 1 tablet (10 mg total) by mouth daily at 6 PM. 08/25/14  Yes Janece Canterbury, MD  feeding supplement, ENSURE ENLIVE, (ENSURE ENLIVE) LIQD Take 237 mLs by mouth 2 (two) times daily between meals. 07/28/14  Yes Venetia Maxon Rama, MD  ferrous sulfate 325 (65 FE) MG tablet Take 1 tablet (325 mg total) by mouth 3 (three) times daily with meals. 08/25/14  Yes Janece Canterbury, MD  furosemide (LASIX) 40 MG tablet Take 40 mg by mouth daily as needed for fluid or edema.  07/28/14  Yes Historical Provider, MD  linagliptin (TRADJENTA) 5 MG TABS tablet Take 5 mg by mouth daily.   Yes Historical Provider, MD  losartan (COZAAR) 100 MG tablet Take 100 mg by mouth daily. 08/28/14  Yes Historical Provider, MD  Multiple Vitamin (MULTIVITAMIN WITH MINERALS) TABS Take 1 tablet by mouth every other day.    Yes Historical Provider, MD  Rivaroxaban Alveda Reasons)  15 MG TABS tablet Take 1 tablet (15 mg total) by mouth daily. 08/25/14  Yes Janece Canterbury, MD  levofloxacin (LEVAQUIN) 750 MG tablet Take 1 tablet (750 mg total) by mouth every  other day. Patient not taking: Reported on 09/19/2014 08/25/14   Janece Canterbury, MD   No Known Allergies  FAMILY HISTORY:  indicated that her mother is deceased. She indicated that her father is deceased. She indicated that her sister is alive. She indicated that her brother is alive. She indicated that her maternal grandmother is deceased. She indicated that her maternal grandfather is deceased. She indicated that her paternal grandmother is deceased. She indicated that her paternal grandfather is deceased.  SOCIAL HISTORY:  reports that she quit smoking about 43 years ago. Her smoking use included Cigarettes. She smoked 0.00 packs per day. She has never used smokeless tobacco. She reports that she does not drink alcohol or use illicit drugs.  REVIEW OF SYSTEMS:  Feels weak, has some wheezing  SUBJECTIVE:  C/o some UA noise and wheeze  VITAL SIGNS: Temp:  [98.2 F (36.8 C)-98.9 F (37.2 C)] 98.5 F (36.9 C) (07/13 2015) Pulse Rate:  [98-115] 102 (07/13 2030) Resp:  [16-27] 23 (07/13 2030) BP: (69-98)/(43-50) 69/48 mmHg (07/13 2030) SpO2:  [91 %-100 %] 100 % (07/13 2030) Weight:  [51.302 kg (113 lb 1.6 oz)] 51.302 kg (113 lb 1.6 oz) (07/13 0551) HEMODYNAMICS:   VENTILATOR SETTINGS:   INTAKE / OUTPUT:  Intake/Output Summary (Last 24 hours) at 09/20/14 2115 Last data filed at 09/20/14 1755  Gross per 24 hour  Intake   2710 ml  Output      3 ml  Net   2707 ml    PHYSICAL EXAMINATION: General:  Thin woman, weak but in no distress Neuro:  Awake, non-focal HEENT:  OP clear, significant UA rhonchi Cardiovascular:  Tachy, regular, no M Lungs:  Clear on L, decreased on R, referred UA noise Abdomen:  Soft, benign Musculoskeletal:  No lesions, no edema Skin:  No rash  LABS:  CBC  Recent Labs Lab 09/19/14 0750 09/20/14 0355 09/20/14 1510  WBC 4.3 4.9 4.6  HGB 9.1* 8.5* 8.7*  HCT 28.2* 26.3* 28.0*  PLT 328 296 310   Coag's  Recent Labs Lab 09/20/14 0355  09/20/14 2007  APTT  --  37  INR 1.62*  --    BMET  Recent Labs Lab 09/19/14 0750 09/20/14 0355 09/20/14 1510  NA 140 145 142  K 3.5 3.9 4.0  CL 103 110 108  CO2 '29 29 29  '$ BUN 30* 26* 24*  CREATININE 1.52* 1.34* 1.77*  GLUCOSE 99 89 117*   Electrolytes  Recent Labs Lab 09/19/14 0750 09/20/14 0355 09/20/14 1510  CALCIUM 11.1* 11.0* 10.6*   Sepsis Markers  Recent Labs Lab 09/20/14 1510 09/20/14 1745  LATICACIDVEN 1.6 1.6   ABG No results for input(s): PHART, PCO2ART, PO2ART in the last 168 hours. Liver Enzymes  Recent Labs Lab 09/19/14 0750 09/20/14 0355 09/20/14 1510  AST '26 21 23  '$ ALT '18 16 14  '$ ALKPHOS 70 63 63  BILITOT 0.8 0.8 0.7  ALBUMIN 3.4* 2.9* 3.0*   Cardiac Enzymes  Recent Labs Lab 09/20/14 1510 09/20/14 1745  TROPONINI 0.33* 0.33*   Glucose  Recent Labs Lab 09/19/14 1157 09/19/14 1308 09/19/14 1725 09/19/14 2045 09/20/14 0846 09/20/14 1205  GLUCAP 81 71 83 96 116* 95    Imaging Dg Chest Port 1 View  09/20/2014   CLINICAL DATA:  81 year old female  status post right thoracentesis  EXAM: PORTABLE CHEST - 1 VIEW  COMPARISON:  Prior chest x-ray 09/19/2014  FINDINGS: Stable cardiomegaly. Atherosclerotic calcification again noted in the transverse aorta. Similar appearance of large right paratracheal mass. There may be slight interval reaccumulation of the right-sided pleural effusion. New patchy opacity in the right lung base. Inspiratory volumes are slightly lower. The left lung remains relatively clear. Surgical changes suggest prior bilateral mastectomies. No acute osseous abnormality.  IMPRESSION: 1. Interval development of patchy airspace opacity in the right lung base which may represent atelectasis, infiltrate or aspiration in the appropriate clinical setting. 2. Perhaps trace interval reaccumulation of right pleural fluid. 3. Stable right paratracheal mass.   Electronically Signed   By: Jacqulynn Cadet M.D.   On: 09/20/2014 14:45      ASSESSMENT / PLAN:  PULMONARY A: UA obstruction and wheeze, suspect due to impact of goiter on trachea. CT chest does not show any tracheal narrowing to level of the mediastinum Recurrent R effusion. Suspect that this is due to the impact of goiter on her R hemithorax.  Possible aspiration PNA P:   - agree with neck CT scan when she is stable to undergo - do not see any report of goiter biopsy, R chest lesion biopsy. This will need to be pursued - abx as below - duoneb   CARDIOVASCULAR CVL pending A: Shock, most likely septic given suspected aspiration event. Consider hypovolemic or cardiogenic given mild troponin elevation A fib P:  - stop heparin for procedure - place CVC for CVP's, pressors if needed - stress dose steroids initiated - follow troponins - follow TTE result  RENAL A:  Acute renal failure, likely ATN P:   - follow BMP and UOP with volume and correction hemodynamics  GASTROINTESTINAL A:  Acid prophylaxis P:   - pepcid   HEMATOLOGIC A:  No acute issues P:  - follow CBC as indicated  INFECTIOUS A:  Suspected aspiration PNA P:   BCx2 7/13 >>  R pleural fluid 7/12 >>   Unasyn 7/13 >>   ENDOCRINE A:  DM   P:   - SSI - Tradjenta  NEUROLOGIC A:  No acute issues P:   RASS goal: 0   FAMILY  - Updates: Updated patient, husband, daughter on 7/13pm  - Inter-disciplinary family meet or Palliative Care meeting due by: 09/27/14    TODAY'S SUMMARY:     Independent CC time 9 minutes  Baltazar Apo, MD, PhD 09/20/2014, 10:58 PM [facility] [phone] or if no answer [phone]

## 2014-09-20 NOTE — Progress Notes (Signed)
[facility] [facility] Progress Note Patient Name: Alison Gross DOB: 01/09/1933 MRN: [id number]   Date of Service  09/20/2014  HPI/Events of Note  Pt accepted to ICU for hypotension. Not seen, BP responded to fluids. Afib rate controlled.   eICU Interventions  Ordered echo, labs, continue fluids.          Laverle Hobby 09/20/2014, 8:26 PM

## 2014-09-20 NOTE — Progress Notes (Addendum)
[facility] PROGRESS NOTE  Alison Gross EUM:[id number] DOB: November 22, 1932 DOA: 09/19/2014 PCP: Maximino Greenland, MD  Brief Summary  Alison Gross is a 81 y.o. female with a hx of goiter, HLD, afib on chronic anticoagulation, HTN, DM2 on tradjenta and hx of recurrent R sided pleural effusions who presents to the ED with subjectively worsening sob over the past several days. Pt is s/p thoracentesis 6/16 yielding 800cc of borderline exudative fluid, thought more likely transudative at that time.  In ED, chest imaging demonstrated moderate R sided effusion.  Of note, patient does have a known goiter. Family reported pt had been having more difficulty swallowing foods recently.    Assessment/Plan  Hypotension, differential diagnosis includes pulmonary embolism, aspiration pneumonia, adrenal insufficiency,  Intravascular hypovolemia secondary to large volume thoracentesis yesterday.   Hemoglobin is stable and chest x-ray demonstrates only a small residual effusion on the right, so post-procedure hemothorax is less likely.  No PTX on CXR. -   Received 2 L IV fluids, third liter pending -   Cortisol level : Pending -   Start hydrocortisone -    Start heparin drip for possible pulmonary embolism since off of anticoagulation for more than 24 hours -   Transfer to stepdown -   Critical care consultation -   Blood cultures obtained secondary to possible aspiration pneumonia despite normal white count and no fever -    Started Unasyn for possible aspiration pneumonia -   Currently has peripheral IV only -   Hold all blood pressure medications -  Consider trial of midodrine -  Consider VQ scan   Recurrent right pleural effusion -   Status post thoracentesis by interventional radiology on 7/12 -   Previous cytology demonstrated only reactive mesothelial cells, no evidence of malignancy -   I suspect this may be secondary to pleural irritation from her goiter -   She will possibly need regular  dosed Lasix at home to prevent reaccumulation once medically stable -  F/u culture   Elevated troponin, chest pain-free, EKG demonstrates no acute ST segment elevation or T-wave inversions. She did previously have some T-wave inversions in the lateral leads and some mild ST segment elevations in the anterior leads which are stable. -   Cycle troponins -   Telemetry  Chronic diastolic heart failure -  Hold lasix   Paroxysmal atrial fibrillation -  HR trending up but not because any rate control medications were held -  Start heparin gtt   Diabetes mellitus type 2 with well controlled blood sugars, A1c 6.2 last month -  Continue mod dose SSI  Right-sided breast cancer in remission, no evidence of recurrence on recent PET scan -  Continue arimidex   Hyperlipidemia, stable, continue atorvastatin   Goiter, large and compressing the right lung and likely contributing to her swallowing and speech difficulties -  TSH 1.115 on 6/15 -  Check fT3 and fT4   Dysphasia and dysphonia, likely secondary to large goiter -   Swallow evaluation : dysphagia 1 diet -   CT of the neck ordered, but holding off all blood pressure is low -   Consider inpatient ENT consultation pending CT findings   CKD stage IV,  Creatinine clearance less than 30 , creatinine 1.7 -   Minimize nephrotoxicity and renally dose medications  Hypercalcemia secondary to malignancy and possible thyroid abnormalities (nonparathyroid hypercalcemia) -  Vitamin D 54, PTHrp wnl, PTH < 10 -  Avoid calcium and vitamin D supplementation -  Consider bisphosphonate -  No lasix while hypotensive  Iron deficiency anemia superimposed on MGUS. - continue iron supplementation - B12, folate, and TSH wnl  Diet:  Dysphagia 1 Access:  PIV IVF:  yes Proph:  Heparin gtt  Code Status: full  Family Communication: patient, her husband and multiple family members in room Disposition Plan: transfer to stepdown for persistent  hypotension   Consultants:  PCCM  IR  Procedures:  Thoracentesis of right pleural effusion by IR on 09/19/2014  CT soft tissue neck pending    Antibiotics:  Unasyn 7/13 >   HPI/Subjective:  Feel sleepy but otherwise  Denies lightheadedness, chest pains, shortness of breath,  Nausea, vomiting, diarrhea. She continues to have a hoarse voice and difficulty swallowing. She has been choking on her food and liquid.  Objective: Filed Vitals:   09/20/14 1430 09/20/14 1548 09/20/14 1649 09/20/14 1708  BP: 82/50 86/50  77/44  Pulse:  103  112  Temp:      TempSrc:      Resp:      Height:      Weight:      SpO2:   91%     Intake/Output Summary (Last 24 hours) at 09/20/14 1726 Last data filed at 09/20/14 1620  Gross per 24 hour  Intake   2660 ml  Output      3 ml  Net   2657 ml   Filed Weights   09/19/14 1300 09/20/14 0551  Weight: 52.164 kg (115 lb) 51.302 kg (113 lb 1.6 oz)   Body mass index is 18.82 kg/(m^2).  Exam:   General:   Cachectic female,No acute distress  HEENT:  NCAT, MMM  Cardiovascular:  IRRR, nl S1, S2 no mrg, 2+ pulses, warm extremities  Respiratory:   Still somewhat diminished on the right side to the apex, no focal rales or rhonchi, no increased WOB  Abdomen:   NABS, soft, NT/ND  MSK:   Normal tone and bulk, no LEE  Neuro:  Grossly intact  Data Reviewed: Basic Metabolic Panel:  Recent Labs Lab 09/18/14 0946 09/19/14 0750 09/20/14 0355 09/20/14 1510  NA 143 140 145 142  K 4.0 3.5 3.9 4.0  CL  --  103 110 108  CO2 30* '29 29 29  '$ GLUCOSE 94 99 89 117*  BUN 31.2* 30* 26* 24*  CREATININE 1.7* 1.52* 1.34* 1.77*  CALCIUM 11.8* 11.1* 11.0* 10.6*   Liver Function Tests:  Recent Labs Lab 09/18/14 0946 09/19/14 0750 09/20/14 0355 09/20/14 1510  AST '27 26 21 23  '$ ALT '18 18 16 14  '$ ALKPHOS 78 70 63 63  BILITOT 0.68 0.8 0.8 0.7  PROT 6.8 7.0 6.1* 6.5  ALBUMIN 3.3* 3.4* 2.9* 3.0*   No results for input(s): LIPASE, AMYLASE in the  last 168 hours. No results for input(s): AMMONIA in the last 168 hours. CBC:  Recent Labs Lab 09/18/14 0946 09/19/14 0750 09/20/14 0355 09/20/14 1510  WBC 4.5 4.3 4.9 4.6  NEUTROABS 2.9 2.4  --   --   HGB 9.6* 9.1* 8.5* 8.7*  HCT 29.4* 28.2* 26.3* 28.0*  MCV 87.9 88.4 88.9 89.2  PLT 319 328 296 310    Recent Results (from the past 240 hour(s))  Body fluid culture     Status: None (Preliminary result)   Collection Time: 09/19/14  4:19 PM  Result Value Ref Range Status   Specimen Description PLEURAL RIGHT  Final   Special Requests Normal  Final   Gram Stain  Final    CYTOSPIN SLIDE WBC PRESENT,BOTH PMN AND MONONUCLEAR NO ORGANISMS SEEN    Culture   Final    NO GROWTH < 12 HOURS Performed at [facility]    Report Status PENDING  Incomplete     Studies: Dg Chest 1 View  09/19/2014   CLINICAL DATA:  Status post right thoracentesis  EXAM: CHEST  1 VIEW  COMPARISON:  09/19/2014  FINDINGS: Negative for pneumothorax following right thoracentesis. Trace right effusion remains. Improved right middle and lower lobe aeration with some residual atelectasis. Large mediastinal mass along the right peritracheal region compatible with a known massive thyroid goiter. Left lung remains clear. Stable cardiomegaly and vascularity. No superimposed edema.  IMPRESSION: Negative for pneumothorax following right thoracentesis. Decrease in the right effusion. Otherwise stable exam.   Electronically Signed   By: Jerilynn Mages.  Shick M.D.   On: 09/19/2014 16:44   Dg Chest 2 View  09/19/2014   CLINICAL DATA:  Increasing shortness of breath. Wheezing. Onset of symptoms last night.  EXAM: CHEST  2 VIEW  COMPARISON:  CT 09/18/2014.  FINDINGS: RIGHT paratracheal mass compatible with substernal goiter. Moderate RIGHT pleural effusion with compressive atelectasis. These findings appear similar to the CT yesterday. Aortic arch atherosclerosis. Axillary surgical clips bilaterally. Monitoring leads project over the  chest. Cardiopericardial silhouette appears unchanged compared to prior chest radiographs. Lung volumes are lower than on previous study 08/24/2014.  IMPRESSION: 1. Moderate RIGHT pleural effusion with compressive/relaxation atelectasis of the RIGHT lower lobe and RIGHT middle lobe. 2. Large RIGHT peritracheal mass compatible with substernal goiter evaluated on prior CT. 3. Lower lung volumes than on prior chest radiographs.   Electronically Signed   By: Dereck Ligas M.D.   On: 09/19/2014 08:11   Ct Chest Wo Contrast  09/19/2014   CLINICAL DATA:  Right breast cancer. Increasing shortness of breath.  EXAM: CT CHEST WITHOUT CONTRAST  TECHNIQUE: Multidetector CT imaging of the chest was performed following the standard protocol without IV contrast.  COMPARISON:  Multiple exams, including 09/18/2014 and 09/19/2014  FINDINGS: Mediastinum/Nodes: Prominent thyroid goiter with massively enlarged right lobe extending into the right hemithorax and measuring approximately 13.9 by 8.0 by 7.1 cm.  Diffuse subcutaneous edema. Aortic arch and branch vessel atherosclerosis. Low-level stranding in the mediastinum with small to moderate pericardial effusion.  Lungs/Pleura: Right pleural effusion occupies greater than 50% of right hemithoracic volume and is associated with passive atelectasis.  Centrilobular emphysema. Old granulomatous disease. Trace left pleural effusion.  Upper abdomen: Stranding in the intra-abdominal adipose tissue.  Musculoskeletal: Thoracic spondylosis.  IMPRESSION: 1. Considerable third spacing of fluid with subcutaneous, mediastinal, and upper abdominal infiltrative edema. 2. Large right and trace left pleural effusions. 3. Very large thyroid goiter, with masslike extension of the right lobe into the right hemithorax as detailed on prior exams. 4. Centrilobular emphysema.   Electronically Signed   By: Van Clines M.D.   On: 09/19/2014 09:57   Dg Chest Port 1 View  09/20/2014   CLINICAL DATA:   81 year old female status post right thoracentesis  EXAM: PORTABLE CHEST - 1 VIEW  COMPARISON:  Prior chest x-ray 09/19/2014  FINDINGS: Stable cardiomegaly. Atherosclerotic calcification again noted in the transverse aorta. Similar appearance of large right paratracheal mass. There may be slight interval reaccumulation of the right-sided pleural effusion. New patchy opacity in the right lung base. Inspiratory volumes are slightly lower. The left lung remains relatively clear. Surgical changes suggest prior bilateral mastectomies. No acute osseous abnormality.  IMPRESSION:  1. Interval development of patchy airspace opacity in the right lung base which may represent atelectasis, infiltrate or aspiration in the appropriate clinical setting. 2. Perhaps trace interval reaccumulation of right pleural fluid. 3. Stable right paratracheal mass.   Electronically Signed   By: Jacqulynn Cadet M.D.   On: 09/20/2014 14:45   US Thoracentesis Asp Pleural Space W/img Guide  09/19/2014   INDICATION: Breast cancer, dyspnea, recurrent right pleural effusion. Request is made for diagnostic and therapeutic right thoracentesis.  EXAM: ULTRASOUND GUIDED DIAGNOSTIC AND THERAPEUTIC RIGHT THORACENTESIS  COMPARISON:  Prior thoracentesis on 08/24/2014  MEDICATIONS: None  COMPLICATIONS: None immediate  TECHNIQUE: Informed written consent was obtained from the patient after a discussion of the risks, benefits and alternatives to treatment. A timeout was performed prior to the initiation of the procedure.  Initial ultrasound scanning demonstrates a moderate to large right pleural effusion. The lower chest was prepped and draped in the usual sterile fashion. 1% lidocaine was used for local anesthesia.  An ultrasound image was saved for documentation purposes. A 6 Fr Safe-T-Centesis catheter was introduced. The thoracentesis was performed. The catheter was removed and a dressing was applied. The patient tolerated the procedure well without  immediate post procedural complication. The patient was escorted to have an upright chest radiograph.  FINDINGS: A total of approximately 1.2 liters of hazy, amber fluid was removed. Requested samples were sent to the laboratory.  IMPRESSION: Successful ultrasound-guided diagnostic and therapeutic right sided thoracentesis yielding 1.2 liters of pleural fluid.  Read by: Rowe Robert, PA-C   Electronically Signed   By: Markus Daft M.D.   On: 09/19/2014 16:31    Scheduled Meds: . albuterol  5 mg Nebulization Once  . ampicillin-sulbactam (UNASYN) IV  1.5 g Intravenous Q12H  . anastrozole  1 mg Oral Q breakfast  . atorvastatin  10 mg Oral q1800  . ferrous sulfate  325 mg Oral TID WC  . hydrocortisone sod succinate (SOLU-CORTEF) inj  100 mg Intravenous Once  . [START ON 09/21/2014] hydrocortisone sod succinate (SOLU-CORTEF) inj  50 mg Intravenous Q6H  . insulin aspart  0-15 Units Subcutaneous TID WC  . insulin aspart  0-5 Units Subcutaneous QHS  . ipratropium-albuterol  3 mL Nebulization BID  . linagliptin  5 mg Oral Daily  . sodium chloride  1,000 mL Intravenous Once   Continuous Infusions: . sodium chloride 150 mL/hr at 09/20/14 1712    Principal Problem:   Pleural effusion, right Active Problems:   HTN (hypertension)   Primary cancer of lower-inner quadrant of right female breast   Dyslipidemia   Type 2 diabetes mellitus with renal manifestations   Dyspnea    Time spent: 30 min    Laquandra Carrillo, [facility] Pager [phone]. If 7PM-7AM, please contact night-coverage at [email or web address], password [facility] 09/20/2014, 5:26 PM

## 2014-09-20 NOTE — Evaluation (Addendum)
Clinical/Bedside Swallow Evaluation Patient Details  Name: Alison Gross MRN: [id number] Date of Birth: 1932/07/05  Today's Date: 09/20/2014 Time: SLP Start Time (ACUTE ONLY): 1500 SLP Stop Time (ACUTE ONLY): 1540 SLP Time Calculation (min) (ACUTE ONLY): 40 min  Past Medical History:  Past Medical History  Diagnosis Date  . Hypercholesterolemia     takes Crestor daily  . Thyroid disease     had iodine radiation  . Hypertension     takes Hyzaar daily  . Dysrhythmia     takes Carvedilol daily  . Pneumonia     history of;last time about 4-72yr ago  . GERD (gastroesophageal reflux disease)     takes Protonix daily  . Gastric ulcer   . History of blood transfusion   . History of colon polyps   . Anemia     takes iron pill daily  . Diabetes mellitus without complication     takes Tradjenta daily  . History of radiation therapy 07/12/12-08/26/12    right breast/  . Paroxysmal atrial fibrillation     PCP EKG 09/27/2013: A. Fibrillation.. EKG 09/29/2013: S. Tach.  . Breast cancer 04/12/12    right-pos lymph node/left-DCIS  . Shortness of breath on exertion   . Bruit of left carotid artery   . Acute upper GI bleeding 01/24/2012  . MGUS (monoclonal gammopathy of unknown significance) 04/21/2013  . Osteoporosis 11/29/2013  . Recurrent right pleural effusion 07/26/2014  . Stage III chronic kidney disease 07/26/2014  . Goiter 07/27/2014   Past Surgical History:  Past Surgical History  Procedure Laterality Date  . Carpal tunnel release      left  . Breast biopsy    . Esophagogastroduodenoscopy  01/24/2012    Procedure: ESOPHAGOGASTRODUODENOSCOPY (EGD);  Surgeon: MJeryl Columbia MD;  [facility];  Service: Endoscopy;  Laterality: N/A;  . Thyroid goiter removed    . Cyst removed from back of neck    . Knee surgery      right with rods  . Esophagogastroduodenoscopy    . Colonoscopy    . Total mastectomy Bilateral 05/10/2012    Procedure: RIGHT modified mastectomy; LEFT total  mastectomy;  Surgeon: CHaywood Lasso MD;  [facility]  Service: General;  Laterality: Bilateral;  . Axillary sentinel node biopsy Right 05/10/2012    Procedure: AXILLARY SENTINEL lymph  NODE BIOPSY;  Surgeon: CHaywood Lasso MD;  [facility]  Service: General;  Laterality: Right;  nuclear medicine injection right side  7:00 am   . Abdominal hysterectomy      fibroids, with bilateral SO   HPI:  81yo female with hx of breast cancer, right nodule goiter, HLD, afib on chronic anticoagulation, HTN, DM2 on tradjenta and hx of recurrent R sided pleural effusions who presented to the ED with subjectively worsening sob over the past several days per MD note. Pt is s/p thoracentesis 6/16 yielding 800cc of fluid and thoracentesis during this admission.  Chest imaging demonstrated moderate R sided effusion. Pt remained without hypoxia or tachypnea. Swallow evaluation ordered.  Pt reports dysphagia with food/drink over the last few months.  Has some chronic dysphagia due to goiter and voice changes after goiter surgery approximately 40 years ago.  Pt admits to weight loss but denies dysphagia being source. Pt reports frequently choking on bulky items as well as with liquids.   Pt has never had swallow evaluation completed.  CXR showed development of right lobe infiltrate, ? aspiraton.     Assessment /  Plan / Recommendation Clinical Impression  Pt presents with symptoms of severe pharyngeal and  *? cervical esophageal mechanical dysphagia.   Suspect dysphagia is due to her large right multinodular goiter.  She has symptoms of pharyngeal stasis c/b by multiple swallows and aspiration c/b cough/throat clearing immediately post swallow.  Head turn left did not improve symptoms nor sensation of clearance.  Follow solids with liquids faciliates sensation of clearance.  Note plan for pt to have CT Scan of neck when blood pressure better controlled per RN.     Recommend consider MBS next date if CT scan  does not indicate surgical intervention warranted.  If medical intervention is warranted, pt may benefit from deferring instrumental swallow evaluation.   Pt admits to puree being easier to consume than solids but admits to displeasure with consistency.     Educated pt/family to findings, recommendations.  SLP also questions if pt has esophageal component to her dysphagia as frequent belching noted.  Pt agreeable to plan.     Aspiration Risk    Severe    Diet Recommendation Stage 1 baby food;Thin   Medication Administration:  (as tolerated) Compensations: Slow rate;Small sips/bites;Follow solids with liquid;Multiple dry swallows after each bite/sip;Other (Comment);Externally pace (cough and expectorate)    Other  Recommendations Oral Care Recommendations: Oral care BID   Follow Up Recommendations       Frequency and Duration min 2x/week  1 week   Pertinent Vitals/Pain Afebrile, decreased      Swallow Study Prior Functional Status   see hhx    General Date of Onset: 09/20/14 Other Pertinent Information: 81 yo female with hx of breast cancer, left nodule goiter, HLD, afib on chronic anticoagulation, HTN, DM2 on tradjenta and hx of recurrent R sided pleural effusions who presented to the ED with subjectively worsening sob over the past several days per MD note. Pt is s/p thoracentesis 6/16 yielding 800cc of fluid and thoracentesis during this admission.  Chest imaging demonstrated moderate R sided effusion. Pt remained without hypoxia or tachypnea. Swallow evaluation ordered.  Pt reports dysphagia with food/drink over the last few months.  Has some chronic dysphagia due to goiter and voice changes after goiter surgery approximately 40 years ago.  Pt admits to weight loss but denies dysphagia being source. Pt reports frequently choking on bulky items as well as with liquids.   Pt has never had swallow evaluation completed.  CXR showed development of right lobe infiltrate, ? aspiraton.    Type of Study: Bedside swallow evaluation Diet Prior to this Study: Dysphagia 1 (puree);Thin liquids Temperature Spikes Noted: No Respiratory Status: Room air History of Recent Intubation: No Behavior/Cognition: Alert;Cooperative;Pleasant mood Oral Cavity - Dentition: Adequate natural dentition/normal for age Self-Feeding Abilities: Able to feed self Patient Positioning: Upright in bed Baseline Vocal Quality: Low vocal intensity;Hoarse Volitional Cough: Weak Volitional Swallow: Able to elicit    Oral/Motor/Sensory Function Overall Oral Motor/Sensory Function: Appears within functional limits for tasks assessed   Ice Chips Ice chips: Within functional limits Presentation: Spoon   Thin Liquid Thin Liquid: Impaired Presentation: Cup Pharyngeal  Phase Impairments: Suspected delayed Swallow;Multiple swallows;Throat Clearing - Immediate;Cough - Immediate    Nectar Thick Nectar Thick Liquid: Not tested   Honey Thick Honey Thick Liquid: Not tested   Puree Puree: Impaired Presentation: Spoon;Self Fed Oral Phase Impairments: Reduced lingual movement/coordination;Impaired anterior to posterior transit Oral Phase Functional Implications: Prolonged oral transit Pharyngeal Phase Impairments: Multiple swallows;Throat Clearing - Immediate Other Comments: pt sensing residuals in pharynx, liquid  swallows faciliate sensation of clearance    Solid   GO Functional Assessment Tool Used: clinical judgement Functional Limitations: Swallowing Swallow Current Status (Q5927): At least 80 percent but less than 100 percent impaired, limited or restricted Swallow Goal Status [phone]): At least 60 percent but less than 80 percent impaired, limited or restricted  Solid: Not tested       Luanna Salk, [place] [facility] SLP [phone]

## 2014-09-20 NOTE — Telephone Encounter (Signed)
TC from family member stating pt has been admitted to [facility] in room # [phone] with shortness of breath. She has had a thoracentesis. Family member wanted Dr. Jana Hakim to know. Family would like Dr. Jana Hakim to stop and see pt today if possible.

## 2014-09-20 NOTE — Procedures (Signed)
Central Venous Catheter Insertion Procedure Note ERICHA WHITTINGHAM [id number] June 25, 1932  Procedure: Insertion of Central Venous Catheter Indications: Assessment of intravascular volume, Drug and/or fluid administration and Shock  Procedure Details Consent: Risks of procedure as well as the alternatives and risks of each were explained to the (patient/caregiver).  Consent for procedure obtained. Time Out: Verified patient identification, verified procedure, site/side was marked, verified correct patient position, special equipment/implants available, medications/allergies/relevent history reviewed, required imaging and test results available.  Performed  Maximum sterile technique was used including antiseptics, cap, gloves, gown, hand hygiene, mask and sheet. Skin prep: Chlorhexidine; local anesthetic administered A antimicrobial bonded/coated triple lumen catheter was placed in the right internal jugular vein using the Seldinger technique.  Evaluation Blood flow good Complications: No apparent complications Patient did tolerate procedure well. Chest X-ray ordered to verify placement.  CXR: normal.  Leonora Gores S. 09/20/2014, 11:56 PM

## 2014-09-20 NOTE — Progress Notes (Signed)
ANTIBIOTIC CONSULT NOTE - INITIAL  Pharmacy Consult for Unasyn Indication: endocarditis and Aspiration PNA  No Known Allergies  Patient Measurements: Height: '5\' 5"'$  (165.1 cm) Weight: 113 lb 1.6 oz (51.302 kg) IBW/kg (Calculated) : 57   Vital Signs: Temp: 98.9 F (37.2 C) (07/13 1413) Temp Source: Oral (07/13 1413) BP: 86/50 mmHg (07/13 1548) Pulse Rate: 103 (07/13 1548) Intake/Output from previous day: 07/12 0701 - 07/13 0700 In: 120 [P.O.:120] Out: 1 [Urine:1] Intake/Output from this shift: Total I/O In: 360 [P.O.:360] Out: 2 [Urine:2]  Labs:  Recent Labs  09/19/14 0750 09/20/14 0355 09/20/14 1510  WBC 4.3 4.9 4.6  HGB 9.1* 8.5* 8.7*  PLT 328 296 310  CREATININE 1.52* 1.34* 1.77*   Estimated Creatinine Clearance: 20.9 mL/min (by C-G formula based on Cr of 1.77). No results for input(s): VANCOTROUGH, VANCOPEAK, VANCORANDOM, GENTTROUGH, GENTPEAK, GENTRANDOM, TOBRATROUGH, TOBRAPEAK, TOBRARND, AMIKACINPEAK, AMIKACINTROU, AMIKACIN in the last 72 hours.   Microbiology: Recent Results (from the past 720 hour(s))  Culture, blood (routine x 2) Call MD if unable to obtain prior to antibiotics being given     Status: None   Collection Time: 08/22/14  3:00 PM  Result Value Ref Range Status   Specimen Description BLOOD LEFT HAND  Final   Special Requests BOTTLES DRAWN AEROBIC ONLY 10CC  Final   Culture   Final    NO GROWTH 5 DAYS Performed at [facility]    Report Status 08/28/2014 FINAL  Final  Culture, blood (routine x 2) Call MD if unable to obtain prior to antibiotics being given     Status: None   Collection Time: 08/22/14  3:20 PM  Result Value Ref Range Status   Specimen Description BLOOD LEFT ARM  Final   Special Requests BOTTLES DRAWN AEROBIC AND ANAEROBIC 10CC  Final   Culture   Final    NO GROWTH 5 DAYS Performed at [facility]    Report Status 08/28/2014 FINAL  Final  Culture, body fluid-bottle     Status: None   Collection Time:  08/24/14 10:46 AM  Result Value Ref Range Status   Specimen Description PLEURAL  Final   Special Requests NONE  Final   Culture   Final    NO GROWTH 5 DAYS Performed at [facility]    Report Status 08/29/2014 FINAL  Final  Gram stain     Status: None   Collection Time: 08/24/14 10:46 AM  Result Value Ref Range Status   Specimen Description PLEURAL  Final   Special Requests NONE  Final   Gram Stain   Final    CYTOSPIN SMEAR WBC PRESENT, PREDOMINANTLY MONONUCLEAR NO ORGANISMS SEEN Performed at [facility]    Report Status 08/24/2014 FINAL  Final  Body fluid culture     Status: None (Preliminary result)   Collection Time: 09/19/14  4:19 PM  Result Value Ref Range Status   Specimen Description PLEURAL RIGHT  Final   Special Requests Normal  Final   Gram Stain   Final    CYTOSPIN SLIDE WBC PRESENT,BOTH PMN AND MONONUCLEAR NO ORGANISMS SEEN    Culture   Final    NO GROWTH < 12 HOURS Performed at [facility]    Report Status PENDING  Incomplete    Medical History: Past Medical History  Diagnosis Date  . Hypercholesterolemia     takes Crestor daily  . Thyroid disease     had iodine radiation  . Hypertension  takes Hyzaar daily  . Dysrhythmia     takes Carvedilol daily  . Pneumonia     history of;last time about 4-53yr ago  . GERD (gastroesophageal reflux disease)     takes Protonix daily  . Gastric ulcer   . History of blood transfusion   . History of colon polyps   . Anemia     takes iron pill daily  . Diabetes mellitus without complication     takes Tradjenta daily  . History of radiation therapy 07/12/12-08/26/12    right breast/  . Paroxysmal atrial fibrillation     PCP EKG 09/27/2013: A. Fibrillation.. EKG 09/29/2013: S. Tach.  . Breast cancer 04/12/12    right-pos lymph node/left-DCIS  . Shortness of breath on exertion   . Bruit of left carotid artery   . Acute upper GI bleeding 01/24/2012  . MGUS (monoclonal gammopathy of  unknown significance) 04/21/2013  . Osteoporosis 11/29/2013  . Recurrent right pleural effusion 07/26/2014  . Stage III chronic kidney disease 07/26/2014  . Goiter 07/27/2014    Medications:  Scheduled:  . albuterol  5 mg Nebulization Once  . anastrozole  1 mg Oral Q breakfast  . atorvastatin  10 mg Oral q1800  . ferrous sulfate  325 mg Oral TID WC  . insulin aspart  0-15 Units Subcutaneous TID WC  . insulin aspart  0-5 Units Subcutaneous QHS  . linagliptin  5 mg Oral Daily  . sodium chloride  1,000 mL Intravenous Once  . sodium chloride  1,000 mL Intravenous Once   Infusions:  . sodium chloride 150 mL/hr at 09/20/14 1549   PRN: acetaminophen **OR** acetaminophen, morphine injection, ondansetron **OR** ondansetron (ZOFRAN) IV, Racepinephrine HCl Assessment: 81yoF on chronic anticoagulation for AFib, HTN, goiter, DM2 and h/o recurrent R sided pleural effusions who presented to the ER with SOB. Pt is s/p thoracentesis on 6/16. Unasyn to be started for aspiration pneumonia and IR to be consulted for consideration of repeat thoracentesis for recurrent R pleural effusion  Goal of Therapy:  Eradication of infection  Plan:  Will start Unasyn 1.5 Gm IV Q12h for Clcr=21  Will f/u Scr and adjust dose as needed. Will f/u Blood Cx  FGarnet Sierras PharmD 09/20/2014,4:14 PM

## 2014-09-20 NOTE — Progress Notes (Signed)
Report given to Maudie Mercury in icu, patient transferred.Awake,alert, oriented x4. Denies any discomforts.

## 2014-09-21 ENCOUNTER — Ambulatory Visit: Payer: Commercial Managed Care - HMO | Admitting: Oncology

## 2014-09-21 ENCOUNTER — Observation Stay (HOSPITAL_COMMUNITY): Payer: Commercial Managed Care - HMO

## 2014-09-21 ENCOUNTER — Ambulatory Visit (HOSPITAL_COMMUNITY): Payer: Commercial Managed Care - HMO

## 2014-09-21 DIAGNOSIS — Z8711 Personal history of peptic ulcer disease: Secondary | ICD-10-CM | POA: Diagnosis not present

## 2014-09-21 DIAGNOSIS — E2740 Unspecified adrenocortical insufficiency: Secondary | ICD-10-CM

## 2014-09-21 DIAGNOSIS — D509 Iron deficiency anemia, unspecified: Secondary | ICD-10-CM | POA: Diagnosis present

## 2014-09-21 DIAGNOSIS — K219 Gastro-esophageal reflux disease without esophagitis: Secondary | ICD-10-CM | POA: Diagnosis present

## 2014-09-21 DIAGNOSIS — I959 Hypotension, unspecified: Secondary | ICD-10-CM | POA: Diagnosis not present

## 2014-09-21 DIAGNOSIS — Z923 Personal history of irradiation: Secondary | ICD-10-CM | POA: Diagnosis not present

## 2014-09-21 DIAGNOSIS — Z9889 Other specified postprocedural states: Secondary | ICD-10-CM | POA: Diagnosis not present

## 2014-09-21 DIAGNOSIS — R34 Anuria and oliguria: Secondary | ICD-10-CM | POA: Diagnosis not present

## 2014-09-21 DIAGNOSIS — Z6820 Body mass index (BMI) 20.0-20.9, adult: Secondary | ICD-10-CM | POA: Diagnosis not present

## 2014-09-21 DIAGNOSIS — R490 Dysphonia: Secondary | ICD-10-CM | POA: Diagnosis not present

## 2014-09-21 DIAGNOSIS — I472 Ventricular tachycardia: Secondary | ICD-10-CM | POA: Diagnosis not present

## 2014-09-21 DIAGNOSIS — I480 Paroxysmal atrial fibrillation: Secondary | ICD-10-CM | POA: Diagnosis not present

## 2014-09-21 DIAGNOSIS — I82621 Acute embolism and thrombosis of deep veins of right upper extremity: Secondary | ICD-10-CM | POA: Diagnosis not present

## 2014-09-21 DIAGNOSIS — I5033 Acute on chronic diastolic (congestive) heart failure: Secondary | ICD-10-CM | POA: Diagnosis not present

## 2014-09-21 DIAGNOSIS — Z9013 Acquired absence of bilateral breasts and nipples: Secondary | ICD-10-CM | POA: Diagnosis present

## 2014-09-21 DIAGNOSIS — E43 Unspecified severe protein-calorie malnutrition: Secondary | ICD-10-CM | POA: Diagnosis not present

## 2014-09-21 DIAGNOSIS — J90 Pleural effusion, not elsewhere classified: Secondary | ICD-10-CM | POA: Diagnosis present

## 2014-09-21 DIAGNOSIS — E785 Hyperlipidemia, unspecified: Secondary | ICD-10-CM | POA: Diagnosis present

## 2014-09-21 DIAGNOSIS — E780 Pure hypercholesterolemia: Secondary | ICD-10-CM | POA: Diagnosis present

## 2014-09-21 DIAGNOSIS — I422 Other hypertrophic cardiomyopathy: Secondary | ICD-10-CM | POA: Diagnosis present

## 2014-09-21 DIAGNOSIS — N184 Chronic kidney disease, stage 4 (severe): Secondary | ICD-10-CM | POA: Diagnosis present

## 2014-09-21 DIAGNOSIS — I313 Pericardial effusion (noninflammatory): Secondary | ICD-10-CM | POA: Diagnosis not present

## 2014-09-21 DIAGNOSIS — E041 Nontoxic single thyroid nodule: Secondary | ICD-10-CM | POA: Diagnosis not present

## 2014-09-21 DIAGNOSIS — Z79899 Other long term (current) drug therapy: Secondary | ICD-10-CM | POA: Diagnosis not present

## 2014-09-21 DIAGNOSIS — D472 Monoclonal gammopathy: Secondary | ICD-10-CM | POA: Diagnosis present

## 2014-09-21 DIAGNOSIS — I129 Hypertensive chronic kidney disease with stage 1 through stage 4 chronic kidney disease, or unspecified chronic kidney disease: Secondary | ICD-10-CM | POA: Diagnosis present

## 2014-09-21 DIAGNOSIS — R1310 Dysphagia, unspecified: Secondary | ICD-10-CM | POA: Diagnosis not present

## 2014-09-21 DIAGNOSIS — Z833 Family history of diabetes mellitus: Secondary | ICD-10-CM | POA: Diagnosis not present

## 2014-09-21 DIAGNOSIS — E049 Nontoxic goiter, unspecified: Secondary | ICD-10-CM | POA: Diagnosis not present

## 2014-09-21 DIAGNOSIS — J948 Other specified pleural conditions: Secondary | ICD-10-CM | POA: Diagnosis not present

## 2014-09-21 DIAGNOSIS — K5900 Constipation, unspecified: Secondary | ICD-10-CM | POA: Diagnosis not present

## 2014-09-21 DIAGNOSIS — R609 Edema, unspecified: Secondary | ICD-10-CM | POA: Diagnosis not present

## 2014-09-21 DIAGNOSIS — D638 Anemia in other chronic diseases classified elsewhere: Secondary | ICD-10-CM | POA: Diagnosis present

## 2014-09-21 DIAGNOSIS — E860 Dehydration: Secondary | ICD-10-CM | POA: Diagnosis present

## 2014-09-21 DIAGNOSIS — T82868A Thrombosis of vascular prosthetic devices, implants and grafts, initial encounter: Secondary | ICD-10-CM | POA: Diagnosis not present

## 2014-09-21 DIAGNOSIS — Q238 Other congenital malformations of aortic and mitral valves: Secondary | ICD-10-CM | POA: Diagnosis not present

## 2014-09-21 DIAGNOSIS — Z87891 Personal history of nicotine dependence: Secondary | ICD-10-CM | POA: Diagnosis not present

## 2014-09-21 DIAGNOSIS — M810 Age-related osteoporosis without current pathological fracture: Secondary | ICD-10-CM | POA: Diagnosis present

## 2014-09-21 DIAGNOSIS — E1122 Type 2 diabetes mellitus with diabetic chronic kidney disease: Secondary | ICD-10-CM | POA: Diagnosis present

## 2014-09-21 DIAGNOSIS — Z7901 Long term (current) use of anticoagulants: Secondary | ICD-10-CM | POA: Diagnosis not present

## 2014-09-21 DIAGNOSIS — J439 Emphysema, unspecified: Secondary | ICD-10-CM | POA: Diagnosis not present

## 2014-09-21 DIAGNOSIS — E010 Iodine-deficiency related diffuse (endemic) goiter: Secondary | ICD-10-CM | POA: Diagnosis not present

## 2014-09-21 DIAGNOSIS — R222 Localized swelling, mass and lump, trunk: Secondary | ICD-10-CM | POA: Diagnosis not present

## 2014-09-21 DIAGNOSIS — Z86010 Personal history of colonic polyps: Secondary | ICD-10-CM | POA: Diagnosis not present

## 2014-09-21 DIAGNOSIS — Z853 Personal history of malignant neoplasm of breast: Secondary | ICD-10-CM | POA: Diagnosis not present

## 2014-09-21 DIAGNOSIS — J3801 Paralysis of vocal cords and larynx, unilateral: Secondary | ICD-10-CM | POA: Diagnosis present

## 2014-09-21 DIAGNOSIS — Z90710 Acquired absence of both cervix and uterus: Secondary | ICD-10-CM | POA: Diagnosis not present

## 2014-09-21 DIAGNOSIS — Y828 Other medical devices associated with adverse incidents: Secondary | ICD-10-CM | POA: Diagnosis present

## 2014-09-21 LAB — CBC
HEMATOCRIT: 25.8 % — AB (ref 36.0–46.0)
Hemoglobin: 8.2 g/dL — ABNORMAL LOW (ref 12.0–15.0)
MCH: 28.2 pg (ref 26.0–34.0)
MCHC: 31.8 g/dL (ref 30.0–36.0)
MCV: 88.7 fL (ref 78.0–100.0)
Platelets: 258 10*3/uL (ref 150–400)
RBC: 2.91 MIL/uL — ABNORMAL LOW (ref 3.87–5.11)
RDW: 16.1 % — AB (ref 11.5–15.5)
WBC: 3.5 10*3/uL — ABNORMAL LOW (ref 4.0–10.5)

## 2014-09-21 LAB — GLUCOSE, CAPILLARY
GLUCOSE-CAPILLARY: 96 mg/dL (ref 65–99)
GLUCOSE-CAPILLARY: 162 mg/dL — AB (ref 65–99)
GLUCOSE-CAPILLARY: 136 mg/dL — AB (ref 65–99)
GLUCOSE-CAPILLARY: 150 mg/dL — AB (ref 65–99)
Glucose-Capillary: 156 mg/dL — ABNORMAL HIGH (ref 65–99)
Glucose-Capillary: 132 mg/dL — ABNORMAL HIGH (ref 65–99)

## 2014-09-21 LAB — BASIC METABOLIC PANEL
Anion gap: 3 — ABNORMAL LOW (ref 5–15)
BUN: 25 mg/dL — ABNORMAL HIGH (ref 6–20)
CALCIUM: 9.5 mg/dL (ref 8.9–10.3)
CO2: 25 mmol/L (ref 22–32)
Chloride: 112 mmol/L — ABNORMAL HIGH (ref 101–111)
Creatinine, Ser: 1.83 mg/dL — ABNORMAL HIGH (ref 0.44–1.00)
GFR, EST AFRICAN AMERICAN: 29 mL/min — AB (ref >60)
GFR, EST NON AFRICAN AMERICAN: 25 mL/min — AB (ref >60)
GLUCOSE: 160 mg/dL — AB (ref 65–99)
Potassium: 4.5 mmol/L (ref 3.5–5.1)
Sodium: 140 mmol/L (ref 135–145)

## 2014-09-21 LAB — HEPARIN LEVEL (UNFRACTIONATED)
Heparin Unfractionated: 0.62 IU/mL (ref 0.30–0.70)
Heparin Unfractionated: 0.49 IU/mL (ref 0.30–0.70)

## 2014-09-21 LAB — APTT
APTT: 77 s — AB (ref 24–37)
aPTT: 34 seconds (ref 24–37)

## 2014-09-21 LAB — TROPONIN I
TROPONIN I: 0.29 ng/mL — AB (ref <0.031)
TROPONIN I: 0.3 ng/mL — AB (ref <0.031)

## 2014-09-21 LAB — SODIUM, URINE, RANDOM: SODIUM UR: 13 mmol/L

## 2014-09-21 LAB — CREATININE, URINE, RANDOM: Creatinine, Urine: 329.48 mg/dL

## 2014-09-21 MED ORDER — SODIUM CHLORIDE 0.9 % IV BOLUS (SEPSIS)
1000.0000 mL | Freq: Once | INTRAVENOUS | Status: AC
  Administered 2014-09-21: 1000 mL via INTRAVENOUS

## 2014-09-21 MED ORDER — POLYETHYLENE GLYCOL 3350 17 G PO PACK
17.0000 g | PACK | Freq: Every day | ORAL | Status: DC
  Administered 2014-09-22 – 2014-09-28 (×5): 17 g via ORAL
  Filled 2014-09-21 (×13): qty 1

## 2014-09-21 MED ORDER — SODIUM CHLORIDE 0.9 % IV BOLUS (SEPSIS)
500.0000 mL | Freq: Once | INTRAVENOUS | Status: AC
  Administered 2014-09-21: 500 mL via INTRAVENOUS

## 2014-09-21 MED ORDER — PANTOPRAZOLE SODIUM 40 MG PO TBEC
40.0000 mg | DELAYED_RELEASE_TABLET | Freq: Every day | ORAL | Status: DC
  Administered 2014-09-21 – 2014-10-03 (×12): 40 mg via ORAL
  Filled 2014-09-21 (×11): qty 1

## 2014-09-21 MED ORDER — HEPARIN (PORCINE) IN NACL 100-0.45 UNIT/ML-% IJ SOLN
950.0000 [IU]/h | INTRAMUSCULAR | Status: DC
  Administered 2014-09-21: 950 [IU]/h via INTRAVENOUS
  Filled 2014-09-21 (×3): qty 250

## 2014-09-21 MED ORDER — SODIUM CHLORIDE 0.9 % IV SOLN: INTRAVENOUS | Status: DC

## 2014-09-21 NOTE — Progress Notes (Signed)
Speech Language Pathology Treatment: Dysphagia  Patient Details Name: Alison Gross MRN: [id number] DOB: 08/07/1932 Today's Date: 09/21/2014 Time: 0910-0940 SLP Time Calculation (min) (ACUTE ONLY): 30 min  Assessment / Plan / Recommendation Clinical Impression  Observed pt consuming breakfast of scrambled eggs, grits, applesauce and coffee as well as medicine provided by RN with water. Also provided pt with graham crackers which she adequately masticated and reports ease of clearance equivalent to pureed.  Tolerance of all po intake improved today compared to yesterday as pt denies sensation of residuals nor aspiration sensation.    Note CT neck ordered to assess substernal goiter - results pending.  Again suspect multilevel obstructive dysphagia (possible pharyngeal and cervical esophagus) due to goiter likely leading to ongoing aspiration.  Pt also with possible sensation compromise due to length of deficits possibly leading to desensitization.    Recommend consider dietary advancement to dys3/thin given pt awareness of her dysphagia and use of compensation strategies.  SLP to follow for assistance with dysphagia management.  Using teach back, reviewed strategies and recommendations with pt.    HPI Other Pertinent Information: 81 yo female with hx of breast cancer, right nodule goiter, HLD, afib on chronic anticoagulation, HTN, DM2 on tradjenta and hx of recurrent R sided pleural effusions who presented to the ED with subjectively worsening sob over the past several days per MD note. Pt is s/p thoracentesis 6/16 yielding 800cc of fluid and thoracentesis during this admission.  Chest imaging demonstrated moderate R sided effusion. Pt remained without hypoxia or tachypnea. Swallow evaluation ordered.  Pt reports dysphagia with food/drink over the last few months.  Has some chronic dysphagia due to goiter and voice changes after goiter surgery approximately 40 years ago.  Pt admits to weight loss but  denies dysphagia being source. Pt reports frequently choking on bulky items as well as with liquids.   Pt has never had swallow evaluation completed.  CXR showed development of right lobe infiltrate, ? aspiraton.     Pertinent Vitals Pain Assessment: No/denies pain  SLP Plan  Continue with current plan of care    Recommendations Diet recommendations: Dysphagia 3 (mechanical soft);Thin liquid Liquids provided via: Cup;Straw Medication Administration:  (as tolerated) Supervision: Patient able to self feed Compensations: Slow rate;Small sips/bites;Follow solids with liquid;Multiple dry swallows after each bite/sip;Other (Comment);Externally pace (rest break if short of breath or coughing excessively) Postural Changes and/or Swallow Maneuvers: Seated upright 90 degrees;Upright 30-60 min after meal              Oral Care Recommendations: Oral care BID Follow up Recommendations: Other (comment) (tbd) Plan: Continue with current plan of care    [place], [place], [place] [facility] SLP [phone]

## 2014-09-21 NOTE — Progress Notes (Signed)
Pts BP has been very labile during the evening.  Maps in the 40-50s and heart rate in afib with rates 110s up to 130-140s.  Pt has been off her xeralto since Monday night.  NP and MD paged regarding limited IV access tachycardia and hypotentsion.  Dr. Lamonte Sakai in to see patient, place central line and begin pressors.  Before 2am patient converted back to NSR, she is resting quietly, BP is up in the 90's on 3 mcg of Levophed.  Currently CVP was 11, UOP only 10cc hour max. Elink aware with no orders rec'd.  Will follow labs in am.

## 2014-09-21 NOTE — Progress Notes (Signed)
Pharmacy: [facility]  Patient's a 81 y.o F on heparin for hx afib and r/o PE.  APTT and heparin level now back therapeutic at 77 secs and 0.49, respectively.  No bleeding documented.  Goal of Therapy:  Heparin level 0.3-0.7 units/ml aPTT 66-102 seconds Monitor platelets by anticoagulation protocol: Yes    Plan: - continue current rate of 950 units/hr - will f/u with AM aPTT and heparin level at 0500.  If both levels remain in therapeutic range and correlate with each other, will change to monitor with heparin level only  Dia Sitter, PharmD, BCPS 09/21/2014 7:39 PM

## 2014-09-21 NOTE — Progress Notes (Signed)
[place] Progress Note Patient Name: Alison Gross DOB: 05/29/1932 MRN: [id number]   Date of Service  09/21/2014  HPI/Events of Note  FeNA from this AM = 0.04% c/w prerenal. CVP = 13. Urine output last 2 hours = 30 mL.   eICU Interventions  Will send BMP . urine Na+ and urine Creatinine for another FeNA calculation.      Intervention Category Intermediate Interventions: Oliguria - evaluation and management  Sommer,Steven Eugene 09/21/2014, 10:46 PM

## 2014-09-21 NOTE — Progress Notes (Signed)
[place] Progress Note Patient Name: Alison Gross DOB: Nov 01, 1932 MRN: [id number]   Date of Service  09/21/2014  HPI/Events of Note  Bedside nurse requests Miralax order.  eICU Interventions  Miralax ordered.     Intervention Category Minor Interventions: Routine modifications to care plan (e.g. PRN medications for pain, fever)  Lakeisha Waldrop Eugene 09/21/2014, 7:47 PM

## 2014-09-21 NOTE — Progress Notes (Signed)
  Echocardiogram 2D Echocardiogram has been performed.  Alison Gross 09/21/2014, 12:29 PM

## 2014-09-21 NOTE — Progress Notes (Signed)
PULMONARY / CRITICAL CARE MEDICINE   Name: Alison Gross MRN: [id number] DOB: [birth year]    ADMISSION DATE:  09/19/2014 CONSULTATION DATE:  09/20/14  REFERRING MD :  Jerilynn Mages. Short  REASON FOR CONSULTATION:  Shock   INITIAL PRESENTATION:  81 yo female presented with dyspnea and Rt pleural effusion.  Developed hypotension with concern for sepsis.  Transferred to ICU and PCCM assumed consulted.  She has hx of Breast cancer 2002 and 2014 s/p b/l mastectomy, Large Rt goiter, diastolic CHF, A fib, HTN, DM,  MGUS, Stage III CKD, and recurrent Rt pleural effusion.  STUDIES:  6/16 Rt pleural fluid >> 800 ml, protein 3.2, LDH 49, WBC 614 (84%L), atypical cells present on cytology 7/11 PET scan >> moderate/large Rt pleural effusion, massive thyroid goiter 7/12 CT chest >> third spacing of fluid, Large Rt pleural effusion, very large thyroid, centrilobular emphysema 7/12 Rt pleural fluid >> 1200 ml, glucose 99, protein < 3, LDH 66, WBC 1008 (53%L) 7/14 CT head/neck >> massive Rt thyroid, mod/large Rt effusion  SIGNIFICANT EVENTS: 7/12 Admit 7/13 Speech assessment >> stage 1 baby food; to ICU, pressors 7/14 off pressors  SUBJECTIVE:  Feels better.  Denies chest pain.  Breathing better.  Still has hoarseness.  VITAL SIGNS: Temp:  [97.2 F (36.2 C)-99.3 F (37.4 C)] 97.2 F (36.2 C) (07/14 0715) Pulse Rate:  [36-132] 79 (07/14 0715) Resp:  [12-27] 14 (07/14 0715) BP: (64-156)/(34-66) 93/44 mmHg (07/14 0715) SpO2:  [91 %-100 %] 100 % (07/14 0715) HEMODYNAMICS: CVP:  [11 mmHg] 11 mmHg INTAKE / OUTPUT:  Intake/Output Summary (Last 24 hours) at 09/21/14 0812 Last data filed at 09/21/14 0700  Gross per 24 hour  Intake 6554.14 ml  Output     52 ml  Net 6502.14 ml    PHYSICAL EXAMINATION: General:  Thin woman, weak but in no distress, hoarse voice Neuro:  Awake, non-focal HEENT:  OP clear, significant UA rhonchi Cardiovascular:  Tachy, IR, no M Lungs:  Clear on L, decreased on R,  referred UA noise Abdomen:  Soft, benign Musculoskeletal:  No lesions, no edema Skin:  No rash  LABS:  CBC  Recent Labs Lab 09/20/14 0355 09/20/14 1510 09/21/14 0530  WBC 4.9 4.6 3.5*  HGB 8.5* 8.7* 8.2*  HCT 26.3* 28.0* 25.8*  PLT 296 310 258   Coag's  Recent Labs Lab 09/20/14 0355 09/20/14 2007  APTT  --  37  INR 1.62*  --    BMET  Recent Labs Lab 09/20/14 0355 09/20/14 1510 09/21/14 0530  NA 145 142 140  K 3.9 4.0 4.5  CL 110 108 112*  CO2 '29 29 25  '$ BUN 26* 24* 25*  CREATININE 1.34* 1.77* 1.83*  GLUCOSE 89 117* 160*   Electrolytes  Recent Labs Lab 09/20/14 0355 09/20/14 1510 09/21/14 0530  CALCIUM 11.0* 10.6* 9.5   Sepsis Markers  Recent Labs Lab 09/20/14 1510 09/20/14 1745  LATICACIDVEN 1.6 1.6   Liver Enzymes  Recent Labs Lab 09/19/14 0750 09/20/14 0355 09/20/14 1510  AST '26 21 23  '$ ALT '18 16 14  '$ ALKPHOS 70 63 63  BILITOT 0.8 0.8 0.7  ALBUMIN 3.4* 2.9* 3.0*   Cardiac Enzymes  Recent Labs Lab 09/20/14 1745 09/21/14 0030 09/21/14 0530  TROPONINI 0.33* 0.30* 0.29*   Glucose  Recent Labs Lab 09/19/14 1308 09/19/14 1725 09/19/14 2045 09/20/14 0846 09/20/14 1205 09/21/14 0739  GLUCAP 71 83 96 116* 95 162*    Imaging Dg Chest Port 1 View  09/21/2014  CLINICAL DATA:  Dyspnea. Pleural effusions. Large substernal goiter.  EXAM: PORTABLE CHEST - 1 VIEW  COMPARISON:  09/20/2014  FINDINGS: Central venous catheter is unchanged with the tip in the superior vena cava above the cavoatrial junction. Again noted is the large right-sided substernal goiter extending into the right hemi thorax. There is new  There is new slight interstitial pulmonary edema. Bilateral pleural effusions have slightly increased. Cardiomegaly is unchanged. Pulmonary vascularity appears slightly less prominent.  IMPRESSION: New interstitial edema.  Increasing effusions.   Electronically Signed   By: Lorriane Shire M.D.   On: 09/21/2014 07:03   Dg Chest Port  1 View  09/21/2014   CLINICAL DATA:  Central line placement  EXAM: PORTABLE CHEST - 1 VIEW  COMPARISON:  09/20/2014  FINDINGS: Interval placement of a right central venous catheter. Tip is projected over the low SVC region. The course of the catheter is somewhat lateral than would be expected but this is likely due to vascular displacement by a large right peritracheal mass lesion. Small amount of subcutaneous emphysema in the right neck. Persistent cardiac enlargement with mild pulmonary vascular congestion. Bilateral pleural effusions with basilar atelectasis or infiltration bilaterally. Surgical clips in the axilla bilaterally. Calcification of the aorta. No pneumothorax.  IMPRESSION: Right central venous catheter appears to be in adequate position. Persistent large right peritracheal mass lesion. Cardiac enlargement with pulmonary vascular congestion and bilateral pleural effusions with basilar infiltration or atelectasis.   Electronically Signed   By: Lucienne Capers M.D.   On: 09/21/2014 00:30   Dg Chest Port 1 View  09/20/2014   CLINICAL DATA:  82 year old female status post right thoracentesis  EXAM: PORTABLE CHEST - 1 VIEW  COMPARISON:  Prior chest x-ray 09/19/2014  FINDINGS: Stable cardiomegaly. Atherosclerotic calcification again noted in the transverse aorta. Similar appearance of large right paratracheal mass. There may be slight interval reaccumulation of the right-sided pleural effusion. New patchy opacity in the right lung base. Inspiratory volumes are slightly lower. The left lung remains relatively clear. Surgical changes suggest prior bilateral mastectomies. No acute osseous abnormality.  IMPRESSION: 1. Interval development of patchy airspace opacity in the right lung base which may represent atelectasis, infiltrate or aspiration in the appropriate clinical setting. 2. Perhaps trace interval reaccumulation of right pleural fluid. 3. Stable right paratracheal mass.   Electronically Signed    By: Jacqulynn Cadet M.D.   On: 09/20/2014 14:45    Cultures: Rt pleural fluid 7/12 >> Blood 7/13 >>  Lines: Rt IJ CVL 7/13 >>  ASSESSMENT / PLAN:  Hypotension 2nd to adrenal insufficiency >> cortisol 9.9 from 7/13. Plan: - continue solu cortef - even fluid balance  Recurrent Rt exudate pleural effusion. Plan: - f/u pleural fluid cytology from 7/12 - f/u CXR - if fluid re-accumulates, then might need thoracic surgery evaluation  ?of aspiration pneumonia >> seems less likely. Plan: - d/c unasyn 7/14  Mild centrilobular emphysema noted on CT chest. Remote hx of smoking. Plan: - will need PFT's when more stable - prn BD's  Hoarseness. Dysphagia. Hx of GERD, gastric ulcer Plan: - D3 diet - protonix  Massive Rt thyroid enlargement >> does not appear to be causing airway compression on CT imaging. Plan: - per primary team  Hx of A fib, chronic diastolic CHF, HTN. Plan: - per primary team  Stage 3 CKD. Plan: - monitor renal fx, urine outpt  Anemia of chronic disease. Plan: - f/u CBC  Updated pt's daughter at bedside.  Summary: She is  off pressors >> suspect hypotension was related to hypovolemia and adrenal insufficiency.  Doubt infectious process.  Will d/c Abx.  PCCM will continue to follow for pleural effusion.  Chesley Mires, MD [facility] Pulmonary/Critical Care 09/21/2014, 11:51 AM Pager:  [phone] After 3pm call: [phone]

## 2014-09-21 NOTE — Progress Notes (Signed)
[name] Progress Note Patient Name: [name] Gross DOB: January 29, 1933 MRN: [id number]   Date of Service  09/21/2014  HPI/Events of Note  Oliguria - CVP = 8. Patient is on 2 liters [place] with sat = 100% and RR = 19.  eICU Interventions  Will bolus with 0.9 NaCl 1 liter IV over 1 hour now.     Intervention Category Intermediate Interventions: Oliguria - evaluation and management  Sommer,Steven Eugene 09/21/2014, 4:14 PM

## 2014-09-21 NOTE — Progress Notes (Signed)
[facility] Progress Note Patient Name: Alison Gross DOB: [birth year] MRN: [id number]   Date of Service  09/21/2014  HPI/Events of Note  Remains oliguric. Urine output 120 mL for the last 12 hours. CVP = 11.  eICU Interventions  Will order: 1. Bladder scan to r/o residual urine. 2. Bolus with 0.9 NaCl 500 mL IV over 30 minutes now.      Intervention Category Intermediate Interventions: Oliguria - evaluation and management  Elliett Guarisco Eugene 09/21/2014, 7:04 PM

## 2014-09-21 NOTE — Progress Notes (Signed)
COURTESY NOTE: appreciate your care to this patient. -- I have alerted her radiation oncologist, Dr Pablo Ledger, as to the admission so she may advise regarding the possibility of XRT to shrink the large thyroid mass.  Will follow with you.

## 2014-09-21 NOTE — Progress Notes (Signed)
ANTICOAGULATION CONSULT NOTE   Pharmacy Consult for heparin Indication: hx Afib, r/o VTE  No Known Allergies  Patient Measurements: Height: '5\' 5"'$  (165.1 cm) Weight: 113 lb 1.6 oz (51.302 kg) IBW/kg (Calculated) : 57  Heparin weight: 52 kg  Vital Signs: Temp: 97.2 F (36.2 C) (07/14 0715) Temp Source: Core (Comment) (07/14 0400) BP: 93/44 mmHg (07/14 0715) Pulse Rate: 79 (07/14 0715)  Labs:  Recent Labs  09/20/14 0355  09/20/14 1510 09/20/14 1745 09/20/14 2007 09/21/14 0030 09/21/14 0530 09/21/14 0813  HGB 8.5*  --  8.7*  --   --   --  8.2*  --   HCT 26.3*  --  28.0*  --   --   --  25.8*  --   PLT 296  --  310  --   --   --  258  --   APTT  --   --   --   --  37  --   --  34  LABPROT 19.3*  --   --   --   --   --   --   --   INR 1.62*  --   --   --   --   --   --   --   HEPARINUNFRC  --   --   --   --  0.97*  --   --  0.62  CREATININE 1.34*  --  1.77*  --   --   --  1.83*  --   TROPONINI  --   < > 0.33* 0.33*  --  0.30* 0.29*  --   < > = values in this interval not displayed.  Estimated Creatinine Clearance: 20.2 mL/min (by C-G formula based on Cr of 1.83).   Medical History: Past Medical History  Diagnosis Date  . Hypercholesterolemia     takes Crestor daily  . Thyroid disease     had iodine radiation  . Hypertension     takes Hyzaar daily  . Dysrhythmia     takes Carvedilol daily  . Pneumonia     history of;last time about 4-18yr ago  . GERD (gastroesophageal reflux disease)     takes Protonix daily  . Gastric ulcer   . History of blood transfusion   . History of colon polyps   . Anemia     takes iron pill daily  . Diabetes mellitus without complication     takes Tradjenta daily  . History of radiation therapy 07/12/12-08/26/12    right breast/  . Paroxysmal atrial fibrillation     PCP EKG 09/27/2013: A. Fibrillation.. EKG 09/29/2013: S. Tach.  . Breast cancer 04/12/12    right-pos lymph node/left-DCIS  . Shortness of breath on exertion   . Bruit  of left carotid artery   . Acute upper GI bleeding 01/24/2012  . MGUS (monoclonal gammopathy of unknown significance) 04/21/2013  . Osteoporosis 11/29/2013  . Recurrent right pleural effusion 07/26/2014  . Stage III chronic kidney disease 07/26/2014  . Goiter 07/27/2014    Assessment: Patient's a 81y.o F on xarelto PTA for hx Afib.  Xarelto has been placed on hold since admission (last dose taken on 09/18/14 at 2100) d/t thoracentesis for R pleural effusion-- s/p thoracentesis on 09/19/14. Patient continues to have low O2 sats s/p procedure. To resume anticoagulation today per Dr. SSheran Favafor hx Afib and to r/o PE.  With scr trending up to 1.77 today (est. Crcl~21), patient is not a candidate for  VTE xarelto treatment therapy-- will start heparin therapy until renal function improves.  Today, 09/21/2014:   Heparin level = 0.62 (therapeutic), aPTT = 34 sec (SUBtherapeutic) on heparin 800 units/hr  Baseline heparin level (prior to start of heparin gtt) was elevated thus consistent with lingering effects of xarelto despite last dose being 7/11 but AKI slowing elimination.   CBC: Hgb 8.2 (trending down), pltc WNL  No obvious or overt bleeding  Goal of Therapy:  Heparin level 0.3-0.7 units/ml aPTT 66-102 seconds Monitor platelets by anticoagulation protocol: Yes   Plan:  - Based on low aPTT, will increase heparin 950 units/hr  - check 8 hour heparin level and aPTT - follow both until better correlation between the two - monitor s/s bleeding - Monitor CBC, Hgb trend  Doreene Eland, PharmD, BCPS.   Pager: [phone]  09/21/2014,9:30 AM

## 2014-09-22 ENCOUNTER — Inpatient Hospital Stay (HOSPITAL_COMMUNITY): Payer: Commercial Managed Care - HMO

## 2014-09-22 DIAGNOSIS — J90 Pleural effusion, not elsewhere classified: Secondary | ICD-10-CM | POA: Diagnosis present

## 2014-09-22 DIAGNOSIS — I951 Orthostatic hypotension: Secondary | ICD-10-CM

## 2014-09-22 LAB — BASIC METABOLIC PANEL
Anion gap: 4 — ABNORMAL LOW (ref 5–15)
Anion gap: 4 — ABNORMAL LOW (ref 5–15)
BUN: 33 mg/dL — ABNORMAL HIGH (ref 6–20)
BUN: 33 mg/dL — ABNORMAL HIGH (ref 6–20)
CHLORIDE: 113 mmol/L — AB (ref 101–111)
CHLORIDE: 115 mmol/L — AB (ref 101–111)
CO2: 21 mmol/L — ABNORMAL LOW (ref 22–32)
CO2: 22 mmol/L (ref 22–32)
CREATININE: 1.88 mg/dL — AB (ref 0.44–1.00)
Calcium: 8.5 mg/dL — ABNORMAL LOW (ref 8.9–10.3)
Calcium: 9 mg/dL (ref 8.9–10.3)
Creatinine, Ser: 2.05 mg/dL — ABNORMAL HIGH (ref 0.44–1.00)
GFR calc Af Amer: 25 mL/min — ABNORMAL LOW (ref >60)
GFR calc Af Amer: 28 mL/min — ABNORMAL LOW (ref >60)
GFR calc non Af Amer: 22 mL/min — ABNORMAL LOW (ref >60)
GFR, EST NON AFRICAN AMERICAN: 24 mL/min — AB (ref >60)
Glucose, Bld: 144 mg/dL — ABNORMAL HIGH (ref 65–99)
Glucose, Bld: 141 mg/dL — ABNORMAL HIGH (ref 65–99)
POTASSIUM: 4.2 mmol/L (ref 3.5–5.1)
Potassium: 4.2 mmol/L (ref 3.5–5.1)
SODIUM: 141 mmol/L (ref 135–145)
Sodium: 138 mmol/L (ref 135–145)

## 2014-09-22 LAB — GLUCOSE, CAPILLARY
GLUCOSE-CAPILLARY: 143 mg/dL — AB (ref 65–99)
GLUCOSE-CAPILLARY: 103 mg/dL — AB (ref 65–99)
Glucose-Capillary: 119 mg/dL — ABNORMAL HIGH (ref 65–99)
Glucose-Capillary: 122 mg/dL — ABNORMAL HIGH (ref 65–99)

## 2014-09-22 LAB — CBC
HEMATOCRIT: 25 % — AB (ref 36.0–46.0)
Hemoglobin: 7.9 g/dL — ABNORMAL LOW (ref 12.0–15.0)
MCH: 28.5 pg (ref 26.0–34.0)
MCHC: 31.6 g/dL (ref 30.0–36.0)
MCV: 90.3 fL (ref 78.0–100.0)
Platelets: 272 10*3/uL (ref 150–400)
RBC: 2.77 MIL/uL — ABNORMAL LOW (ref 3.87–5.11)
RDW: 16.2 % — AB (ref 11.5–15.5)
WBC: 8.2 10*3/uL (ref 4.0–10.5)

## 2014-09-22 LAB — SODIUM, URINE, RANDOM: Sodium, Ur: <10 mmol/L

## 2014-09-22 LAB — T3, FREE: T3, Free: 2.2 pg/mL (ref 2.0–4.4)

## 2014-09-22 LAB — APTT: APTT: 91 s — AB (ref 24–37)

## 2014-09-22 LAB — BODY FLUID CULTURE
Culture: NO GROWTH
SPECIAL REQUESTS: NORMAL

## 2014-09-22 LAB — HEPARIN LEVEL (UNFRACTIONATED): Heparin Unfractionated: 0.46 IU/mL (ref 0.30–0.70)

## 2014-09-22 LAB — CREATININE, URINE, RANDOM: Creatinine, Urine: 219.46 mg/dL

## 2014-09-22 MED ORDER — HYDROCORTISONE NA SUCCINATE PF 100 MG IJ SOLR
50.0000 mg | Freq: Two times a day (BID) | INTRAMUSCULAR | Status: AC
  Administered 2014-09-23 – 2014-09-24 (×2): 50 mg via INTRAVENOUS
  Filled 2014-09-22 (×2): qty 1

## 2014-09-22 MED ORDER — HEPARIN (PORCINE) IN NACL 100-0.45 UNIT/ML-% IJ SOLN
950.0000 [IU]/h | INTRAMUSCULAR | Status: DC
  Administered 2014-09-23: 950 [IU]/h via INTRAVENOUS
  Filled 2014-09-22 (×2): qty 250

## 2014-09-22 MED ORDER — BOOST / RESOURCE BREEZE PO LIQD
1.0000 | Freq: Three times a day (TID) | ORAL | Status: DC
  Administered 2014-09-23 – 2014-09-24 (×3): 1 via ORAL
  Administered 2014-09-24: 12:00:00 via ORAL
  Administered 2014-09-25: 1 via ORAL
  Administered 2014-09-25: 0.9875 via ORAL
  Administered 2014-09-28 (×2): 1 via ORAL

## 2014-09-22 NOTE — Progress Notes (Signed)
ANTICOAGULATION CONSULT NOTE   Pharmacy Consult for heparin Indication: hx Afib, r/o VTE  No Known Allergies  Patient Measurements: Height: '5\' 5"'$  (165.1 cm) Weight: 113 lb 1.6 oz (51.302 kg) IBW/kg (Calculated) : 57  Heparin weight: 52 kg  Vital Signs: Temp: 97.7 F (36.5 C) (07/15 0600) Temp Source: Oral (07/14 2000) BP: 107/50 mmHg (07/15 0600) Pulse Rate: 88 (07/15 0600)  Labs:  Recent Labs  09/20/14 0355  09/20/14 1510 09/20/14 1745  09/21/14 0030 09/21/14 0530 09/21/14 0813 09/21/14 1901 09/21/14 2320 09/22/14 0500  HGB 8.5*  --  8.7*  --   --   --  8.2*  --   --   --  7.9*  HCT 26.3*  --  28.0*  --   --   --  25.8*  --   --   --  25.0*  PLT 296  --  310  --   --   --  258  --   --   --  272  APTT  --   --   --   --   < >  --   --  34 77*  --  91*  LABPROT 19.3*  --   --   --   --   --   --   --   --   --   --   INR 1.62*  --   --   --   --   --   --   --   --   --   --   HEPARINUNFRC  --   --   --   --   < >  --   --  0.62 0.49  --  0.46  CREATININE 1.34*  --  1.77*  --   --   --  1.83*  --   --  2.05*  --   TROPONINI  --   < > 0.33* 0.33*  --  0.30* 0.29*  --   --   --   --   < > = values in this interval not displayed.  Estimated Creatinine Clearance: 18 mL/min (by C-G formula based on Cr of 2.05).   Medical History: Past Medical History  Diagnosis Date  . Hypercholesterolemia     takes Crestor daily  . Thyroid disease     had iodine radiation  . Hypertension     takes Hyzaar daily  . Dysrhythmia     takes Carvedilol daily  . Pneumonia     history of;last time about 4-64yr ago  . GERD (gastroesophageal reflux disease)     takes Protonix daily  . Gastric ulcer   . History of blood transfusion   . History of colon polyps   . Anemia     takes iron pill daily  . Diabetes mellitus without complication     takes Tradjenta daily  . History of radiation therapy 07/12/12-08/26/12    right breast/  . Paroxysmal atrial fibrillation     PCP EKG  09/27/2013: A. Fibrillation.. EKG 09/29/2013: S. Tach.  . Breast cancer 04/12/12    right-pos lymph node/left-DCIS  . Shortness of breath on exertion   . Bruit of left carotid artery   . Acute upper GI bleeding 01/24/2012  . MGUS (monoclonal gammopathy of unknown significance) 04/21/2013  . Osteoporosis 11/29/2013  . Recurrent right pleural effusion 07/26/2014  . Stage III chronic kidney disease 07/26/2014  . Goiter 07/27/2014    Assessment: Patient's a 81  y.o F on xarelto PTA for hx Afib.  Xarelto has been placed on hold since admission (last dose taken on 09/18/14 at 2100) d/t thoracentesis for R pleural effusion-- s/p thoracentesis on 09/19/14. Patient continues to have low O2 sats s/p procedure. To resume anticoagulation today per Dr. Sheran Fava for hx Afib and to r/o PE.  With scr trending up to 1.77 today (est. Crcl~21), patient is not a candidate for VTE xarelto treatment therapy-- will start heparin therapy until renal function improves.  Today, 09/22/2014:   Heparin level = 0.46 (therapeutic), aPTT = 91 sec (therapeutic) on heparin 950 units/hr  CBC: Hgb 7.9 (trending down), pltc WNL  No obvious or overt bleeding  Goal of Therapy:  Heparin level 0.3-0.7 units/ml aPTT 66-102 seconds Monitor platelets by anticoagulation protocol: Yes   Plan:  -Continue heparin at 950 units/hr  - heparin level and aPTT correlate, moving forward will monitor heparin level only - monitor s/s bleeding - Monitor CBC, Hgb trend  Dolly Rias RPh 09/22/2014, 7:26 AM Pager [phone]

## 2014-09-22 NOTE — Progress Notes (Signed)
PULMONARY / CRITICAL CARE MEDICINE   Name: Alison Gross MRN: [id number] DOB: 04/06/1932    ADMISSION DATE:  09/19/2014 CONSULTATION DATE:  09/20/14  REFERRING MD :  Jerilynn Mages. Short  REASON FOR CONSULTATION:  Shock   INITIAL PRESENTATION:  81 yo female presented with dyspnea and Rt pleural effusion.  Developed hypotension with concern for sepsis.  Transferred to ICU and PCCM assumed consulted.  She has hx of Breast cancer 2002 and 2014 s/p b/l mastectomy, Large Rt goiter, diastolic CHF, A fib, HTN, DM,  MGUS, Stage III CKD, and recurrent Rt pleural effusion.  STUDIES:  6/16 Rt pleural fluid >> 800 ml, protein 3.2, LDH 49, WBC 614 (84%L), atypical cells present on cytology 7/11 PET scan >> moderate/large Rt pleural effusion, massive thyroid goiter 7/12 CT chest >> third spacing of fluid, Large Rt pleural effusion, very large thyroid, centrilobular emphysema 7/12 Rt pleural fluid >> 1200 ml, glucose 99, protein < 3, LDH 66, WBC 1008 (53%L) 7/14 CT head/neck >> massive Rt thyroid, mod/large Rt effusion 7/14 Echo >> severe LVH, EF 55 to 21%, grade 2 diastolic dysfx, severe LA dilation, mod RA dilation, small/mod pericardial effusion  SIGNIFICANT EVENTS: 7/12 Admit 7/13 Speech assessment >> stage 1 baby food; to ICU, pressors 7/14 off pressors; speech f/u >> D3 diet  SUBJECTIVE:  She had difficulty tolerating diet.  Still feels hoarse.  VITAL SIGNS: Temp:  [97.2 F (36.2 C)-98.4 F (36.9 C)] 97.5 F (36.4 C) (07/15 0800) Pulse Rate:  [86-95] 93 (07/15 0800) Resp:  [11-23] 16 (07/15 0800) BP: (97-144)/(47-72) 125/72 mmHg (07/15 0800) SpO2:  [96 %-100 %] 100 % (07/15 0800) HEMODYNAMICS: CVP:  [8 mmHg-12 mmHg] 12 mmHg INTAKE / OUTPUT:  Intake/Output Summary (Last 24 hours) at 09/22/14 0932 Last data filed at 09/22/14 0800  Gross per 24 hour  Intake 4477.5 ml  Output    249 ml  Net 4228.5 ml    PHYSICAL EXAMINATION: General: pleasant Neuro:  Awake, normal strength HEENT:   Hoarse  Cardiovascular: irregular Lungs:  Decreased BS Rt base Abdomen:  Soft, non tender Musculoskeletal:  No lesions, Rt arm edema Skin:  No rashes  LABS:  CBC  Recent Labs Lab 09/20/14 1510 09/21/14 0530 09/22/14 0500  WBC 4.6 3.5* 8.2  HGB 8.7* 8.2* 7.9*  HCT 28.0* 25.8* 25.0*  PLT 310 258 272   Coag's  Recent Labs Lab 09/20/14 0355  09/21/14 0813 09/21/14 1901 09/22/14 0500  APTT  --   < > 34 77* 91*  INR 1.62*  --   --   --   --   < > = values in this interval not displayed.   BMET  Recent Labs Lab 09/21/14 0530 09/21/14 2320 09/22/14 0500  NA 140 138 141  K 4.5 4.2 4.2  CL 112* 113* 115*  CO2 25 21* 22  BUN 25* 33* 33*  CREATININE 1.83* 2.05* 1.88*  GLUCOSE 160* 144* 141*   Electrolytes  Recent Labs Lab 09/21/14 0530 09/21/14 2320 09/22/14 0500  CALCIUM 9.5 8.5* 9.0   Sepsis Markers  Recent Labs Lab 09/20/14 1510 09/20/14 1745  LATICACIDVEN 1.6 1.6   Liver Enzymes  Recent Labs Lab 09/19/14 0750 09/20/14 0355 09/20/14 1510  AST '26 21 23  '$ ALT '18 16 14  '$ ALKPHOS 70 63 63  BILITOT 0.8 0.8 0.7  ALBUMIN 3.4* 2.9* 3.0*   Cardiac Enzymes  Recent Labs Lab 09/20/14 1745 09/21/14 0030 09/21/14 0530  TROPONINI 0.33* 0.30* 0.29*   Glucose  Recent  Labs Lab 09/20/14 1805 09/20/14 2137 09/21/14 0739 09/21/14 1139 09/21/14 1647 09/21/14 2157  GLUCAP 96 156* 162* 132* 136* 150*    Imaging No results found.  Cultures: Rt pleural fluid 7/12 >> Blood 7/13 >>  Lines: Rt IJ CVL 7/13 >>  ASSESSMENT / PLAN:  Hypotension 2nd to adrenal insufficiency >> cortisol 9.9 from 7/13. Plan: - transition off solu cortef per [facility] - even fluid balance  Recurrent Rt exudate pleural effusion. Plan: - f/u pleural fluid cytology from 7/12 - f/u CXR - if fluid re-accumulates, then might need thoracic surgery evaluation  ?of aspiration pneumonia >> seems less likely. Plan: - d/c'ed unasyn 7/14  Mild centrilobular emphysema  noted on CT chest. Remote hx of smoking. Plan: - will need PFT's when more stable >> can be done as outpt - prn BD's  Hoarseness. Dysphagia. Hx of GERD, gastric ulcer Plan: - D3 diet - f/u with speech therapy - protonix  Massive Rt thyroid enlargement. Plan: - would likely need thoracic surgery assessment if surgical intervention is to be considered  Hx of A fib, chronic diastolic CHF, HTN. Pericardial effusion. Plan: - per primary team  Stage 3 CKD. Oliguria. Plan: - per primary team  Anemia of chronic disease. Plan: - f/u CBC  Updated pt's daughter at bedside.  Summary: PCCM will f/u as consult for pleural effusion >> since this has recurred several times, would be best to have thoracic surgery assess >> might need pleurx.  Thoracic surgery could then also assess goiter and significance of pericardial effusion.  PCCM can be available as needed over weekend >> please call if help needed.  Otherwise, will f/u on Monday, July 18.  Chesley Mires, MD [facility] Pulmonary/Critical Care 09/22/2014, 9:32 AM Pager:  [phone] After 3pm call: [phone]

## 2014-09-22 NOTE — Progress Notes (Signed)
Triple lumen IJ removed per MD order. Pt tolerated well.

## 2014-09-22 NOTE — Progress Notes (Addendum)
[facility] PROGRESS NOTE  SHAHEEN STAR IHK:[id number] DOB: 1932/11/13 DOA: 09/19/2014 PCP: Maximino Greenland, MD  Brief Summary  Alison Gross is a 81 y.o. female with a hx of goiter, HLD, afib on chronic anticoagulation, HTN, DM2 on tradjenta and hx of recurrent R sided pleural effusions who presents to the ED with subjectively worsening sob over the past several days. Pt is s/p thoracentesis 6/16 yielding 800cc of borderline exudative fluid, thought more likely transudative at that time.  In ED, chest imaging demonstrated moderate R sided effusion.  Of note, patient does have a known goiter. Family reported pt had been having more difficulty swallowing foods recently.    STUDIES:  6/16 Rt pleural fluid >> 800 ml, protein 3.2, LDH 49, WBC 614 (84%L), atypical cells present on cytology but were finally reported as reactive mesothelial cells 7/11 PET scan >> moderate/large Rt pleural effusion, massive thyroid goiter 7/12 ADMIT 7/12 CT chest >> third spacing of fluid, Large Rt pleural effusion, very large thyroid, centrilobular emphysema 7/12 Rt pleural fluid >> 1200 ml, glucose 99, protein < 3, LDH 66, WBC 1008 (53%L) 7/13 developed hypotension and transferred to ICU requiring IVF and vasopressors, Speech assessment >> stage 1 baby food 7/14 weaned off vasopressors 7/14 CT head/neck >> massive Rt thyroid, mod/large Rt effusion 7/14 Echo >> severe LVH, EF 55 to 75%, grade 2 diastolic dysfx, severe LA dilation, mod RA dilation, small/mod pericardial effusion  Assessment/Plan  Hypotension, was likely secondary to intravascular volume depletion/dehydration and relative adrenal insufficiency.  She was given IVF and started on stress dose steroids. -  Decreased to BID hydrocortisone today -  Transition back to xarelto since low suspicion for PE   Recurrent right pleural effusion likely secondary to her goiter -   Status post thoracentesis by interventional radiology on 7/12 -    Previous cytology demonstrated only reactive mesothelial cells, no evidence of malignancy  -   Cytology from 7/12 also consistent with reactive mesothelial cells -   Pleural fluid Cx NG -  Discussed case with cardiothoracic surgery who stated they would follow-up with her in clinic on Tuesday. Given her frailty, she is likely not a candidate for any large invasive surgeries. Over the phone, they stated that they did not feel that a VATS procedure would be particularly helpful. They also were not concerned about her pericardial effusion and I am in agreement since she has no evidence of temp and on echocardiogram, nor clinically now that she is rehydrated. -  Most likely, she would benefit from a Pleurx catheter.  We will monitor her in the hospital while tapering her steroids, and consider placement during this hospitalization since I believe she will reaccumulate her effusion in the next few days to weeks. She understands that she could have loss of protein with further malnutrition.  Dysphonia, likely secondary to stretch of the laryngeal nerve by her goiter. The case was discussed with Dr. Wilburn Cornelia from ENT. She does have what appears to be right vocal cord paralysis and this may be permanent, and since she is not a good candidate for surgery little can be done to treat it. -  Speech therapy  -  follow-up with ENT in clinic    Elevated troponin, chest pain-free, EKG demonstrates no acute ST segment elevation or T-wave inversions. She did previously have some T-wave inversions in the lateral leads and some mild ST segment elevations in the anterior leads which are stable.  Stable elevation due to CKD and effusion.  Chronic diastolic heart failure, CVP 12  -  D/c IVF -  Consider resuming lasix in AM  Paroxysmal atrial fibrillation,  -  Resume anticoagulation with xarelto   Diabetes mellitus type 2 with well controlled blood sugars, A1c 6.2 last month -  Continue mod dose SSI  Right-sided  breast cancer in remission, no evidence of recurrence on recent PET scan -  Continue arimidex   Hyperlipidemia, stable, continue atorvastatin   Goiter, large and compressing the right lung and likely contributing to her swallowing and speech difficulties.  See above. -  TSH 1.115 on 6/15 and fT3 and fT4 wnl   Dysphasia and dysphonia, likely secondary to large goiter.  Got choked up on dysphagia 3 diet. -   Swallow evaluation:  Start dysphagia 2 again for now  -   CT of the neck:  Right vocal cord paralysis   CKD stage IV,  Creatinine clearance less than 30 , creatinine 1.7 -   Minimize nephrotoxicity and renally dose medications  Hypercalcemia secondary to malignancy and possible thyroid abnormalities (nonparathyroid hypercalcemia) -  Vitamin D 54, PTHrp wnl, PTH < 10 -  Avoid calcium and vitamin D supplementation -  Consider bisphosphonate -  No lasix while hypotensive  Iron deficiency anemia superimposed on MGUS. - continue iron supplementation - B12, folate, and TSH wnl  RUE swelling -  Check duplex RUE -  Remove CVC  Malnutrition -  Nutrition  -  supplements  Diet:  Dysphagia 1 Access:  PIV IVF:  off Proph:  Heparin gtt  Code Status: full  Family Communication: patient, her husband and multiple family members in room Disposition Plan:   Transfer to telemetry   Consultants:  PCCM  IR  Procedures:  Thoracentesis of right pleural effusion by IR on 09/19/2014  CT soft tissue neck pending    Antibiotics:  Unasyn 7/13 >   HPI/Subjective:  Feels better today.  Still hoarse.  Had some choking with her breakfast this morning.  Right arm is swollen and tense.  Legs are starting to become swollen also.    Objective: Filed Vitals:   09/22/14 1200 09/22/14 1300 09/22/14 1400 09/22/14 1500  BP: 140/66 131/63 113/53 112/62  Pulse: 98 95 89 86  Temp: 97.5 F (36.4 C) 98.1 F (36.7 C) 98.2 F (36.8 C) 98.1 F (36.7 C)  TempSrc:      Resp: '16 17 16 11   '$ Height:      Weight:      SpO2: 100% 100% 100% 100%    Intake/Output Summary (Last 24 hours) at 09/22/14 1554 Last data filed at 09/22/14 1500  Gross per 24 hour  Intake   4379 ml  Output    299 ml  Net   4080 ml   Filed Weights   09/19/14 1300 09/20/14 0551  Weight: 52.164 kg (115 lb) 51.302 kg (113 lb 1.6 oz)   Body mass index is 18.82 kg/(m^2).  Exam:   General:   Cachectic female,No acute distress, hoarse voice  HEENT:  NCAT, MMM  Cardiovascular:  IRRR, nl S1, S2 no mrg, 2+ pulses, warm extremities  Respiratory:   Diminished on the right side to the apex, no focal rales or rhonchi, no increased WOB  Abdomen:   NABS, soft, NT/ND  MSK:   Normal tone and bulk, 1+ pitting edema of bilateral LE.  Tense edema 2+ of RUE  Neuro:  Grossly intact  Data Reviewed: Basic Metabolic Panel:  Recent Labs Lab 09/20/14 0355 09/20/14 1510  09/21/14 0530 09/21/14 2320 09/22/14 0500  NA 145 142 140 138 141  K 3.9 4.0 4.5 4.2 4.2  CL 110 108 112* 113* 115*  CO2 '29 29 25 '$ 21* 22  GLUCOSE 89 117* 160* 144* 141*  BUN 26* 24* 25* 33* 33*  CREATININE 1.34* 1.77* 1.83* 2.05* 1.88*  CALCIUM 11.0* 10.6* 9.5 8.5* 9.0   Liver Function Tests:  Recent Labs Lab 09/18/14 0946 09/19/14 0750 09/20/14 0355 09/20/14 1510  AST '27 26 21 23  '$ ALT '18 18 16 14  '$ ALKPHOS 78 70 63 63  BILITOT 0.68 0.8 0.8 0.7  PROT 6.8 7.0 6.1* 6.5  ALBUMIN 3.3* 3.4* 2.9* 3.0*   No results for input(s): LIPASE, AMYLASE in the last 168 hours. No results for input(s): AMMONIA in the last 168 hours. CBC:  Recent Labs Lab 09/18/14 0946 09/19/14 0750 09/20/14 0355 09/20/14 1510 09/21/14 0530 09/22/14 0500  WBC 4.5 4.3 4.9 4.6 3.5* 8.2  NEUTROABS 2.9 2.4  --   --   --   --   HGB 9.6* 9.1* 8.5* 8.7* 8.2* 7.9*  HCT 29.4* 28.2* 26.3* 28.0* 25.8* 25.0*  MCV 87.9 88.4 88.9 89.2 88.7 90.3  PLT 319 328 296 310 258 272    Recent Results (from the past 240 hour(s))  Body fluid culture     Status: None    Collection Time: 09/19/14  4:19 PM  Result Value Ref Range Status   Specimen Description PLEURAL RIGHT  Final   Special Requests Normal  Final   Gram Stain   Final    CYTOSPIN SLIDE WBC PRESENT,BOTH PMN AND MONONUCLEAR NO ORGANISMS SEEN    Culture   Final    NO GROWTH 3 DAYS Performed at [facility]    Report Status 09/22/2014 FINAL  Final  Culture, blood (routine x 2)     Status: None (Preliminary result)   Collection Time: 09/20/14  5:45 PM  Result Value Ref Range Status   Specimen Description BLOOD LARM  Final   Special Requests BOTTLES DRAWN AEROBIC AND ANAEROBIC 5CC  Final   Culture   Final    NO GROWTH 2 DAYS Performed at [facility]    Report Status PENDING  Incomplete  Culture, blood (routine x 2)     Status: None (Preliminary result)   Collection Time: 09/20/14  5:54 PM  Result Value Ref Range Status   Specimen Description BLOOD LHAND  Final   Special Requests BOTTLES DRAWN AEROBIC ONLY 1.5CC  Final   Culture   Final    NO GROWTH 2 DAYS Performed at [facility]    Report Status PENDING  Incomplete  MRSA PCR Screening     Status: None   Collection Time: 09/20/14  6:45 PM  Result Value Ref Range Status   MRSA by PCR NEGATIVE NEGATIVE Final    Comment:        The GeneXpert MRSA Assay (FDA approved for NASAL specimens only), is one component of a comprehensive MRSA colonization surveillance program. It is not intended to diagnose MRSA infection nor to guide or monitor treatment for MRSA infections.      Studies: Ct Soft Tissue Neck Wo Contrast  09/21/2014   CLINICAL DATA:  81 year old female with difficulty swallowing. Initial encounter. Right pleural effusion treated with thoracentesis 2 days ago. History of thyroid goiter. History of treated breast cancer in 2014.  EXAM: CT NECK WITHOUT CONTRAST  TECHNIQUE: Multidetector CT imaging of the neck was performed following the  standard protocol without intravenous contrast.  COMPARISON:   PET-CT 09/18/2014, 10/28/2013.  Chest CT 10/28/2013.  FINDINGS: Chronic massive right thyroid lobe enlargement, with the vast majority of the enlarged right lobe occupying the mediastinum and right hemithorax.  Mediastinal involvement by right thyroid goiter seen as far back as 2004 (with about 7 cm of right thyroid lobe in the mediastinum at that time).  Currently the right lobe extends as far caudally as the right mainstem bronchus and encompasses approximately 8.0 by 9.8 x 15.0 cm (AP by transverse by CC), with an estimated volume of 588 mL. Compare that to a normal splenic volume of no larger than 412 mL.  Chronic heterogeneous density throughout the enlarged lobe. The left thyroid lobe is also chronically but more modestly enlarged encompassing about 4.3 x 3.1 x 6.4 cm (AP by transverse by CC). Despite the marked thyroid enlargement there is no significant mass effect on the trachea, carina, or right mainstem.  Asymmetry of the larynx suggests right vocal cord paralysis. No laryngeal or pharyngeal soft tissue mass is evident. Negative parapharyngeal, retropharyngeal, and sublingual spaces. Negative non contrast submandibular and parotid glands. No cervical lymphadenopathy identified in the absence of contrast. Negative visualized noncontrast brain parenchyma. Postoperative changes to the globes.  Chronic sphenoid sinus periosteal thickening, but Visualized paranasal sinuses and mastoids are clear. Absent dentition. No acute or suspicious osseous lesion identified. Small sclerotic focus in the right manubrium is stable since 2004.  Right IJ approach central line in place.  Layering moderate to large right pleural effusion and smaller left pleural effusion. Compressive atelectasis in both lungs greater on the right.  IMPRESSION: 1. Massive chronic enlargement of the right thyroid lobe, with the vast majority of the enlarged lobe occupying the mediastinum and right hemithorax as far down as the right mainstem  bronchus. See details above. 2. Despite this, no significant mass effect on the airway or trachea. 3. Layering moderate to large right and smaller left pleural effusions with right greater than left pulmonary compressive atelectasis. 4. Suggestion of right vocal cord paralysis. No laryngeal/pharyngeal mass or cervical lymphadenopathy identified in the absence of contrast.   Electronically Signed   By: Genevie Ann M.D.   On: 09/21/2014 10:06   Dg Chest Port 1 View  09/22/2014   CLINICAL DATA:  Right pleural effusion and shortness of breath. Status post right thoracentesis on 09/19/2014. History of thyroid goiter and breast carcinoma.  EXAM: PORTABLE CHEST - 1 VIEW  COMPARISON:  09/21/2014 and CT of the chest on 09/19/2014.  FINDINGS: Since thoracentesis, there is slight reaccumulation of right pleural fluid with a relatively small effusion identified by chest x-ray. There likely is a small left pleural effusion. Right lower lung atelectasis versus infiltrate present. Stable appearance of jugular central line and large right-sided mediastinal thyroid goiter.  IMPRESSION: Slight reaccumulation of right pleural fluid with relatively small effusion present. There likely is a small left pleural effusion. Right lower lung atelectasis versus infiltrate.   Electronically Signed   By: Aletta Edouard M.D.   On: 09/22/2014 10:41   Dg Chest Port 1 View  09/21/2014   CLINICAL DATA:  Dyspnea. Pleural effusions. Large substernal goiter.  EXAM: PORTABLE CHEST - 1 VIEW  COMPARISON:  09/20/2014  FINDINGS: Central venous catheter is unchanged with the tip in the superior vena cava above the cavoatrial junction. Again noted is the large right-sided substernal goiter extending into the right hemi thorax. There is new  There is new slight interstitial pulmonary edema.  Bilateral pleural effusions have slightly increased. Cardiomegaly is unchanged. Pulmonary vascularity appears slightly less prominent.  IMPRESSION: New interstitial  edema.  Increasing effusions.   Electronically Signed   By: Lorriane Shire M.D.   On: 09/21/2014 07:03   Dg Chest Port 1 View  09/21/2014   CLINICAL DATA:  Central line placement  EXAM: PORTABLE CHEST - 1 VIEW  COMPARISON:  09/20/2014  FINDINGS: Interval placement of a right central venous catheter. Tip is projected over the low SVC region. The course of the catheter is somewhat lateral than would be expected but this is likely due to vascular displacement by a large right peritracheal mass lesion. Small amount of subcutaneous emphysema in the right neck. Persistent cardiac enlargement with mild pulmonary vascular congestion. Bilateral pleural effusions with basilar atelectasis or infiltration bilaterally. Surgical clips in the axilla bilaterally. Calcification of the aorta. No pneumothorax.  IMPRESSION: Right central venous catheter appears to be in adequate position. Persistent large right peritracheal mass lesion. Cardiac enlargement with pulmonary vascular congestion and bilateral pleural effusions with basilar infiltration or atelectasis.   Electronically Signed   By: Lucienne Capers M.D.   On: 09/21/2014 00:30    Scheduled Meds: . albuterol  5 mg Nebulization Once  . anastrozole  1 mg Oral Q breakfast  . atorvastatin  10 mg Oral q1800  . ferrous sulfate  325 mg Oral TID WC  . [START ON 09/23/2014] hydrocortisone sod succinate (SOLU-CORTEF) inj  50 mg Intravenous Q12H  . insulin aspart  0-15 Units Subcutaneous TID WC  . insulin aspart  0-5 Units Subcutaneous QHS  . linagliptin  5 mg Oral Daily  . pantoprazole  40 mg Oral Daily  . polyethylene glycol  17 g Oral Daily  . sodium chloride  1,000 mL Intravenous Once   Continuous Infusions: . sodium chloride 100 mL/hr at 09/22/14 0920  . heparin 950 Units/hr (09/22/14 0800)    Principal Problem:   Pleural effusion, right Active Problems:   HTN (hypertension)   Dyslipidemia   Type 2 diabetes mellitus with renal manifestations   Dyspnea    Hypotension   Dysphagia   Dysphonia    Time spent: 60 minutes including time spent with family and consulting by phone with CT surgery, ENT, and PCCM.      Janece Canterbury  [facility] Pager [phone]. If 7PM-7AM, please contact night-coverage at [email or web address], password [facility] 09/22/2014, 3:54 PM  LOS: 1 day

## 2014-09-22 NOTE — Progress Notes (Signed)
Pt transferred to 4W via bed accompanied by RN and CNA. She was alert and oriented. Heparin gtt infusing and foley catheter intact. Family at bedside. Pt tolerated transfer well.

## 2014-09-23 ENCOUNTER — Encounter (HOSPITAL_COMMUNITY): Payer: Self-pay | Admitting: Thoracic Surgery (Cardiothoracic Vascular Surgery)

## 2014-09-23 ENCOUNTER — Inpatient Hospital Stay (HOSPITAL_COMMUNITY): Payer: Commercial Managed Care - HMO

## 2014-09-23 DIAGNOSIS — J3800 Paralysis of vocal cords and larynx, unspecified: Secondary | ICD-10-CM

## 2014-09-23 DIAGNOSIS — E010 Iodine-deficiency related diffuse (endemic) goiter: Secondary | ICD-10-CM

## 2014-09-23 DIAGNOSIS — R609 Edema, unspecified: Secondary | ICD-10-CM

## 2014-09-23 LAB — CBC
HEMATOCRIT: 26.9 % — AB (ref 36.0–46.0)
HEMOGLOBIN: 8.1 g/dL — AB (ref 12.0–15.0)
MCH: 28.2 pg (ref 26.0–34.0)
MCHC: 30.1 g/dL (ref 30.0–36.0)
MCV: 93.7 fL (ref 78.0–100.0)
Platelets: 248 10*3/uL (ref 150–400)
RBC: 2.87 MIL/uL — AB (ref 3.87–5.11)
RDW: 16.7 % — ABNORMAL HIGH (ref 11.5–15.5)
WBC: 10.3 10*3/uL (ref 4.0–10.5)

## 2014-09-23 LAB — BASIC METABOLIC PANEL
ANION GAP: 4 — AB (ref 5–15)
BUN: 37 mg/dL — ABNORMAL HIGH (ref 6–20)
CO2: 19 mmol/L — AB (ref 22–32)
Calcium: 8.5 mg/dL — ABNORMAL LOW (ref 8.9–10.3)
Chloride: 117 mmol/L — ABNORMAL HIGH (ref 101–111)
Creatinine, Ser: 1.9 mg/dL — ABNORMAL HIGH (ref 0.44–1.00)
GFR calc non Af Amer: 24 mL/min — ABNORMAL LOW (ref >60)
GFR, EST AFRICAN AMERICAN: 28 mL/min — AB (ref >60)
Glucose, Bld: 88 mg/dL (ref 65–99)
Potassium: 4.5 mmol/L (ref 3.5–5.1)
SODIUM: 140 mmol/L (ref 135–145)

## 2014-09-23 LAB — GLUCOSE, CAPILLARY
Glucose-Capillary: 74 mg/dL (ref 65–99)
Glucose-Capillary: 78 mg/dL (ref 65–99)
Glucose-Capillary: 78 mg/dL (ref 65–99)

## 2014-09-23 LAB — HEPARIN LEVEL (UNFRACTIONATED): Heparin Unfractionated: 0.48 IU/mL (ref 0.30–0.70)

## 2014-09-23 MED ORDER — LACTULOSE 10 GM/15ML PO SOLN
30.0000 g | Freq: Once | ORAL | Status: AC
  Administered 2014-09-23: 30 g via ORAL
  Filled 2014-09-23: qty 45

## 2014-09-23 MED ORDER — HEPARIN (PORCINE) IN NACL 100-0.45 UNIT/ML-% IJ SOLN
1050.0000 [IU]/h | INTRAMUSCULAR | Status: AC
  Administered 2014-09-24 – 2014-09-26 (×3): 950 [IU]/h via INTRAVENOUS
  Filled 2014-09-23 (×3): qty 250

## 2014-09-23 MED ORDER — BISACODYL 10 MG RE SUPP: 10.0000 mg | Freq: Every day | RECTAL | Status: DC | PRN

## 2014-09-23 MED ORDER — ENSURE ENLIVE PO LIQD
237.0000 mL | Freq: Two times a day (BID) | ORAL | Status: DC
  Administered 2014-09-28 – 2014-10-03 (×3): 237 mL via ORAL

## 2014-09-23 NOTE — Progress Notes (Signed)
Received pt on 2west.  Pt alert and oriented x4 with no c/o pain at this time.  Pt states she just has shob that is unchanged. Pt is 100% on 2L but pt states she feels better with oxygen on.

## 2014-09-23 NOTE — Progress Notes (Signed)
ANTICOAGULATION CONSULT NOTE -follow up  Pharmacy Consult for heparin Indication: hx Afib, r/o VTE  No Known Allergies  Patient Measurements: Height: '5\' 5"'$  (165.1 cm) Weight: 113 lb 1.6 oz (51.302 kg) IBW/kg (Calculated) : 57  Heparin weight: 52 kg  Vital Signs: Temp: 98.4 F (36.9 C) (07/15 2105) Temp Source: Oral (07/16 0511) BP: 110/62 mmHg (07/16 0511) Pulse Rate: 91 (07/16 0511)  Labs:  Recent Labs  09/20/14 1745  09/21/14 0030 09/21/14 0530 09/21/14 0813 09/21/14 1901 09/21/14 2320 09/22/14 0500 09/23/14 0515 09/23/14 0525 09/23/14 0630  HGB  --   --   --  8.2*  --   --   --  7.9*  --   --  8.1*  HCT  --   --   --  25.8*  --   --   --  25.0*  --   --  26.9*  PLT  --   --   --  258  --   --   --  272  --   --  248  APTT  --   < >  --   --  34 77*  --  91*  --   --   --   HEPARINUNFRC  --   < >  --   --  0.62 0.49  --  0.46  --  0.48  --   CREATININE  --   --   --  1.83*  --   --  2.05* 1.88* 1.90*  --   --   TROPONINI 0.33*  --  0.30* 0.29*  --   --   --   --   --   --   --   < > = values in this interval not displayed.  Estimated Creatinine Clearance: 19.4 mL/min (by C-G formula based on Cr of 1.9).   Medical History: Past Medical History  Diagnosis Date  . Hypercholesterolemia     takes Crestor daily  . Thyroid disease     had iodine radiation  . Hypertension     takes Hyzaar daily  . Dysrhythmia     takes Carvedilol daily  . Pneumonia     history of;last time about 4-19yr ago  . GERD (gastroesophageal reflux disease)     takes Protonix daily  . Gastric ulcer   . History of blood transfusion   . History of colon polyps   . Anemia     takes iron pill daily  . Diabetes mellitus without complication     takes Tradjenta daily  . History of radiation therapy 07/12/12-08/26/12    right breast/  . Paroxysmal atrial fibrillation     PCP EKG 09/27/2013: A. Fibrillation.. EKG 09/29/2013: S. Tach.  . Breast cancer 04/12/12    right-pos lymph  node/left-DCIS  . Shortness of breath on exertion   . Bruit of left carotid artery   . Acute upper GI bleeding 01/24/2012  . MGUS (monoclonal gammopathy of unknown significance) 04/21/2013  . Osteoporosis 11/29/2013  . Recurrent right pleural effusion 07/26/2014  . Stage III chronic kidney disease 07/26/2014  . Goiter 07/27/2014    Assessment: Patient's a 81y.o F on xarelto PTA for hx Afib.  Xarelto has been placed on hold since admission (last dose taken on 09/18/14 at 2100) d/t thoracentesis for R pleural effusion-- s/p thoracentesis on 09/19/14. Patient continues to have low O2 sats s/p procedure. To resume anticoagulation today per Dr. SSheran Favafor hx Afib and to r/o PE.  With scr trending up to 1.77 today (est. Crcl~21), patient is not a candidate for VTE xarelto treatment therapy-- will start heparin therapy until renal function improves.  Today, 09/23/2014:   Heparin level = 0.48 (therapeutic) on heparin 950 units/hr  CBC: Hgb 8.1 (slight improvement), pltc WNL  No obvious or overt bleeding  Goal of Therapy:  Heparin level 0.3-0.7 units/ml Monitor platelets by anticoagulation protocol: Yes   Plan:  -Continue heparin at 950 units/hr  - heparin level daily - monitor s/s bleeding - Monitor CBC, Hgb trend  Dolly Rias RPh 09/23/2014, 7:33 AM Pager [phone]

## 2014-09-23 NOTE — Progress Notes (Signed)
VASCULAR LAB PRELIMINARY  PRELIMINARY  PRELIMINARY  PRELIMINARY  Right upper extremity venous Doppler completed.    Preliminary report:  There is acute DVT noted in the right axillary and subclavian veins.  There is superficial thrombosis noted in the left cephalic vein.   Schuyler Behan, RVT 09/23/2014, 12:29 PM

## 2014-09-23 NOTE — Progress Notes (Signed)
Pt did not have cardiac monitoring order upon arrival to unit.  MD was notified and order was placed for telemetry.  Cardiac monitor was applied to pt.

## 2014-09-23 NOTE — Progress Notes (Signed)
[facility] PROGRESS NOTE  Alison Gross:[id number] DOB: [birth year] DOA: 09/19/2014 PCP: Maximino Greenland, MD  Brief Summary  Alison Gross is a 81 y.o. female with a hx of goiter, HLD, afib on chronic anticoagulation, HTN, DM2 on tradjenta, breast cancer in remission, MGUS, and hx of recurrent R sided pleural effusions who presents to the ED with subjectively worsening sob over the past several days. Pt is s/p thoracentesis 6/16 yielding 800cc of borderline exudative fluid, thought more likely transudative at that time.  In ED, chest imaging demonstrated moderate R sided effusion.  Of note, patient does have a known goiter. Family reported pt had been having more difficulty swallowing foods recently.  Essentially, she has had a chronic goiter, part of which was removed in the 1970s (external portion).  It is large, intrathoracic and was probably the cause of her chronic right effusion which she tolerated for many years.  She has had some marginal enlargement in the last few months which has led to dyspnea requiring thoracentesis, dysphonia, and dysphagia.  Ultimately, she needs treatment for her goiter or she will need palliative measures such as pleurex.  Options being explored are surgical removal, radiation.    STUDIES:  6/16 Rt pleural fluid >> 800 ml, protein 3.2, LDH 49, WBC 614 (84%L), atypical cells present on cytology but were finally reported as reactive mesothelial cells 7/11 PET scan >> moderate/large Rt pleural effusion, massive thyroid goiter 7/12 ADMIT 7/12 CT chest >> third spacing of fluid, Large Rt pleural effusion, very large thyroid, centrilobular emphysema 7/12 Rt thoracentesis with removal of 1200 mL 7/13 developed hypotension and transferred to ICU requiring IVF and vasopressors, Speech assessment >> stage 1 baby food 7/14 weaned off vasopressors 7/14 CT head/neck >> massive Rt thyroid, mod/large Rt effusion 7/14 Echo >> severe LVH, EF 55 to 60%, grade 2  diastolic dysfx, severe LA dilation, mod RA dilation, small/mod pericardial effusion 7/15:  Right IJ TLC removed 7/16:  DVT in the RUE, transfer to [facility] for cardiothoracic surgery consultation in hospital, tapering hydrocortisone  Assessment/Plan   Goiter, large and compressing the right lung and likely contributing to her effusion, swallowing and speech difficulties.  See above.  TSH 1.115 on 6/15 and fT3 and fT4 wnl -  CT surgery consult to discuss risks and benefits for surgery -  Consider pleurex catheter if no surgery > she will likely need before discharge -  F/u with XRT oncology whether they feel radiation might help if she is not a surgical candidate  Recurrent right pleural effusion likely secondary to her goiter -   Status post thoracentesis by interventional radiology on 7/12 -   Cytology from 6/16 and 7/12 both with reactive mesothelial cells -   Pleural fluid Cx NG -   Consider pleurex  Dysphonia due to right focal cord paralysis, likely secondary to stretch of the laryngeal nerve by her goiter. The case was discussed with Dr. Wilburn Cornelia from ENT who will follow up as outpatient for evaluation -  Speech therapy  -  follow-up with ENT in clinic    Dysphagia, likely secondary to large goiter.  Got choked up on dysphagia 2 diet. -   Resume dysphagia 1 diet indefinitely for now  Hypotension, was likely secondary to intravascular volume depletion/dehydration and relative adrenal insufficiency.  She was given IVF and started on stress dose steroids. -  Place stop date for steroids on Sunday morning  Acute DVT in RUE secondary to central line -  Central line  removed on 7/15 -  Continue heparin gtt and plan to transition to warfarin:  Because of acute DVT and renal disease, will not be able to resume a NOAC.  Elevated troponin, chest pain-free, EKG demonstrates no acute ST segment elevation or T-wave inversions. She did previously have some T-wave inversions in the lateral leads  and some mild ST segment elevations in the anterior leads which are stable.  Stable elevation due to CKD and effusion.  Chronic diastolic heart failure, CVP 12  -  Will not resume lasix at this time secondary to poor oral intake and rising BUN  Paroxysmal atrial fibrillation, continue heparin gtt for now pending possible pleur-x catheter placement or surgery -  Will need to transition to warfarin given kidney function   Diabetes mellitus type 2 with well controlled blood sugars, A1c 6.2 last month -  Continue mod dose SSI  Right-sided breast cancer in remission, no evidence of recurrence on recent PET scan -  Continue arimidex   Hyperlipidemia, stable, continue atorvastatin   CKD stage IV,  Creatinine clearance less than 30 , creatinine 1.7 -   Minimize nephrotoxicity and renally dose medications  Hypercalcemia secondary to dehydration and possible thyroid abnormalities (nonparathyroid hypercalcemia), resolved.  -  Vitamin D 54, PTHrp wnl, PTH < 10  Iron deficiency anemia superimposed on MGUS. - continue iron supplementation - B12, folate, and TSH wnl  Moderate malnutrition -  Nutrition  -  supplements  Constipation  -  Continue miralax -  Bisacodyl suppository -  Lactulose 30gm once  Diet:  Dysphagia 1 Access:  PIV IVF:  off Proph:  Heparin gtt pending determination of procedure  Code Status: full  Family Communication: patient, her husband and multiple family members in room Disposition Plan:   [facility] surgery evaluation   Consultants:  PCCM  IR  Procedures:  Thoracentesis of right pleural effusion by IR on 09/19/2014  CT soft tissue neck pending    Antibiotics:  Unasyn 7/13 > 7/14  HPI/Subjective:  Feels better today.  Still hoarse.  Choked even on dysphagia 2 diet.   Right arm is still swollen.  Denies SOB, cough.  Not eating much.    Objective: Filed Vitals:   09/22/14 2105 09/22/14 2126 09/23/14 0511 09/23/14 1405  BP:  119/59  110/62 121/51  Pulse: 93 103 91 98  Temp: 98.4 F (36.9 C)   98.4 F (36.9 C)  TempSrc: Oral  Oral Oral  Resp: '18  17 20  '$ Height:      Weight:      SpO2: 100% 100% 100% 100%    Intake/Output Summary (Last 24 hours) at 09/23/14 1617 Last data filed at 09/23/14 1047  Gross per 24 hour  Intake 296.54 ml  Output    150 ml  Net 146.54 ml   Filed Weights   09/19/14 1300 09/20/14 0551  Weight: 52.164 kg (115 lb) 51.302 kg (113 lb 1.6 oz)   Body mass index is 18.82 kg/(m^2).  Exam:   General:   Cachectic female,No acute distress, hoarse voice  HEENT:  NCAT, MMM  Cardiovascular:  IRRR, nl S1, S2 no mrg, 2+ pulses, warm extremities  Respiratory:   Diminished on the right side to the apex, no focal rales or rhonchi, no increased WOB  Abdomen:   NABS, soft, NT/ND  MSK:   Normal tone and bulk, 1+ pitting edema of bilateral LE.  Tense edema 2+ of RUE  Neuro:  Grossly intact  Data Reviewed: Basic  Metabolic Panel:  Recent Labs Lab 09/20/14 1510 09/21/14 0530 09/21/14 2320 09/22/14 0500 09/23/14 0515  NA 142 140 138 141 140  K 4.0 4.5 4.2 4.2 4.5  CL 108 112* 113* 115* 117*  CO2 29 25 21* 22 19*  GLUCOSE 117* 160* 144* 141* 88  BUN 24* 25* 33* 33* 37*  CREATININE 1.77* 1.83* 2.05* 1.88* 1.90*  CALCIUM 10.6* 9.5 8.5* 9.0 8.5*   Liver Function Tests:  Recent Labs Lab 09/18/14 0946 09/19/14 0750 09/20/14 0355 09/20/14 1510  AST '27 26 21 23  '$ ALT '18 18 16 14  '$ ALKPHOS 78 70 63 63  BILITOT 0.68 0.8 0.8 0.7  PROT 6.8 7.0 6.1* 6.5  ALBUMIN 3.3* 3.4* 2.9* 3.0*   No results for input(s): LIPASE, AMYLASE in the last 168 hours. No results for input(s): AMMONIA in the last 168 hours. CBC:  Recent Labs Lab 09/18/14 0946  09/19/14 0750 09/20/14 0355 09/20/14 1510 09/21/14 0530 09/22/14 0500 09/23/14 0630  WBC 4.5  < > 4.3 4.9 4.6 3.5* 8.2 10.3  NEUTROABS 2.9  --  2.4  --   --   --   --   --   HGB 9.6*  < > 9.1* 8.5* 8.7* 8.2* 7.9* 8.1*  HCT 29.4*  <  > 28.2* 26.3* 28.0* 25.8* 25.0* 26.9*  MCV 87.9  < > 88.4 88.9 89.2 88.7 90.3 93.7  PLT 319  < > 328 296 310 258 272 248  < > = values in this interval not displayed.  Recent Results (from the past 240 hour(s))  Body fluid culture     Status: None   Collection Time: 09/19/14  4:19 PM  Result Value Ref Range Status   Specimen Description PLEURAL RIGHT  Final   Special Requests Normal  Final   Gram Stain   Final    CYTOSPIN SLIDE WBC PRESENT,BOTH PMN AND MONONUCLEAR NO ORGANISMS SEEN    Culture   Final    NO GROWTH 3 DAYS Performed at [facility]    Report Status 09/22/2014 FINAL  Final  Culture, blood (routine x 2)     Status: None (Preliminary result)   Collection Time: 09/20/14  5:45 PM  Result Value Ref Range Status   Specimen Description BLOOD LARM  Final   Special Requests BOTTLES DRAWN AEROBIC AND ANAEROBIC 5CC  Final   Culture   Final    NO GROWTH 3 DAYS Performed at [facility]    Report Status PENDING  Incomplete  Culture, blood (routine x 2)     Status: None (Preliminary result)   Collection Time: 09/20/14  5:54 PM  Result Value Ref Range Status   Specimen Description BLOOD LHAND  Final   Special Requests BOTTLES DRAWN AEROBIC ONLY 1.5CC  Final   Culture   Final    NO GROWTH 3 DAYS Performed at [facility]    Report Status PENDING  Incomplete  MRSA PCR Screening     Status: None   Collection Time: 09/20/14  6:45 PM  Result Value Ref Range Status   MRSA by PCR NEGATIVE NEGATIVE Final    Comment:        The GeneXpert MRSA Assay (FDA approved for NASAL specimens only), is one component of a comprehensive MRSA colonization surveillance program. It is not intended to diagnose MRSA infection nor to guide or monitor treatment for MRSA infections.      Studies: Dg Chest Port 1 View  09/22/2014   CLINICAL DATA:  Right pleural effusion and shortness of breath. Status post right thoracentesis on 09/19/2014. History of thyroid goiter  and breast carcinoma.  EXAM: PORTABLE CHEST - 1 VIEW  COMPARISON:  09/21/2014 and CT of the chest on 09/19/2014.  FINDINGS: Since thoracentesis, there is slight reaccumulation of right pleural fluid with a relatively small effusion identified by chest x-ray. There likely is a small left pleural effusion. Right lower lung atelectasis versus infiltrate present. Stable appearance of jugular central line and large right-sided mediastinal thyroid goiter.  IMPRESSION: Slight reaccumulation of right pleural fluid with relatively small effusion present. There likely is a small left pleural effusion. Right lower lung atelectasis versus infiltrate.   Electronically Signed   By: Aletta Edouard M.D.   On: 09/22/2014 10:41    Scheduled Meds: . albuterol  5 mg Nebulization Once  . anastrozole  1 mg Oral Q breakfast  . atorvastatin  10 mg Oral q1800  . feeding supplement  1 Container Oral TID BM  . ferrous sulfate  325 mg Oral TID WC  . hydrocortisone sod succinate (SOLU-CORTEF) inj  50 mg Intravenous Q12H  . insulin aspart  0-15 Units Subcutaneous TID WC  . insulin aspart  0-5 Units Subcutaneous QHS  . linagliptin  5 mg Oral Daily  . pantoprazole  40 mg Oral Daily  . polyethylene glycol  17 g Oral Daily  . sodium chloride  1,000 mL Intravenous Once   Continuous Infusions: . heparin 950 Units/hr (09/23/14 1353)    Principal Problem:   Pleural effusion, right Active Problems:   HTN (hypertension)   Recurrent right pleural effusion   Stage III chronic kidney disease   Protein-calorie malnutrition, severe   Dyslipidemia   Type 2 diabetes mellitus with renal manifestations   Dyspnea   Hypotension   Dysphagia   Dysphonia   Pleural effusion on right   Substernal goiter   Vocal cord paralysis    Time spent:  40 minutes, including time spent coordinating transfer to [place], [facility]  [facility] Pager [phone]. If 7PM-7AM, please contact night-coverage at [email or web address],  password [facility] 09/23/2014, 4:17 PM  LOS: 2 days

## 2014-09-24 ENCOUNTER — Inpatient Hospital Stay (HOSPITAL_COMMUNITY): Payer: Commercial Managed Care - HMO

## 2014-09-24 DIAGNOSIS — J90 Pleural effusion, not elsewhere classified: Secondary | ICD-10-CM

## 2014-09-24 DIAGNOSIS — R222 Localized swelling, mass and lump, trunk: Secondary | ICD-10-CM

## 2014-09-24 DIAGNOSIS — E049 Nontoxic goiter, unspecified: Secondary | ICD-10-CM

## 2014-09-24 LAB — CBC
HCT: 29.3 % — ABNORMAL LOW (ref 36.0–46.0)
Hemoglobin: 9.1 g/dL — ABNORMAL LOW (ref 12.0–15.0)
MCH: 27.5 pg (ref 26.0–34.0)
MCHC: 31.1 g/dL (ref 30.0–36.0)
MCV: 88.5 fL (ref 78.0–100.0)
PLATELETS: 240 10*3/uL (ref 150–400)
RBC: 3.31 MIL/uL — ABNORMAL LOW (ref 3.87–5.11)
RDW: 16.5 % — ABNORMAL HIGH (ref 11.5–15.5)
WBC: 6.2 10*3/uL (ref 4.0–10.5)

## 2014-09-24 LAB — BASIC METABOLIC PANEL
Anion gap: 6 (ref 5–15)
BUN: 31 mg/dL — ABNORMAL HIGH (ref 6–20)
CALCIUM: 8.8 mg/dL — AB (ref 8.9–10.3)
CO2: 17 mmol/L — AB (ref 22–32)
CREATININE: 1.64 mg/dL — AB (ref 0.44–1.00)
Chloride: 117 mmol/L — ABNORMAL HIGH (ref 101–111)
GFR calc Af Amer: 33 mL/min — ABNORMAL LOW (ref >60)
GFR calc non Af Amer: 29 mL/min — ABNORMAL LOW (ref >60)
Glucose, Bld: 118 mg/dL — ABNORMAL HIGH (ref 65–99)
Potassium: 4.1 mmol/L (ref 3.5–5.1)
Sodium: 140 mmol/L (ref 135–145)

## 2014-09-24 LAB — HEPARIN LEVEL (UNFRACTIONATED): Heparin Unfractionated: 0.64 IU/mL (ref 0.30–0.70)

## 2014-09-24 LAB — GLUCOSE, CAPILLARY
GLUCOSE-CAPILLARY: 176 mg/dL — AB (ref 65–99)
GLUCOSE-CAPILLARY: 193 mg/dL — AB (ref 65–99)
Glucose-Capillary: 115 mg/dL — ABNORMAL HIGH (ref 65–99)

## 2014-09-24 MED ORDER — FUROSEMIDE 10 MG/ML IJ SOLN
40.0000 mg | Freq: Once | INTRAMUSCULAR | Status: AC
  Administered 2014-09-24: 40 mg via INTRAVENOUS
  Filled 2014-09-24: qty 4

## 2014-09-24 MED ORDER — DILTIAZEM HCL 30 MG PO TABS
30.0000 mg | ORAL_TABLET | Freq: Four times a day (QID) | ORAL | Status: DC
  Administered 2014-09-24 – 2014-09-25 (×4): 30 mg via ORAL
  Filled 2014-09-24 (×7): qty 1

## 2014-09-24 MED ORDER — DILTIAZEM HCL 100 MG IV SOLR
5.0000 mg/h | INTRAVENOUS | Status: DC
  Administered 2014-09-24: 5 mg/h via INTRAVENOUS
  Administered 2014-09-24: 10 mg/h via INTRAVENOUS
  Filled 2014-09-24 (×2): qty 100

## 2014-09-24 MED ORDER — ALBUMIN HUMAN 25 % IV SOLN
12.5000 g | Freq: Once | INTRAVENOUS | Status: AC
  Administered 2014-09-24: 12.5 g via INTRAVENOUS
  Filled 2014-09-24: qty 50

## 2014-09-24 NOTE — Progress Notes (Signed)
ANTICOAGULATION CONSULT NOTE -follow up  Pharmacy Consult for heparin Indication: hx Afib, r/o VTE  No Known Allergies  Patient Measurements: Height: '5\' 5"'$  (165.1 cm) Weight: 134 lb 14.4 oz (61.19 kg) IBW/kg (Calculated) : 57  Heparin weight: 52 kg  Vital Signs: Temp: 98.6 F (37 C) (07/17 0324) Temp Source: Oral (07/17 0324) BP: 117/51 mmHg (07/17 0324) Pulse Rate: 89 (07/17 0324)  Labs:  Recent Labs  09/21/14 0813 09/21/14 1901  09/22/14 0500 09/23/14 0515 09/23/14 0525 09/23/14 0630 09/24/14 0315  HGB  --   --   < > 7.9*  --   --  8.1* 9.1*  HCT  --   --   --  25.0*  --   --  26.9* 29.3*  PLT  --   --   --  272  --   --  248 240  APTT 34 77*  --  91*  --   --   --   --   HEPARINUNFRC 0.62 0.49  --  0.46  --  0.48  --  0.64  CREATININE  --   --   < > 1.88* 1.90*  --   --  1.64*  < > = values in this interval not displayed.  Estimated Creatinine Clearance: 25 mL/min (by C-G formula based on Cr of 1.64).   Medical History: Past Medical History  Diagnosis Date  . Hypercholesterolemia     takes Crestor daily  . Thyroid disease     had iodine radiation  . Hypertension     takes Hyzaar daily  . Dysrhythmia     takes Carvedilol daily  . Pneumonia     history of;last time about 4-42yr ago  . GERD (gastroesophageal reflux disease)     takes Protonix daily  . Gastric ulcer   . History of blood transfusion   . History of colon polyps   . Anemia     takes iron pill daily  . Diabetes mellitus without complication     takes Tradjenta daily  . History of radiation therapy 07/12/12-08/26/12    right breast/  . Paroxysmal atrial fibrillation     PCP EKG 09/27/2013: A. Fibrillation.. EKG 09/29/2013: S. Tach.  . Breast cancer 04/12/12    right-pos lymph node/left-DCIS  . Shortness of breath on exertion   . Bruit of left carotid artery   . Acute upper GI bleeding 01/24/2012  . MGUS (monoclonal gammopathy of unknown significance) 04/21/2013  . Osteoporosis 11/29/2013   . Recurrent right pleural effusion 07/26/2014  . Stage III chronic kidney disease 07/26/2014  . Goiter 07/27/2014  . Substernal goiter 12/23/2012  . Pleural effusion, right 12/23/2012  . Type 2 diabetes mellitus with renal manifestations 07/26/2014  . Dysphonia 09/20/2014  . Vocal cord paralysis     Suspected due to massive substernal goiter    Assessment: Patient's a 81y.o F on xarelto PTA for hx Afib.  Xarelto has been placed on hold since admission (last dose taken on 09/18/14 at 2100) d/t thoracentesis for R pleural effusion-- s/p thoracentesis on 09/19/14. Patient continues to have low O2 sats s/p procedure. Resumed anticoagulation 7/16 per Dr. SSheran Favafor hx Afib and to r/o PE.  Patient was started on heparin therapy until renal function improves. SCr improving 1.9>>1.64, CrCl~25.  Today, 09/24/2014:   Heparin level = 0.64 (remains therapeutic) on heparin 950 units/hr  CBC: Hgb 9.1 (improvement), pltc WNL stable  No obvious or overt bleeding  Goal of Therapy:  Heparin level  0.3-0.7 units/ml Monitor platelets by anticoagulation protocol: Yes   Plan:  -Continue heparin at 950 units/hr  - heparin level daily - monitor s/s bleeding - Monitor CBC, Hgb trend  Elicia Lamp, PharmD Clinical Pharmacist Pager [phone] 09/24/2014 7:30 AM

## 2014-09-24 NOTE — Consult Note (Addendum)
[address]       [place] [address]             [phone]        [place] D [facility] Medical Record #[id number] Date of Birth: 06/20/1932  Referring: Janece Canterbury, MD Primary Care: Maximino Greenland, MD  Chief Complaint:    Chief Complaint  Patient presents with  . Shortness of Breath    History of Present Illness:    Alison Gross is a 81 yo African American Female with known history of Thyroid Goiter, Right Sided Breast Cancer in Remission, Hyperlipidemia, Atrial Fibrillation on chronic anticoagulation, HTN, DM, and Recurrent pleural effusions.  The patient presented to [facility] Emergency Department on 09/19/2014 with complaints of worsening shortness of breath for the past several days.  She had undergone several thoracentesis is the past with most recent being 6/16 which showed 800 cc of exudative fluid.  CT chest obtained in the ED showed a moderate right sided pleural effusion.  The patient was admitted to the medicine service for further workup.  The patient underwent IR Thoracentesis which removed 1.2 liters hazy, amber fluid.  The fluid was sent for cytology which revealed reactive mesothelial cells without evidence of malignancy.  Fluid cultures were negative for bacteria. The patient also complained of dysphagia while in the hospital and SSLP consult was obtained and showed some dysfunction and her diet was adjusted.  Post procedure the patient developed hypotension with acute shock.  She required pressor support.  Workup later revealed evidence of adrenal insufficiency.  She was treated with steroids and fluid balance was corrected.  Troponin was mildly elevated and she was ruled out for acute cardiac event.  Once patient was stabilized it was felt surgical intervention may be required on her goiter.  Cardiac surgery was contacted and plans were made for elective outpatient consultation to consider surgical resection vs. Pleur-x catheter for  palliative relief.  She was scheduled to see Dr. Roxan Hockey this coming Tuesday 09/26/2014.  However, the patient's family was concerned that she was not safe for hospital discharge, so she was transferred to [facility] for further evaluation and treatment.  Currently the patient is doing okay.  She does have some mild shortness of breath.  She is up in bed with oxygen with sats at 99%.  CXR was obtained this morning and shows a small right sided pleural effusion at this time.  The patient continues to have dysphagia but has been tolerating a mechanical soft diet.  She has some edema in her hands and legs likely due to the the IV fluid she is receiving.  However, due to to recent hypotension she is not currently on Lasix.  The patient is accompanied by her family at bedside.       Current Activity/ Functional Status: Patient was independent with mobility/ambulation, transfers, ADL's, IADL's.   Zubrod Score: At the time of surgery this patient's most appropriate activity status/level should be described as: '[]'$     0    Normal activity, no symptoms '[x]'$     1    Restricted in physical strenuous activity but ambulatory, able to do out light work '[]'$     2    Ambulatory and capable of self care, unable to do work activities, up and about                 more than 50%  Of the time                            '[]'$   3    Only limited self care, in bed greater than 50% of waking hours '[]'$     4    Completely disabled, no self care, confined to bed or chair '[]'$     5    Moribund  Past Medical History  Diagnosis Date  . Hypercholesterolemia     takes Crestor daily  . Thyroid disease     had iodine radiation  . Hypertension     takes Hyzaar daily  . Dysrhythmia     takes Carvedilol daily  . Pneumonia     history of;last time about 4-27yr ago  . GERD (gastroesophageal reflux disease)     takes Protonix daily  . Gastric ulcer   . History of blood transfusion   . History of colon polyps   . Anemia     takes  iron pill daily  . Diabetes mellitus without complication     takes Tradjenta daily  . History of radiation therapy 07/12/12-08/26/12    right breast/  . Paroxysmal atrial fibrillation     PCP EKG 09/27/2013: A. Fibrillation.. EKG 09/29/2013: S. Tach.  . Breast cancer 04/12/12    right-pos lymph node/left-DCIS  . Shortness of breath on exertion   . Bruit of left carotid artery   . Acute upper GI bleeding 01/24/2012  . MGUS (monoclonal gammopathy of unknown significance) 04/21/2013  . Osteoporosis 11/29/2013  . Recurrent right pleural effusion 07/26/2014  . Stage III chronic kidney disease 07/26/2014  . Goiter 07/27/2014  . Substernal goiter 12/23/2012  . Pleural effusion, right 12/23/2012  . Type 2 diabetes mellitus with renal manifestations 07/26/2014  . Dysphonia 09/20/2014  . Vocal cord paralysis     Suspected due to massive substernal goiter    Past Surgical History  Procedure Laterality Date  . Carpal tunnel release      left  . Breast biopsy    . Esophagogastroduodenoscopy  01/24/2012    Procedure: ESOPHAGOGASTRODUODENOSCOPY (EGD);  Surgeon: MJeryl Columbia MD;  [facility];  Service: Endoscopy;  Laterality: N/A;  . Thyroid goiter removed    . Cyst removed from back of neck    . Knee surgery      right with rods  . Esophagogastroduodenoscopy    . Colonoscopy    . Total mastectomy Bilateral 05/10/2012    Procedure: RIGHT modified mastectomy; LEFT total mastectomy;  Surgeon: CHaywood Lasso MD;  [facility]  Service: General;  Laterality: Bilateral;  . Axillary sentinel node biopsy Right 05/10/2012    Procedure: AXILLARY SENTINEL lymph  NODE BIOPSY;  Surgeon: CHaywood Lasso MD;  [facility]  Service: General;  Laterality: Right;  nuclear medicine injection right side  7:00 am   . Abdominal hysterectomy      fibroids, with bilateral SO    History  Smoking status  . Former Smoker -- 0.00 packs/day  . Types: Cigarettes  . Quit date: 07/26/1971  Smokeless  tobacco  . Never Used    History  Alcohol Use No    History   Social History  . Marital Status: Married    Spouse Name: JJeneen Rinks . Number of Children: 2  . Years of Education: N/A   Occupational History  . Not on file.   Social History Main Topics  . Smoking status: Former Smoker -- 0.00 packs/day    Types: Cigarettes    Quit date: 07/26/1971  . Smokeless tobacco: Never Used  . Alcohol Use: No  . Drug Use: No  .  Sexual Activity: Not Currently    Birth Control/ Protection: Surgical     Comment: menarche age 89,1st live birth [birth year], P2, no HRT   Other Topics Concern  . Not on file   Social History Narrative   Married.  Ambulates independently.      No Known Allergies  Current Facility-Administered Medications  Medication Dose Route Frequency Provider Last Rate Last Dose  . acetaminophen (TYLENOL) tablet 650 mg  650 mg Oral Q6H PRN Donne Hazel, MD      . albuterol (PROVENTIL) (2.5 MG/3ML) 0.083% nebulizer solution 2.5 mg  2.5 mg Nebulization Q4H PRN Janece Canterbury, MD   2.5 mg at 09/24/14 0554  . albuterol (PROVENTIL) (2.5 MG/3ML) 0.083% nebulizer solution 5 mg  5 mg Nebulization Once Donne Hazel, MD   5 mg at 09/19/14 1933  . anastrozole (ARIMIDEX) tablet 1 mg  1 mg Oral Q breakfast Donne Hazel, MD   1 mg at 09/24/14 0935  . atorvastatin (LIPITOR) tablet 10 mg  10 mg Oral q1800 Donne Hazel, MD   10 mg at 09/23/14 1706  . bisacodyl (DULCOLAX) suppository 10 mg  10 mg Rectal Daily PRN Janece Canterbury, MD      . diltiazem (CARDIZEM) 100 mg in dextrose 5 % 100 mL (1 mg/mL) infusion  5-15 mg/hr Intravenous Continuous Dianne Dun, NP 10 mL/hr at 09/24/14 0930 10 mg/hr at 09/24/14 0930  . feeding supplement (BOOST / RESOURCE BREEZE) liquid 1 Container  1 Container Oral TID BM Janece Canterbury, MD   1 Container at 09/23/14 2135  . feeding supplement (ENSURE ENLIVE) (ENSURE ENLIVE) liquid 237 mL  237 mL Oral BID BM Janece Canterbury, MD   237 mL at 09/24/14 1000   . ferrous sulfate tablet 325 mg  325 mg Oral TID WC Donne Hazel, MD   325 mg at 09/24/14 0929  . heparin ADULT infusion 100 units/mL (25000 units/250 mL)  950 Units/hr Intravenous Continuous Anh P Pham, RPH 9.5 mL/hr at 09/23/14 1353 950 Units/hr at 09/23/14 1353  . insulin aspart (novoLOG) injection 0-15 Units  0-15 Units Subcutaneous TID WC Donne Hazel, MD   2 Units at 09/21/14 1817  . insulin aspart (novoLOG) injection 0-5 Units  0-5 Units Subcutaneous QHS Donne Hazel, MD   0 Units at 09/19/14 2152  . linagliptin (TRADJENTA) tablet 5 mg  5 mg Oral Daily Donne Hazel, MD   5 mg at 09/24/14 0935  . morphine 2 MG/ML injection 2 mg  2 mg Intravenous Q4H PRN Donne Hazel, MD   2 mg at 09/22/14 0546  . ondansetron (ZOFRAN) injection 4 mg  4 mg Intravenous Q6H PRN Donne Hazel, MD   4 mg at 09/19/14 2040  . pantoprazole (PROTONIX) EC tablet 40 mg  40 mg Oral Daily Chesley Mires, MD   40 mg at 09/24/14 0929  . polyethylene glycol (MIRALAX / GLYCOLAX) packet 17 g  17 g Oral Daily Anders Simmonds, MD   17 g at 09/24/14 0934  . sodium chloride 0.9 % bolus 1,000 mL  1,000 mL Intravenous Once Gardiner Barefoot, NP        Prescriptions prior to admission  Medication Sig Dispense Refill Last Dose  . acetaminophen (TYLENOL) 500 MG tablet Take 500 mg by mouth every 6 (six) hours as needed for moderate pain or headache.   Past Month at Unknown time  . anastrozole (ARIMIDEX) 1 MG tablet TAKE 1 TABLET (1 MG  TOTAL) BY MOUTH DAILY. (Patient taking differently: Take 1 mg by mouth daily with breakfast. ) 30 tablet 5 09/18/2014 at Unknown time  . atorvastatin (LIPITOR) 10 MG tablet Take 1 tablet (10 mg total) by mouth daily at 6 PM. 30 tablet 0 09/18/2014 at Unknown time  . feeding supplement, ENSURE ENLIVE, (ENSURE ENLIVE) LIQD Take 237 mLs by mouth 2 (two) times daily between meals. 237 mL 12 Past Week at Unknown time  . ferrous sulfate 325 (65 FE) MG tablet Take 1 tablet (325 mg total) by mouth 3  (three) times daily with meals. 90 tablet 0 Past Week at Unknown time  . furosemide (LASIX) 40 MG tablet Take 40 mg by mouth daily as needed for fluid or edema.   2 09/18/2014 at Unknown time  . linagliptin (TRADJENTA) 5 MG TABS tablet Take 5 mg by mouth daily.   09/18/2014 at Unknown time  . losartan (COZAAR) 100 MG tablet Take 100 mg by mouth daily.  2 09/18/2014 at Unknown time  . Multiple Vitamin (MULTIVITAMIN WITH MINERALS) TABS Take 1 tablet by mouth every other day.    09/18/2014 at Unknown time  . Rivaroxaban (XARELTO) 15 MG TABS tablet Take 1 tablet (15 mg total) by mouth daily. 30 tablet 0 09/18/2014 at 2100  . levofloxacin (LEVAQUIN) 750 MG tablet Take 1 tablet (750 mg total) by mouth every other day. (Patient not taking: Reported on 09/19/2014) 2 tablet 0 Not Taking at Unknown time    Family History  Problem Relation Age of Onset  . Brain cancer Mother   . Aneurysm Father   . Diabetes Sister   . Diabetes Brother   . Diabetes Sister   . Diabetes Brother   . Heart failure Sister     Review of Systems:  Pertinent items are noted in HPI.     Cardiac Review of Systems: Y or N  Chest Pain [   n ]  Resting SOB [ y  ] Exertional SOB  [ y ]  Orthopnea [  ]   Pedal Edema [ y  ]    Palpitations Blue.Reese  ] Syncope  [  ]   Presyncope [   ]  General Review of Systems: [Y] = yes [  ]=no Constitional: recent weight change [  ]; anorexia [  ]; fatigue [  ]; nausea [  ]; night sweats [  ]; fever [  ]; or chills [  ]                                                               Dental: poor dentition[  ]; Last Dentist visit:   Eye : blurred vision [  ]; diplopia [   ]; vision changes [  ];  Amaurosis fugax[  ]; Resp: cough [  ];  wheezing[  ];  hemoptysis[  ]; shortness of breath[ y ]; paroxysmal nocturnal dyspnea[  ]; dyspnea on exertion[y  ]; or orthopnea[  ];  GI:  gallstones[  ], vomiting[  ];  dysphagia[  ]; melena[  ];  hematochezia [  ]; heartburn[  ];   Hx of  Colonoscopy[  ]; GU: kidney stones  [  ]; hematuria[ y ];   dysuria [  ];  nocturia[  ];  history of     obstruction [  ]; urinary frequency [  ]             Skin: rash, swelling[  ];, hair loss[  ];  peripheral edema[  ];  or itching[  ]; Musculosketetal: myalgias[  ];  joint swelling[  ];  joint erythema[  ];  joint pain[  ];  back pain[  ];  Heme/Lymph: bruising[  ];  bleeding[  ];  anemia[  ];  Neuro: TIA[  ];  headaches[  ];  stroke[  ];  vertigo[  ];  seizures[  ];   paresthesias[  ];  difficulty walking[  ];  Psych:depression[  ]; anxiety[  ];  Endocrine: diabetes[y  ];  thyroid dysfunction[y  ];  Immunizations: Flu [  ]; Pneumococcal[  ];  Other:  Physical Exam: BP 117/51 mmHg  Pulse 89  Temp(Src) 98.6 F (37 C) (Oral)  Resp 18  Ht '5\' 5"'$  (1.651 m)  Wt 134 lb 14.4 oz (61.19 kg)  BMI 22.45 kg/m2  SpO2 99%  General appearance: alert, cooperative and no distress Head: Normocephalic, without obvious abnormality, atraumatic Neck: no adenopathy, no carotid bruit, no JVD, supple, symmetrical, trachea midline and thyroid: enlarged Resp: diminished breath sounds right base Cardio: irregularly irregular rhythm GI: soft, non-tender; bowel sounds normal; no masses,  no organomegaly Extremities: edema trace Neurologic: Grossly normal  Diagnostic Studies & Laboratory data:     Recent Radiology Findings:   Dg Chest Port 1 View  09/24/2014   CLINICAL DATA:  Shortness of breath.  Pleural effusion.  EXAM: PORTABLE CHEST - 1 VIEW  COMPARISON:  Multiple prior studies including 09/22/2014  FINDINGS: Right IJ central line has been removed. Heart is enlarged. Bilateral pleural effusions again noted. There is right basilar opacity, stable in appearance and consistent with atelectasis or infiltrate. Left lower lung opacity obscures the hemidiaphragm and is consistent with atelectasis or infiltrate. Right mediastinal mass again noted and stable.  IMPRESSION: Persistent bilateral lower lobe opacities and pleural effusions.  Removal of  right IJ central line.   Electronically Signed   By: Nolon Nations M.D.   On: 09/24/2014 08:48   Dg Chest Port 1 View  09/22/2014   CLINICAL DATA:  Right pleural effusion and shortness of breath. Status post right thoracentesis on 09/19/2014. History of thyroid goiter and breast carcinoma.  EXAM: PORTABLE CHEST - 1 VIEW  COMPARISON:  09/21/2014 and CT of the chest on 09/19/2014.  FINDINGS: Since thoracentesis, there is slight reaccumulation of right pleural fluid with a relatively small effusion identified by chest x-ray. There likely is a small left pleural effusion. Right lower lung atelectasis versus infiltrate present. Stable appearance of jugular central line and large right-sided mediastinal thyroid goiter.  IMPRESSION: Slight reaccumulation of right pleural fluid with relatively small effusion present. There likely is a small left pleural effusion. Right lower lung atelectasis versus infiltrate.   Electronically Signed   By: Aletta Edouard M.D.   On: 09/22/2014 10:41     I have independently reviewed the above radiologic studies.  Recent Lab Findings: Lab Results  Component Value Date   WBC 6.2 09/24/2014   HGB 9.1* 09/24/2014   HCT 29.3* 09/24/2014   PLT 240 09/24/2014   GLUCOSE 118* 09/24/2014   ALT 14 09/20/2014   AST 23 09/20/2014   NA 140 09/24/2014   K 4.1 09/24/2014   CL 117* 09/24/2014   CREATININE 1.64* 09/24/2014   BUN 31* 09/24/2014   CO2  17* 09/24/2014   TSH 1.115 08/23/2014   INR 1.62* 09/20/2014   HGBA1C 6.2* 07/27/2014     Transthoracic Echocardiography  Patient:  Shaylen, Nephew MR #:    [id number] Study Date: 09/21/2014 Gender:   F Age:    64 Height:   165.1 cm Weight:   53.1 kg BSA:    1.55 m^2 Pt. Status: Room:    [place], MD ATTENDING  Janece Canterbury [id number] ADMITTING  Donne Hazel SONOGRAPHER Donata Clay ORDERING   [place], [place] Orlena Sheldon,  [place]  cc:  ------------------------------------------------------------------- LV EF: 55% -  60%  ------------------------------------------------------------------- Indications:   Hypotension - chronic 458.1.  ------------------------------------------------------------------- History:  PMH: Bilateral mastectomy. Type 2 diabetes with renal manifestations. Right pleural effusion. Elevated troponin. Acute on chronic diastolic heart failure. CKD stage 3. Dyspnea. Atrial fibrillation. Risk factors: Dyslipidemia.  ------------------------------------------------------------------- Study Conclusions  - Left ventricle: Cannot exclude infiltrative cardiomyopathy with speckled pattern. The cavity size was normal. There was severe concentric hypertrophy. The appearance was consistent with hypertensive changes. Systolic function was normal. The estimated ejection fraction was in the range of 55% to 60%. Wall motion was normal; there were no regional wall motion abnormalities. Features are consistent with a pseudonormal left ventricular filling pattern, with concomitant abnormal relaxation and increased filling pressure (grade 2 diastolic dysfunction). Doppler parameters are consistent with both elevated ventricular end-diastolic filling pressure and elevated left atrial filling pressure. - Aortic valve: Quadricuspid. - Left atrium: The atrium was severely dilated. - Right atrium: The atrium was moderately dilated. - Atrial septum: No defect or patent foramen ovale was identified. - Pericardium, extracardiac: A small to moderate, free-flowing pericardial effusion was identified circumferential to the heart. The fluid had no internal echoes.There was no evidence of hemodynamic compromise. - Recommendations: Compared to 07/27/2014, no significant change. Pericardial effusion may be slightly larger, but no tamponade and fluid is  clear.  Recommendations: Compared to 07/27/2014, no significant change. Pericardial effusion may be slightly larger, but no tamponade and fluid is clear.       Transthoracic echocardiography. M-mode, complete 2D, spectral Doppler, and color Doppler. Birthdate: Patient birthdate: 07/15/1932. Age: Patient is 81 yr old. Sex: Gender: female.  BMI: 19.5 kg/m^2. Blood pressure: 130/67 Patient status: Inpatient. Study date: Study date: 09/21/2014. Study time: 11:40 AM. Location: Bedside.  -------------------------------------------------------------------  ------------------------------------------------------------------- Left ventricle: Cannot exclude infiltrative cardiomyopathy with speckled pattern. The cavity size was normal. There was severe concentric hypertrophy. The appearance was consistent with hypertensive changes. Systolic function was normal. The estimated ejection fraction was in the range of 55% to 60%. Wall motion was normal; there were no regional wall motion abnormalities. Features are consistent with a pseudonormal left ventricular filling pattern, with concomitant abnormal relaxation and increased filling pressure (grade 2 diastolic dysfunction). Doppler parameters are consistent with both elevated ventricular end-diastolic filling pressure and elevated left atrial filling pressure.  ------------------------------------------------------------------- Aortic valve:  Quadricuspid. Doppler:  There was no stenosis. There was no significant regurgitation.  ------------------------------------------------------------------- Aorta: The aorta was normal, not dilated, and non-diseased.  ------------------------------------------------------------------- Mitral valve:  Structurally normal valve.  Leaflet separation was normal. Doppler: Transvalvular velocity was within the normal range. There was no evidence for stenosis. There was  trivial regurgitation.  Peak gradient (D): 4 mm Hg.  ------------------------------------------------------------------- Left atrium: The atrium was severely dilated.  ------------------------------------------------------------------- Atrial septum: No defect or patent foramen ovale was identified.  ------------------------------------------------------------------- Right ventricle: The cavity size was normal. Wall thickness was normal. Systolic function  was normal.  ------------------------------------------------------------------- Pulmonic valve:  Structurally normal valve.  Cusp separation was normal. Doppler: Transvalvular velocity was within the normal range. There was no significant regurgitation.  ------------------------------------------------------------------- Tricuspid valve:  Structurally normal valve.  Leaflet separation was normal. Doppler: Transvalvular velocity was within the normal range. There was no significant regurgitation.  ------------------------------------------------------------------- Pulmonary artery:  The main pulmonary artery was normal-sized.  ------------------------------------------------------------------- Right atrium: The atrium was moderately dilated.  ------------------------------------------------------------------- Pericardium: A small to moderate, free-flowing pericardial effusion was identified circumferential to the heart. The fluid had no internal echoes.There was no evidence of hemodynamic compromise.  ------------------------------------------------------------------- Post procedure conclusions Ascending Aorta:  - The aorta was normal, not dilated, and non-diseased.  ------------------------------------------------------------------- Measurements  Left ventricle             Value    Reference LV ID, ED, PLAX chordal    (L)   28.9 mm   43 - 52 LV ID, ES, PLAX chordal     (L)   17.9 mm   23 - 38 LV fx shortening, PLAX chordal     38  %   >=29 LV PW thickness, ED          17.1 mm   --------- IVS/LV PW ratio, ED          0.74     <=1.3 LV e&', lateral             3.92 cm/s  --------- LV E/e&', lateral            25.31    --------- LV e&', medial             4.9  cm/s  --------- LV E/e&', medial            20.24    --------- LV e&', average             4.41 cm/s  --------- LV E/e&', average            22.49    ---------  Ventricular septum           Value    Reference IVS thickness, ED           12.6 mm   ---------  LVOT                  Value    Reference LVOT ID, S               17  mm   --------- LVOT area               2.27 cm^2  ---------  Aorta                 Value    Reference Aortic root ID, ED           29  mm   ---------  Left atrium              Value    Reference LA ID, A-P, ES             36  mm   --------- LA ID/bsa, A-P         (H)   2.32 cm/m^2 <=2.2 LA volume, ES, 1-p A4C         58.3 ml   --------- LA volume/bsa, ES, 1-p A4C       37.5 ml/m^2 --------- LA volume, ES, 1-p A2C         78.4 ml   ---------  LA volume/bsa, ES, 1-p A2C       50.4 ml/m^2 ---------  Mitral valve              Value    Reference Mitral E-wave peak velocity      99.2 cm/s  --------- Mitral A-wave peak velocity      92.4 cm/s  --------- Mitral deceleration time        211  ms   150 - 230 Mitral peak gradient, D        4   mm Hg --------- Mitral E/A ratio, peak         1.1     ---------  Systemic veins             Value     Reference Estimated CVP             8   mm Hg ---------  Right ventricle            Value    Reference TAPSE                 15.5 mm   --------- RV s&', lateral, S           11.1 cm/s  ---------  Legend: (L) and (H) mark values outside specified reference range.  ------------------------------------------------------------------- Prepared and Electronically Authenticated by  Adrian Prows, MD 2016-07-14T18:27:33   CT CHEST WITHOUT CONTRAST  TECHNIQUE: Multidetector CT imaging of the chest was performed following the standard protocol without IV contrast.  COMPARISON: Multiple exams, including 09/18/2014 and 09/19/2014  FINDINGS: Mediastinum/Nodes: Prominent thyroid goiter with massively enlarged right lobe extending into the right hemithorax and measuring approximately 13.9 by 8.0 by 7.1 cm.  Diffuse subcutaneous edema. Aortic arch and branch vessel atherosclerosis. Low-level stranding in the mediastinum with small to moderate pericardial effusion.  Lungs/Pleura: Right pleural effusion occupies greater than 50% of right hemithoracic volume and is associated with passive atelectasis.  Centrilobular emphysema. Old granulomatous disease. Trace left pleural effusion.  Upper abdomen: Stranding in the intra-abdominal adipose tissue.  Musculoskeletal: Thoracic spondylosis.  IMPRESSION: 1. Considerable third spacing of fluid with subcutaneous, mediastinal, and upper abdominal infiltrative edema. 2. Large right and trace left pleural effusions. 3. Very large thyroid goiter, with masslike extension of the right lobe into the right hemithorax as detailed on prior exams. 4. Centrilobular emphysema.   Electronically Signed  By: Van Clines M.D.  On: 09/19/2014 09:57    CT NECK WITHOUT CONTRAST  TECHNIQUE: Multidetector CT imaging of the neck was performed following  the standard protocol without intravenous contrast.  COMPARISON: PET-CT 09/18/2014, 10/28/2013. Chest CT 10/28/2013.  FINDINGS: Chronic massive right thyroid lobe enlargement, with the vast majority of the enlarged right lobe occupying the mediastinum and right hemithorax.  Mediastinal involvement by right thyroid goiter seen as far back as 2004 (with about 7 cm of right thyroid lobe in the mediastinum at that time).  Currently the right lobe extends as far caudally as the right mainstem bronchus and encompasses approximately 8.0 by 9.8 x 15.0 cm (AP by transverse by CC), with an estimated volume of 588 mL. Compare that to a normal splenic volume of no larger than 412 mL.  Chronic heterogeneous density throughout the enlarged lobe. The left thyroid lobe is also chronically but more modestly enlarged encompassing about 4.3 x 3.1 x 6.4 cm (AP by transverse by CC). Despite the marked thyroid enlargement there is no significant mass effect on the trachea, carina, or right mainstem.  Asymmetry  of the larynx suggests right vocal cord paralysis. No laryngeal or pharyngeal soft tissue mass is evident. Negative parapharyngeal, retropharyngeal, and sublingual spaces. Negative non contrast submandibular and parotid glands. No cervical lymphadenopathy identified in the absence of contrast. Negative visualized noncontrast brain parenchyma. Postoperative changes to the globes.  Chronic sphenoid sinus periosteal thickening, but Visualized paranasal sinuses and mastoids are clear. Absent dentition. No acute or suspicious osseous lesion identified. Small sclerotic focus in the right manubrium is stable since 2004.  Right IJ approach central line in place.  Layering moderate to large right pleural effusion and smaller left pleural effusion. Compressive atelectasis in both lungs greater on the right.  IMPRESSION: 1. Massive chronic enlargement of the right thyroid lobe, with  the vast majority of the enlarged lobe occupying the mediastinum and right hemithorax as far down as the right mainstem bronchus. See details above. 2. Despite this, no significant mass effect on the airway or trachea. 3. Layering moderate to large right and smaller left pleural effusions with right greater than left pulmonary compressive atelectasis. 4. Suggestion of right vocal cord paralysis. No laryngeal/pharyngeal mass or cervical lymphadenopathy identified in the absence of contrast.   Electronically Signed  By: Genevie Ann M.D.  On: 09/21/2014 10:06    Assessment / Plan:    1. Recurrent Right Pleural Effusion- S/P Thoracentesis 7/12- CXR with small effusion on R- may need pleur-x at some point 2. Large Substernal Goiter 3. Adrenal Insufficiency 4. DM 5. Chronic A. Fib- on Xarelto as outpatient, currently on hold 6. Dispo- patient was scheduled to see Dr. Roxan Hockey in clinic on Tuesday 7/19.  Patient is frail may likely not be a candidate for surgical resection of goiter, but may benefit from Pleur-x at some point for palliative measures.  Dr. [name] aware of patient, will see at some point today 5. H/O Right sided Breast Cancer in Remission      I  spent 30 minutes counseling the patient face to face and 50% or more the  time was spent in counseling and coordination of care. The total time spent in the appointment was 40 minutes.    Junie Panning Barrett PA-C  09/24/2014 10:08 AM   I have seen and examined the patient and agree with the assessment as outlined above.  The patient is a 81 year old African-American female with multiple medical problems who has been referred for possible surgical resection of a large mediastinal mass c/w likely massive substernal goiter associated with mass effect, chronic recurrent right pleural effusion and small pericardial effusion. The patient admits to progressive declining health over the past year or so. She has been followed by Dr. Einar Gip  with history of heart murmur, chronic congestive heart failure, and atrial fibrillation. She reports progressive symptoms of shortness of breath and chest discomfort that could be at least partially related to the patient's recurrent right pleural effusion and small pericardial effusion, but her symptoms are very concerning for the presence of angina pectoris and/or worsening congestive heart failure. The patient has worsening orthopnea, PND, chest discomfort, and bilateral lower extremity edema.  During her current hospitalization the patient developed transient hypotension after thoracentesis performed at [facility] and serum troponin levels were notably positive. She has been diagnosed with adrenal insufficiency and treated with steroids.  She has a prominent systolic murmur on physical exam and echocardiogram performed recently reportedly demonstrated the presence of a quadricuspid aortic valve.  Although LV systolic function was reported as normal, the final echocardiogram report was very incomplete and did not  include any measurements of aortic valve function or transvalvular gradients. She would need to be formally evaluated by a cardiologist, possibly have repeat echocardiogram and likely undergo stress testing and/or diagnostic cardiac catheterization if major surgical intervention were to be contemplated for management of the patient's large substernal goiter.    The history regarding management of the patient's goiter remains a bit unclear.  The presence of the very large substernal goiter and chronic right pleural effusion has been known for several years and documented on serial CT scans obtained beginning on 12/23/2012.  Despite all this the patient has not been on suppressive thyroid hormone therapy and it is unclear whether or not she has ever been formally evaluated by an endocrinologist.  The patient believes that she underwent radioactive iodine thyroid ablation several years ago, but she cannot  recall the name of the doctor who treated her at the time.  She does not recall ever being referred for surgical consultation because of her substernal goiter.  She has had hoarseness of her voice for most of her adult life, but she states that this has gotten considerably worse over the last few years. She has intermittently difficulty with swallowing and at times reports coughing after trying to eat or drink. She has unquestionably a high-risk for aspiration. She has poor appetite and has reportedly lost 60 pounds in weight over the past year.  Recently the patient has undergone repeat needle thoracentesis for recurrent right pleural effusion.  Repeated pleural fluid cytology has been consistent with reactive mesothelial cells and without evidence for malignancy.    I would be somewhat reluctant to consider this patient a candidate for surgical resection or debulking of her substernal goiter. The patient and her family remain interested in considering an aggressive approach. I will ask one of my partners with more experience in noncardiac thoracic surgery to evaluate the patient for a second opinion. The patient will need to be seen in consultation by cardiology for preoperative assessment. I have suggested to the patient's husband that it might be helpful to obtain records from the patient's primary care physician's office regarding the patient's previous treatment for her goiter including possible radioactive iodine therapy performed within the past 10 years.  All of their questions have been addressed.  I spent in excess of 120 minutes during the conduct of this hospital encounter and >50% of this time involved direct face-to-face encounter with the patient for counseling and/or coordination of their care.   Rexene Alberts 09/24/2014 12:53 PM

## 2014-09-24 NOTE — Progress Notes (Signed)
Alerted by central tele that pt has converted into a-fib. HR 120s-130s. Pt is resting. No new complaints. MD Rogue Bussing paged. Awaiting call back.   Dezmin Kittelson, RN

## 2014-09-24 NOTE — Progress Notes (Signed)
MD Rogue Bussing ordered Cardizem drip no bolus. Cardizem drip has been started at 5 mg/hr. Pt is resting, no complaints of pain. VSS. Will continue to monitor patient.

## 2014-09-24 NOTE — Progress Notes (Signed)
Pt had 7 beat run of Vtach. Pt asymptomatic and did not feel anything. VSS.   Will continue to monitor.   Alison Gross

## 2014-09-24 NOTE — Progress Notes (Signed)
Cardizem drip titrated to 10 mg/hr. HR fluctuating between 120s-140s. Patient resting comfortably. BP stable. No complaints of pain. Will continue to monitor.   Connell Bognar, RN

## 2014-09-24 NOTE — Progress Notes (Addendum)
[facility] PROGRESS NOTE  Alison Gross MPN:[id number] DOB: October 26, 1932 DOA: 09/19/2014 PCP: Maximino Greenland, MD  Brief Summary  Alison Gross is a 81 y.o. female with a hx of goiter, HLD, afib on chronic anticoagulation, chronic CHF, HTN, DM2 on tradjenta, breast cancer in remission, MGUS, and hx of recurrent R sided pleural effusions who presents to the [place] ED on 7/12 with worsening sob over the past several days. Pt is s/p thoracentesis 6/16 yielding 800cc.  In ED, chest imaging demonstrated moderate R sided effusion.  Of note, patient does have a known goiter. Family reported pt had been having more difficulty swallowing foods recently.  Essentially, she has had a chronic goiter, part of which was removed in the 1970s (external portion).  It is large, intrathoracic and was probably the cause of her chronic right effusion which she tolerated for many years.  She has had some marginal enlargement in the last few months which has led to dyspnea requiring thoracentesis, dysphonia, and dysphagia.  Ultimately, she needs treatment for her goiter or she will need palliative measures such as pleurex.  Options being explored are surgical removal, radiation.    STUDIES:  6/16 Rt pleural fluid >> 800 ml, protein 3.2, LDH 49, WBC 614 (84%L), atypical cells present on cytology but were finally reported as reactive mesothelial cells 7/11 PET scan >> moderate/large Rt pleural effusion, massive thyroid goiter 7/12 ADMIT 7/12 CT chest >> third spacing of fluid, Large Rt pleural effusion, very large thyroid, centrilobular emphysema 7/12 Rt thoracentesis with removal of 1200 mL 7/13 developed hypotension and transferred to ICU requiring IVF and vasopressors, Speech assessment >> stage 1 baby food 7/14 weaned off vasopressors 7/14 CT head/neck >> massive Rt thyroid, mod/large Rt effusion 7/14 Echo >> severe LVH, EF 55 to 00%, grade 2 diastolic dysfx, severe LA dilation, mod RA dilation, small/mod  pericardial effusion 7/15:  Right IJ TLC removed 7/16:  DVT in the RUE, transfer to [facility] for cardiothoracic surgery consultation in hospital, tapering hydrocortisone  Assessment/Plan   Goiter, large and compressing the right lung and likely contributing to her effusion, swallowing and speech difficulties.  See above.  TSH 1.115 on 6/15 and fT3 and fT4 wnl -  Patient was transferred from [facility] to [facility] on 7/16 for thoracic surgery opinion regarding goiter surgery. -  Consider pleurex catheter if no surgery > she will likely need before discharge -  F/u with XRT oncology whether they feel radiation might help if she is not a surgical candidate - Detailed thoracic consultation note by Dr. [name] on 7/17 appreciated: In summary, patient may not be a great surgical candidate. He will ask one of his partners for second opinion. Cardiology consultation for preop evaluation-concern for angina and/or worsening CHF.  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 The case was discussed with Dr. Wilburn Cornelia from ENT who will follow up as outpatient for evaluation -  Speech therapy  -  follow-up with ENT in clinic    Dysphagia, likely secondary to large goiter.  Got choked up on dysphagia 2 diet. -   Resume dysphagia 1 diet indefinitely for now  Hypotension, was likely  secondary to intravascular volume depletion/dehydration and relative adrenal insufficiency.  She was given IVF and started on stress dose steroids. -  Place stop date for steroids on Sunday morning  Acute DVT in RUE secondary to central line -  Central line removed on 7/15 -  Continue heparin gtt and plan to transition to  warfarin:  Because of acute DVT and renal disease, will not be able to resume a NOAC.  Elevated troponin, chest pain-free, EKG demonstrates no acute ST segment elevation or T-wave inversions. She did previously have some T-wave inversions in the lateral leads and some mild ST segment elevations in the anterior leads which are stable.  Stable elevation due to CKD and effusion. - Requested cardiology to evaluate regarding preop evaluation, possible angina earlier during hospitalization and worsening CHF-discussed with Dr. Einar Gip and patient will be seen on 7/18 by Dr. Doylene Canard.  Chronic diastolic heart failure, CVP 12  -  Will not resume lasix at this time secondary to poor oral intake and rising BUN - Start diuretics  Paroxysmal atrial fibrillation, continue heparin gtt for now pending possible pleur-x catheter placement or surgery -  Will need to transition to warfarin given kidney function - Patient had A. fib with RVR overnight 7/16 and was started on Cardizem drip and titrate up to 10 mg/h. She reverted to sinus rhythm. Remains in sinus rhythm. Will cut back to 5 mg/h and continue until cardiology evaluation 7/18.   Diabetes mellitus type 2 with well controlled blood sugars, A1c 6.2 last month -  Continue mod dose SSI  Right-sided breast cancer in remission, no evidence of recurrence on recent PET scan -  Continue arimidex   Hyperlipidemia, stable, continue atorvastatin   CKD stage IV,  Creatinine clearance less than 30 , creatinine 1.7 -   Minimize nephrotoxicity and renally dose medications - Creatinine seems to be at baseline.  Hypercalcemia secondary to dehydration and possible thyroid abnormalities (nonparathyroid hypercalcemia), resolved.  -  Vitamin D 54, PTHrp wnl, PTH < 10  Iron deficiency anemia superimposed on MGUS. - continue iron supplementation - B12, folate, and TSH wnl - Hemoglobin stable.  Moderate malnutrition -  Nutrition  -  supplements  Constipation  -   Continue miralax -  Bisacodyl suppository -  Lactulose 30gm once    Diet:  Dysphagia 1 Access:  PIV IVF:  off Proph:  Heparin gtt pending determination of procedure  Code Status: full  Family Communication: Discussed with patient's spouse, daughter and granddaughter at bedside on 7/17. Disposition Plan:   To be determined.   Consultants:  PCCM  IR  Thoracic surgery  Procedures:  Thoracentesis of right pleural effusion by IR on 09/19/2014  CT soft tissue neck pending  Foley catheter.  Right IJ-discontinued.   Antibiotics:  Unasyn 7/13 > 7/14  HPI/Subjective:  Overnight complained of some palpitations and dyspnea. Feels better this morning. Swelling of both upper extremities, right >left.    Objective: Filed Vitals:   09/24/14 0200 09/24/14 0204 09/24/14 0324 09/24/14 1511  BP: 144/74 131/70 117/51 113/45  Pulse: 130 114 89 87  Temp:   98.6 F (37 C) 97.7 F (36.5 C)  TempSrc:   Oral Oral  Resp:   18 17  Height:      Weight:   61.19 kg (134 lb 14.4 oz)   SpO2:   99% 100%    Intake/Output Summary (Last 24 hours) at 09/24/14 1516 Last data filed at 09/24/14 0926  Gross per 24 hour  Intake  120 ml  Output   1126 ml  Net  -1006 ml   Filed Weights   09/19/14 1300 09/20/14 0551 09/24/14 0324  Weight: 52.164 kg (115 lb) 51.302 kg (113 lb 1.6 oz) 61.19 kg (134 lb 14.4 oz)   Body mass index is 22.45 kg/(m^2).  Exam:   General:   Cachectic female,No acute distress, hoarse voice. Chronically ill-looking sitting comfortably propped up in bed without distress.  Cardiovascular:  S1 and S2 heard, RRR. No JVD, murmurs. 1+ pitting bilateral leg edema. Telemetry: Sinus rhythm. Had brief A. fib with RVR in the 140s overnight.  Respiratory:   Reduced breath sounds in the bases/bronchial breath sounds just above. Rest of lung fields clear to auscultation. No crackles, wheezing or rhonchi. No increased work of breathing.   Abdomen:   NABS, soft, NT/ND  MSK:    Normal tone and bulk, 1+ pitting edema of bilateral LE.  Tense edema 2+ of RUE-decreasing  Neuro:  Grossly intact. Alert and oriented 3. No focal deficits.  Data Reviewed: Basic Metabolic Panel:  Recent Labs Lab 09/21/14 0530 09/21/14 2320 09/22/14 0500 09/23/14 0515 09/24/14 0315  NA 140 138 141 140 140  K 4.5 4.2 4.2 4.5 4.1  CL 112* 113* 115* 117* 117*  CO2 25 21* 22 19* 17*  GLUCOSE 160* 144* 141* 88 118*  BUN 25* 33* 33* 37* 31*  CREATININE 1.83* 2.05* 1.88* 1.90* 1.64*  CALCIUM 9.5 8.5* 9.0 8.5* 8.8*   Liver Function Tests:  Recent Labs Lab 09/18/14 0946 09/19/14 0750 09/20/14 0355 09/20/14 1510  AST '27 26 21 23  '$ ALT '18 18 16 14  '$ ALKPHOS 78 70 63 63  BILITOT 0.68 0.8 0.8 0.7  PROT 6.8 7.0 6.1* 6.5  ALBUMIN 3.3* 3.4* 2.9* 3.0*   No results for input(s): LIPASE, AMYLASE in the last 168 hours. No results for input(s): AMMONIA in the last 168 hours. CBC:  Recent Labs Lab 09/18/14 0946 09/19/14 0750  09/20/14 1510 09/21/14 0530 09/22/14 0500 09/23/14 0630 09/24/14 0315  WBC 4.5 4.3  < > 4.6 3.5* 8.2 10.3 6.2  NEUTROABS 2.9 2.4  --   --   --   --   --   --   HGB 9.6* 9.1*  < > 8.7* 8.2* 7.9* 8.1* 9.1*  HCT 29.4* 28.2*  < > 28.0* 25.8* 25.0* 26.9* 29.3*  MCV 87.9 88.4  < > 89.2 88.7 90.3 93.7 88.5  PLT 319 328  < > 310 258 272 248 240  < > = values in this interval not displayed.  Recent Results (from the past 240 hour(s))  Body fluid culture     Status: None   Collection Time: 09/19/14  4:19 PM  Result Value Ref Range Status   Specimen Description PLEURAL RIGHT  Final   Special Requests Normal  Final   Gram Stain   Final    CYTOSPIN SLIDE WBC PRESENT,BOTH PMN AND MONONUCLEAR NO ORGANISMS SEEN    Culture   Final    NO GROWTH 3 DAYS Performed at [facility]    Report Status 09/22/2014 FINAL  Final  Culture, blood (routine x 2)     Status: None (Preliminary result)   Collection Time: 09/20/14  5:45 PM  Result Value Ref Range Status    Specimen Description BLOOD LEFT ARM  Final   Special Requests BOTTLES DRAWN AEROBIC AND ANAEROBIC 5CC  Final   Culture   Final    NO GROWTH 4 DAYS Performed at [facility]    Report Status PENDING  Incomplete  Culture, blood (routine x 2)     Status: None (Preliminary result)   Collection Time: 09/20/14  5:54 PM  Result Value Ref Range Status   Specimen Description BLOOD LEFT HAND  Final   Special Requests BOTTLES DRAWN AEROBIC ONLY 1.5CC  Final   Culture   Final    NO GROWTH 4 DAYS Performed at [facility]    Report Status PENDING  Incomplete  MRSA PCR Screening     Status: None   Collection Time: 09/20/14  6:45 PM  Result Value Ref Range Status   MRSA by PCR NEGATIVE NEGATIVE Final    Comment:        The GeneXpert MRSA Assay (FDA approved for NASAL specimens only), is one component of a comprehensive MRSA colonization surveillance program. It is not intended to diagnose MRSA infection nor to guide or monitor treatment for MRSA infections.      Studies: Dg Chest Port 1 View  09/24/2014   CLINICAL DATA:  Shortness of breath.  Pleural effusion.  EXAM: PORTABLE CHEST - 1 VIEW  COMPARISON:  Multiple prior studies including 09/22/2014  FINDINGS: Right IJ central line has been removed. Heart is enlarged. Bilateral pleural effusions again noted. There is right basilar opacity, stable in appearance and consistent with atelectasis or infiltrate. Left lower lung opacity obscures the hemidiaphragm and is consistent with atelectasis or infiltrate. Right mediastinal mass again noted and stable.  IMPRESSION: Persistent bilateral lower lobe opacities and pleural effusions.  Removal of right IJ central line.   Electronically Signed   By: Nolon Nations M.D.   On: 09/24/2014 08:48    Scheduled Meds: . albuterol  5 mg Nebulization Once  . anastrozole  1 mg Oral Q breakfast  . atorvastatin  10 mg Oral q1800  . feeding supplement  1 Container Oral TID BM  . feeding  supplement (ENSURE ENLIVE)  237 mL Oral BID BM  . ferrous sulfate  325 mg Oral TID WC  . insulin aspart  0-15 Units Subcutaneous TID WC  . insulin aspart  0-5 Units Subcutaneous QHS  . linagliptin  5 mg Oral Daily  . pantoprazole  40 mg Oral Daily  . polyethylene glycol  17 g Oral Daily  . sodium chloride  1,000 mL Intravenous Once   Continuous Infusions: . diltiazem (CARDIZEM) infusion 10 mg/hr (09/24/14 0930)  . heparin 950 Units/hr (09/23/14 1353)    Principal Problem:   Pleural effusion, right Active Problems:   HTN (hypertension)   Recurrent right pleural effusion   Stage III chronic kidney disease   Protein-calorie malnutrition, severe   Dyslipidemia   Type 2 diabetes mellitus with renal manifestations   Dyspnea   Hypotension   Dysphagia   Dysphonia   Pleural effusion on right   Substernal goiter   Vocal cord paralysis    Time spent:  40 minutes, including time spent coordinating transfer to [place], MD, FACP, [place]. [facility] Pager [phone]  If 7PM-7AM, please contact night-coverage [email or web address] Password [email or web address] 09/24/2014, 3:36 PM    LOS: 3 days

## 2014-09-24 NOTE — Consult Note (Signed)
Referring Physician:  SEMONE ORLOV is an 81 y.o. female.                       Chief Complaint: Shortness of breath  HPI: Alison Gross is a 81 yo African American Female with known history of Thyroid Goiter, Right Sided Breast Cancer in Remission, Hyperlipidemia, Atrial Fibrillation on chronic anticoagulation, HTN, DM, and Recurrent pleural effusions. The patient presented to [facility] Emergency Department on 09/19/2014 with complaints of worsening shortness of breath for the past several days.She also has large substernal goiter, pleural effusions requiring recurrent thoracentesis. Her echocardiogram showed good LV systolic function with severe concentric LVH, Quadricuspid AV without stenosis, dilated LA and RA and mild pericardial effusion. EKG showed sinus rhythm with frequent premature atrial complexes and Sinus arrhythmia and T wave abnormality. Patient denies chest pain but has shortness of breath with activity and at rest.  Past Medical History  Diagnosis Date  . Hypercholesterolemia     takes Crestor daily  . Thyroid disease     had iodine radiation  . Hypertension     takes Hyzaar daily  . Dysrhythmia     takes Carvedilol daily  . Pneumonia     history of;last time about 4-88yr ago  . GERD (gastroesophageal reflux disease)     takes Protonix daily  . Gastric ulcer   . History of blood transfusion   . History of colon polyps   . Anemia     takes iron pill daily  . Diabetes mellitus without complication     takes Tradjenta daily  . History of radiation therapy 07/12/12-08/26/12    right breast/  . Paroxysmal atrial fibrillation     PCP EKG 09/27/2013: A. Fibrillation.. EKG 09/29/2013: S. Tach.  . Breast cancer 04/12/12    right-pos lymph node/left-DCIS  . Shortness of breath on exertion   . Bruit of left carotid artery   . Acute upper GI bleeding 01/24/2012  . MGUS (monoclonal gammopathy of unknown significance) 04/21/2013  . Osteoporosis 11/29/2013  . Recurrent right  pleural effusion 07/26/2014  . Stage III chronic kidney disease 07/26/2014  . Goiter 07/27/2014  . Substernal goiter 12/23/2012  . Pleural effusion, right 12/23/2012  . Type 2 diabetes mellitus with renal manifestations 07/26/2014  . Dysphonia 09/20/2014  . Vocal cord paralysis     Suspected due to massive substernal goiter      Past Surgical History  Procedure Laterality Date  . Carpal tunnel release      left  . Breast biopsy    . Esophagogastroduodenoscopy  01/24/2012    Procedure: ESOPHAGOGASTRODUODENOSCOPY (EGD);  Surgeon: MJeryl Columbia MD;  [facility];  Service: Endoscopy;  Laterality: N/A;  . Thyroid goiter removed    . Cyst removed from back of neck    . Knee surgery      right with rods  . Esophagogastroduodenoscopy    . Colonoscopy    . Total mastectomy Bilateral 05/10/2012    Procedure: RIGHT modified mastectomy; LEFT total mastectomy;  Surgeon: CHaywood Lasso MD;  Location[facility]  Service: General;  Laterality: Bilateral;  . Axillary sentinel node biopsy Right 05/10/2012    Procedure: AXILLARY SENTINEL lymph  NODE BIOPSY;  Surgeon: CHaywood Lasso MD;  Location[facility]  Service: General;  Laterality: Right;  nuclear medicine injection right side  7:00 am   . Abdominal hysterectomy      fibroids, with bilateral SO    Family  History  Problem Relation Age of Onset  . Brain cancer Mother   . Aneurysm Father   . Diabetes Sister   . Diabetes Brother   . Diabetes Sister   . Diabetes Brother   . Heart failure Sister    Social History:  reports that she quit smoking about 43 years ago. Her smoking use included Cigarettes. She smoked 0.00 packs per day. She has never used smokeless tobacco. She reports that she does not drink alcohol or use illicit drugs.  Allergies: No Known Allergies  Medications Prior to Admission  Medication Sig Dispense Refill  . acetaminophen (TYLENOL) 500 MG tablet Take 500 mg by mouth every 6 (six) hours as needed for moderate  pain or headache.    . anastrozole (ARIMIDEX) 1 MG tablet TAKE 1 TABLET (1 MG TOTAL) BY MOUTH DAILY. (Patient taking differently: Take 1 mg by mouth daily with breakfast. ) 30 tablet 5  . atorvastatin (LIPITOR) 10 MG tablet Take 1 tablet (10 mg total) by mouth daily at 6 PM. 30 tablet 0  . feeding supplement, ENSURE ENLIVE, (ENSURE ENLIVE) LIQD Take 237 mLs by mouth 2 (two) times daily between meals. 237 mL 12  . ferrous sulfate 325 (65 FE) MG tablet Take 1 tablet (325 mg total) by mouth 3 (three) times daily with meals. 90 tablet 0  . furosemide (LASIX) 40 MG tablet Take 40 mg by mouth daily as needed for fluid or edema.   2  . linagliptin (TRADJENTA) 5 MG TABS tablet Take 5 mg by mouth daily.    Marland Kitchen losartan (COZAAR) 100 MG tablet Take 100 mg by mouth daily.  2  . Multiple Vitamin (MULTIVITAMIN WITH MINERALS) TABS Take 1 tablet by mouth every other day.     . Rivaroxaban (XARELTO) 15 MG TABS tablet Take 1 tablet (15 mg total) by mouth daily. 30 tablet 0  . levofloxacin (LEVAQUIN) 750 MG tablet Take 1 tablet (750 mg total) by mouth every other day. (Patient not taking: Reported on 09/19/2014) 2 tablet 0    Results for orders placed or performed during the hospital encounter of 09/19/14 (from the past 48 hour(s))  Glucose, capillary     Status: Abnormal   Collection Time: 09/22/14  5:49 PM  Result Value Ref Range   Glucose-Capillary 103 (H) 65 - 99 mg/dL   Comment 1 Notify RN    Comment 2 Document in Chart   Glucose, capillary     Status: Abnormal   Collection Time: 09/22/14  9:09 PM  Result Value Ref Range   Glucose-Capillary 122 (H) 65 - 99 mg/dL  Basic metabolic panel     Status: Abnormal   Collection Time: 09/23/14  5:15 AM  Result Value Ref Range   Sodium 140 135 - 145 mmol/L   Potassium 4.5 3.5 - 5.1 mmol/L   Chloride 117 (H) 101 - 111 mmol/L   CO2 19 (L) 22 - 32 mmol/L   Glucose, Bld 88 65 - 99 mg/dL   BUN 37 (H) 6 - 20 mg/dL   Creatinine, Ser 1.90 (H) 0.44 - 1.00 mg/dL    Calcium 8.5 (L) 8.9 - 10.3 mg/dL   GFR calc non Af Amer 24 (L) >60 mL/min   GFR calc Af Amer 28 (L) >60 mL/min    Comment: (NOTE) The eGFR has been calculated using the CKD EPI equation. This calculation has not been validated in all clinical situations. eGFR's persistently <60 mL/min signify possible Chronic Kidney Disease.    Anion  gap 4 (L) 5 - 15  Heparin level (unfractionated)     Status: None   Collection Time: 09/23/14  5:25 AM  Result Value Ref Range   Heparin Unfractionated 0.48 0.30 - 0.70 IU/mL    Comment:        IF HEPARIN RESULTS ARE BELOW EXPECTED VALUES, AND PATIENT DOSAGE HAS BEEN CONFIRMED, SUGGEST FOLLOW UP TESTING OF ANTITHROMBIN III LEVELS.   CBC     Status: Abnormal   Collection Time: 09/23/14  6:30 AM  Result Value Ref Range   WBC 10.3 4.0 - 10.5 K/uL   RBC 2.87 (L) 3.87 - 5.11 MIL/uL   Hemoglobin 8.1 (L) 12.0 - 15.0 g/dL   HCT 26.9 (L) 36.0 - 46.0 %   MCV 93.7 78.0 - 100.0 fL   MCH 28.2 26.0 - 34.0 pg   MCHC 30.1 30.0 - 36.0 g/dL   RDW 16.7 (H) 11.5 - 15.5 %   Platelets 248 150 - 400 K/uL  Glucose, capillary     Status: None   Collection Time: 09/23/14  7:30 AM  Result Value Ref Range   Glucose-Capillary 74 65 - 99 mg/dL  Glucose, capillary     Status: None   Collection Time: 09/23/14 11:51 AM  Result Value Ref Range   Glucose-Capillary 78 65 - 99 mg/dL  Glucose, capillary     Status: None   Collection Time: 09/23/14  9:02 PM  Result Value Ref Range   Glucose-Capillary 78 65 - 99 mg/dL  Basic metabolic panel     Status: Abnormal   Collection Time: 09/24/14  3:15 AM  Result Value Ref Range   Sodium 140 135 - 145 mmol/L   Potassium 4.1 3.5 - 5.1 mmol/L   Chloride 117 (H) 101 - 111 mmol/L   CO2 17 (L) 22 - 32 mmol/L   Glucose, Bld 118 (H) 65 - 99 mg/dL   BUN 31 (H) 6 - 20 mg/dL   Creatinine, Ser 1.64 (H) 0.44 - 1.00 mg/dL   Calcium 8.8 (L) 8.9 - 10.3 mg/dL   GFR calc non Af Amer 29 (L) >60 mL/min   GFR calc Af Amer 33 (L) >60 mL/min     Comment: (NOTE) The eGFR has been calculated using the CKD EPI equation. This calculation has not been validated in all clinical situations. eGFR's persistently <60 mL/min signify possible Chronic Kidney Disease.    Anion gap 6 5 - 15  CBC     Status: Abnormal   Collection Time: 09/24/14  3:15 AM  Result Value Ref Range   WBC 6.2 4.0 - 10.5 K/uL   RBC 3.31 (L) 3.87 - 5.11 MIL/uL   Hemoglobin 9.1 (L) 12.0 - 15.0 g/dL   HCT 29.3 (L) 36.0 - 46.0 %   MCV 88.5 78.0 - 100.0 fL   MCH 27.5 26.0 - 34.0 pg   MCHC 31.1 30.0 - 36.0 g/dL   RDW 16.5 (H) 11.5 - 15.5 %   Platelets 240 150 - 400 K/uL  Heparin level (unfractionated)     Status: None   Collection Time: 09/24/14  3:15 AM  Result Value Ref Range   Heparin Unfractionated 0.64 0.30 - 0.70 IU/mL    Comment:        IF HEPARIN RESULTS ARE BELOW EXPECTED VALUES, AND PATIENT DOSAGE HAS BEEN CONFIRMED, SUGGEST FOLLOW UP TESTING OF ANTITHROMBIN III LEVELS.   Glucose, capillary     Status: Abnormal   Collection Time: 09/24/14  5:42 AM  Result Value Ref  Range   Glucose-Capillary 115 (H) 65 - 99 mg/dL   Dg Chest Port 1 View  09/24/2014   CLINICAL DATA:  Shortness of breath.  Pleural effusion.  EXAM: PORTABLE CHEST - 1 VIEW  COMPARISON:  Multiple prior studies including 09/22/2014  FINDINGS: Right IJ central line has been removed. Heart is enlarged. Bilateral pleural effusions again noted. There is right basilar opacity, stable in appearance and consistent with atelectasis or infiltrate. Left lower lung opacity obscures the hemidiaphragm and is consistent with atelectasis or infiltrate. Right mediastinal mass again noted and stable.  IMPRESSION: Persistent bilateral lower lobe opacities and pleural effusions.  Removal of right IJ central line.   Electronically Signed   By: Nolon Nations M.D.   On: 09/24/2014 08:48    Review Of Systems Constitutional: Negative for fever, chills and malaise/fatigue.  HENT: Negative for ear pain and  tinnitus.  Eyes: Negative for pain and discharge.  Respiratory: Positive for shortness of breath. Negative for hemoptysis, wheezing and stridor.  Cardiovascular: Negative for orthopnea and claudication.  Gastrointestinal: Negative for nausea and vomiting.  Genitourinary: Negative for frequency and hematuria.  Musculoskeletal: Negative for back pain and neck pain.  Neurological: Negative for tremors, seizures and loss of consciousness.  Psychiatric/Behavioral: Negative for memory loss and substance abuse.    Blood pressure 113/45, pulse 87, temperature 97.7 F (36.5 C), temperature source Oral, resp. rate 17, height 5' 5"  (1.651 m), weight 61.19 kg (134 lb 14.4 oz), SpO2 100 %. General appearance: alert, cooperative and no distress Head: Normocephalic, without obvious abnormality, atraumatic. Brown eyes, conj-pale, Sclera-non-icteric Neck: no adenopathy, no carotid bruit, no JVD, supple, symmetrical, trachea midline and thyroid: enlarged. Scar anteriorly at base of neck. Resp: diminished breath sounds right base. No wheezing. Cardio: irregularly irregular rhythm. Grade IV/VI systolic and II/VI diastolic murmur. GI: soft, non-tender; bowel sounds normal; no masses, no organomegaly Extremities: edema trace, no cyanosis or clubbing. Neurologic: Grossly normal. Moves all 4 extremities.  Assessment/Plan Diastolic heart failure, acute on chronic Paroxysmal Atrial fibrillation Right breast cancer in remission Hyperlipidemia Hypertension DM, II Recurrent pleural effusions Large substernal thyroid goiter Hypertrophic cardiomyopathy with mild LV outflow obstruction Quadricuspid AV without stenosis Dilated LA and RA Small Pericardial effusion Iron deficiency anemia Emphysema Hypoalbuminemia  Will schedule nuclear stress test in AM. She will not tolerate aggressive diuresis or inotropic agents. Change IV diltiazem to PO.   Birdie Riddle, MD  09/24/2014, 5:11 PM

## 2014-09-24 NOTE — Progress Notes (Signed)
EKG is being taken.   Deja Pisarski, RN

## 2014-09-24 NOTE — Progress Notes (Signed)
Pt converted back to NSR. HR 88. VSS. No new complaints. EKG taken. Patient is resting comfortably. MD Rogue Bussing notified. Will continue to monitor.   Zakkery Dorian, RN

## 2014-09-25 ENCOUNTER — Inpatient Hospital Stay (HOSPITAL_COMMUNITY): Payer: Commercial Managed Care - HMO

## 2014-09-25 DIAGNOSIS — I472 Ventricular tachycardia: Secondary | ICD-10-CM

## 2014-09-25 LAB — BASIC METABOLIC PANEL
ANION GAP: 7 (ref 5–15)
BUN: 29 mg/dL — ABNORMAL HIGH (ref 6–20)
CO2: 20 mmol/L — ABNORMAL LOW (ref 22–32)
CREATININE: 1.52 mg/dL — AB (ref 0.44–1.00)
Calcium: 8.8 mg/dL — ABNORMAL LOW (ref 8.9–10.3)
Chloride: 115 mmol/L — ABNORMAL HIGH (ref 101–111)
GFR calc non Af Amer: 31 mL/min — ABNORMAL LOW (ref >60)
GFR, EST AFRICAN AMERICAN: 36 mL/min — AB (ref >60)
Glucose, Bld: 129 mg/dL — ABNORMAL HIGH (ref 65–99)
Potassium: 3.7 mmol/L (ref 3.5–5.1)
Sodium: 142 mmol/L (ref 135–145)

## 2014-09-25 LAB — CULTURE, BLOOD (ROUTINE X 2)
Culture: NO GROWTH
Culture: NO GROWTH

## 2014-09-25 LAB — GLUCOSE, CAPILLARY
GLUCOSE-CAPILLARY: 106 mg/dL — AB (ref 65–99)
Glucose-Capillary: 121 mg/dL — ABNORMAL HIGH (ref 65–99)
Glucose-Capillary: 72 mg/dL (ref 65–99)
Glucose-Capillary: 123 mg/dL — ABNORMAL HIGH (ref 65–99)

## 2014-09-25 LAB — MAGNESIUM: Magnesium: 1.7 mg/dL (ref 1.7–2.4)

## 2014-09-25 LAB — CBC
HCT: 24.9 % — ABNORMAL LOW (ref 36.0–46.0)
Hemoglobin: 8 g/dL — ABNORMAL LOW (ref 12.0–15.0)
MCH: 28.1 pg (ref 26.0–34.0)
MCHC: 32.1 g/dL (ref 30.0–36.0)
MCV: 87.4 fL (ref 78.0–100.0)
Platelets: 247 10*3/uL (ref 150–400)
RBC: 2.85 MIL/uL — ABNORMAL LOW (ref 3.87–5.11)
RDW: 16.5 % — ABNORMAL HIGH (ref 11.5–15.5)
WBC: 6.4 10*3/uL (ref 4.0–10.5)

## 2014-09-25 LAB — HEPARIN LEVEL (UNFRACTIONATED): Heparin Unfractionated: 0.45 IU/mL (ref 0.30–0.70)

## 2014-09-25 MED ORDER — TECHNETIUM TC 99M SESTAMIBI GENERIC - CARDIOLITE
10.0000 | Freq: Once | INTRAVENOUS | Status: AC | PRN
  Administered 2014-09-25: 10 via INTRAVENOUS

## 2014-09-25 MED ORDER — TECHNETIUM TC 99M SESTAMIBI GENERIC - CARDIOLITE
30.0000 | Freq: Once | INTRAVENOUS | Status: AC | PRN
  Administered 2014-09-25: 30 via INTRAVENOUS

## 2014-09-25 MED ORDER — REGADENOSON 0.4 MG/5ML IV SOLN
INTRAVENOUS | Status: AC
  Administered 2014-09-25: 0.4 mg via INTRAVENOUS
  Filled 2014-09-25: qty 5

## 2014-09-25 MED ORDER — FUROSEMIDE 10 MG/ML IJ SOLN
40.0000 mg | Freq: Once | INTRAMUSCULAR | Status: AC
  Administered 2014-09-25: 40 mg via INTRAVENOUS

## 2014-09-25 MED ORDER — REGADENOSON 0.4 MG/5ML IV SOLN
0.4000 mg | Freq: Once | INTRAVENOUS | Status: AC
  Administered 2014-09-25: 0.4 mg via INTRAVENOUS
  Filled 2014-09-25: qty 5

## 2014-09-25 MED ORDER — DILTIAZEM HCL 60 MG PO TABS
60.0000 mg | ORAL_TABLET | Freq: Four times a day (QID) | ORAL | Status: DC
  Administered 2014-09-25 – 2014-09-27 (×7): 60 mg via ORAL
  Filled 2014-09-25 (×17): qty 1

## 2014-09-25 MED ORDER — POTASSIUM CHLORIDE CRYS ER 20 MEQ PO TBCR
20.0000 meq | EXTENDED_RELEASE_TABLET | Freq: Once | ORAL | Status: AC
  Administered 2014-09-25: 20 meq via ORAL
  Filled 2014-09-25: qty 1

## 2014-09-25 NOTE — Consult Note (Signed)
Ref: Maximino Greenland, MD   Subjective:  Passed nuclear stress test. 62 % EF and small fixed defect at inferior lateral wall.  Objective:  Vital Signs in the last 24 hours: Temp:  [98.1 F (36.7 C)-98.8 F (37.1 C)] 98.8 F (37.1 C) (07/18 1410) Pulse Rate:  [68-114] 68 (07/18 1410) Cardiac Rhythm:  [-] Normal sinus rhythm (07/18 0056) Resp:  [18] 18 (07/18 0442) BP: (105-159)/(44-65) 124/63 mmHg (07/18 1410) SpO2:  [100 %] 100 % (07/18 0442) Weight:  [63.4 kg (139 lb 12.4 oz)] 63.4 kg (139 lb 12.4 oz) (07/18 0442)  Physical Exam: BP Readings from Last 1 Encounters:  09/25/14 124/63    Wt Readings from Last 1 Encounters:  09/25/14 63.4 kg (139 lb 12.4 oz)    Weight change: 2.21 kg (4 lb 14 oz)  HEENT: [place]/AT, Eyes-Brown, PERL, EOMI, Conjunctiva-Pale, Sclera-Non-icteric Neck: No JVD, No bruit, Trachea midline. Lungs:  Decreased breath sounds at bases otherwise clear, Bilateral. Cardiac:  Irregular rhythm, normal S1 and S2, no S3. III/VI systolic and II/VI diastolic murmur. Abdomen:  Soft, non-tender. Extremities:  No edema present. No cyanosis. No clubbing. CNS: AxOx3, Cranial nerves grossly intact, moves all 4 extremities.  Skin: Warm and dry.   Intake/Output from previous day: 07/17 0701 - 07/18 0700 In: 849.5 [P.O.:600; I.V.:199.5; IV Piggyback:50] Out: 325 [Urine:325]    Lab Results: BMET    Component Value Date/Time   NA 142 09/25/2014 0400   NA 140 09/24/2014 0315   NA 140 09/23/2014 0515   NA 143 09/18/2014 0946   NA 142 08/04/2014 1344   NA 141 07/11/2014 1102   K 3.7 09/25/2014 0400   K 4.1 09/24/2014 0315   K 4.5 09/23/2014 0515   K 4.0 09/18/2014 0946   K 4.4 08/04/2014 1344   K 4.1 07/11/2014 1102   CL 115* 09/25/2014 0400   CL 117* 09/24/2014 0315   CL 117* 09/23/2014 0515   CL 107 08/05/2012 1009   CL 108* 06/03/2012 1553   CL 105 04/21/2012 0806   CO2 20* 09/25/2014 0400   CO2 17* 09/24/2014 0315   CO2 19* 09/23/2014 0515   CO2 30*  09/18/2014 0946   CO2 26 08/04/2014 1344   CO2 28 07/11/2014 1102   GLUCOSE 129* 09/25/2014 0400   GLUCOSE 118* 09/24/2014 0315   GLUCOSE 88 09/23/2014 0515   GLUCOSE 94 09/18/2014 0946   GLUCOSE 108 08/04/2014 1344   GLUCOSE 119 07/11/2014 1102   GLUCOSE 128* 08/05/2012 1009   GLUCOSE 85 06/03/2012 1553   GLUCOSE 152* 04/21/2012 0806   BUN 29* 09/25/2014 0400   BUN 31* 09/24/2014 0315   BUN 37* 09/23/2014 0515   BUN 31.2* 09/18/2014 0946   BUN 45.5* 08/04/2014 1344   BUN 27.8* 07/11/2014 1102   CREATININE 1.52* 09/25/2014 0400   CREATININE 1.64* 09/24/2014 0315   CREATININE 1.90* 09/23/2014 0515   CREATININE 1.7* 09/18/2014 0946   CREATININE 1.8* 08/04/2014 1344   CREATININE 1.4* 07/11/2014 1102   CALCIUM 8.8* 09/25/2014 0400   CALCIUM 8.8* 09/24/2014 0315   CALCIUM 8.5* 09/23/2014 0515   CALCIUM 11.8* 09/18/2014 0946   CALCIUM 11.6* 08/04/2014 1344   CALCIUM 10.7* 07/27/2014 0830   CALCIUM 11.3* 07/11/2014 1102   GFRNONAA 31* 09/25/2014 0400   GFRNONAA 29* 09/24/2014 0315   GFRNONAA 24* 09/23/2014 0515   GFRAA 36* 09/25/2014 0400   GFRAA 33* 09/24/2014 0315   GFRAA 28* 09/23/2014 0515   CBC    Component Value Date/Time  WBC 6.4 09/25/2014 0400   WBC 4.5 09/18/2014 0946   RBC 2.85* 09/25/2014 0400   RBC 3.34* 09/18/2014 0946   RBC 2.80* 01/24/2012 1011   HGB 8.0* 09/25/2014 0400   HGB 9.6* 09/18/2014 0946   HCT 24.9* 09/25/2014 0400   HCT 29.4* 09/18/2014 0946   PLT 247 09/25/2014 0400   PLT 319 09/18/2014 0946   MCV 87.4 09/25/2014 0400   MCV 87.9 09/18/2014 0946   MCH 28.1 09/25/2014 0400   MCH 28.7 09/18/2014 0946   MCHC 32.1 09/25/2014 0400   MCHC 32.7 09/18/2014 0946   RDW 16.5* 09/25/2014 0400   RDW 16.8* 09/18/2014 0946   LYMPHSABS 0.6* 09/19/2014 0750   LYMPHSABS 0.6* 09/18/2014 0946   MONOABS 0.8 09/19/2014 0750   MONOABS 0.6 09/18/2014 0946   EOSABS 0.5 09/19/2014 0750   EOSABS 0.3 09/18/2014 0946   BASOSABS 0.1 09/19/2014 0750   BASOSABS  0.1 09/18/2014 0946   HEPATIC Function Panel  Recent Labs  09/19/14 0750 09/20/14 0355 09/20/14 1510  PROT 7.0 6.1* 6.5   HEMOGLOBIN A1C No components found for: HGA1C,  MPG CARDIAC ENZYMES Lab Results  Component Value Date   TROPONINI 0.29* 09/21/2014   TROPONINI 0.30* 09/21/2014   TROPONINI 0.33* 09/20/2014   BNP No results for input(s): PROBNP in the last 8760 hours. TSH  Recent Labs  07/27/14 0515 08/23/14 0822  TSH 1.036 1.115   CHOLESTEROL No results for input(s): CHOL in the last 8760 hours.  Scheduled Meds: . anastrozole  1 mg Oral Q breakfast  . atorvastatin  10 mg Oral q1800  . diltiazem  30 mg Oral 4 times per day  . feeding supplement  1 Container Oral TID BM  . feeding supplement (ENSURE ENLIVE)  237 mL Oral BID BM  . ferrous sulfate  325 mg Oral TID WC  . insulin aspart  0-15 Units Subcutaneous TID WC  . insulin aspart  0-5 Units Subcutaneous QHS  . linagliptin  5 mg Oral Daily  . pantoprazole  40 mg Oral Daily  . polyethylene glycol  17 g Oral Daily  . potassium chloride  20 mEq Oral Once  . sodium chloride  1,000 mL Intravenous Once   Continuous Infusions: . heparin 950 Units/hr (09/25/14 1012)   PRN Meds:.acetaminophen **OR** [DISCONTINUED] acetaminophen, albuterol, bisacodyl, morphine injection, [DISCONTINUED] ondansetron **OR** ondansetron (ZOFRAN) IV  Assessment/Plan: Diastolic heart failure, acute on chronic Paroxysmal Atrial fibrillation Right breast cancer in remission Hyperlipidemia Hypertension DM, II Recurrent pleural effusions Large substernal thyroid goiter Hypertrophic cardiomyopathy with mild LV outflow obstruction Quadricuspid AV without stenosis Dilated LA and RA Small Pericardial effusion Iron deficiency anemia Emphysema Hypoalbuminemia  May undergo thoracic surgery as needed. Increase diltiazem dose.    LOS: 4 days    Dixie Dials  MD  09/25/2014, 4:47 PM

## 2014-09-25 NOTE — Plan of Care (Signed)
Problem: Phase I Progression Outcomes Goal: Other Phase I Outcomes/Goals Outcome: Progressing Pt's appetite continues to be very poor. She eats less than 25% of her meals and takes 50% or less of her supplements. She continues on dysphagia 1 diet and tolerates it well.

## 2014-09-25 NOTE — Progress Notes (Signed)
ANTICOAGULATION CONSULT NOTE - Follow Up Consult  Pharmacy Consult for Heparin Indication: atrial fibrillation  No Known Allergies  Patient Measurements: Height: '5\' 5"'$  (165.1 cm) Weight: 139 lb 12.4 oz (63.4 kg) IBW/kg (Calculated) : 57 Heparin Dosing Weight: *63 kg  Vital Signs: Temp: 98.8 F (37.1 C) (07/18 1410) Temp Source: Oral (07/18 1410) BP: 124/63 mmHg (07/18 1410) Pulse Rate: 68 (07/18 1410)  Labs:  Recent Labs  09/23/14 0515 09/23/14 0525  09/23/14 0630 09/24/14 0315 09/25/14 0400  HGB  --   --   < > 8.1* 9.1* 8.0*  HCT  --   --   --  26.9* 29.3* 24.9*  PLT  --   --   --  248 240 247  HEPARINUNFRC  --  0.48  --   --  0.64 0.45  CREATININE 1.90*  --   --   --  1.64* 1.52*  < > = values in this interval not displayed.  Estimated Creatinine Clearance: 27 mL/min (by C-G formula based on Cr of 1.52).  Assessment:   Heparin level remains therapeutic (0.45) on 950 units/hr.   CBC trended down some. No bleeding noted.   Was on Xarelto for afib prior to admission; held for thoracentesis. New RUE DVT on 09/23/14.  Planning Coumadin when able to transition to PO due to current renal function.   Last INR 1.62 on 7/13, but last Xarelto dose 7/11, so value invalid.  Goal of Therapy:  Heparin level 0.3-0.7 units/ml Monitor platelets by anticoagulation protocol: Yes   Plan:   Continue heparin drip at 950 units/hr  Daily heparin level and CBC.  PT/INR in am for baseline.   Will follow up plans for Coumadin when no further procedures planned.  Arty Baumgartner, [place] Pager: [phone] 09/25/2014,3:43 PM

## 2014-09-25 NOTE — Progress Notes (Signed)
[facility] PROGRESS NOTE  Alison Gross NFA:[id number] DOB: 03-13-1932 DOA: 09/19/2014 PCP: Maximino Greenland, MD  Brief Summary  Alison Gross is a 81 y.o. female with a hx of goiter, HLD, afib on chronic anticoagulation, chronic CHF, HTN, DM2 on tradjenta, breast cancer in remission, MGUS, and hx of recurrent R sided pleural effusions who presents to the [facility] ED on 7/12 with worsening sob over the past several days. Pt is s/p thoracentesis 6/16 yielding 800cc.  In ED, chest imaging demonstrated moderate R sided effusion.  Of note, patient does have a known goiter. Family reported pt had been having more difficulty swallowing foods recently.  Essentially, she has had a chronic goiter, part of which was removed in the 1970s (external portion).  It is large, intrathoracic and was probably the cause of her chronic right effusion which she tolerated for many years.  She has had some marginal enlargement in the last few months which has led to dyspnea requiring thoracentesis, dysphonia, and dysphagia.  Ultimately, she needs treatment for her goiter or she will need palliative measures such as pleurex.  Options being explored are surgical removal, radiation.    STUDIES:  6/16 Rt pleural fluid >> 800 ml, protein 3.2, LDH 49, WBC 614 (84%L), atypical cells present on cytology but were finally reported as reactive mesothelial cells 7/11 PET scan >> moderate/large Rt pleural effusion, massive thyroid goiter 7/12 ADMIT 7/12 CT chest >> third spacing of fluid, Large Rt pleural effusion, very large thyroid, centrilobular emphysema 7/12 Rt thoracentesis with removal of 1200 mL 7/13 developed hypotension and transferred to ICU requiring IVF and vasopressors, Speech assessment >> stage 1 baby food 7/14 weaned off vasopressors 7/14 CT head/neck >> massive Rt thyroid, mod/large Rt effusion 7/14 Echo >> severe LVH, EF 55 to 46%, grade 2 diastolic dysfx, severe LA dilation, mod RA dilation, small/mod  pericardial effusion 7/15:  Right IJ TLC removed 7/16:  DVT in the RUE, transfer to [facility] for cardiothoracic surgery consultation in hospital, tapering hydrocortisone  Assessment/Plan   Goiter, large and compressing the right lung and likely contributing to her effusion, swallowing and speech difficulties.  See above.  TSH 1.115 on 6/15 and fT3 and fT4 wnl -  Patient was transferred from [facility] to [facility] on 7/16 for thoracic surgery opinion regarding goiter surgery. -  Consider pleurex catheter if no surgery > she will likely need before discharge -  F/u with XRT oncology whether they feel radiation might help if she is not a surgical candidate - Detailed thoracic consultation note by Dr. [name] on 7/17 appreciated: In summary, patient may not be a great surgical candidate. He will ask one of his partners for second opinion. Cardiology consultation for preop evaluation-concern for angina and/or worsening CHF.  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 The case was discussed with Dr. Wilburn Cornelia from ENT who will follow up as outpatient for evaluation -  Speech therapy  -  follow-up with ENT in clinic    Dysphagia, likely secondary to large goiter.  Got choked up on dysphagia 2 diet. -   Resume dysphagia 1 diet indefinitely for now  Hypotension, was likely  secondary to intravascular volume depletion/dehydration and relative adrenal insufficiency.  She was given IVF and started on stress dose steroids. -  Place stop date for steroids on Sunday morning - resolved  Acute DVT in RUE secondary to central line -  Central line removed on 7/15 -  Continue heparin gtt and plan to  transition to warfarin:  Because of acute DVT and renal disease, will not be able to resume a NOAC.  Elevated troponin, chest pain-free, EKG demonstrates no acute ST segment elevation or T-wave inversions. She did previously have some T-wave inversions in the lateral leads and some mild ST segment elevations in the anterior leads which are stable.  Stable elevation due to CKD and effusion. - Requested cardiology to evaluate regarding preop evaluation, possible angina earlier during hospitalization and worsening CHF-discussed with Dr. Einar Gip and patient will be seen on 7/18 by Dr. Doylene Canard. - Dr. Merrilee Jansky input appreciated: undergoing stress test today  Acute on Chronic diastolic heart failure - Significantly volume overloaded. Started IV Lasix 7/17-dosed daily based on BMP - Intake and output not reliable. +11 L since admission. Patient has Foleys. Strict intake and output.  Paroxysmal atrial fibrillation, continue heparin gtt for now pending possible pleur-x catheter placement or surgery -  Will need to transition to warfarin given kidney function - Patient had A. fib with RVR overnight 7/16 and was started on Cardizem drip and titrate up to 10 mg/h. She reverted to sinus rhythm. Remains in sinus rhythm. Cardiology has changed to oral Cardizem 7/16.   Diabetes mellitus type 2 with well controlled blood sugars, A1c 6.2 last month -  Continue mod dose SSI  Right-sided breast cancer in remission, no evidence of recurrence on recent PET scan -  Continue arimidex   Hyperlipidemia, stable, continue atorvastatin   CKD stage IV,  Creatinine clearance less than 30 , creatinine 1.7 -   Minimize nephrotoxicity and renally dose medications - Creatinine gradually improving over the last 5 days.  Hypercalcemia secondary to dehydration and possible thyroid abnormalities (nonparathyroid hypercalcemia), resolved.  -  Vitamin D 54, PTHrp wnl, PTH < 10  Iron deficiency anemia superimposed on MGUS. -  continue iron supplementation - B12, folate, and TSH wnl - Hemoglobin stable.  Moderate malnutrition -  Nutrition  -  supplements  Constipation  -  Continue miralax -  Bisacodyl suppository -  Lactulose 30gm once  NSVT 1 on 7/17 - Continue monitoring on telemetry. Try to keep K >4 & magnesium >2.    Diet:  Dysphagia 1 Access:  PIV IVF:  off Proph:  Heparin gtt pending determination of procedure  Code Status: full  Family Communication: Discussed with patient's daughter at bedside on 7/17. Disposition Plan:   To be determined.   Consultants:  PCCM  IR  Thoracic surgery  Cardiology  Procedures:  Thoracentesis of right pleural effusion by IR on 09/19/2014  CT soft tissue neck pending  Foley catheter.  Right IJ-discontinued.   Antibiotics:  Unasyn 7/13 > 7/14  HPI/Subjective:  No new complaints. Did not complain of palpitations, chest pain or dyspnea.  Objective: Filed Vitals:   09/25/14 1207 09/25/14 1209 09/25/14 1211 09/25/14 1410  BP: 159/65 148/61 151/60 124/63  Pulse: 114 112 109 68  Temp:    98.8 F (37.1 C)  TempSrc:    Oral  Resp:      Height:      Weight:      SpO2:       respiratory rate: 18/m and oxygen  saturation 100%.  Intake/Output Summary (Last 24 hours) at 09/25/14 1509 Last data filed at 09/25/14 1200  Gross per 24 hour  Intake  333.5 ml  Output      0 ml  Net  333.5 ml   Filed Weights   09/20/14 0551 09/24/14 0324 09/25/14 0442  Weight: 51.302 kg (113 lb 1.6 oz) 61.19 kg (134 lb 14.4 oz) 63.4 kg (139 lb 12.4 oz)   Body mass index is 23.26 kg/(m^2).  Exam:   General:   Cachectic female,No acute distress, hoarse voice. Chronically ill-looking sitting comfortably propped up in bed without distress.  Cardiovascular:  S1 and S2 heard, RRR. No JVD, murmurs. 1+ pitting bilateral leg edema. Telemetry: Sinus rhythm. 7 beat NSVT at 8:30 PM on 7/17.  Respiratory:   Reduced breath sounds in the bases/bronchial breath  sounds just above. Rest of lung fields clear to auscultation. No crackles, wheezing or rhonchi. No increased work of breathing.   Abdomen:   NABS, soft, NT/ND  MSK:   Normal tone and bulk, 1+ pitting edema of bilateral LE.  Bilateral upper extremity edema right >left. Not tense edema.  Neuro:  Grossly intact. Alert and oriented 3. No focal deficits.  Data Reviewed: Basic Metabolic Panel:  Recent Labs Lab 09/21/14 2320 09/22/14 0500 09/23/14 0515 09/24/14 0315 09/25/14 0400  NA 138 141 140 140 142  K 4.2 4.2 4.5 4.1 3.7  CL 113* 115* 117* 117* 115*  CO2 21* 22 19* 17* 20*  GLUCOSE 144* 141* 88 118* 129*  BUN 33* 33* 37* 31* 29*  CREATININE 2.05* 1.88* 1.90* 1.64* 1.52*  CALCIUM 8.5* 9.0 8.5* 8.8* 8.8*   Liver Function Tests:  Recent Labs Lab 09/19/14 0750 09/20/14 0355 09/20/14 1510  AST '26 21 23  '$ ALT '18 16 14  '$ ALKPHOS 70 63 63  BILITOT 0.8 0.8 0.7  PROT 7.0 6.1* 6.5  ALBUMIN 3.4* 2.9* 3.0*   No results for input(s): LIPASE, AMYLASE in the last 168 hours. No results for input(s): AMMONIA in the last 168 hours. CBC:  Recent Labs Lab 09/19/14 0750  09/21/14 0530 09/22/14 0500 09/23/14 0630 09/24/14 0315 09/25/14 0400  WBC 4.3  < > 3.5* 8.2 10.3 6.2 6.4  NEUTROABS 2.4  --   --   --   --   --   --   HGB 9.1*  < > 8.2* 7.9* 8.1* 9.1* 8.0*  HCT 28.2*  < > 25.8* 25.0* 26.9* 29.3* 24.9*  MCV 88.4  < > 88.7 90.3 93.7 88.5 87.4  PLT 328  < > 258 272 248 240 247  < > = values in this interval not displayed.  Recent Results (from the past 240 hour(s))  Body fluid culture     Status: None   Collection Time: 09/19/14  4:19 PM  Result Value Ref Range Status   Specimen Description PLEURAL RIGHT  Final   Special Requests Normal  Final   Gram Stain   Final    CYTOSPIN SLIDE WBC PRESENT,BOTH PMN AND MONONUCLEAR NO ORGANISMS SEEN    Culture   Final    NO GROWTH 3 DAYS Performed at [facility]    Report Status 09/22/2014 FINAL  Final  Culture, blood  (routine x 2)     Status: None   Collection Time: 09/20/14  5:45 PM  Result Value Ref Range Status   Specimen Description BLOOD LEFT ARM  Final   Special Requests BOTTLES DRAWN AEROBIC AND ANAEROBIC 5CC  Final  Culture   Final    NO GROWTH 5 DAYS Performed at [facility]    Report Status 09/25/2014 FINAL  Final  Culture, blood (routine x 2)     Status: None   Collection Time: 09/20/14  5:54 PM  Result Value Ref Range Status   Specimen Description BLOOD LEFT HAND  Final   Special Requests BOTTLES DRAWN AEROBIC ONLY 1.5CC  Final   Culture   Final    NO GROWTH 5 DAYS Performed at [facility]    Report Status 09/25/2014 FINAL  Final  MRSA PCR Screening     Status: None   Collection Time: 09/20/14  6:45 PM  Result Value Ref Range Status   MRSA by PCR NEGATIVE NEGATIVE Final    Comment:        The GeneXpert MRSA Assay (FDA approved for NASAL specimens only), is one component of a comprehensive MRSA colonization surveillance program. It is not intended to diagnose MRSA infection nor to guide or monitor treatment for MRSA infections.      Studies: Nm Myocar Multi W/spect W/wall Motion / Ef  09/25/2014   CLINICAL DATA:  81 year old female with a history of chest pain.  Cardiovascular risk factors include diabetes, hyperlipidemia.  EXAM: MYOCARDIAL IMAGING WITH SPECT (REST AND PHARMACOLOGIC-STRESS)  GATED LEFT VENTRICULAR WALL MOTION STUDY  LEFT VENTRICULAR EJECTION FRACTION  TECHNIQUE: Standard myocardial SPECT imaging was performed after resting intravenous injection of 10 mCi Tc-22msestamibi. Subsequently, intravenous infusion of Lexiscan was performed under the supervision of the Cardiology staff. At peak effect of the drug, 30 mCi Tc-925mestamibi was injected intravenously and standard myocardial SPECT imaging was performed. Quantitative gated imaging was also performed to evaluate left ventricular wall motion, and estimate left ventricular ejection fraction.   COMPARISON:  None.  FINDINGS: Perfusion: Small fixed defect at the inferior lateral wall on rest and perfusion images. No reversible ischemia identified.  Wall Motion: Normal left ventricular wall motion. No left ventricular dilation.  Left Ventricular Ejection Fraction: 62 %  End diastolic volume 71 ml  End systolic volume 27 ml  IMPRESSION: 1. Small fixed defect at the inferior lateral wall. No reversible ischemia identified.  2. Normal left ventricular wall motion.  3. Left ventricular ejection fraction 62%  4. Low-risk stress test findings*.  *2012 Appropriate Use Criteria for Coronary Revascularization Focused Update: J Am Coll Cardiol. 204854;62(7):035-009http://content.onairportbarriers.comspx?articleid=1201161   Electronically Signed   By: JaCorrie Mckusick.O.   On: 09/25/2014 14:24   Dg Chest Port 1 View  09/24/2014   CLINICAL DATA:  Shortness of breath.  Pleural effusion.  EXAM: PORTABLE CHEST - 1 VIEW  COMPARISON:  Multiple prior studies including 09/22/2014  FINDINGS: Right IJ central line has been removed. Heart is enlarged. Bilateral pleural effusions again noted. There is right basilar opacity, stable in appearance and consistent with atelectasis or infiltrate. Left lower lung opacity obscures the hemidiaphragm and is consistent with atelectasis or infiltrate. Right mediastinal mass again noted and stable.  IMPRESSION: Persistent bilateral lower lobe opacities and pleural effusions.  Removal of right IJ central line.   Electronically Signed   By: ElNolon Nations.D.   On: 09/24/2014 08:48    Scheduled Meds: . anastrozole  1 mg Oral Q breakfast  . atorvastatin  10 mg Oral q1800  . diltiazem  30 mg Oral 4 times per day  . feeding supplement  1 Container Oral TID BM  . feeding supplement (ENSURE ENLIVE)  237 mL Oral BID  BM  . ferrous sulfate  325 mg Oral TID WC  . insulin aspart  0-15 Units Subcutaneous TID WC  . insulin aspart  0-5 Units Subcutaneous QHS  . linagliptin  5 mg Oral Daily   . pantoprazole  40 mg Oral Daily  . polyethylene glycol  17 g Oral Daily  . sodium chloride  1,000 mL Intravenous Once   Continuous Infusions: . heparin 950 Units/hr (09/25/14 1012)    Principal Problem:   Pleural effusion, right Active Problems:   HTN (hypertension)   Recurrent right pleural effusion   Stage III chronic kidney disease   Protein-calorie malnutrition, severe   Dyslipidemia   Type 2 diabetes mellitus with renal manifestations   Dyspnea   Hypotension   Dysphagia   Dysphonia   Pleural effusion on right   Substernal goiter   Vocal cord paralysis    Time spent:  25 minutes.   Vernell Leep, MD, FACP, FHM. [facility] Pager [phone]  If 7PM-7AM, please contact night-coverage [email or web address] Password [email or web address] 09/25/2014, 3:09 PM    LOS: 4 days

## 2014-09-25 NOTE — Care Management Important Message (Signed)
Important Message  Patient Details  Name: Alison Gross MRN: [id number] Date of Birth: September 26, 1932   Medicare Important Message Given:  Yes-second notification given    Pricilla Handler 09/25/2014, 2:51 PM

## 2014-09-25 NOTE — Progress Notes (Signed)
Initial Nutrition Assessment  DOCUMENTATION CODES:   Not applicable  INTERVENTION:    Continue Ensure Enlive po BID, each supplement provides 350 kcal and 20 grams of protein   Continue Boost Breeze po TID, each supplement provides 250 kcal and 9 grams of protein  NUTRITION DIAGNOSIS:   Inadequate oral intake related to dysphagia as evidenced by meal completion < 50%  GOAL:   Patient will meet greater than or equal to 90% of their needs  MONITOR:   Diet advancement, PO intake, Labs, Weight trends, I & O's  REASON FOR ASSESSMENT:   Consult Assessment of nutrition requirement/status  ASSESSMENT:  81 y.o. Female with hx of goiter, HLD, afib on chronic anticoagulation, HTN, DM2 on tradjenta and hx of recurrent R sided pleural effusions who presents to the ED with subjectively worsening sob over the past several days. Pt is s/p thoracentesis 6/16 yielding 800cc of borderline exudative fluid, thought more likely transudative at that time. In ED, chest imaging demonstrated moderate R sided effusion. Pt remained without hypoxia or tachypnea. Hospitalist consulted for admission.   Patient s/p procedure 7/12: THORACENTESIS   Pt currently in [place].  Speech Path notes reviewed.  Prior to NPO status, pt on a Dys 1, thin liquid diet.  PO intake poor at 0-25% per flowsheet records.  Pt has active orders for Boost Breeze and Ensure Enlive supplements.  Weight readings below are highly variable.  RD unable to complete Nutrition Focused Physical Exam at this time.  Diet Order:  Diet NPO time specified  Skin:  Reviewed, no issues  Last BM:  7/17  Height:   Ht Readings from Last 1 Encounters:  09/19/14 '5\' 5"'$  (1.651 m)    Weight:   Wt Readings from Last 1 Encounters:  09/25/14 139 lb 12.4 oz (63.4 kg)    Ideal Body Weight:  56.8 kg  Wt Readings from Last 10 Encounters:  09/25/14 139 lb 12.4 oz (63.4 kg)  08/25/14 104 lb 11.2 oz (47.492 kg)  08/04/14 117 lb 3.2  oz (53.162 kg)  07/28/14 113 lb 14.4 oz (51.665 kg)  07/18/14 120 lb 4.8 oz (54.568 kg)  01/25/14 126 lb 3.2 oz (57.244 kg)  11/29/13 130 lb (58.968 kg)  10/25/13 131 lb 11.2 oz (59.739 kg)  10/12/13 132 lb (59.875 kg)  07/21/13 132 lb 8 oz (60.102 kg)    BMI:  Body mass index is 23.26 kg/(m^2).  Estimated Nutritional Needs:   Kcal:  1700-1900  Protein:  80-90 gm  Fluid:  1.7-1.9 L  EDUCATION NEEDS:   No education needs identified at this time  Arthur Holms, RD, LDN Pager #: [phone] After-Hours Pager #: [phone]

## 2014-09-25 NOTE — Progress Notes (Signed)
SLP Cancellation Note  Patient Details Name: Alison Gross MRN: [id number] DOB: 04-May-1932   Cancelled treatment:       Reason Eval/Treat Not Completed: Patient at procedure or test/unavailable.  SLP will follow up as able.  Gunnar Fusi, M.A., [place]  [place] 09/25/2014, 10:43 AM

## 2014-09-26 ENCOUNTER — Encounter: Payer: Self-pay | Admitting: *Deleted

## 2014-09-26 ENCOUNTER — Inpatient Hospital Stay (HOSPITAL_COMMUNITY): Payer: Commercial Managed Care - HMO

## 2014-09-26 ENCOUNTER — Encounter: Payer: Commercial Managed Care - HMO | Admitting: Thoracic Surgery (Cardiothoracic Vascular Surgery)

## 2014-09-26 ENCOUNTER — Ambulatory Visit (HOSPITAL_COMMUNITY): Payer: Commercial Managed Care - HMO

## 2014-09-26 DIAGNOSIS — I480 Paroxysmal atrial fibrillation: Secondary | ICD-10-CM | POA: Insufficient documentation

## 2014-09-26 DIAGNOSIS — J90 Pleural effusion, not elsewhere classified: Secondary | ICD-10-CM

## 2014-09-26 DIAGNOSIS — I5033 Acute on chronic diastolic (congestive) heart failure: Secondary | ICD-10-CM | POA: Insufficient documentation

## 2014-09-26 LAB — CBC
HCT: 27.6 % — ABNORMAL LOW (ref 36.0–46.0)
HCT: 28.9 % — ABNORMAL LOW (ref 36.0–46.0)
HEMOGLOBIN: 8.8 g/dL — AB (ref 12.0–15.0)
Hemoglobin: 9.4 g/dL — ABNORMAL LOW (ref 12.0–15.0)
MCH: 27.6 pg (ref 26.0–34.0)
MCH: 28.2 pg (ref 26.0–34.0)
MCHC: 31.9 g/dL (ref 30.0–36.0)
MCHC: 32.5 g/dL (ref 30.0–36.0)
MCV: 86.5 fL (ref 78.0–100.0)
MCV: 86.8 fL (ref 78.0–100.0)
Platelets: 256 10*3/uL (ref 150–400)
Platelets: 285 10*3/uL (ref 150–400)
RBC: 3.19 MIL/uL — AB (ref 3.87–5.11)
RBC: 3.33 MIL/uL — ABNORMAL LOW (ref 3.87–5.11)
RDW: 16.7 % — ABNORMAL HIGH (ref 11.5–15.5)
RDW: 16.8 % — ABNORMAL HIGH (ref 11.5–15.5)
WBC: 5.9 10*3/uL (ref 4.0–10.5)
WBC: 6 10*3/uL (ref 4.0–10.5)

## 2014-09-26 LAB — GLUCOSE, CAPILLARY
GLUCOSE-CAPILLARY: 104 mg/dL — AB (ref 65–99)
Glucose-Capillary: 112 mg/dL — ABNORMAL HIGH (ref 65–99)
Glucose-Capillary: 109 mg/dL — ABNORMAL HIGH (ref 65–99)
Glucose-Capillary: 106 mg/dL — ABNORMAL HIGH (ref 65–99)

## 2014-09-26 LAB — COMPREHENSIVE METABOLIC PANEL
ALT: 16 U/L (ref 14–54)
AST: 21 U/L (ref 15–41)
Albumin: 3 g/dL — ABNORMAL LOW (ref 3.5–5.0)
Alkaline Phosphatase: 74 U/L (ref 38–126)
Anion gap: 7 (ref 5–15)
BUN: 22 mg/dL — ABNORMAL HIGH (ref 6–20)
CO2: 24 mmol/L (ref 22–32)
Calcium: 9 mg/dL (ref 8.9–10.3)
Chloride: 108 mmol/L (ref 101–111)
Creatinine, Ser: 1.5 mg/dL — ABNORMAL HIGH (ref 0.44–1.00)
GFR calc Af Amer: 37 mL/min — ABNORMAL LOW (ref >60)
GFR calc non Af Amer: 32 mL/min — ABNORMAL LOW (ref >60)
Glucose, Bld: 120 mg/dL — ABNORMAL HIGH (ref 65–99)
Potassium: 3.7 mmol/L (ref 3.5–5.1)
Sodium: 139 mmol/L (ref 135–145)
Total Bilirubin: 0.7 mg/dL (ref 0.3–1.2)
Total Protein: 6 g/dL — ABNORMAL LOW (ref 6.5–8.1)

## 2014-09-26 LAB — BASIC METABOLIC PANEL
Anion gap: 6 (ref 5–15)
BUN: 23 mg/dL — ABNORMAL HIGH (ref 6–20)
CALCIUM: 9.1 mg/dL (ref 8.9–10.3)
CO2: 21 mmol/L — ABNORMAL LOW (ref 22–32)
Chloride: 112 mmol/L — ABNORMAL HIGH (ref 101–111)
Creatinine, Ser: 1.5 mg/dL — ABNORMAL HIGH (ref 0.44–1.00)
GFR calc Af Amer: 37 mL/min — ABNORMAL LOW (ref >60)
GFR calc non Af Amer: 32 mL/min — ABNORMAL LOW (ref >60)
GLUCOSE: 113 mg/dL — AB (ref 65–99)
Potassium: 3.9 mmol/L (ref 3.5–5.1)
SODIUM: 139 mmol/L (ref 135–145)

## 2014-09-26 LAB — PROTIME-INR
INR: 1.2 (ref 0.00–1.49)
Prothrombin Time: 15.4 seconds — ABNORMAL HIGH (ref 11.6–15.2)

## 2014-09-26 LAB — HEPARIN LEVEL (UNFRACTIONATED): Heparin Unfractionated: 0.41 IU/mL (ref 0.30–0.70)

## 2014-09-26 MED ORDER — FUROSEMIDE 10 MG/ML IJ SOLN
40.0000 mg | Freq: Once | INTRAMUSCULAR | Status: AC
  Administered 2014-09-26: 40 mg via INTRAVENOUS

## 2014-09-26 NOTE — Progress Notes (Signed)
Echocardiogram 2D Echocardiogram limited has been performed.  Alison Gross 09/26/2014, 2:32 PM

## 2014-09-26 NOTE — Progress Notes (Signed)
ANTICOAGULATION CONSULT NOTE - Follow Up Consult  Pharmacy Consult for heparin Indication: atrial fibrillation  No Known Allergies  Patient Measurements: Height: '5\' 5"'$  (165.1 cm) Weight: 135 lb 12.9 oz (61.6 kg) IBW/kg (Calculated) : 57 Heparin Dosing Weight: 63 kg  Vital Signs: Temp: 99 F (37.2 C) (07/19 0345) Temp Source: Oral (07/19 0345) BP: 99/44 mmHg (07/19 0345) Pulse Rate: 54 (07/19 0345)  Labs:  Recent Labs  09/24/14 0315 09/25/14 0400 09/26/14 0337  HGB 9.1* 8.0* 8.8*  HCT 29.3* 24.9* 27.6*  PLT 240 247 256  LABPROT  --   --  15.4*  INR  --   --  1.20  HEPARINUNFRC 0.64 0.45 0.41  CREATININE 1.64* 1.52* 1.50*    Estimated Creatinine Clearance: 27.4 mL/min (by C-G formula based on Cr of 1.5).   Medications:  Prescriptions prior to admission  Medication Sig Dispense Refill Last Dose  . acetaminophen (TYLENOL) 500 MG tablet Take 500 mg by mouth every 6 (six) hours as needed for moderate pain or headache.   Past Month at Unknown time  . anastrozole (ARIMIDEX) 1 MG tablet TAKE 1 TABLET (1 MG TOTAL) BY MOUTH DAILY. (Patient taking differently: Take 1 mg by mouth daily with breakfast. ) 30 tablet 5 09/18/2014 at Unknown time  . atorvastatin (LIPITOR) 10 MG tablet Take 1 tablet (10 mg total) by mouth daily at 6 PM. 30 tablet 0 09/18/2014 at Unknown time  . feeding supplement, ENSURE ENLIVE, (ENSURE ENLIVE) LIQD Take 237 mLs by mouth 2 (two) times daily between meals. 237 mL 12 Past Week at Unknown time  . ferrous sulfate 325 (65 FE) MG tablet Take 1 tablet (325 mg total) by mouth 3 (three) times daily with meals. 90 tablet 0 Past Week at Unknown time  . furosemide (LASIX) 40 MG tablet Take 40 mg by mouth daily as needed for fluid or edema.   2 09/18/2014 at Unknown time  . linagliptin (TRADJENTA) 5 MG TABS tablet Take 5 mg by mouth daily.   09/18/2014 at Unknown time  . losartan (COZAAR) 100 MG tablet Take 100 mg by mouth daily.  2 09/18/2014 at Unknown time  .  Multiple Vitamin (MULTIVITAMIN WITH MINERALS) TABS Take 1 tablet by mouth every other day.    09/18/2014 at Unknown time  . Rivaroxaban (XARELTO) 15 MG TABS tablet Take 1 tablet (15 mg total) by mouth daily. 30 tablet 0 09/18/2014 at 2100  . levofloxacin (LEVAQUIN) 750 MG tablet Take 1 tablet (750 mg total) by mouth every other day. (Patient not taking: Reported on 09/19/2014) 2 tablet 0 Not Taking at Unknown time    Assessment:  81 y.o F on xarelto PTA for hx Afib.  Xarelto has been placed on hold since admission (last dose taken on 09/18/14 at 2100) d/t thoracentesis for R pleural effusion. HL remains therapeutic at 0.41. Hgb 8.8, plts 256. No signs of bleeding.    Goal of Therapy:  Heparin level 0.3-0.7 units/ml Monitor platelets by anticoagulation protocol: Yes   Plan:  Continue heparin gtt 950 units/hr Daily HL/CBC, monitor for bleeding F/u plans for warfarin; baseline INR 07/19 is 1.2   Angela Burke, PharmD Pharmacy Resident Pager: [phone] 09/26/2014,10:22 AM

## 2014-09-26 NOTE — Progress Notes (Signed)
PULMONARY / CRITICAL CARE MEDICINE   Name: Alison Gross MRN: [id number] DOB: 11/18/1932    ADMISSION DATE:  09/19/2014 CONSULTATION DATE:  09/20/14  REFERRING MD :  Jerilynn Mages. Short  REASON FOR CONSULTATION:  Shock   INITIAL PRESENTATION:  81 yo female presented with dyspnea and Rt pleural effusion.  Developed hypotension with concern for sepsis.  Transferred to ICU and PCCM assumed consulted.  She has hx of Breast cancer 2002 and 2014 s/p b/l mastectomy, Large Rt goiter, diastolic CHF, A fib, HTN, DM,  MGUS, Stage III CKD, and recurrent Rt pleural effusion.  STUDIES:  6/16 Rt pleural fluid >> 800 ml, protein 3.2, LDH 49, WBC 614 (84%L), atypical cells present on cytology 7/11 PET scan >> moderate/large Rt pleural effusion, massive thyroid goiter 7/12 CT chest >> third spacing of fluid, Large Rt pleural effusion, very large thyroid, centrilobular emphysema 7/12 Rt pleural fluid >> 1200 ml, glucose 99, protein < 3, LDH 66, WBC 1008 (53%L) 7/14 CT head/neck >> massive Rt thyroid, mod/large Rt effusion 7/14 Echo >> severe LVH, EF 55 to 40%, grade 2 diastolic dysfx, severe LA dilation, mod RA dilation, small/mod pericardial effusion 7/16 RUE doppler >>> positive for DVT in right axillary and [place] veins. Superficial thrombosis left cephalic vein. 7/18 Nuclear stress test >>> EF 62%, small fixed defect at inferior lateral wall.  SIGNIFICANT EVENTS: 7/12 Admit 7/13 Speech assessment >> stage 1 baby food; to ICU, pressors 7/14 off pressors; speech f/u >> D3 diet  SUBJECTIVE:  She had difficulty tolerating diet.  Still feels hoarse.  VITAL SIGNS: Temp:  [98.7 F (37.1 C)-99 F (37.2 C)] 99 F (37.2 C) (07/19 0345) Pulse Rate:  [54-114] 54 (07/19 0345) Resp:  [16] 16 (07/19 0345) BP: (99-159)/(44-65) 99/44 mmHg (07/19 0345) SpO2:  [98 %-99 %] 98 % (07/19 0345) Weight:  [61.6 kg (135 lb 12.9 oz)] 61.6 kg (135 lb 12.9 oz) (07/19 0345) HEMODYNAMICS:   INTAKE / OUTPUT:  Intake/Output Summary  (Last 24 hours) at 09/26/14 1020 Last data filed at 09/26/14 0800  Gross per 24 hour  Intake    600 ml  Output   4100 ml  Net  -3500 ml    PHYSICAL EXAMINATION: General: pleasant elderly female, in NAD Neuro:  Awake, normal strength HEENT:  Hoarse  Cardiovascular: irregular, 3/6 SEM Lungs:  Decreased BS Rt base Abdomen:  Soft, non tender Musculoskeletal:  No lesions, Rt arm edema Skin:  No rashes  LABS:  CBC  Recent Labs Lab 09/24/14 0315 09/25/14 0400 09/26/14 0337  WBC 6.2 6.4 5.9  HGB 9.1* 8.0* 8.8*  HCT 29.3* 24.9* 27.6*  PLT 240 247 256   Coag's  Recent Labs Lab 09/20/14 0355  09/21/14 0813 09/21/14 1901 09/22/14 0500 09/26/14 0337  APTT  --   < > 34 77* 91*  --   INR 1.62*  --   --   --   --  1.20  < > = values in this interval not displayed.   BMET  Recent Labs Lab 09/24/14 0315 09/25/14 0400 09/26/14 0337  NA 140 142 139  K 4.1 3.7 3.9  CL 117* 115* 112*  CO2 17* 20* 21*  BUN 31* 29* 23*  CREATININE 1.64* 1.52* 1.50*  GLUCOSE 118* 129* 113*   Electrolytes  Recent Labs Lab 09/24/14 0315 09/25/14 0400 09/26/14 0337  CALCIUM 8.8* 8.8* 9.1  MG  --  1.7  --    Sepsis Markers  Recent Labs Lab 09/20/14 1510 09/20/14 1745  LATICACIDVEN 1.6 1.6   Liver Enzymes  Recent Labs Lab 09/20/14 0355 09/20/14 1510  AST 21 23  ALT 16 14  ALKPHOS 63 63  BILITOT 0.8 0.7  ALBUMIN 2.9* 3.0*   Cardiac Enzymes  Recent Labs Lab 09/20/14 1745 09/21/14 0030 09/21/14 0530  TROPONINI 0.33* 0.30* 0.29*   Glucose  Recent Labs Lab 09/24/14 2144 09/25/14 0711 09/25/14 1410 09/25/14 1722 09/25/14 2105 09/26/14 0607  GLUCAP 193* 121* 72 106* 123* 112*    Imaging Nm Myocar Multi W/spect W/wall Motion / Ef  09/25/2014    Blood pressure demonstrated a hypertensive response to exercise.  Horizontal ST segment depression ST segment depression was noted during  stress in the II, III and aVF leads, beginning at 1 minutes of stress,   ending at 6 minutes of stress, and returning to baseline after 5-9 minutes  of recovery.   CLINICAL DATA:  81 year old female with a history of chest pain.  Cardiovascular risk factors include diabetes, hyperlipidemia.  EXAM: MYOCARDIAL IMAGING WITH SPECT (REST AND PHARMACOLOGIC-STRESS)  GATED LEFT VENTRICULAR WALL MOTION STUDY  LEFT VENTRICULAR EJECTION FRACTION  TECHNIQUE: Standard myocardial SPECT imaging was performed after resting intravenous injection of 10 mCi [id number]. Subsequently, intravenous infusion of Lexiscan was performed under the supervision of the Cardiology staff. At peak effect of the drug, 30 mCi [id number] was injected intravenously and standard myocardial SPECT imaging was performed. Quantitative gated imaging was also performed to evaluate left ventricular wall motion, and estimate left ventricular ejection fraction.  COMPARISON:  None.  FINDINGS: Perfusion: Small fixed defect at the inferior lateral wall on rest and perfusion images. No reversible ischemia identified.  Wall Motion: Normal left ventricular wall motion. No left ventricular dilation.  Left Ventricular Ejection Fraction: 62 %  End diastolic volume 71 ml  End systolic volume 27 ml  IMPRESSION: 1. Small fixed defect at the inferior lateral wall. No reversible ischemia identified.  2. Normal left ventricular wall motion.  3. Left ventricular ejection fraction 62%  4. Low-risk stress test findings*.  *2012 Appropriate Use Criteria for Coronary Revascularization Focused Update: J Am Coll Cardiol. 202706;23(7):628-315http://content.[email or web address]?articleid=[email or web address]   Electronically Signed   By: JaCorrie Mckusick.O.   On: 09/25/2014 14:24    09/25/2014   CLINICAL DATA:  [age]ear old female with a history of chest pain.  Cardiovascular risk factors include diabetes, hyperlipidemia.  EXAM: MYOCARDIAL IMAGING WITH SPECT (REST AND PHARMACOLOGIC-STRESS)  GATED LEFT VENTRICULAR WALL MOTION STUDY  LEFT VENTRICULAR  EJECTION FRACTION  TECHNIQUE: Standard myocardial SPECT imaging was performed after resting intravenous injection of 10 mCi [id number]. Subsequently, intravenous infusion of Lexiscan was performed under the supervision of the Cardiology staff. At peak effect of the drug, 30 mCi [id number] was injected intravenously and standard myocardial SPECT imaging was performed. Quantitative gated imaging was also performed to evaluate left ventricular wall motion, and estimate left ventricular ejection fraction.  COMPARISON:  None.  FINDINGS: Perfusion: Small fixed defect at the inferior lateral wall on rest and perfusion images. No reversible ischemia identified.  Wall Motion: Normal left ventricular wall motion. No left ventricular dilation.  Left Ventricular Ejection Fraction: 62 %  End diastolic volume 71 ml  End systolic volume 27 ml  IMPRESSION: 1. Small fixed defect at the inferior lateral wall. No reversible ischemia identified.  2. Normal left ventricular wall motion.  3. Left ventricular ejection fraction 62%  4. Low-risk stress test findings*.  *2012 Appropriate Use Criteria for Coronary Revascularization Focused Update: J Am Coll  Cardiol. 2706;23(7):628-315. http://content.[email or web address].aspx?articleid=1201161   Electronically Signed   By: Corrie Mckusick D.O.   On: 09/25/2014 14:24    Cultures: Rt pleural fluid 7/12 >>  Negative. Blood 7/13 >>  Negative.  Lines: Rt IJ CVL 7/13 >> 7/15  ASSESSMENT / PLAN:  Hypotension 2nd to adrenal insufficiency >> cortisol 9.9 from 7/13 - resolved. Plan: Monitor for recurrence. Continue MIVF.  Recurrent Rt exudate pleural effusion - cytology and cultures negative. ? of aspiration pneumonia >> seems less likely. Plan: Might need thoracic surgery evaluation / pleur-x given that effusion has recurred several times now. Pulmonary hygiene. No further abx.  Mild centrilobular emphysema noted on CT chest. Remote hx of smoking. Plan: Will need  PFT's when more stable >> can be done as outpt. PRN BD's.  Hoarseness. Dysphagia. Hx of GERD, gastric ulcer Plan: D1 diet. F/u with speech therapy. F/u with ENT in clinic. Protonix.  Massive Rt thyroid enlargement. Plan: Dr. [name] to discuss whether pt is surgical candidate with one of his partners. No indication XRT per oncology.  Hx of A fib, chronic diastolic CHF, HTN, HLD. Pericardial effusion. Plan: Per primary team  Stage 3 CKD. Oliguria. Plan: Per primary team.  Anemia of chronic disease. Plan: F/u CBC.  RUE DVT Plan: Continue heparin.  DM Plan: Per primary team.   Summary: Since pleural effusion has recurred several times, would be best to have thoracic surgery assess >> will likely need pleurx.  Thoracic surgery could then also assess goiter and significance of pericardial effusion.   Montey Hora, [facility] Pager: [phone]  or [phone] 09/26/2014, 10:32 AM

## 2014-09-26 NOTE — Plan of Care (Signed)
Problem: Phase I Progression Outcomes Goal: OOB as tolerated unless otherwise ordered Outcome: Not Progressing Pt has been in bed all shift and getting out of bed to use bedside commode. She was encouraged to sit up in the chair, but she defers to tomorrow. She states she feels much better today. Goal: Other Phase I Outcomes/Goals Outcome: Progressing Pt's appetite is much improved, eating 50 to 75% of meals.

## 2014-09-26 NOTE — Progress Notes (Signed)
[facility] PROGRESS NOTE  Alison Gross XBD:[id number] DOB: November 30, 1932 DOA: 09/19/2014 PCP: Maximino Greenland, MD  Brief Summary  Alison Gross is a 81 y.o. female with a hx of goiter, HLD, afib on chronic anticoagulation, chronic CHF, HTN, DM2 on tradjenta, breast cancer in remission, MGUS, and hx of recurrent R sided pleural effusions who presents to the [place] ED on 7/12 with worsening sob over the past several days. Pt is s/p thoracentesis 6/16 yielding 800cc.  In ED, chest imaging demonstrated moderate R sided effusion.  Of note, patient does have a known goiter. Family reported pt had been having more difficulty swallowing foods recently.  Essentially, she has had a chronic goiter, part of which was removed in the 1970s (external portion).  It is large, intrathoracic and was probably the cause of her chronic right effusion which she tolerated for many years.  She has had some marginal enlargement in the last few months which has led to dyspnea requiring thoracentesis, dysphonia, and dysphagia.  Ultimately, she needs treatment for her goiter or she will need palliative measures such as pleurex.  Options being explored are surgical removal, radiation.  As per [facility], high risk for major surgery and hence plans for right Pleurx catheter placement on 7/20.  STUDIES:  6/16 Rt pleural fluid >> 800 ml, protein 3.2, LDH 49, WBC 614 (84%L), atypical cells present on cytology but were finally reported as reactive mesothelial cells 7/11 PET scan >> moderate/large Rt pleural effusion, massive thyroid goiter 7/12 ADMIT 7/12 CT chest >> third spacing of fluid, Large Rt pleural effusion, very large thyroid, centrilobular emphysema 7/12 Rt thoracentesis with removal of 1200 mL 7/13 developed hypotension and transferred to ICU requiring IVF and vasopressors, Speech assessment >> stage 1 baby food 7/14 weaned off vasopressors 7/14 CT head/neck >> massive Rt thyroid, mod/large Rt effusion 7/14 Echo  >> severe LVH, EF 55 to 83%, grade 2 diastolic dysfx, severe LA dilation, mod RA dilation, small/mod pericardial effusion 7/15:  Right IJ TLC removed 7/16:  DVT in the RUE, transfer to [facility] for cardiothoracic surgery consultation in hospital, tapering hydrocortisone  Assessment/Plan   Goiter, large and compressing the right lung and likely contributing to her effusion, swallowing and speech difficulties.  See above.  TSH 1.115 on 6/15 and fT3 and fT4 wnl - Status post remote partial thyroidectomy and RAI in 2015. Reviewed old records and patient had seen Dr. Chalmers Cater, endocrinology in March 2013 -  Patient was transferred from [facility] to [facility] on 7/16 for thoracic surgery opinion regarding goiter surgery. - As per Dr. Virgie Dad succussion with radiation oncology-not appropriate for radiation treatment - 2 [facility] surgeons have evaluated patient and determined that she is high risk candidate for major surgery i.e. thyroidectomy. Hence plans are for right Pleurx catheter placement 7/20 for recurrent symptomatic pleural effusion.  Recurrent right pleural effusion likely secondary to her goiter -   Status post thoracentesis by interventional radiology on 7/12 -   Cytology from 6/16 and 7/12 both with reactive mesothelial cells -   Pleural fluid Cx NG -   Pleurx planned for 7/20. - Discussed with Dr. 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 The case was discussed by Dr. Sheran Fava with Dr. Wilburn Cornelia from ENT who will follow up as outpatient for evaluation -  Speech therapy  -  follow-up with ENT  in clinic    Dysphagia, likely secondary to large goiter.  Got choked up on dysphagia 2 diet. -   Resume dysphagia 1 diet indefinitely for now  Hypotension, was likely secondary to intravascular volume depletion/dehydration and relative adrenal  insufficiency.  She was given IVF and started on stress dose steroids. -  Off stress dose steroids since 7/17 - resolved. Now intermittent soft blood pressures may be related to Cardizem.  Acute DVT in RUE secondary to central line -  Central line removed on 7/15 -  Continue heparin gtt and plan to transition to warfarin:  Because of acute DVT and renal disease, will not be able to resume a NOAC.  Elevated troponin, chest pain-free, EKG demonstrates no acute ST segment elevation or T-wave inversions. She did previously have some T-wave inversions in the lateral leads and some mild ST segment elevations in the anterior leads which are stable.  Stable elevation due to CKD and effusion. - Cardiology evaluated and performed a stress test on 7/18: Negative  Acute on Chronic diastolic heart failure - Significantly volume overloaded. Started IV Lasix 7/17-dosed daily based on BMP - Patient has Foleys. Strict intake and output. - -3.7 L on 7/18  Paroxysmal atrial fibrillation, continue heparin gtt for now pending possible pleur-x catheter placement or surgery -  Will need to transition to warfarin given kidney function - Patient had A. fib with RVR overnight 7/16 and was started on Cardizem drip and titrate up to 10 mg/h. She reverted to sinus rhythm. Remains in sinus rhythm. Cardiology has changed to oral Cardizem 7/16.   Diabetes mellitus type 2 with well controlled blood sugars, A1c 6.2 last month -  Continue mod dose SSI  Right-sided breast cancer in remission, no evidence of recurrence on recent PET scan -  Continue arimidex - Dr. Virgie Dad courtesy f/u appreciated.   Hyperlipidemia, stable, continue atorvastatin   CKD stage IV,  Creatinine clearance less than 30 , creatinine 1.7 -   Minimize nephrotoxicity and renally dose medications - Creatinine gradually improving over the last 5 days.  Hypercalcemia secondary to dehydration and possible thyroid abnormalities (nonparathyroid  hypercalcemia), resolved.  -  Vitamin D 54, PTHrp wnl, PTH < 10  Iron deficiency anemia superimposed on MGUS. - continue iron supplementation - B12, folate, and TSH wnl - Hemoglobin stable.  Moderate malnutrition -  Nutrition  -  supplements  Constipation  -  Continue miralax -  Bisacodyl suppository -  Lactulose 30gm once  NSVT 1 on 7/17 - Continue monitoring on telemetry. Try to keep K >4 & magnesium >2.    Diet:  Dysphagia 1 Access:  PIV IVF:  off Proph:  Heparin gtt pending determination of procedure  Code Status: full  Family Communication: Discussed with patient's spouse at bedside on 7/17. Disposition Plan:   To be determined.   Consultants:  PCCM  IR  Thoracic surgery  Cardiology  Procedures:  Thoracentesis of right pleural effusion by IR on 09/19/2014  CT soft tissue neck pending  Foley catheter.  Right IJ-discontinued.   Antibiotics:  Unasyn 7/13 > 7/14  HPI/Subjective:  Mild dyspnea overnight. Body swelling decreasing.  Objective: Filed Vitals:   09/25/14 1829 09/25/14 2026 09/26/14 0345 09/26/14 1238  BP: 114/51 113/49 99/44 134/54  Pulse:  75 54 90  Temp:  98.7 F (37.1 C) 99 F (37.2 C)   TempSrc:  Oral Oral   Resp:  16 16   Height:      Weight:   61.6 kg (  135 lb 12.9 oz)   SpO2:  99% 98% 100%    Intake/Output Summary (Last 24 hours) at 09/26/14 1303 Last data filed at 09/26/14 1119  Gross per 24 hour  Intake    600 ml  Output   4300 ml  Net  -3700 ml   Filed Weights   09/24/14 0324 09/25/14 0442 09/26/14 0345  Weight: 61.19 kg (134 lb 14.4 oz) 63.4 kg (139 lb 12.4 oz) 61.6 kg (135 lb 12.9 oz)   Body mass index is 22.6 kg/(m^2).  Exam:   General:   Cachectic female,No acute distress, hoarse voice. Chronically ill-looking sitting comfortably propped up in bed without distress.  Cardiovascular:  S1 and S2 heard, RRR. No JVD, murmurs. 1+ pitting bilateral leg edema. Telemetry: Sinus rhythm. 7 beat NSVT at 8:30  PM on 7/17. PAF- now in SR  Respiratory:   Reduced breath sounds in the bases/bronchial breath sounds just above. Rest of lung fields clear to auscultation. No crackles, wheezing or rhonchi. No increased work of breathing.   Abdomen:   NABS, soft, NT/ND  MSK:   Normal tone and bulk, 1+ pitting edema of bilateral LE.  Bilateral upper extremity edema right >left. Not tense edema. Peripheral edema decreasing.  Neuro:  Grossly intact. Alert and oriented 3. No focal deficits.  Data Reviewed: Basic Metabolic Panel:  Recent Labs Lab 09/22/14 0500 09/23/14 0515 09/24/14 0315 09/25/14 0400 09/26/14 0337  NA 141 140 140 142 139  K 4.2 4.5 4.1 3.7 3.9  CL 115* 117* 117* 115* 112*  CO2 22 19* 17* 20* 21*  GLUCOSE 141* 88 118* 129* 113*  BUN 33* 37* 31* 29* 23*  CREATININE 1.88* 1.90* 1.64* 1.52* 1.50*  CALCIUM 9.0 8.5* 8.8* 8.8* 9.1  MG  --   --   --  1.7  --    Liver Function Tests:  Recent Labs Lab 09/20/14 0355 09/20/14 1510  AST 21 23  ALT 16 14  ALKPHOS 63 63  BILITOT 0.8 0.7  PROT 6.1* 6.5  ALBUMIN 2.9* 3.0*   No results for input(s): LIPASE, AMYLASE in the last 168 hours. No results for input(s): AMMONIA in the last 168 hours. CBC:  Recent Labs Lab 09/22/14 0500 09/23/14 0630 09/24/14 0315 09/25/14 0400 09/26/14 0337  WBC 8.2 10.3 6.2 6.4 5.9  HGB 7.9* 8.1* 9.1* 8.0* 8.8*  HCT 25.0* 26.9* 29.3* 24.9* 27.6*  MCV 90.3 93.7 88.5 87.4 86.5  PLT 272 248 240 247 256    Recent Results (from the past 240 hour(s))  Body fluid culture     Status: None   Collection Time: 09/19/14  4:19 PM  Result Value Ref Range Status   Specimen Description PLEURAL RIGHT  Final   Special Requests Normal  Final   Gram Stain   Final    CYTOSPIN SLIDE WBC PRESENT,BOTH PMN AND MONONUCLEAR NO ORGANISMS SEEN    Culture   Final    NO GROWTH 3 DAYS Performed at [facility]    Report Status 09/22/2014 FINAL  Final  Culture, blood (routine x 2)     Status: None    Collection Time: 09/20/14  5:45 PM  Result Value Ref Range Status   Specimen Description BLOOD LEFT ARM  Final   Special Requests BOTTLES DRAWN AEROBIC AND ANAEROBIC 5CC  Final   Culture   Final    NO GROWTH 5 DAYS Performed at [facility]    Report Status 09/25/2014 FINAL  Final  Culture, blood (  routine x 2)     Status: None   Collection Time: 09/20/14  5:54 PM  Result Value Ref Range Status   Specimen Description BLOOD LEFT HAND  Final   Special Requests BOTTLES DRAWN AEROBIC ONLY 1.5CC  Final   Culture   Final    NO GROWTH 5 DAYS Performed at [facility]    Report Status 09/25/2014 FINAL  Final  MRSA PCR Screening     Status: None   Collection Time: 09/20/14  6:45 PM  Result Value Ref Range Status   MRSA by PCR NEGATIVE NEGATIVE Final    Comment:        The GeneXpert MRSA Assay (FDA approved for NASAL specimens only), is one component of a comprehensive MRSA colonization surveillance program. It is not intended to diagnose MRSA infection nor to guide or monitor treatment for MRSA infections.      Studies: Nm Myocar Multi W/spect W/wall Motion / Ef  09/25/2014    Blood pressure demonstrated a hypertensive response to exercise.  Horizontal ST segment depression ST segment depression was noted during  stress in the II, III and aVF leads, beginning at 1 minutes of stress,  ending at 6 minutes of stress, and returning to baseline after 5-9 minutes  of recovery.   CLINICAL DATA:  81 year old female with a history of chest pain.  Cardiovascular risk factors include diabetes, hyperlipidemia.  EXAM: MYOCARDIAL IMAGING WITH SPECT (REST AND PHARMACOLOGIC-STRESS)  GATED LEFT VENTRICULAR WALL MOTION STUDY  LEFT VENTRICULAR EJECTION FRACTION  TECHNIQUE: Standard myocardial SPECT imaging was performed after resting intravenous injection of 10 mCi Tc-55msestamibi. Subsequently, intravenous infusion of Lexiscan was performed under the supervision of the Cardiology staff. At  peak effect of the drug, 30 mCi Tc-933mestamibi was injected intravenously and standard myocardial SPECT imaging was performed. Quantitative gated imaging was also performed to evaluate left ventricular wall motion, and estimate left ventricular ejection fraction.  COMPARISON:  None.  FINDINGS: Perfusion: Small fixed defect at the inferior lateral wall on rest and perfusion images. No reversible ischemia identified.  Wall Motion: Normal left ventricular wall motion. No left ventricular dilation.  Left Ventricular Ejection Fraction: 62 %  End diastolic volume 71 ml  End systolic volume 27 ml  IMPRESSION: 1. Small fixed defect at the inferior lateral wall. No reversible ischemia identified.  2. Normal left ventricular wall motion.  3. Left ventricular ejection fraction 62%  4. Low-risk stress test findings*.  *2012 Appropriate Use Criteria for Coronary Revascularization Focused Update: J Am Coll Cardiol. 201610;96(0):454-098http://content.onairportbarriers.comspx?articleid=1201161   Electronically Signed   By: JaCorrie Mckusick.O.   On: 09/25/2014 14:24    09/25/2014   CLINICAL DATA:  89ear old female with a history of chest pain.  Cardiovascular risk factors include diabetes, hyperlipidemia.  EXAM: MYOCARDIAL IMAGING WITH SPECT (REST AND PHARMACOLOGIC-STRESS)  GATED LEFT VENTRICULAR WALL MOTION STUDY  LEFT VENTRICULAR EJECTION FRACTION  TECHNIQUE: Standard myocardial SPECT imaging was performed after resting intravenous injection of 10 mCi Tc-9965mstamibi. Subsequently, intravenous infusion of Lexiscan was performed under the supervision of the Cardiology staff. At peak effect of the drug, 30 mCi Tc-34m59mtamibi was injected intravenously and standard myocardial SPECT imaging was performed. Quantitative gated imaging was also performed to evaluate left ventricular wall motion, and estimate left ventricular ejection fraction.  COMPARISON:  None.  FINDINGS: Perfusion: Small fixed defect at the inferior lateral  wall on rest and perfusion images. No reversible ischemia identified.  Wall Motion: Normal left ventricular wall motion.  No left ventricular dilation.  Left Ventricular Ejection Fraction: 62 %  End diastolic volume 71 ml  End systolic volume 27 ml  IMPRESSION: 1. Small fixed defect at the inferior lateral wall. No reversible ischemia identified.  2. Normal left ventricular wall motion.  3. Left ventricular ejection fraction 62%  4. Low-risk stress test findings*.  *2012 Appropriate Use Criteria for Coronary Revascularization Focused Update: J Am Coll Cardiol. 9728;20(6):015-615. http://content.airportbarriers.com.aspx?articleid=1201161   Electronically Signed   By: Corrie Mckusick D.O.   On: 09/25/2014 14:24    Scheduled Meds: . anastrozole  1 mg Oral Q breakfast  . atorvastatin  10 mg Oral q1800  . diltiazem  60 mg Oral 4 times per day  . feeding supplement  1 Container Oral TID BM  . feeding supplement (ENSURE ENLIVE)  237 mL Oral BID BM  . ferrous sulfate  325 mg Oral TID WC  . insulin aspart  0-15 Units Subcutaneous TID WC  . insulin aspart  0-5 Units Subcutaneous QHS  . linagliptin  5 mg Oral Daily  . pantoprazole  40 mg Oral Daily  . polyethylene glycol  17 g Oral Daily  . sodium chloride  1,000 mL Intravenous Once   Continuous Infusions: . heparin 950 Units/hr (09/25/14 1012)    Principal Problem:   Pleural effusion, right Active Problems:   HTN (hypertension)   Recurrent right pleural effusion   Stage III chronic kidney disease   Protein-calorie malnutrition, severe   Dyslipidemia   Type 2 diabetes mellitus with renal manifestations   Dyspnea   Hypotension   Dysphagia   Dysphonia   Pleural effusion on right   Substernal goiter   Vocal cord paralysis    Time spent:  25 minutes.   Vernell Leep, MD, FACP, FHM. [facility] Pager [phone]  If 7PM-7AM, please contact night-coverage [email or web address] Password [email or web address] 09/26/2014, 1:03 PM    LOS: 5 days

## 2014-09-26 NOTE — Consult Note (Addendum)
   [facility] Inpatient Consult   09/26/2014  MARKEYA MINCY 05-20-1932 [id number]   [facility] Care Management follow up. Spoke with patient and daughter, [name], at bedside. Discussed and explained [place] [facility] services. Patient lives with husband. Patient still is awaiting more tests and procedures. However, upon discharge she reports she will be interested in the additional follow up from [facility] services. Explained that [facility] will not interfere or replace services such as home health. Consents obtained. Contact information confirmed and Primary Care MD confirmed as Dr. Glendale Chard. Patient will receive post hospital discharge calls and will be evaluated for monthly home visits. Will continue to follow hospitalization course and request [facility] assignment once disposition is clear. Inpatient RNCM aware [facility] to follow.  Confirmed contact number as [phone].   Marthenia Rolling, MSN-Ed, RN,BSN [facility] Liaison [phone]

## 2014-09-26 NOTE — Care Management Note (Signed)
Case Management Note  Patient Details  Name: Alison Gross MRN: [id number] Date of Birth: May 06, 1932  Subjective/Objective:    Pt admitted for recurrent pl effusion and DVT                Action/Plan: PTA pt lived at home, NCM to follow for d/c needs- may need [facility]- plan for pleurX cath placement on 09/27/14  Expected Discharge Date:               Expected Discharge Plan:  [place]  In-House Referral:     Discharge planning Services  CM Consult  Post Acute Care Choice:    Choice offered to:     DME Arranged:    DME Agency:     HH Arranged:    [place] Agency:     Status of Service:  In process, will continue to follow  Medicare Important Message Given:  Yes-second notification given Date Medicare IM Given:    Medicare IM give by:    Date Additional Medicare IM Given:    Additional Medicare Important Message give by:     If discussed at [place] of Stay Meetings, dates discussed:  09/26/14  Additional Comments:  Dawayne Patricia, RN 09/26/2014, 3:00 PM

## 2014-09-26 NOTE — Progress Notes (Signed)
COURTESY NOTE:  Cytology from R thoracentesis 09/19/2014 negative ([id number]). The patient had 2 prior thoracenteses 11/16/2013 and 08/24/2014, both also cytologically negative.  -- this is not likely to be due to the patient's Right sided, stage IIB breast cancer (history summarized below)  I have asked Dr. Pablo Ledger whether XRT to shrink the goiter would be feasible/advisable and she does not favor that option.  If [facility] feels thyroidectomy is feasible and relatively safe, I would favor that option.   Will follow peripherally. Please let me know if I can be of help.    Oncology case summary:           81 y.o. Alison Gross   (1) status post bilateral mastectomies 05/10/2012, showing (a) on the left side, low-grade ductal carcinoma in situ; earlier biopsy had shown multiple areas of concern, one of which was invasive ductal carcinoma, grade 1, with abundant mucin, estrogen receptor positive, progesterone receptor negative, with an MIB-1 of 10, and no HER-2 amplification.  (b) on the right side, a pT2 pN1, stage IIB, invasive mucinous/ductal carcinoma, grade 1, estrogen and progesterone receptor positive, HER-2 negative, with an MIB-1 of 32%.  (2) opted against adjuvant chemotherapy  (3) adjuvant radiation, completed 08/26/2012  (4) started tamoxifen in late June 2014, discontinued due to coagulation concerns; started anastrozole 09/Alison/2015  (a) bone density 09/04/2013 showed osteoporosis (b) to start yearly zolendronate 02/20/2014  (5) no reconstruction planned  (6) labs show a mildly elevated M-Spike and free lambda light chains, stable, negative bone survey 12/06/2012

## 2014-09-26 NOTE — Progress Notes (Signed)
Patient ID: Alison Gross, female   DOB: March 24, 1932, 81 y.o.   MRN: [id number]      [address]       [place] [address]             [id number]        Alison Gross [facility] Medical Record #[id number] Date of Birth: 26-Dec-1932  Referring: No ref. provider found Primary Care: Maximino Greenland, MD  Chief Complaint:    Chief Complaint  Patient presents with  . Shortness of Breath    History of Present Illness:      Alison Gross is a 81 yo African American Female with known history of Thyroid Goiter since 2004, she was treated in 2015 for thyrothoxosis with I131 . Right Sided Breast Cancer in Remission Stage IIB treated with surgery and radiation and antiestrogen drugs , Hyperlipidemia, Atrial Fibrillation on chronic anticoagulation, HTN, DM, and Recurrent pleural effusions. The patient presented to [facility] Emergency Department on 09/19/2014 with complaints of worsening shortness of breath for the past several days. She had undergone several thoracentesis is the past with most recent being 6/16 which showed 800 cc of exudative fluid. CT chest obtained in the ED showed a moderate right sided pleural effusion very similar in appearance to one year ago. The patient was admitted to the medicine service for further workup. The patient underwent IR Thoracentesis which removed 1.2 liters hazy, amber fluid. The fluid was sent for cytology which revealed reactive mesothelial cells without evidence of malignancy. Fluid cultures were negative for bacteria. The patient also complained of dysphagia while in the hospital and SSLP consult was obtained and showed some dysfunction and her diet was adjusted. Post procedure the patient developed hypotension with acute shock. She required pressor support. Workup later revealed  Cortisol level of 9.9. She was treated with steroids and fluid balance was corrected. Troponin was mildly elevated and she was ruled out for acute cardiac  event. . Cardiac surgery was contacted and plans were made for elective outpatient consultation to consider surgical resection vs. Pleur-x catheter for palliative relief.  CXR was obtained this morning and shows a small right sided pleural effusion at this time. The patient continues to have dysphagia but has been tolerating a mechanical soft diet.   Patient has had significant weight loss over the past 6 months    Current Activity/ Functional Status: Patient is independent with mobility/ambulation, transfers, ADL's, IADL's.   Zubrod Score: At the time of surgery this patient's most appropriate activity status/level should be described as: _0     0    Normal activity, no symptoms _1     1    Restricted in physical strenuous activity but ambulatory, able to do out light work _2     2    Ambulatory and capable of self care, unable to do work activities, up and about                 more than 50%  Of the time                            _3     3    Only limited self care, in bed greater than 50% of waking hours _4     4    Completely disabled, no self care, confined to bed or chair _5     5    Moribund  Past Medical History  Diagnosis Date  .  Hypercholesterolemia     takes Crestor daily  . Thyroid disease     had iodine radiation  . Hypertension     takes Hyzaar daily  . Dysrhythmia     takes Carvedilol daily  . Pneumonia     history of;last time about 4-26yr ago  . GERD (gastroesophageal reflux disease)     takes Protonix daily  . Gastric ulcer   . History of blood transfusion   . History of colon polyps   . Anemia     takes iron pill daily  . Diabetes mellitus without complication     takes Tradjenta daily  . History of radiation therapy 07/12/12-08/26/12    right breast/  . Paroxysmal atrial fibrillation     PCP EKG 09/27/2013: A. Fibrillation.. EKG 09/29/2013: S. Tach.  . Breast cancer 04/12/12    right-pos lymph node/left-DCIS  . Shortness of breath on exertion   . Bruit of  left carotid artery   . Acute upper GI bleeding 01/24/2012  . MGUS (monoclonal gammopathy of unknown significance) 04/21/2013  . Osteoporosis 11/29/2013  . Recurrent right pleural effusion 07/26/2014  . Stage III chronic kidney disease 07/26/2014  . Goiter 07/27/2014  . Substernal goiter 12/23/2012  . Pleural effusion, right 12/23/2012  . Type 2 diabetes mellitus with renal manifestations 07/26/2014  . Dysphonia 09/20/2014  . Vocal cord paralysis     Suspected due to massive substernal goiter    Past Surgical History  Procedure Laterality Date  . Carpal tunnel release      left  . Breast biopsy    . Esophagogastroduodenoscopy  01/24/2012    Procedure: ESOPHAGOGASTRODUODENOSCOPY (EGD);  Surgeon: MJeryl Columbia MD;  [facility];  Service: Endoscopy;  Laterality: N/A;  . Thyroid goiter removed    . Cyst removed from back of neck    . Knee surgery      right with rods  . Esophagogastroduodenoscopy    . Colonoscopy    . Total mastectomy Bilateral 05/10/2012    Procedure: RIGHT modified mastectomy; LEFT total mastectomy;  Surgeon: CHaywood Lasso MD;  [facility]  Service: General;  Laterality: Bilateral;  . Axillary sentinel node biopsy Right 05/10/2012    Procedure: AXILLARY SENTINEL lymph  NODE BIOPSY;  Surgeon: CHaywood Lasso MD;  Location[facility]  Service: General;  Laterality: Right;  nuclear medicine injection right side  7:00 am   . Abdominal hysterectomy      fibroids, with bilateral SO    History  Smoking status  . Former Smoker -- 0.00 packs/day  . Types: Cigarettes  . Quit date: 07/26/1971  Smokeless tobacco  . Never Used    History  Alcohol Use No    History   Social History  . Marital Status: Married    Spouse Name: JJeneen Rinks . Number of Children: 2  . Years of Education: N/A   Occupational History  . Not on file.   Social History Main Topics  . Smoking status: Former Smoker -- 0.00 packs/day    Types: Cigarettes    Quit date: 07/26/1971   . Smokeless tobacco: Never Used  . Alcohol Use: No  . Drug Use: No  . Sexual Activity: Not Currently    Birth Control/ Protection: Surgical     Comment: menarche age 26,1st live birth 282 P2, no HRT   Other Topics Concern  . Not on file   Social History Narrative   Married.  Ambulates independently.  No Known Allergies  Current Facility-Administered Medications  Medication Dose Route Frequency Provider Last Rate Last Dose  . acetaminophen (TYLENOL) tablet 650 mg  650 mg Oral Q6H PRN Donne Hazel, MD      . albuterol (PROVENTIL) (2.5 MG/3ML) 0.083% nebulizer solution 2.5 mg  2.5 mg Nebulization Q4H PRN Janece Canterbury, MD   2.5 mg at 09/26/14 0727  . anastrozole (ARIMIDEX) tablet 1 mg  1 mg Oral Q breakfast Donne Hazel, MD   1 mg at 09/26/14 0856  . atorvastatin (LIPITOR) tablet 10 mg  10 mg Oral q1800 Donne Hazel, MD   10 mg at 09/25/14 1829  . bisacodyl (DULCOLAX) suppository 10 mg  10 mg Rectal Daily PRN Janece Canterbury, MD      . diltiazem (CARDIZEM) tablet 60 mg  60 mg Oral 4 times per day Dixie Dials, MD   60 mg at 09/26/14 0612  . feeding supplement (BOOST / RESOURCE BREEZE) liquid 1 Container  1 Container Oral TID BM Janece Canterbury, MD   1 Container at 09/25/14 2128  . feeding supplement (ENSURE ENLIVE) (ENSURE ENLIVE) liquid 237 mL  237 mL Oral BID BM Janece Canterbury, MD   237 mL at 09/24/14 1000  . ferrous sulfate tablet 325 mg  325 mg Oral TID WC Donne Hazel, MD   325 mg at 09/26/14 0855  . heparin ADULT infusion 100 units/mL (25000 units/250 mL)  950 Units/hr Intravenous Continuous Anh P Pham, RPH 9.5 mL/hr at 09/25/14 1012 950 Units/hr at 09/25/14 1012  . insulin aspart (novoLOG) injection 0-15 Units  0-15 Units Subcutaneous TID WC Donne Hazel, MD   2 Units at 09/25/14 1007  . insulin aspart (novoLOG) injection 0-5 Units  0-5 Units Subcutaneous QHS Donne Hazel, MD   0 Units at 09/19/14 2152  . linagliptin (TRADJENTA) tablet 5 mg  5 mg Oral Daily  Donne Hazel, MD   5 mg at 09/25/14 1409  . morphine 2 MG/ML injection 2 mg  2 mg Intravenous Q4H PRN Donne Hazel, MD   2 mg at 09/22/14 0546  . ondansetron (ZOFRAN) injection 4 mg  4 mg Intravenous Q6H PRN Donne Hazel, MD   4 mg at 09/19/14 2040  . pantoprazole (PROTONIX) EC tablet 40 mg  40 mg Oral Daily Chesley Mires, MD   40 mg at 09/25/14 1408  . polyethylene glycol (MIRALAX / GLYCOLAX) packet 17 g  17 g Oral Daily Anders Simmonds, MD   17 g at 09/25/14 1430  . sodium chloride 0.9 % bolus 1,000 mL  1,000 mL Intravenous Once Gardiner Barefoot, NP        Prescriptions prior to admission  Medication Sig Dispense Refill Last Dose  . acetaminophen (TYLENOL) 500 MG tablet Take 500 mg by mouth every 6 (six) hours as needed for moderate pain or headache.   Past Month at Unknown time  . anastrozole (ARIMIDEX) 1 MG tablet TAKE 1 TABLET (1 MG TOTAL) BY MOUTH DAILY. (Patient taking differently: Take 1 mg by mouth daily with breakfast. ) 30 tablet 5 09/18/2014 at Unknown time  . atorvastatin (LIPITOR) 10 MG tablet Take 1 tablet (10 mg total) by mouth daily at 6 PM. 30 tablet 0 09/18/2014 at Unknown time  . feeding supplement, ENSURE ENLIVE, (ENSURE ENLIVE) LIQD Take 237 mLs by mouth 2 (two) times daily between meals. 237 mL 12 Past Week at Unknown time  . ferrous sulfate 325 (65 FE) MG tablet Take  1 tablet (325 mg total) by mouth 3 (three) times daily with meals. 90 tablet 0 Past Week at Unknown time  . furosemide (LASIX) 40 MG tablet Take 40 mg by mouth daily as needed for fluid or edema.   2 09/18/2014 at Unknown time  . linagliptin (TRADJENTA) 5 MG TABS tablet Take 5 mg by mouth daily.   09/18/2014 at Unknown time  . losartan (COZAAR) 100 MG tablet Take 100 mg by mouth daily.  2 09/18/2014 at Unknown time  . Multiple Vitamin (MULTIVITAMIN WITH MINERALS) TABS Take 1 tablet by mouth every other day.    09/18/2014 at Unknown time  . Rivaroxaban (XARELTO) 15 MG TABS tablet Take 1 tablet (15 mg total) by  mouth daily. 30 tablet 0 09/18/2014 at 2100  . levofloxacin (LEVAQUIN) 750 MG tablet Take 1 tablet (750 mg total) by mouth every other day. (Patient not taking: Reported on 09/19/2014) 2 tablet 0 Not Taking at Unknown time    Family History  Problem Relation Age of Onset  . Brain cancer Mother   . Aneurysm Father   . Diabetes Sister   . Diabetes Brother   . Diabetes Sister   . Diabetes Brother   . Heart failure Sister      Review of Systems:   Cardiac Review of Systems: Y or N Chest Pain [ n ] Resting SOB [ y ]Exertional SOB [ y ] Orthopnea _0  Pedal Edema [ y ] Palpitations [y ]Syncope _1  Presyncope _2  General Review of Systems: [Y] = yes _3 =no Constitional: recent weight change _4 ; anorexia _5 ; fatigue _6 ; nausea _7 ; night sweats _8 ; fever _9 ; or chills _10   Dental: poor dentition_11 ; Last Dentist visit:  Eye : blurred vision _12 ; diplopia _13 ; vision changes _14 ; Amaurosis fugax_15 ; Resp: cough _16 ; wheezing_17 ; hemoptysis_18 ; shortness of breath[ y ]; paroxysmal nocturnal dyspnea_19 ; dyspnea on exertion[y ]; or orthopnea_20 ;  GI: gallstones_21 , vomiting_22 ; dysphagia_23 ; melena_24 ; hematochezia _25 ; heartburn_26 ; Hx of Colonoscopy_27 ; GU: kidney stones _28 ; hematuria[ y ]; dysuria _29 ; nocturia_30 ; history of obstruction _31 ; urinary frequency _32   Skin: rash, swelling_33 ;, hair loss_34 ; peripheral edema_35 ; or itching_36 ; Musculosketetal: myalgias_37 ; joint swelling_38 ; joint erythema_39 ; joint pain_40 ; back pain_41 ; Heme/Lymph: bruising_42 ; bleeding_43 ; anemia_44 ;  Neuro: TIA_45 ; headaches_46 ; stroke_47 ; vertigo_48 ; seizures_49 ; paresthesias_50 ; difficulty walking_51 ; Psych:depression_52 ; anxiety[  ]; Endocrine: diabetes[y ]; thyroid dysfunction[y ]; Immunizations: Flu _53 ; Pneumococcal_54 ; Other:    Physical Exam: BP 99/44 mmHg  Pulse 54  Temp(Src) 99 F (37.2 C) (Oral)  Resp 16  Ht _55  (1.651 m)  Wt 135 lb 12.9 oz (61.6 kg)  BMI 22.60 kg/m2  SpO2 98%   General appearance: alert, cooperative, appears older than stated age, cachectic, fatigued and no distress Head: Normocephalic, without obvious abnormality, atraumatic Neck: no adenopathy, no carotid bruit, no JVD, supple, symmetrical, trachea midline and thyroid: nodular and enlarged toward right Lymph nodes: Cervical, supraclavicular, and axillary nodes normal. Resp: diminished breath sounds bibasilar Back: symmetric, no curvature. ROM normal. No CVA tenderness. Cardio: regular rate and rhythm, S1, S2 normal, no murmur, click, rub or gallop GI: soft, non-tender; bowel sounds normal; no masses,  no organomegaly Extremities: extremities normal, atraumatic, no cyanosis or edema and Homans sign is negative, no sign  of DVT Neurologic: Grossly normal  Diagnostic Studies & Laboratory data:     Recent Radiology Findings:    09/19/2014   CLINICAL DATA:  Increasing shortness of breath. Wheezing. Onset of symptoms last night.  EXAM: CHEST  2 VIEW  COMPARISON:  CT 09/18/2014.  FINDINGS: RIGHT paratracheal mass compatible with substernal goiter. Moderate RIGHT pleural effusion with compressive atelectasis. These findings appear similar to the CT yesterday. Aortic arch atherosclerosis. Axillary surgical clips bilaterally. Monitoring leads project over the chest. Cardiopericardial silhouette appears unchanged compared to prior chest radiographs. Lung volumes are lower than on previous study 08/24/2014.  IMPRESSION: 1. Moderate RIGHT pleural effusion with compressive/relaxation atelectasis of the RIGHT lower lobe and RIGHT middle lobe. 2. Large RIGHT peritracheal mass compatible with substernal goiter  evaluated on prior CT. 3. Lower lung volumes than on prior chest radiographs.   Electronically Signed   By: Dereck Ligas M.D.   On: 09/19/2014 08:11   Ct Soft Tissue Neck Wo Contrast  09/21/2014   CLINICAL DATA:  81 year old female with difficulty swallowing. Initial encounter. Right pleural effusion treated with thoracentesis 2 days ago. History of thyroid goiter. History of treated breast cancer in 2014.  EXAM: CT NECK WITHOUT CONTRAST  TECHNIQUE: Multidetector CT imaging of the neck was performed following the standard protocol without intravenous contrast.  COMPARISON:  PET-CT 09/18/2014, 10/28/2013.  Chest CT 10/28/2013.  FINDINGS: Chronic massive right thyroid lobe enlargement, with the vast majority of the enlarged right lobe occupying the mediastinum and right hemithorax.  Mediastinal involvement by right thyroid goiter seen as far back as 2004 (with about 7 cm of right thyroid lobe in the mediastinum at that time).  Currently the right lobe extends as far caudally as the right mainstem bronchus and encompasses approximately 8.0 by 9.8 x 15.0 cm (AP by transverse by CC), with an estimated volume of 588 mL. Compare that to a normal splenic volume of no larger than 412 mL.  Chronic heterogeneous density throughout the enlarged lobe. The left thyroid lobe is also chronically but more modestly enlarged encompassing about 4.3 x 3.1 x 6.4 cm (AP by transverse by CC). Despite the marked thyroid enlargement there is no significant mass effect on the trachea, carina, or right mainstem.  Asymmetry of the larynx suggests right vocal cord paralysis. No laryngeal or pharyngeal soft tissue mass is evident. Negative parapharyngeal, retropharyngeal, and sublingual spaces. Negative non contrast submandibular and parotid glands. No cervical lymphadenopathy identified in the absence of contrast. Negative visualized noncontrast brain parenchyma. Postoperative changes to the globes.  Chronic sphenoid sinus periosteal  thickening, but Visualized paranasal sinuses and mastoids are clear. Absent dentition. No acute or suspicious osseous lesion identified. Small sclerotic focus in the right manubrium is stable since 2004.  Right IJ approach central line in place.  Layering moderate to large right pleural effusion and smaller left pleural effusion. Compressive atelectasis in both lungs greater on the right.  IMPRESSION: 1. Massive chronic enlargement of the right thyroid lobe, with the vast majority of the enlarged lobe occupying the mediastinum and right hemithorax as far down as the right mainstem bronchus. See details above. 2. Despite this, no significant mass effect on the airway or trachea. 3. Layering moderate to large right and smaller left pleural effusions with right greater than left pulmonary compressive atelectasis. 4. Suggestion of right vocal cord paralysis. No laryngeal/pharyngeal mass or cervical lymphadenopathy identified in the absence of contrast.   Electronically Signed   By: Genevie Ann M.D.   On:  09/21/2014 10:06   Ct Chest Wo Contrast  09/19/2014   CLINICAL DATA:  Right breast cancer. Increasing shortness of breath.  EXAM: CT CHEST WITHOUT CONTRAST  TECHNIQUE: Multidetector CT imaging of the chest was performed following the standard protocol without IV contrast.  COMPARISON:  Multiple exams, including 09/18/2014 and 09/19/2014  FINDINGS: Mediastinum/Nodes: Prominent thyroid goiter with massively enlarged right lobe extending into the right hemithorax and measuring approximately 13.9 by 8.0 by 7.1 cm.  Diffuse subcutaneous edema. Aortic arch and branch vessel atherosclerosis. Low-level stranding in the mediastinum with small to moderate pericardial effusion.  Lungs/Pleura: Right pleural effusion occupies greater than 50% of right hemithoracic volume and is associated with passive atelectasis.  Centrilobular emphysema. Old granulomatous disease. Trace left pleural effusion.  Upper abdomen: Stranding in the  intra-abdominal adipose tissue.  Musculoskeletal: Thoracic spondylosis.  IMPRESSION: 1. Considerable third spacing of fluid with subcutaneous, mediastinal, and upper abdominal infiltrative edema. 2. Large right and trace left pleural effusions. 3. Very large thyroid goiter, with masslike extension of the right lobe into the right hemithorax as detailed on prior exams. 4. Centrilobular emphysema.   Electronically Signed   By: Van Clines M.D.   On: 09/19/2014 09:57   Nm Myocar Multi W/spect W/wall Motion / Ef  09/25/2014    Blood pressure demonstrated a hypertensive response to exercise.  Horizontal ST segment depression ST segment depression was noted during  stress in the II, III and aVF leads, beginning at 1 minutes of stress,  ending at 6 minutes of stress, and returning to baseline after 5-9 minutes  of recovery.   CLINICAL DATA:  81 year old female with a history of chest pain.  Cardiovascular risk factors include diabetes, hyperlipidemia.  EXAM: MYOCARDIAL IMAGING WITH SPECT (REST AND PHARMACOLOGIC-STRESS)  GATED LEFT VENTRICULAR WALL MOTION STUDY  LEFT VENTRICULAR EJECTION FRACTION  TECHNIQUE: Standard myocardial SPECT imaging was performed after resting intravenous injection of 10 mCi Tc-63msestamibi. Subsequently, intravenous infusion of Lexiscan was performed under the supervision of the Cardiology staff. At peak effect of the drug, 30 mCi Tc-960mestamibi was injected intravenously and standard myocardial SPECT imaging was performed. Quantitative gated imaging was also performed to evaluate left ventricular wall motion, and estimate left ventricular ejection fraction.  COMPARISON:  None.  FINDINGS: Perfusion: Small fixed defect at the inferior lateral wall on rest and perfusion images. No reversible ischemia identified.  Wall Motion: Normal left ventricular wall motion. No left ventricular dilation.  Left Ventricular Ejection Fraction: 62 %  End diastolic volume 71 ml  End systolic volume 27  ml  IMPRESSION: 1. Small fixed defect at the inferior lateral wall. No reversible ischemia identified.  2. Normal left ventricular wall motion.  3. Left ventricular ejection fraction 62%  4. Low-risk stress test findings*.  *2012 Appropriate Use Criteria for Coronary Revascularization Focused Update: J Am Coll Cardiol. 205277;82(4):235-361http://content.onairportbarriers.comspx?articleid=1201161   Electronically Signed   By: JaCorrie Mckusick.O.   On: 09/25/2014 14:24    7 Nm Pet Image Restag (ps) Skull Base To Thigh  09/18/2014   CLINICAL DATA:  Subsequent treatment strategy for breast carcinoma.  EXAM: NUCLEAR MEDICINE PET SKULL BASE TO THIGH  TECHNIQUE: 5.0 mCi F-18 FDG was injected intravenously. Full-ring PET imaging was performed from the skull base to thigh after the radiotracer. CT data was obtained and used for attenuation correction and anatomic localization.  FASTING BLOOD GLUCOSE:  Value: 94 mg/dl  COMPARISON:  By 10/28/2013 pleura PET-CT  FINDINGS: NECK  The thyroid gland is massively  enlarged with a large portion extending into the right hemi thorax. There is variable metabolic activity scattered throughout this enlarged gland which is to similar prior.  CHEST  There is a moderate-sized right pleural effusion. No hypermetabolic mediastinal lymph nodes. No hypermetabolic axillary nodes. No hypermetabolic pulmonary nodules.  ABDOMEN/PELVIS  No abnormal hypermetabolic activity within the liver, pancreas, adrenal glands, or spleen. No hypermetabolic lymph nodes in the abdomen or pelvis.  SKELETON  No focal hypermetabolic activity to suggest skeletal metastasis.  IMPRESSION: 1. No evidence of breast cancer recurrence or metastasis. 2. Moderate to large right pleural effusion. 3. Small free fluid in the pelvis. 4. Massive thyroid goiter extending into the right hemi thorax.   Electronically Signed   By: Suzy Bouchard M.D.   On: 09/18/2014 13:32   US Thoracentesis Asp Pleural Space W/img  Guide  09/19/2014   INDICATION: Breast cancer, dyspnea, recurrent right pleural effusion. Request is made for diagnostic and therapeutic right thoracentesis.  EXAM: ULTRASOUND GUIDED DIAGNOSTIC AND THERAPEUTIC RIGHT THORACENTESIS  COMPARISON:  Prior thoracentesis on 08/24/2014  MEDICATIONS: None  COMPLICATIONS: None immediate  TECHNIQUE: Informed written consent was obtained from the patient after a discussion of the risks, benefits and alternatives to treatment. A timeout was performed prior to the initiation of the procedure.  Initial ultrasound scanning demonstrates a moderate to large right pleural effusion. The lower chest was prepped and draped in the usual sterile fashion. 1% lidocaine was used for local anesthesia.  An ultrasound image was saved for documentation purposes. A 6 Fr Safe-T-Centesis catheter was introduced. The thoracentesis was performed. The catheter was removed and a dressing was applied. The patient tolerated the procedure well without immediate post procedural complication. The patient was escorted to have an upright chest radiograph.  FINDINGS: A total of approximately 1.2 liters of hazy, amber fluid was removed. Requested samples were sent to the laboratory.  IMPRESSION: Successful ultrasound-guided diagnostic and therapeutic right sided thoracentesis yielding 1.2 liters of pleural fluid.  Read by: Rowe Robert, PA-C   Electronically Signed   By: Markus Daft M.D.   On: 09/19/2014 16:31     I have independently reviewed the above radiologic studies.  Recent Lab Findings: Lab Results  Component Value Date   WBC 5.9 09/26/2014   HGB 8.8* 09/26/2014   HCT 27.6* 09/26/2014   PLT 256 09/26/2014   GLUCOSE 113* 09/26/2014   ALT 14 09/20/2014   AST 23 09/20/2014   NA 139 09/26/2014   K 3.9 09/26/2014   CL 112* 09/26/2014   CREATININE 1.50* 09/26/2014   BUN 23* 09/26/2014   CO2 21* 09/26/2014   TSH 1.115 08/23/2014   INR 1.20 09/26/2014   HGBA1C 6.2* 07/27/2014   Treatment  with I131: *RADIOLOGY REPORT*  Clinical Data: I 131 therapy for toxic multinodular goiter  NUCLEAR MEDICINE RADIOACTIVE IODINE THERAPY FOR HYPERTHYROIDISM  Technique: The risks and benefits of radioactive iodine therapy were discussed with the patient in detail. Alternative therapies were also mentioned. Radiation safety was discussed with the patient, including how to protect the general public from exposure. There were no barriers to communication. Written consent was obtained. The patient then received a capsule containing the radiopharmaceutical. The patient will follow-up with the referring physician.  Radiopharmaceutical: 43.4 mCi I 131 administered orally  Comparison: None.  Findings: See technique  IMPRESSION: Oral I 131 therapy for toxic multinodular goiter.  Original Report Authenticated By: Juanita Laster, M.D.     *RADIOLOGY REPORT*  Clinical Data: I 131 therapy  for toxic multinodular goiter  NUCLEAR MEDICINE RADIOACTIVE IODINE THERAPY FOR HYPERTHYROIDISM  Technique: The risks and benefits of radioactive iodine therapy were discussed with the patient in detail. Alternative therapies were also mentioned. Radiation safety was discussed with the patient, including how to protect the general public from exposure. There were no barriers to communication. Written consent was obtained. The patient then received a capsule containing the radiopharmaceutical. The patient will follow-up with the referring physician.  Radiopharmaceutical: 43.4 mCi I 131 administered orally  Comparison: None.  Findings: See technique  IMPRESSION: Oral I 131 therapy for toxic multinodular goiter.  Original Report Authenticated By: Juanita Laster, M.D.     ECHO: Study Conclusions  - Left ventricle: Cannot exclude infiltrative cardiomyopathy with speckled pattern. The cavity size was normal. There was severe concentric hypertrophy. The appearance  was consistent with hypertensive changes. Systolic function was normal. The estimated ejection fraction was in the range of 55% to 60%. Wall motion was normal; there were no regional wall motion abnormalities. Features are consistent with a pseudonormal left ventricular filling pattern, with concomitant abnormal relaxation and increased filling pressure (grade 2 diastolic dysfunction). Doppler parameters are consistent with both elevated ventricular end-diastolic filling pressure and elevated left atrial filling pressure. - Aortic valve: Quadricuspid. - Left atrium: The atrium was severely dilated. - Right atrium: The atrium was moderately dilated. - Atrial septum: No defect or patent foramen ovale was identified. - Pericardium, extracardiac: A small to moderate, free-flowing pericardial effusion was identified circumferential to the heart. The fluid had no internal echoes.There was no evidence of hemodynamic compromise. - Recommendations: Compared to 07/27/2014, no significant change. Pericardial effusion may be slightly larger, but no tamponade and fluid is clear.   Assessment / Plan:   1. Recurrent Right Pleural Effusion- S/P Thoracentesis - the patient has a large chronic multinodular mediastinal goiter. She's presented with recurrent right pleural effusions associated with shortness of breath. She has preserved systolic LV function but noted to have severe left ventricular hypertrophy and diastolic dysfunction both acute precipitating recent admission and chronic. With her severe protein malnutrition frail status cardiac dysfunction I am reluctant to recommend a major operative procedure on her to remove the goiter. I've discussed with the patient and her daughter a temporizing solution of placement of a right Pleurx catheter to keep the chronic right pleural effusion drained. She will then need further evaluation and treatment with aggressive heart failure  treatment and formal evaluation of her swallowing difficulties. We'll hold her heparin preoperatively and proceed with right Pleurx catheter placement tomorrow. She does have a pericardial effusion that appears stable from May without evidence of tamponade physiology.  The goals risks and alternatives of the planned surgical procedure  Right pleurix cathater and drainage of pleural effusion  have been discussed with the patient in detail. The risks of the procedure including death, infection, stroke, myocardial infarction, bleeding, blood transfusion have all been discussed specifically.  I have quoted Alison Gross a 4 % of perioperative mortality and a complication rate as high as 20%. The patient's questions have been answered.CINDIA HUSTEAD is willing  to proceed with the planned procedure.   2. Large Substernal Goiter present since at least 2004, unchanged for past several years, treated 2015 with I131  3. Adrenal Insufficiency 4. DM 5. Chronic A. Fib- on Xarelto as outpatient, currently on hold 6 H/O Right sided Breast Cancer in Remission stage IIB -  1) status post bilateral mastectomies 05/10/2012, showing (a) on the left side,  low-grade ductal carcinoma in situ; earlier biopsy had shown multiple areas of concern, one of which was invasive ductal carcinoma, grade 1, with abundant mucin, estrogen receptor positive, progesterone receptor negative, with an MIB-1 of 10, and no HER-2 amplification.  (b) on the right side, a pT2 pN1, stage IIB, invasive mucinous/ductal carcinoma, grade 1, estrogen and progesterone receptor positive, HER-2 negative, with an MIB-1 of 32%.  (2) opted against adjuvant chemotherapy  (3) adjuvant radiation, completed 08/26/2012  (4) started tamoxifen in late June 2014, discontinued due to coagulation concerns; started anastrozole 11/29/2013  (a) bone density 09/04/2013 showed osteoporosis (b) to start yearly  zolendronate 02/20/2014  (5) no reconstruction planned  (6) labs show a mildly elevated M-Spike and free lambda light chains, stable, negative bone survey 12/06/2012            I  spent 40 minutes counseling the patient face to face and 50% or more the  time was spent in counseling and coordination of care. The total time spent in the appointment was 60 minutes.    [name] MD      [address] [place] [address] Office [phone]   Beeper [phone]  09/26/2014 10:54 AM

## 2014-09-26 NOTE — Consult Note (Signed)
   [facility] Inpatient Consult   09/26/2014  [name] [birth year] [phone] Spoke with inpatient RNCM regarding patient's post hospital needs. This writer, [facility], to follow up with patient. Chart reviewed for potential care management needs. Will continue to follow. Natividad Brood, RN BSN [facility] Liaison  [phone] business mobile phone

## 2014-09-26 NOTE — Progress Notes (Signed)
Speech Language Pathology Treatment: Dysphagia  Patient Details Name: Alison Gross MRN: [id number] DOB: 07/04/1932 Today's Date: 09/26/2014 Time: 7619-5093 SLP Time Calculation (min) (ACUTE ONLY): 22 min  Assessment / Plan / Recommendation Clinical Impression  Skilled treatment session focused on addressing dysphagia goals. Since last visit patient with reported difficulty with soft solids felt like she was choking, which resulted in diet downgrade to Dys.1 textures.  Patient demonstrated baseline throat clears which turned to coughs following thin and nectar-thick liquid cup sips despite verbal cues for small, single sips with multiple swallows. Trials of purees resulted in no new s/s of aspiration.  Daughter and patient concerned with difficulty and requested a "swallow x-ray."  SLP in agreement given bedside presentation and known aspiration risk.  Recommend MBS 7/21 due to patient being NPO for procedure tomorrow 7/20.     HPI Other Pertinent Information: 81 yo female with hx of breast cancer, right nodule goiter, HLD, afib on chronic anticoagulation, HTN, DM2 on tradjenta and hx of recurrent R sided pleural effusions who presented to the ED with subjectively worsening sob over the past several days per MD note. Pt is s/p thoracentesis 6/16 yielding 800cc of fluid and thoracentesis during this admission.  Chest imaging demonstrated moderate R sided effusion. Pt remained without hypoxia or tachypnea. Swallow evaluation ordered.  Pt reports dysphagia with food/drink over the last few months.  Has some chronic dysphagia due to goiter and voice changes after goiter surgery approximately 40 years ago.  Pt admits to weight loss but denies dysphagia being source. Pt reports frequently choking on bulky items as well as with liquids.   Pt has never had swallow evaluation completed.  CXR showed development of right lobe infiltrate, ? aspiraton.     Pertinent Vitals Pain Assessment: No/denies pain  SLP  Plan  Continue with current plan of care    Recommendations Diet recommendations: Dysphagia 1 (puree);Thin liquid Liquids provided via: Cup;Straw Medication Administration: Crushed with puree Supervision: Patient able to self feed Compensations: Slow rate;Small sips/bites;Follow solids with liquid;Multiple dry swallows after each bite/sip;Other (Comment);Externally pace Postural Changes and/or Swallow Maneuvers: Seated upright 90 degrees;Upright 30-60 min after meal              General recommendations: Other(comment) (MBS) Oral Care Recommendations: Oral care BID Follow up Recommendations: 24 hour supervision/assistance;Home health SLP Plan: Continue with current plan of care    GO    Carmelia Roller., CCC-SLP [phone]  [place] 09/26/2014, 4:45 PM

## 2014-09-27 ENCOUNTER — Inpatient Hospital Stay (HOSPITAL_COMMUNITY): Payer: Commercial Managed Care - HMO | Admitting: Certified Registered Nurse Anesthetist

## 2014-09-27 ENCOUNTER — Encounter (HOSPITAL_COMMUNITY)
Admission: EM | Disposition: A | Discharge status: Home Health | Payer: Self-pay | Source: Home / Self Care | Attending: Internal Medicine

## 2014-09-27 ENCOUNTER — Inpatient Hospital Stay (HOSPITAL_COMMUNITY): Payer: Commercial Managed Care - HMO

## 2014-09-27 ENCOUNTER — Encounter (HOSPITAL_COMMUNITY): Payer: Self-pay | Admitting: Certified Registered Nurse Anesthetist

## 2014-09-27 DIAGNOSIS — E1122 Type 2 diabetes mellitus with diabetic chronic kidney disease: Secondary | ICD-10-CM

## 2014-09-27 DIAGNOSIS — J948 Other specified pleural conditions: Secondary | ICD-10-CM

## 2014-09-27 DIAGNOSIS — R1310 Dysphagia, unspecified: Secondary | ICD-10-CM

## 2014-09-27 DIAGNOSIS — E43 Unspecified severe protein-calorie malnutrition: Secondary | ICD-10-CM

## 2014-09-27 DIAGNOSIS — J90 Pleural effusion, not elsewhere classified: Secondary | ICD-10-CM

## 2014-09-27 DIAGNOSIS — R490 Dysphonia: Secondary | ICD-10-CM

## 2014-09-27 DIAGNOSIS — I480 Paroxysmal atrial fibrillation: Secondary | ICD-10-CM

## 2014-09-27 DIAGNOSIS — N189 Chronic kidney disease, unspecified: Secondary | ICD-10-CM

## 2014-09-27 DIAGNOSIS — I5033 Acute on chronic diastolic (congestive) heart failure: Secondary | ICD-10-CM

## 2014-09-27 HISTORY — PX: CHEST TUBE INSERTION: SHX231

## 2014-09-27 LAB — BODY FLUID CELL COUNT WITH DIFFERENTIAL
Eos, Fluid: 0 %
Lymphs, Fluid: 72 %
Monocyte-Macrophage-Serous Fluid: 16 % — ABNORMAL LOW (ref 50–90)
Neutrophil Count, Fluid: 12 % (ref 0–25)
Total Nucleated Cell Count, Fluid: 501 cu mm (ref 0–1000)

## 2014-09-27 LAB — BASIC METABOLIC PANEL
Anion gap: 9 (ref 5–15)
BUN: 22 mg/dL — ABNORMAL HIGH (ref 6–20)
CALCIUM: 8.9 mg/dL (ref 8.9–10.3)
CO2: 23 mmol/L (ref 22–32)
CREATININE: 1.49 mg/dL — AB (ref 0.44–1.00)
Chloride: 108 mmol/L (ref 101–111)
GFR calc non Af Amer: 32 mL/min — ABNORMAL LOW (ref >60)
GFR, EST AFRICAN AMERICAN: 37 mL/min — AB (ref >60)
GLUCOSE: 112 mg/dL — AB (ref 65–99)
Potassium: 3.8 mmol/L (ref 3.5–5.1)
Sodium: 140 mmol/L (ref 135–145)

## 2014-09-27 LAB — GLUCOSE, CAPILLARY
Glucose-Capillary: 96 mg/dL (ref 65–99)
Glucose-Capillary: 83 mg/dL (ref 65–99)
Glucose-Capillary: 82 mg/dL (ref 65–99)
Glucose-Capillary: 82 mg/dL (ref 65–99)
Glucose-Capillary: 105 mg/dL — ABNORMAL HIGH (ref 65–99)
Glucose-Capillary: 140 mg/dL — ABNORMAL HIGH (ref 65–99)

## 2014-09-27 LAB — CBC
HEMATOCRIT: 26.7 % — AB (ref 36.0–46.0)
Hemoglobin: 8.6 g/dL — ABNORMAL LOW (ref 12.0–15.0)
MCH: 28 pg (ref 26.0–34.0)
MCHC: 32.2 g/dL (ref 30.0–36.0)
MCV: 87 fL (ref 78.0–100.0)
Platelets: 275 10*3/uL (ref 150–400)
RBC: 3.07 MIL/uL — ABNORMAL LOW (ref 3.87–5.11)
RDW: 16.8 % — AB (ref 11.5–15.5)
WBC: 5.3 10*3/uL (ref 4.0–10.5)

## 2014-09-27 LAB — PROTIME-INR
INR: 1.17 (ref 0.00–1.49)
Prothrombin Time: 15.1 seconds (ref 11.6–15.2)

## 2014-09-27 LAB — LACTATE DEHYDROGENASE, PLEURAL OR PERITONEAL FLUID: LD, Fluid: 58 U/L — ABNORMAL HIGH (ref 3–23)

## 2014-09-27 LAB — GLUCOSE, SEROUS FLUID: Glucose, Fluid: 112 mg/dL

## 2014-09-27 LAB — PROTEIN, BODY FLUID: Total protein, fluid: <3 g/dL

## 2014-09-27 LAB — HEPARIN LEVEL (UNFRACTIONATED): Heparin Unfractionated: 0.2 IU/mL — ABNORMAL LOW (ref 0.30–0.70)

## 2014-09-27 LAB — APTT: aPTT: 96 seconds — ABNORMAL HIGH (ref 24–37)

## 2014-09-27 SURGERY — INSERTION, PLEURAL DRAINAGE CATHETER
Anesthesia: Monitor Anesthesia Care | Site: Chest | Laterality: Right

## 2014-09-27 MED ORDER — FUROSEMIDE 10 MG/ML IJ SOLN
40.0000 mg | Freq: Once | INTRAMUSCULAR | Status: AC
  Administered 2014-09-27: 40 mg via INTRAVENOUS
  Filled 2014-09-27 (×2): qty 4

## 2014-09-27 MED ORDER — ONDANSETRON HCL 4 MG/2ML IJ SOLN: 4.0000 mg | Freq: Once | INTRAMUSCULAR | Status: DC | PRN

## 2014-09-27 MED ORDER — LIDOCAINE 1 % OPTIME INJ - NO CHARGE
INTRAMUSCULAR | Status: DC | PRN
  Administered 2014-09-27: 5 mL

## 2014-09-27 MED ORDER — METOPROLOL TARTRATE 25 MG PO TABS
25.0000 mg | ORAL_TABLET | Freq: Four times a day (QID) | ORAL | Status: DC
  Filled 2014-09-27 (×6): qty 1

## 2014-09-27 MED ORDER — PROPOFOL INFUSION 10 MG/ML OPTIME
INTRAVENOUS | Status: DC | PRN
  Administered 2014-09-27: 25 ug/kg/min via INTRAVENOUS

## 2014-09-27 MED ORDER — FENTANYL CITRATE (PF) 250 MCG/5ML IJ SOLN
INTRAMUSCULAR | Status: AC
  Filled 2014-09-27: qty 5

## 2014-09-27 MED ORDER — MEPERIDINE HCL 25 MG/ML IJ SOLN: 6.2500 mg | INTRAMUSCULAR | Status: DC | PRN

## 2014-09-27 MED ORDER — LACTATED RINGERS IV SOLN
INTRAVENOUS | Status: DC
  Administered 2014-09-27 – 2014-09-28 (×2): via INTRAVENOUS

## 2014-09-27 MED ORDER — HEPARIN (PORCINE) IN NACL 100-0.45 UNIT/ML-% IJ SOLN
1150.0000 [IU]/h | INTRAMUSCULAR | Status: DC
  Administered 2014-09-27 – 2014-09-30 (×4): 1050 [IU]/h via INTRAVENOUS
  Administered 2014-10-02 (×2): 1150 [IU]/h via INTRAVENOUS
  Filled 2014-09-27 (×7): qty 250

## 2014-09-27 MED ORDER — ALBUMIN HUMAN 25 % IV SOLN
12.5000 g | Freq: Once | INTRAVENOUS | Status: AC
  Administered 2014-09-27: 12.5 g via INTRAVENOUS
  Filled 2014-09-27: qty 50

## 2014-09-27 MED ORDER — HYDROMORPHONE HCL 1 MG/ML IJ SOLN: 0.2500 mg | INTRAMUSCULAR | Status: DC | PRN

## 2014-09-27 MED ORDER — FENTANYL CITRATE (PF) 100 MCG/2ML IJ SOLN
INTRAMUSCULAR | Status: DC | PRN
  Administered 2014-09-27 (×2): 25 ug via INTRAVENOUS

## 2014-09-27 SURGICAL SUPPLY — 34 items
ADH SKN CLS APL DERMABOND .7 (GAUZE/BANDAGES/DRESSINGS) ×1
BRUSH SCRUB EZ PLAIN DRY (MISCELLANEOUS) ×2 IMPLANT
CANISTER SUCTION 2500CC (MISCELLANEOUS) ×2 IMPLANT
COVER SURGICAL LIGHT HANDLE (MISCELLANEOUS) ×2 IMPLANT
COVER TRANSDUCER ULTRASND GEL (DRAPE) ×2 IMPLANT
DERMABOND ADVANCED (GAUZE/BANDAGES/DRESSINGS) ×1
DERMABOND ADVANCED .7 DNX12 (GAUZE/BANDAGES/DRESSINGS) ×1 IMPLANT
DRAPE C-ARM 42X72 X-RAY (DRAPES) ×2 IMPLANT
DRAPE LAPAROSCOPIC ABDOMINAL (DRAPES) ×2 IMPLANT
GLOVE BIO SURGEON STRL SZ 6.5 (GLOVE) ×4 IMPLANT
GLOVE BIOGEL PI IND STRL 6 (GLOVE) IMPLANT
GLOVE BIOGEL PI INDICATOR 6 (GLOVE) ×1
GLOVE SURG SS PI 7.0 STRL IVOR (GLOVE) ×1 IMPLANT
GOWN STRL REUS W/ TWL LRG LVL3 (GOWN DISPOSABLE) ×2 IMPLANT
GOWN STRL REUS W/ TWL XL LVL3 (GOWN DISPOSABLE) IMPLANT
GOWN STRL REUS W/TWL LRG LVL3 (GOWN DISPOSABLE) ×2
GOWN STRL REUS W/TWL XL LVL3 (GOWN DISPOSABLE) ×2
KIT BASIN OR (CUSTOM PROCEDURE TRAY) ×2 IMPLANT
KIT PLEURX DRAIN CATH 1000ML (MISCELLANEOUS) ×3 IMPLANT
KIT PLEURX DRAIN CATH 15.5FR (DRAIN) ×2 IMPLANT
KIT ROOM TURNOVER OR (KITS) ×2 IMPLANT
NDL HYPO 25GX1X1/2 BEV (NEEDLE) IMPLANT
NEEDLE HYPO 25GX1X1/2 BEV (NEEDLE) ×2 IMPLANT
NS IRRIG 1000ML POUR BTL (IV SOLUTION) ×2 IMPLANT
PACK GENERAL/GYN (CUSTOM PROCEDURE TRAY) ×2 IMPLANT
PAD ARMBOARD 7.5X6 YLW CONV (MISCELLANEOUS) ×4 IMPLANT
SET DRAINAGE LINE (MISCELLANEOUS) IMPLANT
SUT ETHILON 3 0 FSL (SUTURE) ×2 IMPLANT
SUT VIC AB 3-0 X1 27 (SUTURE) ×2 IMPLANT
SYR 30ML SLIP (SYRINGE) IMPLANT
TOWEL OR 17X24 6PK STRL BLUE (TOWEL DISPOSABLE) ×2 IMPLANT
TOWEL OR 17X26 10 PK STRL BLUE (TOWEL DISPOSABLE) ×2 IMPLANT
VALVE REPLACEMENT CAP (MISCELLANEOUS) IMPLANT
WATER STERILE IRR 1000ML POUR (IV SOLUTION) ×1 IMPLANT

## 2014-09-27 NOTE — Progress Notes (Signed)
ANTICOAGULATION CONSULT NOTE - Follow Up Consult  Pharmacy Consult for Heparin Indication: atrial fibrillation  No Known Allergies  Patient Measurements: Height: '5\' 5"'$  (165.1 cm) Weight: 133 lb 3.2 oz (60.419 kg) IBW/kg (Calculated) : 57 Heparin Dosing Weight: 60 kg  Vital Signs: Temp: 97.8 F (36.6 C) (07/20 1412) Temp Source: Oral (07/20 1412) BP: 113/55 mmHg (07/20 1412) Pulse Rate: 77 (07/20 1341)  Labs:  Recent Labs  09/25/14 0400 09/26/14 0337 09/26/14 2219 09/27/14 0349  HGB 8.0* 8.8* 9.4* 8.6*  HCT 24.9* 27.6* 28.9* 26.7*  PLT 247 256 285 275  APTT  --   --  96*  --   LABPROT  --  15.4* 15.1  --   INR  --  1.20 1.17  --   HEPARINUNFRC 0.45 0.41  --  0.20*  CREATININE 1.52* 1.50* 1.50* 1.49*    Estimated Creatinine Clearance: 27.5 mL/min (by C-G formula based on Cr of 1.49).  Assessment:   S/p PleurX catheter placement.   Heparin drip to resume ~6 hrs post-op per discussion with Dr. Servando Snare. No bolus.    Heparin level had been therapeutic for several days on 950 units/hr, then fell to 0.20 with 4am lab today. Drip increased to 1050 units/hr ~5am then held at 8am for OR.    Was on Xarelto for afib prior to admission; held for thoracentesis. New RUE DVT on 09/23/14.  Planning Coumadin when able to transition to PO due to current renal function.  Goal of Therapy:  Heparin level 0.3-0.7 units/ml Monitor platelets by anticoagulation protocol: Yes   Plan:   Resume heparin drip at 1050 units/hr at ~6:30pm.  Heparin level ~ 8 hrs after drip resumes.  Daily heparin level and CBC.  Will follow up plans for Coumadin when no further procedures planned.  Arty Baumgartner, [place] Pager: [phone] 09/27/2014,4:35 PM

## 2014-09-27 NOTE — Consult Note (Signed)
Ref: Maximino Greenland, MD   Subjective:  No significant change on repeat echocardiogram. Low blood pressure post lasix use and good diuresis.  Objective:  Vital Signs in the last 24 hours: Temp:  [97.8 F (36.6 C)-98.7 F (37.1 C)] 98.7 F (37.1 C) (07/20 2046) Pulse Rate:  [55-97] 97 (07/20 2046) Cardiac Rhythm:  [-] Normal sinus rhythm (07/20 1315) Resp:  [11-25] 18 (07/20 2046) BP: (83-145)/(35-72) 90/50 mmHg (07/20 2117) SpO2:  [97 %-100 %] 98 % (07/20 2046) Weight:  [60.419 kg (133 lb 3.2 oz)] 60.419 kg (133 lb 3.2 oz) (07/20 0435)  Physical Exam: BP Readings from Last 1 Encounters:  09/27/14 90/50    Wt Readings from Last 1 Encounters:  09/27/14 60.419 kg (133 lb 3.2 oz)    Weight change: -1.181 kg (-2 lb 9.7 oz)  HEENT: [place]/AT, Eyes-Brown, PERL, EOMI, Conjunctiva-Pale, Sclera-Non-icteric Neck: + JVD, No bruit, Trachea midline. Lungs:  Clearing, Bilateral. Cardiac:  Regular rhythm, normal S1 and S2, no S3. II/VI systolic and diastolic murmur Abdomen:  Soft, non-tender. Extremities:  1 + edema present. No cyanosis. No clubbing. CNS: AxOx3, Cranial nerves grossly intact, moves all 4 extremities. Right handed. Skin: Warm and dry.   Intake/Output from previous day: 07/19 0701 - 07/20 0700 In: 720 [P.O.:720] Out: 2451 [Urine:2450; Stool:1]    Lab Results: BMET    Component Value Date/Time   NA 140 09/27/2014 0349   NA 139 09/26/2014 2219   NA 139 09/26/2014 0337   NA 143 09/18/2014 0946   NA 142 08/04/2014 1344   NA 141 07/11/2014 1102   K 3.8 09/27/2014 0349   K 3.7 09/26/2014 2219   K 3.9 09/26/2014 0337   K 4.0 09/18/2014 0946   K 4.4 08/04/2014 1344   K 4.1 07/11/2014 1102   CL 108 09/27/2014 0349   CL 108 09/26/2014 2219   CL 112* 09/26/2014 0337   CL 107 08/05/2012 1009   CL 108* 06/03/2012 1553   CL 105 04/21/2012 0806   CO2 23 09/27/2014 0349   CO2 24 09/26/2014 2219   CO2 21* 09/26/2014 0337   CO2 30* 09/18/2014 0946   CO2 26 08/04/2014 1344    CO2 28 07/11/2014 1102   GLUCOSE 112* 09/27/2014 0349   GLUCOSE 120* 09/26/2014 2219   GLUCOSE 113* 09/26/2014 0337   GLUCOSE 94 09/18/2014 0946   GLUCOSE 108 08/04/2014 1344   GLUCOSE 119 07/11/2014 1102   GLUCOSE 128* 08/05/2012 1009   GLUCOSE 85 06/03/2012 1553   GLUCOSE 152* 04/21/2012 0806   BUN 22* 09/27/2014 0349   BUN 22* 09/26/2014 2219   BUN 23* 09/26/2014 0337   BUN 31.2* 09/18/2014 0946   BUN 45.5* 08/04/2014 1344   BUN 27.8* 07/11/2014 1102   CREATININE 1.49* 09/27/2014 0349   CREATININE 1.50* 09/26/2014 2219   CREATININE 1.50* 09/26/2014 0337   CREATININE 1.7* 09/18/2014 0946   CREATININE 1.8* 08/04/2014 1344   CREATININE 1.4* 07/11/2014 1102   CALCIUM 8.9 09/27/2014 0349   CALCIUM 9.0 09/26/2014 2219   CALCIUM 9.1 09/26/2014 0337   CALCIUM 11.8* 09/18/2014 0946   CALCIUM 11.6* 08/04/2014 1344   CALCIUM 10.7* 07/27/2014 0830   CALCIUM 11.3* 07/11/2014 1102   GFRNONAA 32* 09/27/2014 0349   GFRNONAA 32* 09/26/2014 2219   GFRNONAA 32* 09/26/2014 0337   GFRAA 37* 09/27/2014 0349   GFRAA 37* 09/26/2014 2219   GFRAA 37* 09/26/2014 0337   CBC    Component Value Date/Time   WBC 5.3 09/27/2014 0349  WBC 4.5 09/18/2014 0946   RBC 3.07* 09/27/2014 0349   RBC 3.34* 09/18/2014 0946   RBC 2.80* 01/24/2012 1011   HGB 8.6* 09/27/2014 0349   HGB 9.6* 09/18/2014 0946   HCT 26.7* 09/27/2014 0349   HCT 29.4* 09/18/2014 0946   PLT 275 09/27/2014 0349   PLT 319 09/18/2014 0946   MCV 87.0 09/27/2014 0349   MCV 87.9 09/18/2014 0946   MCH 28.0 09/27/2014 0349   MCH 28.7 09/18/2014 0946   MCHC 32.2 09/27/2014 0349   MCHC 32.7 09/18/2014 0946   RDW 16.8* 09/27/2014 0349   RDW 16.8* 09/18/2014 0946   LYMPHSABS 0.6* 09/19/2014 0750   LYMPHSABS 0.6* 09/18/2014 0946   MONOABS 0.8 09/19/2014 0750   MONOABS 0.6 09/18/2014 0946   EOSABS 0.5 09/19/2014 0750   EOSABS 0.3 09/18/2014 0946   BASOSABS 0.1 09/19/2014 0750   BASOSABS 0.1 09/18/2014 0946   HEPATIC Function  Panel  Recent Labs  09/20/14 0355 09/20/14 1510 09/26/14 2219  PROT 6.1* 6.5 6.0*   HEMOGLOBIN A1C No components found for: HGA1C,  MPG CARDIAC ENZYMES Lab Results  Component Value Date   TROPONINI 0.29* 09/21/2014   TROPONINI 0.30* 09/21/2014   TROPONINI 0.33* 09/20/2014   BNP No results for input(s): PROBNP in the last 8760 hours. TSH  Recent Labs  07/27/14 0515 08/23/14 0822  TSH 1.036 1.115   CHOLESTEROL No results for input(s): CHOL in the last 8760 hours.  Scheduled Meds: . albumin human  12.5 g Intravenous Once  . anastrozole  1 mg Oral Q breakfast  . atorvastatin  10 mg Oral q1800  . diltiazem  60 mg Oral 4 times per day  . feeding supplement  1 Container Oral TID BM  . feeding supplement (ENSURE ENLIVE)  237 mL Oral BID BM  . ferrous sulfate  325 mg Oral TID WC  . insulin aspart  0-15 Units Subcutaneous TID WC  . insulin aspart  0-5 Units Subcutaneous QHS  . linagliptin  5 mg Oral Daily  . metoprolol tartrate  25 mg Oral QID  . pantoprazole  40 mg Oral Daily  . polyethylene glycol  17 g Oral Daily  . sodium chloride  1,000 mL Intravenous Once   Continuous Infusions: . heparin 1,050 Units/hr (09/27/14 1909)  . lactated ringers 10 mL/hr at 09/27/14 1013   PRN Meds:.acetaminophen **OR** [DISCONTINUED] acetaminophen, albuterol, bisacodyl, morphine injection, [DISCONTINUED] ondansetron **OR** ondansetron (ZOFRAN) IV  Assessment/Plan: Diastolic heart failure, acute on chronic Paroxysmal Atrial fibrillation Right breast cancer in remission Hyperlipidemia Hypertension DM, II Recurrent pleural effusions Large substernal thyroid goiter Hypertrophic cardiomyopathy with mild LV outflow obstruction Quadricuspid AV without stenosis Dilated LA and RA Small Pericardial effusion Iron deficiency anemia Emphysema Hypoalbuminemia  IV albumin to increase intravascular volume and then use B-blocker as tolerated.     LOS: 6 days    Dixie Dials  MD   09/27/2014, 9:43 PM

## 2014-09-27 NOTE — Brief Op Note (Signed)
09/27/2014  12:07 PM  PATIENT:  Alison Gross  81 y.o. female  PRE-OPERATIVE DIAGNOSIS:  (R) PLEURAL EFFUSION   POST-OPERATIVE DIAGNOSIS:  (R) PLEURAL EFFUSION  PROCEDURE:  Procedure(s): INSERTION OF RIGHT PLEURAL DRAINAGE CATHETER (Right) With fluro and ultrasound guidance  SURGEON:  Surgeon(s) and Role:    * [name], MD - Primary  ANESTHESIA:   MAC EBL:  Total I/O In: -  Out: 250 [Urine:250]  BLOOD ADMINISTERED:none  DRAINS: right pleurix   LOCAL MEDICATIONS USED:  LIDOCAINE  and Amount: 5 ml  SPECIMEN:  Source of Specimen:  right pleural fluid  DISPOSITION OF SPECIMEN:  PATHOLOGY and lab for cell count, LDH, protein,glucose   COUNTS:  YES  TOURNIQUET:  * No tourniquets in log *  DICTATION: .Dragon Dictation  PLAN OF CARE: already admission  PATIENT DISPOSITION:  PACU - hemodynamically stable.   Delay start of Pharmacological VTE agent (>24hrs) due to surgical blood loss or risk of bleeding: yes

## 2014-09-27 NOTE — Progress Notes (Signed)
ANTICOAGULATION CONSULT NOTE - Follow Up Consult  Pharmacy Consult for Heparin  Indication: atrial fibrillation  No Known Allergies  Patient Measurements: Height: '5\' 5"'$  (165.1 cm) Weight: 133 lb 3.2 oz (60.419 kg) IBW/kg (Calculated) : 57 Vital Signs: Temp: 98.5 F (36.9 C) (07/20 0435) Temp Source: Oral (07/20 0435) BP: 100/42 mmHg (07/20 0453) Pulse Rate: 55 (07/20 0435)  Labs:  Recent Labs  09/25/14 0400 09/26/14 0337 09/26/14 2219 09/27/14 0349  HGB 8.0* 8.8* 9.4* 8.6*  HCT 24.9* 27.6* 28.9* 26.7*  PLT 247 256 285 275  APTT  --   --  96*  --   LABPROT  --  15.4* 15.1  --   INR  --  1.20 1.17  --   HEPARINUNFRC 0.45 0.41  --  0.20*  CREATININE 1.52* 1.50* 1.50*  --     Estimated Creatinine Clearance: 27.4 mL/min (by C-G formula based on Cr of 1.5).  Assessment: Sub-therapeutic heparin level, infusing ok per RN, other labs as above.   Goal of Therapy:  Heparin level 0.3-0.7 units/ml Monitor platelets by anticoagulation protocol: Yes   Plan:  -Increase heparin to 1050 units/hr -1300 HL -Daily CBC/HL -Monitor for bleeding  Narda Bonds 09/27/2014,4:58 AM

## 2014-09-27 NOTE — Care Management Important Message (Signed)
Important Message  Patient Details  Name: Alison Gross MRN: [id number] Date of Birth: [birth year]   Medicare Important Message Given:  Yes-third notification given    [name] May 09/27/2014, 11:06 AMImportant Message  Patient Details  Name: Alison Gross MRN: [id number] Date of Birth: 07-Jul-1932   Medicare Important Message Given:  Yes-third notification given    [name] May 09/27/2014, 11:06 AM

## 2014-09-27 NOTE — Anesthesia Preprocedure Evaluation (Signed)
Anesthesia Evaluation  Patient identified by MRN, date of birth, ID band Patient awake    Reviewed: Allergy & Precautions, NPO status , Patient's Chart, lab work & pertinent test results  Airway Mallampati: I  TM Distance: >3 FB Neck ROM: Full    Dental   Pulmonary former smoker,    Pulmonary exam normal       Cardiovascular hypertension, Pt. on medications +CHF Normal cardiovascular exam+ dysrhythmias Atrial Fibrillation     Neuro/Psych    GI/Hepatic GERD-  Medicated and Controlled,  Endo/Other  diabetes, Type 2, Oral Hypoglycemic Agents  Renal/GU CRFRenal disease     Musculoskeletal   Abdominal   Peds  Hematology   Anesthesia Other Findings   Reproductive/Obstetrics                             Anesthesia Physical Anesthesia Plan  ASA: III  Anesthesia Plan: MAC   Post-op Pain Management:    Induction: Intravenous  Airway Management Planned: Natural Airway  Additional Equipment:   Intra-op Plan:   Post-operative Plan:   Informed Consent: I have reviewed the patients History and Physical, chart, labs and discussed the procedure including the risks, benefits and alternatives for the proposed anesthesia with the patient or authorized representative who has indicated his/her understanding and acceptance.     Plan Discussed with: CRNA and Surgeon  Anesthesia Plan Comments:         Anesthesia Quick Evaluation

## 2014-09-27 NOTE — Transfer of Care (Signed)
Immediate Anesthesia Transfer of Care Note  Patient: Alison Gross  Procedure(s) Performed: Procedure(s): INSERTION OF RIGHT PLEURAL DRAINAGE CATHETER (Right)  Patient Location: PACU  Anesthesia Type:MAC  Level of Consciousness: awake, alert , oriented and patient cooperative  Airway & Oxygen Therapy: Patient Spontanous Breathing and Patient connected to face mask oxygen  Post-op Assessment: Report given to RN, Post -op Vital signs reviewed and stable, Patient moving all extremities and Patient moving all extremities X 4  Post vital signs: Reviewed and stable  Last Vitals:  Filed Vitals:   09/27/14 0800  BP: 118/54  Pulse: 94  Temp: 37 C  Resp: 18    Complications: No apparent anesthesia complications

## 2014-09-27 NOTE — Progress Notes (Signed)
PROGRESS NOTE  Alison Gross JKD:[id number] DOB: Dec 05, 1932 DOA: 09/19/2014 PCP: Maximino Greenland, MD  Brief Summary  Alison Gross is a 81 y.o. female with a hx of goiter, HLD, afib on chronic anticoagulation, chronic CHF, HTN, DM2 on tradjenta, breast cancer in remission, MGUS, and hx of recurrent R sided pleural effusions who presents to the [place] ED on 7/12 with worsening sob over the past several days. Pt is s/p thoracentesis 6/16 yielding 800cc. In ED, chest imaging demonstrated moderate R sided effusion. Of note, patient does have a known goiter. Family reported pt had been having more difficulty swallowing foods recently. Essentially, she has had a chronic goiter, part of which was removed in the 1970s (external portion). It is large, intrathoracic and was probably the cause of her chronic right effusion which she tolerated for many years. She has had some marginal enlargement in the last few months which has led to dyspnea requiring thoracentesis, dysphonia, and dysphagia. Ultimately, she needs treatment for her goiter or she will need palliative measures such as pleurex. Options being explored are surgical removal, radiation. As per TCTS, high risk for major surgery and hence plans for right Pleurx catheter placement on 7/20.  STUDIES:  6/16 Rt pleural fluid >> 800 ml, protein 3.2, LDH 49, WBC 614 (84%L), atypical cells present on cytology but were finally reported as reactive mesothelial cells 7/11 PET scan >> moderate/large Rt pleural effusion, massive thyroid goiter 7/12 ADMIT 7/12 CT chest >> third spacing of fluid, Large Rt pleural effusion, very large thyroid, centrilobular emphysema 7/12 Rt thoracentesis with removal of 1200 mL 7/13 developed hypotension and transferred to ICU requiring IVF and vasopressors, Speech assessment >> stage 1 baby food 7/14 weaned off vasopressors 7/14 CT head/neck >> massive Rt thyroid, mod/large Rt effusion 7/14 Echo >>  severe LVH, EF 55 to 09%, grade 2 diastolic dysfx, severe LA dilation, mod RA dilation, small/mod pericardial effusion 7/15: Right IJ TLC removed 7/16: DVT in the RUE, transfer to [facility] for cardiothoracic surgery consultation in hospital, tapering hydrocortisone  Assessment/Plan  Goiter, large and compressing the right lung and likely contributing to her effusion, swallowing and speech difficulties. See above. TSH 1.115 on 6/15 and fT3 and fT4 wnl - Status post remote partial thyroidectomy and RAI in 2015. Reviewed old records and patient had seen Dr. Chalmers Cater, endocrinology in March 2013 - Patient was transferred from [facility] to [facility] on 7/16 for thoracic surgery opinion regarding goiter surgery. - As per Dr. Virgie Dad discussion with radiation oncology-not appropriate for radiation treatment - 2 [facility]CTS surgeons have evaluated patient and determined that she is high risk candidate for major surgery i.e. thyroidectomy.  -right Pleurx catheter placement 7/20 for recurrent symptomatic pleural effusion.  Recurrent right pleural effusion  - etiology unclear -likely secondary to her goiter vs CHF - transudate by Lights Criteria but had WBC 501 - Status post thoracentesis by interventional radiology on 7/12 - Cytology from 6/16 and 7/12 both with reactive mesothelial cells - Pleural fluid Cx NG - Pleurx 7/20. -Dr. Algis Liming discussed with Dr. 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 The case was discussed by Dr. Sheran Fava with Dr. Wilburn Cornelia from ENT who will follow up as outpatient for evaluation - Speech therapy  - follow-up with ENT in clinic   Dysphagia, likely  secondary to large goiter. Got choked up on dysphagia 2 diet. - Resume dysphagia 1 diet indefinitely for now - MBS scheduled on 7/21  Hypotension, was likely  secondary to intravascular volume depletion/dehydration and relative adrenal insufficiency. She was given IVF and started on stress dose steroids. - Off stress dose steroids since 7/17 - resolved. Now intermittent soft blood pressures may be related to Cardizem.  Acute DVT in RUE secondary to central line - Central line removed on 7/15 - Continue heparin gtt and plan to transition to warfarin: Because of acute DVT and renal disease, will not be able to resume a NOAC.  Elevated troponin, chest pain-free,  -EKG demonstrates no acute ST segment elevation or T-wave inversions. She did previously have some T-wave inversions in the lateral leads and some mild ST segment elevations in the anterior leads which are stable.  -Stable elevation due to CKD and effusion. - Cardiology evaluated and performed a stress test on 7/18: Negative  Acute on Chronic diastolic heart failure - Significantly volume overloaded. Started IV Lasix 7/17-dosed daily based on BMP - Patient has Foleys. Strict intake and output. - daily weight - redose lasix (started 7/17)  Paroxysmal atrial fibrillation, continue heparin gtt for now pending possible pleur-x catheter placement or surgery - Will need to transition to warfarin given kidney function - Patient had A. fib with RVR overnight 7/16 and was started on Cardizem drip and titrate up to 10 mg/h. She reverted to sinus rhythm. Remains in sinus rhythm. Cardiology has changed to oral Cardizem 7/16.  Diabetes mellitus type 2 with well controlled blood sugars,  - A1c 6.2  On 07/27/14 - Continue mod dose SSI - continue Tradjenta  Right-sided breast cancer in remission, no evidence of recurrence on recent PET scan - Continue arimidex - Dr. Virgie Dad courtesy f/u appreciated.  Hyperlipidemia, stable, continue atorvastatin  CKD stage III-IV, Creatinine clearance less than 30 , creatinine 1.7 - Minimize nephrotoxicity and renally dose medications -  Creatinine gradually improving over the last 5 days. - baseline creatinine 1.3-1.6  Hypercalcemia secondary to dehydration and possible thyroid abnormalities (nonparathyroid hypercalcemia), resolved.  - Vitamin D 54, PTHrp wnl, PTH < 10  Iron deficiency anemia superimposed on MGUS. - continue iron supplementation - B12, folate, and TSH wnl - Hemoglobin stable.  Moderate malnutrition - Nutrition  - supplements  Constipation  - Continue miralax - Bisacodyl suppository - Lactulose 30gm once  NSVT 1 on 7/17 - Continue monitoring on telemetry. Try to keep K >4 & magnesium >2.    Diet: Dysphagia 1 Access: PIV IVF: off Proph: Heparin gtt pending determination of procedure  Code Status: full  Family Communication: Discussed with patient's spouse at bedside on 7/17. Disposition Plan: To be determined.   Consultants:  PCCM  IR  Thoracic surgery  Cardiology  Procedures:  Thoracentesis of right pleural effusion by IR on 09/19/2014  CT soft tissue neck pending  Foley catheter.  Right IJ-discontinued.   Antibiotics:  Unasyn 7/13 > 7/14    Procedures/Studies: Dg Chest 1 View  09/19/2014   CLINICAL DATA:  Status post right thoracentesis  EXAM: CHEST  1 VIEW  COMPARISON:  09/19/2014  FINDINGS: Negative for pneumothorax following right thoracentesis. Trace right effusion remains. Improved right middle and lower lobe aeration with some residual atelectasis. Large mediastinal mass along the right peritracheal region compatible with a known massive thyroid goiter. Left lung remains clear. Stable cardiomegaly and vascularity. No superimposed edema.  IMPRESSION: Negative for pneumothorax following right thoracentesis. Decrease in  the right effusion. Otherwise stable exam.   Electronically Signed   By: Jerilynn Mages.  Shick M.D.   On: 09/19/2014 16:44   Dg Chest 2 View  09/27/2014   CLINICAL DATA:  Shortness of breath and cough  EXAM: CHEST  2 VIEW  COMPARISON:  Chest  radiograph September 26, 2014; chest CT September 19, 2014  FINDINGS: Pleural effusions bilaterally are stable. There is patchy consolidation in the bases, also stable. No new parenchymal lung opacity identified. The large right-sided thyroid goiter remains with deviation of the lower thoracic trachea toward the left. The heart size is within normal limits. Pulmonary vascularity is within normal limits. There are surgical clips in both axillary regions as well as in the right peritracheal region, stable. There is stable degenerative change in the thoracic spine.  IMPRESSION: No no appreciable change from 1 day prior. Bilateral effusions with bibasilar patchy airspace consolidation remain. This consolidation is felt to be largely due to atelectasis, although superimposed pneumonia must be of concern. No change in cardiac silhouette. Large right lobe thyroid goiter remains, stable. Tracheal deviation is likewise unchanged.   Electronically Signed   By: [name] III M.D.   On: 09/27/2014 07:35   Dg Chest 2 View  09/26/2014   CLINICAL DATA:  81 year old female with pleural effusion  EXAM: CHEST  2 VIEW  COMPARISON:  Chest radiograph dated 09/24/2014 and CT dated 09/19/2014  FINDINGS: Two views of the chest demonstrate emphysematous changes. There are moderate right and small left pleural effusion. There is associated partial compressive atelectasis of the right lower lobe. Superimposed pneumonia is not excluded. There is no pneumothorax. Large right thyroid mass again noted with mass effect and mild deviation of trachea to the left. There is degenerative changes of the spine.  IMPRESSION: No significant interval change in size of the bilateral pleural effusions.  Large right thyroid nodule.   Electronically Signed   By: Anner Crete M.D.   On: 09/26/2014 21:54   Dg Chest 2 View  09/19/2014   CLINICAL DATA:  Increasing shortness of breath. Wheezing. Onset of symptoms last night.  EXAM: CHEST  2 VIEW  COMPARISON:   CT 09/18/2014.  FINDINGS: RIGHT paratracheal mass compatible with substernal goiter. Moderate RIGHT pleural effusion with compressive atelectasis. These findings appear similar to the CT yesterday. Aortic arch atherosclerosis. Axillary surgical clips bilaterally. Monitoring leads project over the chest. Cardiopericardial silhouette appears unchanged compared to prior chest radiographs. Lung volumes are lower than on previous study 08/24/2014.  IMPRESSION: 1. Moderate RIGHT pleural effusion with compressive/relaxation atelectasis of the RIGHT lower lobe and RIGHT middle lobe. 2. Large RIGHT peritracheal mass compatible with substernal goiter evaluated on prior CT. 3. Lower lung volumes than on prior chest radiographs.   Electronically Signed   By: Dereck Ligas M.D.   On: 09/19/2014 08:11   Ct Soft Tissue Neck Wo Contrast  09/21/2014   CLINICAL DATA:  81 year old female with difficulty swallowing. Initial encounter. Right pleural effusion treated with thoracentesis 2 days ago. History of thyroid goiter. History of treated breast cancer in 2014.  EXAM: CT NECK WITHOUT CONTRAST  TECHNIQUE: Multidetector CT imaging of the neck was performed following the standard protocol without intravenous contrast.  COMPARISON:  PET-CT 09/18/2014, 10/28/2013.  Chest CT 10/28/2013.  FINDINGS: Chronic massive right thyroid lobe enlargement, with the vast majority of the enlarged right lobe occupying the mediastinum and right hemithorax.  Mediastinal involvement by right thyroid goiter seen as far back as 2004 (with about 7 cm  of right thyroid lobe in the mediastinum at that time).  Currently the right lobe extends as far caudally as the right mainstem bronchus and encompasses approximately 8.0 by 9.8 x 15.0 cm (AP by transverse by CC), with an estimated volume of 588 mL. Compare that to a normal splenic volume of no larger than 412 mL.  Chronic heterogeneous density throughout the enlarged lobe. The left thyroid lobe is also  chronically but more modestly enlarged encompassing about 4.3 x 3.1 x 6.4 cm (AP by transverse by CC). Despite the marked thyroid enlargement there is no significant mass effect on the trachea, carina, or right mainstem.  Asymmetry of the larynx suggests right vocal cord paralysis. No laryngeal or pharyngeal soft tissue mass is evident. Negative parapharyngeal, retropharyngeal, and sublingual spaces. Negative non contrast submandibular and parotid glands. No cervical lymphadenopathy identified in the absence of contrast. Negative visualized noncontrast brain parenchyma. Postoperative changes to the globes.  Chronic sphenoid sinus periosteal thickening, but Visualized paranasal sinuses and mastoids are clear. Absent dentition. No acute or suspicious osseous lesion identified. Small sclerotic focus in the right manubrium is stable since 2004.  Right IJ approach central line in place.  Layering moderate to large right pleural effusion and smaller left pleural effusion. Compressive atelectasis in both lungs greater on the right.  IMPRESSION: 1. Massive chronic enlargement of the right thyroid lobe, with the vast majority of the enlarged lobe occupying the mediastinum and right hemithorax as far down as the right mainstem bronchus. See details above. 2. Despite this, no significant mass effect on the airway or trachea. 3. Layering moderate to large right and smaller left pleural effusions with right greater than left pulmonary compressive atelectasis. 4. Suggestion of right vocal cord paralysis. No laryngeal/pharyngeal mass or cervical lymphadenopathy identified in the absence of contrast.   Electronically Signed   By: Genevie Ann M.D.   On: 09/21/2014 10:06   Ct Chest Wo Contrast  09/19/2014   CLINICAL DATA:  Right breast cancer. Increasing shortness of breath.  EXAM: CT CHEST WITHOUT CONTRAST  TECHNIQUE: Multidetector CT imaging of the chest was performed following the standard protocol without IV contrast.  COMPARISON:   Multiple exams, including 09/18/2014 and 09/19/2014  FINDINGS: Mediastinum/Nodes: Prominent thyroid goiter with massively enlarged right lobe extending into the right hemithorax and measuring approximately 13.9 by 8.0 by 7.1 cm.  Diffuse subcutaneous edema. Aortic arch and branch vessel atherosclerosis. Low-level stranding in the mediastinum with small to moderate pericardial effusion.  Lungs/Pleura: Right pleural effusion occupies greater than 50% of right hemithoracic volume and is associated with passive atelectasis.  Centrilobular emphysema. Old granulomatous disease. Trace left pleural effusion.  Upper abdomen: Stranding in the intra-abdominal adipose tissue.  Musculoskeletal: Thoracic spondylosis.  IMPRESSION: 1. Considerable third spacing of fluid with subcutaneous, mediastinal, and upper abdominal infiltrative edema. 2. Large right and trace left pleural effusions. 3. Very large thyroid goiter, with masslike extension of the right lobe into the right hemithorax as detailed on prior exams. 4. Centrilobular emphysema.   Electronically Signed   By: Van Clines M.D.   On: 09/19/2014 09:57   Nm Myocar Multi W/spect W/wall Motion / Ef  09/25/2014    Blood pressure demonstrated a hypertensive response to exercise.  Horizontal ST segment depression ST segment depression was noted during  stress in the II, III and aVF leads, beginning at 1 minutes of stress,  ending at 6 minutes of stress, and returning to baseline after 5-9 minutes  of recovery.   CLINICAL  DATA:  81 year old female with a history of chest pain.  Cardiovascular risk factors include diabetes, hyperlipidemia.  EXAM: MYOCARDIAL IMAGING WITH SPECT (REST AND PHARMACOLOGIC-STRESS)  GATED LEFT VENTRICULAR WALL MOTION STUDY  LEFT VENTRICULAR EJECTION FRACTION  TECHNIQUE: Standard myocardial SPECT imaging was performed after resting intravenous injection of 10 mCi Tc-16msestamibi. Subsequently, intravenous infusion of Lexiscan was performed  under the supervision of the Cardiology staff. At peak effect of the drug, 30 mCi Tc-925mestamibi was injected intravenously and standard myocardial SPECT imaging was performed. Quantitative gated imaging was also performed to evaluate left ventricular wall motion, and estimate left ventricular ejection fraction.  COMPARISON:  None.  FINDINGS: Perfusion: Small fixed defect at the inferior lateral wall on rest and perfusion images. No reversible ischemia identified.  Wall Motion: Normal left ventricular wall motion. No left ventricular dilation.  Left Ventricular Ejection Fraction: 62 %  End diastolic volume 71 ml  End systolic volume 27 ml  IMPRESSION: 1. Small fixed defect at the inferior lateral wall. No reversible ischemia identified.  2. Normal left ventricular wall motion.  3. Left ventricular ejection fraction 62%  4. Low-risk stress test findings*.  *2012 Appropriate Use Criteria for Coronary Revascularization Focused Update: J Am Coll Cardiol. 200998;33(8):250-539http://content.onairportbarriers.comspx?articleid=1201161   Electronically Signed   By: JaCorrie Mckusick.O.   On: 09/25/2014 14:24    09/25/2014   CLINICAL DATA:  [age]ear old female with a history of chest pain.  Cardiovascular risk factors include diabetes, hyperlipidemia.  EXAM: MYOCARDIAL IMAGING WITH SPECT (REST AND PHARMACOLOGIC-STRESS)  GATED LEFT VENTRICULAR WALL MOTION STUDY  LEFT VENTRICULAR EJECTION FRACTION  TECHNIQUE: Standard myocardial SPECT imaging was performed after resting intravenous injection of 10 mCi Tc-9960mstamibi. Subsequently, intravenous infusion of Lexiscan was performed under the supervision of the Cardiology staff. At peak effect of the drug, 30 mCi Tc-64m6mtamibi was injected intravenously and standard myocardial SPECT imaging was performed. Quantitative gated imaging was also performed to evaluate left ventricular wall motion, and estimate left ventricular ejection fraction.  COMPARISON:  None.  FINDINGS:  Perfusion: Small fixed defect at the inferior lateral wall on rest and perfusion images. No reversible ischemia identified.  Wall Motion: Normal left ventricular wall motion. No left ventricular dilation.  Left Ventricular Ejection Fraction: 62 %  End diastolic volume 71 ml  End systolic volume 27 ml  IMPRESSION: 1. Small fixed defect at the inferior lateral wall. No reversible ischemia identified.  2. Normal left ventricular wall motion.  3. Left ventricular ejection fraction 62%  4. Low-risk stress test findings*.  *2012 Appropriate Use Criteria for Coronary Revascularization Focused Update: J Am Coll Cardiol. 20127673;41(9):379-024tp://content.onliairportbarriers.comx?articleid=[email or web address]   Electronically Signed   By: JaimCorrie Mckusick.   On: 09/25/2014 14:24   Nm Pet Image Restag (ps) Skull Base To Thigh  09/18/2014   CLINICAL DATA:  Subsequent treatment strategy for breast carcinoma.  EXAM: NUCLEAR MEDICINE PET SKULL BASE TO THIGH  TECHNIQUE: 5.0 mCi F-18 FDG was injected intravenously. Full-ring PET imaging was performed from the skull base to thigh after the radiotracer. CT data was obtained and used for attenuation correction and anatomic localization.  FASTING BLOOD GLUCOSE:  Value: 94 mg/dl  COMPARISON:  By 10/28/2013 pleura PET-CT  FINDINGS: NECK  The thyroid gland is massively enlarged with a large portion extending into the right hemi thorax. There is variable metabolic activity scattered throughout this enlarged gland which is to similar prior.  CHEST  There is a moderate-sized right pleural effusion. No hypermetabolic mediastinal lymph nodes.  No hypermetabolic axillary nodes. No hypermetabolic pulmonary nodules.  ABDOMEN/PELVIS  No abnormal hypermetabolic activity within the liver, pancreas, adrenal glands, or spleen. No hypermetabolic lymph nodes in the abdomen or pelvis.  SKELETON  No focal hypermetabolic activity to suggest skeletal metastasis.  IMPRESSION: 1. No evidence of breast cancer  recurrence or metastasis. 2. Moderate to large right pleural effusion. 3. Small free fluid in the pelvis. 4. Massive thyroid goiter extending into the right hemi thorax.   Electronically Signed   By: Suzy Bouchard M.D.   On: 09/18/2014 13:32   Dg Chest Port 1 View  09/27/2014   CLINICAL DATA:  PleurX catheter insertion.  Right pleural effusion.  EXAM: PORTABLE CHEST - 1 VIEW 12:20 p.m.  COMPARISON:  Chest x-rays dated 09/27/2014 at 6:28 a.m., 09/24/2014 and 09/19/2014  FINDINGS: Right PleurX catheter has been inserted and the right pleural effusion has diminished. Tiny right apical pneumothorax.  Increased left pleural effusion. Heart size and vascularity are normal. Large spur right substernal goiter is unchanged.  IMPRESSION: PleurX catheter appears in good position. Significant decrease in right pleural effusion. Tiny right apical pneumothorax.  Increased small left effusion.   Electronically Signed   By: Lorriane Shire M.D.   On: 09/27/2014 12:40   Dg Chest Port 1 View  09/24/2014   CLINICAL DATA:  Shortness of breath.  Pleural effusion.  EXAM: PORTABLE CHEST - 1 VIEW  COMPARISON:  Multiple prior studies including 09/22/2014  FINDINGS: Right IJ central line has been removed. Heart is enlarged. Bilateral pleural effusions again noted. There is right basilar opacity, stable in appearance and consistent with atelectasis or infiltrate. Left lower lung opacity obscures the hemidiaphragm and is consistent with atelectasis or infiltrate. Right mediastinal mass again noted and stable.  IMPRESSION: Persistent bilateral lower lobe opacities and pleural effusions.  Removal of right IJ central line.   Electronically Signed   By: Nolon Nations M.D.   On: 09/24/2014 08:48   Dg Chest Port 1 View  09/22/2014   CLINICAL DATA:  Right pleural effusion and shortness of breath. Status post right thoracentesis on 09/19/2014. History of thyroid goiter and breast carcinoma.  EXAM: PORTABLE CHEST - 1 VIEW  COMPARISON:   09/21/2014 and CT of the chest on 09/19/2014.  FINDINGS: Since thoracentesis, there is slight reaccumulation of right pleural fluid with a relatively small effusion identified by chest x-ray. There likely is a small left pleural effusion. Right lower lung atelectasis versus infiltrate present. Stable appearance of jugular central line and large right-sided mediastinal thyroid goiter.  IMPRESSION: Slight reaccumulation of right pleural fluid with relatively small effusion present. There likely is a small left pleural effusion. Right lower lung atelectasis versus infiltrate.   Electronically Signed   By: Aletta Edouard M.D.   On: 09/22/2014 10:41   Dg Chest Port 1 View  09/21/2014   CLINICAL DATA:  Dyspnea. Pleural effusions. Large substernal goiter.  EXAM: PORTABLE CHEST - 1 VIEW  COMPARISON:  09/20/2014  FINDINGS: Central venous catheter is unchanged with the tip in the superior vena cava above the cavoatrial junction. Again noted is the large right-sided substernal goiter extending into the right hemi thorax. There is new  There is new slight interstitial pulmonary edema. Bilateral pleural effusions have slightly increased. Cardiomegaly is unchanged. Pulmonary vascularity appears slightly less prominent.  IMPRESSION: New interstitial edema.  Increasing effusions.   Electronically Signed   By: Lorriane Shire M.D.   On: 09/21/2014 07:03   Dg Chest [facility]  09/21/2014   CLINICAL DATA:  Central line placement  EXAM: PORTABLE CHEST - 1 VIEW  COMPARISON:  09/20/2014  FINDINGS: Interval placement of a right central venous catheter. Tip is projected over the low SVC region. The course of the catheter is somewhat lateral than would be expected but this is likely due to vascular displacement by a large right peritracheal mass lesion. Small amount of subcutaneous emphysema in the right neck. Persistent cardiac enlargement with mild pulmonary vascular congestion. Bilateral pleural effusions with basilar atelectasis  or infiltration bilaterally. Surgical clips in the axilla bilaterally. Calcification of the aorta. No pneumothorax.  IMPRESSION: Right central venous catheter appears to be in adequate position. Persistent large right peritracheal mass lesion. Cardiac enlargement with pulmonary vascular congestion and bilateral pleural effusions with basilar infiltration or atelectasis.   Electronically Signed   By: Lucienne Capers M.D.   On: 09/21/2014 00:30   Dg Chest Port 1 View  09/20/2014   CLINICAL DATA:  81 year old female status post right thoracentesis  EXAM: PORTABLE CHEST - 1 VIEW  COMPARISON:  Prior chest x-ray 09/19/2014  FINDINGS: Stable cardiomegaly. Atherosclerotic calcification again noted in the transverse aorta. Similar appearance of large right paratracheal mass. There may be slight interval reaccumulation of the right-sided pleural effusion. New patchy opacity in the right lung base. Inspiratory volumes are slightly lower. The left lung remains relatively clear. Surgical changes suggest prior bilateral mastectomies. No acute osseous abnormality.  IMPRESSION: 1. Interval development of patchy airspace opacity in the right lung base which may represent atelectasis, infiltrate or aspiration in the appropriate clinical setting. 2. Perhaps trace interval reaccumulation of right pleural fluid. 3. Stable right paratracheal mass.   Electronically Signed   By: Jacqulynn Cadet M.D.   On: 09/20/2014 14:45   Dg C-arm 1-60 Min-no Report  09/27/2014   CLINICAL DATA: surgery   C-ARM 1-60 MINUTES  Fluoroscopy was utilized by the requesting physician.  No radiographic  interpretation.    US Thoracentesis Asp Pleural Space W/img Guide  09/19/2014   INDICATION: Breast cancer, dyspnea, recurrent right pleural effusion. Request is made for diagnostic and therapeutic right thoracentesis.  EXAM: ULTRASOUND GUIDED DIAGNOSTIC AND THERAPEUTIC RIGHT THORACENTESIS  COMPARISON:  Prior thoracentesis on 08/24/2014  MEDICATIONS:  None  COMPLICATIONS: None immediate  TECHNIQUE: Informed written consent was obtained from the patient after a discussion of the risks, benefits and alternatives to treatment. A timeout was performed prior to the initiation of the procedure.  Initial ultrasound scanning demonstrates a moderate to large right pleural effusion. The lower chest was prepped and draped in the usual sterile fashion. 1% lidocaine was used for local anesthesia.  An ultrasound image was saved for documentation purposes. A 6 Fr Safe-T-Centesis catheter was introduced. The thoracentesis was performed. The catheter was removed and a dressing was applied. The patient tolerated the procedure well without immediate post procedural complication. The patient was escorted to have an upright chest radiograph.  FINDINGS: A total of approximately 1.2 liters of hazy, amber fluid was removed. Requested samples were sent to the laboratory.  IMPRESSION: Successful ultrasound-guided diagnostic and therapeutic right sided thoracentesis yielding 1.2 liters of pleural fluid.  Read by: Rowe Robert, PA-C   Electronically Signed   By: Markus Daft M.D.   On: 09/19/2014 16:31         Subjective: Patient is breathing better today. Denies any fevers, chills, chest pain, shortness breath, nausea, vomiting, diarrhea, belly pain, dysuria, hematuria. No rashes. No hematochezia or melena.  Objective: Filed Vitals:  09/27/14 1330 09/27/14 1341 09/27/14 1345 09/27/14 1412  BP:    113/55  Pulse:  77    Temp:   98.1 F (36.7 C) 97.8 F (36.6 C)  TempSrc:    Oral  Resp: '12 14  16  '$ Height:      Weight:      SpO2:  100%  97%    Intake/Output Summary (Last 24 hours) at 09/27/14 1521 Last data filed at 09/27/14 1339  Gross per 24 hour  Intake    640 ml  Output   2575 ml  Net  -1935 ml   Weight change: -1.181 kg (-2 lb 9.7 oz) Exam:   General:  Pt is alert, follows commands appropriately, not in acute distress  HEENT: No icterus, No thrush, No  neck mass, [place]/AT  Cardiovascular: IRRR, S1/S2, no rubs, no gallops  Respiratory: The patient crackles, right greater than left. No wheezing.  Abdomen: Soft/+BS, non tender, non distended, no guarding; no hepatosplenomegaly  Extremities: 2+LE edema, No lymphangitis, No petechiae, No rashes, no synovitis; no cyanosis or clubbing  Data Reviewed: Basic Metabolic Panel:  Recent Labs Lab 09/24/14 0315 09/25/14 0400 09/26/14 0337 09/26/14 2219 09/27/14 0349  NA 140 142 139 139 140  K 4.1 3.7 3.9 3.7 3.8  CL 117* 115* 112* 108 108  CO2 17* 20* 21* 24 23  GLUCOSE 118* 129* 113* 120* 112*  BUN 31* 29* 23* 22* 22*  CREATININE 1.64* 1.52* 1.50* 1.50* 1.49*  CALCIUM 8.8* 8.8* 9.1 9.0 8.9  MG  --  1.7  --   --   --    Liver Function Tests:  Recent Labs Lab 09/26/14 2219  AST 21  ALT 16  ALKPHOS 74  BILITOT 0.7  PROT 6.0*  ALBUMIN 3.0*   No results for input(s): LIPASE, AMYLASE in the last 168 hours. No results for input(s): AMMONIA in the last 168 hours. CBC:  Recent Labs Lab 09/24/14 0315 09/25/14 0400 09/26/14 0337 09/26/14 2219 09/27/14 0349  WBC 6.2 6.4 5.9 6.0 5.3  HGB 9.1* 8.0* 8.8* 9.4* 8.6*  HCT 29.3* 24.9* 27.6* 28.9* 26.7*  MCV 88.5 87.4 86.5 86.8 87.0  PLT 240 247 256 285 275   Cardiac Enzymes:  Recent Labs Lab 09/20/14 1745 09/21/14 0030 09/21/14 0530  TROPONINI 0.33* 0.30* 0.29*   BNP: Invalid input(s): POCBNP CBG:  Recent Labs Lab 09/26/14 1620 09/26/14 2128 09/27/14 0617 09/27/14 1018 09/27/14 1223  GLUCAP 106* 104* 96 83 82  82    Recent Results (from the past 240 hour(s))  Body fluid culture     Status: None   Collection Time: 09/19/14  4:19 PM  Result Value Ref Range Status   Specimen Description PLEURAL RIGHT  Final   Special Requests Normal  Final   Gram Stain   Final    CYTOSPIN SLIDE WBC PRESENT,BOTH PMN AND MONONUCLEAR NO ORGANISMS SEEN    Culture   Final    NO GROWTH 3 DAYS Performed at [facility]     Report Status 09/22/2014 FINAL  Final  Culture, blood (routine x 2)     Status: None   Collection Time: 09/20/14  5:45 PM  Result Value Ref Range Status   Specimen Description BLOOD LEFT ARM  Final   Special Requests BOTTLES DRAWN AEROBIC AND ANAEROBIC 5CC  Final   Culture   Final    NO GROWTH 5 DAYS Performed at [facility]    Report Status 09/25/2014 FINAL  Final  Culture,  blood (routine x 2)     Status: None   Collection Time: 09/20/14  5:54 PM  Result Value Ref Range Status   Specimen Description BLOOD LEFT HAND  Final   Special Requests BOTTLES DRAWN AEROBIC ONLY 1.5CC  Final   Culture   Final    NO GROWTH 5 DAYS Performed at [facility]    Report Status 09/25/2014 FINAL  Final  MRSA PCR Screening     Status: None   Collection Time: 09/20/14  6:45 PM  Result Value Ref Range Status   MRSA by PCR NEGATIVE NEGATIVE Final    Comment:        The GeneXpert MRSA Assay (FDA approved for NASAL specimens only), is one component of a comprehensive MRSA colonization surveillance program. It is not intended to diagnose MRSA infection nor to guide or monitor treatment for MRSA infections.   Body fluid culture     Status: None (Preliminary result)   Collection Time: 09/27/14 11:44 AM  Result Value Ref Range Status   Specimen Description FLUID RIGHT PLEURAL  Final   Special Requests PART B  Final   Gram Stain   Final    WBC PRESENT,BOTH PMN AND MONONUCLEAR NO ORGANISMS SEEN CYTOSPIN SMEAR    Culture PENDING  Incomplete   Report Status PENDING  Incomplete     Scheduled Meds: . [MAR Hold] anastrozole  1 mg Oral Q breakfast  . [MAR Hold] atorvastatin  10 mg Oral q1800  . [MAR Hold] diltiazem  60 mg Oral 4 times per day  . [MAR Hold] feeding supplement  1 Container Oral TID BM  . [MAR Hold] feeding supplement (ENSURE ENLIVE)  237 mL Oral BID BM  . [MAR Hold] ferrous sulfate  325 mg Oral TID WC  . [MAR Hold] insulin aspart  0-15 Units Subcutaneous TID WC  .  [MAR Hold] insulin aspart  0-5 Units Subcutaneous QHS  . [MAR Hold] linagliptin  5 mg Oral Daily  . [MAR Hold] pantoprazole  40 mg Oral Daily  . [MAR Hold] polyethylene glycol  17 g Oral Daily  . [MAR Hold] sodium chloride  1,000 mL Intravenous Once   Continuous Infusions: . heparin    . lactated ringers 10 mL/hr at 09/27/14 1013     Beauty Pless, DO  [facility] Pager [phone]  If 7PM-7AM, please contact night-coverage [email or web address] Password [email or web address] 09/27/2014, 3:21 PM   LOS: 6 days

## 2014-09-27 NOTE — Care Management Note (Addendum)
Case Management Note Marvetta Gibbons RN, BSN Unit 2W-Case Manager [phone]  Patient Details  Name: Alison Gross MRN: [id number] Date of Birth: Mar 24, 1932  Subjective/Objective:    Pt admitted for recurrent pl effusion and DVT                Action/Plan: PTA pt lived at home, NCM to follow for d/c needs- may need [facility]- plan for pleurX cath placement on 09/27/14  Expected Discharge Date:               Expected Discharge Plan:  [place] (pleurx insertion 7/20)  In-House Referral:     Discharge planning Services  CM Consult  Post Acute Care Choice:    Choice offered to:     DME Arranged:    DME Agency:     HH Arranged:    HH Agency:     Status of Service:  In process, will continue to follow  Medicare Important Message Given:  Yes-third notification given Date Medicare IM Given:    Medicare IM give by:    Date Additional Medicare IM Given:    Additional Medicare Important Message give by:     If discussed at [place] of Stay Meetings, dates discussed:  09/26/14, 09/28/14  Additional Comments: 09/27/14- 1430- pleurx cath placed today- signed forms found on shadow chart- spoke with pt/spouse- and daughter [name] at bedside- pt also has another daughter [name] who was not present- per conversation with pt and family plan is for pt to return home- [facility] has seen pt and will follow once pt returns home- also discussed [place] needs for home regarding PleurX cath drainage needs- list of [facility] agencies for [facility] left with pt/family for review will f/u with them for choice and arrange [place] once orders placed for discharge- PleurX forms started and will fax to [place] once [facility] agency known- will also speak with bedside RN to be sure box of catheter drainage kits is ordered for pt to take home on discharge.  NCM to continue to follow for d/c needs/planning  Dawayne Patricia, RN 09/27/2014, 3:15 PM

## 2014-09-27 NOTE — Anesthesia Postprocedure Evaluation (Signed)
Anesthesia Post Note  Patient: Alison Gross  Procedure(s) Performed: Procedure(s) (LRB): INSERTION OF RIGHT PLEURAL DRAINAGE CATHETER (Right)  Anesthesia type: general  Patient location: PACU  Post pain: Pain level controlled  Post assessment: Patient's Cardiovascular Status Stable  Last Vitals:  Filed Vitals:   09/27/14 1412  BP: 113/55  Pulse:   Temp: 36.6 C  Resp: 16    Post vital signs: Reviewed and stable  Level of consciousness: sedated  Complications: No apparent anesthesia complications

## 2014-09-27 NOTE — Progress Notes (Signed)
Xray ok after placement of pleurix. Will need cardiology involvement and aggressive plan for treatment of diastolic  heart failure . [name] MD      [address] [place] [address] Office [phone]   [place]

## 2014-09-28 ENCOUNTER — Inpatient Hospital Stay (HOSPITAL_COMMUNITY): Payer: Commercial Managed Care - HMO

## 2014-09-28 ENCOUNTER — Encounter (HOSPITAL_COMMUNITY): Payer: Self-pay | Admitting: Cardiothoracic Surgery

## 2014-09-28 LAB — GLUCOSE, CAPILLARY
Glucose-Capillary: 112 mg/dL — ABNORMAL HIGH (ref 65–99)
Glucose-Capillary: 137 mg/dL — ABNORMAL HIGH (ref 65–99)
Glucose-Capillary: 122 mg/dL — ABNORMAL HIGH (ref 65–99)

## 2014-09-28 LAB — BRAIN NATRIURETIC PEPTIDE: B Natriuretic Peptide: 947.8 pg/mL — ABNORMAL HIGH (ref 0.0–100.0)

## 2014-09-28 LAB — BASIC METABOLIC PANEL
Anion gap: 8 (ref 5–15)
BUN: 22 mg/dL — AB (ref 6–20)
CO2: 24 mmol/L (ref 22–32)
CREATININE: 1.5 mg/dL — AB (ref 0.44–1.00)
Calcium: 9.1 mg/dL (ref 8.9–10.3)
Chloride: 109 mmol/L (ref 101–111)
GFR, EST AFRICAN AMERICAN: 37 mL/min — AB (ref >60)
GFR, EST NON AFRICAN AMERICAN: 32 mL/min — AB (ref >60)
GLUCOSE: 130 mg/dL — AB (ref 65–99)
Potassium: 4 mmol/L (ref 3.5–5.1)
Sodium: 141 mmol/L (ref 135–145)

## 2014-09-28 LAB — HEPARIN LEVEL (UNFRACTIONATED)
Heparin Unfractionated: 0.31 IU/mL (ref 0.30–0.70)
Heparin Unfractionated: 0.47 IU/mL (ref 0.30–0.70)

## 2014-09-28 LAB — CBC
HCT: 29.1 % — ABNORMAL LOW (ref 36.0–46.0)
Hemoglobin: 9.5 g/dL — ABNORMAL LOW (ref 12.0–15.0)
MCH: 28.6 pg (ref 26.0–34.0)
MCHC: 32.6 g/dL (ref 30.0–36.0)
MCV: 87.7 fL (ref 78.0–100.0)
Platelets: 290 10*3/uL (ref 150–400)
RBC: 3.32 MIL/uL — AB (ref 3.87–5.11)
RDW: 16.9 % — ABNORMAL HIGH (ref 11.5–15.5)
WBC: 5.3 10*3/uL (ref 4.0–10.5)

## 2014-09-28 MED ORDER — FUROSEMIDE 40 MG PO TABS
40.0000 mg | ORAL_TABLET | Freq: Two times a day (BID) | ORAL | Status: AC
  Administered 2014-09-28 – 2014-09-29 (×2): 40 mg via ORAL
  Filled 2014-09-28 (×3): qty 1

## 2014-09-28 MED ORDER — METOPROLOL TARTRATE 12.5 MG HALF TABLET
12.5000 mg | ORAL_TABLET | Freq: Four times a day (QID) | ORAL | Status: DC
  Administered 2014-09-28 – 2014-10-02 (×14): 12.5 mg via ORAL
  Filled 2014-09-28 (×22): qty 1

## 2014-09-28 MED ORDER — AMIODARONE HCL 200 MG PO TABS
200.0000 mg | ORAL_TABLET | Freq: Two times a day (BID) | ORAL | Status: DC
  Administered 2014-09-28 – 2014-10-03 (×10): 200 mg via ORAL
  Filled 2014-09-28 (×11): qty 1

## 2014-09-28 MED ORDER — ALBUMIN HUMAN 25 % IV SOLN
12.5000 g | Freq: Once | INTRAVENOUS | Status: AC
  Administered 2014-09-28: 12.5 g via INTRAVENOUS
  Filled 2014-09-28: qty 50

## 2014-09-28 NOTE — Progress Notes (Signed)
ANTICOAGULATION CONSULT NOTE - Follow Up Consult  Pharmacy Consult for Heparin Indication: atrial fibrillation and RUE DVT  No Known Allergies  Patient Measurements: Height: '5\' 5"'$  (165.1 cm) Weight: 129 lb 1.6 oz (58.559 kg) IBW/kg (Calculated) : 57 Heparin Dosing Weight: 58.5 kg  Vital Signs: Temp: 98.3 F (36.8 C) (07/21 0431) Temp Source: Oral (07/21 0431) BP: 99/43 mmHg (07/21 0431) Pulse Rate: 74 (07/21 0431)  Labs:  Recent Labs  09/26/14 0337 09/26/14 2219 09/27/14 0349 09/28/14 0238 09/28/14 1021  HGB 8.8* 9.4* 8.6*  --  9.5*  HCT 27.6* 28.9* 26.7*  --  29.1*  PLT 256 285 275  --  290  APTT  --  96*  --   --   --   LABPROT 15.4* 15.1  --   --   --   INR 1.20 1.17  --   --   --   HEPARINUNFRC 0.41  --  0.20* 0.31 0.47  CREATININE 1.50* 1.50* 1.49* 1.50*  --     Estimated Creatinine Clearance: 27.4 mL/min (by C-G formula based on Cr of 1.5).  Assessment:   POD# 1 PleurX catheter placement.   Heparin drip resumed ~6 hrs post-op at 1050 units/hr.   Heparin level 0.31 overnight, now 0.47. Therapeutic.    Was on Xarelto for afib prior to admission; held for thoracentesis. New RUE DVT on 09/23/14 due to central line, removed 7/15.  Planning Coumadin when able to transition to PO due to current renal function.  Goal of Therapy:  Heparin level 0.3-0.7 units/ml Monitor platelets by anticoagulation protocol: Yes   Plan:   Continue heparin drip at 1050 units/hr.  Daily heparin level and CBC.  Will follow up plans for Coumadin when no further procedures planned.  Arty Baumgartner, [place] Pager: [phone] 09/28/2014,11:46 AM

## 2014-09-28 NOTE — Progress Notes (Signed)
ANTICOAGULATION CONSULT NOTE - Follow Up Consult  Pharmacy Consult for heparin Indication: atrial fibrillation   Labs:  Recent Labs  09/26/14 0337 09/26/14 2219 09/27/14 0349 09/28/14 0238  HGB 8.8* 9.4* 8.6*  --   HCT 27.6* 28.9* 26.7*  --   PLT 256 285 275  --   APTT  --  96*  --   --   LABPROT 15.4* 15.1  --   --   INR 1.20 1.17  --   --   HEPARINUNFRC 0.41  --  0.20* 0.31  CREATININE 1.50* 1.50* 1.49*  --      Assessment/Plan:  81yo female therapeutic on heparin after resumed. Will continue gtt at current rate and confirm stable with additional level.   Wynona Neat, PharmD, BCPS  09/28/2014,3:56 AM

## 2014-09-28 NOTE — Progress Notes (Signed)
R pleurex cath drained at this time; 750 ml blood tinged fluid drained; pt tolerated well first 5 min; pt started cramping; drain speed slowed; cath cont to drain for 15 min; family at bedside; new dressing applied; pt stated pain had eased off after drainage complete; will cont. To monitor.

## 2014-09-28 NOTE — Progress Notes (Signed)
Speech Language Pathology Treatment: Dysphagia  Patient Details Name: Alison Gross MRN: [id number] DOB: 12/29/1932 Today's Date: 09/28/2014 Time: 8280-0349 SLP Time Calculation (min) (ACUTE ONLY): 12 min  Assessment / Plan / Recommendation Clinical Impression  Skilled treatment session focused on addressing dysphagia education with patient and daughter following this mornings objective assessment.  SLP utilized visual, verbal and written instruction for understanding of dysphagia, aspiration and effective compensatory strategies that prevent aspiration, which patient returned demonstration of. Daughter took notes and verbalized understanding of current diet restrictions.  Patient will benefit from home health therapy follow up.     HPI Other Pertinent Information: 81 yo female with hx of breast cancer, right nodule goiter, HLD, afib on chronic anticoagulation, HTN, DM2 on tradjenta and hx of recurrent R sided pleural effusions who presented to the ED with subjectively worsening sob over the past several days per MD note. Pt is s/p thoracentesis 6/16 yielding 800cc of fluid and thoracentesis during this admission.  Chest imaging demonstrated moderate R sided effusion. Pt remained without hypoxia or tachypnea. Swallow evaluation ordered.  Pt reports dysphagia with food/drink over the last few months.  Has some chronic dysphagia due to goiter and voice changes after goiter surgery approximately 40 years ago.  Pt admits to weight loss but denies dysphagia being source. Pt reports frequently choking on bulky items as well as with liquids.   Pt has never had swallow evaluation completed.  CXR showed development of right lobe infiltrate, ? aspiraton.     Pertinent Vitals Pain Assessment: No/denies pain  SLP Plan  Continue with current plan of care    Recommendations Diet recommendations: Dysphagia 1 (puree);Thin liquid Liquids provided via: Cup;Straw Medication Administration: Whole meds with  puree Supervision: Patient able to self feed Compensations: Slow rate;Small sips/bites;Follow solids with liquid;Chin tuck;Effortful swallow Postural Changes and/or Swallow Maneuvers: Seated upright 90 degrees;Upright 30-60 min after meal              Oral Care Recommendations: Oral care BID Follow up Recommendations: 24 hour supervision/assistance;Home health SLP Plan: Continue with current plan of care    GO    Carmelia Roller., CCC-SLP [phone]  [place] 09/28/2014, 4:37 PM

## 2014-09-28 NOTE — Progress Notes (Signed)
MBSS complete. Full report located under chart review in imaging section.  Gunnar Fusi, M.A., CCC-SLP [phone]

## 2014-09-28 NOTE — Progress Notes (Signed)
Ref: Alison Greenland, MD   Subjective:  Good diuresis. Elevated heart rate as off diltiazem. Breathing has improved per patient.  Objective:  Vital Signs in the last 24 hours: Temp:  [98.3 F (36.8 C)-99.2 F (37.3 C)] 99.2 F (37.3 C) (07/21 2005) Pulse Rate:  [74-106] 106 (07/21 2005) Cardiac Rhythm:  [-] Normal sinus rhythm (07/21 1532) Resp:  [18] 18 (07/21 2005) BP: (85-99)/(43-53) 93/53 mmHg (07/21 2005) SpO2:  [97 %-99 %] 97 % (07/21 2005) Weight:  [58.559 kg (129 lb 1.6 oz)] 58.559 kg (129 lb 1.6 oz) (07/21 0431)  Physical Exam: BP Readings from Last 1 Encounters:  09/28/14 93/53    Wt Readings from Last 1 Encounters:  09/28/14 58.559 kg (129 lb 1.6 oz)    Weight change: -1.86 kg (-4 lb 1.6 oz)  HEENT: [place]/AT, Eyes-Brown, PERL, EOMI, Conjunctiva-Pink, Sclera-Non-icteric Neck: No JVD, No bruit, Trachea midline. Lungs:  Clearing, Bilateral. Cardiac:  Irregular rhythm, normal S1 and S2, no S3. II/VI systolic and diastolic murmur. Abdomen:  Soft, non-tender. Extremities:  1 + edema present. No cyanosis. No clubbing. CNS: AxOx3, Cranial nerves grossly intact, moves all 4 extremities. Right handed. Skin: Warm and dry.   Intake/Output from previous day: 07/20 0701 - 07/21 0700 In: 520 [P.O.:120; I.V.:400] Out: 1525 [Urine:1525]    Lab Results: BMET    Component Value Date/Time   NA 141 09/28/2014 0238   NA 140 09/27/2014 0349   NA 139 09/26/2014 2219   NA 143 09/18/2014 0946   NA 142 08/04/2014 1344   NA 141 07/11/2014 1102   K 4.0 09/28/2014 0238   K 3.8 09/27/2014 0349   K 3.7 09/26/2014 2219   K 4.0 09/18/2014 0946   K 4.4 08/04/2014 1344   K 4.1 07/11/2014 1102   CL 109 09/28/2014 0238   CL 108 09/27/2014 0349   CL 108 09/26/2014 2219   CL 107 08/05/2012 1009   CL 108* 06/03/2012 1553   CL 105 04/21/2012 0806   CO2 24 09/28/2014 0238   CO2 23 09/27/2014 0349   CO2 24 09/26/2014 2219   CO2 30* 09/18/2014 0946   CO2 26 08/04/2014 1344   CO2 28  07/11/2014 1102   GLUCOSE 130* 09/28/2014 0238   GLUCOSE 112* 09/27/2014 0349   GLUCOSE 120* 09/26/2014 2219   GLUCOSE 94 09/18/2014 0946   GLUCOSE 108 08/04/2014 1344   GLUCOSE 119 07/11/2014 1102   GLUCOSE 128* 08/05/2012 1009   GLUCOSE 85 06/03/2012 1553   GLUCOSE 152* 04/21/2012 0806   BUN 22* 09/28/2014 0238   BUN 22* 09/27/2014 0349   BUN 22* 09/26/2014 2219   BUN 31.2* 09/18/2014 0946   BUN 45.5* 08/04/2014 1344   BUN 27.8* 07/11/2014 1102   CREATININE 1.50* 09/28/2014 0238   CREATININE 1.49* 09/27/2014 0349   CREATININE 1.50* 09/26/2014 2219   CREATININE 1.7* 09/18/2014 0946   CREATININE 1.8* 08/04/2014 1344   CREATININE 1.4* 07/11/2014 1102   CALCIUM 9.1 09/28/2014 0238   CALCIUM 8.9 09/27/2014 0349   CALCIUM 9.0 09/26/2014 2219   CALCIUM 11.8* 09/18/2014 0946   CALCIUM 11.6* 08/04/2014 1344   CALCIUM 10.7* 07/27/2014 0830   CALCIUM 11.3* 07/11/2014 1102   GFRNONAA 32* 09/28/2014 0238   GFRNONAA 32* 09/27/2014 0349   GFRNONAA 32* 09/26/2014 2219   GFRAA 37* 09/28/2014 0238   GFRAA 37* 09/27/2014 0349   GFRAA 37* 09/26/2014 2219   CBC    Component Value Date/Time   WBC 5.3 09/28/2014 1021   WBC  4.5 09/18/2014 0946   RBC 3.32* 09/28/2014 1021   RBC 3.34* 09/18/2014 0946   RBC 2.80* 01/24/2012 1011   HGB 9.5* 09/28/2014 1021   HGB 9.6* 09/18/2014 0946   HCT 29.1* 09/28/2014 1021   HCT 29.4* 09/18/2014 0946   PLT 290 09/28/2014 1021   PLT 319 09/18/2014 0946   MCV 87.7 09/28/2014 1021   MCV 87.9 09/18/2014 0946   MCH 28.6 09/28/2014 1021   MCH 28.7 09/18/2014 0946   MCHC 32.6 09/28/2014 1021   MCHC 32.7 09/18/2014 0946   RDW 16.9* 09/28/2014 1021   RDW 16.8* 09/18/2014 0946   LYMPHSABS 0.6* 09/19/2014 0750   LYMPHSABS 0.6* 09/18/2014 0946   MONOABS 0.8 09/19/2014 0750   MONOABS 0.6 09/18/2014 0946   EOSABS 0.5 09/19/2014 0750   EOSABS 0.3 09/18/2014 0946   BASOSABS 0.1 09/19/2014 0750   BASOSABS 0.1 09/18/2014 0946   HEPATIC Function  Panel  Recent Labs  09/20/14 0355 09/20/14 1510 09/26/14 2219  PROT 6.1* 6.5 6.0*   HEMOGLOBIN A1C No components found for: HGA1C,  MPG CARDIAC ENZYMES Lab Results  Component Value Date   TROPONINI 0.29* 09/21/2014   TROPONINI 0.30* 09/21/2014   TROPONINI 0.33* 09/20/2014   BNP No results for input(s): PROBNP in the last 8760 hours. TSH  Recent Labs  07/27/14 0515 08/23/14 0822  TSH 1.036 1.115   CHOLESTEROL No results for input(s): CHOL in the last 8760 hours.  Scheduled Meds: . albumin human  12.5 g Intravenous Once  . amiodarone  200 mg Oral BID  . anastrozole  1 mg Oral Q breakfast  . atorvastatin  10 mg Oral q1800  . feeding supplement  1 Container Oral TID BM  . feeding supplement (ENSURE ENLIVE)  237 mL Oral BID BM  . ferrous sulfate  325 mg Oral TID WC  . furosemide  40 mg Oral BID  . insulin aspart  0-15 Units Subcutaneous TID WC  . insulin aspart  0-5 Units Subcutaneous QHS  . linagliptin  5 mg Oral Daily  . metoprolol tartrate  12.5 mg Oral QID  . pantoprazole  40 mg Oral Daily  . polyethylene glycol  17 g Oral Daily   Continuous Infusions: . heparin 1,050 Units/hr (09/28/14 0401)   PRN Meds:.acetaminophen **OR** [DISCONTINUED] acetaminophen, albuterol, bisacodyl, morphine injection, [DISCONTINUED] ondansetron **OR** ondansetron (ZOFRAN) IV  Assessment/Plan: Diastolic heart failure, acute on chronic Paroxysmal Atrial fibrillation Right breast cancer in remission Hyperlipidemia Hypertension DM, II Recurrent pleural effusions Large substernal thyroid goiter Hypertrophic cardiomyopathy with mild LV outflow obstruction Quadricuspid AV without stenosis Dilated LA and RA Small Pericardial effusion Iron deficiency anemia Emphysema Hypoalbuminemia  Add oral amiodarone for heart rate control.    LOS: 7 days    Dixie Dials  MD  09/28/2014, 9:10 PM

## 2014-09-28 NOTE — Progress Notes (Signed)
PROGRESS NOTE  NORMAN BIER EOF:[id number] DOB: [birth year] DOA: 09/19/2014 PCP: Maximino Greenland, MD  Brief Summary  Alison Gross is a 81 y.o. female with a hx of goiter, HLD, afib on chronic anticoagulation, chronic CHF, HTN, DM2 on tradjenta, breast cancer in remission, MGUS, and hx of recurrent R sided pleural effusions who presents to the [facility] ED on 7/12 with worsening sob over the past several days. Pt is s/p thoracentesis 6/16 yielding 800cc. In ED, chest imaging demonstrated moderate R sided effusion. Of note, patient does have a known goiter. Family reported pt had been having more difficulty swallowing foods recently. Essentially, she has had a chronic goiter, part of which was removed in the 1970s (external portion). It is large, intrathoracic and was probably the cause of her chronic right effusion which she tolerated for many years. She has had some marginal enlargement in the last few months which has led to dyspnea requiring thoracentesis, dysphonia, and dysphagia. Ultimately, she needs treatment for her goiter or she will need palliative measures such as pleurex. Options being explored are surgical removal, radiation. As per TCTS, high risk for major surgery and hence plans for right Pleurx catheter placement on 7/20.  STUDIES:  6/16 Rt pleural fluid >> 800 ml, protein 3.2, LDH 49, WBC 614 (84%L), atypical cells present on cytology but were finally reported as reactive mesothelial cells 7/11 PET scan >> moderate/large Rt pleural effusion, massive thyroid goiter 7/12 ADMIT 7/12 CT chest >> third spacing of fluid, Large Rt pleural effusion, very large thyroid, centrilobular emphysema 7/12 Rt thoracentesis with removal of 1200 mL 7/13 developed hypotension and transferred to ICU requiring IVF and vasopressors, Speech assessment >> stage 1 baby food 7/14 weaned off vasopressors 7/14 CT head/neck >> massive Rt thyroid, mod/large Rt effusion 7/14 Echo >>  severe LVH, EF 55 to 25%, grade 2 diastolic dysfx, severe LA dilation, mod RA dilation, small/mod pericardial effusion 7/15: Right IJ TLC removed 7/16: DVT in the RUE, transfer to [facility] for cardiothoracic surgery consultation in hospital, tapering hydrocortisone  Assessment/Plan  Goiter, large and compressing the right lung and likely contributing to her effusion, swallowing and speech difficulties. See above. TSH 1.115 on 6/15 and fT3 and fT4 wnl - Status post remote partial thyroidectomy and RAI in 2015. Reviewed old records and patient had seen Dr. Chalmers Cater, endocrinology in March 2013 - Patient was transferred from [facility] to [facility] on 7/16 for thoracic surgery opinion regarding goiter surgery. - As per Dr. Virgie Dad discussion with radiation oncology-not appropriate for radiation treatment - 2 TCTS surgeons have evaluated patient and determined that she is high risk candidate for major surgery i.e. thyroidectomy.  -right Pleurx catheter placement 7/20 for recurrent symptomatic pleural effusion.  Recurrent right pleural effusion  - etiology unclear -likely secondary to her goiter vs CHF - transudate by Lights Criteria but had WBC 501 - Status post thoracentesis by interventional radiology on 7/12 - Cytology from 6/16 and 7/12 both with reactive mesothelial cells - Pleural fluid Cx NG - Pleurx 7/20. -Dr. Algis Liming discussed with Dr. [name] on 7/19 and he recommended repeating Echo to reassess Pericardial fluid  Acute on Chronic diastolic heart failure - Significantly volume overloaded. Started IV Lasix 7/17-dosed daily based on BMP - Patient has Foleys. Strict intake and output. - daily weight - redose lasix (started 7/17)--has received 4 doses -Neg 10 pounds since admission -further dosing per cardiology  Paroxysmal atrial fibrillation, continue heparin  gtt for now pending possible pleur-x catheter placement or surgery - Will need to transition to warfarin  given kidney function - Patient had A. fib with RVR overnight 7/16 and was started on Cardizem drip and titrate up to 10 mg/h. She reverted to sinus rhythm. Remains in sinus rhythm. Cardiology has changed to oral Cardizem 7/16. 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 The case was discussed by Dr. Sheran Fava with Dr. Wilburn Cornelia from ENT who will follow up as outpatient for evaluation - Speech therapy  - follow-up with ENT in clinic   Dysphagia, likely secondary to large goiter. Got choked up on dysphagia 2 diet. - 7/21 MBS-->recommended dys 2 diet with thin liquid per speech  Hypotension, was likely secondary to intravascular volume depletion/dehydration and relative adrenal insufficiency. She was given IVF and started on stress dose steroids. - Off stress dose steroids since 7/17 - resolved. Now intermittent soft blood pressures may be related to Cardizem and furosemide.  Acute DVT in RUE secondary to central line - Central line removed on 7/15 - Continue heparin gtt and plan to transition to warfarin: Because of acute DVT and renal disease, will not be able to resume a NOAC.  Elevated troponin, chest pain-free,  -EKG demonstrates no acute ST segment elevation or T-wave inversions. She did previously have some T-wave inversions in the lateral leads and some mild ST segment elevations in the anterior leads which are stable.  -Stable elevation due to CKD and effusion. - Cardiology evaluated and performed a stress test on 7/18: Negative  Diabetes mellitus type 2 with well controlled blood sugars,  - A1c 6.2 On 07/27/14 - Continue mod dose SSI - continue Tradjenta  Right-sided breast  cancer in remission, no evidence of recurrence on recent PET scan - Continue arimidex - Dr. Virgie Dad courtesy f/u appreciated.  Hyperlipidemia, stable, continue atorvastatin  CKD stage III-IV, Creatinine clearance less than 30 , creatinine 1.7 - Minimize nephrotoxicity and renally dose medications - Creatinine gradually improving over the last 5 days. - baseline creatinine 1.3-1.6  Hypercalcemia secondary to dehydration and possible thyroid abnormalities (nonparathyroid hypercalcemia), resolved.  - Vitamin D 54, PTHrp wnl, PTH < 10  Iron deficiency anemia superimposed on MGUS. - continue iron supplementation - B12, folate, and TSH wnl - Hemoglobin stable.  Moderate malnutrition - Nutrition  - supplements  Constipation  - Continue miralax - Bisacodyl suppository - Lactulose 30gm once  NSVT 1 on 7/17 - Continue monitoring on telemetry. Try to keep K >4 & magnesium >2.    Diet: Dysphagia 1 Access: PIV IVF: off Proph: Heparin gtt pending determination of procedure  Code Status: full  Family Communication: Discussed with patient's spouse at bedside on 7/17. Disposition Plan: To be determined.    Procedures/Studies: Dg Chest 1 View  09/19/2014   CLINICAL DATA:  Status post right thoracentesis  EXAM: CHEST  1 VIEW  COMPARISON:  09/19/2014  FINDINGS: Negative for pneumothorax following right thoracentesis. Trace right effusion remains. Improved right middle and lower lobe aeration with some residual atelectasis. Large mediastinal mass along the right peritracheal region compatible with a known massive thyroid goiter. Left lung remains clear. Stable cardiomegaly and vascularity. No superimposed edema.  IMPRESSION: Negative for  pneumothorax following right thoracentesis. Decrease in the right effusion. Otherwise stable exam.   Electronically Signed   By: Jerilynn Mages.  Shick M.D.   On: 09/19/2014 16:44   Dg Chest 2 View  09/27/2014   CLINICAL DATA:  Shortness of  breath and cough  EXAM: CHEST  2 VIEW  COMPARISON:  Chest radiograph September 26, 2014; chest CT September 19, 2014  FINDINGS: Pleural effusions bilaterally are stable. There is patchy consolidation in the bases, also stable. No new parenchymal lung opacity identified. The large right-sided thyroid goiter remains with deviation of the lower thoracic trachea toward the left. The heart size is within normal limits. Pulmonary vascularity is within normal limits. There are surgical clips in both axillary regions as well as in the right peritracheal region, stable. There is stable degenerative change in the thoracic spine.  IMPRESSION: No no appreciable change from 1 day prior. Bilateral effusions with bibasilar patchy airspace consolidation remain. This consolidation is felt to be largely due to atelectasis, although superimposed pneumonia must be of concern. No change in cardiac silhouette. Large right lobe thyroid goiter remains, stable. Tracheal deviation is likewise unchanged.   Electronically Signed   By: [name] III M.D.   On: 09/27/2014 07:35   Dg Chest 2 View  09/26/2014   CLINICAL DATA:  81 year old female with pleural effusion  EXAM: CHEST  2 VIEW  COMPARISON:  Chest radiograph dated 09/24/2014 and CT dated 09/19/2014  FINDINGS: Two views of the chest demonstrate emphysematous changes. There are moderate right and small left pleural effusion. There is associated partial compressive atelectasis of the right lower lobe. Superimposed pneumonia is not excluded. There is no pneumothorax. Large right thyroid mass again noted with mass effect and mild deviation of trachea to the left. There is degenerative changes of the spine.  IMPRESSION: No significant interval change in size of the bilateral pleural effusions.  Large right thyroid nodule.   Electronically Signed   By: Anner Crete M.D.   On: 09/26/2014 21:54   Dg Chest 2 View  09/19/2014   CLINICAL DATA:  Increasing shortness of breath. Wheezing. Onset  of symptoms last night.  EXAM: CHEST  2 VIEW  COMPARISON:  CT 09/18/2014.  FINDINGS: RIGHT paratracheal mass compatible with substernal goiter. Moderate RIGHT pleural effusion with compressive atelectasis. These findings appear similar to the CT yesterday. Aortic arch atherosclerosis. Axillary surgical clips bilaterally. Monitoring leads project over the chest. Cardiopericardial silhouette appears unchanged compared to prior chest radiographs. Lung volumes are lower than on previous study 08/24/2014.  IMPRESSION: 1. Moderate RIGHT pleural effusion with compressive/relaxation atelectasis of the RIGHT lower lobe and RIGHT middle lobe. 2. Large RIGHT peritracheal mass compatible with substernal goiter evaluated on prior CT. 3. Lower lung volumes than on prior chest radiographs.   Electronically Signed   By: Dereck Ligas M.D.   On: 09/19/2014 08:11   Ct Soft Tissue Neck Wo Contrast  09/21/2014   CLINICAL DATA:  81 year old female with difficulty swallowing. Initial encounter. Right pleural effusion treated with thoracentesis 2 days ago. History of thyroid goiter. History of treated breast cancer in 2014.  EXAM: CT NECK WITHOUT CONTRAST  TECHNIQUE: Multidetector CT imaging of the neck was performed following the standard protocol without intravenous contrast.  COMPARISON:  PET-CT 09/18/2014, 10/28/2013.  Chest CT 10/28/2013.  FINDINGS: Chronic massive right thyroid lobe enlargement, with the vast majority of the enlarged right lobe occupying the mediastinum and right hemithorax.  Mediastinal involvement by right thyroid goiter seen as far back  as 2004 (with about 7 cm of right thyroid lobe in the mediastinum at that time).  Currently the right lobe extends as far caudally as the right mainstem bronchus and encompasses approximately 8.0 by 9.8 x 15.0 cm (AP by transverse by CC), with an estimated volume of 588 mL. Compare that to a normal splenic volume of no larger than 412 mL.  Chronic heterogeneous density  throughout the enlarged lobe. The left thyroid lobe is also chronically but more modestly enlarged encompassing about 4.3 x 3.1 x 6.4 cm (AP by transverse by CC). Despite the marked thyroid enlargement there is no significant mass effect on the trachea, carina, or right mainstem.  Asymmetry of the larynx suggests right vocal cord paralysis. No laryngeal or pharyngeal soft tissue mass is evident. Negative parapharyngeal, retropharyngeal, and sublingual spaces. Negative non contrast submandibular and parotid glands. No cervical lymphadenopathy identified in the absence of contrast. Negative visualized noncontrast brain parenchyma. Postoperative changes to the globes.  Chronic sphenoid sinus periosteal thickening, but Visualized paranasal sinuses and mastoids are clear. Absent dentition. No acute or suspicious osseous lesion identified. Small sclerotic focus in the right manubrium is stable since 2004.  Right IJ approach central line in place.  Layering moderate to large right pleural effusion and smaller left pleural effusion. Compressive atelectasis in both lungs greater on the right.  IMPRESSION: 1. Massive chronic enlargement of the right thyroid lobe, with the vast majority of the enlarged lobe occupying the mediastinum and right hemithorax as far down as the right mainstem bronchus. See details above. 2. Despite this, no significant mass effect on the airway or trachea. 3. Layering moderate to large right and smaller left pleural effusions with right greater than left pulmonary compressive atelectasis. 4. Suggestion of right vocal cord paralysis. No laryngeal/pharyngeal mass or cervical lymphadenopathy identified in the absence of contrast.   Electronically Signed   By: Genevie Ann M.D.   On: 09/21/2014 10:06   Ct Chest Wo Contrast  09/19/2014   CLINICAL DATA:  Right breast cancer. Increasing shortness of breath.  EXAM: CT CHEST WITHOUT CONTRAST  TECHNIQUE: Multidetector CT imaging of the chest was performed  following the standard protocol without IV contrast.  COMPARISON:  Multiple exams, including 09/18/2014 and 09/19/2014  FINDINGS: Mediastinum/Nodes: Prominent thyroid goiter with massively enlarged right lobe extending into the right hemithorax and measuring approximately 13.9 by 8.0 by 7.1 cm.  Diffuse subcutaneous edema. Aortic arch and branch vessel atherosclerosis. Low-level stranding in the mediastinum with small to moderate pericardial effusion.  Lungs/Pleura: Right pleural effusion occupies greater than 50% of right hemithoracic volume and is associated with passive atelectasis.  Centrilobular emphysema. Old granulomatous disease. Trace left pleural effusion.  Upper abdomen: Stranding in the intra-abdominal adipose tissue.  Musculoskeletal: Thoracic spondylosis.  IMPRESSION: 1. Considerable third spacing of fluid with subcutaneous, mediastinal, and upper abdominal infiltrative edema. 2. Large right and trace left pleural effusions. 3. Very large thyroid goiter, with masslike extension of the right lobe into the right hemithorax as detailed on prior exams. 4. Centrilobular emphysema.   Electronically Signed   By: Van Clines M.D.   On: 09/19/2014 09:57   Nm Myocar Multi W/spect W/wall Motion / Ef  09/25/2014    Blood pressure demonstrated a hypertensive response to exercise.  Horizontal ST segment depression ST segment depression was noted during  stress in the II, III and aVF leads, beginning at 1 minutes of stress,  ending at 6 minutes of stress, and returning to baseline after 5-9 minutes  of recovery.   CLINICAL DATA:  81 year old female with a history of chest pain.  Cardiovascular risk factors include diabetes, hyperlipidemia.  EXAM: MYOCARDIAL IMAGING WITH SPECT (REST AND PHARMACOLOGIC-STRESS)  GATED LEFT VENTRICULAR WALL MOTION STUDY  LEFT VENTRICULAR EJECTION FRACTION  TECHNIQUE: Standard myocardial SPECT imaging was performed after resting intravenous injection of 10 mCi Tc-44msestamibi.  Subsequently, intravenous infusion of Lexiscan was performed under the supervision of the Cardiology staff. At peak effect of the drug, 30 mCi Tc-975mestamibi was injected intravenously and standard myocardial SPECT imaging was performed. Quantitative gated imaging was also performed to evaluate left ventricular wall motion, and estimate left ventricular ejection fraction.  COMPARISON:  None.  FINDINGS: Perfusion: Small fixed defect at the inferior lateral wall on rest and perfusion images. No reversible ischemia identified.  Wall Motion: Normal left ventricular wall motion. No left ventricular dilation.  Left Ventricular Ejection Fraction: 62 %  End diastolic volume 71 ml  End systolic volume 27 ml  IMPRESSION: 1. Small fixed defect at the inferior lateral wall. No reversible ischemia identified.  2. Normal left ventricular wall motion.  3. Left ventricular ejection fraction 62%  4. Low-risk stress test findings*.  *2012 Appropriate Use Criteria for Coronary Revascularization Focused Update: J Am Coll Cardiol. 201610;96(0):[email or web address]   Electronically Signed   By: JaCorrie Mckusick.O.   On: 09/25/2014 14:24    09/25/2014   CLINICAL DATA:  [age]ear old female with a history of chest pain.  Cardiovascular risk factors include diabetes, hyperlipidemia.  EXAM: MYOCARDIAL IMAGING WITH SPECT (REST AND PHARMACOLOGIC-STRESS)  GATED LEFT VENTRICULAR WALL MOTION STUDY  LEFT VENTRICULAR EJECTION FRACTION  TECHNIQUE: Standard myocardial SPECT imaging was performed after resting intravenous injection of 10 mCi Tc-9955mstamibi. Subsequently, intravenous infusion of Lexiscan was performed under the supervision of the Cardiology staff. At peak effect of the drug, 30 mCi Tc-95m31mtamibi was injected intravenously and standard myocardial SPECT imaging was performed. Quantitative gated imaging was also performed to evaluate left ventricular wall motion, and estimate left  ventricular ejection fraction.  COMPARISON:  None.  FINDINGS: Perfusion: Small fixed defect at the inferior lateral wall on rest and perfusion images. No reversible ischemia identified.  Wall Motion: Normal left ventricular wall motion. No left ventricular dilation.  Left Ventricular Ejection Fraction: 62 %  End diastolic volume 71 ml  End systolic volume 27 ml  IMPRESSION: 1. Small fixed defect at the inferior lateral wall. No reversible ischemia identified.  2. Normal left ventricular wall motion.  3. Left ventricular ejection fraction 62%  4. Low-risk stress test findings*.  *2012 Appropriate Use Criteria for Coronary Revascularization Focused Update: J Am Coll Cardiol. 20121191;47(8):295-621tp://content.[email or web address]?[email or web address]   Electronically Signed   By: JaimCorrie Mckusick.   On: 09/25/2014 14:24   Nm Pet Image Restag (ps) Skull Base To Thigh  09/18/2014   CLINICAL DATA:  Subsequent treatment strategy for breast carcinoma.  EXAM: NUCLEAR MEDICINE PET SKULL BASE TO THIGH  TECHNIQUE: 5.0 mCi F-18 FDG was injected intravenously. Full-ring PET imaging was performed from the skull base to thigh after the radiotracer. CT data was obtained and used for attenuation correction and anatomic localization.  FASTING BLOOD GLUCOSE:  Value: 94 mg/dl  COMPARISON:  By 10/28/2013 pleura PET-CT  FINDINGS: NECK  The thyroid gland is massively enlarged with a large portion extending into the right hemi thorax. There is variable metabolic activity scattered throughout this enlarged gland which is to similar prior.  CHEST  There is a moderate-sized right pleural effusion.  No hypermetabolic mediastinal lymph nodes. No hypermetabolic axillary nodes. No hypermetabolic pulmonary nodules.  ABDOMEN/PELVIS  No abnormal hypermetabolic activity within the liver, pancreas, adrenal glands, or spleen. No hypermetabolic lymph nodes in the abdomen or pelvis.  SKELETON  No focal hypermetabolic activity to suggest skeletal  metastasis.  IMPRESSION: 1. No evidence of breast cancer recurrence or metastasis. 2. Moderate to large right pleural effusion. 3. Small free fluid in the pelvis. 4. Massive thyroid goiter extending into the right hemi thorax.   Electronically Signed   By: Suzy Bouchard M.D.   On: 09/18/2014 13:32   Dg Chest Port 1 View  09/28/2014   CLINICAL DATA:  Check chest tube placement  EXAM: PORTABLE CHEST - 1 VIEW  COMPARISON:  09/27/2014  FINDINGS: Cardiac shadow is mildly enlarged but stable. A PleurX catheter is again noted on the right in stable position. Small apical right pneumothorax is not again seen. Large right superior mediastinal mass (goiter) is noted consistent with prior exams. Small pleural effusions are noted bilaterally and stable. No new focal infiltrate is seen. No acute bony abnormality is noted.  IMPRESSION: Stable appearance when compare with the prior exam. Minimal apical pneumothorax remains.   Electronically Signed   By: Inez Catalina M.D.   On: 09/28/2014 07:35   Dg Chest Port 1 View  09/27/2014   CLINICAL DATA:  PleurX catheter insertion.  Right pleural effusion.  EXAM: PORTABLE CHEST - 1 VIEW 12:20 p.m.  COMPARISON:  Chest x-rays dated 09/27/2014 at 6:28 a.m., 09/24/2014 and 09/19/2014  FINDINGS: Right PleurX catheter has been inserted and the right pleural effusion has diminished. Tiny right apical pneumothorax.  Increased left pleural effusion. Heart size and vascularity are normal. Large spur right substernal goiter is unchanged.  IMPRESSION: PleurX catheter appears in good position. Significant decrease in right pleural effusion. Tiny right apical pneumothorax.  Increased small left effusion.   Electronically Signed   By: Lorriane Shire M.D.   On: 09/27/2014 12:40   Dg Chest Port 1 View  09/24/2014   CLINICAL DATA:  Shortness of breath.  Pleural effusion.  EXAM: PORTABLE CHEST - 1 VIEW  COMPARISON:  Multiple prior studies including 09/22/2014  FINDINGS: Right IJ central line has  been removed. Heart is enlarged. Bilateral pleural effusions again noted. There is right basilar opacity, stable in appearance and consistent with atelectasis or infiltrate. Left lower lung opacity obscures the hemidiaphragm and is consistent with atelectasis or infiltrate. Right mediastinal mass again noted and stable.  IMPRESSION: Persistent bilateral lower lobe opacities and pleural effusions.  Removal of right IJ central line.   Electronically Signed   By: Nolon Nations M.D.   On: 09/24/2014 08:48   Dg Chest Port 1 View  09/22/2014   CLINICAL DATA:  Right pleural effusion and shortness of breath. Status post right thoracentesis on 09/19/2014. History of thyroid goiter and breast carcinoma.  EXAM: PORTABLE CHEST - 1 VIEW  COMPARISON:  09/21/2014 and CT of the chest on 09/19/2014.  FINDINGS: Since thoracentesis, there is slight reaccumulation of right pleural fluid with a relatively small effusion identified by chest x-ray. There likely is a small left pleural effusion. Right lower lung atelectasis versus infiltrate present. Stable appearance of jugular central line and large right-sided mediastinal thyroid goiter.  IMPRESSION: Slight reaccumulation of right pleural fluid with relatively small effusion present. There likely is a small left pleural effusion. Right lower lung atelectasis versus infiltrate.   Electronically Signed   By: Jenness Corner.D.  On: 09/22/2014 10:41   Dg Chest Port 1 View  09/21/2014   CLINICAL DATA:  Dyspnea. Pleural effusions. Large substernal goiter.  EXAM: PORTABLE CHEST - 1 VIEW  COMPARISON:  09/20/2014  FINDINGS: Central venous catheter is unchanged with the tip in the superior vena cava above the cavoatrial junction. Again noted is the large right-sided substernal goiter extending into the right hemi thorax. There is new  There is new slight interstitial pulmonary edema. Bilateral pleural effusions have slightly increased. Cardiomegaly is unchanged. Pulmonary vascularity  appears slightly less prominent.  IMPRESSION: New interstitial edema.  Increasing effusions.   Electronically Signed   By: Lorriane Shire M.D.   On: 09/21/2014 07:03   Dg Chest Port 1 View  09/21/2014   CLINICAL DATA:  Central line placement  EXAM: PORTABLE CHEST - 1 VIEW  COMPARISON:  09/20/2014  FINDINGS: Interval placement of a right central venous catheter. Tip is projected over the low SVC region. The course of the catheter is somewhat lateral than would be expected but this is likely due to vascular displacement by a large right peritracheal mass lesion. Small amount of subcutaneous emphysema in the right neck. Persistent cardiac enlargement with mild pulmonary vascular congestion. Bilateral pleural effusions with basilar atelectasis or infiltration bilaterally. Surgical clips in the axilla bilaterally. Calcification of the aorta. No pneumothorax.  IMPRESSION: Right central venous catheter appears to be in adequate position. Persistent large right peritracheal mass lesion. Cardiac enlargement with pulmonary vascular congestion and bilateral pleural effusions with basilar infiltration or atelectasis.   Electronically Signed   By: Lucienne Capers M.D.   On: 09/21/2014 00:30   Dg Chest Port 1 View  09/20/2014   CLINICAL DATA:  81 year old female status post right thoracentesis  EXAM: PORTABLE CHEST - 1 VIEW  COMPARISON:  Prior chest x-ray 09/19/2014  FINDINGS: Stable cardiomegaly. Atherosclerotic calcification again noted in the transverse aorta. Similar appearance of large right paratracheal mass. There may be slight interval reaccumulation of the right-sided pleural effusion. New patchy opacity in the right lung base. Inspiratory volumes are slightly lower. The left lung remains relatively clear. Surgical changes suggest prior bilateral mastectomies. No acute osseous abnormality.  IMPRESSION: 1. Interval development of patchy airspace opacity in the right lung base which may represent atelectasis,  infiltrate or aspiration in the appropriate clinical setting. 2. Perhaps trace interval reaccumulation of right pleural fluid. 3. Stable right paratracheal mass.   Electronically Signed   By: Jacqulynn Cadet M.D.   On: 09/20/2014 14:45   Dg Swallowing Func-speech Pathology  09/28/2014    Objective Swallowing Evaluation:    Patient Details  Name: Alison Gross MRN: [id number] Date of Birth: 1932/09/29  Today's Date: 09/28/2014 Time: SLP Start Time (ACUTE ONLY): 0915-SLP Stop Time (ACUTE ONLY): 7564 SLP Time Calculation (min) (ACUTE ONLY): 12 min  Past Medical History:  Past Medical History  Diagnosis Date  . Hypercholesterolemia     takes Crestor daily  . Thyroid disease     had iodine radiation  . Hypertension     takes Hyzaar daily  . Dysrhythmia     takes Carvedilol daily  . Pneumonia     history of;last time about 4-44yr ago  . GERD (gastroesophageal reflux disease)     takes Protonix daily  . Gastric ulcer   . History of blood transfusion   . History of colon polyps   . Anemia     takes iron pill daily  . Diabetes mellitus without complication  takes Tradjenta daily  . History of radiation therapy 07/12/12-08/26/12    right breast/  . Paroxysmal atrial fibrillation     PCP EKG 09/27/2013: A. Fibrillation.. EKG 09/29/2013: S. Tach.  . Breast cancer 04/12/12    right-pos lymph node/left-DCIS  . Shortness of breath on exertion   . Bruit of left carotid artery   . Acute upper GI bleeding 01/24/2012  . MGUS (monoclonal gammopathy of unknown significance) 04/21/2013  . Osteoporosis 11/29/2013  . Recurrent right pleural effusion 07/26/2014  . Stage III chronic kidney disease 07/26/2014  . Goiter 07/27/2014  . Substernal goiter 12/23/2012  . Pleural effusion, right 12/23/2012  . Type 2 diabetes mellitus with renal manifestations 07/26/2014  . Dysphonia 09/20/2014  . Vocal cord paralysis     Suspected due to massive substernal goiter   Past Surgical History:  Past Surgical History  Procedure Laterality Date  . Carpal tunnel  release      left  . Breast biopsy    . Esophagogastroduodenoscopy  01/24/2012    Procedure: ESOPHAGOGASTRODUODENOSCOPY (EGD);  Surgeon: Jeryl Columbia, MD;   Location: [facility];  Service: Endoscopy;  Laterality: N/A;  . Thyroid goiter removed    . Cyst removed from back of neck    . Knee surgery      right with rods  . Esophagogastroduodenoscopy    . Colonoscopy    . Total mastectomy Bilateral 05/10/2012    Procedure: RIGHT modified mastectomy; LEFT total mastectomy;  Surgeon:  [name], MD;  Location: [facility];  Service: General;  Laterality:  Bilateral;  . Axillary sentinel node biopsy Right 05/10/2012    Procedure: AXILLARY SENTINEL lymph  NODE BIOPSY;  Surgeon: [name], MD;  Location: [facility];  Service: General;  Laterality: Right;   nuclear medicine injection right side  7:00 am   . Abdominal hysterectomy      fibroids, with bilateral SO   HPI:  Other Pertinent Information: 81 yo female with hx of breast cancer, right  nodule goiter, HLD, afib on chronic anticoagulation, HTN, DM2 on tradjenta  and hx of recurrent R sided pleural effusions who presented to the ED with  subjectively worsening sob over the past several days per MD note. Pt is  s/p thoracentesis 6/16 yielding 800cc of fluid and thoracentesis during  this admission.  Chest imaging demonstrated moderate R sided effusion. Pt  remained without hypoxia or tachypnea. Swallow evaluation ordered.  Pt  reports dysphagia with food/drink over the last few months.  Has some  chronic dysphagia due to goiter and voice changes after goiter surgery  approximately 40 years ago.  Pt admits to weight loss but denies dysphagia  being source. Pt reports frequently choking on bulky items as well as with  liquids.   Pt has never had swallow evaluation completed.  CXR showed  development of right lobe infiltrate, ? aspiraton.    No Data Recorded  Assessment / Plan / Recommendation CHL IP CLINICAL IMPRESSIONS 09/28/2014  Therapy Diagnosis (None)  Clinical  Impression Patient with mild oral and moderate pharyngeal motor  based dysphagia, characterized by decreased base of tongue strength,  delayed swallow initiation, hyloaryngeal excursion and boney prominences  from her cervical spine that appear to decrease pharyngeal space.  These  impairments result in penetration and aspiration across consistencies as  well as moderate pharyngeal residue.  Chin tuck posture with small boluses  and use of hard swallows effectively protected airway and prevented  pharyngeal residue.  Recommend  initiation of a Dys.2 texture diet with  thin liquids and strict use of aspiration precautions.  SLP will to  continue to follow.        CHL IP TREATMENT RECOMMENDATION 09/28/2014  Treatment Recommendations Therapy as outlined in treatment plan below     CHL IP DIET RECOMMENDATION 09/28/2014  SLP Diet Recommendations Dysphagia 2 (Fine chop);Thin  Liquid Administration via (None)  Medication Administration Whole meds with puree  Compensations Slow rate;Small sips/bites;Follow solids with liquid;Chin  tuck;Effortful swallow  Postural Changes and/or Swallow Maneuvers (None)     CHL IP OTHER RECOMMENDATIONS 09/28/2014  Recommended Consults (None)  Oral Care Recommendations Oral care BID  Other Recommendations (None)     CHL IP FOLLOW UP RECOMMENDATIONS 09/26/2014  Follow up Recommendations 24 hour supervision/assistance;Home health SLP     CHL IP FREQUENCY AND DURATION 09/28/2014  Speech Therapy Frequency (ACUTE ONLY) min 2x/week  Treatment Duration 1 week     Pertinent Vitals/Pain None    SLP Swallow Goals No flowsheet data found.  No flowsheet data found.    CHL IP REASON FOR REFERRAL 09/28/2014  Reason for Referral Objectively evaluate swallowing function     CHL IP ORAL PHASE 09/28/2014  Lips (None)  Tongue (None)  Mucous membranes (None)  Nutritional status (None)  Other (None)  Oxygen therapy (None)  Oral Phase WFL  Oral - Pudding Teaspoon (None)  Oral - Pudding Cup (None)  Oral - Honey Teaspoon  (None)  Oral - Honey Cup (None)  Oral - Honey Syringe (None)  Oral - Nectar Teaspoon (None)  Oral - Nectar Cup (None)  Oral - Nectar Straw (None)  Oral - Nectar Syringe (None)  Oral - Ice Chips (None)  Oral - Thin Teaspoon (None)  Oral - Thin Cup (None)  Oral - Thin Straw (None)  Oral - Thin Syringe (None)  Oral - Puree (None)  Oral - Mechanical Soft (None)  Oral - Regular (None)  Oral - Multi-consistency (None)  Oral - Pill (None)  Oral Phase - Comment (None)      CHL IP PHARYNGEAL PHASE 09/28/2014  Pharyngeal Phase Impaired  Pharyngeal - Pudding Teaspoon (None)  Penetration/Aspiration details (pudding teaspoon) (None)  Pharyngeal - Pudding Cup (None)  Penetration/Aspiration details (pudding cup) (None)  Pharyngeal - Honey Teaspoon (None)  Penetration/Aspiration details (honey teaspoon) (None)  Pharyngeal - Honey Cup (None)  Penetration/Aspiration details (honey cup) (None)  Pharyngeal - Honey Syringe (None)  Penetration/Aspiration details (honey syringe) (None)  Pharyngeal - Nectar Teaspoon (None)  Penetration/Aspiration details (nectar teaspoon) (None)  Pharyngeal - Nectar Cup (None)  Penetration/Aspiration details (nectar cup) (None)  Pharyngeal - Nectar Straw (None)  Penetration/Aspiration details (nectar straw) (None)  Pharyngeal - Nectar Syringe (None)  Penetration/Aspiration details (nectar syringe) (None)  Pharyngeal - Ice Chips (None)  Penetration/Aspiration details (ice chips) (None)  Pharyngeal - Thin Teaspoon (None)  Penetration/Aspiration details (thin teaspoon) (None)  Pharyngeal - Thin Cup (None)  Penetration/Aspiration details (thin cup) (None)  Pharyngeal - Thin Straw (None)  Penetration/Aspiration details (thin straw) (None)  Pharyngeal - Thin Syringe (None)  Penetration/Aspiration details (thin syringe') (None)  Pharyngeal - Puree (None)  Penetration/Aspiration details (puree) (None)  Pharyngeal - Mechanical Soft (None)  Penetration/Aspiration details (mechanical soft) (None)  Pharyngeal - Regular  (None)  Penetration/Aspiration details (regular) (None)  Pharyngeal - Multi-consistency (None)  Penetration/Aspiration details (multi-consistency) (None)  Pharyngeal - Pill (None)  Penetration/Aspiration details (pill) (None)  Pharyngeal Comment (None)      CHL IP CERVICAL ESOPHAGEAL PHASE 09/28/2014  Cervical Esophageal Phase WFL  Pudding Teaspoon (None)  Pudding Cup (None)  Honey Teaspoon (None)  Honey Cup (None)  Honey Straw (None)  Nectar Teaspoon (None)  Nectar Cup (None)  Nectar Straw (None)  Nectar Sippy Cup (None)  Thin Teaspoon (None)  Thin Cup (None)  Thin Straw (None)  Thin Sippy Cup (None)  Cervical Esophageal Comment (None)    CHL IP GO 09/20/2014  Functional Assessment Tool Used clinical judgement  Functional Limitations Swallowing  Swallow Current Status (S9702) CM  Swallow Goal Status (O3785) CL  Swallow Discharge Status (Y8502) (None)  Motor Speech Current Status (D7412) (None)  Motor Speech Goal Status (I7867) (None)  Motor Speech Goal Status (E7209) (None)  Spoken Language Comprehension Current Status (O7096) (None)  Spoken Language Comprehension Goal Status (G8366) (None)  Spoken Language Comprehension Discharge Status (Q9476) (None)  Spoken Language Expression Current Status (L4650) (None)  Spoken Language Expression Goal Status (P5465) (None)  Spoken Language Expression Discharge Status [phone]) (None)  Attention Current Status (N1700) (None)  Attention Goal Status (F7494) (None)  Attention Discharge Status (W9675) (None)  Memory Current Status (F1638) (None)  Memory Goal Status (G6659) (None)  Memory Discharge Status (D3570) (None)  Voice Current Status (V7793) (None)  Voice Goal Status (J0300) (None)  Voice Discharge Status (P2330) (None)  Other Speech-Language Pathology Functional Limitation [phone]) (None)  Other Speech-Language Pathology Functional Limitation Goal Status (Q3335)  (None)  Other Speech-Language Pathology Functional Limitation Discharge Status  [phone]) (None)    Gunnar Fusi,  M.A., CCC-SLP [phone]        [place] 09/28/2014, 11:11 AM    Dg C-arm 1-60 Min-no Report  09/27/2014   CLINICAL DATA: surgery   C-ARM 1-60 MINUTES  Fluoroscopy was utilized by the requesting physician.  No radiographic  interpretation.    US Thoracentesis Asp Pleural Space W/img Guide  09/19/2014   INDICATION: Breast cancer, dyspnea, recurrent right pleural effusion. Request is made for diagnostic and therapeutic right thoracentesis.  EXAM: ULTRASOUND GUIDED DIAGNOSTIC AND THERAPEUTIC RIGHT THORACENTESIS  COMPARISON:  Prior thoracentesis on 08/24/2014  MEDICATIONS: None  COMPLICATIONS: None immediate  TECHNIQUE: Informed written consent was obtained from the patient after a discussion of the risks, benefits and alternatives to treatment. A timeout was performed prior to the initiation of the procedure.  Initial ultrasound scanning demonstrates a moderate to large right pleural effusion. The lower chest was prepped and draped in the usual sterile fashion. 1% lidocaine was used for local anesthesia.  An ultrasound image was saved for documentation purposes. A 6 Fr Safe-T-Centesis catheter was introduced. The thoracentesis was performed. The catheter was removed and a dressing was applied. The patient tolerated the procedure well without immediate post procedural complication. The patient was escorted to have an upright chest radiograph.  FINDINGS: A total of approximately 1.2 liters of hazy, amber fluid was removed. Requested samples were sent to the laboratory.  IMPRESSION: Successful ultrasound-guided diagnostic and therapeutic right sided thoracentesis yielding 1.2 liters of pleural fluid.  Read by: Rowe Robert, PA-C   Electronically Signed   By: Markus Daft M.D.   On: 09/19/2014 16:31         Subjective: Patient is breathing better. She denies any fevers, chills, chest discomfort, nausea, vomiting, diarrhea, abdominal pain, dysuria, hematuria. No rashes.  Objective: Filed Vitals:    09/27/14 2117 09/27/14 2353 09/28/14 0032 09/28/14 0431  BP: '90/50 85/45 94/50 '$ 99/43  Pulse:  82 79 74  Temp:    98.3 F (36.8 C)  TempSrc:  Oral  Resp:  18  18  Height:      Weight:    58.559 kg (129 lb 1.6 oz)  SpO2:  99%  99%    Intake/Output Summary (Last 24 hours) at 09/28/14 1502 Last data filed at 09/28/14 0900  Gross per 24 hour  Intake    360 ml  Output   1101 ml  Net   -741 ml   Weight change: -1.86 kg (-4 lb 1.6 oz) Exam:   General:  Pt is alert, follows commands appropriately, not in acute distress  HEENT: No icterus, No thrush,  [place]/AT  Cardiovascular: RRR, S1/S2, no rubs, no gallops  Respiratory: Bibasilar crackles but no wheezing. Good air movement.  Abdomen: Soft/+BS, non tender, non distended, no guarding; no hepatosplenomegaly  Extremities: 1+_LE edema, No lymphangitis, No petechiae, No rashes, no synovitis; no cyanosis or clubbing  Data Reviewed: Basic Metabolic Panel:  Recent Labs Lab 09/25/14 0400 09/26/14 0337 09/26/14 2219 09/27/14 0349 09/28/14 0238  NA 142 139 139 140 141  K 3.7 3.9 3.7 3.8 4.0  CL 115* 112* 108 108 109  CO2 20* 21* '24 23 24  '$ GLUCOSE 129* 113* 120* 112* 130*  BUN 29* 23* 22* 22* 22*  CREATININE 1.52* 1.50* 1.50* 1.49* 1.50*  CALCIUM 8.8* 9.1 9.0 8.9 9.1  MG 1.7  --   --   --   --    Liver Function Tests:  Recent Labs Lab 09/26/14 2219  AST 21  ALT 16  ALKPHOS 74  BILITOT 0.7  PROT 6.0*  ALBUMIN 3.0*   No results for input(s): LIPASE, AMYLASE in the last 168 hours. No results for input(s): AMMONIA in the last 168 hours. CBC:  Recent Labs Lab 09/25/14 0400 09/26/14 0337 09/26/14 2219 09/27/14 0349 09/28/14 1021  WBC 6.4 5.9 6.0 5.3 5.3  HGB 8.0* 8.8* 9.4* 8.6* 9.5*  HCT 24.9* 27.6* 28.9* 26.7* 29.1*  MCV 87.4 86.5 86.8 87.0 87.7  PLT 247 256 285 275 290   Cardiac Enzymes: No results for input(s): CKTOTAL, CKMB, CKMBINDEX, TROPONINI in the last 168 hours. BNP: Invalid input(s):  POCBNP CBG:  Recent Labs Lab 09/27/14 1223 09/27/14 1557 09/27/14 2127 09/28/14 0628 09/28/14 1138  GLUCAP 82  82 105* 140* 112* 137*    Recent Results (from the past 240 hour(s))  Body fluid culture     Status: None   Collection Time: 09/19/14  4:19 PM  Result Value Ref Range Status   Specimen Description PLEURAL RIGHT  Final   Special Requests Normal  Final   Gram Stain   Final    CYTOSPIN SLIDE WBC PRESENT,BOTH PMN AND MONONUCLEAR NO ORGANISMS SEEN    Culture   Final    NO GROWTH 3 DAYS Performed at [facility]    Report Status 09/22/2014 FINAL  Final  Culture, blood (routine x 2)     Status: None   Collection Time: 09/20/14  5:45 PM  Result Value Ref Range Status   Specimen Description BLOOD LEFT ARM  Final   Special Requests BOTTLES DRAWN AEROBIC AND ANAEROBIC 5CC  Final   Culture   Final    NO GROWTH 5 DAYS Performed at [facility]    Report Status 09/25/2014 FINAL  Final  Culture, blood (routine x 2)     Status: None   Collection Time: 09/20/14  5:54 PM  Result Value Ref Range Status   Specimen Description BLOOD LEFT HAND  Final   Special Requests BOTTLES DRAWN  AEROBIC ONLY 1.5CC  Final   Culture   Final    NO GROWTH 5 DAYS Performed at [facility]    Report Status 09/25/2014 FINAL  Final  MRSA PCR Screening     Status: None   Collection Time: 09/20/14  6:45 PM  Result Value Ref Range Status   MRSA by PCR NEGATIVE NEGATIVE Final    Comment:        The GeneXpert MRSA Assay (FDA approved for NASAL specimens only), is one component of a comprehensive MRSA colonization surveillance program. It is not intended to diagnose MRSA infection nor to guide or monitor treatment for MRSA infections.   Body fluid culture     Status: None (Preliminary result)   Collection Time: 09/27/14 11:44 AM  Result Value Ref Range Status   Specimen Description FLUID RIGHT PLEURAL  Final   Special Requests PART B  Final   Gram Stain   Final     WBC PRESENT,BOTH PMN AND MONONUCLEAR NO ORGANISMS SEEN CYTOSPIN SMEAR    Culture NO GROWTH < 24 HOURS  Final   Report Status PENDING  Incomplete     Scheduled Meds: . anastrozole  1 mg Oral Q breakfast  . atorvastatin  10 mg Oral q1800  . diltiazem  60 mg Oral 4 times per day  . feeding supplement  1 Container Oral TID BM  . feeding supplement (ENSURE ENLIVE)  237 mL Oral BID BM  . ferrous sulfate  325 mg Oral TID WC  . furosemide  40 mg Oral BID  . insulin aspart  0-15 Units Subcutaneous TID WC  . insulin aspart  0-5 Units Subcutaneous QHS  . linagliptin  5 mg Oral Daily  . metoprolol tartrate  25 mg Oral QID  . pantoprazole  40 mg Oral Daily  . polyethylene glycol  17 g Oral Daily   Continuous Infusions: . heparin 1,050 Units/hr (09/28/14 0401)  . lactated ringers 10 mL/hr at 09/28/14 0548     Jakari Sada, DO  [facility] Pager [phone]  If 7PM-7AM, please contact night-coverage [email or web address] Password [email or web address] 09/28/2014, 3:02 PM   LOS: 7 days

## 2014-09-28 NOTE — Progress Notes (Addendum)
[address]       [place] [address]             [phone]      1 Day Post-Op Procedure(s) (LRB): INSERTION OF RIGHT PLEURAL DRAINAGE CATHETER (Right) Subjective: Feels less SOB  Objective: Vital signs in last 24 hours: Temp:  [97.8 F (36.6 C)-98.7 F (37.1 C)] 98.3 F (36.8 C) (07/21 0431) Pulse Rate:  [74-97] 74 (07/21 0431) Cardiac Rhythm:  [-] Normal sinus rhythm (07/20 1315) Resp:  [11-25] 18 (07/21 0431) BP: (83-145)/(43-72) 99/43 mmHg (07/21 0431) SpO2:  [97 %-100 %] 99 % (07/21 0431) Weight:  [129 lb 1.6 oz (58.559 kg)] 129 lb 1.6 oz (58.559 kg) (07/21 0431)  Hemodynamic parameters for last 24 hours:    Intake/Output from previous day: 07/20 0701 - 07/21 0700 In: 520 [P.O.:120; I.V.:400] Out: 1525 [Urine:1525] Intake/Output this shift:    General appearance: alert, cooperative and no distress Heart: irregularly irregular rhythm Lungs: dim in lower fields pleurx dressing intact without erethema or drainage noted  Lab Results:  Recent Labs  09/26/14 2219 09/27/14 0349  WBC 6.0 5.3  HGB 9.4* 8.6*  HCT 28.9* 26.7*  PLT 285 275   BMET:  Recent Labs  09/27/14 0349 09/28/14 0238  NA 140 141  K 3.8 4.0  CL 108 109  CO2 23 24  GLUCOSE 112* 130*  BUN 22* 22*  CREATININE 1.49* 1.50*  CALCIUM 8.9 9.1    PT/INR:  Recent Labs  09/26/14 2219  LABPROT 15.1  INR 1.17   ABG No results found for: PHART, HCO3, TCO2, ACIDBASEDEF, O2SAT CBG (last 3)   Recent Labs  09/27/14 1557 09/27/14 2127 09/28/14 0628  GLUCAP 105* 140* 112*    Meds Scheduled Meds: . anastrozole  1 mg Oral Q breakfast  . atorvastatin  10 mg Oral q1800  . diltiazem  60 mg Oral 4 times per day  . feeding supplement  1 Container Oral TID BM  . feeding supplement (ENSURE ENLIVE)  237 mL Oral BID BM  . ferrous sulfate  325 mg Oral TID WC  . insulin aspart  0-15 Units Subcutaneous TID WC  . insulin aspart  0-5 Units Subcutaneous QHS  .  linagliptin  5 mg Oral Daily  . metoprolol tartrate  25 mg Oral QID  . pantoprazole  40 mg Oral Daily  . polyethylene glycol  17 g Oral Daily  . sodium chloride  1,000 mL Intravenous Once   Continuous Infusions: . heparin 1,050 Units/hr (09/28/14 0401)  . lactated ringers 10 mL/hr at 09/28/14 0548   PRN Meds:.acetaminophen **OR** [DISCONTINUED] acetaminophen, albuterol, bisacodyl, morphine injection, [DISCONTINUED] ondansetron **OR** ondansetron (ZOFRAN) IV  Xrays Dg Chest 2 View  09/27/2014   CLINICAL DATA:  Shortness of breath and cough  EXAM: CHEST  2 VIEW  COMPARISON:  Chest radiograph September 26, 2014; chest CT September 19, 2014  FINDINGS: Pleural effusions bilaterally are stable. There is patchy consolidation in the bases, also stable. No new parenchymal lung opacity identified. The large right-sided thyroid goiter remains with deviation of the lower thoracic trachea toward the left. The heart size is within normal limits. Pulmonary vascularity is within normal limits. There are surgical clips in both axillary regions as well as in the right peritracheal region, stable. There is stable degenerative change in the thoracic spine.  IMPRESSION: No no appreciable change from 1 day prior. Bilateral effusions with bibasilar patchy airspace consolidation remain. This consolidation is felt  to be largely due to atelectasis, although superimposed pneumonia must be of concern. No change in cardiac silhouette. Large right lobe thyroid goiter remains, stable. Tracheal deviation is likewise unchanged.   Electronically Signed   By: [name] III M.D.   On: 09/27/2014 07:35   Dg Chest 2 View  09/26/2014   CLINICAL DATA:  81 year old female with pleural effusion  EXAM: CHEST  2 VIEW  COMPARISON:  Chest radiograph dated 09/24/2014 and CT dated 09/19/2014  FINDINGS: Two views of the chest demonstrate emphysematous changes. There are moderate right and small left pleural effusion. There is associated partial  compressive atelectasis of the right lower lobe. Superimposed pneumonia is not excluded. There is no pneumothorax. Large right thyroid mass again noted with mass effect and mild deviation of trachea to the left. There is degenerative changes of the spine.  IMPRESSION: No significant interval change in size of the bilateral pleural effusions.  Large right thyroid nodule.   Electronically Signed   By: Anner Crete M.D.   On: 09/26/2014 21:54   Dg Chest Port 1 View  09/28/2014   CLINICAL DATA:  Check chest tube placement  EXAM: PORTABLE CHEST - 1 VIEW  COMPARISON:  09/27/2014  FINDINGS: Cardiac shadow is mildly enlarged but stable. A PleurX catheter is again noted on the right in stable position. Small apical right pneumothorax is not again seen. Large right superior mediastinal mass (goiter) is noted consistent with prior exams. Small pleural effusions are noted bilaterally and stable. No new focal infiltrate is seen. No acute bony abnormality is noted.  IMPRESSION: Stable appearance when compare with the prior exam. Minimal apical pneumothorax remains.   Electronically Signed   By: Inez Catalina M.D.   On: 09/28/2014 07:35   Dg Chest Port 1 View  09/27/2014   CLINICAL DATA:  PleurX catheter insertion.  Right pleural effusion.  EXAM: PORTABLE CHEST - 1 VIEW 12:20 p.m.  COMPARISON:  Chest x-rays dated 09/27/2014 at 6:28 a.m., 09/24/2014 and 09/19/2014  FINDINGS: Right PleurX catheter has been inserted and the right pleural effusion has diminished. Tiny right apical pneumothorax.  Increased left pleural effusion. Heart size and vascularity are normal. Large spur right substernal goiter is unchanged.  IMPRESSION: PleurX catheter appears in good position. Significant decrease in right pleural effusion. Tiny right apical pneumothorax.  Increased small left effusion.   Electronically Signed   By: Lorriane Shire M.D.   On: 09/27/2014 12:40   Dg C-arm 1-60 Min-no Report  09/27/2014   CLINICAL DATA: surgery   C-ARM  1-60 MINUTES  Fluoroscopy was utilized by the requesting physician.  No radiographic  interpretation.     Assessment/Plan: S/P Procedure(s) (LRB): INSERTION OF RIGHT PLEURAL DRAINAGE CATHETER (Right) 1 stable after pleurx catheter placement   LOS: 7 days    GOLD,WAYNE E 09/28/2014   TP pleural fluid <3.0, cytology pending, likely transudate effusion Will need swallow evaluation- evaluation of full esophagus  Not just mouth and neck I have seen and examined [name] and agree with the above assessment  and plan.  [name] MD Beeper [phone] Office [phone] 09/28/2014 9:30 AM

## 2014-09-28 NOTE — Op Note (Signed)
[name], [name] NO.:  [id number]  MEDICAL RECORD NO.:  [id number]  LOCATION:  [id number]                        FACILITY:  [facility]  PHYSICIAN:  Lanelle Bal, MD    DATE OF BIRTH:  [birth year]  DATE OF PROCEDURE:  09/27/2014 DATE OF DISCHARGE:                              OPERATIVE REPORT   PREOPERATIVE DIAGNOSIS:  Recurrent right pleural effusion.  POSTOPERATIVE DIAGNOSIS:  Recurrent right pleural effusion.  SURGICAL PROCEDURE:  Placement of right PleurX catheter with fluoroscopy and ultrasound guidance.  SURGEON:  Lanelle Bal, MD  BRIEF HISTORY:  The patient is a 81 year old female with a history of stage II breast cancer who has had for many years a known large right mediastinal goiter.  She presented with rapidly increasing shortness of breath.  Evaluation with echocardiogram shows evidence of diastolic dysfunction with preserved LV function and normal ejection fraction. The patient has had chronic right pleural effusions and slight left pleural effusion for more than a year with the acute shortness of breath, recently thoracentesis was performed and then subsequently recurrent effusion on the right.  Cytologies were negative for malignancy.  The patient has had significant weight loss over the past 6 months and is thought to be very frail.  So in discussion with the patient and her family, it was elected not to proceed with major surgical resection of the substernal goiter, but placement of PleurX to control the pleural effusion.  The patient agreed and signed informed consent.  DESCRIPTION OF PROCEDURE:  The patient was brought to the operating room after appropriate time-out, including a radiology time-out.  The right chest was prepped with Betadine and draped in sterile manner.  Using the SonoSite ultrasound, the pleural effusion was easily identified on the right.  A 1% lidocaine was infiltrated in the area of placement of the PleurX.  A  16-gauge needle was then introduced into the pleural space under fluoroscopic guidance and a guidewire was placed into the right pleural space.  There was return of straw-colored fluid.  Over this, PleurX catheter was tunneled through the insertion site.  The peel-away sheath was then placed into the right pleural space.  Straw-colored fluid was drawn off and sent for appropriate studies including cytology. The PleurX catheter was placed into the right chest and approximately 1200 mL of straw-colored fluid was removed.  The incisions were closed and PleurX catheter secured in place.  The patient tolerated the procedure without obvious complication under MAC anesthesia and was transferred to the recovery room for postoperative observation.  Blood loss was minimal.  At the completion of procedure, the sponge and needle count was reported as correct.    Lanelle Bal, MD    EG/MEDQ  D:  09/28/2014  T:  09/28/2014  Job:  [id number]

## 2014-09-29 LAB — COMPREHENSIVE METABOLIC PANEL
ALK PHOS: 54 U/L (ref 38–126)
ALT: 12 U/L — ABNORMAL LOW (ref 14–54)
ANION GAP: 9 (ref 5–15)
AST: 16 U/L (ref 15–41)
Albumin: 2.9 g/dL — ABNORMAL LOW (ref 3.5–5.0)
BUN: 21 mg/dL — ABNORMAL HIGH (ref 6–20)
CALCIUM: 9.1 mg/dL (ref 8.9–10.3)
CHLORIDE: 107 mmol/L (ref 101–111)
CO2: 25 mmol/L (ref 22–32)
Creatinine, Ser: 1.59 mg/dL — ABNORMAL HIGH (ref 0.44–1.00)
GFR calc Af Amer: 35 mL/min — ABNORMAL LOW (ref >60)
GFR calc non Af Amer: 30 mL/min — ABNORMAL LOW (ref >60)
Glucose, Bld: 108 mg/dL — ABNORMAL HIGH (ref 65–99)
Potassium: 3.6 mmol/L (ref 3.5–5.1)
Sodium: 141 mmol/L (ref 135–145)
Total Bilirubin: 0.5 mg/dL (ref 0.3–1.2)
Total Protein: 5.7 g/dL — ABNORMAL LOW (ref 6.5–8.1)

## 2014-09-29 LAB — CBC
HEMATOCRIT: 25.7 % — AB (ref 36.0–46.0)
HEMOGLOBIN: 8.3 g/dL — AB (ref 12.0–15.0)
MCH: 27.9 pg (ref 26.0–34.0)
MCHC: 32.3 g/dL (ref 30.0–36.0)
MCV: 86.5 fL (ref 78.0–100.0)
Platelets: 214 10*3/uL (ref 150–400)
RBC: 2.97 MIL/uL — AB (ref 3.87–5.11)
RDW: 16.9 % — ABNORMAL HIGH (ref 11.5–15.5)
WBC: 4.5 10*3/uL (ref 4.0–10.5)

## 2014-09-29 LAB — GLUCOSE, CAPILLARY
GLUCOSE-CAPILLARY: 124 mg/dL — AB (ref 65–99)
GLUCOSE-CAPILLARY: 92 mg/dL (ref 65–99)
Glucose-Capillary: 95 mg/dL (ref 65–99)
Glucose-Capillary: 89 mg/dL (ref 65–99)
Glucose-Capillary: 115 mg/dL — ABNORMAL HIGH (ref 65–99)

## 2014-09-29 LAB — HEPARIN LEVEL (UNFRACTIONATED): Heparin Unfractionated: 0.38 IU/mL (ref 0.30–0.70)

## 2014-09-29 MED ORDER — WARFARIN - PHARMACIST DOSING INPATIENT
Freq: Every day | Status: DC
  Administered 2014-09-29 – 2014-10-01 (×3): 1
  Administered 2014-10-02: 18:00:00

## 2014-09-29 MED ORDER — INSULIN ASPART 100 UNIT/ML ~~LOC~~ SOLN: 0.0000 [IU] | Freq: Three times a day (TID) | SUBCUTANEOUS | Status: DC

## 2014-09-29 MED ORDER — WARFARIN SODIUM 5 MG PO TABS
5.0000 mg | ORAL_TABLET | Freq: Once | ORAL | Status: AC
  Administered 2014-09-29: 5 mg via ORAL
  Filled 2014-09-29: qty 1

## 2014-09-29 MED ORDER — INSULIN ASPART 100 UNIT/ML ~~LOC~~ SOLN: 0.0000 [IU] | Freq: Every day | SUBCUTANEOUS | Status: DC

## 2014-09-29 NOTE — Progress Notes (Signed)
Utilization review completed.  

## 2014-09-29 NOTE — Progress Notes (Addendum)
Speech Language Pathology Treatment: Dysphagia  Patient Details Name: Alison Gross MRN: [id number] DOB: 03-27-1932 Today's Date: 09/29/2014 Time: 6122-4497 SLP Time Calculation (min) (ACUTE ONLY): 8 min  Assessment / Plan / Recommendation Clinical Impression  Treatment focused on dysphagia intervention (no family present). Pt stated chin tuck technique independently although required verbal and visual cueing to time swallow initiation after completion of chin tuck. Swallow was audible which can be indicative of pharyngeal dysfunction. Limited observation as pt stated being sleepy. Pt's diet texture was upgraded then downgraded due to difficulty masticating since initial assessment. She reports she is not experiencing any difficulty manipulating Dys 2 textures. highLung sounds stable according to RN documentation and pt afebrile. ST will continue to treat for independent use of swallow precautions.   HPI Other Pertinent Information: 81 yo female with hx of breast cancer, right nodule goiter, HLD, afib on chronic anticoagulation, HTN, DM2 on tradjenta and hx of recurrent R sided pleural effusions who presented to the ED with subjectively worsening sob over the past several days per MD note. Pt is s/p thoracentesis 6/16 yielding 800cc of fluid and thoracentesis during this admission.  Chest imaging demonstrated moderate R sided effusion. Pt remained without hypoxia or tachypnea. Swallow evaluation ordered.  Pt reports dysphagia with food/drink over the last few months.  Has some chronic dysphagia due to goiter and voice changes after goiter surgery approximately 40 years ago.  Pt admits to weight loss but denies dysphagia being source. Pt reports frequently choking on bulky items as well as with liquids.   Pt has never had swallow evaluation completed.  CXR showed development of right lobe infiltrate, ? aspiraton.     Pertinent Vitals Pain Assessment: No/denies pain  SLP Plan  Continue with current  plan of care    Recommendations Diet recommendations: Dysphagia 2 (fine chop);Thin liquid Liquids provided via: Cup;Straw Medication Administration: Whole meds with puree Supervision: Patient able to self feed Compensations: Chin tuck;Slow rate;Small sips/bites;Follow solids with liquid;Effortful swallow (chin tuck with liquids) Postural Changes and/or Swallow Maneuvers: Seated upright 90 degrees;Upright 30-60 min after meal              Oral Care Recommendations: Oral care BID Plan: Continue with current plan of care    GO     Houston Siren 09/29/2014, 12:29 PM   Orbie Pyo Colvin Caroli.Ed [employer] [phone]

## 2014-09-29 NOTE — Progress Notes (Addendum)
ANTICOAGULATION CONSULT NOTE - Follow Up Consult  Pharmacy Consult for heparin Indication: atrial fibrillation and RUE DVT  No Known Allergies  Patient Measurements: Height: '5\' 5"'$  (165.1 cm) Weight: 129 lb 14.4 oz (58.922 kg) IBW/kg (Calculated) : 57 Heparin Dosing Weight: 58.5 kg  Vital Signs: Temp: 98.6 F (37 C) (07/22 0506) Temp Source: Oral (07/22 0506) BP: 109/53 mmHg (07/22 0506) Pulse Rate: 94 (07/22 0506)  Labs:  Recent Labs  09/26/14 2219  09/27/14 0349 09/28/14 0238 09/28/14 1021 09/29/14 0340  HGB 9.4*  --  8.6*  --  9.5* 8.3*  HCT 28.9*  --  26.7*  --  29.1* 25.7*  PLT 285  --  275  --  290 214  APTT 96*  --   --   --   --   --   LABPROT 15.1  --   --   --   --   --   INR 1.17  --   --   --   --   --   HEPARINUNFRC  --   < > 0.20* 0.31 0.47 0.38  CREATININE 1.50*  --  1.49* 1.50*  --  1.59*  < > = values in this interval not displayed.  Estimated Creatinine Clearance: 25.8 mL/min (by C-G formula based on Cr of 1.59).  Assessment: 81 y/o F POD#2 PleurX catheter placement. Heparin resumed at 1050 units/hr.  HL therapeutic 0.38. Hgb 8.3, plts 214, slightly trending down. New RUE DVT on 07/16. Xarelto PTA. Planning warfarin when able to transition to PO due to current renal function.  Goal of Therapy:  Heparin level 0.3-0.7 units/ml Monitor platelets by anticoagulation protocol: Yes   Plan:  Continue heparin gtt 1050 units/hr Daily HL/CBC F/u warfarin plans  Angela Burke, PharmD Pharmacy Resident Pager: [phone] 09/29/2014,8:56 AM  Addendum:  Pharmacy consulted to start warfarin. Baseline INR 1.17. On Xarelto PTA but switching due to renal function. Will start at warfarin 5 mg. Daily PT/INR, monitor for signs of bleeding. Monitor closely due to po amiodarone.

## 2014-09-29 NOTE — Care Management Note (Signed)
Case Management Note Marvetta Gibbons RN, BSN Unit 2W-Case Manager [phone]  Patient Details  Name: Alison Gross MRN: [id number] Date of Birth: 1932/08/28  Subjective/Objective:    Pt admitted for recurrent pl effusion and DVT                Action/Plan: PTA pt lived at home, NCM to follow for d/c needs- may need [facility]- plan for pleurX cath placement on 09/27/14  Expected Discharge Date:               Expected Discharge Plan:  [place] (pleurx insertion 7/20)  In-House Referral:     Discharge planning Services  CM Consult  Post Acute Care Choice:  Home Health Choice offered to:  Patient, Spouse  DME Arranged:    DME Agency:     HH Arranged:  RN [place] Agency:  [place]  Status of Service:  In process, will continue to follow  Medicare Important Message Given:  Yes-third notification given Date Medicare IM Given:    Medicare IM give by:    Date Additional Medicare IM Given:    Additional Medicare Important Message give by:     If discussed at [place] of Stay Meetings, dates discussed:  09/26/14, 09/28/14  Additional Comments:  09/29/14- f/u done with pt and spouse at bedside regarding [facility] agency of choice- per pt and spouse they would like to use [facility] for [facility] services- referral called to Santiago Glad with [facility]- pt needs HH orders still for HH-RN with instructions for pleurX cath drainage - PleurX cath forms have been started- will need completed prior to discharge and fax to [place] then mail originals to address in packet- CM to f/u on this Monday 10/03/14 prior to pt being discharged. Pt also still needs large box of PleurX drainage kits ordered to take home- Bedside RN aware.   09/27/14- 1430- pleurx cath placed today- signed forms found on shadow chart- spoke with pt/spouse- and daughter Alison Gross at bedside- pt also has another daughter Alison Gross who was not present- per conversation with pt and family plan is for pt to return home- [facility] has  seen pt and will follow once pt returns home- also discussed [place] needs for home regarding PleurX cath drainage needs- list of [facility] agencies for [facility] left with pt/family for review will f/u with them for choice and arrange [place] once orders placed for discharge- PleurX forms started and will fax to [place] once [facility] agency known- will also speak with bedside RN to be sure box of catheter drainage kits is ordered for pt to take home on discharge.  NCM to continue to follow for d/c needs/planning  Dawayne Patricia, RN 09/29/2014, 11:03 AM

## 2014-09-29 NOTE — Progress Notes (Signed)
Ref: SANDERS,ROBYN N, MD   Subjective:  Improved breathing and blood pressure and heart rate. A. Fibrillation continues.  Objective:  Vital Signs in the last 24 hours: Temp:  [98.6 F (37 C)-99.2 F (37.3 C)] 98.9 F (37.2 C) (07/22 1500) Pulse Rate:  [87-125] 87 (07/22 1500) Cardiac Rhythm:  [-] Normal sinus rhythm;Sinus tachycardia (07/22 0944) Resp:  [18] 18 (07/22 1500) BP: (92-111)/(53-64) 111/64 mmHg (07/22 1500) SpO2:  [97 %-100 %] 100 % (07/22 1500) Weight:  [58.922 kg (129 lb 14.4 oz)] 58.922 kg (129 lb 14.4 oz) (07/22 0506)  Physical Exam: BP Readings from Last 1 Encounters:  09/29/14 111/64    Wt Readings from Last 1 Encounters:  09/29/14 58.922 kg (129 lb 14.4 oz)    Weight change: 0.363 kg (12.8 oz)  HEENT: [place]/AT, Eyes-Brown, PERL, EOMI, Conjunctiva-Pink, Sclera-Non-icteric Neck: No JVD, No bruit, Trachea midline. Lungs:  Clearing, with dull bases, Bilateral. Cardiac:  Regular rhythm, normal S1 and S2, no S3. Ii/VI systolic murmur. Abdomen:  Soft, non-tender. Extremities:  1 + edema present. No cyanosis. No clubbing. CNS: AxOx3, Cranial nerves grossly intact, moves all 4 extremities. Right handed. Skin: Warm and dry.   Intake/Output from previous day: 07/21 0701 - 07/22 0700 In: 480 [P.O.:480] Out: 1401 [Urine:650; Stool:1; Chest Tube:750]    Lab Results: BMET    Component Value Date/Time   NA 141 09/29/2014 0340   NA 141 09/28/2014 0238   NA 140 09/27/2014 0349   NA 143 09/18/2014 0946   NA 142 08/04/2014 1344   NA 141 07/11/2014 1102   K 3.6 09/29/2014 0340   K 4.0 09/28/2014 0238   K 3.8 09/27/2014 0349   K 4.0 09/18/2014 0946   K 4.4 08/04/2014 1344   K 4.1 07/11/2014 1102   CL 107 09/29/2014 0340   CL 109 09/28/2014 0238   CL 108 09/27/2014 0349   CL 107 08/05/2012 1009   CL 108* 06/03/2012 1553   CL 105 04/21/2012 0806   CO2 25 09/29/2014 0340   CO2 24 09/28/2014 0238   CO2 23 09/27/2014 0349   CO2 30* 09/18/2014 0946   CO2 26  08/04/2014 1344   CO2 28 07/11/2014 1102   GLUCOSE 108* 09/29/2014 0340   GLUCOSE 130* 09/28/2014 0238   GLUCOSE 112* 09/27/2014 0349   GLUCOSE 94 09/18/2014 0946   GLUCOSE 108 08/04/2014 1344   GLUCOSE 119 07/11/2014 1102   GLUCOSE 128* 08/05/2012 1009   GLUCOSE 85 06/03/2012 1553   GLUCOSE 152* 04/21/2012 0806   BUN 21* 09/29/2014 0340   BUN 22* 09/28/2014 0238   BUN 22* 09/27/2014 0349   BUN 31.2* 09/18/2014 0946   BUN 45.5* 08/04/2014 1344   BUN 27.8* 07/11/2014 1102   CREATININE 1.59* 09/29/2014 0340   CREATININE 1.50* 09/28/2014 0238   CREATININE 1.49* 09/27/2014 0349   CREATININE 1.7* 09/18/2014 0946   CREATININE 1.8* 08/04/2014 1344   CREATININE 1.4* 07/11/2014 1102   CALCIUM 9.1 09/29/2014 0340   CALCIUM 9.1 09/28/2014 0238   CALCIUM 8.9 09/27/2014 0349   CALCIUM 11.8* 09/18/2014 0946   CALCIUM 11.6* 08/04/2014 1344   CALCIUM 10.7* 07/27/2014 0830   CALCIUM 11.3* 07/11/2014 1102   GFRNONAA 30* 09/29/2014 0340   GFRNONAA 32* 09/28/2014 0238   GFRNONAA 32* 09/27/2014 0349   GFRAA 35* 09/29/2014 0340   GFRAA 37* 09/28/2014 0238   GFRAA 37* 09/27/2014 0349   CBC    Component Value Date/Time   WBC 4.5 09/29/2014 0340   WBC  4.5 09/18/2014 0946   RBC 2.97* 09/29/2014 0340   RBC 3.34* 09/18/2014 0946   RBC 2.80* 01/24/2012 1011   HGB 8.3* 09/29/2014 0340   HGB 9.6* 09/18/2014 0946   HCT 25.7* 09/29/2014 0340   HCT 29.4* 09/18/2014 0946   PLT 214 09/29/2014 0340   PLT 319 09/18/2014 0946   MCV 86.5 09/29/2014 0340   MCV 87.9 09/18/2014 0946   MCH 27.9 09/29/2014 0340   MCH 28.7 09/18/2014 0946   MCHC 32.3 09/29/2014 0340   MCHC 32.7 09/18/2014 0946   RDW 16.9* 09/29/2014 0340   RDW 16.8* 09/18/2014 0946   LYMPHSABS 0.6* 09/19/2014 0750   LYMPHSABS 0.6* 09/18/2014 0946   MONOABS 0.8 09/19/2014 0750   MONOABS 0.6 09/18/2014 0946   EOSABS 0.5 09/19/2014 0750   EOSABS 0.3 09/18/2014 0946   BASOSABS 0.1 09/19/2014 0750   BASOSABS 0.1 09/18/2014 0946    HEPATIC Function Panel  Recent Labs  09/20/14 1510 09/26/14 2219 09/29/14 0340  PROT 6.5 6.0* 5.7*   HEMOGLOBIN A1C No components found for: HGA1C,  MPG CARDIAC ENZYMES Lab Results  Component Value Date   TROPONINI 0.29* 09/21/2014   TROPONINI 0.30* 09/21/2014   TROPONINI 0.33* 09/20/2014   BNP No results for input(s): PROBNP in the last 8760 hours. TSH  Recent Labs  07/27/14 0515 08/23/14 0822  TSH 1.036 1.115   CHOLESTEROL No results for input(s): CHOL in the last 8760 hours.  Scheduled Meds: . amiodarone  200 mg Oral BID  . anastrozole  1 mg Oral Q breakfast  . atorvastatin  10 mg Oral q1800  . feeding supplement  1 Container Oral TID BM  . feeding supplement (ENSURE ENLIVE)  237 mL Oral BID BM  . ferrous sulfate  325 mg Oral TID WC  . insulin aspart  0-5 Units Subcutaneous QHS  . insulin aspart  0-9 Units Subcutaneous TID WC  . linagliptin  5 mg Oral Daily  . metoprolol tartrate  12.5 mg Oral QID  . pantoprazole  40 mg Oral Daily  . polyethylene glycol  17 g Oral Daily  . Warfarin - Pharmacist Dosing Inpatient   Does not apply q1800   Continuous Infusions: . heparin 1,050 Units/hr (09/29/14 0411)   PRN Meds:.acetaminophen **OR** [DISCONTINUED] acetaminophen, albuterol, bisacodyl, morphine injection, [DISCONTINUED] ondansetron **OR** ondansetron (ZOFRAN) IV  Assessment/Plan: Diastolic heart failure, acute on chronic Paroxysmal Atrial fibrillation Right breast cancer in remission Hyperlipidemia Hypertension DM, II Recurrent pleural effusions Large substernal thyroid goiter Hypertrophic cardiomyopathy with mild LV outflow obstruction Quadricuspid AV without stenosis Dilated LA and RA Small Pericardial effusion Iron deficiency anemia Emphysema Hypoalbuminemia  Continue amiodarone.    LOS: 8 days    Dixie Dials  MD  09/29/2014, 8:03 PM

## 2014-09-29 NOTE — Progress Notes (Addendum)
PROGRESS NOTE  KENDELL GAMMON UKG:[id number] DOB: 09-Apr-1932 DOA: 09/19/2014 PCP: Maximino Greenland, MD  Brief Summary  LOUIS GAW is a 81 y.o. female with a hx of goiter, HLD, afib on chronic anticoagulation, chronic CHF, HTN, DM2 on tradjenta, breast cancer in remission, MGUS, and hx of recurrent R sided pleural effusions who presents to the [place] ED on 7/12 with worsening sob over the past several days. Pt is s/p thoracentesis 6/16 yielding 800cc. In ED, chest imaging demonstrated moderate R sided effusion. Of note, patient does have a known goiter. Family reported pt had been having more difficulty swallowing foods recently. Essentially, she has had a chronic goiter, part of which was removed in the 1970s (external portion). It is large, intrathoracic and was probably the cause of her chronic right effusion which she tolerated for many years. She has had some marginal enlargement in the last few months which has led to dyspnea requiring thoracentesis, dysphonia, and dysphagia. Ultimately, she needs treatment for her goiter or she will need palliative measures such as pleurex. Options being explored are surgical removal, radiation. As per TCTS, high risk for major surgery and hence plans for right Pleurx catheter placement on 7/20.  STUDIES:  6/16 Rt pleural fluid >> 800 ml, protein 3.2, LDH 49, WBC 614 (84%L), atypical cells present on cytology but were finally reported as reactive mesothelial cells 7/11 PET scan >> moderate/large Rt pleural effusion, massive thyroid goiter 7/12 ADMIT 7/12 CT chest >> third spacing of fluid, Large Rt pleural effusion, very large thyroid, centrilobular emphysema 7/12 Rt thoracentesis with removal of 1200 mL 7/13 developed hypotension and transferred to ICU requiring IVF and vasopressors, Speech assessment >> stage 1 baby food 7/14 weaned off vasopressors 7/14 CT head/neck >> massive Rt thyroid, mod/large Rt effusion 7/14 Echo >>  severe LVH, EF 55 to 76%, grade 2 diastolic dysfx, severe LA dilation, mod RA dilation, small/mod pericardial effusion 7/15: Right IJ TLC removed 7/16: DVT in the RUE, transfer to [facility] for cardiothoracic surgery consultation in hospital, tapering hydrocortisone 7/20: R-Pleurx placed 7/22: start warfarin  Assessment/Plan  Goiter, large and compressing the right lung and likely contributing to her effusion, swallowing and speech difficulties. See above. TSH 1.115 on 6/15 and fT3 and fT4 wnl - Status post remote partial thyroidectomy and RAI in 2015. Reviewed old records and patient had seen Dr. Chalmers Cater, endocrinology in March 2013 - Patient was transferred from [facility] to [facility] on 7/16 for thoracic surgery opinion regarding goiter surgery. - As per Dr. Virgie Dad discussion with radiation oncology-not appropriate for radiation treatment - 2 TCTS surgeons have evaluated patient and determined that she is high risk candidate for major surgery i.e. thyroidectomy.  -right Pleurx catheter placement 7/20 for recurrent symptomatic pleural effusion.  Recurrent right pleural effusion  - etiology unclear -likely secondary to her goiter vs CHF - transudate by Lights Criteria but had WBC 501 - Status post thoracentesis by interventional radiology on 7/12 - Cytology from 6/16 and 7/12 both with reactive mesothelial cells - Pleural fluid Cx NG - Pleurx 7/20. -Dr. Algis Liming discussed with Dr. [name] on 7/19 and he recommended repeating Echo to reassess Pericardial fluid -750cc drainage from Pleurx since placement on 09/27/14  Acute on Chronic diastolic heart failure - Significantly volume overloaded. Started IV Lasix 7/17-dosed daily based on BMP - Patient has Foleys. Strict intake and output. - daily weight - lasix (started 7/17)--has received 4 doses -Neg 10  pounds since admission -further dosing per cardiology-->2 po doses lasix 7/21-7/22  Paroxysmal atrial fibrillation,  -  Patient had A. fib with RVR overnight 7/16 and was started on Cardizem drip and titrate up to 10 mg/h. She reverted to sinus rhythm. Remains in sinus rhythm. Cardiology has changed to oral Cardizem 7/16. -d/c diltiazem as pt has pt only received one dose in past 48 hours due to soft BPs -decrease metoprolol dosing due to soft BP -Due to the patient's EKG stage IV, unable to use factor Xa inhibitors. We'll need to continue heparin bridge with initiation of warfarin. 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 The case was discussed by Dr. Sheran Fava with Dr. Wilburn Cornelia from ENT who will follow up as outpatient for evaluation - Speech therapy  - follow-up with ENT in clinic   Dysphagia, likely secondary to large goiter. Got choked up on dysphagia 2 diet. - 7/21 MBS-->recommended dys 2 diet with thin liquid per speech  Hypotension, was likely secondary to intravascular volume depletion/dehydration and relative adrenal insufficiency. She was given IVF and started on stress dose steroids. - Off stress dose steroids since 7/17 - resolved. Now intermittent soft blood pressures may be related to Cardizem and furosemide.  Acute DVT in RUE secondary to central line - Central line removed on 7/15 - 7/22-start warfarin with heparin bridge  Elevated troponin, chest pain-free,  -EKG demonstrates no acute ST segment elevation or T-wave inversions. She did previously have some T-wave inversions in the lateral leads and some mild ST segment elevations in the anterior leads which are stable.  -Stable elevation due to CKD and effusion. - Cardiology evaluated and performed a stress test on 7/18: Negative  Diabetes mellitus type 2 with well controlled blood sugars,  - A1c 6.2 On 07/27/14 - switch to sensitive dose SSI - continue Tradjenta  Right-sided breast cancer in remission,  -no  evidence of recurrence on recent PET scan--09/18/14 - Continue arimidex - Dr. Virgie Dad courtesy f/u appreciated.  Hyperlipidemia, stable, continue atorvastatin  CKD stage III-IV,  -Creatinine clearance less than 30  - Minimize nephrotoxicity and renally dose medications - Creatinine gradually improving over the last 5 days. - baseline creatinine 1.3-1.6  Hypercalcemia secondary to dehydration and possible thyroid abnormalities (nonparathyroid hypercalcemia), resolved.  - Vitamin D 54, PTHrp wnl, PTH < 10  Iron deficiency anemia superimposed on MGUS. - continue iron supplementation - B12, folate, and TSH wnl - Hemoglobin stable.  Moderate malnutrition - Nutrition  - supplements  Constipation  - Continue miralax - Bisacodyl suppository - Lactulose 30gm once  NSVT 1 on 7/17 - Continue monitoring on telemetry. Try to keep K >4 & magnesium >2.    Diet: Dysphagia 2 Access: PIV IVF: off Proph: Heparin gtt   Code Status: full  Family Communication: Discussed with patient's spouse at bedside on 7/17. Disposition Plan: To be determined.     Procedures/Studies: Dg Chest 1 View  09/19/2014   CLINICAL DATA:  Status post right thoracentesis  EXAM: CHEST  1 VIEW  COMPARISON:  09/19/2014  FINDINGS: Negative for pneumothorax following right thoracentesis. Trace right effusion remains. Improved right middle and lower lobe aeration with some residual atelectasis. Large mediastinal mass along the right peritracheal region compatible with a known massive thyroid goiter. Left lung remains clear. Stable cardiomegaly and vascularity. No superimposed edema.  IMPRESSION: Negative for pneumothorax following right thoracentesis. Decrease in the right  effusion. Otherwise stable exam.   Electronically Signed   By: Jerilynn Mages.  Shick M.D.   On: 09/19/2014 16:44   Dg Chest 2 View  09/27/2014   CLINICAL DATA:  Shortness of breath and cough  EXAM: CHEST  2 VIEW  COMPARISON:  Chest  radiograph September 26, 2014; chest CT September 19, 2014  FINDINGS: Pleural effusions bilaterally are stable. There is patchy consolidation in the bases, also stable. No new parenchymal lung opacity identified. The large right-sided thyroid goiter remains with deviation of the lower thoracic trachea toward the left. The heart size is within normal limits. Pulmonary vascularity is within normal limits. There are surgical clips in both axillary regions as well as in the right peritracheal region, stable. There is stable degenerative change in the thoracic spine.  IMPRESSION: No no appreciable change from 1 day prior. Bilateral effusions with bibasilar patchy airspace consolidation remain. This consolidation is felt to be largely due to atelectasis, although superimposed pneumonia must be of concern. No change in cardiac silhouette. Large right lobe thyroid goiter remains, stable. Tracheal deviation is likewise unchanged.   Electronically Signed   By: [name] III M.D.   On: 09/27/2014 07:35   Dg Chest 2 View  09/26/2014   CLINICAL DATA:  82 year old female with pleural effusion  EXAM: CHEST  2 VIEW  COMPARISON:  Chest radiograph dated 09/24/2014 and CT dated 09/19/2014  FINDINGS: Two views of the chest demonstrate emphysematous changes. There are moderate right and small left pleural effusion. There is associated partial compressive atelectasis of the right lower lobe. Superimposed pneumonia is not excluded. There is no pneumothorax. Large right thyroid mass again noted with mass effect and mild deviation of trachea to the left. There is degenerative changes of the spine.  IMPRESSION: No significant interval change in size of the bilateral pleural effusions.  Large right thyroid nodule.   Electronically Signed   By: Anner Crete M.D.   On: 09/26/2014 21:54   Dg Chest 2 View  09/19/2014   CLINICAL DATA:  Increasing shortness of breath. Wheezing. Onset of symptoms last night.  EXAM: CHEST  2 VIEW  COMPARISON:   CT 09/18/2014.  FINDINGS: RIGHT paratracheal mass compatible with substernal goiter. Moderate RIGHT pleural effusion with compressive atelectasis. These findings appear similar to the CT yesterday. Aortic arch atherosclerosis. Axillary surgical clips bilaterally. Monitoring leads project over the chest. Cardiopericardial silhouette appears unchanged compared to prior chest radiographs. Lung volumes are lower than on previous study 08/24/2014.  IMPRESSION: 1. Moderate RIGHT pleural effusion with compressive/relaxation atelectasis of the RIGHT lower lobe and RIGHT middle lobe. 2. Large RIGHT peritracheal mass compatible with substernal goiter evaluated on prior CT. 3. Lower lung volumes than on prior chest radiographs.   Electronically Signed   By: Dereck Ligas M.D.   On: 09/19/2014 08:11   Ct Soft Tissue Neck Wo Contrast  09/21/2014   CLINICAL DATA:  81 year old female with difficulty swallowing. Initial encounter. Right pleural effusion treated with thoracentesis 2 days ago. History of thyroid goiter. History of treated breast cancer in 2014.  EXAM: CT NECK WITHOUT CONTRAST  TECHNIQUE: Multidetector CT imaging of the neck was performed following the standard protocol without intravenous contrast.  COMPARISON:  PET-CT 09/18/2014, 10/28/2013.  Chest CT 10/28/2013.  FINDINGS: Chronic massive right thyroid lobe enlargement, with the vast majority of the enlarged right lobe occupying the mediastinum and right hemithorax.  Mediastinal involvement by right thyroid goiter seen as far back as 2004 (with about 7 cm of right  thyroid lobe in the mediastinum at that time).  Currently the right lobe extends as far caudally as the right mainstem bronchus and encompasses approximately 8.0 by 9.8 x 15.0 cm (AP by transverse by CC), with an estimated volume of 588 mL. Compare that to a normal splenic volume of no larger than 412 mL.  Chronic heterogeneous density throughout the enlarged lobe. The left thyroid lobe is also  chronically but more modestly enlarged encompassing about 4.3 x 3.1 x 6.4 cm (AP by transverse by CC). Despite the marked thyroid enlargement there is no significant mass effect on the trachea, carina, or right mainstem.  Asymmetry of the larynx suggests right vocal cord paralysis. No laryngeal or pharyngeal soft tissue mass is evident. Negative parapharyngeal, retropharyngeal, and sublingual spaces. Negative non contrast submandibular and parotid glands. No cervical lymphadenopathy identified in the absence of contrast. Negative visualized noncontrast brain parenchyma. Postoperative changes to the globes.  Chronic sphenoid sinus periosteal thickening, but Visualized paranasal sinuses and mastoids are clear. Absent dentition. No acute or suspicious osseous lesion identified. Small sclerotic focus in the right manubrium is stable since 2004.  Right IJ approach central line in place.  Layering moderate to large right pleural effusion and smaller left pleural effusion. Compressive atelectasis in both lungs greater on the right.  IMPRESSION: 1. Massive chronic enlargement of the right thyroid lobe, with the vast majority of the enlarged lobe occupying the mediastinum and right hemithorax as far down as the right mainstem bronchus. See details above. 2. Despite this, no significant mass effect on the airway or trachea. 3. Layering moderate to large right and smaller left pleural effusions with right greater than left pulmonary compressive atelectasis. 4. Suggestion of right vocal cord paralysis. No laryngeal/pharyngeal mass or cervical lymphadenopathy identified in the absence of contrast.   Electronically Signed   By: Genevie Ann M.D.   On: 09/21/2014 10:06   Ct Chest Wo Contrast  09/19/2014   CLINICAL DATA:  Right breast cancer. Increasing shortness of breath.  EXAM: CT CHEST WITHOUT CONTRAST  TECHNIQUE: Multidetector CT imaging of the chest was performed following the standard protocol without IV contrast.  COMPARISON:   Multiple exams, including 09/18/2014 and 09/19/2014  FINDINGS: Mediastinum/Nodes: Prominent thyroid goiter with massively enlarged right lobe extending into the right hemithorax and measuring approximately 13.9 by 8.0 by 7.1 cm.  Diffuse subcutaneous edema. Aortic arch and branch vessel atherosclerosis. Low-level stranding in the mediastinum with small to moderate pericardial effusion.  Lungs/Pleura: Right pleural effusion occupies greater than 50% of right hemithoracic volume and is associated with passive atelectasis.  Centrilobular emphysema. Old granulomatous disease. Trace left pleural effusion.  Upper abdomen: Stranding in the intra-abdominal adipose tissue.  Musculoskeletal: Thoracic spondylosis.  IMPRESSION: 1. Considerable third spacing of fluid with subcutaneous, mediastinal, and upper abdominal infiltrative edema. 2. Large right and trace left pleural effusions. 3. Very large thyroid goiter, with masslike extension of the right lobe into the right hemithorax as detailed on prior exams. 4. Centrilobular emphysema.   Electronically Signed   By: Van Clines M.D.   On: 09/19/2014 09:57   Nm Myocar Multi W/spect W/wall Motion / Ef  09/25/2014    Blood pressure demonstrated a hypertensive response to exercise.  Horizontal ST segment depression ST segment depression was noted during  stress in the II, III and aVF leads, beginning at 1 minutes of stress,  ending at 6 minutes of stress, and returning to baseline after 5-9 minutes  of recovery.   CLINICAL DATA:  81 year old female with a history of chest pain.  Cardiovascular risk factors include diabetes, hyperlipidemia.  EXAM: MYOCARDIAL IMAGING WITH SPECT (REST AND PHARMACOLOGIC-STRESS)  GATED LEFT VENTRICULAR WALL MOTION STUDY  LEFT VENTRICULAR EJECTION FRACTION  TECHNIQUE: Standard myocardial SPECT imaging was performed after resting intravenous injection of 10 mCi Tc-54msestamibi. Subsequently, intravenous infusion of Lexiscan was performed  under the supervision of the Cardiology staff. At peak effect of the drug, 30 mCi Tc-939mestamibi was injected intravenously and standard myocardial SPECT imaging was performed. Quantitative gated imaging was also performed to evaluate left ventricular wall motion, and estimate left ventricular ejection fraction.  COMPARISON:  None.  FINDINGS: Perfusion: Small fixed defect at the inferior lateral wall on rest and perfusion images. No reversible ischemia identified.  Wall Motion: Normal left ventricular wall motion. No left ventricular dilation.  Left Ventricular Ejection Fraction: 62 %  End diastolic volume 71 ml  End systolic volume 27 ml  IMPRESSION: 1. Small fixed defect at the inferior lateral wall. No reversible ischemia identified.  2. Normal left ventricular wall motion.  3. Left ventricular ejection fraction 62%  4. Low-risk stress test findings*.  *2012 Appropriate Use Criteria for Coronary Revascularization Focused Update: J Am Coll Cardiol. 204742;59(5):638-756http://content.onairportbarriers.comspx?articleid=1201161   Electronically Signed   By: JaCorrie Mckusick.O.   On: 09/25/2014 14:24    09/25/2014   CLINICAL DATA:  [age]ear old female with a history of chest pain.  Cardiovascular risk factors include diabetes, hyperlipidemia.  EXAM: MYOCARDIAL IMAGING WITH SPECT (REST AND PHARMACOLOGIC-STRESS)  GATED LEFT VENTRICULAR WALL MOTION STUDY  LEFT VENTRICULAR EJECTION FRACTION  TECHNIQUE: Standard myocardial SPECT imaging was performed after resting intravenous injection of 10 mCi [id number]. Subsequently, intravenous infusion of Lexiscan was performed under the supervision of the Cardiology staff. At peak effect of the drug, 30 mCi Tc-75m109mtamibi was injected intravenously and standard myocardial SPECT imaging was performed. Quantitative gated imaging was also performed to evaluate left ventricular wall motion, and estimate left ventricular ejection fraction.  COMPARISON:  None.  FINDINGS:  Perfusion: Small fixed defect at the inferior lateral wall on rest and perfusion images. No reversible ischemia identified.  Wall Motion: Normal left ventricular wall motion. No left ventricular dilation.  Left Ventricular Ejection Fraction: 62 %  End diastolic volume 71 ml  End systolic volume 27 ml  IMPRESSION: 1. Small fixed defect at the inferior lateral wall. No reversible ischemia identified.  2. Normal left ventricular wall motion.  3. Left ventricular ejection fraction 62%  4. Low-risk stress test findings*.  *2012 Appropriate Use Criteria for Coronary Revascularization Focused Update: J Am Coll Cardiol. 20124332;95(1):884-166tp://content.onliairportbarriers.comx?articleid=[email or web address]   Electronically Signed   By: JaimCorrie Mckusick.   On: 09/25/2014 14:24   Nm Pet Image Restag (ps) Skull Base To Thigh  09/18/2014   CLINICAL DATA:  Subsequent treatment strategy for breast carcinoma.  EXAM: NUCLEAR MEDICINE PET SKULL BASE TO THIGH  TECHNIQUE: 5.0 mCi F-18 FDG was injected intravenously. Full-ring PET imaging was performed from the skull base to thigh after the radiotracer. CT data was obtained and used for attenuation correction and anatomic localization.  FASTING BLOOD GLUCOSE:  Value: 94 mg/dl  COMPARISON:  By 10/28/2013 pleura PET-CT  FINDINGS: NECK  The thyroid gland is massively enlarged with a large portion extending into the right hemi thorax. There is variable metabolic activity scattered throughout this enlarged gland which is to similar prior.  CHEST  There is a moderate-sized right pleural effusion. No hypermetabolic mediastinal lymph nodes. No hypermetabolic  axillary nodes. No hypermetabolic pulmonary nodules.  ABDOMEN/PELVIS  No abnormal hypermetabolic activity within the liver, pancreas, adrenal glands, or spleen. No hypermetabolic lymph nodes in the abdomen or pelvis.  SKELETON  No focal hypermetabolic activity to suggest skeletal metastasis.  IMPRESSION: 1. No evidence of breast cancer  recurrence or metastasis. 2. Moderate to large right pleural effusion. 3. Small free fluid in the pelvis. 4. Massive thyroid goiter extending into the right hemi thorax.   Electronically Signed   By: Suzy Bouchard M.D.   On: 09/18/2014 13:32   Dg Chest Port 1 View  09/28/2014   CLINICAL DATA:  Check chest tube placement  EXAM: PORTABLE CHEST - 1 VIEW  COMPARISON:  09/27/2014  FINDINGS: Cardiac shadow is mildly enlarged but stable. A PleurX catheter is again noted on the right in stable position. Small apical right pneumothorax is not again seen. Large right superior mediastinal mass (goiter) is noted consistent with prior exams. Small pleural effusions are noted bilaterally and stable. No new focal infiltrate is seen. No acute bony abnormality is noted.  IMPRESSION: Stable appearance when compare with the prior exam. Minimal apical pneumothorax remains.   Electronically Signed   By: Inez Catalina M.D.   On: 09/28/2014 07:35   Dg Chest Port 1 View  09/27/2014   CLINICAL DATA:  PleurX catheter insertion.  Right pleural effusion.  EXAM: PORTABLE CHEST - 1 VIEW 12:20 p.m.  COMPARISON:  Chest x-rays dated 09/27/2014 at 6:28 a.m., 09/24/2014 and 09/19/2014  FINDINGS: Right PleurX catheter has been inserted and the right pleural effusion has diminished. Tiny right apical pneumothorax.  Increased left pleural effusion. Heart size and vascularity are normal. Large spur right substernal goiter is unchanged.  IMPRESSION: PleurX catheter appears in good position. Significant decrease in right pleural effusion. Tiny right apical pneumothorax.  Increased small left effusion.   Electronically Signed   By: Lorriane Shire M.D.   On: 09/27/2014 12:40   Dg Chest Port 1 View  09/24/2014   CLINICAL DATA:  Shortness of breath.  Pleural effusion.  EXAM: PORTABLE CHEST - 1 VIEW  COMPARISON:  Multiple prior studies including 09/22/2014  FINDINGS: Right IJ central line has been removed. Heart is enlarged. Bilateral pleural  effusions again noted. There is right basilar opacity, stable in appearance and consistent with atelectasis or infiltrate. Left lower lung opacity obscures the hemidiaphragm and is consistent with atelectasis or infiltrate. Right mediastinal mass again noted and stable.  IMPRESSION: Persistent bilateral lower lobe opacities and pleural effusions.  Removal of right IJ central line.   Electronically Signed   By: Nolon Nations M.D.   On: 09/24/2014 08:48   Dg Chest Port 1 View  09/22/2014   CLINICAL DATA:  Right pleural effusion and shortness of breath. Status post right thoracentesis on 09/19/2014. History of thyroid goiter and breast carcinoma.  EXAM: PORTABLE CHEST - 1 VIEW  COMPARISON:  09/21/2014 and CT of the chest on 09/19/2014.  FINDINGS: Since thoracentesis, there is slight reaccumulation of right pleural fluid with a relatively small effusion identified by chest x-ray. There likely is a small left pleural effusion. Right lower lung atelectasis versus infiltrate present. Stable appearance of jugular central line and large right-sided mediastinal thyroid goiter.  IMPRESSION: Slight reaccumulation of right pleural fluid with relatively small effusion present. There likely is a small left pleural effusion. Right lower lung atelectasis versus infiltrate.   Electronically Signed   By: Aletta Edouard M.D.   On: 09/22/2014 10:41   Dg Chest  Port 1 View  09/21/2014   CLINICAL DATA:  Dyspnea. Pleural effusions. Large substernal goiter.  EXAM: PORTABLE CHEST - 1 VIEW  COMPARISON:  09/20/2014  FINDINGS: Central venous catheter is unchanged with the tip in the superior vena cava above the cavoatrial junction. Again noted is the large right-sided substernal goiter extending into the right hemi thorax. There is new  There is new slight interstitial pulmonary edema. Bilateral pleural effusions have slightly increased. Cardiomegaly is unchanged. Pulmonary vascularity appears slightly less prominent.  IMPRESSION: New  interstitial edema.  Increasing effusions.   Electronically Signed   By: Lorriane Shire M.D.   On: 09/21/2014 07:03   Dg Chest Port 1 View  09/21/2014   CLINICAL DATA:  Central line placement  EXAM: PORTABLE CHEST - 1 VIEW  COMPARISON:  09/20/2014  FINDINGS: Interval placement of a right central venous catheter. Tip is projected over the low SVC region. The course of the catheter is somewhat lateral than would be expected but this is likely due to vascular displacement by a large right peritracheal mass lesion. Small amount of subcutaneous emphysema in the right neck. Persistent cardiac enlargement with mild pulmonary vascular congestion. Bilateral pleural effusions with basilar atelectasis or infiltration bilaterally. Surgical clips in the axilla bilaterally. Calcification of the aorta. No pneumothorax.  IMPRESSION: Right central venous catheter appears to be in adequate position. Persistent large right peritracheal mass lesion. Cardiac enlargement with pulmonary vascular congestion and bilateral pleural effusions with basilar infiltration or atelectasis.   Electronically Signed   By: Lucienne Capers M.D.   On: 09/21/2014 00:30   Dg Chest Port 1 View  09/20/2014   CLINICAL DATA:  81 year old female status post right thoracentesis  EXAM: PORTABLE CHEST - 1 VIEW  COMPARISON:  Prior chest x-ray 09/19/2014  FINDINGS: Stable cardiomegaly. Atherosclerotic calcification again noted in the transverse aorta. Similar appearance of large right paratracheal mass. There may be slight interval reaccumulation of the right-sided pleural effusion. New patchy opacity in the right lung base. Inspiratory volumes are slightly lower. The left lung remains relatively clear. Surgical changes suggest prior bilateral mastectomies. No acute osseous abnormality.  IMPRESSION: 1. Interval development of patchy airspace opacity in the right lung base which may represent atelectasis, infiltrate or aspiration in the appropriate clinical  setting. 2. Perhaps trace interval reaccumulation of right pleural fluid. 3. Stable right paratracheal mass.   Electronically Signed   By: Jacqulynn Cadet M.D.   On: 09/20/2014 14:45   Dg Swallowing Func-speech Pathology  09/28/2014    Objective Swallowing Evaluation:    Patient Details  Name: [name] MRN: [id number] Date of Birth: 1932/11/22  Today's Date: 09/28/2014 Time: SLP Start Time (ACUTE ONLY): 0915-SLP Stop Time (ACUTE ONLY): 5916 SLP Time Calculation (min) (ACUTE ONLY): 12 min  Past Medical History:  Past Medical History  Diagnosis Date  . Hypercholesterolemia     takes Crestor daily  . Thyroid disease     had iodine radiation  . Hypertension     takes Hyzaar daily  . Dysrhythmia     takes Carvedilol daily  . Pneumonia     history of;last time about 4-73yr ago  . GERD (gastroesophageal reflux disease)     takes Protonix daily  . Gastric ulcer   . History of blood transfusion   . History of colon polyps   . Anemia     takes iron pill daily  . Diabetes mellitus without complication     takes Tradjenta daily  . History of  radiation therapy 07/12/12-08/26/12    right breast/  . Paroxysmal atrial fibrillation     PCP EKG 09/27/2013: A. Fibrillation.. EKG 09/29/2013: S. Tach.  . Breast cancer 04/12/12    right-pos lymph node/left-DCIS  . Shortness of breath on exertion   . Bruit of left carotid artery   . Acute upper GI bleeding 01/24/2012  . MGUS (monoclonal gammopathy of unknown significance) 04/21/2013  . Osteoporosis 11/29/2013  . Recurrent right pleural effusion 07/26/2014  . Stage III chronic kidney disease 07/26/2014  . Goiter 07/27/2014  . Substernal goiter 12/23/2012  . Pleural effusion, right 12/23/2012  . Type 2 diabetes mellitus with renal manifestations 07/26/2014  . Dysphonia 09/20/2014  . Vocal cord paralysis     Suspected due to massive substernal goiter   Past Surgical History:  Past Surgical History  Procedure Laterality Date  . Carpal tunnel release      left  . Breast biopsy    .  Esophagogastroduodenoscopy  01/24/2012    Procedure: ESOPHAGOGASTRODUODENOSCOPY (EGD);  Surgeon: Jeryl Columbia, MD;   Location: [facility];  Service: Endoscopy;  Laterality: N/A;  . Thyroid goiter removed    . Cyst removed from back of neck    . Knee surgery      right with rods  . Esophagogastroduodenoscopy    . Colonoscopy    . Total mastectomy Bilateral 05/10/2012    Procedure: RIGHT modified mastectomy; LEFT total mastectomy;  Surgeon:  [name], MD;  Location: [facility];  Service: General;  Laterality:  Bilateral;  . Axillary sentinel node biopsy Right 05/10/2012    Procedure: AXILLARY SENTINEL lymph  NODE BIOPSY;  Surgeon: [name], MD;  Location: [facility];  Service: General;  Laterality: Right;   nuclear medicine injection right side  7:00 am   . Abdominal hysterectomy      fibroids, with bilateral SO   HPI:  Other Pertinent Information: 81 yo female with hx of breast cancer, right  nodule goiter, HLD, afib on chronic anticoagulation, HTN, DM2 on tradjenta  and hx of recurrent R sided pleural effusions who presented to the ED with  subjectively worsening sob over the past several days per MD note. Pt is  s/p thoracentesis 6/16 yielding 800cc of fluid and thoracentesis during  this admission.  Chest imaging demonstrated moderate R sided effusion. Pt  remained without hypoxia or tachypnea. Swallow evaluation ordered.  Pt  reports dysphagia with food/drink over the last few months.  Has some  chronic dysphagia due to goiter and voice changes after goiter surgery  approximately 40 years ago.  Pt admits to weight loss but denies dysphagia  being source. Pt reports frequently choking on bulky items as well as with  liquids.   Pt has never had swallow evaluation completed.  CXR showed  development of right lobe infiltrate, ? aspiraton.    No Data Recorded  Assessment / Plan / Recommendation CHL IP CLINICAL IMPRESSIONS 09/28/2014  Therapy Diagnosis (None)  Clinical Impression Patient with mild oral and  moderate pharyngeal motor  based dysphagia, characterized by decreased base of tongue strength,  delayed swallow initiation, hyloaryngeal excursion and boney prominences  from her cervical spine that appear to decrease pharyngeal space.  These  impairments result in penetration and aspiration across consistencies as  well as moderate pharyngeal residue.  Chin tuck posture with small boluses  and use of hard swallows effectively protected airway and prevented  pharyngeal residue.  Recommend initiation of a Dys.2 texture diet with  thin liquids and strict use of aspiration precautions.  SLP will to  continue to follow.        CHL IP TREATMENT RECOMMENDATION 09/28/2014  Treatment Recommendations Therapy as outlined in treatment plan below     CHL IP DIET RECOMMENDATION 09/28/2014  SLP Diet Recommendations Dysphagia 2 (Fine chop);Thin  Liquid Administration via (None)  Medication Administration Whole meds with puree  Compensations Slow rate;Small sips/bites;Follow solids with liquid;Chin  tuck;Effortful swallow  Postural Changes and/or Swallow Maneuvers (None)     CHL IP OTHER RECOMMENDATIONS 09/28/2014  Recommended Consults (None)  Oral Care Recommendations Oral care BID  Other Recommendations (None)     CHL IP FOLLOW UP RECOMMENDATIONS 09/26/2014  Follow up Recommendations 24 hour supervision/assistance;Home health SLP     CHL IP FREQUENCY AND DURATION 09/28/2014  Speech Therapy Frequency (ACUTE ONLY) min 2x/week  Treatment Duration 1 week     Pertinent Vitals/Pain None    SLP Swallow Goals No flowsheet data found.  No flowsheet data found.    CHL IP REASON FOR REFERRAL 09/28/2014  Reason for Referral Objectively evaluate swallowing function     CHL IP ORAL PHASE 09/28/2014  Lips (None)  Tongue (None)  Mucous membranes (None)  Nutritional status (None)  Other (None)  Oxygen therapy (None)  Oral Phase WFL  Oral - Pudding Teaspoon (None)  Oral - Pudding Cup (None)  Oral - Honey Teaspoon (None)  Oral - Honey Cup (None)  Oral -  Honey Syringe (None)  Oral - Nectar Teaspoon (None)  Oral - Nectar Cup (None)  Oral - Nectar Straw (None)  Oral - Nectar Syringe (None)  Oral - Ice Chips (None)  Oral - Thin Teaspoon (None)  Oral - Thin Cup (None)  Oral - Thin Straw (None)  Oral - Thin Syringe (None)  Oral - Puree (None)  Oral - Mechanical Soft (None)  Oral - Regular (None)  Oral - Multi-consistency (None)  Oral - Pill (None)  Oral Phase - Comment (None)      CHL IP PHARYNGEAL PHASE 09/28/2014  Pharyngeal Phase Impaired  Pharyngeal - Pudding Teaspoon (None)  Penetration/Aspiration details (pudding teaspoon) (None)  Pharyngeal - Pudding Cup (None)  Penetration/Aspiration details (pudding cup) (None)  Pharyngeal - Honey Teaspoon (None)  Penetration/Aspiration details (honey teaspoon) (None)  Pharyngeal - Honey Cup (None)  Penetration/Aspiration details (honey cup) (None)  Pharyngeal - Honey Syringe (None)  Penetration/Aspiration details (honey syringe) (None)  Pharyngeal - Nectar Teaspoon (None)  Penetration/Aspiration details (nectar teaspoon) (None)  Pharyngeal - Nectar Cup (None)  Penetration/Aspiration details (nectar cup) (None)  Pharyngeal - Nectar Straw (None)  Penetration/Aspiration details (nectar straw) (None)  Pharyngeal - Nectar Syringe (None)  Penetration/Aspiration details (nectar syringe) (None)  Pharyngeal - Ice Chips (None)  Penetration/Aspiration details (ice chips) (None)  Pharyngeal - Thin Teaspoon (None)  Penetration/Aspiration details (thin teaspoon) (None)  Pharyngeal - Thin Cup (None)  Penetration/Aspiration details (thin cup) (None)  Pharyngeal - Thin Straw (None)  Penetration/Aspiration details (thin straw) (None)  Pharyngeal - Thin Syringe (None)  Penetration/Aspiration details (thin syringe') (None)  Pharyngeal - Puree (None)  Penetration/Aspiration details (puree) (None)  Pharyngeal - Mechanical Soft (None)  Penetration/Aspiration details (mechanical soft) (None)  Pharyngeal - Regular (None)  Penetration/Aspiration details  (regular) (None)  Pharyngeal - Multi-consistency (None)  Penetration/Aspiration details (multi-consistency) (None)  Pharyngeal - Pill (None)  Penetration/Aspiration details (pill) (None)  Pharyngeal Comment (None)      CHL IP CERVICAL ESOPHAGEAL PHASE 09/28/2014  Cervical Esophageal Phase WFL  Pudding Teaspoon (None)  Pudding Cup (None)  Honey Teaspoon (None)  Honey Cup (None)  Honey Straw (None)  Nectar Teaspoon (None)  Nectar Cup (None)  Nectar Straw (None)  Nectar Sippy Cup (None)  Thin Teaspoon (None)  Thin Cup (None)  Thin Straw (None)  Thin Sippy Cup (None)  Cervical Esophageal Comment (None)    CHL IP GO 09/20/2014  Functional Assessment Tool Used clinical judgement  Functional Limitations Swallowing  Swallow Current Status (L8453) CM  Swallow Goal Status (M4680) CL  Swallow Discharge Status (H2122) (None)  Motor Speech Current Status (Q8250) (None)  Motor Speech Goal Status (I3704) (None)  Motor Speech Goal Status (U8891) (None)  Spoken Language Comprehension Current Status (Q9450) (None)  Spoken Language Comprehension Goal Status (T8882) (None)  Spoken Language Comprehension Discharge Status [phone]) (None)  Spoken Language Expression Current Status (J1791) (None)  Spoken Language Expression Goal Status (T0569) (None)  Spoken Language Expression Discharge Status [phone]) (None)  Attention Current Status (X6553) (None)  Attention Goal Status (Z4827) (None)  Attention Discharge Status (M7867) (None)  Memory Current Status (J4492) (None)  Memory Goal Status (E1007) (None)  Memory Discharge Status (H2197) (None)  Voice Current Status (J8832) (None)  Voice Goal Status (P4982) (None)  Voice Discharge Status (M4158) (None)  Other Speech-Language Pathology Functional Limitation [phone]) (None)  Other Speech-Language Pathology Functional Limitation Goal Status (H6808)  (None)  Other Speech-Language Pathology Functional Limitation Discharge Status  [phone]) (None)    Gunnar Fusi, M.A., CCC-SLP [phone]         [place] 09/28/2014, 11:11 AM    Dg C-arm 1-60 Min-no Report  09/27/2014   CLINICAL DATA: surgery   C-ARM 1-60 MINUTES  Fluoroscopy was utilized by the requesting physician.  No radiographic  interpretation.    US Thoracentesis Asp Pleural Space W/img Guide  09/19/2014   INDICATION: Breast cancer, dyspnea, recurrent right pleural effusion. Request is made for diagnostic and therapeutic right thoracentesis.  EXAM: ULTRASOUND GUIDED DIAGNOSTIC AND THERAPEUTIC RIGHT THORACENTESIS  COMPARISON:  Prior thoracentesis on 08/24/2014  MEDICATIONS: None  COMPLICATIONS: None immediate  TECHNIQUE: Informed written consent was obtained from the patient after a discussion of the risks, benefits and alternatives to treatment. A timeout was performed prior to the initiation of the procedure.  Initial ultrasound scanning demonstrates a moderate to large right pleural effusion. The lower chest was prepped and draped in the usual sterile fashion. 1% lidocaine was used for local anesthesia.  An ultrasound image was saved for documentation purposes. A 6 Fr Safe-T-Centesis catheter was introduced. The thoracentesis was performed. The catheter was removed and a dressing was applied. The patient tolerated the procedure well without immediate post procedural complication. The patient was escorted to have an upright chest radiograph.  FINDINGS: A total of approximately 1.2 liters of hazy, amber fluid was removed. Requested samples were sent to the laboratory.  IMPRESSION: Successful ultrasound-guided diagnostic and therapeutic right sided thoracentesis yielding 1.2 liters of pleural fluid.  Read by: Rowe Robert, PA-C   Electronically Signed   By: Markus Daft M.D.   On: 09/19/2014 16:31         Subjective: Patient denies fevers, chills, headache, chest pain, dyspnea, nausea, vomiting, diarrhea, abdominal pain, dysuria, hematuria   Objective: Filed Vitals:   09/28/14 2005 09/28/14 2305 09/29/14 0506 09/29/14 1500  BP:  93/53 92/62 109/53 111/64  Pulse: 106 125 94 87  Temp: 99.2 F (37.3 C)  98.6 F (37 C) 98.9 F (37.2 C)  TempSrc: Oral  Oral Oral  Resp: 18  18  18  Height:      Weight:   58.922 kg (129 lb 14.4 oz)   SpO2: 97%  100% 100%    Intake/Output Summary (Last 24 hours) at 09/29/14 1658 Last data filed at 09/28/14 2100  Gross per 24 hour  Intake    240 ml  Output   1000 ml  Net   -760 ml   Weight change: 0.363 kg (12.8 oz) Exam:   General:  Pt is alert, follows commands appropriately, not in acute distress  HEENT: No icterus, No thrush, No neck mass, [place]/AT  Cardiovascular: RRR, S1/S2, no rubs, no gallops  Respiratory: Diminished breath sounds right base. Bibasilar crackles. No wheeze  Abdomen: Soft/+BS, non tender, non distended, no guarding  Extremities: No edema, No lymphangitis, No petechiae, No rashes, no synovitis  Data Reviewed: Basic Metabolic Panel:  Recent Labs Lab 09/25/14 0400 09/26/14 0337 09/26/14 2219 09/27/14 0349 09/28/14 0238 09/29/14 0340  NA 142 139 139 140 141 141  K 3.7 3.9 3.7 3.8 4.0 3.6  CL 115* 112* 108 108 109 107  CO2 20* 21* '24 23 24 25  '$ GLUCOSE 129* 113* 120* 112* 130* 108*  BUN 29* 23* 22* 22* 22* 21*  CREATININE 1.52* 1.50* 1.50* 1.49* 1.50* 1.59*  CALCIUM 8.8* 9.1 9.0 8.9 9.1 9.1  MG 1.7  --   --   --   --   --    Liver Function Tests:  Recent Labs Lab 09/26/14 2219 09/29/14 0340  AST 21 16  ALT 16 12*  ALKPHOS 74 54  BILITOT 0.7 0.5  PROT 6.0* 5.7*  ALBUMIN 3.0* 2.9*   No results for input(s): LIPASE, AMYLASE in the last 168 hours. No results for input(s): AMMONIA in the last 168 hours. CBC:  Recent Labs Lab 09/26/14 0337 09/26/14 2219 09/27/14 0349 09/28/14 1021 09/29/14 0340  WBC 5.9 6.0 5.3 5.3 4.5  HGB 8.8* 9.4* 8.6* 9.5* 8.3*  HCT 27.6* 28.9* 26.7* 29.1* 25.7*  MCV 86.5 86.8 87.0 87.7 86.5  PLT 256 285 275 290 214   Cardiac Enzymes: No results for input(s): CKTOTAL, CKMB, CKMBINDEX, TROPONINI in the  last 168 hours. BNP: Invalid input(s): POCBNP CBG:  Recent Labs Lab 09/28/14 1726 09/28/14 2116 09/29/14 0617 09/29/14 1204 09/29/14 1626  GLUCAP 124* 122* 92 95 89    Recent Results (from the past 240 hour(s))  Culture, blood (routine x 2)     Status: None   Collection Time: 09/20/14  5:45 PM  Result Value Ref Range Status   Specimen Description BLOOD LEFT ARM  Final   Special Requests BOTTLES DRAWN AEROBIC AND ANAEROBIC 5CC  Final   Culture   Final    NO GROWTH 5 DAYS Performed at [facility]    Report Status 09/25/2014 FINAL  Final  Culture, blood (routine x 2)     Status: None   Collection Time: 09/20/14  5:54 PM  Result Value Ref Range Status   Specimen Description BLOOD LEFT HAND  Final   Special Requests BOTTLES DRAWN AEROBIC ONLY 1.5CC  Final   Culture   Final    NO GROWTH 5 DAYS Performed at [facility]    Report Status 09/25/2014 FINAL  Final  MRSA PCR Screening     Status: None   Collection Time: 09/20/14  6:45 PM  Result Value Ref Range Status   MRSA by PCR NEGATIVE NEGATIVE Final    Comment:        The GeneXpert MRSA Assay (  FDA approved for NASAL specimens only), is one component of a comprehensive MRSA colonization surveillance program. It is not intended to diagnose MRSA infection nor to guide or monitor treatment for MRSA infections.   Body fluid culture     Status: None (Preliminary result)   Collection Time: 09/27/14 11:44 AM  Result Value Ref Range Status   Specimen Description FLUID RIGHT PLEURAL  Final   Special Requests PART B  Final   Gram Stain   Final    WBC PRESENT,BOTH PMN AND MONONUCLEAR NO ORGANISMS SEEN CYTOSPIN SMEAR    Culture NO GROWTH 2 DAYS  Final   Report Status PENDING  Incomplete     Scheduled Meds: . amiodarone  200 mg Oral BID  . anastrozole  1 mg Oral Q breakfast  . atorvastatin  10 mg Oral q1800  . feeding supplement  1 Container Oral TID BM  . feeding supplement (ENSURE ENLIVE)  237 mL  Oral BID BM  . ferrous sulfate  325 mg Oral TID WC  . insulin aspart  0-15 Units Subcutaneous TID WC  . insulin aspart  0-5 Units Subcutaneous QHS  . linagliptin  5 mg Oral Daily  . metoprolol tartrate  12.5 mg Oral QID  . pantoprazole  40 mg Oral Daily  . polyethylene glycol  17 g Oral Daily  . warfarin  5 mg Oral ONCE-1800  . Warfarin - Pharmacist Dosing Inpatient   Does not apply q1800   Continuous Infusions: . heparin 1,050 Units/hr (09/29/14 0411)     Viral Schramm, DO  [facility] Pager [phone]  If 7PM-7AM, please contact night-coverage [email or web address] Password [facility] 09/29/2014, 4:58 PM   LOS: 8 days

## 2014-09-30 LAB — HEPARIN LEVEL (UNFRACTIONATED): Heparin Unfractionated: 0.27 IU/mL — ABNORMAL LOW (ref 0.30–0.70)

## 2014-09-30 LAB — CBC
HEMATOCRIT: 25.3 % — AB (ref 36.0–46.0)
HEMOGLOBIN: 8.3 g/dL — AB (ref 12.0–15.0)
MCH: 28.9 pg (ref 26.0–34.0)
MCHC: 32.8 g/dL (ref 30.0–36.0)
MCV: 88.2 fL (ref 78.0–100.0)
Platelets: 277 10*3/uL (ref 150–400)
RBC: 2.87 MIL/uL — ABNORMAL LOW (ref 3.87–5.11)
RDW: 16.8 % — ABNORMAL HIGH (ref 11.5–15.5)
WBC: 5.1 10*3/uL (ref 4.0–10.5)

## 2014-09-30 LAB — GLUCOSE, CAPILLARY
GLUCOSE-CAPILLARY: 83 mg/dL (ref 65–99)
GLUCOSE-CAPILLARY: 108 mg/dL — AB (ref 65–99)
Glucose-Capillary: 86 mg/dL (ref 65–99)
Glucose-Capillary: 86 mg/dL (ref 65–99)

## 2014-09-30 LAB — BODY FLUID CULTURE: Culture: NO GROWTH

## 2014-09-30 LAB — PROTIME-INR
INR: 1.26 (ref 0.00–1.49)
PROTHROMBIN TIME: 15.9 s — AB (ref 11.6–15.2)

## 2014-09-30 MED ORDER — COUMADIN BOOK
Freq: Once | Status: AC
  Administered 2014-09-30: 1
  Filled 2014-09-30: qty 1

## 2014-09-30 MED ORDER — WARFARIN VIDEO: Freq: Once | Status: DC

## 2014-09-30 MED ORDER — WARFARIN SODIUM 5 MG PO TABS
5.0000 mg | ORAL_TABLET | Freq: Once | ORAL | Status: AC
  Administered 2014-09-30: 5 mg via ORAL
  Filled 2014-09-30: qty 1

## 2014-09-30 NOTE — Progress Notes (Signed)
PROGRESS NOTE  Alison Gross ASN:[id number] DOB: 02/15/1933 DOA: 09/19/2014 PCP: Maximino Greenland, MD  Brief Summary  Alison Gross is a 81 y.o. female with a hx of goiter, HLD, afib on chronic anticoagulation, chronic CHF, HTN, DM2 on tradjenta, breast cancer in remission, MGUS, and hx of recurrent R sided pleural effusions who presents to the [place] ED on 7/12 with worsening sob over the past several days. Pt is s/p thoracentesis 6/16 yielding 800cc. In ED, chest imaging demonstrated moderate R sided effusion. Of note, patient does have a known goiter. Family reported pt had been having more difficulty swallowing foods recently. Essentially, she has had a chronic goiter, part of which was removed in the 1970s (external portion). It is large, intrathoracic and was probably the cause of her chronic right effusion which she tolerated for many years. She has had some marginal enlargement in the last few months which has led to dyspnea requiring thoracentesis, dysphonia, and dysphagia. Ultimately, she needs treatment for her goiter or she will need palliative measures such as pleurex. Options being explored are surgical removal, radiation. As per TCTS, high risk for major surgery and hence plans for right Pleurx catheter placement on 7/20.  STUDIES:  6/16 Rt pleural fluid >> 800 ml, protein 3.2, LDH 49, WBC 614 (84%L), atypical cells present on cytology but were finally reported as reactive mesothelial cells 7/11 PET scan >> moderate/large Rt pleural effusion, massive thyroid goiter 7/12 ADMIT 7/12 CT chest >> third spacing of fluid, Large Rt pleural effusion, very large thyroid, centrilobular emphysema 7/12 Rt thoracentesis with removal of 1200 mL 7/13 developed hypotension and transferred to ICU requiring IVF and vasopressors, Speech assessment >> stage 1 baby food 7/14 weaned off vasopressors 7/14 CT head/neck >> massive Rt thyroid, mod/large Rt effusion 7/14 Echo >>  severe LVH, EF 55 to 19%, grade 2 diastolic dysfx, severe LA dilation, mod RA dilation, small/mod pericardial effusion 7/15: Right IJ TLC removed 7/16: DVT in the RUE, transfer to [facility] for cardiothoracic surgery consultation in hospital, tapering hydrocortisone 7/20: R-Pleurx placed 7/22: start warfarin  Assessment/Plan  Goiter, large and compressing the right lung and likely contributing to her effusion, swallowing and speech difficulties. See above. TSH 1.115 on 6/15 and fT3 and fT4 wnl - Status post remote partial thyroidectomy and RAI in 2015. Reviewed old records and patient had seen Dr. Chalmers Cater, endocrinology in March 2013 - Patient was transferred from [facility] to [facility] on 7/16 for thoracic surgery opinion regarding goiter surgery. - As per Dr. Virgie Dad discussion with radiation oncology-not appropriate for radiation treatment - 2 TCTS surgeons have evaluated patient and determined that she is high risk candidate for major surgery i.e. thyroidectomy.  -right Pleurx catheter placement 7/20 for recurrent symptomatic pleural effusion.  Recurrent right pleural effusion  - etiology unclear -likely secondary to her goiter vs CHF - transudate by [employer] Criteria but had WBC 501 - Status post thoracentesis by interventional radiology on 7/12 - Cytology from 6/16 and 7/12 both with reactive mesothelial cells - Pleural fluid Cx NG - Pleurx 7/20. -Dr. Algis Liming discussed with Dr. [name] on 7/19 and he recommended repeating Echo to reassess Pericardial fluid -1400cc drainage from Pleurx since placement on 09/27/14  Acute on Chronic diastolic heart failure - Significantly volume overloaded. Started IV Lasix 7/17-dosed daily based on BMP - daily weight - lasix (started 7/17)--has received 4 doses -Neg 11 pounds since admission -further dosing per cardiology-->2 po  doses lasix 7/21-7/22 -7/23--d/c foley  Paroxysmal atrial fibrillation,  - Patient had A. fib with RVR  overnight 7/16 and was started on Cardizem drip and titrate up to 10 mg/h. She reverted to sinus rhythm. Remains in sinus rhythm. Cardiology has changed to oral Cardizem 7/16. -d/c diltiazem as pt has pt only received one dose in past 48 hours due to soft BPs -decrease metoprolol dosing due to soft BP -Due to the patient's EKG stage IV, unable to use factor Xa inhibitors. We'll need to continue heparin bridge with initiation of warfarin. 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 The case was discussed by Dr. Sheran Fava with Dr. Wilburn Cornelia from ENT who will follow up as outpatient for evaluation - Speech therapy  - follow-up with ENT in clinic   Dysphagia, likely secondary to large goiter. Got choked up on dysphagia 2 diet. - 7/21 MBS-->recommended dys 2 diet with thin liquid per speech  Hypotension, was likely secondary to intravascular volume depletion/dehydration and relative adrenal insufficiency. She was given IVF and started on stress dose steroids. - Off stress dose steroids since 7/17 - resolved. Now intermittent soft blood pressures may be related to Cardizem and furosemide.  Acute DVT in RUE secondary to central line - Central line removed on 7/15 - 7/22-start warfarin with heparin bridge  Elevated troponin, chest pain-free,  -EKG demonstrates no acute ST segment elevation or T-wave inversions. She did previously have some T-wave inversions in the lateral leads and some mild ST segment elevations in the anterior leads which are stable.  -Stable elevation due to CKD and effusion. - Cardiology evaluated and performed a stress test on 7/18: Negative  Diabetes mellitus type 2 with well controlled blood sugars,  - A1c 6.2 On 07/27/14 -CBGs well controlled-->d/c SSI and CBG checks - continue Tradjenta  Right-sided breast cancer in remission,  -no evidence of  recurrence on recent PET scan--09/18/14 - Continue arimidex - Dr. Virgie Dad courtesy f/u appreciated.  Hyperlipidemia, stable, continue atorvastatin  CKD stage III-IV,  -Creatinine clearance less than 30  - Minimize nephrotoxicity and renally dose medications - Creatinine gradually improving over the last 5 days. - baseline creatinine 1.3-1.6  Hypercalcemia secondary to dehydration and possible thyroid abnormalities (nonparathyroid hypercalcemia), resolved.  - Vitamin D 54, PTHrp wnl, PTH < 10  Iron deficiency anemia superimposed on MGUS. - continue iron supplementation - B12, folate, and TSH wnl - Hemoglobin stable.  Moderate malnutrition - Nutrition  - supplements  Constipation  - Continue miralax - Bisacodyl suppository - Lactulose 30gm once  NSVT 1 on 7/17 - Continue monitoring on telemetry. Try to keep K >4 & magnesium >2.    Diet: Dysphagia 2 Access: PIV IVF: off Proph: Heparin gtt   Code Status: full  Family Communication: Discussed with patient's spouse at bedside on 7/17. Disposition Plan: To be determined.  Procedures/Studies: Dg Chest 1 View  09/19/2014   CLINICAL DATA:  Status post right thoracentesis  EXAM: CHEST  1 VIEW  COMPARISON:  09/19/2014  FINDINGS: Negative for pneumothorax following right thoracentesis. Trace right effusion remains. Improved right middle and lower lobe aeration with some residual atelectasis. Large mediastinal mass along the right peritracheal region compatible with a known massive thyroid goiter. Left lung remains clear. Stable cardiomegaly and vascularity. No superimposed edema.  IMPRESSION: Negative for pneumothorax following right thoracentesis. Decrease in the right effusion. Otherwise stable exam.   Electronically Signed  By: Eugenie Filler M.D.   On: 09/19/2014 16:44   Dg Chest 2 View  09/27/2014   CLINICAL DATA:  Shortness of breath and cough  EXAM: CHEST  2 VIEW  COMPARISON:  Chest radiograph September 26, 2014; chest CT September 19, 2014  FINDINGS: Pleural effusions bilaterally are stable. There is patchy consolidation in the bases, also stable. No new parenchymal lung opacity identified. The large right-sided thyroid goiter remains with deviation of the lower thoracic trachea toward the left. The heart size is within normal limits. Pulmonary vascularity is within normal limits. There are surgical clips in both axillary regions as well as in the right peritracheal region, stable. There is stable degenerative change in the thoracic spine.  IMPRESSION: No no appreciable change from 1 day prior. Bilateral effusions with bibasilar patchy airspace consolidation remain. This consolidation is felt to be largely due to atelectasis, although superimposed pneumonia must be of concern. No change in cardiac silhouette. Large right lobe thyroid goiter remains, stable. Tracheal deviation is likewise unchanged.   Electronically Signed   By: [name] III M.D.   On: 09/27/2014 07:35   Dg Chest 2 View  09/26/2014   CLINICAL DATA:  81 year old female with pleural effusion  EXAM: CHEST  2 VIEW  COMPARISON:  Chest radiograph dated 09/24/2014 and CT dated 09/19/2014  FINDINGS: Two views of the chest demonstrate emphysematous changes. There are moderate right and small left pleural effusion. There is associated partial compressive atelectasis of the right lower lobe. Superimposed pneumonia is not excluded. There is no pneumothorax. Large right thyroid mass again noted with mass effect and mild deviation of trachea to the left. There is degenerative changes of the spine.  IMPRESSION: No significant interval change in size of the bilateral pleural effusions.  Large right thyroid nodule.   Electronically Signed   By: Anner Crete M.D.   On: 09/26/2014 21:54   Dg Chest 2 View  09/19/2014   CLINICAL DATA:  Increasing shortness of breath. Wheezing. Onset of symptoms last night.  EXAM: CHEST  2 VIEW  COMPARISON:  CT 09/18/2014.   FINDINGS: RIGHT paratracheal mass compatible with substernal goiter. Moderate RIGHT pleural effusion with compressive atelectasis. These findings appear similar to the CT yesterday. Aortic arch atherosclerosis. Axillary surgical clips bilaterally. Monitoring leads project over the chest. Cardiopericardial silhouette appears unchanged compared to prior chest radiographs. Lung volumes are lower than on previous study 08/24/2014.  IMPRESSION: 1. Moderate RIGHT pleural effusion with compressive/relaxation atelectasis of the RIGHT lower lobe and RIGHT middle lobe. 2. Large RIGHT peritracheal mass compatible with substernal goiter evaluated on prior CT. 3. Lower lung volumes than on prior chest radiographs.   Electronically Signed   By: Dereck Ligas M.D.   On: 09/19/2014 08:11   Ct Soft Tissue Neck Wo Contrast  09/21/2014   CLINICAL DATA:  81 year old female with difficulty swallowing. Initial encounter. Right pleural effusion treated with thoracentesis 2 days ago. History of thyroid goiter. History of treated breast cancer in 2014.  EXAM: CT NECK WITHOUT CONTRAST  TECHNIQUE: Multidetector CT imaging of the neck was performed following the standard protocol without intravenous contrast.  COMPARISON:  PET-CT 09/18/2014, 10/28/2013.  Chest CT 10/28/2013.  FINDINGS: Chronic massive right thyroid lobe enlargement, with the vast majority of the enlarged right lobe occupying the mediastinum and right hemithorax.  Mediastinal involvement by right thyroid goiter seen as far back as 2004 (with about 7 cm of right thyroid lobe in the mediastinum at that time).  Currently  the right lobe extends as far caudally as the right mainstem bronchus and encompasses approximately 8.0 by 9.8 x 15.0 cm (AP by transverse by CC), with an estimated volume of 588 mL. Compare that to a normal splenic volume of no larger than 412 mL.  Chronic heterogeneous density throughout the enlarged lobe. The left thyroid lobe is also chronically but more  modestly enlarged encompassing about 4.3 x 3.1 x 6.4 cm (AP by transverse by CC). Despite the marked thyroid enlargement there is no significant mass effect on the trachea, carina, or right mainstem.  Asymmetry of the larynx suggests right vocal cord paralysis. No laryngeal or pharyngeal soft tissue mass is evident. Negative parapharyngeal, retropharyngeal, and sublingual spaces. Negative non contrast submandibular and parotid glands. No cervical lymphadenopathy identified in the absence of contrast. Negative visualized noncontrast brain parenchyma. Postoperative changes to the globes.  Chronic sphenoid sinus periosteal thickening, but Visualized paranasal sinuses and mastoids are clear. Absent dentition. No acute or suspicious osseous lesion identified. Small sclerotic focus in the right manubrium is stable since 2004.  Right IJ approach central line in place.  Layering moderate to large right pleural effusion and smaller left pleural effusion. Compressive atelectasis in both lungs greater on the right.  IMPRESSION: 1. Massive chronic enlargement of the right thyroid lobe, with the vast majority of the enlarged lobe occupying the mediastinum and right hemithorax as far down as the right mainstem bronchus. See details above. 2. Despite this, no significant mass effect on the airway or trachea. 3. Layering moderate to large right and smaller left pleural effusions with right greater than left pulmonary compressive atelectasis. 4. Suggestion of right vocal cord paralysis. No laryngeal/pharyngeal mass or cervical lymphadenopathy identified in the absence of contrast.   Electronically Signed   By: Genevie Ann M.D.   On: 09/21/2014 10:06   Ct Chest Wo Contrast  09/19/2014   CLINICAL DATA:  Right breast cancer. Increasing shortness of breath.  EXAM: CT CHEST WITHOUT CONTRAST  TECHNIQUE: Multidetector CT imaging of the chest was performed following the standard protocol without IV contrast.  COMPARISON:  Multiple exams,  including 09/18/2014 and 09/19/2014  FINDINGS: Mediastinum/Nodes: Prominent thyroid goiter with massively enlarged right lobe extending into the right hemithorax and measuring approximately 13.9 by 8.0 by 7.1 cm.  Diffuse subcutaneous edema. Aortic arch and branch vessel atherosclerosis. Low-level stranding in the mediastinum with small to moderate pericardial effusion.  Lungs/Pleura: Right pleural effusion occupies greater than 50% of right hemithoracic volume and is associated with passive atelectasis.  Centrilobular emphysema. Old granulomatous disease. Trace left pleural effusion.  Upper abdomen: Stranding in the intra-abdominal adipose tissue.  Musculoskeletal: Thoracic spondylosis.  IMPRESSION: 1. Considerable third spacing of fluid with subcutaneous, mediastinal, and upper abdominal infiltrative edema. 2. Large right and trace left pleural effusions. 3. Very large thyroid goiter, with masslike extension of the right lobe into the right hemithorax as detailed on prior exams. 4. Centrilobular emphysema.   Electronically Signed   By: Van Clines M.D.   On: 09/19/2014 09:57   Nm Myocar Multi W/spect W/wall Motion / Ef  09/25/2014    Blood pressure demonstrated a hypertensive response to exercise.  Horizontal ST segment depression ST segment depression was noted during  stress in the II, III and aVF leads, beginning at 1 minutes of stress,  ending at 6 minutes of stress, and returning to baseline after 5-9 minutes  of recovery.   CLINICAL DATA:  81 year old female with a history of chest pain.  Cardiovascular  risk factors include diabetes, hyperlipidemia.  EXAM: MYOCARDIAL IMAGING WITH SPECT (REST AND PHARMACOLOGIC-STRESS)  GATED LEFT VENTRICULAR WALL MOTION STUDY  LEFT VENTRICULAR EJECTION FRACTION  TECHNIQUE: Standard myocardial SPECT imaging was performed after resting intravenous injection of 10 mCi Tc-65msestamibi. Subsequently, intravenous infusion of Lexiscan was performed under the supervision  of the Cardiology staff. At peak effect of the drug, 30 mCi Tc-958mestamibi was injected intravenously and standard myocardial SPECT imaging was performed. Quantitative gated imaging was also performed to evaluate left ventricular wall motion, and estimate left ventricular ejection fraction.  COMPARISON:  None.  FINDINGS: Perfusion: Small fixed defect at the inferior lateral wall on rest and perfusion images. No reversible ischemia identified.  Wall Motion: Normal left ventricular wall motion. No left ventricular dilation.  Left Ventricular Ejection Fraction: 62 %  End diastolic volume 71 ml  End systolic volume 27 ml  IMPRESSION: 1. Small fixed defect at the inferior lateral wall. No reversible ischemia identified.  2. Normal left ventricular wall motion.  3. Left ventricular ejection fraction 62%  4. Low-risk stress test findings*.  *2012 Appropriate Use Criteria for Coronary Revascularization Focused Update: J Am Coll Cardiol. 206644;03(4):742-595http://content.[email or web address]?articleid=[email or web address]   Electronically Signed   By: JaCorrie Mckusick.O.   On: 09/25/2014 14:24    09/25/2014   CLINICAL DATA:  [age]ear old female with a history of chest pain.  Cardiovascular risk factors include diabetes, hyperlipidemia.  EXAM: MYOCARDIAL IMAGING WITH SPECT (REST AND PHARMACOLOGIC-STRESS)  GATED LEFT VENTRICULAR WALL MOTION STUDY  LEFT VENTRICULAR EJECTION FRACTION  TECHNIQUE: Standard myocardial SPECT imaging was performed after resting intravenous injection of 10 mCi [id number]. Subsequently, intravenous infusion of Lexiscan was performed under the supervision of the Cardiology staff. At peak effect of the drug, 30 mCi Tc-79m63mtamibi was injected intravenously and standard myocardial SPECT imaging was performed. Quantitative gated imaging was also performed to evaluate left ventricular wall motion, and estimate left ventricular ejection fraction.  COMPARISON:  None.  FINDINGS: Perfusion: Small fixed  defect at the inferior lateral wall on rest and perfusion images. No reversible ischemia identified.  Wall Motion: Normal left ventricular wall motion. No left ventricular dilation.  Left Ventricular Ejection Fraction: 62 %  End diastolic volume 71 ml  End systolic volume 27 ml  IMPRESSION: 1. Small fixed defect at the inferior lateral wall. No reversible ischemia identified.  2. Normal left ventricular wall motion.  3. Left ventricular ejection fraction 62%  4. Low-risk stress test findings*.  *2012 Appropriate Use Criteria for Coronary Revascularization Focused Update: J Am Coll Cardiol. 20126387;56(4):332-951tp://content.onliairportbarriers.comx?[email or web address]   Electronically Signed   By: JaimCorrie Mckusick.   On: 09/25/2014 14:24   Nm Pet Image Restag (ps) Skull Base To Thigh  09/18/2014   CLINICAL DATA:  Subsequent treatment strategy for breast carcinoma.  EXAM: NUCLEAR MEDICINE PET SKULL BASE TO THIGH  TECHNIQUE: 5.0 mCi F-18 FDG was injected intravenously. Full-ring PET imaging was performed from the skull base to thigh after the radiotracer. CT data was obtained and used for attenuation correction and anatomic localization.  FASTING BLOOD GLUCOSE:  Value: 94 mg/dl  COMPARISON:  By 10/28/2013 pleura PET-CT  FINDINGS: NECK  The thyroid gland is massively enlarged with a large portion extending into the right hemi thorax. There is variable metabolic activity scattered throughout this enlarged gland which is to similar prior.  CHEST  There is a moderate-sized right pleural effusion. No hypermetabolic mediastinal lymph nodes. No hypermetabolic axillary nodes. No hypermetabolic pulmonary nodules.  ABDOMEN/PELVIS  No  abnormal hypermetabolic activity within the liver, pancreas, adrenal glands, or spleen. No hypermetabolic lymph nodes in the abdomen or pelvis.  SKELETON  No focal hypermetabolic activity to suggest skeletal metastasis.  IMPRESSION: 1. No evidence of breast cancer recurrence or metastasis. 2.  Moderate to large right pleural effusion. 3. Small free fluid in the pelvis. 4. Massive thyroid goiter extending into the right hemi thorax.   Electronically Signed   By: Suzy Bouchard M.D.   On: 09/18/2014 13:32   Dg Chest Port 1 View  09/28/2014   CLINICAL DATA:  Check chest tube placement  EXAM: PORTABLE CHEST - 1 VIEW  COMPARISON:  09/27/2014  FINDINGS: Cardiac shadow is mildly enlarged but stable. A PleurX catheter is again noted on the right in stable position. Small apical right pneumothorax is not again seen. Large right superior mediastinal mass (goiter) is noted consistent with prior exams. Small pleural effusions are noted bilaterally and stable. No new focal infiltrate is seen. No acute bony abnormality is noted.  IMPRESSION: Stable appearance when compare with the prior exam. Minimal apical pneumothorax remains.   Electronically Signed   By: Inez Catalina M.D.   On: 09/28/2014 07:35   Dg Chest Port 1 View  09/27/2014   CLINICAL DATA:  PleurX catheter insertion.  Right pleural effusion.  EXAM: PORTABLE CHEST - 1 VIEW 12:20 p.m.  COMPARISON:  Chest x-rays dated 09/27/2014 at 6:28 a.m., 09/24/2014 and 09/19/2014  FINDINGS: Right PleurX catheter has been inserted and the right pleural effusion has diminished. Tiny right apical pneumothorax.  Increased left pleural effusion. Heart size and vascularity are normal. Large spur right substernal goiter is unchanged.  IMPRESSION: PleurX catheter appears in good position. Significant decrease in right pleural effusion. Tiny right apical pneumothorax.  Increased small left effusion.   Electronically Signed   By: Lorriane Shire M.D.   On: 09/27/2014 12:40   Dg Chest Port 1 View  09/24/2014   CLINICAL DATA:  Shortness of breath.  Pleural effusion.  EXAM: PORTABLE CHEST - 1 VIEW  COMPARISON:  Multiple prior studies including 09/22/2014  FINDINGS: Right IJ central line has been removed. Heart is enlarged. Bilateral pleural effusions again noted. There is  right basilar opacity, stable in appearance and consistent with atelectasis or infiltrate. Left lower lung opacity obscures the hemidiaphragm and is consistent with atelectasis or infiltrate. Right mediastinal mass again noted and stable.  IMPRESSION: Persistent bilateral lower lobe opacities and pleural effusions.  Removal of right IJ central line.   Electronically Signed   By: Nolon Nations M.D.   On: 09/24/2014 08:48   Dg Chest Port 1 View  09/22/2014   CLINICAL DATA:  Right pleural effusion and shortness of breath. Status post right thoracentesis on 09/19/2014. History of thyroid goiter and breast carcinoma.  EXAM: PORTABLE CHEST - 1 VIEW  COMPARISON:  09/21/2014 and CT of the chest on 09/19/2014.  FINDINGS: Since thoracentesis, there is slight reaccumulation of right pleural fluid with a relatively small effusion identified by chest x-ray. There likely is a small left pleural effusion. Right lower lung atelectasis versus infiltrate present. Stable appearance of jugular central line and large right-sided mediastinal thyroid goiter.  IMPRESSION: Slight reaccumulation of right pleural fluid with relatively small effusion present. There likely is a small left pleural effusion. Right lower lung atelectasis versus infiltrate.   Electronically Signed   By: Aletta Edouard M.D.   On: 09/22/2014 10:41   Dg Chest Port 1 View  09/21/2014   CLINICAL DATA:  Dyspnea. Pleural effusions. Large substernal goiter.  EXAM: PORTABLE CHEST - 1 VIEW  COMPARISON:  09/20/2014  FINDINGS: Central venous catheter is unchanged with the tip in the superior vena cava above the cavoatrial junction. Again noted is the large right-sided substernal goiter extending into the right hemi thorax. There is new  There is new slight interstitial pulmonary edema. Bilateral pleural effusions have slightly increased. Cardiomegaly is unchanged. Pulmonary vascularity appears slightly less prominent.  IMPRESSION: New interstitial edema.  Increasing  effusions.   Electronically Signed   By: Lorriane Shire M.D.   On: 09/21/2014 07:03   Dg Chest Port 1 View  09/21/2014   CLINICAL DATA:  Central line placement  EXAM: PORTABLE CHEST - 1 VIEW  COMPARISON:  09/20/2014  FINDINGS: Interval placement of a right central venous catheter. Tip is projected over the low SVC region. The course of the catheter is somewhat lateral than would be expected but this is likely due to vascular displacement by a large right peritracheal mass lesion. Small amount of subcutaneous emphysema in the right neck. Persistent cardiac enlargement with mild pulmonary vascular congestion. Bilateral pleural effusions with basilar atelectasis or infiltration bilaterally. Surgical clips in the axilla bilaterally. Calcification of the aorta. No pneumothorax.  IMPRESSION: Right central venous catheter appears to be in adequate position. Persistent large right peritracheal mass lesion. Cardiac enlargement with pulmonary vascular congestion and bilateral pleural effusions with basilar infiltration or atelectasis.   Electronically Signed   By: Lucienne Capers M.D.   On: 09/21/2014 00:30   Dg Chest Port 1 View  09/20/2014   CLINICAL DATA:  81 year old female status post right thoracentesis  EXAM: PORTABLE CHEST - 1 VIEW  COMPARISON:  Prior chest x-ray 09/19/2014  FINDINGS: Stable cardiomegaly. Atherosclerotic calcification again noted in the transverse aorta. Similar appearance of large right paratracheal mass. There may be slight interval reaccumulation of the right-sided pleural effusion. New patchy opacity in the right lung base. Inspiratory volumes are slightly lower. The left lung remains relatively clear. Surgical changes suggest prior bilateral mastectomies. No acute osseous abnormality.  IMPRESSION: 1. Interval development of patchy airspace opacity in the right lung base which may represent atelectasis, infiltrate or aspiration in the appropriate clinical setting. 2. Perhaps trace interval  reaccumulation of right pleural fluid. 3. Stable right paratracheal mass.   Electronically Signed   By: Jacqulynn Cadet M.D.   On: 09/20/2014 14:45   Dg Swallowing Func-speech Pathology  09/28/2014    Objective Swallowing Evaluation:    Patient Details  Name: Alison Gross MRN: [id number] Date of Birth: [birth year]  Today's Date: 09/28/2014 Time: SLP Start Time (ACUTE ONLY): 0915-SLP Stop Time (ACUTE ONLY): 5053 SLP Time Calculation (min) (ACUTE ONLY): 12 min  Past Medical History:  Past Medical History  Diagnosis Date  . Hypercholesterolemia     takes Crestor daily  . Thyroid disease     had iodine radiation  . Hypertension     takes Hyzaar daily  . Dysrhythmia     takes Carvedilol daily  . Pneumonia     history of;last time about 4-69yr ago  . GERD (gastroesophageal reflux disease)     takes Protonix daily  . Gastric ulcer   . History of blood transfusion   . History of colon polyps   . Anemia     takes iron pill daily  . Diabetes mellitus without complication     takes Tradjenta daily  . History of radiation therapy 07/12/12-08/26/12    right breast/  .  Paroxysmal atrial fibrillation     PCP EKG 09/27/2013: A. Fibrillation.. EKG 09/29/2013: S. Tach.  . Breast cancer 04/12/12    right-pos lymph node/left-DCIS  . Shortness of breath on exertion   . Bruit of left carotid artery   . Acute upper GI bleeding 01/24/2012  . MGUS (monoclonal gammopathy of unknown significance) 04/21/2013  . Osteoporosis 11/29/2013  . Recurrent right pleural effusion 07/26/2014  . Stage III chronic kidney disease 07/26/2014  . Goiter 07/27/2014  . Substernal goiter 12/23/2012  . Pleural effusion, right 12/23/2012  . Type 2 diabetes mellitus with renal manifestations 07/26/2014  . Dysphonia 09/20/2014  . Vocal cord paralysis     Suspected due to massive substernal goiter   Past Surgical History:  Past Surgical History  Procedure Laterality Date  . Carpal tunnel release      left  . Breast biopsy    . Esophagogastroduodenoscopy  01/24/2012     Procedure: ESOPHAGOGASTRODUODENOSCOPY (EGD);  Surgeon: Jeryl Columbia, MD;   Location: [facility];  Service: Endoscopy;  Laterality: N/A;  . Thyroid goiter removed    . Cyst removed from back of neck    . Knee surgery      right with rods  . Esophagogastroduodenoscopy    . Colonoscopy    . Total mastectomy Bilateral 05/10/2012    Procedure: RIGHT modified mastectomy; LEFT total mastectomy;  Surgeon:  [name], MD;  Location: [place];  Service: General;  Laterality:  Bilateral;  . Axillary sentinel node biopsy Right 05/10/2012    Procedure: AXILLARY SENTINEL lymph  NODE BIOPSY;  Surgeon: [name], MD;  Location: [place];  Service: General;  Laterality: Right;   nuclear medicine injection right side  7:00 am   . Abdominal hysterectomy      fibroids, with bilateral SO   HPI:  Other Pertinent Information: 81 yo female with hx of breast cancer, right  nodule goiter, HLD, afib on chronic anticoagulation, HTN, DM2 on tradjenta  and hx of recurrent R sided pleural effusions who presented to the ED with  subjectively worsening sob over the past several days per MD note. Pt is  s/p thoracentesis 6/16 yielding 800cc of fluid and thoracentesis during  this admission.  Chest imaging demonstrated moderate R sided effusion. Pt  remained without hypoxia or tachypnea. Swallow evaluation ordered.  Pt  reports dysphagia with food/drink over the last few months.  Has some  chronic dysphagia due to goiter and voice changes after goiter surgery  approximately 40 years ago.  Pt admits to weight loss but denies dysphagia  being source. Pt reports frequently choking on bulky items as well as with  liquids.   Pt has never had swallow evaluation completed.  CXR showed  development of right lobe infiltrate, ? aspiraton.    No Data Recorded  Assessment / Plan / Recommendation CHL IP CLINICAL IMPRESSIONS 09/28/2014  Therapy Diagnosis (None)  Clinical Impression Patient with mild oral and moderate pharyngeal motor  based dysphagia,  characterized by decreased base of tongue strength,  delayed swallow initiation, hyloaryngeal excursion and boney prominences  from her cervical spine that appear to decrease pharyngeal space.  These  impairments result in penetration and aspiration across consistencies as  well as moderate pharyngeal residue.  Chin tuck posture with small boluses  and use of hard swallows effectively protected airway and prevented  pharyngeal residue.  Recommend initiation of a Dys.2 texture diet with  thin liquids and strict use of aspiration precautions.  SLP will to  continue to follow.        CHL IP TREATMENT RECOMMENDATION 09/28/2014  Treatment Recommendations Therapy as outlined in treatment plan below     CHL IP DIET RECOMMENDATION 09/28/2014  SLP Diet Recommendations Dysphagia 2 (Fine chop);Thin  Liquid Administration via (None)  Medication Administration Whole meds with puree  Compensations Slow rate;Small sips/bites;Follow solids with liquid;Chin  tuck;Effortful swallow  Postural Changes and/or Swallow Maneuvers (None)     CHL IP OTHER RECOMMENDATIONS 09/28/2014  Recommended Consults (None)  Oral Care Recommendations Oral care BID  Other Recommendations (None)     CHL IP FOLLOW UP RECOMMENDATIONS 09/26/2014  Follow up Recommendations 24 hour supervision/assistance;Home health SLP     CHL IP FREQUENCY AND DURATION 09/28/2014  Speech Therapy Frequency (ACUTE ONLY) min 2x/week  Treatment Duration 1 week     Pertinent Vitals/Pain None    SLP Swallow Goals No flowsheet data found.  No flowsheet data found.    CHL IP REASON FOR REFERRAL 09/28/2014  Reason for Referral Objectively evaluate swallowing function     CHL IP ORAL PHASE 09/28/2014  Lips (None)  Tongue (None)  Mucous membranes (None)  Nutritional status (None)  Other (None)  Oxygen therapy (None)  Oral Phase WFL  Oral - Pudding Teaspoon (None)  Oral - Pudding Cup (None)  Oral - Honey Teaspoon (None)  Oral - Honey Cup (None)  Oral - Honey Syringe (None)  Oral - Nectar Teaspoon  (None)  Oral - Nectar Cup (None)  Oral - Nectar Straw (None)  Oral - Nectar Syringe (None)  Oral - Ice Chips (None)  Oral - Thin Teaspoon (None)  Oral - Thin Cup (None)  Oral - Thin Straw (None)  Oral - Thin Syringe (None)  Oral - Puree (None)  Oral - Mechanical Soft (None)  Oral - Regular (None)  Oral - Multi-consistency (None)  Oral - Pill (None)  Oral Phase - Comment (None)      CHL IP PHARYNGEAL PHASE 09/28/2014  Pharyngeal Phase Impaired  Pharyngeal - Pudding Teaspoon (None)  Penetration/Aspiration details (pudding teaspoon) (None)  Pharyngeal - Pudding Cup (None)  Penetration/Aspiration details (pudding cup) (None)  Pharyngeal - Honey Teaspoon (None)  Penetration/Aspiration details (honey teaspoon) (None)  Pharyngeal - Honey Cup (None)  Penetration/Aspiration details (honey cup) (None)  Pharyngeal - Honey Syringe (None)  Penetration/Aspiration details (honey syringe) (None)  Pharyngeal - Nectar Teaspoon (None)  Penetration/Aspiration details (nectar teaspoon) (None)  Pharyngeal - Nectar Cup (None)  Penetration/Aspiration details (nectar cup) (None)  Pharyngeal - Nectar Straw (None)  Penetration/Aspiration details (nectar straw) (None)  Pharyngeal - Nectar Syringe (None)  Penetration/Aspiration details (nectar syringe) (None)  Pharyngeal - Ice Chips (None)  Penetration/Aspiration details (ice chips) (None)  Pharyngeal - Thin Teaspoon (None)  Penetration/Aspiration details (thin teaspoon) (None)  Pharyngeal - Thin Cup (None)  Penetration/Aspiration details (thin cup) (None)  Pharyngeal - Thin Straw (None)  Penetration/Aspiration details (thin straw) (None)  Pharyngeal - Thin Syringe (None)  Penetration/Aspiration details (thin syringe') (None)  Pharyngeal - Puree (None)  Penetration/Aspiration details (puree) (None)  Pharyngeal - Mechanical Soft (None)  Penetration/Aspiration details (mechanical soft) (None)  Pharyngeal - Regular (None)  Penetration/Aspiration details (regular) (None)  Pharyngeal -  Multi-consistency (None)  Penetration/Aspiration details (multi-consistency) (None)  Pharyngeal - Pill (None)  Penetration/Aspiration details (pill) (None)  Pharyngeal Comment (None)      CHL IP CERVICAL ESOPHAGEAL PHASE 09/28/2014  Cervical Esophageal Phase WFL  Pudding Teaspoon (None)  Pudding Cup (None)  Honey Teaspoon (None)  Honey Cup (None)  Honey Straw (None)  Nectar Teaspoon (None)  Nectar Cup (None)  Nectar Straw (None)  Nectar Sippy Cup (None)  Thin Teaspoon (None)  Thin Cup (None)  Thin Straw (None)  Thin Sippy Cup (None)  Cervical Esophageal Comment (None)    CHL IP GO 09/20/2014  Functional Assessment Tool Used clinical judgement  Functional Limitations Swallowing  Swallow Current Status (N8295) CM  Swallow Goal Status (A2130) CL  Swallow Discharge Status (Q6578) (None)  Motor Speech Current Status (I6962) (None)  Motor Speech Goal Status (X5284) (None)  Motor Speech Goal Status (X3244) (None)  Spoken Language Comprehension Current Status (W1027) (None)  Spoken Language Comprehension Goal Status (O5366) (None)  Spoken Language Comprehension Discharge Status [phone]) (None)  Spoken Language Expression Current Status (V4259) (None)  Spoken Language Expression Goal Status (D6387) (None)  Spoken Language Expression Discharge Status [phone]) (None)  Attention Current Status (I9518) (None)  Attention Goal Status (A4166) (None)  Attention Discharge Status (A6301) (None)  Memory Current Status (S0109) (None)  Memory Goal Status (N2355) (None)  Memory Discharge Status (D3220) (None)  Voice Current Status (U5427) (None)  Voice Goal Status (C6237) (None)  Voice Discharge Status (S2831) (None)  Other Speech-Language Pathology Functional Limitation [phone]) (None)  Other Speech-Language Pathology Functional Limitation Goal Status (Y0737)  (None)  Other Speech-Language Pathology Functional Limitation Discharge Status  [phone]) (None)    Gunnar Fusi, M.A., CCC-SLP [phone]        [place] 09/28/2014, 11:11 AM    Dg  C-arm 1-60 Min-no Report  09/27/2014   CLINICAL DATA: surgery   C-ARM 1-60 MINUTES  Fluoroscopy was utilized by the requesting physician.  No radiographic  interpretation.    US Thoracentesis Asp Pleural Space W/img Guide  09/19/2014   INDICATION: Breast cancer, dyspnea, recurrent right pleural effusion. Request is made for diagnostic and therapeutic right thoracentesis.  EXAM: ULTRASOUND GUIDED DIAGNOSTIC AND THERAPEUTIC RIGHT THORACENTESIS  COMPARISON:  Prior thoracentesis on 08/24/2014  MEDICATIONS: None  COMPLICATIONS: None immediate  TECHNIQUE: Informed written consent was obtained from the patient after a discussion of the risks, benefits and alternatives to treatment. A timeout was performed prior to the initiation of the procedure.  Initial ultrasound scanning demonstrates a moderate to large right pleural effusion. The lower chest was prepped and draped in the usual sterile fashion. 1% lidocaine was used for local anesthesia.  An ultrasound image was saved for documentation purposes. A 6 Fr Safe-T-Centesis catheter was introduced. The thoracentesis was performed. The catheter was removed and a dressing was applied. The patient tolerated the procedure well without immediate post procedural complication. The patient was escorted to have an upright chest radiograph.  FINDINGS: A total of approximately 1.2 liters of hazy, amber fluid was removed. Requested samples were sent to the laboratory.  IMPRESSION: Successful ultrasound-guided diagnostic and therapeutic right sided thoracentesis yielding 1.2 liters of pleural fluid.  Read by: Rowe Robert, PA-C   Electronically Signed   By: Markus Daft M.D.   On: 09/19/2014 16:31        Subjective: Patient denies fevers, chills, headache, chest pain, dyspnea, nausea, vomiting, diarrhea, abdominal pain, dysuria, hematuria   Objective: Filed Vitals:   09/30/14 0350 09/30/14 0500 09/30/14 1101 09/30/14 1457  BP: 106/48   95/48  Pulse: 75   86  Temp: 98.4  F (36.9 C)   98.7 F (37.1 C)  TempSrc: Oral   Oral  Resp: 18   16  Height:      Weight:  58.3 kg (  128 lb 8.5 oz)    SpO2: 98%  100% 99%    Intake/Output Summary (Last 24 hours) at 09/30/14 1540 Last data filed at 09/30/14 1500  Gross per 24 hour  Intake    720 ml  Output    650 ml  Net     70 ml   Weight change: -0.622 kg (-1 lb 6 oz) Exam:   General:  Pt is alert, follows commands appropriately, not in acute distress  HEENT: No icterus, No thrush,[place]/AT  Cardiovascular: RRR, S1/S2, no rubs, no gallops  Respiratory: Bibasilar crackles, right greater than left. Diminished breath sound right base.  Abdomen: Soft/+BS, non tender, non distended, no guarding  Extremities: 1+LE edema, No lymphangitis, No petechiae, No rashes, no synovitis  Data Reviewed: Basic Metabolic Panel:  Recent Labs Lab 09/25/14 0400 09/26/14 0337 09/26/14 2219 09/27/14 0349 09/28/14 0238 09/29/14 0340  NA 142 139 139 140 141 141  K 3.7 3.9 3.7 3.8 4.0 3.6  CL 115* 112* 108 108 109 107  CO2 20* 21* '24 23 24 25  '$ GLUCOSE 129* 113* 120* 112* 130* 108*  BUN 29* 23* 22* 22* 22* 21*  CREATININE 1.52* 1.50* 1.50* 1.49* 1.50* 1.59*  CALCIUM 8.8* 9.1 9.0 8.9 9.1 9.1  MG 1.7  --   --   --   --   --    Liver Function Tests:  Recent Labs Lab 09/26/14 2219 09/29/14 0340  AST 21 16  ALT 16 12*  ALKPHOS 74 54  BILITOT 0.7 0.5  PROT 6.0* 5.7*  ALBUMIN 3.0* 2.9*   No results for input(s): LIPASE, AMYLASE in the last 168 hours. No results for input(s): AMMONIA in the last 168 hours. CBC:  Recent Labs Lab 09/26/14 2219 09/27/14 0349 09/28/14 1021 09/29/14 0340 09/30/14 0338  WBC 6.0 5.3 5.3 4.5 5.1  HGB 9.4* 8.6* 9.5* 8.3* 8.3*  HCT 28.9* 26.7* 29.1* 25.7* 25.3*  MCV 86.8 87.0 87.7 86.5 88.2  PLT 285 275 290 214 277   Cardiac Enzymes: No results for input(s): CKTOTAL, CKMB, CKMBINDEX, TROPONINI in the last 168 hours. BNP: Invalid input(s): POCBNP CBG:  Recent Labs Lab  09/29/14 1204 09/29/14 1626 09/29/14 2107 09/30/14 0641 09/30/14 1141  GLUCAP 95 89 115* 86 86    Recent Results (from the past 240 hour(s))  Culture, blood (routine x 2)     Status: None   Collection Time: 09/20/14  5:45 PM  Result Value Ref Range Status   Specimen Description BLOOD LEFT ARM  Final   Special Requests BOTTLES DRAWN AEROBIC AND ANAEROBIC 5CC  Final   Culture   Final    NO GROWTH 5 DAYS Performed at [facility]    Report Status 09/25/2014 FINAL  Final  Culture, blood (routine x 2)     Status: None   Collection Time: 09/20/14  5:54 PM  Result Value Ref Range Status   Specimen Description BLOOD LEFT HAND  Final   Special Requests BOTTLES DRAWN AEROBIC ONLY 1.5CC  Final   Culture   Final    NO GROWTH 5 DAYS Performed at [facility]    Report Status 09/25/2014 FINAL  Final  MRSA PCR Screening     Status: None   Collection Time: 09/20/14  6:45 PM  Result Value Ref Range Status   MRSA by PCR NEGATIVE NEGATIVE Final    Comment:        The GeneXpert MRSA Assay (FDA approved for NASAL specimens only), is one component  of a comprehensive MRSA colonization surveillance program. It is not intended to diagnose MRSA infection nor to guide or monitor treatment for MRSA infections.   Body fluid culture     Status: None   Collection Time: 09/27/14 11:44 AM  Result Value Ref Range Status   Specimen Description FLUID RIGHT PLEURAL  Final   Special Requests PART B  Final   Gram Stain   Final    WBC PRESENT,BOTH PMN AND MONONUCLEAR NO ORGANISMS SEEN CYTOSPIN SMEAR    Culture NO GROWTH 3 DAYS  Final   Report Status 09/30/2014 FINAL  Final     Scheduled Meds: . amiodarone  200 mg Oral BID  . anastrozole  1 mg Oral Q breakfast  . atorvastatin  10 mg Oral q1800  . coumadin book   Does not apply Once  . feeding supplement  1 Container Oral TID BM  . feeding supplement (ENSURE ENLIVE)  237 mL Oral BID BM  . ferrous sulfate  325 mg Oral TID WC  .  linagliptin  5 mg Oral Daily  . metoprolol tartrate  12.5 mg Oral QID  . pantoprazole  40 mg Oral Daily  . polyethylene glycol  17 g Oral Daily  . warfarin  5 mg Oral ONCE-1800  . warfarin   Does not apply Once  . Warfarin - Pharmacist Dosing Inpatient   Does not apply q1800   Continuous Infusions: . heparin 1,150 Units/hr (09/30/14 0801)     Kenzli Barritt, DO  [facility] Pager [phone]  If 7PM-7AM, please contact night-coverage [email or web address] Password [email or web address] 09/30/2014, 3:40 PM   LOS: 9 days

## 2014-09-30 NOTE — Progress Notes (Signed)
ANTICOAGULATION CONSULT NOTE - Follow Up Consult  Pharmacy Consult for heparin Indication: atrial fibrillation and RUE DVT  No Known Allergies  Patient Measurements: Height: '5\' 5"'$  (165.1 cm) Weight: 128 lb 8.5 oz (58.3 kg) IBW/kg (Calculated) : 57 Heparin Dosing Weight: 58.5 kg  Vital Signs: Temp: 98.4 F (36.9 C) (07/23 0350) Temp Source: Oral (07/23 0350) BP: 106/48 mmHg (07/23 0350) Pulse Rate: 75 (07/23 0350)  Labs:  Recent Labs  09/28/14 0238  09/28/14 1021 09/29/14 0340 09/30/14 0338  HGB  --   < > 9.5* 8.3* 8.3*  HCT  --   --  29.1* 25.7* 25.3*  PLT  --   --  290 214 277  LABPROT  --   --   --   --  15.9*  INR  --   --   --   --  1.26  HEPARINUNFRC 0.31  --  0.47 0.38 0.27*  CREATININE 1.50*  --   --  1.59*  --   < > = values in this interval not displayed.  Estimated Creatinine Clearance: 25.8 mL/min (by C-G formula based on Cr of 1.59).  Assessment: 81 y/o F s/p PleurX catheter placement and on heparin at 1050 units/hr for afib and DVT (New RUE DVT on 07/16). Heparin level below goal (HL= 0.27), Hgb 8.3, plts 277.  Xarelto PTA but due to renal function on coumadin (started 7/22; day 2 of overlap). INR today= 1.26  Goal of Therapy:  Heparin level 0.3-0.7 units/ml Monitor platelets by anticoagulation protocol: Yes   Plan:  -Increase heparin to 1150 units/hr -Heparin level and CBC in am -Coumadin '5mg'$  po today -Daily PT/INR  Hildred Laser, Pharm D 09/30/2014 8:00 AM

## 2014-09-30 NOTE — Progress Notes (Signed)
Subjective:  Denies any chest pain states breathing is slowly improving. Sinus rhythm on the monitor  Objective:  Vital Signs in the last 24 hours: Temp:  [98.4 F (36.9 C)-99.2 F (37.3 C)] 98.4 F (36.9 C) (07/23 0350) Pulse Rate:  [75-89] 75 (07/23 0350) Resp:  [18] 18 (07/23 0350) BP: (106-114)/(48-64) 106/48 mmHg (07/23 0350) SpO2:  [98 %-100 %] 98 % (07/23 0350) Weight:  [58.3 kg (128 lb 8.5 oz)] 58.3 kg (128 lb 8.5 oz) (07/23 0500)  Intake/Output from previous day: 07/22 0701 - 07/23 0700 In: 960 [P.O.:960] Out: 650 [Chest Tube:650] Intake/Output from this shift:    Physical Exam: Neck: no adenopathy, no carotid bruit, no JVD and supple, symmetrical, trachea midline Lungs: Decreased breath sound at bases Heart: regular rate and rhythm, S1, S2 normal and Soft systolic murmur noted Abdomen: soft, non-tender; bowel sounds normal; no masses,  no organomegaly Extremities: extremities normal, atraumatic, no cyanosis or edema  Lab Results:  Recent Labs  09/29/14 0340 09/30/14 0338  WBC 4.5 5.1  HGB 8.3* 8.3*  PLT 214 277    Recent Labs  09/28/14 0238 09/29/14 0340  NA 141 141  K 4.0 3.6  CL 109 107  CO2 24 25  GLUCOSE 130* 108*  BUN 22* 21*  CREATININE 1.50* 1.59*   No results for input(s): TROPONINI in the last 72 hours.  Invalid input(s): CK, MB Hepatic Function Panel  Recent Labs  09/29/14 0340  PROT 5.7*  ALBUMIN 2.9*  AST 16  ALT 12*  ALKPHOS 54  BILITOT 0.5   No results for input(s): CHOL in the last 72 hours. No results for input(s): PROTIME in the last 72 hours.  Imaging: Imaging results have been reviewed and No results found.  Cardiac Studies:  Assessment/Plan:  Resolving Diastolic heart failure, acute on chronic Status post Paroxysmal Atrial fibrillation Right breast cancer in remission Hyperlipidemia Hypertension DM, II Recurrent pleural effusions Large substernal thyroid goiter Hypertrophic cardiomyopathy with mild LV  outflow obstruction Quadricuspid AV without stenosis Dilated LA and RA Small Pericardial effusion Iron deficiency anemia Emphysema Hypoalbuminemia Plan Continue present management  LOS: 9 days    Charolette Forward 09/30/2014, 11:02 AM

## 2014-09-30 NOTE — Progress Notes (Signed)
Physical Therapy Evaluation Patient Details Name: Alison Gross MRN: [id number] DOB: 01/16/1933 Today's Date: 09/30/2014   History of Present Illness  81 yo female with hx of breast cancer, right nodule goiter, HLD, afib on chronic anticoagulation, HTN, DM2 on tradjenta and hx of recurrent R sided pleural effusions who presented to the ED with subjectively worsening sob over the past several days per MD note. Pt is s/p thoracentesis 6/16 yielding 800cc of fluid and thoracentesis during this admission. Chest imaging demonstrated moderate R sided effusion. Pt remained without hypoxia or tachypnea.  Clinical Impression  Pt admitted with above diagnosis. Pt currently with functional limitations due to the deficits listed below (see PT Problem List). Pt moving well however noticeable generalized weakness and increase fatigue with ambulation.  Pt will benefit from skilled PT to increase their independence and safety with mobility to allow discharge to the venue listed below.       Follow Up Recommendations No PT follow up    Equipment Recommendations  None recommended by PT    Recommendations for Other Services       Precautions / Restrictions Precautions Precautions: None Restrictions Weight Bearing Restrictions: No      Mobility  Bed Mobility Overal bed mobility: Needs Assistance Bed Mobility: Supine to Sit     Supine to sit: Min guard     General bed mobility comments: Pt needs extra time to complete with cues for proper LE and UE placement.   Transfers Overall transfer level: Needs assistance Equipment used: None Transfers: Sit to/from Stand Sit to Stand: Supervision         General transfer comment: Needs supervision for safety and need for extra time to complete  Ambulation/Gait Ambulation/Gait assistance: Min guard Ambulation Distance (Feet): 200 Feet Assistive device: None Gait Pattern/deviations: Step-through pattern;Decreased stride length;Shuffle Gait  velocity: decreased   General Gait Details: Pt with slow cadence and guarded gait.  Pt increase SOB 2/4 on dyspnea scale however vitals maitnained 100% O2 on RA and HR 94  Stairs            Wheelchair Mobility    Modified Rankin (Stroke Patients Only)       Balance Overall balance assessment: Needs assistance Sitting-balance support: Feet supported Sitting balance-Leahy Scale: Good       Standing balance-Leahy Scale: Fair Standing balance comment: Needs minguard for safety                             Pertinent Vitals/Pain Pain Assessment: No/denies pain    Home Living Family/patient expects to be discharged to:: Private residence Living Arrangements: Spouse/significant other Available Help at Discharge: Family Type of Home: House Home Access: Level entry     Home Layout: Two level Home Equipment: None      Prior Function Level of Independence: Independent               Hand Dominance   Dominant Hand: Right    Extremity/Trunk Assessment               Lower Extremity Assessment: Generalized weakness         Communication   Communication: No difficulties  Cognition Arousal/Alertness: Awake/alert Behavior During Therapy: WFL for tasks assessed/performed Overall Cognitive Status: Within Functional Limits for tasks assessed                      General Comments      Exercises  Assessment/Plan    PT Assessment Patient needs continued PT services  PT Diagnosis Generalized weakness   PT Problem List Decreased strength;Decreased activity tolerance;Decreased mobility  PT Treatment Interventions Gait training;Therapeutic activities;Therapeutic exercise;Balance training;Patient/family education   PT Goals (Current goals can be found in the Care Plan section) Acute Rehab PT Goals Patient Stated Goal: To get stronger and go home PT Goal Formulation: With patient/family Time For Goal Achievement:  10/07/14 Potential to Achieve Goals: Good    Frequency Min 3X/week   Barriers to discharge        Co-evaluation               End of Session Equipment Utilized During Treatment: Gait belt Activity Tolerance: Patient tolerated treatment well Patient left: in chair;with call bell/phone within reach Nurse Communication: Mobility status         Time: 2446-2863 PT Time Calculation (min) (ACUTE ONLY): 27 min   Charges:   PT Evaluation $Initial PT Evaluation Tier I: 1 Procedure PT Treatments $Gait Training: 8-22 mins   PT G Codes:        Euleta Belson 25-Oct-2014, 10:27 AM  Antoine Poche, [place] DPT [phone]

## 2014-10-01 LAB — GLUCOSE, CAPILLARY
GLUCOSE-CAPILLARY: 87 mg/dL (ref 65–99)
Glucose-Capillary: 102 mg/dL — ABNORMAL HIGH (ref 65–99)
Glucose-Capillary: 108 mg/dL — ABNORMAL HIGH (ref 65–99)

## 2014-10-01 LAB — CBC
HCT: 26.2 % — ABNORMAL LOW (ref 36.0–46.0)
Hemoglobin: 8.5 g/dL — ABNORMAL LOW (ref 12.0–15.0)
MCH: 28.1 pg (ref 26.0–34.0)
MCHC: 32.4 g/dL (ref 30.0–36.0)
MCV: 86.5 fL (ref 78.0–100.0)
Platelets: 306 10*3/uL (ref 150–400)
RBC: 3.03 MIL/uL — AB (ref 3.87–5.11)
RDW: 16.5 % — AB (ref 11.5–15.5)
WBC: 4.7 10*3/uL (ref 4.0–10.5)

## 2014-10-01 LAB — BASIC METABOLIC PANEL
ANION GAP: 6 (ref 5–15)
BUN: 21 mg/dL — ABNORMAL HIGH (ref 6–20)
CHLORIDE: 102 mmol/L (ref 101–111)
CO2: 28 mmol/L (ref 22–32)
CREATININE: 1.54 mg/dL — AB (ref 0.44–1.00)
Calcium: 9.2 mg/dL (ref 8.9–10.3)
GFR calc Af Amer: 36 mL/min — ABNORMAL LOW (ref >60)
GFR calc non Af Amer: 31 mL/min — ABNORMAL LOW (ref >60)
Glucose, Bld: 111 mg/dL — ABNORMAL HIGH (ref 65–99)
Potassium: 3.8 mmol/L (ref 3.5–5.1)
SODIUM: 136 mmol/L (ref 135–145)

## 2014-10-01 LAB — HEPARIN LEVEL (UNFRACTIONATED)
Heparin Unfractionated: 0.55 IU/mL (ref 0.30–0.70)
Heparin Unfractionated: 0.47 IU/mL (ref 0.30–0.70)

## 2014-10-01 LAB — PROTIME-INR
INR: 1.41 (ref 0.00–1.49)
Prothrombin Time: 17.3 seconds — ABNORMAL HIGH (ref 11.6–15.2)

## 2014-10-01 MED ORDER — WARFARIN SODIUM 5 MG PO TABS
5.0000 mg | ORAL_TABLET | Freq: Once | ORAL | Status: AC
  Administered 2014-10-01: 5 mg via ORAL
  Filled 2014-10-01: qty 1

## 2014-10-01 NOTE — Progress Notes (Addendum)
ANTICOAGULATION CONSULT NOTE - Follow Up Consult  Pharmacy Consult for heparin Indication: atrial fibrillation and RUE DVT  No Known Allergies  Patient Measurements: Height: '5\' 5"'$  (165.1 cm) Weight: 125 lb 3.5 oz (56.8 kg) IBW/kg (Calculated) : 57 Heparin Dosing Weight: 58.5 kg  Vital Signs: Temp: 98.4 F (36.9 C) (07/24 0608) Temp Source: Oral (07/24 0608) BP: 111/50 mmHg (07/24 0608) Pulse Rate: 76 (07/24 0608)  Labs:  Recent Labs  09/29/14 0340 09/30/14 0338 10/01/14 0335  HGB 8.3* 8.3* 8.5*  HCT 25.7* 25.3* 26.2*  PLT 214 277 306  LABPROT  --  15.9* 17.3*  INR  --  1.26 1.41  HEPARINUNFRC 0.38 0.27* 0.55  CREATININE 1.59*  --  1.54*    Estimated Creatinine Clearance: 26.6 mL/min (by C-G formula based on Cr of 1.54).  Assessment: 81 y/o F s/p PleurX catheter placement and on heparin at 1050 units/hr for afib and DVT (New RUE DVT on 07/16). Heparin level at goal (HL= 0.55), Hgb 8.5, plts 306.  Xarelto PTA but due to renal function on coumadin (started 7/22; day 3 of overlap). INR is 1.41 after two doses of warfarin '5mg'$ . Will continue '5mg'$  dose tonight but consider reduced dose in future with amiodarone on board.  Repeat HL is therapeutic at 0.55 on heparin 1150 units/hr. No issues with infusion or bleeding noted.  Goal of Therapy:  Heparin level 0.3-0.7 units/ml Monitor platelets by anticoagulation protocol: Yes   Plan:  -Continue heparin 1150 units/hr -Heparin level and CBC in am -Coumadin '5mg'$  tonight x1 -Daily PT/INR  Andrey Cota. Diona Foley, PharmD Clinical Pharmacist Pager [phone] 10/01/2014 10:32 AM

## 2014-10-01 NOTE — Progress Notes (Signed)
Subjective:  Patient denies any chest pain states breathing much better this morning. Tolerating Coumadin okay no hemoptysis.  Objective:  Vital Signs in the last 24 hours: Temp:  [98.4 F (36.9 C)-98.7 F (37.1 C)] 98.4 F (36.9 C) (07/24 4239) Pulse Rate:  [76-86] 76 (07/24 0608) Resp:  [15-17] 15 (07/24 0608) BP: (93-111)/(46-50) 111/50 mmHg (07/24 0608) SpO2:  [97 %-100 %] 97 % (07/24 0608) Weight:  [56.8 kg (125 lb 3.5 oz)] 56.8 kg (125 lb 3.5 oz) (07/24 0400)  Intake/Output from previous day: 07/23 0701 - 07/24 0700 In: 360 [P.O.:360] Out: 950 [Urine:350; Chest Tube:600] Intake/Output from this shift:    Physical Exam: Neck: no adenopathy, no carotid bruit, no JVD and supple, symmetrical, trachea midline Lungs: Decreased breath sound at bases Heart: regular rate and rhythm, S1, S2 normal and Soft systolic murmur noted Abdomen: soft, non-tender; bowel sounds normal; no masses,  no organomegaly Extremities: No clubbing cyanosis 1+ edema noted  Lab Results:  Recent Labs  09/30/14 0338 10/01/14 0335  WBC 5.1 4.7  HGB 8.3* 8.5*  PLT 277 306    Recent Labs  09/29/14 0340 10/01/14 0335  NA 141 136  K 3.6 3.8  CL 107 102  CO2 25 28  GLUCOSE 108* 111*  BUN 21* 21*  CREATININE 1.59* 1.54*   No results for input(s): TROPONINI in the last 72 hours.  Invalid input(s): CK, MB Hepatic Function Panel  Recent Labs  09/29/14 0340  PROT 5.7*  ALBUMIN 2.9*  AST 16  ALT 12*  ALKPHOS 54  BILITOT 0.5   No results for input(s): CHOL in the last 72 hours. No results for input(s): PROTIME in the last 72 hours.  Imaging: Imaging results have been reviewed and No results found.  Cardiac Studies:  Assessment/Plan:  Resolving Diastolic heart failure, acute on chronic Status post Paroxysmal Atrial fibrillation Right breast cancer in remission Hyperlipidemia Hypertension DM, II Recurrent pleural effusions Large substernal thyroid goiter Hypertrophic  cardiomyopathy with mild LV outflow obstruction Quadricuspid AV without stenosis Dilated LA and RA Small Pericardial effusion Iron deficiency anemia Emphysema Hypoalbuminemia Plan Continue present management. Monitored PT INR  LOS: 10 days    Charolette Forward 10/01/2014, 9:15 AM

## 2014-10-01 NOTE — Progress Notes (Signed)
PROGRESS NOTE  CANDITA BORENSTEIN LGX:[id number] DOB: [birth year] DOA: 09/19/2014 PCP: Maximino Greenland, MD  Brief Summary  GWENDALYNN ECKSTROM is a 81 y.o. female with a hx of goiter, HLD, afib on chronic anticoagulation, chronic CHF, HTN, DM2 on tradjenta, breast cancer in remission, MGUS, and hx of recurrent R sided pleural effusions who presents to the [facility] ED on 7/12 with worsening sob over the past several days. Pt is s/p thoracentesis 6/16 yielding 800cc. In ED, chest imaging demonstrated moderate R sided effusion. Of note, patient does have a known goiter. Family reported pt had been having more difficulty swallowing foods recently. Essentially, she has had a chronic goiter, part of which was removed in the 1970s (external portion). It is large, intrathoracic and was probably the cause of her chronic right effusion which she tolerated for many years. She has had some marginal enlargement in the last few months which has led to dyspnea requiring thoracentesis, dysphonia, and dysphagia. Ultimately, she needs treatment for her goiter or she will need palliative measures such as pleurex. Options being explored are surgical removal, radiation. As per TCTS, high risk for major surgery and hence plans for right Pleurx catheter placement on 7/20.  STUDIES:  6/16 Rt pleural fluid >> 800 ml, protein 3.2, LDH 49, WBC 614 (84%L), atypical cells present on cytology but were finally reported as reactive mesothelial cells 7/11 PET scan >> moderate/large Rt pleural effusion, massive thyroid goiter 7/12 ADMIT 7/12 CT chest >> third spacing of fluid, Large Rt pleural effusion, very large thyroid, centrilobular emphysema 7/12 Rt thoracentesis with removal of 1200 mL 7/13 developed hypotension and transferred to ICU requiring IVF and vasopressors, Speech assessment >> stage 1 baby food 7/14 weaned off vasopressors 7/14 CT head/neck >> massive Rt thyroid, mod/large Rt effusion 7/14 Echo >>  severe LVH, EF 55 to 81%, grade 2 diastolic dysfx, severe LA dilation, mod RA dilation, small/mod pericardial effusion 7/15: Right IJ TLC removed 7/16: DVT in the RUE, transfer to [facility] for cardiothoracic surgery consultation in hospital, tapering hydrocortisone 7/20: R-Pleurx placed 7/22: start warfarin  Assessment/Plan  Goiter, large and compressing the right lung and likely contributing to her effusion, swallowing and speech difficulties. See above. TSH 1.115 on 6/15 and fT3 and fT4 wnl - Status post remote partial thyroidectomy and RAI in 2015. Reviewed old records and patient had seen Dr. Chalmers Cater, endocrinology in March 2013 - Patient was transferred from [facility] to [facility] on 7/16 for thoracic surgery opinion regarding goiter surgery. - As per Dr. Virgie Dad discussion with radiation oncology-not appropriate for radiation treatment - 2 TCTS surgeons have evaluated patient and determined that she is high risk candidate for major surgery i.e. thyroidectomy.  -right Pleurx catheter placement 7/20 for recurrent symptomatic pleural effusion.  Recurrent right pleural effusion  - etiology unclear -likely secondary to her goiter vs CHF - transudate by [employer] Criteria but had WBC 501 - Status post thoracentesis by interventional radiology on 7/12 - Cytology from 6/16 and 7/12 both with reactive mesothelial cells - Pleural fluid Cx NG - Pleurx 7/20. -Dr. Algis Liming discussed with Dr. [name] on 7/19 and he recommended repeating Echo to reassess Pericardial fluid -2000cc drainage from Pleurx since placement on 09/27/14 -change drainage schedule to q 48hrs, next drainage of Pleurx on 7/25  Acute on Chronic diastolic heart failure - Significantly volume overloaded. Started IV Lasix 7/17-dosed daily based on BMP - daily weight - lasix (started 7/17)--has received  4 doses -Neg 11 pounds since admission -further dosing per cardiology-->2 po doses lasix 7/21-7/22 -7/23--d/c  foley  Paroxysmal atrial fibrillation,  - Patient had A. fib with RVR overnight 7/16 and was started on Cardizem drip and titrate up to 10 mg/h. She reverted to sinus rhythm. Remains in sinus rhythm. Cardiology has changed to oral Cardizem 7/16. -d/c diltiazem as pt has pt only received one dose in past 48 hours due to soft BPs -decreased metoprolol dosing to 12.5 mg q6 due to soft BP -Due to the patient's EKG stage IV, unable to use factor Xa inhibitors. We'll need to continue heparin bridge with initiation of warfarin. 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 The case was discussed by Dr. Sheran Fava with Dr. Wilburn Cornelia from ENT who will follow up as outpatient for evaluation - Speech therapy  - follow-up with ENT in clinic   Dysphagia, likely secondary to large goiter. Got choked up on dysphagia 2 diet. - 7/21 MBS-->recommended dys 2 diet with thin liquid per speech  Hypotension, was likely secondary to intravascular volume depletion/dehydration and relative adrenal insufficiency. She was given IVF and started on stress dose steroids. - Off stress dose steroids since 7/17 - resolved. Now intermittent soft blood pressures may be related to Cardizem and furosemide.  Acute DVT in RUE secondary to central line - Central line removed on 7/15 - 7/22-start warfarin with heparin bridge  Elevated troponin, chest pain-free,  -EKG demonstrates no acute ST segment elevation or T-wave inversions. She did previously have some T-wave inversions in the lateral leads and some mild ST segment elevations in the anterior leads which are stable.  -Stable elevation due to CKD and effusion. - Cardiology evaluated and performed a stress test on 7/18: Negative  Diabetes mellitus type 2 with well controlled blood sugars,  - A1c 6.2 On 07/27/14 -CBGs well controlled-->d/c SSI and CBG  checks - continue Tradjenta  Right-sided breast cancer in remission,  -no evidence of recurrence on recent PET scan--09/18/14 - Continue arimidex - Dr. Virgie Dad courtesy f/u appreciated.  Hyperlipidemia, stable, continue atorvastatin  CKD stage III-IV,  -Creatinine clearance less than 30  - Minimize nephrotoxicity and renally dose medications - Creatinine gradually improving over the last 5 days. - baseline creatinine 1.3-1.6  Hypercalcemia secondary to dehydration and possible thyroid abnormalities (nonparathyroid hypercalcemia), resolved.  - Vitamin D 54, PTHrp wnl, PTH < 10  Iron deficiency anemia superimposed on MGUS. - continue iron supplementation - B12, folate, and TSH wnl - Hemoglobin stable.  Moderate malnutrition - Nutrition  - supplements  Constipation  - Continue miralax - Bisacodyl suppository - Lactulose 30gm once  NSVT 1 on 7/17 - Continue monitoring on telemetry. Try to keep K >4 & magnesium >2.    Diet: Dysphagia 2 Access: PIV IVF: off Proph: Heparin gtt   Code Status: full  Family Communication: Discussed with patient's spouse at bedside on 7/24 Disposition Plan: home 7/25    Procedures/Studies: Dg Chest 1 View  09/19/2014   CLINICAL DATA:  Status post right thoracentesis  EXAM: CHEST  1 VIEW  COMPARISON:  09/19/2014  FINDINGS: Negative for pneumothorax following right thoracentesis. Trace right effusion remains. Improved right middle and lower lobe aeration with some residual atelectasis. Large mediastinal mass along the right peritracheal region compatible with a known massive thyroid goiter. Left lung remains clear. Stable cardiomegaly and vascularity. No superimposed edema.  IMPRESSION: Negative  for pneumothorax following right thoracentesis. Decrease in the right effusion. Otherwise stable exam.   Electronically Signed   By: Jerilynn Mages.  Shick M.D.   On: 09/19/2014 16:44   Dg Chest 2 View  09/27/2014   CLINICAL DATA:   Shortness of breath and cough  EXAM: CHEST  2 VIEW  COMPARISON:  Chest radiograph September 26, 2014; chest CT September 19, 2014  FINDINGS: Pleural effusions bilaterally are stable. There is patchy consolidation in the bases, also stable. No new parenchymal lung opacity identified. The large right-sided thyroid goiter remains with deviation of the lower thoracic trachea toward the left. The heart size is within normal limits. Pulmonary vascularity is within normal limits. There are surgical clips in both axillary regions as well as in the right peritracheal region, stable. There is stable degenerative change in the thoracic spine.  IMPRESSION: No no appreciable change from 1 day prior. Bilateral effusions with bibasilar patchy airspace consolidation remain. This consolidation is felt to be largely due to atelectasis, although superimposed pneumonia must be of concern. No change in cardiac silhouette. Large right lobe thyroid goiter remains, stable. Tracheal deviation is likewise unchanged.   Electronically Signed   By: [name] III M.D.   On: 09/27/2014 07:35   Dg Chest 2 View  09/26/2014   CLINICAL DATA:  80 year old female with pleural effusion  EXAM: CHEST  2 VIEW  COMPARISON:  Chest radiograph dated 09/24/2014 and CT dated 09/19/2014  FINDINGS: Two views of the chest demonstrate emphysematous changes. There are moderate right and small left pleural effusion. There is associated partial compressive atelectasis of the right lower lobe. Superimposed pneumonia is not excluded. There is no pneumothorax. Large right thyroid mass again noted with mass effect and mild deviation of trachea to the left. There is degenerative changes of the spine.  IMPRESSION: No significant interval change in size of the bilateral pleural effusions.  Large right thyroid nodule.   Electronically Signed   By: Anner Crete M.D.   On: 09/26/2014 21:54   Dg Chest 2 View  09/19/2014   CLINICAL DATA:  Increasing shortness of breath.  Wheezing. Onset of symptoms last night.  EXAM: CHEST  2 VIEW  COMPARISON:  CT 09/18/2014.  FINDINGS: RIGHT paratracheal mass compatible with substernal goiter. Moderate RIGHT pleural effusion with compressive atelectasis. These findings appear similar to the CT yesterday. Aortic arch atherosclerosis. Axillary surgical clips bilaterally. Monitoring leads project over the chest. Cardiopericardial silhouette appears unchanged compared to prior chest radiographs. Lung volumes are lower than on previous study 08/24/2014.  IMPRESSION: 1. Moderate RIGHT pleural effusion with compressive/relaxation atelectasis of the RIGHT lower lobe and RIGHT middle lobe. 2. Large RIGHT peritracheal mass compatible with substernal goiter evaluated on prior CT. 3. Lower lung volumes than on prior chest radiographs.   Electronically Signed   By: Dereck Ligas M.D.   On: 09/19/2014 08:11   Ct Soft Tissue Neck Wo Contrast  09/21/2014   CLINICAL DATA:  81 year old female with difficulty swallowing. Initial encounter. Right pleural effusion treated with thoracentesis 2 days ago. History of thyroid goiter. History of treated breast cancer in 2014.  EXAM: CT NECK WITHOUT CONTRAST  TECHNIQUE: Multidetector CT imaging of the neck was performed following the standard protocol without intravenous contrast.  COMPARISON:  PET-CT 09/18/2014, 10/28/2013.  Chest CT 10/28/2013.  FINDINGS: Chronic massive right thyroid lobe enlargement, with the vast majority of the enlarged right lobe occupying the mediastinum and right hemithorax.  Mediastinal involvement by right thyroid goiter seen as far  back as 2004 (with about 7 cm of right thyroid lobe in the mediastinum at that time).  Currently the right lobe extends as far caudally as the right mainstem bronchus and encompasses approximately 8.0 by 9.8 x 15.0 cm (AP by transverse by CC), with an estimated volume of 588 mL. Compare that to a normal splenic volume of no larger than 412 mL.  Chronic  heterogeneous density throughout the enlarged lobe. The left thyroid lobe is also chronically but more modestly enlarged encompassing about 4.3 x 3.1 x 6.4 cm (AP by transverse by CC). Despite the marked thyroid enlargement there is no significant mass effect on the trachea, carina, or right mainstem.  Asymmetry of the larynx suggests right vocal cord paralysis. No laryngeal or pharyngeal soft tissue mass is evident. Negative parapharyngeal, retropharyngeal, and sublingual spaces. Negative non contrast submandibular and parotid glands. No cervical lymphadenopathy identified in the absence of contrast. Negative visualized noncontrast brain parenchyma. Postoperative changes to the globes.  Chronic sphenoid sinus periosteal thickening, but Visualized paranasal sinuses and mastoids are clear. Absent dentition. No acute or suspicious osseous lesion identified. Small sclerotic focus in the right manubrium is stable since 2004.  Right IJ approach central line in place.  Layering moderate to large right pleural effusion and smaller left pleural effusion. Compressive atelectasis in both lungs greater on the right.  IMPRESSION: 1. Massive chronic enlargement of the right thyroid lobe, with the vast majority of the enlarged lobe occupying the mediastinum and right hemithorax as far down as the right mainstem bronchus. See details above. 2. Despite this, no significant mass effect on the airway or trachea. 3. Layering moderate to large right and smaller left pleural effusions with right greater than left pulmonary compressive atelectasis. 4. Suggestion of right vocal cord paralysis. No laryngeal/pharyngeal mass or cervical lymphadenopathy identified in the absence of contrast.   Electronically Signed   By: Genevie Ann M.D.   On: 09/21/2014 10:06   Ct Chest Wo Contrast  09/19/2014   CLINICAL DATA:  Right breast cancer. Increasing shortness of breath.  EXAM: CT CHEST WITHOUT CONTRAST  TECHNIQUE: Multidetector CT imaging of the  chest was performed following the standard protocol without IV contrast.  COMPARISON:  Multiple exams, including 09/18/2014 and 09/19/2014  FINDINGS: Mediastinum/Nodes: Prominent thyroid goiter with massively enlarged right lobe extending into the right hemithorax and measuring approximately 13.9 by 8.0 by 7.1 cm.  Diffuse subcutaneous edema. Aortic arch and branch vessel atherosclerosis. Low-level stranding in the mediastinum with small to moderate pericardial effusion.  Lungs/Pleura: Right pleural effusion occupies greater than 50% of right hemithoracic volume and is associated with passive atelectasis.  Centrilobular emphysema. Old granulomatous disease. Trace left pleural effusion.  Upper abdomen: Stranding in the intra-abdominal adipose tissue.  Musculoskeletal: Thoracic spondylosis.  IMPRESSION: 1. Considerable third spacing of fluid with subcutaneous, mediastinal, and upper abdominal infiltrative edema. 2. Large right and trace left pleural effusions. 3. Very large thyroid goiter, with masslike extension of the right lobe into the right hemithorax as detailed on prior exams. 4. Centrilobular emphysema.   Electronically Signed   By: Van Clines M.D.   On: 09/19/2014 09:57   Nm Myocar Multi W/spect W/wall Motion / Ef  09/25/2014    Blood pressure demonstrated a hypertensive response to exercise.  Horizontal ST segment depression ST segment depression was noted during  stress in the II, III and aVF leads, beginning at 1 minutes of stress,  ending at 6 minutes of stress, and returning to baseline after 5-9  minutes  of recovery.   CLINICAL DATA:  81 year old female with a history of chest pain.  Cardiovascular risk factors include diabetes, hyperlipidemia.  EXAM: MYOCARDIAL IMAGING WITH SPECT (REST AND PHARMACOLOGIC-STRESS)  GATED LEFT VENTRICULAR WALL MOTION STUDY  LEFT VENTRICULAR EJECTION FRACTION  TECHNIQUE: Standard myocardial SPECT imaging was performed after resting intravenous injection of 10  mCi Tc-70msestamibi. Subsequently, intravenous infusion of Lexiscan was performed under the supervision of the Cardiology staff. At peak effect of the drug, 30 mCi [id number] was injected intravenously and standard myocardial SPECT imaging was performed. Quantitative gated imaging was also performed to evaluate left ventricular wall motion, and estimate left ventricular ejection fraction.  COMPARISON:  None.  FINDINGS: Perfusion: Small fixed defect at the inferior lateral wall on rest and perfusion images. No reversible ischemia identified.  Wall Motion: Normal left ventricular wall motion. No left ventricular dilation.  Left Ventricular Ejection Fraction: 62 %  End diastolic volume 71 ml  End systolic volume 27 ml  IMPRESSION: 1. Small fixed defect at the inferior lateral wall. No reversible ischemia identified.  2. Normal left ventricular wall motion.  3. Left ventricular ejection fraction 62%  4. Low-risk stress test findings*.  *2012 Appropriate Use Criteria for Coronary Revascularization Focused Update: J Am Coll Cardiol. 201610;96(0):[id number]://content.[email or web address]?[email or web address]   Electronically Signed   By: JaCorrie Mckusick.O.   On: 09/25/2014 14:24    09/25/2014   CLINICAL DATA:  [age]ear old female with a history of chest pain.  Cardiovascular risk factors include diabetes, hyperlipidemia.  EXAM: MYOCARDIAL IMAGING WITH SPECT (REST AND PHARMACOLOGIC-STRESS)  GATED LEFT VENTRICULAR WALL MOTION STUDY  LEFT VENTRICULAR EJECTION FRACTION  TECHNIQUE: Standard myocardial SPECT imaging was performed after resting intravenous injection of 10 mCi [id number]. Subsequently, intravenous infusion of Lexiscan was performed under the supervision of the Cardiology staff. At peak effect of the drug, 30 mCi Tc-89m74mtamibi was injected intravenously and standard myocardial SPECT imaging was performed. Quantitative gated imaging was also performed to evaluate left ventricular wall motion,  and estimate left ventricular ejection fraction.  COMPARISON:  None.  FINDINGS: Perfusion: Small fixed defect at the inferior lateral wall on rest and perfusion images. No reversible ischemia identified.  Wall Motion: Normal left ventricular wall motion. No left ventricular dilation.  Left Ventricular Ejection Fraction: 62 %  End diastolic volume 71 ml  End systolic volume 27 ml  IMPRESSION: 1. Small fixed defect at the inferior lateral wall. No reversible ischemia identified.  2. Normal left ventricular wall motion.  3. Left ventricular ejection fraction 62%  4. Low-risk stress test findings*.  *2012 Appropriate Use Criteria for Coronary Revascularization Focused Update: J Am Coll Cardiol. 20121191;47(8):295-621tp://content.onliairportbarriers.comx?[email or web address]   Electronically Signed   By: JaimCorrie Mckusick.   On: 09/25/2014 14:24   Nm Pet Image Restag (ps) Skull Base To Thigh  09/18/2014   CLINICAL DATA:  Subsequent treatment strategy for breast carcinoma.  EXAM: NUCLEAR MEDICINE PET SKULL BASE TO THIGH  TECHNIQUE: 5.0 mCi F-18 FDG was injected intravenously. Full-ring PET imaging was performed from the skull base to thigh after the radiotracer. CT data was obtained and used for attenuation correction and anatomic localization.  FASTING BLOOD GLUCOSE:  Value: 94 mg/dl  COMPARISON:  By 10/28/2013 pleura PET-CT  FINDINGS: NECK  The thyroid gland is massively enlarged with a large portion extending into the right hemi thorax. There is variable metabolic activity scattered throughout this enlarged gland which is to similar prior.  CHEST  There is a moderate-sized right  pleural effusion. No hypermetabolic mediastinal lymph nodes. No hypermetabolic axillary nodes. No hypermetabolic pulmonary nodules.  ABDOMEN/PELVIS  No abnormal hypermetabolic activity within the liver, pancreas, adrenal glands, or spleen. No hypermetabolic lymph nodes in the abdomen or pelvis.  SKELETON  No focal hypermetabolic activity to  suggest skeletal metastasis.  IMPRESSION: 1. No evidence of breast cancer recurrence or metastasis. 2. Moderate to large right pleural effusion. 3. Small free fluid in the pelvis. 4. Massive thyroid goiter extending into the right hemi thorax.   Electronically Signed   By: Suzy Bouchard M.D.   On: 09/18/2014 13:32   Dg Chest Port 1 View  09/28/2014   CLINICAL DATA:  Check chest tube placement  EXAM: PORTABLE CHEST - 1 VIEW  COMPARISON:  09/27/2014  FINDINGS: Cardiac shadow is mildly enlarged but stable. A PleurX catheter is again noted on the right in stable position. Small apical right pneumothorax is not again seen. Large right superior mediastinal mass (goiter) is noted consistent with prior exams. Small pleural effusions are noted bilaterally and stable. No new focal infiltrate is seen. No acute bony abnormality is noted.  IMPRESSION: Stable appearance when compare with the prior exam. Minimal apical pneumothorax remains.   Electronically Signed   By: Inez Catalina M.D.   On: 09/28/2014 07:35   Dg Chest Port 1 View  09/27/2014   CLINICAL DATA:  PleurX catheter insertion.  Right pleural effusion.  EXAM: PORTABLE CHEST - 1 VIEW 12:20 p.m.  COMPARISON:  Chest x-rays dated 09/27/2014 at 6:28 a.m., 09/24/2014 and 09/19/2014  FINDINGS: Right PleurX catheter has been inserted and the right pleural effusion has diminished. Tiny right apical pneumothorax.  Increased left pleural effusion. Heart size and vascularity are normal. Large spur right substernal goiter is unchanged.  IMPRESSION: PleurX catheter appears in good position. Significant decrease in right pleural effusion. Tiny right apical pneumothorax.  Increased small left effusion.   Electronically Signed   By: Lorriane Shire M.D.   On: 09/27/2014 12:40   Dg Chest Port 1 View  09/24/2014   CLINICAL DATA:  Shortness of breath.  Pleural effusion.  EXAM: PORTABLE CHEST - 1 VIEW  COMPARISON:  Multiple prior studies including 09/22/2014  FINDINGS: Right IJ  central line has been removed. Heart is enlarged. Bilateral pleural effusions again noted. There is right basilar opacity, stable in appearance and consistent with atelectasis or infiltrate. Left lower lung opacity obscures the hemidiaphragm and is consistent with atelectasis or infiltrate. Right mediastinal mass again noted and stable.  IMPRESSION: Persistent bilateral lower lobe opacities and pleural effusions.  Removal of right IJ central line.   Electronically Signed   By: Nolon Nations M.D.   On: 09/24/2014 08:48   Dg Chest Port 1 View  09/22/2014   CLINICAL DATA:  Right pleural effusion and shortness of breath. Status post right thoracentesis on 09/19/2014. History of thyroid goiter and breast carcinoma.  EXAM: PORTABLE CHEST - 1 VIEW  COMPARISON:  09/21/2014 and CT of the chest on 09/19/2014.  FINDINGS: Since thoracentesis, there is slight reaccumulation of right pleural fluid with a relatively small effusion identified by chest x-ray. There likely is a small left pleural effusion. Right lower lung atelectasis versus infiltrate present. Stable appearance of jugular central line and large right-sided mediastinal thyroid goiter.  IMPRESSION: Slight reaccumulation of right pleural fluid with relatively small effusion present. There likely is a small left pleural effusion. Right lower lung atelectasis versus infiltrate.   Electronically Signed   By: Jenness Corner.D.  On: 09/22/2014 10:41   Dg Chest Port 1 View  09/21/2014   CLINICAL DATA:  Dyspnea. Pleural effusions. Large substernal goiter.  EXAM: PORTABLE CHEST - 1 VIEW  COMPARISON:  09/20/2014  FINDINGS: Central venous catheter is unchanged with the tip in the superior vena cava above the cavoatrial junction. Again noted is the large right-sided substernal goiter extending into the right hemi thorax. There is new  There is new slight interstitial pulmonary edema. Bilateral pleural effusions have slightly increased. Cardiomegaly is unchanged.  Pulmonary vascularity appears slightly less prominent.  IMPRESSION: New interstitial edema.  Increasing effusions.   Electronically Signed   By: Lorriane Shire M.D.   On: 09/21/2014 07:03   Dg Chest Port 1 View  09/21/2014   CLINICAL DATA:  Central line placement  EXAM: PORTABLE CHEST - 1 VIEW  COMPARISON:  09/20/2014  FINDINGS: Interval placement of a right central venous catheter. Tip is projected over the low SVC region. The course of the catheter is somewhat lateral than would be expected but this is likely due to vascular displacement by a large right peritracheal mass lesion. Small amount of subcutaneous emphysema in the right neck. Persistent cardiac enlargement with mild pulmonary vascular congestion. Bilateral pleural effusions with basilar atelectasis or infiltration bilaterally. Surgical clips in the axilla bilaterally. Calcification of the aorta. No pneumothorax.  IMPRESSION: Right central venous catheter appears to be in adequate position. Persistent large right peritracheal mass lesion. Cardiac enlargement with pulmonary vascular congestion and bilateral pleural effusions with basilar infiltration or atelectasis.   Electronically Signed   By: Lucienne Capers M.D.   On: 09/21/2014 00:30   Dg Chest Port 1 View  09/20/2014   CLINICAL DATA:  81 year old female status post right thoracentesis  EXAM: PORTABLE CHEST - 1 VIEW  COMPARISON:  Prior chest x-ray 09/19/2014  FINDINGS: Stable cardiomegaly. Atherosclerotic calcification again noted in the transverse aorta. Similar appearance of large right paratracheal mass. There may be slight interval reaccumulation of the right-sided pleural effusion. New patchy opacity in the right lung base. Inspiratory volumes are slightly lower. The left lung remains relatively clear. Surgical changes suggest prior bilateral mastectomies. No acute osseous abnormality.  IMPRESSION: 1. Interval development of patchy airspace opacity in the right lung base which may  represent atelectasis, infiltrate or aspiration in the appropriate clinical setting. 2. Perhaps trace interval reaccumulation of right pleural fluid. 3. Stable right paratracheal mass.   Electronically Signed   By: Jacqulynn Cadet M.D.   On: 09/20/2014 14:45   Dg Swallowing Func-speech Pathology  09/28/2014    Objective Swallowing Evaluation:    Patient Details  Name: RENEKA NEBERGALL MRN: [id number] Date of Birth: December 16, 1932  Today's Date: 09/28/2014 Time: SLP Start Time (ACUTE ONLY): 0915-SLP Stop Time (ACUTE ONLY): 2202 SLP Time Calculation (min) (ACUTE ONLY): 12 min  Past Medical History:  Past Medical History  Diagnosis Date  . Hypercholesterolemia     takes Crestor daily  . Thyroid disease     had iodine radiation  . Hypertension     takes Hyzaar daily  . Dysrhythmia     takes Carvedilol daily  . Pneumonia     history of;last time about 4-57yr ago  . GERD (gastroesophageal reflux disease)     takes Protonix daily  . Gastric ulcer   . History of blood transfusion   . History of colon polyps   . Anemia     takes iron pill daily  . Diabetes mellitus without complication  takes Tradjenta daily  . History of radiation therapy 07/12/12-08/26/12    right breast/  . Paroxysmal atrial fibrillation     PCP EKG 09/27/2013: A. Fibrillation.. EKG 09/29/2013: S. Tach.  . Breast cancer 04/12/12    right-pos lymph node/left-DCIS  . Shortness of breath on exertion   . Bruit of left carotid artery   . Acute upper GI bleeding 01/24/2012  . MGUS (monoclonal gammopathy of unknown significance) 04/21/2013  . Osteoporosis 11/29/2013  . Recurrent right pleural effusion 07/26/2014  . Stage III chronic kidney disease 07/26/2014  . Goiter 07/27/2014  . Substernal goiter 12/23/2012  . Pleural effusion, right 12/23/2012  . Type 2 diabetes mellitus with renal manifestations 07/26/2014  . Dysphonia 09/20/2014  . Vocal cord paralysis     Suspected due to massive substernal goiter   Past Surgical History:  Past Surgical History  Procedure Laterality  Date  . Carpal tunnel release      left  . Breast biopsy    . Esophagogastroduodenoscopy  01/24/2012    Procedure: ESOPHAGOGASTRODUODENOSCOPY (EGD);  Surgeon: Jeryl Columbia, MD;   Location: [facility];  Service: Endoscopy;  Laterality: N/A;  . Thyroid goiter removed    . Cyst removed from back of neck    . Knee surgery      right with rods  . Esophagogastroduodenoscopy    . Colonoscopy    . Total mastectomy Bilateral 05/10/2012    Procedure: RIGHT modified mastectomy; LEFT total mastectomy;  Surgeon:  [name], MD;  Location: [facility];  Service: General;  Laterality:  Bilateral;  . Axillary sentinel node biopsy Right 05/10/2012    Procedure: AXILLARY SENTINEL lymph  NODE BIOPSY;  Surgeon: [name], MD;  Location: [facility];  Service: General;  Laterality: Right;   nuclear medicine injection right side  7:00 am   . Abdominal hysterectomy      fibroids, with bilateral SO   HPI:  Other Pertinent Information: 81 yo female with hx of breast cancer, right  nodule goiter, HLD, afib on chronic anticoagulation, HTN, DM2 on tradjenta  and hx of recurrent R sided pleural effusions who presented to the ED with  subjectively worsening sob over the past several days per MD note. Pt is  s/p thoracentesis 6/16 yielding 800cc of fluid and thoracentesis during  this admission.  Chest imaging demonstrated moderate R sided effusion. Pt  remained without hypoxia or tachypnea. Swallow evaluation ordered.  Pt  reports dysphagia with food/drink over the last few months.  Has some  chronic dysphagia due to goiter and voice changes after goiter surgery  approximately 40 years ago.  Pt admits to weight loss but denies dysphagia  being source. Pt reports frequently choking on bulky items as well as with  liquids.   Pt has never had swallow evaluation completed.  CXR showed  development of right lobe infiltrate, ? aspiraton.    No Data Recorded  Assessment / Plan / Recommendation CHL IP CLINICAL IMPRESSIONS 09/28/2014  Therapy  Diagnosis (None)  Clinical Impression Patient with mild oral and moderate pharyngeal motor  based dysphagia, characterized by decreased base of tongue strength,  delayed swallow initiation, hyloaryngeal excursion and boney prominences  from her cervical spine that appear to decrease pharyngeal space.  These  impairments result in penetration and aspiration across consistencies as  well as moderate pharyngeal residue.  Chin tuck posture with small boluses  and use of hard swallows effectively protected airway and prevented  pharyngeal residue.  Recommend  initiation of a Dys.2 texture diet with  thin liquids and strict use of aspiration precautions.  SLP will to  continue to follow.        CHL IP TREATMENT RECOMMENDATION 09/28/2014  Treatment Recommendations Therapy as outlined in treatment plan below     CHL IP DIET RECOMMENDATION 09/28/2014  SLP Diet Recommendations Dysphagia 2 (Fine chop);Thin  Liquid Administration via (None)  Medication Administration Whole meds with puree  Compensations Slow rate;Small sips/bites;Follow solids with liquid;Chin  tuck;Effortful swallow  Postural Changes and/or Swallow Maneuvers (None)     CHL IP OTHER RECOMMENDATIONS 09/28/2014  Recommended Consults (None)  Oral Care Recommendations Oral care BID  Other Recommendations (None)     CHL IP FOLLOW UP RECOMMENDATIONS 09/26/2014  Follow up Recommendations 24 hour supervision/assistance;Home health SLP     CHL IP FREQUENCY AND DURATION 09/28/2014  Speech Therapy Frequency (ACUTE ONLY) min 2x/week  Treatment Duration 1 week     Pertinent Vitals/Pain None    SLP Swallow Goals No flowsheet data found.  No flowsheet data found.    CHL IP REASON FOR REFERRAL 09/28/2014  Reason for Referral Objectively evaluate swallowing function     CHL IP ORAL PHASE 09/28/2014  Lips (None)  Tongue (None)  Mucous membranes (None)  Nutritional status (None)  Other (None)  Oxygen therapy (None)  Oral Phase WFL  Oral - Pudding Teaspoon (None)  Oral - Pudding Cup  (None)  Oral - Honey Teaspoon (None)  Oral - Honey Cup (None)  Oral - Honey Syringe (None)  Oral - Nectar Teaspoon (None)  Oral - Nectar Cup (None)  Oral - Nectar Straw (None)  Oral - Nectar Syringe (None)  Oral - Ice Chips (None)  Oral - Thin Teaspoon (None)  Oral - Thin Cup (None)  Oral - Thin Straw (None)  Oral - Thin Syringe (None)  Oral - Puree (None)  Oral - Mechanical Soft (None)  Oral - Regular (None)  Oral - Multi-consistency (None)  Oral - Pill (None)  Oral Phase - Comment (None)      CHL IP PHARYNGEAL PHASE 09/28/2014  Pharyngeal Phase Impaired  Pharyngeal - Pudding Teaspoon (None)  Penetration/Aspiration details (pudding teaspoon) (None)  Pharyngeal - Pudding Cup (None)  Penetration/Aspiration details (pudding cup) (None)  Pharyngeal - Honey Teaspoon (None)  Penetration/Aspiration details (honey teaspoon) (None)  Pharyngeal - Honey Cup (None)  Penetration/Aspiration details (honey cup) (None)  Pharyngeal - Honey Syringe (None)  Penetration/Aspiration details (honey syringe) (None)  Pharyngeal - Nectar Teaspoon (None)  Penetration/Aspiration details (nectar teaspoon) (None)  Pharyngeal - Nectar Cup (None)  Penetration/Aspiration details (nectar cup) (None)  Pharyngeal - Nectar Straw (None)  Penetration/Aspiration details (nectar straw) (None)  Pharyngeal - Nectar Syringe (None)  Penetration/Aspiration details (nectar syringe) (None)  Pharyngeal - Ice Chips (None)  Penetration/Aspiration details (ice chips) (None)  Pharyngeal - Thin Teaspoon (None)  Penetration/Aspiration details (thin teaspoon) (None)  Pharyngeal - Thin Cup (None)  Penetration/Aspiration details (thin cup) (None)  Pharyngeal - Thin Straw (None)  Penetration/Aspiration details (thin straw) (None)  Pharyngeal - Thin Syringe (None)  Penetration/Aspiration details (thin syringe') (None)  Pharyngeal - Puree (None)  Penetration/Aspiration details (puree) (None)  Pharyngeal - Mechanical Soft (None)  Penetration/Aspiration details (mechanical  soft) (None)  Pharyngeal - Regular (None)  Penetration/Aspiration details (regular) (None)  Pharyngeal - Multi-consistency (None)  Penetration/Aspiration details (multi-consistency) (None)  Pharyngeal - Pill (None)  Penetration/Aspiration details (pill) (None)  Pharyngeal Comment (None)      CHL IP CERVICAL ESOPHAGEAL PHASE 09/28/2014  Cervical Esophageal Phase WFL  Pudding Teaspoon (None)  Pudding Cup (None)  Honey Teaspoon (None)  Honey Cup (None)  Honey Straw (None)  Nectar Teaspoon (None)  Nectar Cup (None)  Nectar Straw (None)  Nectar Sippy Cup (None)  Thin Teaspoon (None)  Thin Cup (None)  Thin Straw (None)  Thin Sippy Cup (None)  Cervical Esophageal Comment (None)    CHL IP GO 09/20/2014  Functional Assessment Tool Used clinical judgement  Functional Limitations Swallowing  Swallow Current Status (Y1017) CM  Swallow Goal Status (P1025) CL  Swallow Discharge Status (E5277) (None)  Motor Speech Current Status (O2423) (None)  Motor Speech Goal Status (N3614) (None)  Motor Speech Goal Status (E3154) (None)  Spoken Language Comprehension Current Status (M0867) (None)  Spoken Language Comprehension Goal Status (Y1950) (None)  Spoken Language Comprehension Discharge Status (D3267) (None)  Spoken Language Expression Current Status (T2458) (None)  Spoken Language Expression Goal Status (K9983) (None)  Spoken Language Expression Discharge Status [phone] (None)  Attention Current Status (N3976) (None)  Attention Goal Status (B3419) (None)  Attention Discharge Status (F7902) (None)  Memory Current Status (I0973) (None)  Memory Goal Status (Z3299) (None)  Memory Discharge Status (M4268) (None)  Voice Current Status (T4196) (None)  Voice Goal Status (Q2297) (None)  Voice Discharge Status (L8921) (None)  Other Speech-Language Pathology Functional Limitation [phone]) (None)  Other Speech-Language Pathology Functional Limitation Goal Status (E0814)  (None)  Other Speech-Language Pathology Functional Limitation Discharge Status   [phone]) (None)    Gunnar Fusi, M.A., CCC-SLP [phone]        [place] 09/28/2014, 11:11 AM    Dg C-arm 1-60 Min-no Report  09/27/2014   CLINICAL DATA: surgery   C-ARM 1-60 MINUTES  Fluoroscopy was utilized by the requesting physician.  No radiographic  interpretation.    US Thoracentesis Asp Pleural Space W/img Guide  09/19/2014   INDICATION: Breast cancer, dyspnea, recurrent right pleural effusion. Request is made for diagnostic and therapeutic right thoracentesis.  EXAM: ULTRASOUND GUIDED DIAGNOSTIC AND THERAPEUTIC RIGHT THORACENTESIS  COMPARISON:  Prior thoracentesis on 08/24/2014  MEDICATIONS: None  COMPLICATIONS: None immediate  TECHNIQUE: Informed written consent was obtained from the patient after a discussion of the risks, benefits and alternatives to treatment. A timeout was performed prior to the initiation of the procedure.  Initial ultrasound scanning demonstrates a moderate to large right pleural effusion. The lower chest was prepped and draped in the usual sterile fashion. 1% lidocaine was used for local anesthesia.  An ultrasound image was saved for documentation purposes. A 6 Fr Safe-T-Centesis catheter was introduced. The thoracentesis was performed. The catheter was removed and a dressing was applied. The patient tolerated the procedure well without immediate post procedural complication. The patient was escorted to have an upright chest radiograph.  FINDINGS: A total of approximately 1.2 liters of hazy, amber fluid was removed. Requested samples were sent to the laboratory.  IMPRESSION: Successful ultrasound-guided diagnostic and therapeutic right sided thoracentesis yielding 1.2 liters of pleural fluid.  Read by: Rowe Robert, PA-C   Electronically Signed   By: Markus Daft M.D.   On: 09/19/2014 16:31         Subjective: Patient still has some dyspnea on exertion but is breathing better overall. Denies any fevers, chills, chest pain, nausea, vomiting, diarrhea, abdominal  pain.  Objective: Filed Vitals:   09/30/14 1954 10/01/14 0400 10/01/14 0608 10/01/14 1339  BP: 93/46  111/50 108/48  Pulse: 77  76 66  Temp: 98.6 F (37 C)  98.4 F (36.9  C) 98 F (36.7 C)  TempSrc: Oral  Oral Oral  Resp: '17  15 16  '$ Height:      Weight:  56.8 kg (125 lb 3.5 oz)    SpO2: 97%  97% 96%    Intake/Output Summary (Last 24 hours) at 10/01/14 1751 Last data filed at 10/01/14 1300  Gross per 24 hour  Intake    240 ml  Output    950 ml  Net   -710 ml   Weight change: -1.5 kg (-3 lb 4.9 oz) Exam:   General:  Pt is alert, follows commands appropriately, not in acute distress  HEENT: No icterus, No thrush, No neck mass, [place]/AT  Cardiovascular: RRR, S1/S2, no rubs, no gallops  Respiratory: Bibasilar crackles, right greater than left  Abdomen: Soft/+BS, non tender, non distended, no guarding  Extremities: 1+LE  edema, No lymphangitis, No petechiae, No rashes, no synovitis; +RUE edema  Data Reviewed: Basic Metabolic Panel:  Recent Labs Lab 09/25/14 0400  09/26/14 2219 09/27/14 0349 09/28/14 0238 09/29/14 0340 10/01/14 0335  NA 142  < > 139 140 141 141 136  K 3.7  < > 3.7 3.8 4.0 3.6 3.8  CL 115*  < > 108 108 109 107 102  CO2 20*  < > '24 23 24 25 28  '$ GLUCOSE 129*  < > 120* 112* 130* 108* 111*  BUN 29*  < > 22* 22* 22* 21* 21*  CREATININE 1.52*  < > 1.50* 1.49* 1.50* 1.59* 1.54*  CALCIUM 8.8*  < > 9.0 8.9 9.1 9.1 9.2  MG 1.7  --   --   --   --   --   --   < > = values in this interval not displayed. Liver Function Tests:  Recent Labs Lab 09/26/14 2219 09/29/14 0340  AST 21 16  ALT 16 12*  ALKPHOS 74 54  BILITOT 0.7 0.5  PROT 6.0* 5.7*  ALBUMIN 3.0* 2.9*   No results for input(s): LIPASE, AMYLASE in the last 168 hours. No results for input(s): AMMONIA in the last 168 hours. CBC:  Recent Labs Lab 09/27/14 0349 09/28/14 1021 09/29/14 0340 09/30/14 0338 10/01/14 0335  WBC 5.3 5.3 4.5 5.1 4.7  HGB 8.6* 9.5* 8.3* 8.3* 8.5*  HCT 26.7*  29.1* 25.7* 25.3* 26.2*  MCV 87.0 87.7 86.5 88.2 86.5  PLT 275 290 214 277 306   Cardiac Enzymes: No results for input(s): CKTOTAL, CKMB, CKMBINDEX, TROPONINI in the last 168 hours. BNP: Invalid input(s): POCBNP CBG:  Recent Labs Lab 09/30/14 1626 09/30/14 2121 10/01/14 0605 10/01/14 1123 10/01/14 1631  GLUCAP 83 108* 87 102* 108*    Recent Results (from the past 240 hour(s))  Body fluid culture     Status: None   Collection Time: 09/27/14 11:44 AM  Result Value Ref Range Status   Specimen Description FLUID RIGHT PLEURAL  Final   Special Requests PART B  Final   Gram Stain   Final    WBC PRESENT,BOTH PMN AND MONONUCLEAR NO ORGANISMS SEEN CYTOSPIN SMEAR    Culture NO GROWTH 3 DAYS  Final   Report Status 09/30/2014 FINAL  Final     Scheduled Meds: . amiodarone  200 mg Oral BID  . anastrozole  1 mg Oral Q breakfast  . atorvastatin  10 mg Oral q1800  . feeding supplement  1 Container Oral TID BM  . feeding supplement (ENSURE ENLIVE)  237 mL Oral BID BM  . ferrous sulfate  325 mg Oral TID  WC  . linagliptin  5 mg Oral Daily  . metoprolol tartrate  12.5 mg Oral QID  . pantoprazole  40 mg Oral Daily  . polyethylene glycol  17 g Oral Daily  . warfarin  5 mg Oral ONCE-1800  . warfarin   Does not apply Once  . Warfarin - Pharmacist Dosing Inpatient   Does not apply q1800   Continuous Infusions: . heparin 1,150 Units/hr (09/30/14 0801)     Dexton Zwilling, DO  [facility] Pager [phone]  If 7PM-7AM, please contact night-coverage [email or web address] Password [email or web address] 10/01/2014, 5:51 PM   LOS: 10 days

## 2014-10-02 LAB — CBC
HEMATOCRIT: 25.6 % — AB (ref 36.0–46.0)
Hemoglobin: 8.4 g/dL — ABNORMAL LOW (ref 12.0–15.0)
MCH: 28.7 pg (ref 26.0–34.0)
MCHC: 32.8 g/dL (ref 30.0–36.0)
MCV: 87.4 fL (ref 78.0–100.0)
Platelets: 250 10*3/uL (ref 150–400)
RBC: 2.93 MIL/uL — AB (ref 3.87–5.11)
RDW: 16.6 % — AB (ref 11.5–15.5)
WBC: 5.4 10*3/uL (ref 4.0–10.5)

## 2014-10-02 LAB — PROTIME-INR
INR: 1.77 — ABNORMAL HIGH (ref 0.00–1.49)
Prothrombin Time: 20.6 seconds — ABNORMAL HIGH (ref 11.6–15.2)

## 2014-10-02 LAB — HEPARIN LEVEL (UNFRACTIONATED): Heparin Unfractionated: 0.44 IU/mL (ref 0.30–0.70)

## 2014-10-02 MED ORDER — WARFARIN SODIUM 5 MG PO TABS
5.0000 mg | ORAL_TABLET | ORAL | Status: AC
  Administered 2014-10-02: 5 mg via ORAL
  Filled 2014-10-02: qty 1

## 2014-10-02 NOTE — Progress Notes (Signed)
PROGRESS NOTE  Alison Gross XBL:[id number] DOB: August 16, 1932 DOA: 09/19/2014 PCP: Maximino Greenland, MD  Brief Summary  Alison Gross is a 81 y.o. female with a hx of goiter, HLD, afib on chronic anticoagulation, chronic CHF, HTN, DM2 on tradjenta, breast cancer in remission, MGUS, and hx of recurrent R sided pleural effusions who presents to the [place] ED on 7/12 with worsening sob over the past several days. Pt is s/p thoracentesis 6/16 yielding 800cc. In ED, chest imaging demonstrated moderate R sided effusion. Of note, patient does have a known goiter. Family reported pt had been having more difficulty swallowing foods recently. Essentially, she has had a chronic goiter, part of which was removed in the 1970s (external portion). It is large, intrathoracic and was probably the cause of her chronic right effusion which she tolerated for many years. She has had some marginal enlargement in the last few months which has led to dyspnea requiring thoracentesis, dysphonia, and dysphagia. Ultimately, she needs treatment for her goiter or she will need palliative measures such as pleurex. Options being explored are surgical removal, radiation. As per TCTS, high risk for major surgery and hence plans for right Pleurx catheter placement on 7/20.  STUDIES:  6/16 Rt pleural fluid >> 800 ml, protein 3.2, LDH 49, WBC 614 (84%L), atypical cells present on cytology but were finally reported as reactive mesothelial cells 7/11 PET scan >> moderate/large Rt pleural effusion, massive thyroid goiter 7/12 ADMIT 7/12 CT chest >> third spacing of fluid, Large Rt pleural effusion, very large thyroid, centrilobular emphysema 7/12 Rt thoracentesis with removal of 1200 mL 7/13 developed hypotension and transferred to ICU requiring IVF and vasopressors, Speech assessment >> stage 1 baby food 7/14 weaned off vasopressors 7/14 CT head/neck >> massive Rt thyroid, mod/large Rt effusion 7/14 Echo >>  severe LVH, EF 55 to 30%, grade 2 diastolic dysfx, severe LA dilation, mod RA dilation, small/mod pericardial effusion 7/15: Right IJ TLC removed 7/16: DVT in the RUE, transfer to [facility] for cardiothoracic surgery consultation in hospital, tapering hydrocortisone 7/20: R-Pleurx placed 7/22: start warfarin  Assessment/Plan  Goiter, large and compressing the right lung and likely contributing to her effusion, swallowing and speech difficulties. See above. TSH 1.115 on 6/15 and fT3 and fT4 wnl - Status post remote partial thyroidectomy and RAI in 2015. Reviewed old records and patient had seen Dr. Chalmers Cater, endocrinology in March 2013 - Patient was transferred from [facility] to [facility] on 7/16 for thoracic surgery opinion regarding goiter surgery. - As per Dr. Virgie Dad discussion with radiation oncology-not appropriate for radiation treatment - 2 TCTS surgeons have evaluated patient and determined that she is high risk candidate for major surgery i.e. thyroidectomy.  -right Pleurx catheter placement 7/20 for recurrent symptomatic pleural effusion.  Recurrent right pleural effusion  - etiology unclear -likely secondary to her goiter vs CHF - transudate by [employer] Criteria but had WBC 501 - Status post thoracentesis by interventional radiology on 7/12 - Cytology from 6/16 and 7/12 both with reactive mesothelial cells - Pleural fluid Cx NG - Pleurx 7/20. -Dr. Algis Liming discussed with Dr. [name] on 7/19 and he recommended repeating Echo to reassess Pericardial fluid-->small amount of pericardial fluid without features of tamponade -2.8L drainage from Pleurx since placement on 09/27/14 -change drainage schedule to q 48hrs, next drainage of Pleurx on 7/27  Acute on Chronic diastolic heart failure - Significantly volume overloaded. Started IV Lasix 7/17-dosed daily based on BMP -  daily weight - lasix (started 7/17)--has received 4 doses -Neg 10 pounds since admission -further  dosing per cardiology-->2 po doses lasix 7/21-7/22 -7/23--d/c foley  Paroxysmal atrial fibrillation,  - Patient had A. fib with RVR overnight 7/16 and was started on Cardizem drip and titrate up to 10 mg/h. She reverted to sinus rhythm. Remains in sinus rhythm. Cardiology has changed to oral Cardizem 7/16. -d/c diltiazem as pt has pt only received one dose in past 48 hours due to soft BPs -decreased metoprolol dosing to 12.5 mg q6 due to soft BP -Due to the patient's EKG stage IV, unable to use factor Xa inhibitors. We'll need to continue heparin bridge with initiation of warfarin. 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 The case was discussed by Dr. Sheran Fava with Dr. Wilburn Cornelia from ENT who will follow up as outpatient for evaluation - Speech therapy  - follow-up with ENT in clinic   Dysphagia, likely secondary to large goiter. Got choked up on dysphagia 2 diet. - 7/21 MBS-->recommended dys 2 diet with thin liquid per speech  Hypotension, was likely secondary to intravascular volume depletion/dehydration and relative adrenal insufficiency. She was given IVF and started on stress dose steroids. - Off stress dose steroids since 7/17 - resolved. Now intermittent soft blood pressures may be related to Cardizem and furosemide.  Acute DVT in RUE secondary to central line - Central line removed on 7/15 - 7/22-start warfarin with heparin bridge  Elevated troponin, chest pain-free,  -EKG demonstrates no acute ST segment elevation or T-wave inversions. She did previously have some T-wave inversions in the lateral leads and some mild ST segment elevations in the anterior leads which are stable.  -Stable elevation due to CKD and effusion. - Cardiology evaluated and performed a stress test on 7/18: Negative  Diabetes mellitus type 2 with well  controlled blood sugars,  - A1c 6.2 On 07/27/14 -CBGs well controlled-->d/c SSI and CBG checks - continue Tradjenta  Right-sided breast cancer in remission,  -no evidence of recurrence on recent PET scan--09/18/14 - Continue arimidex - Dr. Virgie Dad courtesy f/u appreciated.  Hyperlipidemia, stable, continue atorvastatin  CKD stage III-IV,  -Creatinine clearance less than 30  - Minimize nephrotoxicity and renally dose medications - Creatinine gradually improving over the last 5 days. - baseline creatinine 1.3-1.6  Hypercalcemia secondary to dehydration and possible thyroid abnormalities (nonparathyroid hypercalcemia), resolved.  - Vitamin D 54, PTHrp wnl, PTH < 10  Iron deficiency anemia superimposed on MGUS. - continue iron supplementation - B12, folate, and TSH wnl - Hemoglobin stable.  Moderate malnutrition - Nutrition  - supplements  Constipation  - Continue miralax - Bisacodyl suppository - Lactulose 30gm once  NSVT 1 on 7/17 - Continue monitoring on telemetry. Try to keep K >4 & magnesium >2.     Procedures/Studies: Dg Chest 1 View  09/19/2014   CLINICAL DATA:  Status post right thoracentesis  EXAM: CHEST  1 VIEW  COMPARISON:  09/19/2014  FINDINGS: Negative for pneumothorax following right thoracentesis. Trace right effusion remains. Improved right middle and lower lobe aeration with some residual atelectasis. Large mediastinal mass along the right peritracheal region compatible with a known massive thyroid goiter. Left lung remains clear. Stable cardiomegaly and vascularity. No superimposed edema.  IMPRESSION: Negative for pneumothorax following right thoracentesis. Decrease in the right effusion. Otherwise stable exam.   Electronically Signed   By: Jerilynn Mages.  Shick M.D.   On: 09/19/2014 16:44   Dg Chest 2 View  09/27/2014   CLINICAL DATA:  Shortness of breath and cough  EXAM: CHEST  2 VIEW  COMPARISON:  Chest radiograph September 26, 2014; chest CT September 19, 2014  FINDINGS: Pleural effusions bilaterally are stable. There is patchy consolidation in the bases, also stable. No new parenchymal lung opacity identified. The large right-sided thyroid goiter remains with deviation of the lower thoracic trachea toward the left. The heart size is within normal limits. Pulmonary vascularity is within normal limits. There are surgical clips in both axillary regions as well as in the right peritracheal region, stable. There is stable degenerative change in the thoracic spine.  IMPRESSION: No no appreciable change from 1 day prior. Bilateral effusions with bibasilar patchy airspace consolidation remain. This consolidation is felt to be largely due to atelectasis, although superimposed pneumonia must be of concern. No change in cardiac silhouette. Large right lobe thyroid goiter remains, stable. Tracheal deviation is likewise unchanged.   Electronically Signed   By: [name] III M.D.   On: 09/27/2014 07:35   Dg Chest 2 View  09/26/2014   CLINICAL DATA:  81 year old female with pleural effusion  EXAM: CHEST  2 VIEW  COMPARISON:  Chest radiograph dated 09/24/2014 and CT dated 09/19/2014  FINDINGS: Two views of the chest demonstrate emphysematous changes. There are moderate right and small left pleural effusion. There is associated partial compressive atelectasis of the right lower lobe. Superimposed pneumonia is not excluded. There is no pneumothorax. Large right thyroid mass again noted with mass effect and mild deviation of trachea to the left. There is degenerative changes of the spine.  IMPRESSION: No significant interval change in size of the bilateral pleural effusions.  Large right thyroid nodule.   Electronically Signed   By: Anner Crete M.D.   On: 09/26/2014 21:54   Dg Chest 2 View  09/19/2014   CLINICAL DATA:  Increasing shortness of breath. Wheezing. Onset of symptoms last night.  EXAM: CHEST  2 VIEW  COMPARISON:  CT 09/18/2014.  FINDINGS: RIGHT  paratracheal mass compatible with substernal goiter. Moderate RIGHT pleural effusion with compressive atelectasis. These findings appear similar to the CT yesterday. Aortic arch atherosclerosis. Axillary surgical clips bilaterally. Monitoring leads project over the chest. Cardiopericardial silhouette appears unchanged compared to prior chest radiographs. Lung volumes are lower than on previous study 08/24/2014.  IMPRESSION: 1. Moderate RIGHT pleural effusion with compressive/relaxation atelectasis of the RIGHT lower lobe and RIGHT middle lobe. 2. Large RIGHT peritracheal mass compatible with substernal goiter evaluated on prior CT. 3. Lower lung volumes than on prior chest radiographs.   Electronically Signed   By: Dereck Ligas M.D.   On: 09/19/2014 08:11   Ct Soft Tissue Neck Wo Contrast  09/21/2014   CLINICAL DATA:  81 year old female with difficulty swallowing. Initial encounter. Right pleural effusion treated with thoracentesis 2 days ago. History of thyroid goiter. History of treated breast cancer in 2014.  EXAM: CT NECK WITHOUT CONTRAST  TECHNIQUE: Multidetector CT imaging of the neck was performed following the standard protocol without intravenous contrast.  COMPARISON:  PET-CT 09/18/2014, 10/28/2013.  Chest CT 10/28/2013.  FINDINGS: Chronic massive right thyroid lobe enlargement, with the vast majority of the enlarged right lobe occupying the mediastinum and right hemithorax.  Mediastinal involvement by right thyroid goiter seen as far back as 2004 (with about 7 cm of right thyroid lobe in the mediastinum at that time).  Currently the right lobe  extends as far caudally as the right mainstem bronchus and encompasses approximately 8.0 by 9.8 x 15.0 cm (AP by transverse by CC), with an estimated volume of 588 mL. Compare that to a normal splenic volume of no larger than 412 mL.  Chronic heterogeneous density throughout the enlarged lobe. The left thyroid lobe is also chronically but more modestly  enlarged encompassing about 4.3 x 3.1 x 6.4 cm (AP by transverse by CC). Despite the marked thyroid enlargement there is no significant mass effect on the trachea, carina, or right mainstem.  Asymmetry of the larynx suggests right vocal cord paralysis. No laryngeal or pharyngeal soft tissue mass is evident. Negative parapharyngeal, retropharyngeal, and sublingual spaces. Negative non contrast submandibular and parotid glands. No cervical lymphadenopathy identified in the absence of contrast. Negative visualized noncontrast brain parenchyma. Postoperative changes to the globes.  Chronic sphenoid sinus periosteal thickening, but Visualized paranasal sinuses and mastoids are clear. Absent dentition. No acute or suspicious osseous lesion identified. Small sclerotic focus in the right manubrium is stable since 2004.  Right IJ approach central line in place.  Layering moderate to large right pleural effusion and smaller left pleural effusion. Compressive atelectasis in both lungs greater on the right.  IMPRESSION: 1. Massive chronic enlargement of the right thyroid lobe, with the vast majority of the enlarged lobe occupying the mediastinum and right hemithorax as far down as the right mainstem bronchus. See details above. 2. Despite this, no significant mass effect on the airway or trachea. 3. Layering moderate to large right and smaller left pleural effusions with right greater than left pulmonary compressive atelectasis. 4. Suggestion of right vocal cord paralysis. No laryngeal/pharyngeal mass or cervical lymphadenopathy identified in the absence of contrast.   Electronically Signed   By: Genevie Ann M.D.   On: 09/21/2014 10:06   Ct Chest Wo Contrast  09/19/2014   CLINICAL DATA:  Right breast cancer. Increasing shortness of breath.  EXAM: CT CHEST WITHOUT CONTRAST  TECHNIQUE: Multidetector CT imaging of the chest was performed following the standard protocol without IV contrast.  COMPARISON:  Multiple exams, including  09/18/2014 and 09/19/2014  FINDINGS: Mediastinum/Nodes: Prominent thyroid goiter with massively enlarged right lobe extending into the right hemithorax and measuring approximately 13.9 by 8.0 by 7.1 cm.  Diffuse subcutaneous edema. Aortic arch and branch vessel atherosclerosis. Low-level stranding in the mediastinum with small to moderate pericardial effusion.  Lungs/Pleura: Right pleural effusion occupies greater than 50% of right hemithoracic volume and is associated with passive atelectasis.  Centrilobular emphysema. Old granulomatous disease. Trace left pleural effusion.  Upper abdomen: Stranding in the intra-abdominal adipose tissue.  Musculoskeletal: Thoracic spondylosis.  IMPRESSION: 1. Considerable third spacing of fluid with subcutaneous, mediastinal, and upper abdominal infiltrative edema. 2. Large right and trace left pleural effusions. 3. Very large thyroid goiter, with masslike extension of the right lobe into the right hemithorax as detailed on prior exams. 4. Centrilobular emphysema.   Electronically Signed   By: Van Clines M.D.   On: 09/19/2014 09:57   Nm Myocar Multi W/spect W/wall Motion / Ef  09/25/2014    Blood pressure demonstrated a hypertensive response to exercise.  Horizontal ST segment depression ST segment depression was noted during  stress in the II, III and aVF leads, beginning at 1 minutes of stress,  ending at 6 minutes of stress, and returning to baseline after 5-9 minutes  of recovery.   CLINICAL DATA:  81 year old female with a history of chest pain.  Cardiovascular risk factors include  diabetes, hyperlipidemia.  EXAM: MYOCARDIAL IMAGING WITH SPECT (REST AND PHARMACOLOGIC-STRESS)  GATED LEFT VENTRICULAR WALL MOTION STUDY  LEFT VENTRICULAR EJECTION FRACTION  TECHNIQUE: Standard myocardial SPECT imaging was performed after resting intravenous injection of 10 mCi Tc-70msestamibi. Subsequently, intravenous infusion of Lexiscan was performed under the supervision of the  Cardiology staff. At peak effect of the drug, 30 mCi Tc-983mestamibi was injected intravenously and standard myocardial SPECT imaging was performed. Quantitative gated imaging was also performed to evaluate left ventricular wall motion, and estimate left ventricular ejection fraction.  COMPARISON:  None.  FINDINGS: Perfusion: Small fixed defect at the inferior lateral wall on rest and perfusion images. No reversible ischemia identified.  Wall Motion: Normal left ventricular wall motion. No left ventricular dilation.  Left Ventricular Ejection Fraction: 62 %  End diastolic volume 71 ml  End systolic volume 27 ml  IMPRESSION: 1. Small fixed defect at the inferior lateral wall. No reversible ischemia identified.  2. Normal left ventricular wall motion.  3. Left ventricular ejection fraction 62%  4. Low-risk stress test findings*.  *2012 Appropriate Use Criteria for Coronary Revascularization Focused Update: J Am Coll Cardiol. 208841;66(0):630-160http://content.[email or web address]?[email or web address]   Electronically Signed   By: JaCorrie Mckusick.O.   On: 09/25/2014 14:24    09/25/2014   CLINICAL DATA:  [age]ear old female with a history of chest pain.  Cardiovascular risk factors include diabetes, hyperlipidemia.  EXAM: MYOCARDIAL IMAGING WITH SPECT (REST AND PHARMACOLOGIC-STRESS)  GATED LEFT VENTRICULAR WALL MOTION STUDY  LEFT VENTRICULAR EJECTION FRACTION  TECHNIQUE: Standard myocardial SPECT imaging was performed after resting intravenous injection of 10 mCi [id number]. Subsequently, intravenous infusion of Lexiscan was performed under the supervision of the Cardiology staff. At peak effect of the drug, 30 mCi Tc-69m73mtamibi was injected intravenously and standard myocardial SPECT imaging was performed. Quantitative gated imaging was also performed to evaluate left ventricular wall motion, and estimate left ventricular ejection fraction.  COMPARISON:  None.  FINDINGS: Perfusion: Small fixed defect at  the inferior lateral wall on rest and perfusion images. No reversible ischemia identified.  Wall Motion: Normal left ventricular wall motion. No left ventricular dilation.  Left Ventricular Ejection Fraction: 62 %  End diastolic volume 71 ml  End systolic volume 27 ml  IMPRESSION: 1. Small fixed defect at the inferior lateral wall. No reversible ischemia identified.  2. Normal left ventricular wall motion.  3. Left ventricular ejection fraction 62%  4. Low-risk stress test findings*.  *2012 Appropriate Use Criteria for Coronary Revascularization Focused Update: J Am Coll Cardiol. 20121093;23(5):573-220tp://content.onliairportbarriers.comx?[email or web address]   Electronically Signed   By: JaimCorrie Mckusick.   On: 09/25/2014 14:24   Nm Pet Image Restag (ps) Skull Base To Thigh  09/18/2014   CLINICAL DATA:  Subsequent treatment strategy for breast carcinoma.  EXAM: NUCLEAR MEDICINE PET SKULL BASE TO THIGH  TECHNIQUE: 5.0 mCi F-18 FDG was injected intravenously. Full-ring PET imaging was performed from the skull base to thigh after the radiotracer. CT data was obtained and used for attenuation correction and anatomic localization.  FASTING BLOOD GLUCOSE:  Value: 94 mg/dl  COMPARISON:  By 10/28/2013 pleura PET-CT  FINDINGS: NECK  The thyroid gland is massively enlarged with a large portion extending into the right hemi thorax. There is variable metabolic activity scattered throughout this enlarged gland which is to similar prior.  CHEST  There is a moderate-sized right pleural effusion. No hypermetabolic mediastinal lymph nodes. No hypermetabolic axillary nodes. No hypermetabolic pulmonary nodules.  ABDOMEN/PELVIS  No abnormal hypermetabolic activity  within the liver, pancreas, adrenal glands, or spleen. No hypermetabolic lymph nodes in the abdomen or pelvis.  SKELETON  No focal hypermetabolic activity to suggest skeletal metastasis.  IMPRESSION: 1. No evidence of breast cancer recurrence or metastasis. 2. Moderate  to large right pleural effusion. 3. Small free fluid in the pelvis. 4. Massive thyroid goiter extending into the right hemi thorax.   Electronically Signed   By: Suzy Bouchard M.D.   On: 09/18/2014 13:32   Dg Chest Port 1 View  09/28/2014   CLINICAL DATA:  Check chest tube placement  EXAM: PORTABLE CHEST - 1 VIEW  COMPARISON:  09/27/2014  FINDINGS: Cardiac shadow is mildly enlarged but stable. A PleurX catheter is again noted on the right in stable position. Small apical right pneumothorax is not again seen. Large right superior mediastinal mass (goiter) is noted consistent with prior exams. Small pleural effusions are noted bilaterally and stable. No new focal infiltrate is seen. No acute bony abnormality is noted.  IMPRESSION: Stable appearance when compare with the prior exam. Minimal apical pneumothorax remains.   Electronically Signed   By: Inez Catalina M.D.   On: 09/28/2014 07:35   Dg Chest Port 1 View  09/27/2014   CLINICAL DATA:  PleurX catheter insertion.  Right pleural effusion.  EXAM: PORTABLE CHEST - 1 VIEW 12:20 p.m.  COMPARISON:  Chest x-rays dated 09/27/2014 at 6:28 a.m., 09/24/2014 and 09/19/2014  FINDINGS: Right PleurX catheter has been inserted and the right pleural effusion has diminished. Tiny right apical pneumothorax.  Increased left pleural effusion. Heart size and vascularity are normal. Large spur right substernal goiter is unchanged.  IMPRESSION: PleurX catheter appears in good position. Significant decrease in right pleural effusion. Tiny right apical pneumothorax.  Increased small left effusion.   Electronically Signed   By: Lorriane Shire M.D.   On: 09/27/2014 12:40   Dg Chest Port 1 View  09/24/2014   CLINICAL DATA:  Shortness of breath.  Pleural effusion.  EXAM: PORTABLE CHEST - 1 VIEW  COMPARISON:  Multiple prior studies including 09/22/2014  FINDINGS: Right IJ central line has been removed. Heart is enlarged. Bilateral pleural effusions again noted. There is right basilar  opacity, stable in appearance and consistent with atelectasis or infiltrate. Left lower lung opacity obscures the hemidiaphragm and is consistent with atelectasis or infiltrate. Right mediastinal mass again noted and stable.  IMPRESSION: Persistent bilateral lower lobe opacities and pleural effusions.  Removal of right IJ central line.   Electronically Signed   By: Nolon Nations M.D.   On: 09/24/2014 08:48   Dg Chest Port 1 View  09/22/2014   CLINICAL DATA:  Right pleural effusion and shortness of breath. Status post right thoracentesis on 09/19/2014. History of thyroid goiter and breast carcinoma.  EXAM: PORTABLE CHEST - 1 VIEW  COMPARISON:  09/21/2014 and CT of the chest on 09/19/2014.  FINDINGS: Since thoracentesis, there is slight reaccumulation of right pleural fluid with a relatively small effusion identified by chest x-ray. There likely is a small left pleural effusion. Right lower lung atelectasis versus infiltrate present. Stable appearance of jugular central line and large right-sided mediastinal thyroid goiter.  IMPRESSION: Slight reaccumulation of right pleural fluid with relatively small effusion present. There likely is a small left pleural effusion. Right lower lung atelectasis versus infiltrate.   Electronically Signed   By: Aletta Edouard M.D.   On: 09/22/2014 10:41   Dg Chest Port 1 View  09/21/2014   CLINICAL DATA:  Dyspnea. Pleural effusions.  Large substernal goiter.  EXAM: PORTABLE CHEST - 1 VIEW  COMPARISON:  09/20/2014  FINDINGS: Central venous catheter is unchanged with the tip in the superior vena cava above the cavoatrial junction. Again noted is the large right-sided substernal goiter extending into the right hemi thorax. There is new  There is new slight interstitial pulmonary edema. Bilateral pleural effusions have slightly increased. Cardiomegaly is unchanged. Pulmonary vascularity appears slightly less prominent.  IMPRESSION: New interstitial edema.  Increasing effusions.    Electronically Signed   By: Lorriane Shire M.D.   On: 09/21/2014 07:03   Dg Chest Port 1 View  09/21/2014   CLINICAL DATA:  Central line placement  EXAM: PORTABLE CHEST - 1 VIEW  COMPARISON:  09/20/2014  FINDINGS: Interval placement of a right central venous catheter. Tip is projected over the low SVC region. The course of the catheter is somewhat lateral than would be expected but this is likely due to vascular displacement by a large right peritracheal mass lesion. Small amount of subcutaneous emphysema in the right neck. Persistent cardiac enlargement with mild pulmonary vascular congestion. Bilateral pleural effusions with basilar atelectasis or infiltration bilaterally. Surgical clips in the axilla bilaterally. Calcification of the aorta. No pneumothorax.  IMPRESSION: Right central venous catheter appears to be in adequate position. Persistent large right peritracheal mass lesion. Cardiac enlargement with pulmonary vascular congestion and bilateral pleural effusions with basilar infiltration or atelectasis.   Electronically Signed   By: Lucienne Capers M.D.   On: 09/21/2014 00:30   Dg Chest Port 1 View  09/20/2014   CLINICAL DATA:  81 year old female status post right thoracentesis  EXAM: PORTABLE CHEST - 1 VIEW  COMPARISON:  Prior chest x-ray 09/19/2014  FINDINGS: Stable cardiomegaly. Atherosclerotic calcification again noted in the transverse aorta. Similar appearance of large right paratracheal mass. There may be slight interval reaccumulation of the right-sided pleural effusion. New patchy opacity in the right lung base. Inspiratory volumes are slightly lower. The left lung remains relatively clear. Surgical changes suggest prior bilateral mastectomies. No acute osseous abnormality.  IMPRESSION: 1. Interval development of patchy airspace opacity in the right lung base which may represent atelectasis, infiltrate or aspiration in the appropriate clinical setting. 2. Perhaps trace interval  reaccumulation of right pleural fluid. 3. Stable right paratracheal mass.   Electronically Signed   By: Jacqulynn Cadet M.D.   On: 09/20/2014 14:45   Dg Swallowing Func-speech Pathology  09/28/2014    Objective Swallowing Evaluation:    Patient Details  Name: ARNEDA SAPPINGTON MRN: [id number] Date of Birth: [birth year]  Today's Date: 09/28/2014 Time: SLP Start Time (ACUTE ONLY): 0915-SLP Stop Time (ACUTE ONLY): 6283 SLP Time Calculation (min) (ACUTE ONLY): 12 min  Past Medical History:  Past Medical History  Diagnosis Date  . Hypercholesterolemia     takes Crestor daily  . Thyroid disease     had iodine radiation  . Hypertension     takes Hyzaar daily  . Dysrhythmia     takes Carvedilol daily  . Pneumonia     history of;last time about 4-12yr ago  . GERD (gastroesophageal reflux disease)     takes Protonix daily  . Gastric ulcer   . History of blood transfusion   . History of colon polyps   . Anemia     takes iron pill daily  . Diabetes mellitus without complication     takes Tradjenta daily  . History of radiation therapy 07/12/12-08/26/12    right breast/  . Paroxysmal atrial fibrillation  PCP EKG 09/27/2013: A. Fibrillation.. EKG 09/29/2013: S. Tach.  . Breast cancer 04/12/12    right-pos lymph node/left-DCIS  . Shortness of breath on exertion   . Bruit of left carotid artery   . Acute upper GI bleeding 01/24/2012  . MGUS (monoclonal gammopathy of unknown significance) 04/21/2013  . Osteoporosis 11/29/2013  . Recurrent right pleural effusion 07/26/2014  . Stage III chronic kidney disease 07/26/2014  . Goiter 07/27/2014  . Substernal goiter 12/23/2012  . Pleural effusion, right 12/23/2012  . Type 2 diabetes mellitus with renal manifestations 07/26/2014  . Dysphonia 09/20/2014  . Vocal cord paralysis     Suspected due to massive substernal goiter   Past Surgical History:  Past Surgical History  Procedure Laterality Date  . Carpal tunnel release      left  . Breast biopsy    . Esophagogastroduodenoscopy  01/24/2012     Procedure: ESOPHAGOGASTRODUODENOSCOPY (EGD);  Surgeon: Jeryl Columbia, MD;   Location: [facility];  Service: Endoscopy;  Laterality: N/A;  . Thyroid goiter removed    . Cyst removed from back of neck    . Knee surgery      right with rods  . Esophagogastroduodenoscopy    . Colonoscopy    . Total mastectomy Bilateral 05/10/2012    Procedure: RIGHT modified mastectomy; LEFT total mastectomy;  Surgeon:  [name], MD;  Location: [facility];  Service: General;  Laterality:  Bilateral;  . Axillary sentinel node biopsy Right 05/10/2012    Procedure: AXILLARY SENTINEL lymph  NODE BIOPSY;  Surgeon: [name], MD;  Location: [facility];  Service: General;  Laterality: Right;   nuclear medicine injection right side  7:00 am   . Abdominal hysterectomy      fibroids, with bilateral SO   HPI:  Other Pertinent Information: 81 yo female with hx of breast cancer, right  nodule goiter, HLD, afib on chronic anticoagulation, HTN, DM2 on tradjenta  and hx of recurrent R sided pleural effusions who presented to the ED with  subjectively worsening sob over the past several days per MD note. Pt is  s/p thoracentesis 6/16 yielding 800cc of fluid and thoracentesis during  this admission.  Chest imaging demonstrated moderate R sided effusion. Pt  remained without hypoxia or tachypnea. Swallow evaluation ordered.  Pt  reports dysphagia with food/drink over the last few months.  Has some  chronic dysphagia due to goiter and voice changes after goiter surgery  approximately 40 years ago.  Pt admits to weight loss but denies dysphagia  being source. Pt reports frequently choking on bulky items as well as with  liquids.   Pt has never had swallow evaluation completed.  CXR showed  development of right lobe infiltrate, ? aspiraton.    No Data Recorded  Assessment / Plan / Recommendation CHL IP CLINICAL IMPRESSIONS 09/28/2014  Therapy Diagnosis (None)  Clinical Impression Patient with mild oral and moderate pharyngeal motor  based dysphagia,  characterized by decreased base of tongue strength,  delayed swallow initiation, hyloaryngeal excursion and boney prominences  from her cervical spine that appear to decrease pharyngeal space.  These  impairments result in penetration and aspiration across consistencies as  well as moderate pharyngeal residue.  Chin tuck posture with small boluses  and use of hard swallows effectively protected airway and prevented  pharyngeal residue.  Recommend initiation of a Dys.2 texture diet with  thin liquids and strict use of aspiration precautions.  SLP will to  continue to follow.  CHL IP TREATMENT RECOMMENDATION 09/28/2014  Treatment Recommendations Therapy as outlined in treatment plan below     CHL IP DIET RECOMMENDATION 09/28/2014  SLP Diet Recommendations Dysphagia 2 (Fine chop);Thin  Liquid Administration via (None)  Medication Administration Whole meds with puree  Compensations Slow rate;Small sips/bites;Follow solids with liquid;Chin  tuck;Effortful swallow  Postural Changes and/or Swallow Maneuvers (None)     CHL IP OTHER RECOMMENDATIONS 09/28/2014  Recommended Consults (None)  Oral Care Recommendations Oral care BID  Other Recommendations (None)     CHL IP FOLLOW UP RECOMMENDATIONS 09/26/2014  Follow up Recommendations 24 hour supervision/assistance;Home health SLP     CHL IP FREQUENCY AND DURATION 09/28/2014  Speech Therapy Frequency (ACUTE ONLY) min 2x/week  Treatment Duration 1 week     Pertinent Vitals/Pain None    SLP Swallow Goals No flowsheet data found.  No flowsheet data found.    CHL IP REASON FOR REFERRAL 09/28/2014  Reason for Referral Objectively evaluate swallowing function     CHL IP ORAL PHASE 09/28/2014  Lips (None)  Tongue (None)  Mucous membranes (None)  Nutritional status (None)  Other (None)  Oxygen therapy (None)  Oral Phase WFL  Oral - Pudding Teaspoon (None)  Oral - Pudding Cup (None)  Oral - Honey Teaspoon (None)  Oral - Honey Cup (None)  Oral - Honey Syringe (None)  Oral - Nectar Teaspoon  (None)  Oral - Nectar Cup (None)  Oral - Nectar Straw (None)  Oral - Nectar Syringe (None)  Oral - Ice Chips (None)  Oral - Thin Teaspoon (None)  Oral - Thin Cup (None)  Oral - Thin Straw (None)  Oral - Thin Syringe (None)  Oral - Puree (None)  Oral - Mechanical Soft (None)  Oral - Regular (None)  Oral - Multi-consistency (None)  Oral - Pill (None)  Oral Phase - Comment (None)      CHL IP PHARYNGEAL PHASE 09/28/2014  Pharyngeal Phase Impaired  Pharyngeal - Pudding Teaspoon (None)  Penetration/Aspiration details (pudding teaspoon) (None)  Pharyngeal - Pudding Cup (None)  Penetration/Aspiration details (pudding cup) (None)  Pharyngeal - Honey Teaspoon (None)  Penetration/Aspiration details (honey teaspoon) (None)  Pharyngeal - Honey Cup (None)  Penetration/Aspiration details (honey cup) (None)  Pharyngeal - Honey Syringe (None)  Penetration/Aspiration details (honey syringe) (None)  Pharyngeal - Nectar Teaspoon (None)  Penetration/Aspiration details (nectar teaspoon) (None)  Pharyngeal - Nectar Cup (None)  Penetration/Aspiration details (nectar cup) (None)  Pharyngeal - Nectar Straw (None)  Penetration/Aspiration details (nectar straw) (None)  Pharyngeal - Nectar Syringe (None)  Penetration/Aspiration details (nectar syringe) (None)  Pharyngeal - Ice Chips (None)  Penetration/Aspiration details (ice chips) (None)  Pharyngeal - Thin Teaspoon (None)  Penetration/Aspiration details (thin teaspoon) (None)  Pharyngeal - Thin Cup (None)  Penetration/Aspiration details (thin cup) (None)  Pharyngeal - Thin Straw (None)  Penetration/Aspiration details (thin straw) (None)  Pharyngeal - Thin Syringe (None)  Penetration/Aspiration details (thin syringe') (None)  Pharyngeal - Puree (None)  Penetration/Aspiration details (puree) (None)  Pharyngeal - Mechanical Soft (None)  Penetration/Aspiration details (mechanical soft) (None)  Pharyngeal - Regular (None)  Penetration/Aspiration details (regular) (None)  Pharyngeal -  Multi-consistency (None)  Penetration/Aspiration details (multi-consistency) (None)  Pharyngeal - Pill (None)  Penetration/Aspiration details (pill) (None)  Pharyngeal Comment (None)      CHL IP CERVICAL ESOPHAGEAL PHASE 09/28/2014  Cervical Esophageal Phase WFL  Pudding Teaspoon (None)  Pudding Cup (None)  Honey Teaspoon (None)  Honey Cup (None)  Honey Straw (None)  Nectar Teaspoon (None)  Nectar Cup (  None)  Nectar Straw (None)  Nectar Sippy Cup (None)  Thin Teaspoon (None)  Thin Cup (None)  Thin Straw (None)  Thin Sippy Cup (None)  Cervical Esophageal Comment (None)    CHL IP GO 09/20/2014  Functional Assessment Tool Used clinical judgement  Functional Limitations Swallowing  Swallow Current Status (I0973) CM  Swallow Goal Status (Z3299) CL  Swallow Discharge Status (M4268) (None)  Motor Speech Current Status (T4196) (None)  Motor Speech Goal Status (Q2297) (None)  Motor Speech Goal Status (L8921) (None)  Spoken Language Comprehension Current Status (J9417) (None)  Spoken Language Comprehension Goal Status (E0814) (None)  Spoken Language Comprehension Discharge Status [phone] (None)  Spoken Language Expression Current Status (U3149) (None)  Spoken Language Expression Goal Status (F0263) (None)  Spoken Language Expression Discharge Status [phone]) (None)  Attention Current Status (F0277) (None)  Attention Goal Status (A1287) (None)  Attention Discharge Status (O6767) (None)  Memory Current Status (M0947) (None)  Memory Goal Status (S9628) (None)  Memory Discharge Status (Z6629) (None)  Voice Current Status (U7654) (None)  Voice Goal Status (Y5035) (None)  Voice Discharge Status (W6568) (None)  Other Speech-Language Pathology Functional Limitation [phone]) (None)  Other Speech-Language Pathology Functional Limitation Goal Status (Z0017)  (None)  Other Speech-Language Pathology Functional Limitation Discharge Status  [phone]) (None)    Gunnar Fusi, M.A., CCC-SLP [phone]        [place] 09/28/2014, 11:11 AM    Dg  C-arm 1-60 Min-no Report  09/27/2014   CLINICAL DATA: surgery   C-ARM 1-60 MINUTES  Fluoroscopy was utilized by the requesting physician.  No radiographic  interpretation.    US Thoracentesis Asp Pleural Space W/img Guide  09/19/2014   INDICATION: Breast cancer, dyspnea, recurrent right pleural effusion. Request is made for diagnostic and therapeutic right thoracentesis.  EXAM: ULTRASOUND GUIDED DIAGNOSTIC AND THERAPEUTIC RIGHT THORACENTESIS  COMPARISON:  Prior thoracentesis on 08/24/2014  MEDICATIONS: None  COMPLICATIONS: None immediate  TECHNIQUE: Informed written consent was obtained from the patient after a discussion of the risks, benefits and alternatives to treatment. A timeout was performed prior to the initiation of the procedure.  Initial ultrasound scanning demonstrates a moderate to large right pleural effusion. The lower chest was prepped and draped in the usual sterile fashion. 1% lidocaine was used for local anesthesia.  An ultrasound image was saved for documentation purposes. A 6 Fr Safe-T-Centesis catheter was introduced. The thoracentesis was performed. The catheter was removed and a dressing was applied. The patient tolerated the procedure well without immediate post procedural complication. The patient was escorted to have an upright chest radiograph.  FINDINGS: A total of approximately 1.2 liters of hazy, amber fluid was removed. Requested samples were sent to the laboratory.  IMPRESSION: Successful ultrasound-guided diagnostic and therapeutic right sided thoracentesis yielding 1.2 liters of pleural fluid.  Read by: Rowe Robert, PA-C   Electronically Signed   By: Markus Daft M.D.   On: 09/19/2014 16:31         Subjective: Patient denies fevers, chills, headache, chest pain, dyspnea, nausea, vomiting, diarrhea, abdominal pain, dysuria, hematuria   Objective: Filed Vitals:   10/01/14 1944 10/02/14 0345 10/02/14 1055 10/02/14 1313  BP: 102/49 108/54 111/54 108/60  Pulse: 80 71   71  Temp: 99 F (37.2 C) 99.1 F (37.3 C)  98.3 F (36.8 C)  TempSrc: Oral Oral  Oral  Resp: '17 18  18  '$ Height:      Weight:  56.654 kg (124 lb 14.4 oz)    SpO2: 97% 98%  100%    Intake/Output Summary (Last 24 hours) at 10/02/14 1847 Last data filed at 10/02/14 1630  Gross per 24 hour  Intake    960 ml  Output   2601 ml  Net  -1641 ml   Weight change: -0.146 kg (-5.1 oz) Exam:   General:  Pt is alert, follows commands appropriately, not in acute distress  HEENT: No icterus, No thrush, No neck mass, [place]/AT  Cardiovascular: RRR, S1/S2, no rubs, no gallops  Respiratory: Bibasilar crackles, right greater than left. No wheezing  Abdomen: Soft/+BS, non tender, non distended, no guarding  Extremities: 1+LE edema, No lymphangitis, No petechiae, No rashes, no synovitis  Data Reviewed: Basic Metabolic Panel:  Recent Labs Lab 09/26/14 2219 09/27/14 0349 09/28/14 0238 09/29/14 0340 10/01/14 0335  NA 139 140 141 141 136  K 3.7 3.8 4.0 3.6 3.8  CL 108 108 109 107 102  CO2 '24 23 24 25 28  '$ GLUCOSE 120* 112* 130* 108* 111*  BUN 22* 22* 22* 21* 21*  CREATININE 1.50* 1.49* 1.50* 1.59* 1.54*  CALCIUM 9.0 8.9 9.1 9.1 9.2   Liver Function Tests:  Recent Labs Lab 09/26/14 2219 09/29/14 0340  AST 21 16  ALT 16 12*  ALKPHOS 74 54  BILITOT 0.7 0.5  PROT 6.0* 5.7*  ALBUMIN 3.0* 2.9*   No results for input(s): LIPASE, AMYLASE in the last 168 hours. No results for input(s): AMMONIA in the last 168 hours. CBC:  Recent Labs Lab 09/28/14 1021 09/29/14 0340 09/30/14 0338 10/01/14 0335 10/02/14 0341  WBC 5.3 4.5 5.1 4.7 5.4  HGB 9.5* 8.3* 8.3* 8.5* 8.4*  HCT 29.1* 25.7* 25.3* 26.2* 25.6*  MCV 87.7 86.5 88.2 86.5 87.4  PLT 290 214 277 306 250   Cardiac Enzymes: No results for input(s): CKTOTAL, CKMB, CKMBINDEX, TROPONINI in the last 168 hours. BNP: Invalid input(s): POCBNP CBG:  Recent Labs Lab 09/30/14 1626 09/30/14 2121 10/01/14 0605 10/01/14 1123  10/01/14 1631  GLUCAP 83 108* 87 102* 108*    Recent Results (from the past 240 hour(s))  Body fluid culture     Status: None   Collection Time: 09/27/14 11:44 AM  Result Value Ref Range Status   Specimen Description FLUID RIGHT PLEURAL  Final   Special Requests PART B  Final   Gram Stain   Final    WBC PRESENT,BOTH PMN AND MONONUCLEAR NO ORGANISMS SEEN CYTOSPIN SMEAR    Culture NO GROWTH 3 DAYS  Final   Report Status 09/30/2014 FINAL  Final     Scheduled Meds: . amiodarone  200 mg Oral BID  . anastrozole  1 mg Oral Q breakfast  . atorvastatin  10 mg Oral q1800  . feeding supplement (ENSURE ENLIVE)  237 mL Oral BID BM  . ferrous sulfate  325 mg Oral TID WC  . linagliptin  5 mg Oral Daily  . metoprolol tartrate  12.5 mg Oral QID  . pantoprazole  40 mg Oral Daily  . polyethylene glycol  17 g Oral Daily  . warfarin   Does not apply Once  . Warfarin - Pharmacist Dosing Inpatient   Does not apply q1800   Continuous Infusions: . heparin 1,150 Units/hr (10/02/14 0137)     Renessa Wellnitz, DO  [facility] Pager [phone]  If 7PM-7AM, please contact night-coverage [email or web address] Password [facility] 10/02/2014, 6:47 PM   LOS: 11 days

## 2014-10-02 NOTE — Progress Notes (Signed)
Ref: Maximino Greenland, MD   Subjective:  Slow and steady progress daily. T max 99.1 degree F. Sinus rhythm.  Objective:  Vital Signs in the last 24 hours: Temp:  [98.3 F (36.8 C)-99.1 F (37.3 C)] 98.3 F (36.8 C) (07/25 1313) Pulse Rate:  [71-80] 71 (07/25 1313) Cardiac Rhythm:  [-] Normal sinus rhythm (07/24 2005) Resp:  [17-18] 18 (07/25 1313) BP: (102-111)/(49-60) 108/60 mmHg (07/25 1313) SpO2:  [97 %-100 %] 100 % (07/25 1313) Weight:  [56.654 kg (124 lb 14.4 oz)] 56.654 kg (124 lb 14.4 oz) (07/25 0345)  Physical Exam: BP Readings from Last 1 Encounters:  10/02/14 108/60    Wt Readings from Last 1 Encounters:  10/02/14 56.654 kg (124 lb 14.4 oz)    Weight change: -0.146 kg (-5.1 oz)  HEENT: [place]/AT, Eyes-Brown, PERL, EOMI, Conjunctiva-Pale, Sclera-Non-icteric Neck: No JVD, No bruit, Trachea midline. Lungs:  Clearing, Bilateral. Cardiac:  Regular rhythm, normal S1 and S2, no S3. II/VI systolic murmur. Abdomen:  Soft, non-tender. Extremities:  Trace edema present. No cyanosis. No clubbing. CNS: AxOx3, Cranial nerves grossly intact, moves all 4 extremities. Right handed. Skin: Warm and dry.   Intake/Output from previous day: 07/24 0701 - 07/25 0700 In: 120 [P.O.:120] Out: 550 [Urine:550]    Lab Results: BMET    Component Value Date/Time   NA 136 10/01/2014 0335   NA 141 09/29/2014 0340   NA 141 09/28/2014 0238   NA 143 09/18/2014 0946   NA 142 08/04/2014 1344   NA 141 07/11/2014 1102   K 3.8 10/01/2014 0335   K 3.6 09/29/2014 0340   K 4.0 09/28/2014 0238   K 4.0 09/18/2014 0946   K 4.4 08/04/2014 1344   K 4.1 07/11/2014 1102   CL 102 10/01/2014 0335   CL 107 09/29/2014 0340   CL 109 09/28/2014 0238   CL 107 08/05/2012 1009   CL 108* 06/03/2012 1553   CL 105 04/21/2012 0806   CO2 28 10/01/2014 0335   CO2 25 09/29/2014 0340   CO2 24 09/28/2014 0238   CO2 30* 09/18/2014 0946   CO2 26 08/04/2014 1344   CO2 28 07/11/2014 1102   GLUCOSE 111* 10/01/2014  0335   GLUCOSE 108* 09/29/2014 0340   GLUCOSE 130* 09/28/2014 0238   GLUCOSE 94 09/18/2014 0946   GLUCOSE 108 08/04/2014 1344   GLUCOSE 119 07/11/2014 1102   GLUCOSE 128* 08/05/2012 1009   GLUCOSE 85 06/03/2012 1553   GLUCOSE 152* 04/21/2012 0806   BUN 21* 10/01/2014 0335   BUN 21* 09/29/2014 0340   BUN 22* 09/28/2014 0238   BUN 31.2* 09/18/2014 0946   BUN 45.5* 08/04/2014 1344   BUN 27.8* 07/11/2014 1102   CREATININE 1.54* 10/01/2014 0335   CREATININE 1.59* 09/29/2014 0340   CREATININE 1.50* 09/28/2014 0238   CREATININE 1.7* 09/18/2014 0946   CREATININE 1.8* 08/04/2014 1344   CREATININE 1.4* 07/11/2014 1102   CALCIUM 9.2 10/01/2014 0335   CALCIUM 9.1 09/29/2014 0340   CALCIUM 9.1 09/28/2014 0238   CALCIUM 11.8* 09/18/2014 0946   CALCIUM 11.6* 08/04/2014 1344   CALCIUM 10.7* 07/27/2014 0830   CALCIUM 11.3* 07/11/2014 1102   GFRNONAA 31* 10/01/2014 0335   GFRNONAA 30* 09/29/2014 0340   GFRNONAA 32* 09/28/2014 0238   GFRAA 36* 10/01/2014 0335   GFRAA 35* 09/29/2014 0340   GFRAA 37* 09/28/2014 0238   CBC    Component Value Date/Time   WBC 5.4 10/02/2014 0341   WBC 4.5 09/18/2014 0946   RBC 2.93*  10/02/2014 0341   RBC 3.34* 09/18/2014 0946   RBC 2.80* 01/24/2012 1011   HGB 8.4* 10/02/2014 0341   HGB 9.6* 09/18/2014 0946   HCT 25.6* 10/02/2014 0341   HCT 29.4* 09/18/2014 0946   PLT 250 10/02/2014 0341   PLT 319 09/18/2014 0946   MCV 87.4 10/02/2014 0341   MCV 87.9 09/18/2014 0946   MCH 28.7 10/02/2014 0341   MCH 28.7 09/18/2014 0946   MCHC 32.8 10/02/2014 0341   MCHC 32.7 09/18/2014 0946   RDW 16.6* 10/02/2014 0341   RDW 16.8* 09/18/2014 0946   LYMPHSABS 0.6* 09/19/2014 0750   LYMPHSABS 0.6* 09/18/2014 0946   MONOABS 0.8 09/19/2014 0750   MONOABS 0.6 09/18/2014 0946   EOSABS 0.5 09/19/2014 0750   EOSABS 0.3 09/18/2014 0946   BASOSABS 0.1 09/19/2014 0750   BASOSABS 0.1 09/18/2014 0946   HEPATIC Function Panel  Recent Labs  09/20/14 1510 09/26/14 2219  09/29/14 0340  PROT 6.5 6.0* 5.7*   HEMOGLOBIN A1C No components found for: HGA1C,  MPG CARDIAC ENZYMES Lab Results  Component Value Date   TROPONINI 0.29* 09/21/2014   TROPONINI 0.30* 09/21/2014   TROPONINI 0.33* 09/20/2014   BNP No results for input(s): PROBNP in the last 8760 hours. TSH  Recent Labs  07/27/14 0515 08/23/14 0822  TSH 1.036 1.115   CHOLESTEROL No results for input(s): CHOL in the last 8760 hours.  Scheduled Meds: . amiodarone  200 mg Oral BID  . anastrozole  1 mg Oral Q breakfast  . atorvastatin  10 mg Oral q1800  . feeding supplement (ENSURE ENLIVE)  237 mL Oral BID BM  . ferrous sulfate  325 mg Oral TID WC  . linagliptin  5 mg Oral Daily  . metoprolol tartrate  12.5 mg Oral QID  . pantoprazole  40 mg Oral Daily  . polyethylene glycol  17 g Oral Daily  . warfarin   Does not apply Once  . Warfarin - Pharmacist Dosing Inpatient   Does not apply q1800   Continuous Infusions: . heparin 1,150 Units/hr (10/02/14 0137)   PRN Meds:.acetaminophen **OR** [DISCONTINUED] acetaminophen, albuterol, bisacodyl, morphine injection, [DISCONTINUED] ondansetron **OR** ondansetron (ZOFRAN) IV  Assessment/Plan: Diastolic heart failure, acute on chronic Paroxysmal Atrial fibrillation Right breast cancer in remission Hyperlipidemia Hypertension DM, II Recurrent pleural effusions Large substernal thyroid goiter Hypertrophic cardiomyopathy with mild LV outflow obstruction Quadricuspid AV without stenosis Dilated LA and RA Small Pericardial effusion Iron deficiency anemia Emphysema Hypoalbuminemia  Continue medical management.    LOS: 11 days    Dixie Dials  MD  10/02/2014, 6:57 PM

## 2014-10-02 NOTE — Progress Notes (Signed)
Speech Language Pathology Treatment: Dysphagia  Patient Details Name: Alison Gross MRN: [id number] DOB: October 17, 1932 Today's Date: 10/02/2014 Time: 5427-0623 SLP Time Calculation (min) (ACUTE ONLY): 9 min  Assessment / Plan / Recommendation Clinical Impression  Dysphagia intervention with observation of thin liquids and min verbal cues to accurately utilize chin tuck (she verbalized strategy independently). Mild fever and lung sounds with fine crackles per nursing notes. Possibility of intermittent aspiration. She is content with and would like to continue Dys 2 texture, continue thin liquids with chin tuck (for liquids). Will continue ST to reiterate precautions.    HPI Other Pertinent Information: 81 yo female with hx of breast cancer, right nodule goiter, HLD, afib on chronic anticoagulation, HTN, DM2 on tradjenta and hx of recurrent R sided pleural effusions who presented to the ED with subjectively worsening sob over the past several days per MD note. Pt is s/p thoracentesis 6/16 yielding 800cc of fluid and thoracentesis during this admission.  Chest imaging demonstrated moderate R sided effusion. Pt remained without hypoxia or tachypnea. Swallow evaluation ordered.  Pt reports dysphagia with food/drink over the last few months.  Has some chronic dysphagia due to goiter and voice changes after goiter surgery approximately 40 years ago.  Pt admits to weight loss but denies dysphagia being source. Pt reports frequently choking on bulky items as well as with liquids.   Pt has never had swallow evaluation completed.  CXR showed development of right lobe infiltrate, ? aspiraton.     Pertinent Vitals Pain Assessment: No/denies pain  SLP Plan  Continue with current plan of care    Recommendations Diet recommendations: Dysphagia 2 (fine chop);Thin liquid Liquids provided via: Cup Medication Administration: Whole meds with puree Supervision: Patient able to self feed Compensations: Chin tuck;Slow  rate;Small sips/bites;Follow solids with liquid;Effortful swallow Postural Changes and/or Swallow Maneuvers: Seated upright 90 degrees;Upright 30-60 min after meal              Oral Care Recommendations: Oral care BID Follow up Recommendations: 24 hour supervision/assistance;Home health SLP Plan: Continue with current plan of care    GO     Houston Siren 10/02/2014, 1:50 PM   Orbie Pyo Colvin Caroli.Ed [employer] [phone]

## 2014-10-02 NOTE — Care Management Important Message (Signed)
Important Message  Patient Details  Name: Alison Gross MRN: [id number] Date of Birth: [birth year]   Medicare Important Message Given:  Yes-fourth notification given    [name] May 10/02/2014, 2:22 [place] Message  Patient Details  Name: Alison Gross MRN: [id number] Date of Birth: 23-Mar-1932   Medicare Important Message Given:  Yes-fourth notification given    [name] May 10/02/2014, 2:22 PM

## 2014-10-02 NOTE — Progress Notes (Signed)
Physical Therapy Treatment Patient Details Name: Alison Gross MRN: [id number] DOB: 1932/04/17 Today's Date: 10/02/2014    History of Present Illness 81 yo female with hx of breast cancer, right nodule goiter, HLD, afib on chronic anticoagulation, HTN, DM2 on tradjenta and hx of recurrent R sided pleural effusions who presented to the ED with subjectively worsening sob over the past several days per MD note. Pt is s/p thoracentesis 6/16 yielding 800cc of fluid and thoracentesis during this admission. Chest imaging demonstrated moderate R sided effusion. Pt remained without hypoxia or tachypnea.    PT Comments    Pt progressing towards physical therapy goals. She was able to ambulate in hall without UE support, however hands-on support was provided for safety as pt was somewhat unsteady. Physical assist was required for steadying only. Pt reports she will have family available 24 hours at d/c. Will continue to follow.   Follow Up Recommendations  No PT follow up     Equipment Recommendations  None recommended by PT    Recommendations for Other Services       Precautions / Restrictions Precautions Precautions: None Restrictions Weight Bearing Restrictions: No    Mobility  Bed Mobility               General bed mobility comments: Pt sitting at EOB when PT arrived.   Transfers Overall transfer level: Needs assistance Equipment used: None Transfers: Sit to/from Stand Sit to Stand: Supervision         General transfer comment: Needs supervision for safety and need for extra time to complete  Ambulation/Gait Ambulation/Gait assistance: Min guard Ambulation Distance (Feet): 200 Feet Assistive device: None Gait Pattern/deviations: Step-through pattern;Decreased stride length;Trunk flexed Gait velocity: decreased Gait velocity interpretation: Below normal speed for age/gender General Gait Details: Pt with slow cadence and guarded gait. Does not report DOE and pt does  not appear to be labored in her breathing.    Stairs            Wheelchair Mobility    Modified Rankin (Stroke Patients Only)       Balance Overall balance assessment: Needs assistance Sitting-balance support: Feet supported;No upper extremity supported Sitting balance-Leahy Scale: Good     Standing balance support: During functional activity;No upper extremity supported Standing balance-Leahy Scale: Fair                      Cognition Arousal/Alertness: Awake/alert Behavior During Therapy: WFL for tasks assessed/performed Overall Cognitive Status: Within Functional Limits for tasks assessed                      Exercises General Exercises - Lower Extremity Long Arc Quad: 10 reps Hip ABduction/ADduction: 10 reps    General Comments        Pertinent Vitals/Pain Pain Assessment: No/denies pain    Home Living                      Prior Function            PT Goals (current goals can now be found in the care plan section) Acute Rehab PT Goals Patient Stated Goal: To get stronger and go home PT Goal Formulation: With patient/family Time For Goal Achievement: 10/07/14 Potential to Achieve Goals: Good Progress towards PT goals: Progressing toward goals    Frequency  Min 3X/week    PT Plan Current plan remains appropriate    Co-evaluation  End of Session Equipment Utilized During Treatment: Gait belt Activity Tolerance: Patient tolerated treatment well Patient left: in chair;with call bell/phone within reach     Time: 1123-1148 PT Time Calculation (min) (ACUTE ONLY): 25 min  Charges:  $Gait Training: 8-22 mins $Therapeutic Activity: 8-22 mins                    G Codes:      Rolinda Roan 10-13-14, 1:55 PM   Rolinda Roan, PT, DPT Acute Rehabilitation Services Pager: [phone]

## 2014-10-02 NOTE — Progress Notes (Signed)
Nutrition Follow Up  DOCUMENTATION CODES:   Not applicable  INTERVENTION:    D/C Boost Breeze   Continue Ensure Enlive po BID, each supplement provides 350 kcal and 20 grams of protein  NUTRITION DIAGNOSIS:   Inadequate oral intake related to dysphagia as evidenced by meal completion < 50%, improved  GOAL:   Patient will meet greater than or equal to 90% of their needs, progressing  MONITOR:   PO intake, Supplement acceptance, Labs, Weight trends, I & O's  ASSESSMENT:  81 y.o. Female with hx of goiter, HLD, afib on chronic anticoagulation, HTN, DM2 on tradjenta and hx of recurrent R sided pleural effusions who presents to the ED with subjectively worsening sob over the past several days. Pt is s/p thoracentesis 6/16 yielding 800cc of borderline exudative fluid, thought more likely transudative at that time. In ED, chest imaging demonstrated moderate R sided effusion. Pt remained without hypoxia or tachypnea. Hospitalist consulted for admission.   Patient s/p procedure 7/12: THORACENTESIS   Pt reports she's eating well.  Average PO intake at meals is 70%.  Pt enjoys Ensure Enlive supplements, however, dislikes [employer].  RD to discontinue Boost Breeze.  Noted probable discharge tomorrow.  Diet Order:  DIET DYS 2 Room service appropriate?: Yes; Fluid consistency:: Thin  Skin:  Reviewed, no issues  Last BM:  7/22  Height:   Ht Readings from Last 1 Encounters:  09/19/14 '5\' 5"'$  (1.651 m)    Weight:   Wt Readings from Last 1 Encounters:  10/02/14 124 lb 14.4 oz (56.654 kg)    Ideal Body Weight:  56.8 kg  Wt Readings from Last 10 Encounters:  10/02/14 124 lb 14.4 oz (56.654 kg)  08/25/14 104 lb 11.2 oz (47.492 kg)  08/04/14 117 lb 3.2 oz (53.162 kg)  07/28/14 113 lb 14.4 oz (51.665 kg)  07/18/14 120 lb 4.8 oz (54.568 kg)  01/25/14 126 lb 3.2 oz (57.244 kg)  11/29/13 130 lb (58.968 kg)  10/25/13 131 lb 11.2 oz (59.739 kg)  10/12/13 132 lb (59.875 kg)   07/21/13 132 lb 8 oz (60.102 kg)    BMI:  Body mass index is 20.78 kg/(m^2).  Estimated Nutritional Needs:   Kcal:  1700-1900  Protein:  80-90 gm  Fluid:  1.7-1.9 L  EDUCATION NEEDS:   No education needs identified at this time  Arthur Holms, RD, LDN Pager #: [phone] After-Hours Pager #: [phone]

## 2014-10-02 NOTE — Progress Notes (Signed)
PleurX catheter drained 835m of serous fluid obtained pt tolerated well, some pain at end of draining, Prn pain medication administered as needed for pain. Colbey Wirtanen, KBettina GaviaRN

## 2014-10-02 NOTE — Progress Notes (Signed)
ANTICOAGULATION CONSULT NOTE - Follow Up Consult  Pharmacy Consult for heparin and coumadin Indication: atrial fibrillation and RUE DVT  No Known Allergies  Patient Measurements: Height: '5\' 5"'$  (165.1 cm) Weight: 124 lb 14.4 oz (56.654 kg) IBW/kg (Calculated) : 57 Heparin Dosing Weight: 58.5 kg  Vital Signs: Temp: 98.3 F (36.8 C) (07/25 1313) Temp Source: Oral (07/25 1313) BP: 108/60 mmHg (07/25 1313) Pulse Rate: 71 (07/25 1313)  Labs:  Recent Labs  09/30/14 0338 10/01/14 0335 10/01/14 1249 10/02/14 0341  HGB 8.3* 8.5*  --  8.4*  HCT 25.3* 26.2*  --  25.6*  PLT 277 306  --  250  LABPROT 15.9* 17.3*  --  20.6*  INR 1.26 1.41  --  1.77*  HEPARINUNFRC 0.27* 0.55 0.47 0.44  CREATININE  --  1.54*  --   --     Estimated Creatinine Clearance: 26.5 mL/min (by C-G formula based on Cr of 1.54).  Assessment: 81 y/o F s/p PleurX catheter placement and on heparin at 1150 units/hr for afib and DVT (New RUE DVT on 07/16). Heparin level at goal (HL= 0.44),CBC stable with no bleeding reported. INR 1.77 after 3 doses of coumadin - moving in right direction but below goal of 2 - 3.     Xarelto PTA but due to renal function on coumadin (started 7/22; day 4 of overlap).  Pt offered LMWH for bridge therapy at home but she declined.  Pt on amiodarone which may increase INR.   Goal of Therapy:  Heparin level 0.3-0.7 units/ml Monitor platelets by anticoagulation protocol: Yes   Plan:  -Continue heparin 1150 units/hr -Heparin level and CBC in am -Coumadin '5mg'$  tonight x1 - dose given early today to facilitate discharge tomorrow.  -Daily PT/INR Eudelia Bunch, Pharm.D. [phone] 10/02/2014 3:49 PM

## 2014-10-02 NOTE — Care Management Note (Signed)
Case Management Note Marvetta Gibbons RN, BSN Unit 2W-Case Manager [phone]  Patient Details  Name: Alison Gross MRN: [id number] Date of Birth: 04-08-1932  Subjective/Objective:    Pt admitted for recurrent pl effusion and DVT                Action/Plan: PTA pt lived at home, NCM to follow for d/c needs- may need [facility]- plan for pleurX cath placement on 09/27/14  Expected Discharge Date:               Expected Discharge Plan:  [place] (pleurx insertion 7/20)  In-House Referral:     Discharge planning Services  CM Consult  Post Acute Care Choice:  Home Health Choice offered to:  Patient, Spouse  DME Arranged:    DME Agency:     HH Arranged:  RN [place] Agency:  [place]  Status of Service:  In process, will continue to follow  Medicare Important Message Given:  Yes-third notification given Date Medicare IM Given:    Medicare IM give by:    Date Additional Medicare IM Given:    Additional Medicare Important Message give by:     If discussed at [place] of Stay Meetings, dates discussed:  09/26/14, 09/28/14  Additional Comments:  10-02-14 9:30am [name], RNBSN [phone] Spoke with patient and husband at bedside.  Do not want to take lovenox shots, even if small dose for 3 days.  Physician feels will be therapeutic tomorrow and plans for dc tomorrow.  HH with [place] set up and have been in and talked with patient and husband.  Asked nurse to order a case of pleurix cathetors for patient to take home.  Has PCP.    09/29/14- f/u done with pt and spouse at bedside regarding [facility] agency of choice- per pt and spouse they would like to use [facility] for [facility] services- referral called to Santiago Glad with [facility]- pt needs HH orders still for HH-RN with instructions for pleurX cath drainage - PleurX cath forms have been started- will need completed prior to discharge and fax to [place] then mail originals to address in packet- CM to f/u on this Monday 10/03/14 prior to  pt being discharged. Pt also still needs large box of PleurX drainage kits ordered to take home- Bedside RN aware.   09/27/14- 1430- pleurx cath placed today- signed forms found on shadow chart- spoke with pt/spouse- and daughter [name] at bedside- pt also has another daughter [name] who was not present- per conversation with pt and family plan is for pt to return home- [facility] has seen pt and will follow once pt returns home- also discussed [place] needs for home regarding PleurX cath drainage needs- list of [facility] agencies for [facility] left with pt/family for review will f/u with them for choice and arrange [place] once orders placed for discharge- PleurX forms started and will fax to [place] once [facility] agency known- will also speak with bedside RN to be sure box of catheter drainage kits is ordered for pt to take home on discharge.  NCM to continue to follow for d/c needs/planning  Vergie Living, RN 10/02/2014, 9:53 AM

## 2014-10-03 LAB — BASIC METABOLIC PANEL
Anion gap: 7 (ref 5–15)
BUN: 21 mg/dL — AB (ref 6–20)
CO2: 27 mmol/L (ref 22–32)
Calcium: 9.8 mg/dL (ref 8.9–10.3)
Chloride: 104 mmol/L (ref 101–111)
Creatinine, Ser: 1.57 mg/dL — ABNORMAL HIGH (ref 0.44–1.00)
GFR calc Af Amer: 35 mL/min — ABNORMAL LOW (ref >60)
GFR calc non Af Amer: 30 mL/min — ABNORMAL LOW (ref >60)
Glucose, Bld: 101 mg/dL — ABNORMAL HIGH (ref 65–99)
Potassium: 4 mmol/L (ref 3.5–5.1)
Sodium: 138 mmol/L (ref 135–145)

## 2014-10-03 LAB — PROTIME-INR
INR: 3.11 — AB (ref 0.00–1.49)
Prothrombin Time: 31.5 seconds — ABNORMAL HIGH (ref 11.6–15.2)

## 2014-10-03 LAB — CBC
HEMATOCRIT: 26.9 % — AB (ref 36.0–46.0)
HEMOGLOBIN: 8.8 g/dL — AB (ref 12.0–15.0)
MCH: 28.8 pg (ref 26.0–34.0)
MCHC: 32.7 g/dL (ref 30.0–36.0)
MCV: 87.9 fL (ref 78.0–100.0)
Platelets: 305 10*3/uL (ref 150–400)
RBC: 3.06 MIL/uL — AB (ref 3.87–5.11)
RDW: 16.5 % — ABNORMAL HIGH (ref 11.5–15.5)
WBC: 5.3 10*3/uL (ref 4.0–10.5)

## 2014-10-03 LAB — HEPARIN LEVEL (UNFRACTIONATED): Heparin Unfractionated: 0.44 IU/mL (ref 0.30–0.70)

## 2014-10-03 MED ORDER — METOPROLOL TARTRATE 25 MG PO TABS: 12.5000 mg | ORAL_TABLET | Freq: Two times a day (BID) | ORAL | Status: DC

## 2014-10-03 MED ORDER — WARFARIN SODIUM 2.5 MG PO TABS: 2.5000 mg | ORAL_TABLET | Freq: Every day | ORAL | Status: DC

## 2014-10-03 MED ORDER — METOPROLOL TARTRATE 12.5 MG HALF TABLET
12.5000 mg | ORAL_TABLET | Freq: Two times a day (BID) | ORAL | Status: DC
  Administered 2014-10-03: 12.5 mg via ORAL
  Filled 2014-10-03 (×2): qty 1

## 2014-10-03 MED ORDER — AMIODARONE HCL 200 MG PO TABS: 200.0000 mg | ORAL_TABLET | Freq: Two times a day (BID) | ORAL | Status: DC

## 2014-10-03 NOTE — Patient Outreach (Signed)
[facility]) Care Management  10/03/2014  IRELYN PERFECTO [birth year] [phone]   Referral from Marthenia Rolling, RN, assigned Raina Mina, RN.  Ronnell Freshwater. [place], [place] Management [place] Assistant Phone: [phone] Fax: [phone]

## 2014-10-03 NOTE — Care Management Note (Signed)
**Note Alison-Identified via Obfuscation** Case Management Note Alison Gibbons RN, BSN Unit 2W-Case Manager [phone]  Patient Details  Name: Alison Gross MRN: [id number] Date of Birth: 1932/10/29  Subjective/Objective:    Pt admitted for recurrent pl effusion and DVT                Action/Plan: PTA pt lived at home, NCM to follow for d/c needs- may need [facility]- plan for pleurX cath placement on 09/27/14  Expected Discharge Date:               Expected Discharge Plan:  Alison Gross (pleurx insertion 7/20)  In-House Referral:     Discharge planning Services  CM Consult  Post Acute Care Choice:  Home Health Choice offered to:  Patient, Spouse  DME Arranged:    DME Agency:     HH Arranged:  RN Alison Gross Agency:  Alison Gross  Status of Service:  Completed, signed off  Medicare Important Message Given:  Yes-fourth notification given Date Medicare IM Given:    Medicare IM give by:    Date Additional Medicare IM Given:    Additional Medicare Important Message give by:     If discussed at [place] of Stay Meetings, dates discussed:  09/26/14, 09/28/14, 10-02-14  Additional Comments:   10-03-14 9:30am Plan for discharge home.  Talked with physician and [facility] laison, Alison Gross.  Plan for pleurix emptying on M-W-F.  Plan for INR draw on Wed 7-27 and call to Dr. Mila Gross office.  Patient updated.  Case of pleurix cathetors in room for patient to take home with them.  Patient has no further questions.  Pleurix paperwork to be faxed and will leave with CMA to mail.   10-02-14 9:30am Alison Gross, RNBSN [phone] Spoke with patient and husband at bedside.  Do not want to take lovenox shots, even if small dose for 3 days.  Physician feels will be therapeutic tomorrow and plans for dc tomorrow.  HH with [place] set up and have been in and talked with patient and husband.  Asked nurse to order a case of pleurix cathetors for patient to take home.  Has PCP.    09/29/14- f/u done with pt and spouse at bedside regarding [facility]  agency of choice- per pt and spouse they would like to use [facility] for [facility] services- referral called to Alison Gross with [facility]- pt needs HH orders still for HH-RN with instructions for pleurX cath drainage - PleurX cath forms have been started- will need completed prior to discharge and fax to [place] then mail originals to address in packet- CM to f/u on this Monday 10/03/14 prior to pt being discharged. Pt also still needs large box of PleurX drainage kits ordered to take home- Bedside RN aware.   09/27/14- 1430- pleurx cath placed today- signed forms found on shadow chart- spoke with pt/spouse- and daughter Alison Gross at bedside- pt also has another daughter Alison Gross who was not present- per conversation with pt and family plan is for pt to return home- [facility] has seen pt and will follow once pt returns home- also discussed [place] needs for home regarding PleurX cath drainage needs- list of [facility] agencies for [facility] left with pt/family for review will f/u with them for choice and arrange [place] once orders placed for discharge- PleurX forms started and will fax to [place] once [facility] agency known- will also speak with bedside RN to be sure box of catheter drainage kits is ordered for pt  to take home on discharge.  NCM to continue to follow for d/c needs/planning  Alison Living, RN 10/03/2014, 1:34 PM

## 2014-10-03 NOTE — Progress Notes (Signed)
ANTICOAGULATION CONSULT NOTE - Follow Up Consult  Pharmacy Consult for coumadin Indication: atrial fibrillation and RUE DVT  No Known Allergies  Patient Measurements: Height: '5\' 5"'$  (165.1 cm) Weight: 123 lb 11.2 oz (56.11 kg) IBW/kg (Calculated) : 57 Heparin Dosing Weight: 58.5 kg  Vital Signs: Temp: 98.3 F (36.8 C) (07/26 0425) Temp Source: Oral (07/26 0425) BP: 103/48 mmHg (07/26 0425) Pulse Rate: 69 (07/26 0425)  Labs:  Recent Labs  10/01/14 0335 10/01/14 1249 10/02/14 0341 10/03/14 0327  HGB 8.5*  --  8.4* 8.8*  HCT 26.2*  --  25.6* 26.9*  PLT 306  --  250 305  LABPROT 17.3*  --  20.6* 31.5*  INR 1.41  --  1.77* 3.11*  HEPARINUNFRC 0.55 0.47 0.44 0.44  CREATININE 1.54*  --   --  1.57*    Estimated Creatinine Clearance: 25.7 mL/min (by C-G formula based on Cr of 1.57).  Assessment: 81 y/o F s/p PleurX catheter placement on heparin for afib and DVT (New RUE DVT on 07/16) Bridging to warfarin for outpatient treatment. CBC stable with no bleeding reported.  INR 3.11 after 4 doses of 5 mg coumadin - elevated with goal of 2 - 3.   Xarelto PTA but due to renal function on coumadin (started 7/22; day 4 of overlap).  Pt offered LMWH for bridge therapy at home but she declined.  Pt on amiodarone which may increase INR.   Goal of Therapy:  Warfarin - INR 2.0 - 3.0  Monitor platelets by anticoagulation protocol: Yes   Plan:  -D/c heparin -Hold coumadin today 7/26 and tomorrow 7/27 -Start coumadin 2.5 mg 7/28 and follow-up outpatient for plan of coumadin therapy.  Melburn Popper, PharmD Clinical Pharmacy Resident Pager: [phone] 10/03/2014 9:06 AM

## 2014-10-03 NOTE — Progress Notes (Signed)
Speech Language Pathology Treatment: Dysphagia  Patient Details Name: Alison Gross MRN: [id number] DOB: 02-17-1933 Today's Date: 10/03/2014 Time: 2703-5009 SLP Time Calculation (min) (ACUTE ONLY): 12 min  Assessment / Plan / Recommendation Clinical Impression  Pt seen prior to d/c to reinforce chin tuck strategy. Pt reported to have recalled strategy with staff, though occasionally forgetting during meals. Pt performed chin tuck under SLP supervision but needed min verbal cues to fully complete tuck prior to swallow initiation. Improved with instruction. Pt verbalized understanding of precautions.    HPI Other Pertinent Information: 81 yo female with hx of breast cancer, right nodule goiter, HLD, afib on chronic anticoagulation, HTN, DM2 on tradjenta and hx of recurrent R sided pleural effusions who presented to the ED with subjectively worsening sob over the past several days per MD note. Pt is s/p thoracentesis 6/16 yielding 800cc of fluid and thoracentesis during this admission.  Chest imaging demonstrated moderate R sided effusion. Pt remained without hypoxia or tachypnea. Swallow evaluation ordered.  Pt reports dysphagia with food/drink over the last few months.  Has some chronic dysphagia due to goiter and voice changes after goiter surgery approximately 40 years ago.  Pt admits to weight loss but denies dysphagia being source. Pt reports frequently choking on bulky items as well as with liquids.   Pt has never had swallow evaluation completed.  CXR showed development of right lobe infiltrate, ? aspiraton.     Pertinent Vitals Pain Assessment: No/denies pain  SLP Plan  Continue with current plan of care    Recommendations Diet recommendations: Dysphagia 2 (fine chop);Thin liquid Liquids provided via: Cup Medication Administration: Whole meds with puree Supervision: Patient able to self feed Compensations: Chin tuck;Slow rate;Small sips/bites;Follow solids with liquid;Effortful  swallow Postural Changes and/or Swallow Maneuvers: Seated upright 90 degrees;Upright 30-60 min after meal              Plan: Continue with current plan of care    GO     Betania Dizon, Katherene Ponto 10/03/2014, 12:18 PM

## 2014-10-03 NOTE — Discharge Summary (Addendum)
Physician Discharge Summary  [name] KTG:[id number] DOB: 15-Jul-1932 DOA: 09/19/2014  PCP: Maximino Greenland, MD  Admit date: 09/19/2014 Discharge date: 10/03/2014  Recommendations for Outpatient Follow-up:  1. Pt will need to follow up with PCP in 2 weeks post discharge 2. Please obtain BMP and CBC in one week 3. Check INR on 10/04/14 and 10/07/14  and adjust coumadin level accordingly patient has been instructed to restart Coumadin 2.5 mg on 10/05/2014.   Discharge Diagnoses:  Goiter, large and compressing the right lung and likely contributing to her effusion, swallowing and speech difficulties. See above. TSH 1.115 on 6/15 and fT3 and fT4 wnl - Status post remote partial thyroidectomy and RAI in 2015. Reviewed old records and patient had seen Dr. Chalmers Cater, endocrinology in March 2013 - Patient was transferred from [facility] to [facility] on 7/16 for thoracic surgery opinion regarding goiter surgery. - As per Dr. Virgie Dad discussion with radiation oncology-not appropriate for radiation treatment - 2 [facility] surgeons have evaluated patient and determined that she is high risk candidate for major surgery i.e. thyroidectomy.  -right Pleurx catheter placement 7/20 for recurrent symptomatic pleural effusion.  Recurrent right pleural effusion  - etiology unclear -likely secondary to her goiter vs CHF - transudate by Lights Criteria but had WBC 501 - Status post thoracentesis by interventional radiology on 7/12 - Cytology from 6/16 and 7/12 both with reactive mesothelial cells - Pleural fluid Cx NG - Pleurx 7/20. -Dr. Algis Liming discussed with Dr. [name] on 7/19 and he recommended repeating Echo to reassess Pericardial fluid-->small amount of pericardial fluid without features of tamponade -2.8L drainage from Pleurx since placement on 09/27/14 -change drainage schedule to q 48hrs, next drainage of Pleurx on 7/27  Acute on Chronic diastolic heart failure - Significantly volume  overloaded. Started IV Lasix 7/17-dosed daily based on BMP - daily weight - lasix (started 7/17)--has received 4 doses -Neg 10 pounds since admission -further dosing per cardiology-->2 po doses lasix 7/21-7/22 -7/23--d/c foley -weight--123 pound on day of d/c  Paroxysmal atrial fibrillation,  - Patient had A. fib with RVR overnight 7/16 and was started on Cardizem drip and titrate up to 10 mg/h. She reverted to sinus rhythm. Remains in sinus rhythm. Cardiology has changed to oral Cardizem 7/16. -d/c diltiazem as pt has pt only received one dose in past 48 hours due to soft BPs -decreased metoprolol dosing to 12.5 mg q6 due to soft BP -Due to the patient's EKG stage IV, unable to use factor Xa inhibitors. We'll need to continue heparin bridge with initiation of warfarin. -started on amiodarone by cardiology on 7/21--'200mg'$  bid -now in sinus rhythm -lasix per cardiology -Patient home with metoprolol tartrate 12.5 mg twice a day -Orders will be given home health to check the patient's INR on 10/04/2014 and 10/07/2014. Results will be sent to the patient's cardiologist, Dr. 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 The case was discussed by Dr. Sheran Fava with Dr. Wilburn Cornelia from ENT who will follow up as outpatient for evaluation - Speech therapy  - follow-up with ENT in clinic   Dysphagia, likely secondary to large goiter. Got choked up on dysphagia 2 diet. - 7/21 MBS-->recommended dys 2 diet with thin liquid per speech  Hypotension, was likely secondary to intravascular volume depletion/dehydration and relative adrenal insufficiency. She was given IVF and started on stress dose steroids. - Off stress dose  steroids since 7/17 - resolved. Now intermittent soft blood pressures may be related to Cardizem and furosemide.  Acute DVT in RUE secondary to central  line - Central line removed on 7/15 - 7/22-start warfarin with heparin bridge  Elevated troponin, chest pain-free,  -EKG demonstrates no acute ST segment elevation or T-wave inversions. She did previously have some T-wave inversions in the lateral leads and some mild ST segment elevations in the anterior leads which are stable.  -Stable elevation due to CKD and effusion. - Cardiology evaluated and performed a stress test on 7/18: Negative  Diabetes mellitus type 2 with well controlled blood sugars,  - A1c 6.2 On 07/27/14 -CBGs well controlled-->d/c SSI and CBG checks - continue Tradjenta  Right-sided breast cancer in remission,  -no evidence of recurrence on recent PET scan--09/18/14 - Continue arimidex - Dr. Virgie Dad courtesy f/u appreciated.  Hyperlipidemia, stable, continue atorvastatin  CKD stage III-IV,  -Creatinine clearance less than 30  - Minimize nephrotoxicity and renally dose medications - Creatinine gradually improving over the last 5 days. - baseline creatinine 1.3-1.6  Hypercalcemia secondary to dehydration and possible thyroid abnormalities (nonparathyroid hypercalcemia), resolved.  - Vitamin D 54, PTHrp wnl, PTH < 10  Iron deficiency anemia superimposed on MGUS. - continue iron supplementation - B12, folate, and TSH wnl - Hemoglobin stable.  Moderate malnutrition - Nutrition  - supplements  Constipation  - Continue miralax - Bisacodyl suppository - Lactulose 30gm once  NSVT 1 on 7/17 - Continue monitoring on telemetry. Try to keep K >4 & magnesium >2.  Discharge Condition: stable  Disposition: home Follow-up Information    Follow up with [facility].   Contact information:   [address] [place] [address] [phone]       Follow up with [name], MD.   Specialty:  Cardiothoracic Surgery   Why:  office willl contact with appt for 3-4 weeks to see Dr Evaristo Bury  information:   [place] [address] [phone]       Follow up with Adrian Prows, MD.   Specialty:  Cardiology   Contact information:   [address] [place] [address] [phone]       Follow up with Maximino Greenland, MD In 1 week.   Specialty:  Internal Medicine   Contact information:   [address] [place] [address] [phone]       Follow up with SHOEMAKER, Priyal Musquiz, MD In 1 month.   Specialty:  Otolaryngology   Contact information:   [address] [place] [address] [phone]       Diet:heart healthy Wt Readings from Last 3 Encounters:  10/03/14 56.11 kg (123 lb 11.2 oz)  08/25/14 47.492 kg (104 lb 11.2 oz)  08/04/14 53.162 kg (117 lb 3.2 oz)    History of present illness:  81 y.o. female with a hx of goiter, HLD, afib on chronic anticoagulation, chronic CHF, HTN, DM2 on tradjenta, breast cancer in remission, MGUS, and hx of recurrent R sided pleural effusions who presents to the [facility] ED on 7/12 with worsening sob over the past several days. Pt is s/p thoracentesis 6/16 yielding 800cc. In ED, chest imaging demonstrated moderate R sided effusion. Of note, patient does have a known goiter. Family reported pt had been having more difficulty swallowing foods recently. Essentially, she has had a chronic goiter, part of which was removed in the 1970s (  external portion). It is large, intrathoracic and was probably the cause of her chronic right effusion which she tolerated for many years. She has had some marginal enlargement in the last few months which has led to dyspnea requiring thoracentesis, dysphonia, and dysphagia. Ultimately, she needs treatment for her goiter or she will need palliative measures such as pleurex. Options being explored are surgical removal, radiation. As per [facility], high risk for major surgery and hence plans for right Pleurx catheter placement on 7/20.  During hospitalization, the patient drained over 2.5 L. Home health was set up to assist patient in drainage of her catheter every 48 hours. The patient was seen by cardiology. Amiodarone was started for the patient's Atrial fibrillation as well as metoprolol tartrate. The patient spontaneously converted to sinus rhythm. Respiratory status improved after placement of Pleurx catheter.  She will need follow with Dr. Jerrell Belfast for her thyroid and dysphonia.  Consultants: [facility] Cardiology--[facility]  Discharge Exam: Filed Vitals:   10/03/14 0425  BP: 103/48  Pulse: 69  Temp: 98.3 F (36.8 C)  Resp: 18   Filed Vitals:   10/02/14 1313 10/02/14 1939 10/02/14 2106 10/03/14 0425  BP: 1'08/60 89/47 95/44 '$ 103/48  Pulse: 71  68 69  Temp: 98.3 F (36.8 C)  98.8 F (37.1 C) 98.3 F (36.8 C)  TempSrc: Oral  Oral Oral  Resp: '18  18 18  '$ Height:      Weight:    56.11 kg (123 lb 11.2 oz)  SpO2: 100%  100% 100%   General: A&O x 3, NAD, pleasant, cooperative Cardiovascular: RRR, no rub, no gallop, no S3 Respiratory: bibasilar crackles, no wheeze Abdomen:soft, nontender, nondistended, positive bowel sounds Extremities: 1+LE edema, No lymphangitis, no petechiae  Discharge Instructions      Discharge Instructions    Diet - low sodium heart healthy    Complete by:  As directed      Increase activity slowly    Complete by:  As directed             Medication List    STOP taking these medications        levofloxacin 750 MG tablet  Commonly known as:  LEVAQUIN     losartan 100 MG tablet  Commonly known as:  COZAAR     Rivaroxaban 15 MG Tabs tablet  Commonly known as:  XARELTO      TAKE these medications        acetaminophen 500 MG tablet  Commonly known as:  TYLENOL  Take 500 mg by mouth every 6 (six) hours as needed for moderate pain or headache.     amiodarone 200 MG tablet  Commonly known as:  PACERONE  Take 1 tablet (200 mg total) by mouth 2 (two) times  daily.     anastrozole 1 MG tablet  Commonly known as:  ARIMIDEX  TAKE 1 TABLET (1 MG TOTAL) BY MOUTH DAILY.     atorvastatin 10 MG tablet  Commonly known as:  LIPITOR  Take 1 tablet (10 mg total) by mouth daily at 6 PM.     feeding supplement (ENSURE ENLIVE) Liqd  Take 237 mLs by mouth 2 (two) times daily between meals.     ferrous sulfate 325 (65 FE) MG tablet  Take 1 tablet (325 mg total) by mouth 3 (three) times daily with meals.     furosemide 40 MG tablet  Commonly known as:  LASIX  Take 40 mg by mouth daily as needed for fluid  or edema.     metoprolol tartrate 25 MG tablet  Commonly known as:  LOPRESSOR  Take 0.5 tablets (12.5 mg total) by mouth 2 (two) times daily.     multivitamin with minerals Tabs tablet  Take 1 tablet by mouth every other day.     TRADJENTA 5 MG Tabs tablet  Generic drug:  linagliptin  Take 5 mg by mouth daily.     warfarin 2.5 MG tablet  Commonly known as:  COUMADIN  Take 1 tablet (2.5 mg total) by mouth daily. Start 10/05/14  Start taking on:  10/05/2014         The results of significant diagnostics from this hospitalization (including imaging, microbiology, ancillary and laboratory) are listed below for reference.    Significant Diagnostic Studies: Dg Chest 1 View  09/19/2014   CLINICAL DATA:  Status post right thoracentesis  EXAM: CHEST  1 VIEW  COMPARISON:  09/19/2014  FINDINGS: Negative for pneumothorax following right thoracentesis. Trace right effusion remains. Improved right middle and lower lobe aeration with some residual atelectasis. Large mediastinal mass along the right peritracheal region compatible with a known massive thyroid goiter. Left lung remains clear. Stable cardiomegaly and vascularity. No superimposed edema.  IMPRESSION: Negative for pneumothorax following right thoracentesis. Decrease in the right effusion. Otherwise stable exam.   Electronically Signed   By: Jerilynn Mages.  Shick M.D.   On: 09/19/2014 16:44   Dg Chest 2  View  09/27/2014   CLINICAL DATA:  Shortness of breath and cough  EXAM: CHEST  2 VIEW  COMPARISON:  Chest radiograph September 26, 2014; chest CT September 19, 2014  FINDINGS: Pleural effusions bilaterally are stable. There is patchy consolidation in the bases, also stable. No new parenchymal lung opacity identified. The large right-sided thyroid goiter remains with deviation of the lower thoracic trachea toward the left. The heart size is within normal limits. Pulmonary vascularity is within normal limits. There are surgical clips in both axillary regions as well as in the right peritracheal region, stable. There is stable degenerative change in the thoracic spine.  IMPRESSION: No no appreciable change from 1 day prior. Bilateral effusions with bibasilar patchy airspace consolidation remain. This consolidation is felt to be largely due to atelectasis, although superimposed pneumonia must be of concern. No change in cardiac silhouette. Large right lobe thyroid goiter remains, stable. Tracheal deviation is likewise unchanged.   Electronically Signed   By: [name] III M.D.   On: 09/27/2014 07:35   Dg Chest 2 View  09/26/2014   CLINICAL DATA:  81 year old female with pleural effusion  EXAM: CHEST  2 VIEW  COMPARISON:  Chest radiograph dated 09/24/2014 and CT dated 09/19/2014  FINDINGS: Two views of the chest demonstrate emphysematous changes. There are moderate right and small left pleural effusion. There is associated partial compressive atelectasis of the right lower lobe. Superimposed pneumonia is not excluded. There is no pneumothorax. Large right thyroid mass again noted with mass effect and mild deviation of trachea to the left. There is degenerative changes of the spine.  IMPRESSION: No significant interval change in size of the bilateral pleural effusions.  Large right thyroid nodule.   Electronically Signed   By: Anner Crete M.D.   On: 09/26/2014 21:54   Dg Chest 2 View  09/19/2014   CLINICAL DATA:   Increasing shortness of breath. Wheezing. Onset of symptoms last night.  EXAM: CHEST  2 VIEW  COMPARISON:  CT 09/18/2014.  FINDINGS: RIGHT paratracheal mass compatible with substernal goiter.  Moderate RIGHT pleural effusion with compressive atelectasis. These findings appear similar to the CT yesterday. Aortic arch atherosclerosis. Axillary surgical clips bilaterally. Monitoring leads project over the chest. Cardiopericardial silhouette appears unchanged compared to prior chest radiographs. Lung volumes are lower than on previous study 08/24/2014.  IMPRESSION: 1. Moderate RIGHT pleural effusion with compressive/relaxation atelectasis of the RIGHT lower lobe and RIGHT middle lobe. 2. Large RIGHT peritracheal mass compatible with substernal goiter evaluated on prior CT. 3. Lower lung volumes than on prior chest radiographs.   Electronically Signed   By: Dereck Ligas M.D.   On: 09/19/2014 08:11   Ct Soft Tissue Neck Wo Contrast  09/21/2014   CLINICAL DATA:  81 year old female with difficulty swallowing. Initial encounter. Right pleural effusion treated with thoracentesis 2 days ago. History of thyroid goiter. History of treated breast cancer in 2014.  EXAM: CT NECK WITHOUT CONTRAST  TECHNIQUE: Multidetector CT imaging of the neck was performed following the standard protocol without intravenous contrast.  COMPARISON:  PET-CT 09/18/2014, 10/28/2013.  Chest CT 10/28/2013.  FINDINGS: Chronic massive right thyroid lobe enlargement, with the vast majority of the enlarged right lobe occupying the mediastinum and right hemithorax.  Mediastinal involvement by right thyroid goiter seen as far back as 2004 (with about 7 cm of right thyroid lobe in the mediastinum at that time).  Currently the right lobe extends as far caudally as the right mainstem bronchus and encompasses approximately 8.0 by 9.8 x 15.0 cm (AP by transverse by CC), with an estimated volume of 588 mL. Compare that to a normal splenic volume of no larger  than 412 mL.  Chronic heterogeneous density throughout the enlarged lobe. The left thyroid lobe is also chronically but more modestly enlarged encompassing about 4.3 x 3.1 x 6.4 cm (AP by transverse by CC). Despite the marked thyroid enlargement there is no significant mass effect on the trachea, carina, or right mainstem.  Asymmetry of the larynx suggests right vocal cord paralysis. No laryngeal or pharyngeal soft tissue mass is evident. Negative parapharyngeal, retropharyngeal, and sublingual spaces. Negative non contrast submandibular and parotid glands. No cervical lymphadenopathy identified in the absence of contrast. Negative visualized noncontrast brain parenchyma. Postoperative changes to the globes.  Chronic sphenoid sinus periosteal thickening, but Visualized paranasal sinuses and mastoids are clear. Absent dentition. No acute or suspicious osseous lesion identified. Small sclerotic focus in the right manubrium is stable since 2004.  Right IJ approach central line in place.  Layering moderate to large right pleural effusion and smaller left pleural effusion. Compressive atelectasis in both lungs greater on the right.  IMPRESSION: 1. Massive chronic enlargement of the right thyroid lobe, with the vast majority of the enlarged lobe occupying the mediastinum and right hemithorax as far down as the right mainstem bronchus. See details above. 2. Despite this, no significant mass effect on the airway or trachea. 3. Layering moderate to large right and smaller left pleural effusions with right greater than left pulmonary compressive atelectasis. 4. Suggestion of right vocal cord paralysis. No laryngeal/pharyngeal mass or cervical lymphadenopathy identified in the absence of contrast.   Electronically Signed   By: Genevie Ann M.D.   On: 09/21/2014 10:06   Ct Chest Wo Contrast  09/19/2014   CLINICAL DATA:  Right breast cancer. Increasing shortness of breath.  EXAM: CT CHEST WITHOUT CONTRAST  TECHNIQUE: Multidetector  CT imaging of the chest was performed following the standard protocol without IV contrast.  COMPARISON:  Multiple exams, including 09/18/2014 and 09/19/2014  FINDINGS: Mediastinum/Nodes:  Prominent thyroid goiter with massively enlarged right lobe extending into the right hemithorax and measuring approximately 13.9 by 8.0 by 7.1 cm.  Diffuse subcutaneous edema. Aortic arch and branch vessel atherosclerosis. Low-level stranding in the mediastinum with small to moderate pericardial effusion.  Lungs/Pleura: Right pleural effusion occupies greater than 50% of right hemithoracic volume and is associated with passive atelectasis.  Centrilobular emphysema. Old granulomatous disease. Trace left pleural effusion.  Upper abdomen: Stranding in the intra-abdominal adipose tissue.  Musculoskeletal: Thoracic spondylosis.  IMPRESSION: 1. Considerable third spacing of fluid with subcutaneous, mediastinal, and upper abdominal infiltrative edema. 2. Large right and trace left pleural effusions. 3. Very large thyroid goiter, with masslike extension of the right lobe into the right hemithorax as detailed on prior exams. 4. Centrilobular emphysema.   Electronically Signed   By: Van Clines M.D.   On: 09/19/2014 09:57   Nm Myocar Multi W/spect W/wall Motion / Ef  09/25/2014    Blood pressure demonstrated a hypertensive response to exercise.  Horizontal ST segment depression ST segment depression was noted during  stress in the II, III and aVF leads, beginning at 1 minutes of stress,  ending at 6 minutes of stress, and returning to baseline after 5-9 minutes  of recovery.   CLINICAL DATA:  81 year old female with a history of chest pain.  Cardiovascular risk factors include diabetes, hyperlipidemia.  EXAM: MYOCARDIAL IMAGING WITH SPECT (REST AND PHARMACOLOGIC-STRESS)  GATED LEFT VENTRICULAR WALL MOTION STUDY  LEFT VENTRICULAR EJECTION FRACTION  TECHNIQUE: Standard myocardial SPECT imaging was performed after resting intravenous  injection of 10 mCi Tc-18msestamibi. Subsequently, intravenous infusion of Lexiscan was performed under the supervision of the Cardiology staff. At peak effect of the drug, 30 mCi Tc-941mestamibi was injected intravenously and standard myocardial SPECT imaging was performed. Quantitative gated imaging was also performed to evaluate left ventricular wall motion, and estimate left ventricular ejection fraction.  COMPARISON:  None.  FINDINGS: Perfusion: Small fixed defect at the inferior lateral wall on rest and perfusion images. No reversible ischemia identified.  Wall Motion: Normal left ventricular wall motion. No left ventricular dilation.  Left Ventricular Ejection Fraction: 62 %  End diastolic volume 71 ml  End systolic volume 27 ml  IMPRESSION: 1. Small fixed defect at the inferior lateral wall. No reversible ischemia identified.  2. Normal left ventricular wall motion.  3. Left ventricular ejection fraction 62%  4. Low-risk stress test findings*.  *2012 Appropriate Use Criteria for Coronary Revascularization Focused Update: J Am Coll Cardiol. 205176;16(0):737-106http://content.onairportbarriers.comspx?articleid=1201161   Electronically Signed   By: JaCorrie Mckusick.O.   On: 09/25/2014 14:24    09/25/2014   CLINICAL DATA:  [age]ear old female with a history of chest pain.  Cardiovascular risk factors include diabetes, hyperlipidemia.  EXAM: MYOCARDIAL IMAGING WITH SPECT (REST AND PHARMACOLOGIC-STRESS)  GATED LEFT VENTRICULAR WALL MOTION STUDY  LEFT VENTRICULAR EJECTION FRACTION  TECHNIQUE: Standard myocardial SPECT imaging was performed after resting intravenous injection of 10 mCi Tc-9933mstamibi. Subsequently, intravenous infusion of Lexiscan was performed under the supervision of the Cardiology staff. At peak effect of the drug, 30 mCi Tc-74m9mtamibi was injected intravenously and standard myocardial SPECT imaging was performed. Quantitative gated imaging was also performed to evaluate left ventricular  wall motion, and estimate left ventricular ejection fraction.  COMPARISON:  None.  FINDINGS: Perfusion: Small fixed defect at the inferior lateral wall on rest and perfusion images. No reversible ischemia identified.  Wall Motion: Normal left ventricular wall motion. No left ventricular dilation.  Left Ventricular Ejection Fraction: 62 %  End diastolic volume 71 ml  End systolic volume 27 ml  IMPRESSION: 1. Small fixed defect at the inferior lateral wall. No reversible ischemia identified.  2. Normal left ventricular wall motion.  3. Left ventricular ejection fraction 62%  4. Low-risk stress test findings*.  *2012 Appropriate Use Criteria for Coronary Revascularization Focused Update: J Am Coll Cardiol. 2951;88(4):166-063. http://content.airportbarriers.com.aspx?articleid=1201161   Electronically Signed   By: Corrie Mckusick D.O.   On: 09/25/2014 14:24   Nm Pet Image Restag (ps) Skull Base To Thigh  09/18/2014   CLINICAL DATA:  Subsequent treatment strategy for breast carcinoma.  EXAM: NUCLEAR MEDICINE PET SKULL BASE TO THIGH  TECHNIQUE: 5.0 mCi F-18 FDG was injected intravenously. Full-ring PET imaging was performed from the skull base to thigh after the radiotracer. CT data was obtained and used for attenuation correction and anatomic localization.  FASTING BLOOD GLUCOSE:  Value: 94 mg/dl  COMPARISON:  By 10/28/2013 pleura PET-CT  FINDINGS: NECK  The thyroid gland is massively enlarged with a large portion extending into the right hemi thorax. There is variable metabolic activity scattered throughout this enlarged gland which is to similar prior.  CHEST  There is a moderate-sized right pleural effusion. No hypermetabolic mediastinal lymph nodes. No hypermetabolic axillary nodes. No hypermetabolic pulmonary nodules.  ABDOMEN/PELVIS  No abnormal hypermetabolic activity within the liver, pancreas, adrenal glands, or spleen. No hypermetabolic lymph nodes in the abdomen or pelvis.  SKELETON  No focal hypermetabolic  activity to suggest skeletal metastasis.  IMPRESSION: 1. No evidence of breast cancer recurrence or metastasis. 2. Moderate to large right pleural effusion. 3. Small free fluid in the pelvis. 4. Massive thyroid goiter extending into the right hemi thorax.   Electronically Signed   By: Suzy Bouchard M.D.   On: 09/18/2014 13:32   Dg Chest Port 1 View  09/28/2014   CLINICAL DATA:  Check chest tube placement  EXAM: PORTABLE CHEST - 1 VIEW  COMPARISON:  09/27/2014  FINDINGS: Cardiac shadow is mildly enlarged but stable. A PleurX catheter is again noted on the right in stable position. Small apical right pneumothorax is not again seen. Large right superior mediastinal mass (goiter) is noted consistent with prior exams. Small pleural effusions are noted bilaterally and stable. No new focal infiltrate is seen. No acute bony abnormality is noted.  IMPRESSION: Stable appearance when compare with the prior exam. Minimal apical pneumothorax remains.   Electronically Signed   By: Inez Catalina M.D.   On: 09/28/2014 07:35   Dg Chest Port 1 View  09/27/2014   CLINICAL DATA:  PleurX catheter insertion.  Right pleural effusion.  EXAM: PORTABLE CHEST - 1 VIEW 12:20 p.m.  COMPARISON:  Chest x-rays dated 09/27/2014 at 6:28 a.m., 09/24/2014 and 09/19/2014  FINDINGS: Right PleurX catheter has been inserted and the right pleural effusion has diminished. Tiny right apical pneumothorax.  Increased left pleural effusion. Heart size and vascularity are normal. Large spur right substernal goiter is unchanged.  IMPRESSION: PleurX catheter appears in good position. Significant decrease in right pleural effusion. Tiny right apical pneumothorax.  Increased small left effusion.   Electronically Signed   By: Lorriane Shire M.D.   On: 09/27/2014 12:40   Dg Chest Port 1 View  09/24/2014   CLINICAL DATA:  Shortness of breath.  Pleural effusion.  EXAM: PORTABLE CHEST - 1 VIEW  COMPARISON:  Multiple prior studies including 09/22/2014   FINDINGS: Right IJ central line has been removed. Heart is enlarged.  Bilateral pleural effusions again noted. There is right basilar opacity, stable in appearance and consistent with atelectasis or infiltrate. Left lower lung opacity obscures the hemidiaphragm and is consistent with atelectasis or infiltrate. Right mediastinal mass again noted and stable.  IMPRESSION: Persistent bilateral lower lobe opacities and pleural effusions.  Removal of right IJ central line.   Electronically Signed   By: Nolon Nations M.D.   On: 09/24/2014 08:48   Dg Chest Port 1 View  09/22/2014   CLINICAL DATA:  Right pleural effusion and shortness of breath. Status post right thoracentesis on 09/19/2014. History of thyroid goiter and breast carcinoma.  EXAM: PORTABLE CHEST - 1 VIEW  COMPARISON:  09/21/2014 and CT of the chest on 09/19/2014.  FINDINGS: Since thoracentesis, there is slight reaccumulation of right pleural fluid with a relatively small effusion identified by chest x-ray. There likely is a small left pleural effusion. Right lower lung atelectasis versus infiltrate present. Stable appearance of jugular central line and large right-sided mediastinal thyroid goiter.  IMPRESSION: Slight reaccumulation of right pleural fluid with relatively small effusion present. There likely is a small left pleural effusion. Right lower lung atelectasis versus infiltrate.   Electronically Signed   By: Aletta Edouard M.D.   On: 09/22/2014 10:41   Dg Chest Port 1 View  09/21/2014   CLINICAL DATA:  Dyspnea. Pleural effusions. Large substernal goiter.  EXAM: PORTABLE CHEST - 1 VIEW  COMPARISON:  09/20/2014  FINDINGS: Central venous catheter is unchanged with the tip in the superior vena cava above the cavoatrial junction. Again noted is the large right-sided substernal goiter extending into the right hemi thorax. There is new  There is new slight interstitial pulmonary edema. Bilateral pleural effusions have slightly increased. Cardiomegaly  is unchanged. Pulmonary vascularity appears slightly less prominent.  IMPRESSION: New interstitial edema.  Increasing effusions.   Electronically Signed   By: Lorriane Shire M.D.   On: 09/21/2014 07:03   Dg Chest Port 1 View  09/21/2014   CLINICAL DATA:  Central line placement  EXAM: PORTABLE CHEST - 1 VIEW  COMPARISON:  09/20/2014  FINDINGS: Interval placement of a right central venous catheter. Tip is projected over the low SVC region. The course of the catheter is somewhat lateral than would be expected but this is likely due to vascular displacement by a large right peritracheal mass lesion. Small amount of subcutaneous emphysema in the right neck. Persistent cardiac enlargement with mild pulmonary vascular congestion. Bilateral pleural effusions with basilar atelectasis or infiltration bilaterally. Surgical clips in the axilla bilaterally. Calcification of the aorta. No pneumothorax.  IMPRESSION: Right central venous catheter appears to be in adequate position. Persistent large right peritracheal mass lesion. Cardiac enlargement with pulmonary vascular congestion and bilateral pleural effusions with basilar infiltration or atelectasis.   Electronically Signed   By: Lucienne Capers M.D.   On: 09/21/2014 00:30   Dg Chest Port 1 View  09/20/2014   CLINICAL DATA:  81 year old female status post right thoracentesis  EXAM: PORTABLE CHEST - 1 VIEW  COMPARISON:  Prior chest x-ray 09/19/2014  FINDINGS: Stable cardiomegaly. Atherosclerotic calcification again noted in the transverse aorta. Similar appearance of large right paratracheal mass. There may be slight interval reaccumulation of the right-sided pleural effusion. New patchy opacity in the right lung base. Inspiratory volumes are slightly lower. The left lung remains relatively clear. Surgical changes suggest prior bilateral mastectomies. No acute osseous abnormality.  IMPRESSION: 1. Interval development of patchy airspace opacity in the right lung base  which  may represent atelectasis, infiltrate or aspiration in the appropriate clinical setting. 2. Perhaps trace interval reaccumulation of right pleural fluid. 3. Stable right paratracheal mass.   Electronically Signed   By: Jacqulynn Cadet M.D.   On: 09/20/2014 14:45   Dg Swallowing Func-speech Pathology  09/28/2014    Objective Swallowing Evaluation:    Patient Details  Name: Alison Gross MRN: [id number] Date of Birth: [birth year]  Today's Date: 09/28/2014 Time: SLP Start Time (ACUTE ONLY): 0915-SLP Stop Time (ACUTE ONLY): 3267 SLP Time Calculation (min) (ACUTE ONLY): 12 min  Past Medical History:  Past Medical History  Diagnosis Date  . Hypercholesterolemia     takes Crestor daily  . Thyroid disease     had iodine radiation  . Hypertension     takes Hyzaar daily  . Dysrhythmia     takes Carvedilol daily  . Pneumonia     history of;last time about 4-84yr ago  . GERD (gastroesophageal reflux disease)     takes Protonix daily  . Gastric ulcer   . History of blood transfusion   . History of colon polyps   . Anemia     takes iron pill daily  . Diabetes mellitus without complication     takes Tradjenta daily  . History of radiation therapy 07/12/12-08/26/12    right breast/  . Paroxysmal atrial fibrillation     PCP EKG 09/27/2013: A. Fibrillation.. EKG 09/29/2013: S. Tach.  . Breast cancer 04/12/12    right-pos lymph node/left-DCIS  . Shortness of breath on exertion   . Bruit of left carotid artery   . Acute upper GI bleeding 01/24/2012  . MGUS (monoclonal gammopathy of unknown significance) 04/21/2013  . Osteoporosis 11/29/2013  . Recurrent right pleural effusion 07/26/2014  . Stage III chronic kidney disease 07/26/2014  . Goiter 07/27/2014  . Substernal goiter 12/23/2012  . Pleural effusion, right 12/23/2012  . Type 2 diabetes mellitus with renal manifestations 07/26/2014  . Dysphonia 09/20/2014  . Vocal cord paralysis     Suspected due to massive substernal goiter   Past Surgical History:  Past Surgical History  Procedure  Laterality Date  . Carpal tunnel release      left  . Breast biopsy    . Esophagogastroduodenoscopy  01/24/2012    Procedure: ESOPHAGOGASTRODUODENOSCOPY (EGD);  Surgeon: MJeryl Columbia MD;   [facility];  Service: Endoscopy;  Laterality: N/A;  . Thyroid goiter removed    . Cyst removed from back of neck    . Knee surgery      right with rods  . Esophagogastroduodenoscopy    . Colonoscopy    . Total mastectomy Bilateral 05/10/2012    Procedure: RIGHT modified mastectomy; LEFT total mastectomy;  Surgeon:  CHaywood Lasso MD;  [facility]  Service: General;  Laterality:  Bilateral;  . Axillary sentinel node biopsy Right 05/10/2012    Procedure: AXILLARY SENTINEL lymph  NODE BIOPSY;  Surgeon: CHaywood Lasso MD;  [facility]  Service: General;  Laterality: Right;   nuclear medicine injection right side  7:00 am   . Abdominal hysterectomy      fibroids, with bilateral SO   HPI:  Other Pertinent Information: 81yo female with hx of breast cancer, right  nodule goiter, HLD, afib on chronic anticoagulation, HTN, DM2 on tradjenta  and hx of recurrent R sided pleural effusions who presented to the ED with  subjectively worsening sob over the past several days per MD note. Pt is  s/p thoracentesis 6/16 yielding 800cc of fluid and thoracentesis during  this admission.  Chest imaging demonstrated moderate R sided effusion. Pt  remained without hypoxia or tachypnea. Swallow evaluation ordered.  Pt  reports dysphagia with food/drink over the last few months.  Has some  chronic dysphagia due to goiter and voice changes after goiter surgery  approximately 40 years ago.  Pt admits to weight loss but denies dysphagia  being source. Pt reports frequently choking on bulky items as well as with  liquids.   Pt has never had swallow evaluation completed.  CXR showed  development of right lobe infiltrate, ? aspiraton.    No Data Recorded  Assessment / Plan / Recommendation CHL IP CLINICAL IMPRESSIONS 09/28/2014   Therapy Diagnosis (None)  Clinical Impression Patient with mild oral and moderate pharyngeal motor  based dysphagia, characterized by decreased base of tongue strength,  delayed swallow initiation, hyloaryngeal excursion and boney prominences  from her cervical spine that appear to decrease pharyngeal space.  These  impairments result in penetration and aspiration across consistencies as  well as moderate pharyngeal residue.  Chin tuck posture with small boluses  and use of hard swallows effectively protected airway and prevented  pharyngeal residue.  Recommend initiation of a Dys.2 texture diet with  thin liquids and strict use of aspiration precautions.  SLP will to  continue to follow.        CHL IP TREATMENT RECOMMENDATION 09/28/2014  Treatment Recommendations Therapy as outlined in treatment plan below     CHL IP DIET RECOMMENDATION 09/28/2014  SLP Diet Recommendations Dysphagia 2 (Fine chop);Thin  Liquid Administration via (None)  Medication Administration Whole meds with puree  Compensations Slow rate;Small sips/bites;Follow solids with liquid;Chin  tuck;Effortful swallow  Postural Changes and/or Swallow Maneuvers (None)     CHL IP OTHER RECOMMENDATIONS 09/28/2014  Recommended Consults (None)  Oral Care Recommendations Oral care BID  Other Recommendations (None)     CHL IP FOLLOW UP RECOMMENDATIONS 09/26/2014  Follow up Recommendations 24 hour supervision/assistance;Home health SLP     CHL IP FREQUENCY AND DURATION 09/28/2014  Speech Therapy Frequency (ACUTE ONLY) min 2x/week  Treatment Duration 1 week     Pertinent Vitals/Pain None    SLP Swallow Goals No flowsheet data found.  No flowsheet data found.    CHL IP REASON FOR REFERRAL 09/28/2014  Reason for Referral Objectively evaluate swallowing function     CHL IP ORAL PHASE 09/28/2014  Lips (None)  Tongue (None)  Mucous membranes (None)  Nutritional status (None)  Other (None)  Oxygen therapy (None)  Oral Phase WFL  Oral - Pudding Teaspoon (None)  Oral - Pudding  Cup (None)  Oral - Honey Teaspoon (None)  Oral - Honey Cup (None)  Oral - Honey Syringe (None)  Oral - Nectar Teaspoon (None)  Oral - Nectar Cup (None)  Oral - Nectar Straw (None)  Oral - Nectar Syringe (None)  Oral - Ice Chips (None)  Oral - Thin Teaspoon (None)  Oral - Thin Cup (None)  Oral - Thin Straw (None)  Oral - Thin Syringe (None)  Oral - Puree (None)  Oral - Mechanical Soft (None)  Oral - Regular (None)  Oral - Multi-consistency (None)  Oral - Pill (None)  Oral Phase - Comment (None)      CHL IP PHARYNGEAL PHASE 09/28/2014  Pharyngeal Phase Impaired  Pharyngeal - Pudding Teaspoon (None)  Penetration/Aspiration details (pudding teaspoon) (None)  Pharyngeal - Pudding Cup (None)  Penetration/Aspiration details (pudding cup) (None)  Pharyngeal - Honey Teaspoon (None)  Penetration/Aspiration details (honey teaspoon) (None)  Pharyngeal - Honey Cup (None)  Penetration/Aspiration details (honey cup) (None)  Pharyngeal - Honey Syringe (None)  Penetration/Aspiration details (honey syringe) (None)  Pharyngeal - Nectar Teaspoon (None)  Penetration/Aspiration details (nectar teaspoon) (None)  Pharyngeal - Nectar Cup (None)  Penetration/Aspiration details (nectar cup) (None)  Pharyngeal - Nectar Straw (None)  Penetration/Aspiration details (nectar straw) (None)  Pharyngeal - Nectar Syringe (None)  Penetration/Aspiration details (nectar syringe) (None)  Pharyngeal - Ice Chips (None)  Penetration/Aspiration details (ice chips) (None)  Pharyngeal - Thin Teaspoon (None)  Penetration/Aspiration details (thin teaspoon) (None)  Pharyngeal - Thin Cup (None)  Penetration/Aspiration details (thin cup) (None)  Pharyngeal - Thin Straw (None)  Penetration/Aspiration details (thin straw) (None)  Pharyngeal - Thin Syringe (None)  Penetration/Aspiration details (thin syringe') (None)  Pharyngeal - Puree (None)  Penetration/Aspiration details (puree) (None)  Pharyngeal - Mechanical Soft (None)  Penetration/Aspiration details (mechanical  soft) (None)  Pharyngeal - Regular (None)  Penetration/Aspiration details (regular) (None)  Pharyngeal - Multi-consistency (None)  Penetration/Aspiration details (multi-consistency) (None)  Pharyngeal - Pill (None)  Penetration/Aspiration details (pill) (None)  Pharyngeal Comment (None)      CHL IP CERVICAL ESOPHAGEAL PHASE 09/28/2014  Cervical Esophageal Phase WFL  Pudding Teaspoon (None)  Pudding Cup (None)  Honey Teaspoon (None)  Honey Cup (None)  Honey Straw (None)  Nectar Teaspoon (None)  Nectar Cup (None)  Nectar Straw (None)  Nectar Sippy Cup (None)  Thin Teaspoon (None)  Thin Cup (None)  Thin Straw (None)  Thin Sippy Cup (None)  Cervical Esophageal Comment (None)    CHL IP GO 09/20/2014  Functional Assessment Tool Used clinical judgement  Functional Limitations Swallowing  Swallow Current Status (E8315) CM  Swallow Goal Status (V7616) CL  Swallow Discharge Status (W7371) (None)  Motor Speech Current Status (G6269) (None)  Motor Speech Goal Status (S8546) (None)  Motor Speech Goal Status (E7035) (None)  Spoken Language Comprehension Current Status (K0938) (None)  Spoken Language Comprehension Goal Status (H8299) (None)  Spoken Language Comprehension Discharge Status (B7169) (None)  Spoken Language Expression Current Status (C7893) (None)  Spoken Language Expression Goal Status (Y1017) (None)  Spoken Language Expression Discharge Status (P1025) (None)  Attention Current Status (E5277) (None)  Attention Goal Status (O2423) (None)  Attention Discharge Status (N3614) (None)  Memory Current Status (E3154) (None)  Memory Goal Status (M0867) (None)  Memory Discharge Status (Y1950) (None)  Voice Current Status (D3267) (None)  Voice Goal Status (T2458) (None)  Voice Discharge Status (K9983) (None)  Other Speech-Language Pathology Functional Limitation [phone]) (None)  Other Speech-Language Pathology Functional Limitation Goal Status (N3976)  (None)  Other Speech-Language Pathology Functional Limitation Discharge Status   [phone]) (None)    Gunnar Fusi, M.A., CCC-SLP [phone]        [place] 09/28/2014, 11:11 AM    Dg C-arm 1-60 Min-no Report  09/27/2014   CLINICAL DATA: surgery   C-ARM 1-60 MINUTES  Fluoroscopy was utilized by the requesting physician.  No radiographic  interpretation.    US Thoracentesis Asp Pleural Space W/img Guide  09/19/2014   INDICATION: Breast cancer, dyspnea, recurrent right pleural effusion. Request is made for diagnostic and therapeutic right thoracentesis.  EXAM: ULTRASOUND GUIDED DIAGNOSTIC AND THERAPEUTIC RIGHT THORACENTESIS  COMPARISON:  Prior thoracentesis on 08/24/2014  MEDICATIONS: None  COMPLICATIONS: None immediate  TECHNIQUE: Informed written consent was obtained from the patient after a discussion of the risks, benefits and alternatives to treatment. A timeout was performed prior to the initiation  of the procedure.  Initial ultrasound scanning demonstrates a moderate to large right pleural effusion. The lower chest was prepped and draped in the usual sterile fashion. 1% lidocaine was used for local anesthesia.  An ultrasound image was saved for documentation purposes. A 6 Fr Safe-T-Centesis catheter was introduced. The thoracentesis was performed. The catheter was removed and a dressing was applied. The patient tolerated the procedure well without immediate post procedural complication. The patient was escorted to have an upright chest radiograph.  FINDINGS: A total of approximately 1.2 liters of hazy, amber fluid was removed. Requested samples were sent to the laboratory.  IMPRESSION: Successful ultrasound-guided diagnostic and therapeutic right sided thoracentesis yielding 1.2 liters of pleural fluid.  Read by: Rowe Robert, PA-C   Electronically Signed   By: Markus Daft M.D.   On: 09/19/2014 16:31     Microbiology: Recent Results (from the past 240 hour(s))  Body fluid culture     Status: None   Collection Time: 09/27/14 11:44 AM  Result Value Ref Range Status   Specimen  Description FLUID RIGHT PLEURAL  Final   Special Requests PART B  Final   Gram Stain   Final    WBC PRESENT,BOTH PMN AND MONONUCLEAR NO ORGANISMS SEEN CYTOSPIN SMEAR    Culture NO GROWTH 3 DAYS  Final   Report Status 09/30/2014 FINAL  Final     Labs: Basic Metabolic Panel:  Recent Labs Lab 09/27/14 0349 09/28/14 0238 09/29/14 0340 10/01/14 0335 10/03/14 0327  NA 140 141 141 136 138  K 3.8 4.0 3.6 3.8 4.0  CL 108 109 107 102 104  CO2 '23 24 25 28 27  '$ GLUCOSE 112* 130* 108* 111* 101*  BUN 22* 22* 21* 21* 21*  CREATININE 1.49* 1.50* 1.59* 1.54* 1.57*  CALCIUM 8.9 9.1 9.1 9.2 9.8   Liver Function Tests:  Recent Labs Lab 09/26/14 2219 09/29/14 0340  AST 21 16  ALT 16 12*  ALKPHOS 74 54  BILITOT 0.7 0.5  PROT 6.0* 5.7*  ALBUMIN 3.0* 2.9*   No results for input(s): LIPASE, AMYLASE in the last 168 hours. No results for input(s): AMMONIA in the last 168 hours. CBC:  Recent Labs Lab 09/29/14 0340 09/30/14 0338 10/01/14 0335 10/02/14 0341 10/03/14 0327  WBC 4.5 5.1 4.7 5.4 5.3  HGB 8.3* 8.3* 8.5* 8.4* 8.8*  HCT 25.7* 25.3* 26.2* 25.6* 26.9*  MCV 86.5 88.2 86.5 87.4 87.9  PLT 214 277 306 250 305   Cardiac Enzymes: No results for input(s): CKTOTAL, CKMB, CKMBINDEX, TROPONINI in the last 168 hours. BNP: Invalid input(s): POCBNP CBG:  Recent Labs Lab 09/30/14 1626 09/30/14 2121 10/01/14 0605 10/01/14 1123 10/01/14 1631  GLUCAP 83 108* 87 102* 108*    Time coordinating discharge:  Greater than 30 minutes  Signed:  Jaaliyah Lucatero, DO [facility] Pager: [phone] 10/03/2014, 9:38 AM

## 2014-10-03 NOTE — Progress Notes (Signed)
Ref: Maximino Greenland, MD   Subjective:  Sinus rhythm. Afebrile. Significant decrease in leg edema and walking some per patient.  Objective:  Vital Signs in the last 24 hours: Temp:  [98.3 F (36.8 C)-98.8 F (37.1 C)] 98.3 F (36.8 C) (07/26 0425) Pulse Rate:  [68-71] 69 (07/26 0425) Cardiac Rhythm:  [-] Normal sinus rhythm (07/25 2010) Resp:  [18] 18 (07/26 0425) BP: (89-111)/(44-60) 103/48 mmHg (07/26 0425) SpO2:  [100 %] 100 % (07/26 0425) Weight:  [56.11 kg (123 lb 11.2 oz)] 56.11 kg (123 lb 11.2 oz) (07/26 0425)  Physical Exam: BP Readings from Last 1 Encounters:  10/03/14 103/48    Wt Readings from Last 1 Encounters:  10/03/14 56.11 kg (123 lb 11.2 oz)    Weight change: -0.544 kg (-1 lb 3.2 oz)  HEENT: [place]/AT, Eyes-Brown, PERL, EOMI, Conjunctiva-Pale, Sclera-Non-icteric Neck: No JVD, No bruit, Trachea midline. Lungs:  Clear, Bilateral. Cardiac:  Regular rhythm, normal S1 and S2, no S3. II/VI systolic murmur. Abdomen:  Soft, non-tender. Extremities:  Trace edema present. No cyanosis. No clubbing. CNS: AxOx3, Cranial nerves grossly intact, moves all 4 extremities. Right handed. Skin: Warm and dry.   Intake/Output from previous day: 07/25 0701 - 07/26 0700 In: 960 [P.O.:960] Out: 2351 [Urine:1500; Stool:1; Chest Tube:850]    Lab Results: BMET    Component Value Date/Time   NA 138 10/03/2014 0327   NA 136 10/01/2014 0335   NA 141 09/29/2014 0340   NA 143 09/18/2014 0946   NA 142 08/04/2014 1344   NA 141 07/11/2014 1102   K 4.0 10/03/2014 0327   K 3.8 10/01/2014 0335   K 3.6 09/29/2014 0340   K 4.0 09/18/2014 0946   K 4.4 08/04/2014 1344   K 4.1 07/11/2014 1102   CL 104 10/03/2014 0327   CL 102 10/01/2014 0335   CL 107 09/29/2014 0340   CL 107 08/05/2012 1009   CL 108* 06/03/2012 1553   CL 105 04/21/2012 0806   CO2 27 10/03/2014 0327   CO2 28 10/01/2014 0335   CO2 25 09/29/2014 0340   CO2 30* 09/18/2014 0946   CO2 26 08/04/2014 1344   CO2 28  07/11/2014 1102   GLUCOSE 101* 10/03/2014 0327   GLUCOSE 111* 10/01/2014 0335   GLUCOSE 108* 09/29/2014 0340   GLUCOSE 94 09/18/2014 0946   GLUCOSE 108 08/04/2014 1344   GLUCOSE 119 07/11/2014 1102   GLUCOSE 128* 08/05/2012 1009   GLUCOSE 85 06/03/2012 1553   GLUCOSE 152* 04/21/2012 0806   BUN 21* 10/03/2014 0327   BUN 21* 10/01/2014 0335   BUN 21* 09/29/2014 0340   BUN 31.2* 09/18/2014 0946   BUN 45.5* 08/04/2014 1344   BUN 27.8* 07/11/2014 1102   CREATININE 1.57* 10/03/2014 0327   CREATININE 1.54* 10/01/2014 0335   CREATININE 1.59* 09/29/2014 0340   CREATININE 1.7* 09/18/2014 0946   CREATININE 1.8* 08/04/2014 1344   CREATININE 1.4* 07/11/2014 1102   CALCIUM 9.8 10/03/2014 0327   CALCIUM 9.2 10/01/2014 0335   CALCIUM 9.1 09/29/2014 0340   CALCIUM 11.8* 09/18/2014 0946   CALCIUM 11.6* 08/04/2014 1344   CALCIUM 10.7* 07/27/2014 0830   CALCIUM 11.3* 07/11/2014 1102   GFRNONAA 30* 10/03/2014 0327   GFRNONAA 31* 10/01/2014 0335   GFRNONAA 30* 09/29/2014 0340   GFRAA 35* 10/03/2014 0327   GFRAA 36* 10/01/2014 0335   GFRAA 35* 09/29/2014 0340   CBC    Component Value Date/Time   WBC 5.3 10/03/2014 0327   WBC 4.5 09/18/2014  0946   RBC 3.06* 10/03/2014 0327   RBC 3.34* 09/18/2014 0946   RBC 2.80* 01/24/2012 1011   HGB 8.8* 10/03/2014 0327   HGB 9.6* 09/18/2014 0946   HCT 26.9* 10/03/2014 0327   HCT 29.4* 09/18/2014 0946   PLT 305 10/03/2014 0327   PLT 319 09/18/2014 0946   MCV 87.9 10/03/2014 0327   MCV 87.9 09/18/2014 0946   MCH 28.8 10/03/2014 0327   MCH 28.7 09/18/2014 0946   MCHC 32.7 10/03/2014 0327   MCHC 32.7 09/18/2014 0946   RDW 16.5* 10/03/2014 0327   RDW 16.8* 09/18/2014 0946   LYMPHSABS 0.6* 09/19/2014 0750   LYMPHSABS 0.6* 09/18/2014 0946   MONOABS 0.8 09/19/2014 0750   MONOABS 0.6 09/18/2014 0946   EOSABS 0.5 09/19/2014 0750   EOSABS 0.3 09/18/2014 0946   BASOSABS 0.1 09/19/2014 0750   BASOSABS 0.1 09/18/2014 0946   HEPATIC Function  Panel  Recent Labs  09/20/14 1510 09/26/14 2219 09/29/14 0340  PROT 6.5 6.0* 5.7*   HEMOGLOBIN A1C No components found for: HGA1C,  MPG CARDIAC ENZYMES Lab Results  Component Value Date   TROPONINI 0.29* 09/21/2014   TROPONINI 0.30* 09/21/2014   TROPONINI 0.33* 09/20/2014   BNP No results for input(s): PROBNP in the last 8760 hours. TSH  Recent Labs  07/27/14 0515 08/23/14 0822  TSH 1.036 1.115   CHOLESTEROL No results for input(s): CHOL in the last 8760 hours.  Scheduled Meds: . amiodarone  200 mg Oral BID  . anastrozole  1 mg Oral Q breakfast  . atorvastatin  10 mg Oral q1800  . feeding supplement (ENSURE ENLIVE)  237 mL Oral BID BM  . ferrous sulfate  325 mg Oral TID WC  . linagliptin  5 mg Oral Daily  . metoprolol tartrate  12.5 mg Oral QID  . pantoprazole  40 mg Oral Daily  . polyethylene glycol  17 g Oral Daily  . warfarin   Does not apply Once  . Warfarin - Pharmacist Dosing Inpatient   Does not apply q1800   Continuous Infusions: . heparin 1,150 Units/hr (10/02/14 2311)   PRN Meds:.acetaminophen **OR** [DISCONTINUED] acetaminophen, albuterol, bisacodyl, morphine injection, [DISCONTINUED] ondansetron **OR** ondansetron (ZOFRAN) IV  Assessment/Plan: Resolving Diastolic heart failure, acute on chronic Paroxysmal Atrial fibrillation Right breast cancer in remission Hyperlipidemia Hypertension DM, II Recurrent pleural effusions Large substernal thyroid goiter Hypertrophic cardiomyopathy with mild LV outflow obstruction Quadricuspid AV without stenosis Dilated LA and RA Small Pericardial effusion Iron deficiency anemia Emphysema Hypoalbuminemia  Continue medical treatment.    LOS: 12 days    Dixie Dials  MD  10/03/2014, 8:23 AM

## 2014-10-04 ENCOUNTER — Other Ambulatory Visit: Payer: Self-pay | Admitting: *Deleted

## 2014-10-04 DIAGNOSIS — J90 Pleural effusion, not elsewhere classified: Secondary | ICD-10-CM | POA: Diagnosis not present

## 2014-10-04 NOTE — Patient Outreach (Signed)
Referral received from hospital liaison, A. Nevada Crane, for engagement of community care Freight forwarder and transition of care program.  Member discharged from hospital on yesterday, 7/26, after being treated for goiter and pleural effusion.  According to chart, member also has history of hypertension, atrial fibrillation, diabetes, and heart failure.    Call placed to member at number listed in chart [phone]) to initiate transition of care program.  No answer, HIPPA compliant voice message left.  Will await call back.  Will make another attempt to contact member later this week.    Valente David, BSN, [place] Management  [facility] Care Manager [phone]

## 2014-10-06 ENCOUNTER — Other Ambulatory Visit: Payer: Self-pay | Admitting: *Deleted

## 2014-10-06 DIAGNOSIS — I509 Heart failure, unspecified: Secondary | ICD-10-CM

## 2014-10-06 DIAGNOSIS — I10 Essential (primary) hypertension: Secondary | ICD-10-CM

## 2014-10-06 DIAGNOSIS — E119 Type 2 diabetes mellitus without complications: Secondary | ICD-10-CM

## 2014-10-06 NOTE — Addendum Note (Signed)
Addended byValente David on: 10/06/2014 07:37 PM   Modules accepted: Orders

## 2014-10-06 NOTE — Patient Outreach (Signed)
Call placed to member as second attempt to engage in [facility] care management services.  Member answers the phone but immediately places daughter, Alison Gross, on the phone.  Member's identity verified, this care manager introduced self and the purpose of the call.  Daughter states that the member has been doing well and will have home health with [place] starting later this morning.  She states that they will be monitoring and assessing the tube that is in place for the member's pleural effusion.  Daughter questions about obtaining supplies, made aware that the nurse form [facility] would be able to assist with that today when she visits.   Daughter states that the member has follow up appointments set up and does have transportation.  She states that the member lives with her husband and herself, the daughter, and that they are her support sytem.  She does question if there is any help available in the event that herself or her dad was not able to be home for a few hours with the member.  This care manager notified daughter that the social worker could be contacted to provide a list of resources.    Medication list reviewed, daughter states that the member does take all medications as prescribed.  She confirms that the member has started her coumadin yesterday.  Daughter expresses interest in getting all of the member's medications to generic in effort to make them more affordable.  She is also interested in getting the medications through mail order.  Will contact pharmacy for medication review and to address concerns.  Daughter states that she helps the member weigh daily.  Encouraged to keep a record of the weights.  She is able to verbalize the signs and symptoms of heart failure and when to contact the physician for the member.    This care manager explains the transition of care program, and requests to arrange for an initial home visit.  Daughter states that it would be best to contact her father, the  member's husband, to arrange a visit as she will be out of town next week.  She states that either herself or Alison Gross will be the contact persons for the member.  Both names are listed on the consent form.    The daughter at this time denies any further questions.  Contact information for this care manager provided to daughter.  Encouraged to contact with any concerns.  Alison Gross, BSN, [place] Management  [facility] Care Manager [phone]

## 2014-10-09 NOTE — Patient Outreach (Signed)
[place] [facility]) Care Management  10/09/2014  DELISA FINCK 1932-03-26 [id number]   Request from Valente David, RN to assigned Pharmacy and SW, assigned Deanne Coffer, PharmD and [employer], [place].  Ronnell Freshwater. [place], [place] Management [place] Assistant Phone: [phone] Fax: [phone]

## 2014-10-10 ENCOUNTER — Other Ambulatory Visit: Payer: Self-pay | Admitting: Medical Oncology

## 2014-10-10 ENCOUNTER — Other Ambulatory Visit: Payer: Self-pay | Admitting: Nurse Practitioner

## 2014-10-10 ENCOUNTER — Telehealth: Payer: Self-pay | Admitting: *Deleted

## 2014-10-10 ENCOUNTER — Telehealth: Payer: Self-pay | Admitting: Medical Oncology

## 2014-10-10 DIAGNOSIS — D0512 Intraductal carcinoma in situ of left breast: Secondary | ICD-10-CM

## 2014-10-10 NOTE — Telephone Encounter (Signed)
Needs refill . Per phamacy last refill was in may 2016.

## 2014-10-10 NOTE — Telephone Encounter (Signed)
VM message from patient '@2'$ :19 pm inquiring about her Anastrozole refill. [facility] and her [facility] is [facility] for pick up.  TC to patient at identified #. No answer but left VM for patient letting her know to pick up he medication.

## 2014-10-10 NOTE — Telephone Encounter (Signed)
[facility] [address] after message from patient that her Anastrozole requires authorization for refill.  "[facility] now per [employer] left with patient that she has enough refills on file to last through 01-18-2015.  Included instructions to request refills with current order number from recently filled bottles.

## 2014-10-10 NOTE — Progress Notes (Signed)
Note to [employer] about arimidex refills.

## 2014-10-11 ENCOUNTER — Other Ambulatory Visit: Payer: Self-pay | Admitting: Licensed Clinical Social Worker

## 2014-10-11 NOTE — Patient Outreach (Signed)
[facility]) Care Management  10/11/2014  Alison Gross Jan 23, 1933 [id number]   Assessment-CSW covering for [place] received referral for community support and resources. CSW attempted initial contact and was unsuccessful in reaching patient. CSW left HIPPA compliant voice message requesting call back once patient is available.  Plan-If patient does not return call, CSW will attempt second contact attempt on 10/13/14.  Eula Fried, BSW, MSW, [email or web address] Phone: [phone] Fax: [phone]

## 2014-10-13 ENCOUNTER — Other Ambulatory Visit: Payer: Self-pay | Admitting: *Deleted

## 2014-10-13 NOTE — Patient Outreach (Signed)
Weekly transition of care call placed to member's husband per daughter's request last week as daughter is out of town this week.  The daughter and husband are the primary caregivers for the member.    Husband answers the phone, member's identity confirmed.  This care manager introduced self and purpose of call.  Mr. Sonnen notified that this care manager made contact with his daughter on last week.  He states that the member has been progressing since her discharge last week.  He reports that she has a nurse from [place] to visit the home every Monday, Wednesday, and Friday to drain her pluerex catheter.  He reports 800 ml of fluid out today.  He voices concern that the amount of fluid is increasing instead of decreasing, stating that it has been between 600-757m.  He states that the home health nurse will also come to the home tomorrow to drain the catheter in effort to not have an increased amount of fluid on Monday.  He reports that the member has another follow up appointment on Monday, but does not see Dr. GServando Snareuntil 8/25.    Mr. JLahmanninquires about the services that [facility] offer.  [facility] explained, he requests that another call be made once his daughter is back in town.  This care manager offers to make a home visit to be able to speak face to face with the member, Mr. JBoschert and their daughter next week.  He agrees and schedules an appointment for next Wednesday.    Husband states that the member is showing no signs of discomfort at this time, denies any urgent needs.  This care manager provided Mr. JBrabsonwith contact information, encouraged to contact with any concerns.  MValente David BSN, PJonestownManagement  [facility]are Manager [phone]

## 2014-10-17 ENCOUNTER — Other Ambulatory Visit: Payer: Self-pay | Admitting: *Deleted

## 2014-10-17 ENCOUNTER — Other Ambulatory Visit: Payer: Commercial Managed Care - HMO | Admitting: *Deleted

## 2014-10-17 DIAGNOSIS — D0512 Intraductal carcinoma in situ of left breast: Secondary | ICD-10-CM

## 2014-10-17 MED ORDER — ANASTROZOLE 1 MG PO TABS: ORAL_TABLET | ORAL | Status: DC

## 2014-10-17 NOTE — Patient Outreach (Signed)
[facility]) Care Management  [place]   10/17/2014  Alison Gross 1932-08-07 [id number]  Subjective: Alison Gross is a 81 year old female who was referred to me by Alison David, RN, her nurse care manager in regards to assisting with medication costs.  Her daughter is interested in switching to generics or using mail order to save money on her medications.    Objective:   Current Medications: Current Outpatient Prescriptions  Medication Sig Dispense Refill  . acetaminophen (TYLENOL) 500 MG tablet Take 500 mg by mouth every 6 (six) hours as needed for moderate pain or headache.    Marland Kitchen amiodarone (PACERONE) 200 MG tablet Take 1 tablet (200 mg total) by mouth 2 (two) times daily. 60 tablet 0  . anastrozole (ARIMIDEX) 1 MG tablet TAKE 1 TABLET (1 MG TOTAL) BY MOUTH DAILY. (Patient taking differently: Take 1 mg by mouth daily with breakfast. ) 30 tablet 5  . atorvastatin (LIPITOR) 10 MG tablet Take 1 tablet (10 mg total) by mouth daily at 6 PM. 30 tablet 0  . feeding supplement, ENSURE ENLIVE, (ENSURE ENLIVE) LIQD Take 237 mLs by mouth 2 (two) times daily between meals. (Patient not taking: Reported on 10/06/2014) 237 mL 12  . ferrous sulfate 325 (65 FE) MG tablet Take 1 tablet (325 mg total) by mouth 3 (three) times daily with meals. 90 tablet 0  . furosemide (LASIX) 40 MG tablet Take 40 mg by mouth daily as needed for fluid or edema.   2  . linagliptin (TRADJENTA) 5 MG TABS tablet Take 5 mg by mouth daily.    . metoprolol tartrate (LOPRESSOR) 25 MG tablet Take 0.5 tablets (12.5 mg total) by mouth 2 (two) times daily. 60 tablet 0  . Multiple Vitamin (MULTIVITAMIN WITH MINERALS) TABS Take 1 tablet by mouth every other day.     . warfarin (COUMADIN) 2.5 MG tablet Take 1 tablet (2.5 mg total) by mouth daily. Start 10/05/14 7 tablet 0   No current facility-administered medications for this visit.    Functional Status: In your present state of health, do you have any  difficulty performing the following activities: 09/19/2014 09/19/2014  Hearing? N N  Vision? N N  Difficulty concentrating or making decisions? N N  Walking or climbing stairs? N N  Dressing or bathing? N N  Doing errands, shopping? N N    Fall/Depression Screening: No flowsheet data found.  Assessment:  1.  Lower Medication Costs:  The following medications could be filled through [employer] for a zero dollar co-pay:  Amiodarone, anastrozole, atorvastatin, furosemide, metoprolol, and warfarin. 2.  Drug interaction:  Amiodarone and warfarin:  Amiodarone will cause the INR to increase significantly when first started.  She recently started Amiodarone in the hospital.  She received warfarin 5 mg from 7/22-7/25.  On 7/26 her INR was 3.1, which was an significant increase from 1.7 the day before.  She was instructed to not start warfarin 2.5 mg daily until 7/28.  This dose is reflectively of a 50% decrease in warfarin which is recommended when used with Amiodarone.  Home health was to draw INRs on 7/29 and 7/30.   3.  Renal dosing of  Medications:  All current medications are renally dosed.    Plan:  1.  I will have Alison David, RN inform the daughter and husband that if they ordered the Amiodarone, anastrozole, atorvastatin, furosemide, metoprolol, and warfarin through [employer], the copay would be zero dollars.  2.  I will ask Alison Gross to follow up with them as well to make sure the home health agency is drawing INR levels and faxing to her provider who is monitoring her INR levels.   3.  I will close out the pharmacy program since there are no other pharmacy needs.  I will be happy to follow up on an as needed basis.    Alison Gross, PharmD, [place] [phone]

## 2014-10-18 ENCOUNTER — Other Ambulatory Visit: Payer: Self-pay | Admitting: *Deleted

## 2014-10-18 ENCOUNTER — Encounter: Payer: Self-pay | Admitting: *Deleted

## 2014-10-18 NOTE — Patient Outreach (Signed)
[facility]) Care Management   10/18/2014  Alison Gross 1932/04/18 [id number]  Alison Gross is an 81 y.o. female  Subjective:   Objective:   Review of Systems  Constitutional: Negative.   HENT: Negative.   Eyes: Negative.   Respiratory: Positive for shortness of breath.   Cardiovascular: Positive for leg swelling.  Gastrointestinal: Negative.   Genitourinary: Negative.   Musculoskeletal: Negative.   Skin: Negative.   Neurological: Negative.   Endo/Heme/Allergies: Negative.   Psychiatric/Behavioral: Negative.     Physical Exam  Constitutional: She is oriented to person, place, and time. She appears well-developed and well-nourished.  Neck: Normal range of motion.  Cardiovascular: Normal rate and regular rhythm.   Respiratory: Breath sounds normal.  GI: Soft. Bowel sounds are normal.  Musculoskeletal: Normal range of motion.  Neurological: She is alert and oriented to person, place, and time.  Skin: Skin is warm and dry.   BP 126/64 mmHg  Pulse 55  Resp 22  Ht 1.626 m (5' 4" )  Wt 118 lb (53.524 kg)  BMI 20.24 kg/m2  SpO2 98%  Current Medications:   Current Outpatient Prescriptions  Medication Sig Dispense Refill  . acetaminophen (TYLENOL) 500 MG tablet Take 500 mg by mouth every 6 (six) hours as needed for moderate pain or headache.    Marland Kitchen amiodarone (PACERONE) 200 MG tablet Take 1 tablet (200 mg total) by mouth 2 (two) times daily. 60 tablet 0  . anastrozole (ARIMIDEX) 1 MG tablet TAKE 1 TABLET (1 MG TOTAL) BY MOUTH DAILY. 30 tablet 5  . atorvastatin (LIPITOR) 10 MG tablet Take 1 tablet (10 mg total) by mouth daily at 6 PM. 30 tablet 0  . feeding supplement, ENSURE ENLIVE, (ENSURE ENLIVE) LIQD Take 237 mLs by mouth 2 (two) times daily between meals. (Patient not taking: Reported on 10/06/2014) 237 mL 12  . ferrous sulfate 325 (65 FE) MG tablet Take 1 tablet (325 mg total) by mouth 3 (three) times daily with meals. 90 tablet 0  . furosemide  (LASIX) 40 MG tablet Take 40 mg by mouth daily as needed for fluid or edema.   2  . linagliptin (TRADJENTA) 5 MG TABS tablet Take 5 mg by mouth daily.    . metoprolol tartrate (LOPRESSOR) 25 MG tablet Take 0.5 tablets (12.5 mg total) by mouth 2 (two) times daily. 60 tablet 0  . Multiple Vitamin (MULTIVITAMIN WITH MINERALS) TABS Take 1 tablet by mouth every other day.     . warfarin (COUMADIN) 2.5 MG tablet Take 1 tablet (2.5 mg total) by mouth daily. Start 10/05/14 7 tablet 0   No current facility-administered medications for this visit.    Functional Status:   In your present state of health, do you have any difficulty performing the following activities: 10/18/2014 09/19/2014  Hearing? N N  Vision? Y N  Difficulty concentrating or making decisions? N N  Walking or climbing stairs? Y N  Dressing or bathing? N N  Doing errands, shopping? Y N  Preparing Food and eating ? N -  Using the Toilet? N -  In the past six months, have you accidently leaked urine? N -  Do you have problems with loss of bowel control? N -  Managing your Medications? N -  Managing your Finances? N -  Housekeeping or managing your Housekeeping? N -    Fall/Depression Screening:    PHQ 2/9 Scores 10/18/2014  PHQ - 2 Score 0    Assessment:    Met  with member at scheduled time, husband present during visit.  Member states that she is doing "ok" this morning, however is having some difficulty breathing.  She states that she is able to feel when her pleurex catheter is in need of draining.  This is done every Monday, Wednesday, Friday, and Saturday.  [facility] nurse arrives prior to this care manager's departure and drains 812m from catheter.  She ([place]ison [place] with [place] states that she has placed a call to Dr. GIrven Shellingoffice to provide an update on the amount of fluid being removed from catheter.  Member has follow up appointment with Dr. GServando Snareon 8/25.    Member has some swelling in her  legs/ankles/feet.  She reports that the swelling goes away during the night, but comes back when she gets up and start to move around again.  She states that she has been attempting wear compression stockings, but reports that they are too tight.  Member also has Lasix order PRN.  She reports that she usually takes it when her weight has increased by a few pounds.  Although her weight has remained the same, she continues to have swelling.  Dr. SLynder Parents(PCP) office called for clear instructions on administration of Lasix.  Message left.  Dr. GIrven Shelling(cardiology) office also called for further instructions per the request of member's husband.  Spoke with VPasty Spillers who recommended member return current compression stockings and attempt to obtain some with less compression.  VPasty Spillersstates that she will consult the nurse practitioner regarding instructions for Lasix administration.  Husband speaks on member's weight loss over the past 7 months, stating that the goiter that the member has has been the cause (she was having difficulty swallowing) of the weight loss.  He reports that she has now picked up on her appetite and has remained steady with her weight.    Member recently started on Warfarin.  She states that the home health nurse checks her INR level every Monday and adjusts her does accordingly.  All other medications reviewed.  Member denies any concerns regarding medications.  Pill box provided for management.  Member states that she weighs herself daily, but does not always write it down.  Member encouraged to document weights daily.  Also encouraged to monitor blood pressure on a daily basis and record for better management of medications.  THN calendar provided, instructed to document in appropriate places in the charts.  Member and husband both deny any further concerns a this time.  Contact information provided, encourage to contact with any questions.  Plan:   Will await call back from Dr. GEinar Gip and/or Dr. SBaird Canceroffice to receive further instructions on Lasix administration. Will continue with transition of care calls next week. Routine home visit scheduled for next month.  [facility] Care Plan Problem One        Patient Outreach from 10/18/2014 in TW[place]landProblem One  Recent hospitalization   Care Plan for Problem One  Active   THN Long Term Goal (31-90 days)  Member will not be readmitted to hospital within the next 31 days   THN Long Term Goal Start Date  10/06/14   Interventions for Problem One Long Term Goal  Discussed with member the importance of following discharge instructions, including follow up appointments, medications, diet, and home health involvement, to decrease the risk of readmission   THN CM Short Term Goal #1 (0-30 days)  Member will take  medications as prescribed over the next 4 weeks   THN CM Short Term Goal #1 Start Date  10/06/14   Interventions for Short Term Goal #1  Discussed with member the importance of following discharge instructions, including follow up appointments, medications, diet, and home health involvement, to decrease the risk of readmission   THN CM Short Term Goal #2 (0-30 days)  Member will have follow up appointment with primary care provider within the next 4 weeks   THN CM Short Term Goal #2 Start Date  10/06/14   [facility] CM Short Term Goal #2 Met Date  10/18/14   Interventions for Short Term Goal #2  Discussed with member the importance of following discharge instructions, including follow up appointments, medications, diet, and home health involvement, to decrease the risk of readmission     Valente David, BSN, [place] Manager [phone]

## 2014-10-19 NOTE — Patient Outreach (Signed)
[facility]) Care Management  10/19/2014  RISSA TURLEY [birth year] [id number]  CSW received a new referral on patient from patient's RNCM with [place] Management, Valente David indicating that patient would benefit from social work services and resources.  More specifically, Mrs. Orene Desanctis reported that patient's husband, Natayla Cadenhead and daughter's, [name] and [name] are requesting in-home care services for patient .  Patient's family is requesting a list of resources to offer assistance to patient in the home while they need to be away. CSW made an initial attempt to try and contact patient today to perform phone assessment, but patient was unavailable.  A HIPAA compliant message was left for patient on voicemail.  CSW is currently awaiting a return call.   Nat Christen, BSW, MSW, [place] Management [place], [place], [place] [address] [name] [phone]

## 2014-10-21 ENCOUNTER — Other Ambulatory Visit: Payer: Self-pay | Admitting: *Deleted

## 2014-10-21 NOTE — Patient Outreach (Signed)
Outreach complete 10/18/14...Marland KitchenMarland KitchenMarland Kitchen  Call received back from [facility] at Dr. Irven Shelling office regarding Lasix administration.  Per Pasty Spillers, the office was waiting on labs to be drawn prior to providing more instructions about medication.  Pasty Spillers made aware that the home health nurse attempted on Monday to obtain labs with no success, and that she was to try again today. Pasty Spillers states to advise member to call physician office to obtain order to have labs drawn at the office instead of by home health nurse if the attempt today was unsuccessful.   Call placed to member's husband.  He states that the home health nurse was unable to obtain labs again today.  This care manager instructed him to place call to physician office to obtain orders for labs.  Husband called this care manager back within 15 minutes and states that the member will go and have labs done today.  This care manager made husband aware that they would receive further instructions on Lasix after the labs were resulted.  He verbalizes understanding.  Valente David, BSN, [place] Management  [facility] Care Manager [phone]

## 2014-10-23 ENCOUNTER — Other Ambulatory Visit: Payer: Self-pay | Admitting: *Deleted

## 2014-10-23 NOTE — Patient Outreach (Signed)
Alison Gross  10/23/2014  Alison Gross 1933/02/05 [id number]   CSW made a third attempt to try and contact patient today to perform phone assessment, as well as assess and assist with social work needs and services, without success.  A HIPAA compliant message was left on voicemail.  Awaiting a return call.  Nat Christen, BSW, MSW, [name], [place], [place] [address] [name] [phone]

## 2014-10-27 ENCOUNTER — Other Ambulatory Visit: Payer: Self-pay | Admitting: *Deleted

## 2014-10-27 NOTE — Patient Outreach (Signed)
Weekly transition of care call placed to member.  No answer.  As requested before by husband, he was called.  Mr. Difrancesco states that the member is doing fine and "looks to have a little more energy."  He reports that the home health nurse continues to come out the home to drain the member's tube, with 573m being drained today.  He states that the amount has begun to decrease since last Wednesday, when 8536mwas removed.  He reports that member had a visit with Dr. GaEinar Giphis week and that he has voiced some concern regarding the goiter.  Per Mr. JoHeatheringtonDr. GaEinar Gips in need of an operation to remove the goiter as an effort to improve the member's quality of life, and would like to possibly admit member into the hospital next week for further evaluation.  He also states that Dr.Ganji has discussed possibly looking for surgeons at [facility] well as [facility].  Mr. JoNguyentates that he requested information on [facility] to his home.  He states that Dr. GaIrven Shellingffice will call next week with more information.  Mr. JoStanhopeenies any concerns at this current time.  He states that the member continues to weigh self daily and take medications appropriately, and she will continue to have the home health nurse visit on Mon, Wed, Fri, and Sat.  Husband encouraged to contact this care manager with any questions.  MoValente DavidBSN, PCSaritaanagement  CoUpper Bay Surgery Center LLCare Manager [phone]

## 2014-10-30 ENCOUNTER — Inpatient Hospital Stay (HOSPITAL_COMMUNITY)
Admission: AD | Admit: 2014-10-30 | Discharge: 2014-11-05 | DRG: 186 | Disposition: A | Discharge status: Home Health | Payer: Commercial Managed Care - HMO | Source: Ambulatory Visit | Attending: Cardiology | Admitting: Cardiology

## 2014-10-30 ENCOUNTER — Other Ambulatory Visit: Payer: Self-pay | Admitting: *Deleted

## 2014-10-30 ENCOUNTER — Other Ambulatory Visit: Payer: Self-pay | Admitting: Cardiology

## 2014-10-30 DIAGNOSIS — Z79899 Other long term (current) drug therapy: Secondary | ICD-10-CM | POA: Diagnosis not present

## 2014-10-30 DIAGNOSIS — I129 Hypertensive chronic kidney disease with stage 1 through stage 4 chronic kidney disease, or unspecified chronic kidney disease: Secondary | ICD-10-CM | POA: Diagnosis present

## 2014-10-30 DIAGNOSIS — E049 Nontoxic goiter, unspecified: Secondary | ICD-10-CM | POA: Diagnosis present

## 2014-10-30 DIAGNOSIS — R627 Adult failure to thrive: Secondary | ICD-10-CM | POA: Diagnosis present

## 2014-10-30 DIAGNOSIS — E441 Mild protein-calorie malnutrition: Secondary | ICD-10-CM | POA: Diagnosis present

## 2014-10-30 DIAGNOSIS — R64 Cachexia: Secondary | ICD-10-CM | POA: Diagnosis present

## 2014-10-30 DIAGNOSIS — I480 Paroxysmal atrial fibrillation: Secondary | ICD-10-CM | POA: Diagnosis present

## 2014-10-30 DIAGNOSIS — M810 Age-related osteoporosis without current pathological fracture: Secondary | ICD-10-CM | POA: Diagnosis present

## 2014-10-30 DIAGNOSIS — E1122 Type 2 diabetes mellitus with diabetic chronic kidney disease: Secondary | ICD-10-CM | POA: Diagnosis present

## 2014-10-30 DIAGNOSIS — Z7901 Long term (current) use of anticoagulants: Secondary | ICD-10-CM | POA: Diagnosis not present

## 2014-10-30 DIAGNOSIS — E785 Hyperlipidemia, unspecified: Secondary | ICD-10-CM | POA: Diagnosis present

## 2014-10-30 DIAGNOSIS — E780 Pure hypercholesterolemia: Secondary | ICD-10-CM | POA: Diagnosis present

## 2014-10-30 DIAGNOSIS — Z853 Personal history of malignant neoplasm of breast: Secondary | ICD-10-CM

## 2014-10-30 DIAGNOSIS — Z87891 Personal history of nicotine dependence: Secondary | ICD-10-CM | POA: Diagnosis not present

## 2014-10-30 DIAGNOSIS — Z86718 Personal history of other venous thrombosis and embolism: Secondary | ICD-10-CM | POA: Diagnosis not present

## 2014-10-30 DIAGNOSIS — Z7951 Long term (current) use of inhaled steroids: Secondary | ICD-10-CM | POA: Diagnosis not present

## 2014-10-30 DIAGNOSIS — I5033 Acute on chronic diastolic (congestive) heart failure: Secondary | ICD-10-CM | POA: Diagnosis present

## 2014-10-30 DIAGNOSIS — Z6820 Body mass index (BMI) 20.0-20.9, adult: Secondary | ICD-10-CM | POA: Diagnosis not present

## 2014-10-30 DIAGNOSIS — Z923 Personal history of irradiation: Secondary | ICD-10-CM

## 2014-10-30 DIAGNOSIS — E876 Hypokalemia: Secondary | ICD-10-CM | POA: Diagnosis present

## 2014-10-30 DIAGNOSIS — J3801 Paralysis of vocal cords and larynx, unilateral: Secondary | ICD-10-CM | POA: Diagnosis present

## 2014-10-30 DIAGNOSIS — D509 Iron deficiency anemia, unspecified: Secondary | ICD-10-CM | POA: Diagnosis present

## 2014-10-30 DIAGNOSIS — D638 Anemia in other chronic diseases classified elsewhere: Secondary | ICD-10-CM | POA: Diagnosis present

## 2014-10-30 DIAGNOSIS — Z86010 Personal history of colonic polyps: Secondary | ICD-10-CM

## 2014-10-30 DIAGNOSIS — K219 Gastro-esophageal reflux disease without esophagitis: Secondary | ICD-10-CM | POA: Diagnosis present

## 2014-10-30 DIAGNOSIS — Z9013 Acquired absence of bilateral breasts and nipples: Secondary | ICD-10-CM | POA: Diagnosis present

## 2014-10-30 DIAGNOSIS — J90 Pleural effusion, not elsewhere classified: Principal | ICD-10-CM | POA: Diagnosis present

## 2014-10-30 DIAGNOSIS — Z9689 Presence of other specified functional implants: Secondary | ICD-10-CM

## 2014-10-30 DIAGNOSIS — R490 Dysphonia: Secondary | ICD-10-CM | POA: Diagnosis present

## 2014-10-30 DIAGNOSIS — N179 Acute kidney failure, unspecified: Secondary | ICD-10-CM | POA: Diagnosis present

## 2014-10-30 DIAGNOSIS — N184 Chronic kidney disease, stage 4 (severe): Secondary | ICD-10-CM | POA: Diagnosis present

## 2014-10-30 DIAGNOSIS — I482 Chronic atrial fibrillation: Secondary | ICD-10-CM | POA: Diagnosis present

## 2014-10-30 LAB — COMPREHENSIVE METABOLIC PANEL
ALBUMIN: 3.3 g/dL — AB (ref 3.5–5.0)
ALT: 31 U/L (ref 14–54)
AST: 42 U/L — ABNORMAL HIGH (ref 15–41)
Alkaline Phosphatase: 85 U/L (ref 38–126)
Anion gap: 10 (ref 5–15)
BUN: 79 mg/dL — ABNORMAL HIGH (ref 6–20)
CALCIUM: 9.6 mg/dL (ref 8.9–10.3)
CO2: 34 mmol/L — ABNORMAL HIGH (ref 22–32)
CREATININE: 3.74 mg/dL — AB (ref 0.44–1.00)
Chloride: 98 mmol/L — ABNORMAL LOW (ref 101–111)
GFR calc non Af Amer: 11 mL/min — ABNORMAL LOW (ref >60)
GFR, EST AFRICAN AMERICAN: 12 mL/min — AB (ref >60)
GLUCOSE: 117 mg/dL — AB (ref 65–99)
Potassium: 4.6 mmol/L (ref 3.5–5.1)
SODIUM: 142 mmol/L (ref 135–145)
Total Bilirubin: 1 mg/dL (ref 0.3–1.2)
Total Protein: 6.5 g/dL (ref 6.5–8.1)

## 2014-10-30 LAB — PROTIME-INR
INR: 3.52 — ABNORMAL HIGH (ref 0.00–1.49)
PROTHROMBIN TIME: 34.5 s — AB (ref 11.6–15.2)

## 2014-10-30 LAB — CBC
HCT: 34.4 % — ABNORMAL LOW (ref 36.0–46.0)
Hemoglobin: 11.1 g/dL — ABNORMAL LOW (ref 12.0–15.0)
MCH: 28.2 pg (ref 26.0–34.0)
MCHC: 32.3 g/dL (ref 30.0–36.0)
MCV: 87.5 fL (ref 78.0–100.0)
PLATELETS: 316 10*3/uL (ref 150–400)
RBC: 3.93 MIL/uL (ref 3.87–5.11)
RDW: 16.7 % — ABNORMAL HIGH (ref 11.5–15.5)
WBC: 4.1 10*3/uL (ref 4.0–10.5)

## 2014-10-30 LAB — GLUCOSE, CAPILLARY: GLUCOSE-CAPILLARY: 100 mg/dL — AB (ref 65–99)

## 2014-10-30 MED ORDER — METOPROLOL TARTRATE 12.5 MG HALF TABLET
12.5000 mg | ORAL_TABLET | Freq: Two times a day (BID) | ORAL | Status: DC
  Administered 2014-10-30 – 2014-10-31 (×2): 12.5 mg via ORAL
  Filled 2014-10-30 (×4): qty 1

## 2014-10-30 MED ORDER — FERROUS SULFATE 325 (65 FE) MG PO TABS
325.0000 mg | ORAL_TABLET | Freq: Three times a day (TID) | ORAL | Status: DC
  Administered 2014-10-31 – 2014-11-01 (×6): 325 mg via ORAL
  Filled 2014-10-30 (×9): qty 1

## 2014-10-30 MED ORDER — CALCIUM CARBONATE-VITAMIN D 500-200 MG-UNIT PO TABS
1.0000 | ORAL_TABLET | Freq: Every day | ORAL | Status: DC
  Administered 2014-10-31 – 2014-11-01 (×2): 1 via ORAL
  Filled 2014-10-30 (×3): qty 1

## 2014-10-30 MED ORDER — SODIUM CHLORIDE 0.9 % IV SOLN
INTRAVENOUS | Status: DC
  Administered 2014-10-30: 22:00:00 via INTRAVENOUS
  Administered 2014-10-31: 125 mL via INTRAVENOUS
  Administered 2014-10-31 – 2014-11-02 (×5): via INTRAVENOUS

## 2014-10-30 MED ORDER — FUROSEMIDE 40 MG PO TABS: 40.0000 mg | ORAL_TABLET | Freq: Every day | ORAL | Status: DC | PRN

## 2014-10-30 MED ORDER — AMIODARONE HCL 200 MG PO TABS
200.0000 mg | ORAL_TABLET | Freq: Two times a day (BID) | ORAL | Status: DC
Start: 2014-10-30 — End: 2014-11-05
  Administered 2014-10-30 – 2014-11-05 (×12): 200 mg via ORAL
  Filled 2014-10-30 (×14): qty 1

## 2014-10-30 MED ORDER — VITAMIN B-1 100 MG PO TABS
100.0000 mg | ORAL_TABLET | Freq: Every day | ORAL | Status: DC
  Administered 2014-10-31 – 2014-11-05 (×6): 100 mg via ORAL
  Filled 2014-10-30 (×8): qty 1

## 2014-10-30 MED ORDER — ATORVASTATIN CALCIUM 10 MG PO TABS
10.0000 mg | ORAL_TABLET | Freq: Every day | ORAL | Status: DC
  Administered 2014-10-31 – 2014-11-04 (×5): 10 mg via ORAL
  Filled 2014-10-30 (×5): qty 1

## 2014-10-30 MED ORDER — ACETAMINOPHEN 500 MG PO TABS: 500.0000 mg | ORAL_TABLET | Freq: Four times a day (QID) | ORAL | Status: DC | PRN

## 2014-10-30 MED ORDER — LINAGLIPTIN 5 MG PO TABS
5.0000 mg | ORAL_TABLET | Freq: Every day | ORAL | Status: DC
  Administered 2014-10-31 – 2014-11-05 (×6): 5 mg via ORAL
  Filled 2014-10-30 (×8): qty 1

## 2014-10-30 MED ORDER — ENSURE ENLIVE PO LIQD
237.0000 mL | Freq: Two times a day (BID) | ORAL | Status: DC
  Administered 2014-10-31 – 2014-11-04 (×9): 237 mL via ORAL

## 2014-10-30 MED ORDER — ANASTROZOLE 1 MG PO TABS
1.0000 mg | ORAL_TABLET | Freq: Every day | ORAL | Status: DC
  Administered 2014-10-31 – 2014-11-04 (×5): 1 mg via ORAL
  Filled 2014-10-30 (×10): qty 1

## 2014-10-30 MED ORDER — WARFARIN SODIUM 2.5 MG PO TABS: 2.5000 mg | ORAL_TABLET | Freq: Every day | ORAL | Status: DC

## 2014-10-30 MED ORDER — ADULT MULTIVITAMIN W/MINERALS CH
1.0000 | ORAL_TABLET | Freq: Every day | ORAL | Status: DC
  Administered 2014-10-31 – 2014-11-05 (×6): 1 via ORAL
  Filled 2014-10-30 (×8): qty 1

## 2014-10-30 MED ORDER — ADULT MULTIVITAMIN W/MINERALS CH: 1.0000 | ORAL_TABLET | ORAL | Status: DC

## 2014-10-30 MED ORDER — FOLIC ACID 1 MG PO TABS
1.0000 mg | ORAL_TABLET | Freq: Every day | ORAL | Status: DC
  Administered 2014-10-30 – 2014-11-05 (×7): 1 mg via ORAL
  Filled 2014-10-30 (×9): qty 1

## 2014-10-30 NOTE — Patient Outreach (Signed)
[facility]) Care Management  10/30/2014  Alison Gross [birth year] [id number]   In the meantime, CSW will mail patient a packet of information for her review.  This packet of information will include all of the following:  A list of resources to assist patient in the home, as well as in the community, while patient's husband and daughter are unavailable to provide 24 hour care and supervision.  More specifically, patient will receive a list of private agency sitters, a brochure for the PACE (Program of [place] for the Elderly) Program, a brochure for the ACE ([place] for Enrichment) Program and a [employer] ( [place]) brochure.  CSW will also be sure to enclose a business card with CSW's contact information.  Nat Christen, BSW, MSW, [place] Management [place], [place], [place] [address] [name] [phone]

## 2014-10-30 NOTE — H&P (Addendum)
Chief Complaint:  Alison Gross is a 81 year old African-American female with history of hypertension, diabetes mellitus, and history of breast cancer status post bilateral mastectomy in the remote past, retrosternal Goitre was initially referred to me for evaluation of atrial fibrillation detected during normal physical examination on 09/07/2013. She converted to sinus rhythm spontaneously.  She was recently admitted to the hospital on 09/19/2014 with shortness of breath and diagnosed with pleural effusion which has been recurrent over the past year secondary to a very large thyroid goiter. While in the hospital, she had a central venous catheter placed and Xarelto was held in order to perform thoracentesis. She subsequently developed DVTs to the right axillary and subclavian veins. Due to declining renal function from acute illness, Xarelto was no longer appropriate and presently on warfarin. Her INR is being checked at home by home health nurse. She was also started on Amiodarone for A. Fib with RVR. She currently denies any shortness of breath and has a Pleurx drain in place with home health nurse coming out to house 3 times a week to remove collected fluid.  She is still draining 400-624m fluid a day. Presnts for f/u and evaluation of INR. She was seen by Dr. MEdrick Oh renal function continues to deteriorate. She continues to have worsening leg edema. She has chronic shortness of breath and dyspnea on exertion.  I had a long discussion with Dr. MEdrick Oh also with Dr. GServando Snareabout 4 days ago when I saw her in the office and she is now being admitted to the hospital for fluid challenge to see whether her renal function will improve. Also in the same token, Dr. GServando Snarewill be seeing the patient for evaluation of recurrent pleural effusion and consideration for VATS and/or pleurodesis.  From cardiac standpoint she has remained stable however due to recurrent admission to the hospital and  failure to thrive and the risk of development of end-stage renal disease which is been unexplained, I felt she needed to be in the hospital for fluid challenge and Dr. MEdrick Ohagreed upon this.  Past Medical History  Diagnosis Date  . Hypercholesterolemia     takes Crestor daily  . Thyroid disease     had iodine radiation  . Hypertension     takes Hyzaar daily  . Dysrhythmia     takes Carvedilol daily  . Pneumonia     history of;last time about 4-559yrago  . GERD (gastroesophageal reflux disease)     takes Protonix daily  . Gastric ulcer   . History of blood transfusion   . History of colon polyps   . Anemia     takes iron pill daily  . Diabetes mellitus without complication     takes Tradjenta daily  . History of radiation therapy 07/12/12-08/26/12    right breast/  . Paroxysmal atrial fibrillation     PCP EKG 09/27/2013: A. Fibrillation.. EKG 09/29/2013: S. Tach.  . Breast cancer 04/12/12    right-pos lymph node/left-DCIS  . Shortness of breath on exertion   . Bruit of left carotid artery   . Acute upper GI bleeding 01/24/2012  . MGUS (monoclonal gammopathy of unknown significance) 04/21/2013  . Osteoporosis 11/29/2013  . Recurrent right pleural effusion 07/26/2014  . Stage III chronic kidney disease 07/26/2014  . Goiter 07/27/2014  . Substernal goiter 12/23/2012  . Pleural effusion, right 12/23/2012  . Type 2 diabetes mellitus with renal manifestations 07/26/2014  . Dysphonia 09/20/2014  . Vocal cord paralysis  Suspected due to massive substernal goiter  . CHF (congestive heart failure)     Past Surgical History  Procedure Laterality Date  . Carpal tunnel release      left  . Breast biopsy    . Esophagogastroduodenoscopy  01/24/2012    Procedure: ESOPHAGOGASTRODUODENOSCOPY (EGD);  Surgeon: Jeryl Columbia, MD;  Location: [facility];  Service: Endoscopy;  Laterality: N/A;  . Thyroid goiter removed    . Cyst removed from back of neck    . Knee surgery      right with  rods  . Esophagogastroduodenoscopy    . Colonoscopy    . Total mastectomy Bilateral 05/10/2012    Procedure: RIGHT modified mastectomy; LEFT total mastectomy;  Surgeon: [name], MD;  Location: [facility];  Service: General;  Laterality: Bilateral;  . Axillary sentinel node biopsy Right 05/10/2012    Procedure: AXILLARY SENTINEL lymph  NODE BIOPSY;  Surgeon: [name], MD;  Location: [facility];  Service: General;  Laterality: Right;  nuclear medicine injection right side  7:00 am   . Abdominal hysterectomy      fibroids, with bilateral SO  . Chest tube insertion Right 09/27/2014    Procedure: INSERTION OF RIGHT PLEURAL DRAINAGE CATHETER;  Surgeon: [name], MD;  Location: [place];  Service: Thoracic;  Laterality: Right;    Family History  Problem Relation Age of Onset  . Brain cancer Mother   . Aneurysm Father   . Diabetes Sister   . Diabetes Brother   . Diabetes Sister   . Diabetes Brother   . Heart failure Sister    Social History:  reports that she quit smoking about 43 years ago. Her smoking use included Cigarettes. She smoked 0.00 packs per day. She has never used smokeless tobacco. She reports that she does not drink alcohol or use illicit drugs.  Allergies: No Known Allergies  Review of Systems - shortness of breath, fatigue, worsening leg edema. Other systems negative.  Blood pressure 99/47, pulse 54, temperature 97.9 F (36.6 C), temperature source Oral, resp. rate 16, SpO2 100 %. General appearance: alert, cooperative, appears stated age, no distress and appears  wasted Eyes: negative findings: lids and lashes normal Neck: no carotid bruit, no JVD and supple, symmetrical, trachea midline Neck: JVP - normal, carotids 2+= without bruits Resp: decreased breath sounds bilaterally more than halfway at the bases. Chest wall: no tenderness Cardio: S1, S2 normal, no S3 or S4 and systolic murmur: early systolic 3/6, crescendo at lower left sternal border, at  apex GI: soft, non-tender; bowel sounds normal; no masses,  no organomegaly Extremities: edema 2+, nontender. Full range of motion in extremities. Pulses: No carotid bruit, femoral pulses normal. Pedal pulses faint. Skin: Skin color, texture, turgor normal. No rashes or lesions Neurologic: Grossly normal  No results found for this or any previous visit (from the past 48 hour(s)). No results found.  Labs:   Lab Results  Component Value Date   WBC 5.3 10/03/2014   HGB 8.8* 10/03/2014   HCT 26.9* 10/03/2014   MCV 87.9 10/03/2014   PLT 305 10/03/2014   Recent Labs  09/19/14 0750 09/20/14 1745 09/28/14 0238  BNP 769.3* 808.4* 947.8*    HEMOGLOBIN A1C Lab Results  Component Value Date   HGBA1C 6.2* 07/27/2014   MPG 131 07/27/2014    Cardiac Panel (last 3 results)  Recent Labs  09/20/14 1745 09/21/14 0030 09/21/14 0530  TROPONINI 0.33* 0.30* 0.29*    Lab Results  Component Value Date   TROPONINI 0.29* 09/21/2014     TSH  Recent Labs  07/27/14 0515 08/23/14 0822  TSH 1.036 1.115    EKG: 09/24/2014: Normal sinus rhythm, left axis deviation, left anterior fascicular block. Poor RV progression, LVH with repolarization abnormality.  Medications Prior to Admission  Medication Sig Dispense Refill  . acetaminophen (TYLENOL) 500 MG tablet Take 500 mg by mouth every 6 (six) hours as needed for moderate pain or headache.    Marland Kitchen amiodarone (PACERONE) 200 MG tablet Take 1 tablet (200 mg total) by mouth 2 (two) times daily. 60 tablet 0  . anastrozole (ARIMIDEX) 1 MG tablet TAKE 1 TABLET (1 MG TOTAL) BY MOUTH DAILY. 30 tablet 5  . atorvastatin (LIPITOR) 10 MG tablet Take 1 tablet (10 mg total) by mouth daily at 6 PM. 30 tablet 0  . calcium-vitamin D (OSCAL WITH D) 500-200 MG-UNIT per tablet Take 1 tablet by mouth daily with breakfast.    . feeding supplement, ENSURE ENLIVE, (ENSURE ENLIVE) LIQD Take 237 mLs by mouth 2 (two) times daily between meals. (Patient not taking:  Reported on 10/06/2014) 237 mL 12  . ferrous sulfate 325 (65 FE) MG tablet Take 1 tablet (325 mg total) by mouth 3 (three) times daily with meals. 90 tablet 0  . furosemide (LASIX) 40 MG tablet Take 40 mg by mouth daily as needed for fluid or edema.   2  . linagliptin (TRADJENTA) 5 MG TABS tablet Take 5 mg by mouth daily.    . metoprolol tartrate (LOPRESSOR) 25 MG tablet Take 0.5 tablets (12.5 mg total) by mouth 2 (two) times daily. 60 tablet 0  . Multiple Vitamin (MULTIVITAMIN WITH MINERALS) TABS Take 1 tablet by mouth every other day.     . warfarin (COUMADIN) 2.5 MG tablet Take 1 tablet (2.5 mg total) by mouth daily. Start 10/05/14 7 tablet 0      Current facility-administered medications:  .  0.9 %  sodium chloride infusion, , Intravenous, Continuous, Adrian Prows, MD .  folic acid (FOLVITE) tablet 1 mg, 1 mg, Oral, Daily, Adrian Prows, MD .  multivitamin with minerals tablet 1 tablet, 1 tablet, Oral, Daily, Adrian Prows, MD .  thiamine (VITAMIN B-1) tablet 100 mg, 100 mg, Oral, Daily, Adrian Prows, MD  Assessment/Plan 1. Worsening acute on chronic renal insufficiency, stage IV chronic kidney disease, etiology not known. Discussed with Dr. Edrick Oh who feels it is probably related to volume depletion from recurrent pleurocentesis. Patient now admitted for fluid challenge. I will have nephrology see the patient. 2. Recurrent pleural effusion, exudative type, patient presently has PleuRx catheter. Dr. Servando Snare has been consulted for management of same and to consider further therapy. 3. Paroxysmal atrial fibrillation, intense sinus rhythm. CHA2DS2-VASc Score is 5 with yearly risk of stroke of 6.7% per year.  I will hold Coumadin and start the patient on IV heparin until evaluated by Dr. Servando Snare in the morning.   Adrian Prows, MD 10/30/2014, 7:18 PM [facility] [place] Pager: [phone] Office: [phone] If no answer: Cell:  [phone]

## 2014-10-30 NOTE — Progress Notes (Signed)
Patient present on floor. Alert and oriented. Family at bedside.

## 2014-10-30 NOTE — Progress Notes (Signed)
ANTICOAGULATION CONSULT NOTE - Initial Consult  Pharmacy Consult:  Heparin Indication:  History of AFib and recent DVT   No Known Allergies  Patient Measurements: Height = 64 inches Weight = 53.5 kg Heparin Dosing Weight: 53 kg  Vital Signs: Temp: 97.9 F (36.6 C) (08/22 1857) Temp Source: Oral (08/22 1857) BP: 99/47 mmHg (08/22 1857) Pulse Rate: 54 (08/22 1857)  Labs: No results for input(s): HGB, HCT, PLT, APTT, LABPROT, INR, HEPARINUNFRC, CREATININE, CKTOTAL, CKMB, TROPONINI in the last 72 hours.  CrCl cannot be calculated (Patient has no serum creatinine result on file.).   Medical History: Past Medical History  Diagnosis Date  . Hypercholesterolemia     takes Crestor daily  . Thyroid disease     had iodine radiation  . Hypertension     takes Hyzaar daily  . Dysrhythmia     takes Carvedilol daily  . Pneumonia     history of;last time about 4-31yr ago  . GERD (gastroesophageal reflux disease)     takes Protonix daily  . Gastric ulcer   . History of blood transfusion   . History of colon polyps   . Anemia     takes iron pill daily  . Diabetes mellitus without complication     takes Tradjenta daily  . History of radiation therapy 07/12/12-08/26/12    right breast/  . Paroxysmal atrial fibrillation     PCP EKG 09/27/2013: A. Fibrillation.. EKG 09/29/2013: S. Tach.  . Breast cancer 04/12/12    right-pos lymph node/left-DCIS  . Shortness of breath on exertion   . Bruit of left carotid artery   . Acute upper GI bleeding 01/24/2012  . MGUS (monoclonal gammopathy of unknown significance) 04/21/2013  . Osteoporosis 11/29/2013  . Recurrent right pleural effusion 07/26/2014  . Stage III chronic kidney disease 07/26/2014  . Goiter 07/27/2014  . Substernal goiter 12/23/2012  . Pleural effusion, right 12/23/2012  . Type 2 diabetes mellitus with renal manifestations 07/26/2014  . Dysphonia 09/20/2014  . Vocal cord paralysis     Suspected due to massive substernal goiter  .  CHF (congestive heart failure)        Assessment: 89YOF with history of Afib and recent DVT on Coumadin PTA.  Patient to transition to IV heparin for possible VATS +/- pleurodesis.  Labs pending collection.   Goal of Therapy:  Heparin level 0.3-0.7 units/ml Monitor platelets by anticoagulation protocol: Yes    Plan:  - F/U INR and start IV heparin if/when INR < 2    Kenedi Cilia D. DMina Marble PharmD, BCPS Pager:  [phone]/22/2016, 8:03 PM

## 2014-10-30 NOTE — Patient Outreach (Signed)
[facility]) Care Management  10/30/2014  Alison Gross 26-Oct-1932 [id number]   CSW made a fourth and final attempt to try and contact patient and patient's husband, Alison Gross today to perform phone assessment. Patient, nor [name] were available at the time of CSW's call.  A HIPAA compliant message was left on voicemail for both.  CSW will mail an outreach letter to patient's home, encouraging patient to return CSW's call at her earliest convenience.  If CSW does not receive a return call from patient within 10 business days, CSW will proceed with case closure on patient.  Nat Christen, BSW, MSW, [facility] [place], [place], [place] [address] [name] [phone]

## 2014-10-31 ENCOUNTER — Other Ambulatory Visit: Payer: Self-pay | Admitting: *Deleted

## 2014-10-31 ENCOUNTER — Other Ambulatory Visit: Payer: Self-pay | Admitting: Cardiothoracic Surgery

## 2014-10-31 ENCOUNTER — Inpatient Hospital Stay (HOSPITAL_COMMUNITY): Payer: Commercial Managed Care - HMO

## 2014-10-31 DIAGNOSIS — J90 Pleural effusion, not elsewhere classified: Secondary | ICD-10-CM

## 2014-10-31 LAB — BASIC METABOLIC PANEL
Anion gap: 6 (ref 5–15)
BUN: 57 mg/dL — ABNORMAL HIGH (ref 6–20)
CALCIUM: 6.7 mg/dL — AB (ref 8.9–10.3)
CO2: 24 mmol/L (ref 22–32)
CREATININE: 2.44 mg/dL — AB (ref 0.44–1.00)
Chloride: 115 mmol/L — ABNORMAL HIGH (ref 101–111)
GFR calc non Af Amer: 18 mL/min — ABNORMAL LOW (ref >60)
GFR, EST AFRICAN AMERICAN: 21 mL/min — AB (ref >60)
Glucose, Bld: 77 mg/dL (ref 65–99)
Potassium: 3.1 mmol/L — ABNORMAL LOW (ref 3.5–5.1)
SODIUM: 145 mmol/L (ref 135–145)

## 2014-10-31 LAB — URINALYSIS, ROUTINE W REFLEX MICROSCOPIC
Bilirubin Urine: NEGATIVE
Glucose, UA: NEGATIVE mg/dL
Hgb urine dipstick: NEGATIVE
KETONES UR: NEGATIVE mg/dL
LEUKOCYTES UA: NEGATIVE
NITRITE: NEGATIVE
PH: 5.5 (ref 5.0–8.0)
Protein, ur: NEGATIVE mg/dL
Specific Gravity, Urine: 1.016 (ref 1.005–1.030)
UROBILINOGEN UA: 0.2 mg/dL (ref 0.0–1.0)

## 2014-10-31 LAB — PROTIME-INR
INR: 5.48 — AB (ref 0.00–1.49)
INR: 3.27 — ABNORMAL HIGH (ref 0.00–1.49)
PROTHROMBIN TIME: 48.1 s — AB (ref 11.6–15.2)
Prothrombin Time: 32.6 seconds — ABNORMAL HIGH (ref 11.6–15.2)

## 2014-10-31 LAB — GLUCOSE, CAPILLARY
GLUCOSE-CAPILLARY: 98 mg/dL (ref 65–99)
Glucose-Capillary: 95 mg/dL (ref 65–99)
Glucose-Capillary: 91 mg/dL (ref 65–99)
Glucose-Capillary: 98 mg/dL (ref 65–99)

## 2014-10-31 MED ORDER — POTASSIUM CHLORIDE CRYS ER 20 MEQ PO TBCR
40.0000 meq | EXTENDED_RELEASE_TABLET | Freq: Once | ORAL | Status: AC
  Administered 2014-10-31: 40 meq via ORAL
  Filled 2014-10-31: qty 2

## 2014-10-31 MED ORDER — PHYTONADIONE 5 MG PO TABS
5.0000 mg | ORAL_TABLET | Freq: Once | ORAL | Status: AC
  Administered 2014-10-31: 5 mg via ORAL
  Filled 2014-10-31: qty 1

## 2014-10-31 MED ORDER — PHYTONADIONE 5 MG PO TABS
2.5000 mg | ORAL_TABLET | Freq: Once | ORAL | Status: AC
  Administered 2014-10-31: 2.5 mg via ORAL
  Filled 2014-10-31: qty 1

## 2014-10-31 NOTE — Progress Notes (Signed)
INR level 5.48. NP made aware. No new order given.

## 2014-10-31 NOTE — Consult Note (Signed)
Reason for Consult: worsening kidney function Referring Physician: Dr. Einar Gip   HPI:   Alison Gross is an 81 y.o. female w/ hx of HTN, DM II, anemia, pAfib, breast cancer s/p b/l mastectomy, MGUS, CKD III, retrosternal Goiter, recent admission on 7/12 for SOB due to pleural effusion 2/2 to very large thyroid goiter. Pleural effusion has been a recurrent problem for her. Thoracentesis was done and right sided PleurX cath was placed. In the hospital xarelto was held for thoracentesis and patient developed DVT on right axillary and subclavian veins. xarelto was switched to warfarin b/c of worsening kidney function.   At home she continue to have high output from her PleurX, continued to have dyspnea. Had leg swelling as well. Was put on lasix by Dr. Einar Gip per patient (not sure how much was the dose). She was admitted this time by Dr. Einar Gip because of worsening kidney function on outpatient lab. Was given fluid b/c AKI was thought to be 2/2 to volume depletion.   CT surgery was also consulted, considering right VATS and possible TALC for recurrent right pleural effusion.. Also has right v ocal cord paralysis thought to be 2/2 to the large goiter.   Trend in Creatinine: CREATININE  Date/Time Value Ref Range Status  09/18/2014 09:46 AM 1.7* 0.6 - 1.1 mg/dL Final  08/04/2014 01:44 PM 1.8* 0.6 - 1.1 mg/dL Final  07/11/2014 11:02 AM 1.4* 0.6 - 1.1 mg/dL Final  02/20/2014 11:02 AM 1.4* 0.6 - 1.1 mg/dL Final  01/18/2014 02:40 PM 1.4* 0.6 - 1.1 mg/dL Final  11/29/2013 01:13 PM 1.3* 0.6 - 1.1 mg/dL Final  07/21/2013 12:01 PM 1.2* 0.6 - 1.1 mg/dL Final  04/14/2013 09:55 AM 1.3* 0.6 - 1.1 mg/dL Final  02/08/2013 09:55 AM 1.3* 0.6 - 1.1 mg/dL Final  12/23/2012 01:22 PM 1.3* 0.6 - 1.1 mg/dL Final  11/03/2012 01:25 PM 1.2* 0.6 - 1.1 mg/dL Final  08/05/2012 10:09 AM 1.2* 0.6 - 1.1 mg/dL Final  06/03/2012 03:53 PM 1.4* 0.6 - 1.1 mg/dL Final  04/21/2012 08:06 AM 1.7* 0.6 - 1.1 mg/dL Final   CREATININE,  SER  Date/Time Value Ref Range Status  10/31/2014 07:42 AM 2.44* 0.44 - 1.00 mg/dL Final    Comment:    RESULTS VERIFIED VIA RECOLLECT  10/30/2014 08:13 PM 3.74* 0.44 - 1.00 mg/dL Final  10/03/2014 03:27 AM 1.57* 0.44 - 1.00 mg/dL Final  10/01/2014 03:35 AM 1.54* 0.44 - 1.00 mg/dL Final  09/29/2014 03:40 AM 1.59* 0.44 - 1.00 mg/dL Final  09/28/2014 02:38 AM 1.50* 0.44 - 1.00 mg/dL Final  09/27/2014 03:49 AM 1.49* 0.44 - 1.00 mg/dL Final  09/26/2014 10:19 PM 1.50* 0.44 - 1.00 mg/dL Final  09/26/2014 03:37 AM 1.50* 0.44 - 1.00 mg/dL Final  09/25/2014 04:00 AM 1.52* 0.44 - 1.00 mg/dL Final  09/24/2014 03:15 AM 1.64* 0.44 - 1.00 mg/dL Final  09/23/2014 05:15 AM 1.90* 0.44 - 1.00 mg/dL Final  09/22/2014 05:00 AM 1.88* 0.44 - 1.00 mg/dL Final  09/21/2014 11:20 PM 2.05* 0.44 - 1.00 mg/dL Final  09/21/2014 05:30 AM 1.83* 0.44 - 1.00 mg/dL Final  09/20/2014 03:10 PM 1.77* 0.44 - 1.00 mg/dL Final  09/20/2014 03:55 AM 1.34* 0.44 - 1.00 mg/dL Final  09/19/2014 07:50 AM 1.52* 0.44 - 1.00 mg/dL Final  08/25/2014 05:24 AM 1.81* 0.44 - 1.00 mg/dL Final  08/24/2014 05:08 AM 1.89* 0.44 - 1.00 mg/dL Final  08/23/2014 04:26 AM 1.61* 0.44 - 1.00 mg/dL Final  08/22/2014 08:30 AM 1.62* 0.44 - 1.00 mg/dL Final  07/28/2014  03:55 AM 1.60* 0.44 - 1.00 mg/dL Final  07/27/2014 05:15 AM 1.61* 0.44 - 1.00 mg/dL Final  07/26/2014 10:10 AM 1.45* 0.44 - 1.00 mg/dL Final  10/18/2013 09:58 AM 1.40* 0.50 - 1.10 mg/dL Final  05/07/2012 11:18 AM 1.26* 0.50 - 1.10 mg/dL Final  01/25/2012 05:11 AM 1.31* 0.50 - 1.10 mg/dL Final  01/24/2012 07:45 AM 1.37* 0.50 - 1.10 mg/dL Final  04/25/2008 09:00 AM 1.36* 0.4 - 1.2 mg/dL Final    PMH:   Past Medical History  Diagnosis Date  . Hypercholesterolemia     takes Crestor daily  . Thyroid disease     had iodine radiation  . Hypertension     takes Hyzaar daily  . Dysrhythmia     takes Carvedilol daily  . Pneumonia     history of;last time about 4-35yr ago  . GERD  (gastroesophageal reflux disease)     takes Protonix daily  . Gastric ulcer   . History of blood transfusion   . History of colon polyps   . Anemia     takes iron pill daily  . Diabetes mellitus without complication     takes Tradjenta daily  . History of radiation therapy 07/12/12-08/26/12    right breast/  . Paroxysmal atrial fibrillation     PCP EKG 09/27/2013: A. Fibrillation.. EKG 09/29/2013: S. Tach.  . Breast cancer 04/12/12    right-pos lymph node/left-DCIS  . Shortness of breath on exertion   . Bruit of left carotid artery   . Acute upper GI bleeding 01/24/2012  . MGUS (monoclonal gammopathy of unknown significance) 04/21/2013  . Osteoporosis 11/29/2013  . Recurrent right pleural effusion 07/26/2014  . Stage III chronic kidney disease 07/26/2014  . Goiter 07/27/2014  . Substernal goiter 12/23/2012  . Pleural effusion, right 12/23/2012  . Type 2 diabetes mellitus with renal manifestations 07/26/2014  . Dysphonia 09/20/2014  . Vocal cord paralysis     Suspected due to massive substernal goiter  . CHF (congestive heart failure)     PSH:   Past Surgical History  Procedure Laterality Date  . Carpal tunnel release      left  . Breast biopsy    . Esophagogastroduodenoscopy  01/24/2012    Procedure: ESOPHAGOGASTRODUODENOSCOPY (EGD);  Surgeon: MJeryl Columbia MD;  [facility];  Service: Endoscopy;  Laterality: N/A;  . Thyroid goiter removed    . Cyst removed from back of neck    . Knee surgery      right with rods  . Esophagogastroduodenoscopy    . Colonoscopy    . Total mastectomy Bilateral 05/10/2012    Procedure: RIGHT modified mastectomy; LEFT total mastectomy;  Surgeon: CHaywood Lasso MD;  [facility]  Service: General;  Laterality: Bilateral;  . Axillary sentinel node biopsy Right 05/10/2012    Procedure: AXILLARY SENTINEL lymph  NODE BIOPSY;  Surgeon: CHaywood Lasso MD;  [facility]  Service: General;  Laterality: Right;  nuclear medicine injection  right side  7:00 am   . Abdominal hysterectomy      fibroids, with bilateral SO  . Chest tube insertion Right 09/27/2014    Procedure: INSERTION OF RIGHT PLEURAL DRAINAGE CATHETER;  Surgeon: EGrace Isaac MD;  [facility]  Service: Thoracic;  Laterality: Right;    Allergies: No Known Allergies  Medications:   Prior to Admission medications   Medication Sig Start Date End Date Taking? Authorizing Provider  acetaminophen (TYLENOL) 500 MG tablet Take 500 mg by mouth  every 6 (six) hours as needed for moderate pain or headache.    Historical Provider, MD  amiodarone (PACERONE) 200 MG tablet Take 1 tablet (200 mg total) by mouth 2 (two) times daily. 10/03/14   Orson Eva, MD  anastrozole (ARIMIDEX) 1 MG tablet TAKE 1 TABLET (1 MG TOTAL) BY MOUTH DAILY. 10/17/14   Laurie Panda, NP  atorvastatin (LIPITOR) 10 MG tablet Take 1 tablet (10 mg total) by mouth daily at 6 PM. 08/25/14   Janece Canterbury, MD  calcium-vitamin D (OSCAL WITH D) 500-200 MG-UNIT per tablet Take 1 tablet by mouth daily with breakfast.    Historical Provider, MD  feeding supplement, ENSURE ENLIVE, (ENSURE ENLIVE) LIQD Take 237 mLs by mouth 2 (two) times daily between meals. Patient not taking: Reported on 10/06/2014 07/28/14   Venetia Maxon Rama, MD  ferrous sulfate 325 (65 FE) MG tablet Take 1 tablet (325 mg total) by mouth 3 (three) times daily with meals. 08/25/14   Janece Canterbury, MD  furosemide (LASIX) 40 MG tablet Take 40 mg by mouth daily as needed for fluid or edema.  07/28/14   Historical Provider, MD  linagliptin (TRADJENTA) 5 MG TABS tablet Take 5 mg by mouth daily.    Historical Provider, MD  metoprolol tartrate (LOPRESSOR) 25 MG tablet Take 0.5 tablets (12.5 mg total) by mouth 2 (two) times daily. 10/03/14   Orson Eva, MD  Multiple Vitamin (MULTIVITAMIN WITH MINERALS) TABS Take 1 tablet by mouth every other day.     Historical Provider, MD  warfarin (COUMADIN) 2.5 MG tablet Take 1 tablet (2.5 mg total) by mouth  daily. Start 10/05/14 10/05/14   Orson Eva, MD    Inpatient medications: . amiodarone  200 mg Oral BID  . anastrozole  1 mg Oral Daily  . atorvastatin  10 mg Oral q1800  . calcium-vitamin D  1 tablet Oral Q breakfast  . feeding supplement (ENSURE ENLIVE)  237 mL Oral BID BM  . ferrous sulfate  325 mg Oral TID WC  . folic acid  1 mg Oral Daily  . linagliptin  5 mg Oral Daily  . metoprolol tartrate  12.5 mg Oral BID  . multivitamin with minerals  1 tablet Oral Daily  . thiamine  100 mg Oral Daily    Discontinued Meds:   Medications Discontinued During This Encounter  Medication Reason  . warfarin (COUMADIN) tablet 2.5 mg   . multivitamin with minerals tablet 1 tablet Duplicate  . furosemide (LASIX) tablet 40 mg Discontinued by provider    Social History:  reports that she quit smoking about 43 years ago. Her smoking use included Cigarettes. She smoked 0.00 packs per day. She has never used smokeless tobacco. She reports that she does not drink alcohol or use illicit drugs.  Family History:   Family History  Problem Relation Age of Onset  . Brain cancer Mother   . Aneurysm Father   . Diabetes Sister   . Diabetes Brother   . Diabetes Sister   . Diabetes Brother   . Heart failure Sister     Review of Systems  Constitutional: Positive for malaise/fatigue. Negative for fever, chills and diaphoresis.  HENT: Negative for hearing loss and sore throat.   Eyes: Negative for blurred vision, double vision and pain.  Respiratory: Positive for shortness of breath and wheezing. Negative for cough, hemoptysis, sputum production and stridor.   Cardiovascular: Positive for leg swelling. Negative for chest pain, palpitations and claudication.  Gastrointestinal: Negative for heartburn, nausea,  vomiting, abdominal pain, diarrhea, constipation and blood in stool.  Genitourinary: Negative for dysuria, urgency and frequency.  Musculoskeletal: Negative for myalgias, joint pain and neck pain.   Skin: Negative for itching and rash.  Neurological: Negative for dizziness, seizures, loss of consciousness, weakness and headaches.  Psychiatric/Behavioral: Negative for depression.    Weight change:   Intake/Output Summary (Last 24 hours) at 10/31/14 1328 Last data filed at 10/31/14 0800  Gross per 24 hour  Intake    120 ml  Output      0 ml  Net    120 ml   BP 107/47 mmHg  Pulse 62  Temp(Src) 97.7 F (36.5 C) (Oral)  Resp 18  Ht '5\' 4"'$  (1.626 m)  Wt 115 lb (52.164 kg)  BMI 19.73 kg/m2  SpO2 99% Filed Vitals:   10/30/14 1857 10/30/14 2127 10/31/14 0200 10/31/14 0605  BP: '99/47 96/48 85/40 '$ 107/47  Pulse: 54 57 59 62  Temp: 97.9 F (36.6 C) 98.1 F (36.7 C) 98.2 F (36.8 C) 97.7 F (36.5 C)  TempSrc: Oral Oral Oral Oral  Resp: '16 16 16 18  '$ Height:  '5\' 4"'$  (1.626 m)    Weight:  109 lb 12.8 oz (49.805 kg)  115 lb (52.164 kg)  SpO2: 100% 100% 99% 99%     Physical Exam  Constitutional: She is oriented to person, place, and time. She appears well-developed and well-nourished.  Elderly female. Pleasant. Sitting up in bed.   HENT:  Head: Normocephalic and atraumatic.  Mouth/Throat: Oropharynx is clear and moist.  Eyes: Conjunctivae are normal. Pupils are equal, round, and reactive to light. Right eye exhibits no discharge. Left eye exhibits no discharge.  Neck: Normal range of motion.  Hoarseness noted.  Cardiovascular: Normal rate and regular rhythm.  Exam reveals no gallop and no friction rub.   No murmur heard. Respiratory: Effort normal and breath sounds normal.  Low breathe sounds on bases. No wheezing or crackles.   GI: Soft. Bowel sounds are normal. She exhibits no distension. There is no tenderness.  Musculoskeletal: Normal range of motion. She exhibits no tenderness.  Trace edema on both ankles.   Neurological: She is alert and oriented to person, place, and time. No cranial nerve deficit.     Labs: Basic Metabolic Panel:  Recent Labs Lab 10/30/14 2013  10/31/14 0742  NA 142 145  K 4.6 3.1*  CL 98* 115*  CO2 34* 24  GLUCOSE 117* 77  BUN 79* 57*  CREATININE 3.74* 2.44*  ALBUMIN 3.3*  --   CALCIUM 9.6 6.7*   Liver Function Tests:  Recent Labs Lab 10/30/14 2013  AST 42*  ALT 31  ALKPHOS 85  BILITOT 1.0  PROT 6.5  ALBUMIN 3.3*   No results for input(s): LIPASE, AMYLASE in the last 168 hours. No results for input(s): AMMONIA in the last 168 hours. CBC:  Recent Labs Lab 10/30/14 2013  WBC 4.1  HGB 11.1*  HCT 34.4*  MCV 87.5  PLT 316   PT/INR:  '@LABRCNTIP'$ (inr:5) Cardiac Enzymes: )No results for input(s): CKTOTAL, CKMB, CKMBINDEX, TROPONINI in the last 168 hours. CBG:  Recent Labs Lab 10/30/14 2124 10/31/14 0614 10/31/14 1119  GLUCAP 100* 95 91    Iron Studies: No results for input(s): IRON, TIBC, TRANSFERRIN, FERRITIN in the last 168 hours.  Xrays/Other Studies: Dg Chest Port 1 View  10/31/2014   CLINICAL DATA:  Recurrent right pleural effusion. Stage 3 kidney disease.  EXAM: PORTABLE CHEST - 1 VIEW  COMPARISON:  09/28/2014, 09/19/2014 and chest CT 09/19/2014  FINDINGS: Right-sided PleurX catheter unchanged. There is a thin white line over the right apex as this may represent a small residual right pneumothorax, although may represent artifact as there is a similar line over the soft tissues of the right flank. Evidence of a small amount of bilateral pleural fluid unchanged. Remainder of the lungs are clear. Cardiac silhouette unchanged. Calcified plaque over the aortic arch. Stable large substernal goiter.  IMPRESSION: Stable small bilateral pleural effusions.  Tiny right apical pneumothorax versus artifact. Right PleurX catheter unchanged.   Electronically Signed   By: Marin Olp M.D.   On: 10/31/2014 10:14     Assessment/Plan:  81 yo F with hx of DM II, HTN, large right sided goiter, recurrent pleural effusion s/p R PleuRx tube, pAFib, recent DVT, on coumadin, here with worsening kidney function.  1.   AKI on CKD 3 - b/l Crt ~ 1.6. Came in 3.74, was started on IVF and improved to 2.22 today. Having UOP 400.  2. Recurrent right sided pleural effusion - 2/2 to large R goiter has R pleurX cath, CT surgery is evaluation for VATS vs TALC. Needs evaluation for R goiter removal.  3. pAfib and recent DVT - on coumadin (was on xarelto before). On amio.  4. Hypokalemia - repleted 5. Hypocalcemia -   6. Grade 2 diastolic dysfunction with preserved EF  7. Iron deficiency anemia - on PO iron - cont. 8. Hypotensive - now better with fluid. May need to hold antihypertensives if BP decreases further.   Plan:  1. Will get UA. This is likely 2/2 to volume depletion as crt is improving with Fluid. Continue fluid for now. 2. Monitor kidney function daily. 3. Rest per primary.  Ahmed, Tasrif 10/31/2014, 1:28 PM   Pt seen, examined and agree w A/P as above.  Pleasant 81 yo AAF with hx of afib, DVT, anemia and CKD stage 3. Has DM on oral medication, prior hx breast CA and more recently has been having problems with complications of an enlargening thoracic goiter.  For the last month or two she has been having SOB due to recurrent R pleural effusion and now is getting thoracentesis 2-4 times a week.  Has hoarseness also from the goiter.  She was recently started on lasix for ankle swelling and comes in with acute on CKD, creat up mid 3's with low UNa and borderline hypotension.  With fluids the creat is down to 2.7 today. On exam she has some pedal edema but flat neck veins and we should continue fluids for now, avoid nephrotoxins / ACEi, / ARB's and nsaid's. Probably simple vol depletion. Holding lasix. Will follow.  Kelly Splinter MD pager [phone]    cell [phone] 10/31/2014, 4:39 PM

## 2014-10-31 NOTE — Progress Notes (Signed)
Pt BP 94/48 this evening, HR in 50s. Spoke with Dr. Einar Gip, new order to d/c lopressor.

## 2014-10-31 NOTE — Progress Notes (Addendum)
ANTICOAGULATION CONSULT NOTE - Initial Consult  Pharmacy Consult:  Heparin Indication:  History of AFib and recent DVT   No Known Allergies  Patient Measurements: Height = 64 inches Weight = 53.5 kg Heparin Dosing Weight: 53 kg  Vital Signs: Temp: 97.7 F (36.5 C) (08/23 0605) Temp Source: Oral (08/23 0605) BP: 107/47 mmHg (08/23 0605) Pulse Rate: 62 (08/23 0605)  Labs:  Recent Labs  10/30/14 2013 10/31/14 0742  HGB 11.1*  --   HCT 34.4*  --   PLT 316  --   LABPROT 34.5* 48.1*  INR 3.52* 5.48*  CREATININE 3.74* 2.44*    Estimated Creatinine Clearance: 15.4 mL/min (by C-G formula based on Cr of 2.44).   Assessment: 24 YOF with history of Afib and recent DVT on Coumadin PTA.  Patient to transition to IV heparin if INR < 2 for possible VATS +/- pleurodesis. INR 5.48 < 3.74 this morning.   Goal of Therapy:  Heparin level 0.3-0.7 units/ml Monitor platelets by anticoagulation protocol: Yes    Plan:  Will not start IV heparin, Continue hold coumadin F/u CVTS consult   Maryanna Shape, PharmD, BCPS  Clinical Pharmacist  Pager: [phone]   10/31/2014, 10:32 AM   Will give vitamin K '5mg'$  po x 1 per MD request in anticipation of possible VATS.  Will recheck INR this evening 8 hrs post vitamin K, and start heparin if INR < 2  =====================   Addendum: - INR remains above 2 - Additional Vit K 2.'5mg'$  PO given tonight   Plan: - F/U INR in AM and start heparin if < 2    Bettyanne Dittman D. Mina Marble, PharmD, BCPS Pager:  [phone] 10/31/2014, 9:07 PM

## 2014-10-31 NOTE — Consult Note (Signed)
[address]       [place] [address]             [phone]        Alison Gross [place] Medical Record #[id number] Date of Birth: Dec 21, 1932  Referring: Dr. Einar Gip Primary Care: Maximino Greenland, MD  Chief Complaint: Recurrent right pleural effusion   History of Present Illness:       Alison Gross is a 81 yo African American Female with known history of thyroid goiter, right breast cancer (in remission), hyperlipidemia, atrial fibrillation on chronic anticoagulation, HTN, DM, and recurrent pleural effusions. The patient presented to [facility] Emergency Department on 09/19/2014 with complaints of worsening shortness of breath for the past several days. She had undergone several thoracentesis in the past, with most recent being 6/16. Approximately 800 cc of exudative fluid ws removed. CT chest obtained in the ED showed a moderate right sided pleural effusion. The patient was admitted to the medicine service for further workup. The patient underwent IR right thoracentesis which removed 1.2 liters hazy, amber fluid. The fluid was sent for cytology which revealed reactive mesothelial cells without evidence of malignancy. Fluid cultures were negative for bacteria.  The patient also complained of dysphagia while in the hospital and SSLP consult was obtained and showed some dysfunction and her diet was adjusted. Post procedure, the patient developed hypotension with acute shock. She required pressor support. Workup later revealed evidence of adrenal insufficiency. She was treated with steroids and fluid balance was corrected. Troponin was mildly elevated and she was ruled out for acute cardiac event. Once the patient was stabilized, it was felt surgical intervention may be required on her goiter. Cardiac surgery was contacted and plans were made for elective outpatient consultation to consider surgical resection vs. Pleur-x catheter for palliative relief. She  was scheduled to see Dr. Roxan Hockey on 09/26/2014. However, the patient's family was concerned that she was not safe for hospital discharge, so she was transferred to [facility] for further evaluation and treatment. Dr. Servando Snare saw the patient. Ultimately, because of her frail status and protein malnutrition, surgery to remove her goiter was NOT recommended. He did recommend a right Pleur X catheter be placed and this was done on 09/27/2014. Home health has been draining the right Pleur X. The patient states it was last drained yesterday and approximately 600 cc was removed. The patient was admitted because of worsening renal function, LE edema, and dyspnea on exertion. Dr. Servando Snare has been consulted regarding recurrent right pleural effusion and further management.  Current Activity/ Functional Status: Patient is independent with mobility/ambulation, transfers, ADL's, IADL's.   Zubrod Score: At the time of surgery this patient's most appropriate activity status/level should be described as: '[]'$     0    Normal activity, no symptoms '[]'$     1    Restricted in physical strenuous activity but ambulatory, able to do out light work '[x]'$     2    Ambulatory and capable of self care, unable to do work activities, up and about                 more than 50%  Of the time                            '[]'$     3    Only limited self care, in bed greater than 50% of waking hours '[]'$   4    Completely disabled, no self care, confined to bed or chair '[]'$     5    Moribund  Past Medical History  Diagnosis Date  . Hypercholesterolemia     takes Crestor daily  . Thyroid disease     had iodine radiation  . Hypertension     takes Hyzaar daily  . Dysrhythmia     takes Carvedilol daily  . Pneumonia     history of;last time about 4-24yr ago  . GERD (gastroesophageal reflux disease)     takes Protonix daily  . Gastric ulcer   . History of blood transfusion   . History of colon polyps   . Anemia     takes iron pill  daily  . Diabetes mellitus without complication     takes Tradjenta daily  . History of radiation therapy 07/12/12-08/26/12    right breast/  . Paroxysmal atrial fibrillation     PCP EKG 09/27/2013: A. Fibrillation.. EKG 09/29/2013: S. Tach.  . Breast cancer 04/12/12    right-pos lymph node/left-DCIS  . Shortness of breath on exertion   . Bruit of left carotid artery   . Acute upper GI bleeding 01/24/2012  . MGUS (monoclonal gammopathy of unknown significance) 04/21/2013  . Osteoporosis 11/29/2013  . Recurrent right pleural effusion 07/26/2014  . Stage III chronic kidney disease 07/26/2014  . Goiter 07/27/2014  . Substernal goiter 12/23/2012  . Pleural effusion, right 12/23/2012  . Type 2 diabetes mellitus with renal manifestations 07/26/2014  . Dysphonia 09/20/2014  . Vocal cord paralysis     Suspected due to massive substernal goiter  . CHF (congestive heart failure)     Past Surgical History  Procedure Laterality Date  . Carpal tunnel release      left  . Breast biopsy    . Esophagogastroduodenoscopy  01/24/2012    Procedure: ESOPHAGOGASTRODUODENOSCOPY (EGD);  Surgeon: MJeryl Columbia MD;  [facility];  Service: Endoscopy;  Laterality: N/A;  . Thyroid goiter removed    . Cyst removed from back of neck    . Knee surgery      right with rods  . Esophagogastroduodenoscopy    . Colonoscopy    . Total mastectomy Bilateral 05/10/2012    Procedure: RIGHT modified mastectomy; LEFT total mastectomy;  Surgeon: CHaywood Lasso MD;  [facility]  Service: General;  Laterality: Bilateral;  . Axillary sentinel node biopsy Right 05/10/2012    Procedure: AXILLARY SENTINEL lymph  NODE BIOPSY;  Surgeon: CHaywood Lasso MD;  [facility]  Service: General;  Laterality: Right;  nuclear medicine injection right side  7:00 am   . Abdominal hysterectomy      fibroids, with bilateral SO  . Chest tube insertion Right 09/27/2014    Procedure: INSERTION OF RIGHT PLEURAL DRAINAGE  CATHETER;  Surgeon: EGrace Isaac MD;  [facility]  Service: Thoracic;  Laterality: Right;     Social History   Social History  . Marital Status: Married    Spouse Name: JJeneen Rinks . Number of Children: 2  . Years of Education: N/A    Social History Main Topics  . Smoking status: Former Smoker -- 0.00 packs/day    Types: Cigarettes    Quit date: 07/26/1971  . Smokeless tobacco: Never Used  . Alcohol Use: No  . Drug Use: No  . Sexual Activity: Not Currently    Birth Control/ Protection: Surgical     Comment: menarche age 81,1stlive birth [birth year]  P2, no HRT    Social History Narrative   Married.  Ambulates independently.     Allergies: No Known Allergies  Current Facility-Administered Medications  Medication Dose Route Frequency Provider Last Rate Last Dose  . 0.9 %  sodium chloride infusion   Intravenous Continuous Adrian Prows, MD 125 mL/hr at 11/01/14 0406    . acetaminophen (TYLENOL) tablet 500 mg  500 mg Oral Q6H PRN Adrian Prows, MD      . amiodarone (PACERONE) tablet 200 mg  200 mg Oral BID Adrian Prows, MD   200 mg at 10/31/14 2128  . anastrozole (ARIMIDEX) tablet 1 mg  1 mg Oral Daily Adrian Prows, MD   1 mg at 10/31/14 1104  . atorvastatin (LIPITOR) tablet 10 mg  10 mg Oral q1800 Adrian Prows, MD   10 mg at 10/31/14 1755  . calcium-vitamin D (OSCAL WITH D) 500-200 MG-UNIT per tablet 1 tablet  1 tablet Oral Q breakfast Adrian Prows, MD   1 tablet at 11/01/14 0750  . feeding supplement (ENSURE ENLIVE) (ENSURE ENLIVE) liquid 237 mL  237 mL Oral BID BM Adrian Prows, MD   237 mL at 10/31/14 1446  . ferrous sulfate tablet 325 mg  325 mg Oral TID WC Adrian Prows, MD   325 mg at 11/01/14 0750  . folic acid (FOLVITE) tablet 1 mg  1 mg Oral Daily Adrian Prows, MD   1 mg at 10/31/14 0809  . linagliptin (TRADJENTA) tablet 5 mg  5 mg Oral Daily Adrian Prows, MD   5 mg at 10/31/14 0809  . multivitamin with minerals tablet 1 tablet  1 tablet Oral Daily Adrian Prows, MD   1 tablet at 10/31/14 0810  .  pantoprazole (PROTONIX) EC tablet 40 mg  40 mg Oral Q0600 Adrian Prows, MD      . thiamine (VITAMIN B-1) tablet 100 mg  100 mg Oral Daily Adrian Prows, MD   100 mg at 10/31/14 4097    Prescriptions prior to admission  Medication Sig Dispense Refill Last Dose  . acetaminophen (TYLENOL) 500 MG tablet Take 500 mg by mouth 2 (two) times daily as needed for moderate pain or headache.    Past Month at Unknown time  . amiodarone (PACERONE) 200 MG tablet Take 1 tablet (200 mg total) by mouth 2 (two) times daily. 60 tablet 0 10/30/2014 at Unknown time  . anastrozole (ARIMIDEX) 1 MG tablet TAKE 1 TABLET (1 MG TOTAL) BY MOUTH DAILY. 30 tablet 5 10/30/2014 at Unknown time  . Cholecalciferol (VITAMIN D) 2000 UNITS CAPS Take by mouth.   10/29/2014  . feeding supplement, ENSURE ENLIVE, (ENSURE ENLIVE) LIQD Take 237 mLs by mouth 2 (two) times daily between meals. 237 mL 12 10/29/2014  . ferrous sulfate 325 (65 FE) MG tablet Take 1 tablet (325 mg total) by mouth 3 (three) times daily with meals. 90 tablet 0 10/29/2014  . linagliptin (TRADJENTA) 5 MG TABS tablet Take 5 mg by mouth daily.   10/29/2014  . losartan-hydrochlorothiazide (HYZAAR) 100-12.5 MG per tablet Take 0.5 tablets by mouth 2 (two) times daily.    10/29/2014  . metoprolol tartrate (LOPRESSOR) 25 MG tablet Take 0.5 tablets (12.5 mg total) by mouth 2 (two) times daily. 60 tablet 0 10/29/2014 at 2030  . Multiple Vitamin (MULTIVITAMIN WITH MINERALS) TABS Take 1 tablet by mouth every other day.    10/29/2014  . torsemide (DEMADEX) 20 MG tablet Take 40 mg by mouth daily.  0 10/29/2014  . VENTOLIN HFA 108 (  90 BASE) MCG/ACT inhaler Inhale 2 puffs into the lungs every 4 (four) hours as needed for wheezing or shortness of breath.   0 10/29/2014  . warfarin (COUMADIN) 2 MG tablet Take 2 mg by mouth daily at 6 PM.   0 10/29/2014    Family History  Problem Relation Age of Onset  . Brain cancer Mother   . Aneurysm Father   . Diabetes Sister   . Diabetes Brother   . Diabetes  Sister   . Diabetes Brother   . Heart failure Sister    Review of Systems:     Cardiac Review of Systems: Y or N  Chest Pain [  N  ]  Resting SOB [ N  ] Exertional SOB  [ Y ] Pedal Edema [  Y ]  Palpitations [ N ] Syncope  Aqua.Slicker  ]   Presyncope [  N ]  General Review of Systems: [Y] = yes [  ]=no Constitional: fatigue [ Y ]; nausea Aqua.Slicker  ]; night sweats [ N ]; fever [N  ]; or chills Aqua.Slicker  ]                                              Eye : blurred vision [ N ]; diplopia [  N ]; vision changes [ N ];  Amaurosis fugax[ N ]; Resp: cough [ N ];  wheezing[ N ];  hemoptysis[ N ]; shortness of breath[Y  ]  GI: vomiting[N  ];  melena[N  ];  hematochezia Aqua.Slicker  ]; heartburn[ N ];   GU: kidney stones [ N ]; hematuria[ N ];               Skin:  hair loss[ N ];  or itching[N  ]; Musculosketetal: myalgias[  ];  joint swelling[  ];  joint erythema[  ];  joint pain[  ];  back pain[  ];  Heme/Lymph: anemia[Y  ];  Neuro: TIA[ N ];  ;  stroke[ N ];  vertigo[ N ];  seizures[ N ];   paresthesias[  N];    Psych:depression[N  ]; anxiety[ N ];  Endocrine: diabetes[Y  ];  thyroid dysfunction[N  ];    Physical Exam: BP 103/48 mmHg  Pulse 55  Temp(Src) 97.9 F (36.6 C) (Oral)  Resp 18  Ht '5\' 4"'$  (1.626 m)  Wt 114 lb 15.5 oz (52.148 kg)  BMI 19.72 kg/m2  SpO2 94%   General appearance: alert, cooperative and no distress Head: Normocephalic, without obvious abnormality, atraumatic Neck: no carotid bruit, supple, symmetrical, trachea midline, no JVD Resp: Slightly diminished at bases;no wheezes or rhonchi Cardio: Slightly bradycardic, systolic murmur GI: soft, non-tender; bowel sounds normal; no masses,  no organomegaly Extremities: Bilateral LE edema, worse around ankles. Well healed scar RLE. Feet warm Neurologic: Grossly normal  Diagnostic Studies & Laboratory data:     Recent Radiology Findings:   Dg Chest Port 1 View  10/31/2014   CLINICAL DATA:  Recurrent right pleural effusion. Stage 3 kidney disease.   EXAM: PORTABLE CHEST - 1 VIEW  COMPARISON:  09/28/2014, 09/19/2014 and chest CT 09/19/2014  FINDINGS: Right-sided PleurX catheter unchanged. There is a thin white line over the right apex as this may represent a small residual right pneumothorax, although may represent artifact as there is a similar line over the soft tissues of the right  flank. Evidence of a small amount of bilateral pleural fluid unchanged. Remainder of the lungs are clear. Cardiac silhouette unchanged. Calcified plaque over the aortic arch. Stable large substernal goiter.  IMPRESSION: Stable small bilateral pleural effusions.  Tiny right apical pneumothorax versus artifact. Right PleurX catheter unchanged.   Electronically Signed   By: Marin Olp M.D.   On: 10/31/2014 10:14      Recent Lab Findings: Lab Results  Component Value Date   WBC 4.1 10/30/2014   HGB 11.1* 10/30/2014   HCT 34.4* 10/30/2014   PLT 316 10/30/2014   GLUCOSE 70 11/01/2014   ALT 31 10/30/2014   AST 42* 10/30/2014   NA 142 11/01/2014   K 4.2 11/01/2014   CL 109 11/01/2014   CREATININE 2.34* 11/01/2014   BUN 56* 11/01/2014   CO2 25 11/01/2014   TSH 1.115 08/23/2014   INR 2.36* 11/01/2014   HGBA1C 6.2* 07/27/2014   Assessment / Plan:    1. Persistentt right pleural effusion- S/P right Pleur X catheter on 09/27/2014 by me.  Considering TALC for recurrence of right pleural effusion 2. Large Substernal Goiter 3. Acute on chronic renal insufficiency-Creatinine this am down to 2.44. Baseline Cr appears to be 1.3-1.6. Nephrology to evaluate 4. History of adrenal Insufficiency 5. History of DM-on Tradjenta 6. History of chronic A. Fib-on Coumadin prior to admission. This is on hold and she will be put on IV Heparin. Her INR is up to 5.48. Now decreasing 7. History of right sided breast cancer (stage IIB) in remission. On Arimidex- cytology 8. History of right vocal cord paralysis-ENT to see as outpatient 9. History of DVT RUE-on Coumadin prior to  admission 10. Malnutrition-on feeding supplement and vitamins   Considering talc pleurodesis to decrease fluid and protein loss from pleur ix. With age, poor medical condition and poor cardiac function not candidate for major surgical resection of mediastinal mass     [name] MD      [address] [place] [address] Office [phone]   [place]

## 2014-10-31 NOTE — Progress Notes (Signed)
Subjective:  Resting comfortably in bed, fluids infusing, no new complaints today  Objective:  Vital Signs in the last 24 hours: Temp:  [97.7 F (36.5 C)-98.2 F (36.8 C)] 97.7 F (36.5 C) (08/23 0605) Pulse Rate:  [54-62] 62 (08/23 0605) Resp:  [16-18] 18 (08/23 0605) BP: (85-107)/(40-48) 107/47 mmHg (08/23 0605) SpO2:  [99 %-100 %] 99 % (08/23 0605) Weight:  [49.805 kg (109 lb 12.8 oz)-52.164 kg (115 lb)] 52.164 kg (115 lb) (08/23 0605)  Intake/Output from previous day:     Intake/Output Summary (Last 24 hours) at 10/31/14 2500 Last data filed at 10/31/14 0800  Gross per 24 hour  Intake    120 ml  Output      0 ml  Net    120 ml     Physical Exam: General appearance: alert, cooperative, appears stated age, no distress, appearsfatigued, becoming cachectic  Eyes: negative findings: lids and lashes normal Neck: no carotid bruit, no JVD and supple, symmetrical, trachea midline Resp: decreased breath sounds bilaterally more than halfway at the bases. Chest wall: no tenderness Cardio: S1, S2 normal, no S3 or S4 and systolic murmur: early systolic 3/6, crescendo at lower left sternal border, at apex GI: soft, non-tender; bowel sounds normal; no masses, no organomegaly Extremities: edema 2+, nontender. Full range of motion in extremities. Pulses: No carotid bruit, femoral pulses normal. Pedal pulses faint. Skin: Skin color, texture, turgor normal. No rashes or lesions Neurologic: Grossly normal    Lab Results: BMP  Recent Labs  10/03/14 0327 10/30/14 2013 10/31/14 0742  NA 138 142 145  K 4.0 4.6 3.1*  CL 104 98* 115*  CO2 27 34* 24  GLUCOSE 101* 117* 77  BUN 21* 79* 57*  CREATININE 1.57* 3.74* 2.44*  CALCIUM 9.8 9.6 6.7*  GFRNONAA 30* 11* 18*  GFRAA 35* 12* 21*    CBC  Recent Labs Lab 10/30/14 2013  WBC 4.1  RBC 3.93  HGB 11.1*  HCT 34.4*  PLT 316  MCV 87.5  MCH 28.2  MCHC 32.3  RDW 16.7*    HEMOGLOBIN A1C Lab Results  Component Value Date    HGBA1C 6.2* 07/27/2014   MPG 131 07/27/2014    Cardiac Panel (last 3 results)  Recent Labs  09/20/14 1745 09/21/14 0030 09/21/14 0530  TROPONINI 0.33* 0.30* 0.29*   BNP (last 3 results)  BNP    Component Value Date/Time   BNP 947.8* 09/28/2014 0238   TSH  Recent Labs  07/27/14 0515 08/23/14 0822  TSH 1.036 1.115    CHOLESTEROL No results for input(s): CHOL in the last 8760 hours.  Hepatic Function Panel  Recent Labs  07/26/14 1825  08/24/14 1420  09/26/14 2219 09/29/14 0340 10/30/14 2013  PROT 7.1  < > 6.1*  < > 6.0* 5.7* 6.5  ALBUMIN 3.5  < > 3.0*  < > 3.0* 2.9* 3.3*  AST 38  < > 24  < > 21 16 42*  ALT 33  < > 15  < > 16 12* 31  ALKPHOS 85  < > 65  < > 74 54 85  BILITOT 0.9  < > 0.6  < > 0.7 0.5 1.0  BILIDIR 0.2  --  <0.1*  --   --   --   --   IBILI 0.7  --  NOT CALCULATED  --   --   --   --   < > = values in this interval not displayed.  Imaging: No results found.  Cardiac Studies:  EKG: 09/24/2014: Normal sinus rhythm, left axis deviation, left anterior fascicular block. Poor RV progression, LVH with repolarization abnormality.   Assessment/Plan:  1. Acute on chronic renal insufficiency, stage IV chronic kidney disease, etiology not known. Discussed with Dr. Edrick Oh who feels it is probably related to volume depletion from recurrent pleurocentesis. Patient now admitted for fluid challenge. S. Cr improved with hydration, nephrology on board. 2. Recurrent pleural effusion, exudative type, patient presently has PleuRx catheter. Dr. Servando Snare has been consulted for management of same and to consider further therapy. 3. Paroxysmal atrial fibrillation, intense sinus rhythm. CHA2DS2-VASc Score is 5 with yearly risk of stroke of 6.7% per year. 4. DM-2 controlled.  Recommendation: renal function improving with hydration. D/W Dr. Servando Snare and he plans on intervening on Thursday tentatively. I will administer Vit K and I suspect she probably also has  consumption coagulopathy from recurrent right pleural effusion.   Adrian Prows, MD 10/31/2014, 6:24 PM [facility] [place] Pager: [phone] Office: [phone] If no answer: Cell:  [phone]

## 2014-10-31 NOTE — Patient Outreach (Signed)
Alison Gross) Care [place]  10/31/2014  Alison Gross 09/22/1932 [id number]  CSW noted that patient was admitted into [facility] on August 22nd due to Renal Insufficiency and Stage IV Chronic Kidney Disease.  Patient continues to reside in [place].  CSW attempted  to try and contact patient's husband, Alison Gross today to perform phone assessment, without success.  A HIPAA compliant message was left for patient and Alison Gross on voicemail.  CSW is currently awaiting a return call. According to patient's EMR (Electronic Medical Record) in EP[employer], patient has been diagnosed with Acute on Chronic Renal Insufficiency and Stage IV Chronic Kidney Disease.  It is believed that etiology is result of volume depletion from Recurrent Pleurocentesis, according to Dr. Edrick Gross. Patient is awaiting a nephrology consult.  Patient has also been diagnosed with Recurrent Pleural Effusion, Exudative Type.  Patient presently has PleuRx Catheter. Dr. Servando Gross has been consulted for [place] and further therapy.  Last, patient has diagnosis of Paroxysmal Atrial Fibrillation with Intense Sinus Rhythm. CSW will ensure that patient's RNCM with Alison Gross, Alison Gross is aware of patient's recent [place] admission.  CSW will make contact with patient's Inpatient [place] Social Worker to request that they provide patient and Alison Gross with the following information: List of Medical sales representative [employer] ([place] for [employer]) [place] (Program of [place] for [facility]) Brochure [facility] ([place]) Brochure CSW will continue to follow patient while hospitalized to assess and assist with social work needs and services.  Alison Gross, BSW, MSW, Alison Gross Alison Gross, Alison Gross, [place] [address] [email or web address] [phone]

## 2014-10-31 NOTE — Progress Notes (Signed)
Utilization review completed. Anzley Dibbern, RN, BSN. 

## 2014-10-31 NOTE — Progress Notes (Signed)
Dr.Ganji noted of blood pressure 85/40.Patient asymptomatic  Will continue to monitor .

## 2014-11-01 ENCOUNTER — Inpatient Hospital Stay (HOSPITAL_COMMUNITY): Payer: Commercial Managed Care - HMO

## 2014-11-01 ENCOUNTER — Encounter: Payer: Self-pay | Admitting: *Deleted

## 2014-11-01 DIAGNOSIS — J90 Pleural effusion, not elsewhere classified: Secondary | ICD-10-CM

## 2014-11-01 LAB — PROTIME-INR
INR: 2.36 — AB (ref 0.00–1.49)
INR: 1.53 — AB (ref 0.00–1.49)
PROTHROMBIN TIME: 25.6 s — AB (ref 11.6–15.2)
PROTHROMBIN TIME: 18.4 s — AB (ref 11.6–15.2)

## 2014-11-01 LAB — BASIC METABOLIC PANEL
ANION GAP: 8 (ref 5–15)
BUN: 56 mg/dL — AB (ref 6–20)
CALCIUM: 7.7 mg/dL — AB (ref 8.9–10.3)
CO2: 25 mmol/L (ref 22–32)
CREATININE: 2.34 mg/dL — AB (ref 0.44–1.00)
Chloride: 109 mmol/L (ref 101–111)
GFR calc Af Amer: 22 mL/min — ABNORMAL LOW (ref >60)
GFR, EST NON AFRICAN AMERICAN: 19 mL/min — AB (ref >60)
GLUCOSE: 70 mg/dL (ref 65–99)
Potassium: 4.2 mmol/L (ref 3.5–5.1)
Sodium: 142 mmol/L (ref 135–145)

## 2014-11-01 LAB — GLUCOSE, CAPILLARY
GLUCOSE-CAPILLARY: 101 mg/dL — AB (ref 65–99)
Glucose-Capillary: 82 mg/dL (ref 65–99)
Glucose-Capillary: 112 mg/dL — ABNORMAL HIGH (ref 65–99)
Glucose-Capillary: 105 mg/dL — ABNORMAL HIGH (ref 65–99)

## 2014-11-01 MED ORDER — HEPARIN BOLUS VIA INFUSION
3000.0000 [IU] | Freq: Once | INTRAVENOUS | Status: AC
  Administered 2014-11-01: 3000 [IU] via INTRAVENOUS
  Filled 2014-11-01: qty 3000

## 2014-11-01 MED ORDER — PANTOPRAZOLE SODIUM 40 MG PO TBEC
40.0000 mg | DELAYED_RELEASE_TABLET | Freq: Every day | ORAL | Status: DC
  Administered 2014-11-01 – 2014-11-05 (×5): 40 mg via ORAL
  Filled 2014-11-01 (×5): qty 1

## 2014-11-01 MED ORDER — HEPARIN (PORCINE) IN NACL 100-0.45 UNIT/ML-% IJ SOLN
1000.0000 [IU]/h | INTRAMUSCULAR | Status: DC
  Administered 2014-11-01: 800 [IU]/h via INTRAVENOUS
  Administered 2014-11-02: 1000 [IU]/h via INTRAVENOUS
  Filled 2014-11-01 (×2): qty 250

## 2014-11-01 MED ORDER — CALCIUM CARBONATE-VITAMIN D 500-200 MG-UNIT PO TABS
1.0000 | ORAL_TABLET | Freq: Every day | ORAL | Status: DC
  Administered 2014-11-02 – 2014-11-05 (×4): 1 via ORAL
  Filled 2014-11-01 (×4): qty 1

## 2014-11-01 MED ORDER — FERROUS SULFATE 325 (65 FE) MG PO TABS
325.0000 mg | ORAL_TABLET | Freq: Three times a day (TID) | ORAL | Status: DC
  Administered 2014-11-02 – 2014-11-05 (×9): 325 mg via ORAL
  Filled 2014-11-01 (×9): qty 1

## 2014-11-01 NOTE — Consult Note (Signed)
   [facility] Inpatient Consult   11/01/2014  AFIFA TRUAX 1933/02/06 [id number] Patient is currently active [prior to admission] with [facility] for chronic disease management services.  Patient has been engaged by a [employer] and CSW.  Our community based plan of care has focused on disease management and community resource support.  Patient will receive a post discharge transition of care call and will be evaluated for monthly home visits for assessments and disease process education.  Made Inpatient Case Manager aware that [facility] following. Of note, [facility] Care Management services does not replace or interfere with any services that are arranged by inpatient case management or social work.  For additional questions or referrals please contact: Natividad Brood, RN BSN [facility] Liaison  [phone] business mobile phone

## 2014-11-01 NOTE — Progress Notes (Signed)
COURTESY NOTE:  Alison Gross is back in the hospital with acute on chronic kidney disease and continuing problems regarding her massive thyroid glad. Renal function appears to be improving on IVF. -- I do not believe her M-GUS is related to this but will write for a repeat SPEP to document (light chains have been minimal and stable). As far as breast cancer is concerned, there is no evidence of disease activity (cytology from effusion x 4 negative).  Will follow peripherally. Please let me know if we can be of assistance.   SUMMARY:  81 y.o. [place] woman   (1) status post bilateral mastectomies 05/10/2012, showing (a) on the left side, low-grade ductal carcinoma in situ; earlier biopsy had shown multiple areas of concern, one of which was invasive ductal carcinoma, grade 1, with abundant mucin, estrogen receptor positive, progesterone receptor negative, with an MIB-1 of 10, and no HER-2 amplification.  (b) on the right side, a pT2 pN1, stage IIB, invasive mucinous/ductal carcinoma, grade 1, estrogen and progesterone receptor positive, HER-2 negative, with an MIB-1 of 32%.  (2) opted against adjuvant chemotherapy  (3) adjuvant radiation, completed 08/26/2012  (4) started tamoxifen in late June 2014, discontinued due to coagulation concerns; started anastrozole 11/29/2013  (a) bone density 09/04/2013 showed osteoporosis (b) started yearly zolendronate 02/20/2014  (5) no reconstruction planned  (6) labs show a mildly elevated M-Spike and free lambda light chains, stable, negative bone survey 12/06/2012

## 2014-11-01 NOTE — Progress Notes (Signed)
Subjective: Interval History: no changes. Had some chest tightness, no sob.   Objective: Vital signs in last 24 hours: Temp:  [97.9 F (36.6 C)-98.4 F (36.9 C)] 97.9 F (36.6 C) (08/24 0655) Pulse Rate:  [55-60] 55 (08/24 0655) Resp:  [18] 18 (08/24 0655) BP: (94-105)/(48-52) 103/48 mmHg (08/24 0655) SpO2:  [94 %-100 %] 94 % (08/24 0655) Weight:  [114 lb 15.5 oz (52.148 kg)] 114 lb 15.5 oz (52.148 kg) (08/24 0655) Weight change: 5 lb 2.7 oz (2.343 kg)  Intake/Output from previous day: 08/23 0701 - 08/24 0700 In: 4663.8 [P.O.:720; I.V.:3943.8] Out: 900 [Urine:900] Intake/Output this shift:   General: pleasant female, sitting up. Heent: [place]/at, has hoarseness Lungs: low airmovement at bases Heart: rrr, no m/r/g Ext: trace edema on b/l le's.   Lab Results:  Recent Labs  10/30/14 2013  WBC 4.1  HGB 11.1*  HCT 34.4*  PLT 316   BMET:  Recent Labs  10/31/14 0742 11/01/14 0500  NA 145 142  K 3.1* 4.2  CL 115* 109  CO2 24 25  GLUCOSE 77 70  BUN 57* 56*  CREATININE 2.44* 2.34*  CALCIUM 6.7* 7.7*   No results for input(s): PTH in the last 72 hours. Iron Studies: No results for input(s): IRON, TIBC, TRANSFERRIN, FERRITIN in the last 72 hours. Studies/Results: Dg Chest Port 1 View  10/31/2014   CLINICAL DATA:  Recurrent right pleural effusion. Stage 3 kidney disease.  EXAM: PORTABLE CHEST - 1 VIEW  COMPARISON:  09/28/2014, 09/19/2014 and chest CT 09/19/2014  FINDINGS: Right-sided PleurX catheter unchanged. There is a thin white line over the right apex as this may represent a small residual right pneumothorax, although may represent artifact as there is a similar line over the soft tissues of the right flank. Evidence of a small amount of bilateral pleural fluid unchanged. Remainder of the lungs are clear. Cardiac silhouette unchanged. Calcified plaque over the aortic arch. Stable large substernal goiter.  IMPRESSION: Stable small bilateral pleural effusions.  Tiny right  apical pneumothorax versus artifact. Right PleurX catheter unchanged.   Electronically Signed   By: Marin Olp M.D.   On: 10/31/2014 10:14    I have reviewed the patient's current medications.  Assessment/Plan:  81 yo F with hx of DM II, HTN, large right sided goiter, recurrent pleural effusion s/p R PleuRx tube, pAFib, recent DVT, on coumadin, here with worsening kidney function.  1. AKI on CKD 3 - b/l Crt ~ 1.6. Came in 3.74, was started on IVF. Improving with fluid.  2. Recurrent right sided pleural effusion - 2/2 to large R goiter has R pleurX cath, CT surgery is evaluation for VATS vs TALC. Needs evaluation for R goiter removal.  3. pAfib and recent DVT - on coumadin (was on xarelto before). On amio.  4. Hypokalemia - repleted 5. Hypocalcemia -  6. Grade 2 diastolic dysfunction with preserved EF  7. Iron deficiency anemia - on PO iron - cont. 8. Hypotensive - now better with fluid. May need to hold antihypertensives if BP decreases further.  Plan:  1. Continue IVF and monitor Scr. Avoid nephrotoxins.  3. Rest per primary.   LOS: 2 days   Ahmed, Tasrif 11/01/2014,8:24 AM  I have seen and examined this patient and agree with plan as outlined by Dr. Genene Churn.  She has had low FeNa consistent with pre-renal etiology of AKI/CKD and her Scr has continued to improve after IVF's.  Cont with volume replacement and follow UOP and Daily Scr. Chanz Cahall  A,MD 11/01/2014 11:27 AM

## 2014-11-01 NOTE — Clinical Documentation Improvement (Signed)
Cardiology Internal Medicine  #1:  Can the diagnosis of Malnutrition be further specified?   Document Severity - Severe(third degree), Moderate (second degree), Mild (first degree)  Other condition  Unable to clinically determine  Document any associated diagnoses/conditions  Supporting Information: Current record notes "protein malnutrition", "malnutrition - on feeding supplement and vitamins", height per progress notes 5'4", weight 115 lbs, BMI 19.73.       #2: Can the diagnosis of "grade 2 diastolic dysfunction with preserved EF"  be further specified?  Congestive Heart Failure: Please provide acuity (acute, chronic, acute on chronic) and type (systolic, diastolic, combined systolic and diastolic)  Other  Clinically Undetermined   Please exercise your independent, professional judgment when responding. A specific answer is not anticipated or expected.  Thank you, Mateo Flow, RN [phone] Clinical Documentation Specialist

## 2014-11-01 NOTE — Progress Notes (Signed)
Pleurex catheter to ride side drained. 872m of yellowish clear serous fluid drained. Patient tolerated well.

## 2014-11-01 NOTE — Progress Notes (Signed)
[facility]  Patient Status: Active (receiving services up to time of hospitalization)  [facility] is providing the following services: RN, PT and ST  If patient discharges after hours, please call [phone].   Alison Gross 11/01/2014, 9:49 AM

## 2014-11-01 NOTE — Progress Notes (Signed)
ANTICOAGULATION CONSULT NOTE - Initial Consult  Pharmacy Consult for heparin Indication: atrial fibrillation/recent dvt  No Known Allergies  Patient Measurements: Height: '5\' 4"'$  (162.6 cm) Weight: 114 lb 15.5 oz (52.148 kg) IBW/kg (Calculated) : 54.7  Vital Signs: Temp: 98.9 F (37.2 C) (08/24 1400) Temp Source: Oral (08/24 1400) BP: 100/52 mmHg (08/24 1400) Pulse Rate: 65 (08/24 1400)  Labs:  Recent Labs  10/30/14 2013 10/31/14 0742 10/31/14 2006 11/01/14 0500 11/01/14 1725  HGB 11.1*  --   --   --   --   HCT 34.4*  --   --   --   --   PLT 316  --   --   --   --   LABPROT 34.5* 48.1* 32.6* 25.6* 18.4*  INR 3.52* 5.48* 3.27* 2.36* 1.53*  CREATININE 3.74* 2.44*  --  2.34*  --     Estimated Creatinine Clearance: 16 mL/min (by C-G formula based on Cr of 2.34).   Medical History: Past Medical History  Diagnosis Date  . Hypercholesterolemia     takes Crestor daily  . Thyroid disease     had iodine radiation  . Hypertension     takes Hyzaar daily  . Dysrhythmia     takes Carvedilol daily  . Pneumonia     history of;last time about 4-84yr ago  . GERD (gastroesophageal reflux disease)     takes Protonix daily  . Gastric ulcer   . History of blood transfusion   . History of colon polyps   . Anemia     takes iron pill daily  . Diabetes mellitus without complication     takes Tradjenta daily  . History of radiation therapy 07/12/12-08/26/12    right breast/  . Paroxysmal atrial fibrillation     PCP EKG 09/27/2013: A. Fibrillation.. EKG 09/29/2013: S. Tach.  . Breast cancer 04/12/12    right-pos lymph node/left-DCIS  . Shortness of breath on exertion   . Bruit of left carotid artery   . Acute upper GI bleeding 01/24/2012  . MGUS (monoclonal gammopathy of unknown significance) 04/21/2013  . Osteoporosis 11/29/2013  . Recurrent right pleural effusion 07/26/2014  . Stage III chronic kidney disease 07/26/2014  . Goiter 07/27/2014  . Substernal goiter 12/23/2012  .  Pleural effusion, right 12/23/2012  . Type 2 diabetes mellitus with renal manifestations 07/26/2014  . Dysphonia 09/20/2014  . Vocal cord paralysis     Suspected due to massive substernal goiter  . CHF (congestive heart failure)    Assessment: 89YOF with history of Afib and recent DVT (also h/o breast cancer) on Coumadin PTA.  Patient to transition to IV heparin for possible VATS +/- pleurodesis.   Anticoagulation: Heparin for hx Afib and recent DVT (July 2016).  Originally on Xarelto for Afib, held for thoracentesis, developed DVT and AKI, changed to Coumadin.  Coumadin on hold for possible VATS +/- pleurodesis, INR trend down >> now 1.53 (received 7.'5mg'$  Vit K)  Hematology / Oncology: hx breast cancer post B/L mastectomy - on arimidex, anemia, hgb 11.1, plt wnl  Goal of Therapy:  Heparin level 0.3-0.7 units/ml Monitor platelets by anticoagulation protocol: Yes   Plan:  - Initiate heparin with a bolus of 3000 units followed by an initial infusion of 800 units/hr -HL in 6 hours -Daily HL, CBC (daily HL not ordered yet) Monitor for s/sx of bleeding  MLevester Fresh PharmD, BCPS Clinical Pharmacist Pager [phone]/24/2016 6:23 PM

## 2014-11-01 NOTE — Progress Notes (Signed)
Subjective:  Resting comfortably in bed, had breakfast and feels overall well. C/O chest pain, worse on laying down and improved on sitting and also improved after eating and using inhaler therapy. No palpitation, no other associated symptoms. Has had some nausea since last evening. Objective:  Vital Signs in the last 24 hours: Temp:  [97.9 F (36.6 C)-98.4 F (36.9 C)] 97.9 F (36.6 C) (08/24 0655) Pulse Rate:  [55-60] 55 (08/24 0655) Resp:  [18] 18 (08/24 0655) BP: (94-105)/(48-52) 103/48 mmHg (08/24 0655) SpO2:  [94 %-100 %] 94 % (08/24 0655) Weight:  [52.148 kg (114 lb 15.5 oz)] 52.148 kg (114 lb 15.5 oz) (08/24 0655)  Intake/Output from previous day: 08/23 0701 - 08/24 0700 In: 4663.8 [P.O.:720; I.V.:3943.8] Out: 900 [Urine:900]   Intake/Output Summary (Last 24 hours) at 11/01/14 0856 Last data filed at 11/01/14 0700  Gross per 24 hour  Intake 4543.75 ml  Output    900 ml  Net 3643.75 ml     Physical Exam: General appearance: alert, cooperative, appears stated age, no distress, appearsfatigued, becoming cachectic  Eyes: negative findings: lids and lashes normal Neck: no carotid bruit, no JVD and supple, symmetrical, trachea midline Resp: decreased breath sounds bilaterally more than halfway at the bases. Chest wall: no tenderness Cardio: S1, S2 normal, no S3 or S4 and systolic murmur: early systolic 3/6, crescendo at lower left sternal border, at apex GI: soft, non-tender; bowel sounds normal; no masses, no organomegaly Extremities: edema - trace, nontender. Full range of motion in extremities. Pulses: No carotid bruit, femoral pulses normal. Pedal pulses faint. Skin: Skin color, texture, turgor normal. No rashes or lesions Neurologic: Grossly normal    Lab Results: BMP  Recent Labs  10/30/14 2013 10/31/14 0742 11/01/14 0500  NA 142 145 142  K 4.6 3.1* 4.2  CL 98* 115* 109  CO2 34* 24 25  GLUCOSE 117* 77 70  BUN 79* 57* 56*  CREATININE 3.74* 2.44* 2.34*   CALCIUM 9.6 6.7* 7.7*  GFRNONAA 11* 18* 19*  GFRAA 12* 21* 22*    CBC  Recent Labs Lab 10/30/14 2013  WBC 4.1  RBC 3.93  HGB 11.1*  HCT 34.4*  PLT 316  MCV 87.5  MCH 28.2  MCHC 32.3  RDW 16.7*    HEMOGLOBIN A1C Lab Results  Component Value Date   HGBA1C 6.2* 07/27/2014   MPG 131 07/27/2014    TSH  Recent Labs  07/27/14 0515 08/23/14 0822  TSH 1.036 1.115   Hepatic Function Panel  Recent Labs  07/26/14 1825  08/24/14 1420  09/26/14 2219 09/29/14 0340 10/30/14 2013  PROT 7.1  < > 6.1*  < > 6.0* 5.7* 6.5  ALBUMIN 3.5  < > 3.0*  < > 3.0* 2.9* 3.3*  AST 38  < > 24  < > 21 16 42*  ALT 33  < > 15  < > 16 12* 31  ALKPHOS 85  < > 65  < > 74 54 85  BILITOT 0.9  < > 0.6  < > 0.7 0.5 1.0  BILIDIR 0.2  --  <0.1*  --   --   --   --   IBILI 0.7  --  NOT CALCULATED  --   --   --   --   < > = values in this interval not displayed.  Imaging: Dg Chest Port 1 View  10/31/2014   CLINICAL DATA:  Recurrent right pleural effusion. Stage 3 kidney disease.  EXAM: PORTABLE CHEST -  1 VIEW  COMPARISON:  09/28/2014, 09/19/2014 and chest CT 09/19/2014  FINDINGS: Right-sided PleurX catheter unchanged. There is a thin white line over the right apex as this may represent a small residual right pneumothorax, although may represent artifact as there is a similar line over the soft tissues of the right flank. Evidence of a small amount of bilateral pleural fluid unchanged. Remainder of the lungs are clear. Cardiac silhouette unchanged. Calcified plaque over the aortic arch. Stable large substernal goiter.  IMPRESSION: Stable small bilateral pleural effusions.  Tiny right apical pneumothorax versus artifact. Right PleurX catheter unchanged.   Electronically Signed   By: Marin Olp M.D.   On: 10/31/2014 10:14    Cardiac Studies:  EKG: 09/24/2014: Normal sinus rhythm, left axis deviation, left anterior fascicular block. Poor RV progression, LVH with repolarization abnormality.  Scheduled  Meds: . amiodarone  200 mg Oral BID  . anastrozole  1 mg Oral Daily  . atorvastatin  10 mg Oral q1800  . calcium-vitamin D  1 tablet Oral Q breakfast  . feeding supplement (ENSURE ENLIVE)  237 mL Oral BID BM  . ferrous sulfate  325 mg Oral TID WC  . folic acid  1 mg Oral Daily  . linagliptin  5 mg Oral Daily  . multivitamin with minerals  1 tablet Oral Daily  . thiamine  100 mg Oral Daily   Continuous Infusions: . sodium chloride 125 mL/hr at 11/01/14 0406   PRN Meds:.acetaminophen  Assessment/Plan:  1. Acute on chronic renal insufficiency, stage IV chronic kidney disease, etiology not known.  Patient now admitted for fluid challenge. S. Cr improved with hydration, nephrology on board. 2. Recurrent pleural effusion, exudative type, patient presently has PleuRx catheter. Dr. Servando Snare has been consulted for management of same and to consider further therapy. 3. Paroxysmal atrial fibrillation, maintains sinus rhythm. CHA2DS2-VASc Score is 5 with yearly risk of stroke of 6.7% per year. 4. DM-2 controlled. 5. Supratherapeutic INR. Received 2.5 mg Vit K.   Recommendation: Renal function improving with hydration. D/W Dr. Servando Snare and he plans on intervening on tomorrow tentatively. Chest pain appears GERD, improved with food. Will start PPI for now.  Adrian Prows, MD 11/01/2014, 8:56 AM [facility]. [place] Pager: [phone] Office: [phone] If no answer: Cell:  [phone]

## 2014-11-02 ENCOUNTER — Ambulatory Visit: Payer: Commercial Managed Care - HMO | Admitting: Cardiothoracic Surgery

## 2014-11-02 ENCOUNTER — Other Ambulatory Visit: Payer: Self-pay | Admitting: *Deleted

## 2014-11-02 LAB — BASIC METABOLIC PANEL
Anion gap: 7 (ref 5–15)
BUN: 45 mg/dL — ABNORMAL HIGH (ref 6–20)
CHLORIDE: 112 mmol/L — AB (ref 101–111)
CO2: 25 mmol/L (ref 22–32)
CREATININE: 2.05 mg/dL — AB (ref 0.44–1.00)
Calcium: 8.7 mg/dL — ABNORMAL LOW (ref 8.9–10.3)
GFR calc non Af Amer: 22 mL/min — ABNORMAL LOW (ref >60)
GFR, EST AFRICAN AMERICAN: 25 mL/min — AB (ref >60)
Glucose, Bld: 78 mg/dL (ref 65–99)
Potassium: 4.4 mmol/L (ref 3.5–5.1)
SODIUM: 144 mmol/L (ref 135–145)

## 2014-11-02 LAB — CBC
HCT: 29.8 % — ABNORMAL LOW (ref 36.0–46.0)
Hemoglobin: 10 g/dL — ABNORMAL LOW (ref 12.0–15.0)
MCH: 29 pg (ref 26.0–34.0)
MCHC: 33.6 g/dL (ref 30.0–36.0)
MCV: 86.4 fL (ref 78.0–100.0)
PLATELETS: 155 10*3/uL (ref 150–400)
RBC: 3.45 MIL/uL — AB (ref 3.87–5.11)
RDW: 17 % — ABNORMAL HIGH (ref 11.5–15.5)
WBC: 4.7 10*3/uL (ref 4.0–10.5)

## 2014-11-02 LAB — GLUCOSE, CAPILLARY
GLUCOSE-CAPILLARY: 82 mg/dL (ref 65–99)
GLUCOSE-CAPILLARY: 120 mg/dL — AB (ref 65–99)
Glucose-Capillary: 96 mg/dL (ref 65–99)
Glucose-Capillary: 103 mg/dL — ABNORMAL HIGH (ref 65–99)

## 2014-11-02 LAB — HEPARIN LEVEL (UNFRACTIONATED)
HEPARIN UNFRACTIONATED: 0.52 [IU]/mL (ref 0.30–0.70)
Heparin Unfractionated: 0.66 IU/mL (ref 0.30–0.70)

## 2014-11-02 LAB — PROTIME-INR
INR: 1.62 — ABNORMAL HIGH (ref 0.00–1.49)
Prothrombin Time: 19.2 seconds — ABNORMAL HIGH (ref 11.6–15.2)

## 2014-11-02 MED ORDER — ACETAMINOPHEN 500 MG PO TABS: 500.0000 mg | ORAL_TABLET | Freq: Four times a day (QID) | ORAL | Status: DC | PRN

## 2014-11-02 MED ORDER — TALC 5 G PL SUSR
3.0000 g | Freq: Once | INTRAVENOUS | Status: AC
  Administered 2014-11-02: 3 g via INTRAPLEURAL
  Filled 2014-11-02: qty 3

## 2014-11-02 MED ORDER — ACETAMINOPHEN 500 MG PO TABS
500.0000 mg | ORAL_TABLET | Freq: Four times a day (QID) | ORAL | Status: DC | PRN
  Administered 2014-11-02 – 2014-11-04 (×3): 500 mg via ORAL
  Filled 2014-11-02 (×3): qty 1

## 2014-11-02 MED ORDER — TRAMADOL HCL 50 MG PO TABS
50.0000 mg | ORAL_TABLET | Freq: Two times a day (BID) | ORAL | Status: DC | PRN
  Administered 2014-11-03: 50 mg via ORAL
  Filled 2014-11-02: qty 1

## 2014-11-02 MED ORDER — ONDANSETRON HCL 4 MG/2ML IJ SOLN
4.0000 mg | INTRAMUSCULAR | Status: DC | PRN
  Administered 2014-11-03: 4 mg via INTRAVENOUS
  Filled 2014-11-02: qty 2

## 2014-11-02 MED ORDER — ALBUTEROL SULFATE (2.5 MG/3ML) 0.083% IN NEBU
2.5000 mg | INHALATION_SOLUTION | Freq: Two times a day (BID) | RESPIRATORY_TRACT | Status: DC
  Filled 2014-11-02: qty 3

## 2014-11-02 MED ORDER — ALBUTEROL SULFATE (2.5 MG/3ML) 0.083% IN NEBU
2.5000 mg | INHALATION_SOLUTION | Freq: Four times a day (QID) | RESPIRATORY_TRACT | Status: DC
Start: 2014-11-02 — End: 2014-11-02
  Administered 2014-11-02 (×2): 2.5 mg via RESPIRATORY_TRACT
  Filled 2014-11-02 (×2): qty 3

## 2014-11-02 NOTE — Progress Notes (Signed)
Subjective:  C/O dyspnea and orthopnea. No chest pain.   Objective:  Vital Signs in the last 24 hours: Temp:  [98 F (36.7 C)-98.5 F (36.9 C)] 98.5 F (36.9 C) (08/25 1008) Pulse Rate:  [61-71] 71 (08/25 1933) Resp:  [16-18] 16 (08/25 1933) BP: (100-112)/(45-50) 100/45 mmHg (08/25 1442) SpO2:  [98 %-100 %] 99 % (08/25 1933) Weight:  [53 kg (116 lb 13.5 oz)] 53 kg (116 lb 13.5 oz) (08/25 0554)  Intake/Output from previous day: 08/24 0701 - 08/25 0700 In: 2401.9 [P.O.:840; I.V.:1561.9] Out: 2000 [Urine:1150]   Intake/Output Summary (Last 24 hours) at 11/02/14 1938 Last data filed at 11/02/14 1821  Gross per 24 hour  Intake 516.93 ml  Output    850 ml  Net -333.07 ml     Physical Exam: General appearance: alert, cooperative, appears stated age, no distress, appearsfatigued, becoming cachectic  Eyes: negative findings: lids and lashes normal Neck: no carotid bruit, no JVD and supple, symmetrical, trachea midline Resp: decreased breath sounds bilaterally more than halfway at the bases. Chest wall: no tenderness Cardio: S1, S2 normal, no S3 or S4 and systolic murmur: early systolic 3/6, crescendo at lower left sternal border, at apex GI: soft, non-tender; bowel sounds normal; no masses, no organomegaly Extremities: edema - trace, nontender. Full range of motion in extremities. Pulses: No carotid bruit, femoral pulses normal. Pedal pulses faint. Skin: Skin color, texture, turgor normal. No rashes or lesions Neurologic: Grossly normal    Lab Results: BMP  Recent Labs  10/31/14 0742 11/01/14 0500 11/02/14 0315  NA 145 142 144  K 3.1* 4.2 4.4  CL 115* 109 112*  CO2 '24 25 25  '$ GLUCOSE 77 70 78  BUN 57* 56* 45*  CREATININE 2.44* 2.34* 2.05*  CALCIUM 6.7* 7.7* 8.7*  GFRNONAA 18* 19* 22*  GFRAA 21* 22* 25*    CBC  Recent Labs Lab 11/02/14 0315  WBC 4.7  RBC 3.45*  HGB 10.0*  HCT 29.8*  PLT 155  MCV 86.4  MCH 29.0  MCHC 33.6  RDW 17.0*    HEMOGLOBIN  A1C Lab Results  Component Value Date   HGBA1C 6.2* 07/27/2014   MPG 131 07/27/2014    TSH  Recent Labs  07/27/14 0515 08/23/14 0822  TSH 1.036 1.115   Hepatic Function Panel  Recent Labs  07/26/14 1825  08/24/14 1420  09/26/14 2219 09/29/14 0340 10/30/14 2013  PROT 7.1  < > 6.1*  < > 6.0* 5.7* 6.5  ALBUMIN 3.5  < > 3.0*  < > 3.0* 2.9* 3.3*  AST 38  < > 24  < > 21 16 42*  ALT 33  < > 15  < > 16 12* 31  ALKPHOS 85  < > 65  < > 74 54 85  BILITOT 0.9  < > 0.6  < > 0.7 0.5 1.0  BILIDIR 0.2  --  <0.1*  --   --   --   --   IBILI 0.7  --  NOT CALCULATED  --   --   --   --   < > = values in this interval not displayed.  Imaging: Dg Chest 2 View  11/01/2014   CLINICAL DATA:  Shortness of breath, pleural effusion, history of breast cancer status post bilateral mastectomy, pneumonia  EXAM: CHEST  2 VIEW  COMPARISON:  Chest radiographs dated 10/31/2014. CT chest dated 09/19/2014.  FINDINGS: Medial right upper lobe mass.  Small bilateral pleural effusions, left greater than right.  No pneumothorax.  Cardiomegaly.  Status post bilateral mastectomy. Surgical clips overlying the bilateral chest wall and right axilla.  IMPRESSION: Medial right upper lobe mass.  Small bilateral pleural effusions, left greater than right.  No pneumothorax is seen.   Electronically Signed   By: Julian Hy M.D.   On: 11/01/2014 11:25    Cardiac Studies:  EKG: 09/24/2014: Normal sinus rhythm, left axis deviation, left anterior fascicular block. Poor RV progression, LVH with repolarization abnormality.  Scheduled Meds: . albuterol  2.5 mg Nebulization Q6H  . amiodarone  200 mg Oral BID  . anastrozole  1 mg Oral Daily  . atorvastatin  10 mg Oral q1800  . calcium-vitamin D  1 tablet Oral Q breakfast  . feeding supplement (ENSURE ENLIVE)  237 mL Oral BID BM  . ferrous sulfate  325 mg Oral TID WC  . folic acid  1 mg Oral Daily  . linagliptin  5 mg Oral Daily  . multivitamin with minerals  1 tablet  Oral Daily  . pantoprazole  40 mg Oral Q0600  . thiamine  100 mg Oral Daily   Continuous Infusions: . heparin 1,000 Units/hr (11/02/14 1830)   PRN Meds:.acetaminophen, ondansetron (ZOFRAN) IV, traMADol  Assessment/Plan:  1. Acute on chronic renal insufficiency, stage IV chronic kidney disease, etiology not known.  Patient now admitted for fluid challenge. S. Cr improved with hydration, nephrology on board. 2. Recurrent pleural effusion, exudative type, patient presently has PleuRx catheter. Dr. Servando Snare has been consulted for management of same and to consider further therapy. 3. Paroxysmal atrial fibrillation, maintains sinus rhythm. CHA2DS2-VASc Score is 5 with yearly risk of stroke of 6.7% per year. 4. DM-2 controlled. 5. Long term anticoagulation for A. Fib 6. Acute on chronic diastolic heart failure due to excessive fluid bolus. 7. Mild malnutrion.  Recommendation: Renal function improving with hydration. S/P talc pleurodesis by Dr. Servando Snare. Probably discharge tomorrow if everyone is agreeable. Discontinue fluids (late entry from rounding at 9am).  Alison Prows, MD 11/02/2014, 7:38 PM [facility]. [place] Pager: [phone] Office: [phone] If no answer: Cell:  [phone]

## 2014-11-02 NOTE — Progress Notes (Signed)
[address]       [place] [address]             [phone]                      LOS: 3 days   Subjective: Feels ok today  Objective: Vital signs in last 24 hours: Patient Vitals for the past 24 hrs:  BP Temp Temp src Pulse Resp SpO2 Weight  11/02/14 1442 (!) 100/45 mmHg - - 67 - 99 % -  11/02/14 1335 - - - - - 100 % -  11/02/14 1008 (!) 112/50 mmHg 98.5 F (36.9 C) Oral 62 18 100 % -  11/02/14 0554 (!) 111/49 mmHg 98.1 F (36.7 C) Oral 68 18 98 % 116 lb 13.5 oz (53 kg)  11/01/14 2051 (!) 106/50 mmHg 98 F (36.7 C) Oral 61 18 98 % -    Filed Weights   10/31/14 0605 11/01/14 0655 11/02/14 0554  Weight: 115 lb (52.164 kg) 114 lb 15.5 oz (52.148 kg) 116 lb 13.5 oz (53 kg)    Hemodynamic parameters for last 24 hours:    Intake/Output from previous day: 08/24 0701 - 08/25 0700 In: 2401.9 [P.O.:840; I.V.:1561.9] Out: 2000 [Urine:1150] Intake/Output this shift: Total I/O In: 240 [P.O.:240] Out: 200 [Urine:200]  Scheduled Meds: . albuterol  2.5 mg Nebulization Q6H  . amiodarone  200 mg Oral BID  . anastrozole  1 mg Oral Daily  . atorvastatin  10 mg Oral q1800  . calcium-vitamin D  1 tablet Oral Q breakfast  . feeding supplement (ENSURE ENLIVE)  237 mL Oral BID BM  . ferrous sulfate  325 mg Oral TID WC  . folic acid  1 mg Oral Daily  . linagliptin  5 mg Oral Daily  . multivitamin with minerals  1 tablet Oral Daily  . pantoprazole  40 mg Oral Q0600  . thiamine  100 mg Oral Daily   Continuous Infusions: . heparin 1,000 Units/hr (11/02/14 0436)   PRN Meds:.acetaminophen, ondansetron (ZOFRAN) IV, traMADol  General appearance: alert and cooperative Neurologic: intact Heart: irregularly irregular rhythm Lungs: diminished breath sounds bibasilar Abdomen: soft, non-tender; bowel sounds normal; no masses,  no organomegaly Extremities: extremities normal, atraumatic, no cyanosis or edema and Homans sign is negative, no sign of DVT  Lab  Results: CBC: Recent Labs  10/30/14 2013 11/02/14 0315  WBC 4.1 4.7  HGB 11.1* 10.0*  HCT 34.4* 29.8*  PLT 316 155   BMET:  Recent Labs  11/01/14 0500 11/02/14 0315  NA 142 144  K 4.2 4.4  CL 109 112*  CO2 25 25  GLUCOSE 70 78  BUN 56* 45*  CREATININE 2.34* 2.05*  CALCIUM 7.7* 8.7*    PT/INR:  Recent Labs  11/02/14 0315  LABPROT 19.2*  INR 1.62*     Radiology Dg Chest 2 View  11/01/2014   CLINICAL DATA:  Shortness of breath, pleural effusion, history of breast cancer status post bilateral mastectomy, pneumonia  EXAM: CHEST  2 VIEW  COMPARISON:  Chest radiographs dated 10/31/2014. CT chest dated 09/19/2014.  FINDINGS: Medial right upper lobe mass.  Small bilateral pleural effusions, left greater than right.  No pneumothorax.  Cardiomegaly.  Status post bilateral mastectomy. Surgical clips overlying the bilateral chest wall and right axilla.  IMPRESSION: Medial right upper lobe mass.  Small bilateral pleural effusions, left greater than right.  No pneumothorax is seen.   Electronically Signed  By: Julian Hy M.D.   On: 11/01/2014 11:25     Assessment/Plan: This am Again discussed with patient the use of Sterile talc to try to decrease the drainage from pleurix. She was agreeable with this and was placed and tolerated  will, Follow up chest xray in am.  [name] MD 11/02/2014 5:05 PM

## 2014-11-02 NOTE — Progress Notes (Signed)
Patient informed per Dr. Servando Snare, [facility] slurry to be placed via right Pleur X catheter. Patient informed may burn and cause a fever.TALC slurry placed in the right Pleur X catheter. Patient tolerated procedure well.

## 2014-11-02 NOTE — Progress Notes (Signed)
Patient ID: Alison Gross, female   DOB: 1933/03/03, 81 y.o.   MRN: [id number] S:had some sob this morning and now off of IVF's O:BP 112/50 mmHg  Pulse 62  Temp(Src) 98.5 F (36.9 C) (Oral)  Resp 18  Ht '5\' 4"'$  (1.626 m)  Wt 53 kg (116 lb 13.5 oz)  BMI 20.05 kg/m2  SpO2 100%  Intake/Output Summary (Last 24 hours) at 11/02/14 1229 Last data filed at 11/02/14 1003  Gross per 24 hour  Intake 2401.93 ml  Output   1150 ml  Net 1251.93 ml   Intake/Output: I/O last 3 completed shifts: In: 6465.7 [P.O.:960; I.V.:5505.7] Out: 2300 [Urine:1450; Other:850]  Intake/Output this shift:    Weight change: 0.852 kg (1 lb 14.1 oz) Gen:WD thin AAF in NAD CVS:no rub Resp:decreased BS on Right base WVP:XTGGYI Ext:1+ pedal edema   Recent Labs Lab 10/30/14 2013 10/31/14 0742 11/01/14 0500 11/02/14 0315  NA 142 145 142 144  K 4.6 3.1* 4.2 4.4  CL 98* 115* 109 112*  CO2 34* '24 25 25  '$ GLUCOSE 117* 77 70 78  BUN 79* 57* 56* 45*  CREATININE 3.74* 2.44* 2.34* 2.05*  ALBUMIN 3.3*  --   --   --   CALCIUM 9.6 6.7* 7.7* 8.7*  AST 42*  --   --   --   ALT 31  --   --   --    Liver Function Tests:  Recent Labs Lab 10/30/14 2013  AST 42*  ALT 31  ALKPHOS 85  BILITOT 1.0  PROT 6.5  ALBUMIN 3.3*   No results for input(s): LIPASE, AMYLASE in the last 168 hours. No results for input(s): AMMONIA in the last 168 hours. CBC:  Recent Labs Lab 10/30/14 2013 11/02/14 0315  WBC 4.1 4.7  HGB 11.1* 10.0*  HCT 34.4* 29.8*  MCV 87.5 86.4  PLT 316 155   Cardiac Enzymes: No results for input(s): CKTOTAL, CKMB, CKMBINDEX, TROPONINI in the last 168 hours. CBG:  Recent Labs Lab 11/01/14 1131 11/01/14 1653 11/01/14 2109 11/02/14 0620 11/02/14 1138  GLUCAP 112* 105* 101* 82 120*    Iron Studies: No results for input(s): IRON, TIBC, TRANSFERRIN, FERRITIN in the last 72 hours. Studies/Results: Dg Chest 2 View  11/01/2014   CLINICAL DATA:  Shortness of breath, pleural effusion, history  of breast cancer status post bilateral mastectomy, pneumonia  EXAM: CHEST  2 VIEW  COMPARISON:  Chest radiographs dated 10/31/2014. CT chest dated 09/19/2014.  FINDINGS: Medial right upper lobe mass.  Small bilateral pleural effusions, left greater than right.  No pneumothorax.  Cardiomegaly.  Status post bilateral mastectomy. Surgical clips overlying the bilateral chest wall and right axilla.  IMPRESSION: Medial right upper lobe mass.  Small bilateral pleural effusions, left greater than right.  No pneumothorax is seen.   Electronically Signed   By: Julian Hy M.D.   On: 11/01/2014 11:25   . albuterol  2.5 mg Nebulization Q6H  . amiodarone  200 mg Oral BID  . anastrozole  1 mg Oral Daily  . atorvastatin  10 mg Oral q1800  . calcium-vitamin D  1 tablet Oral Q breakfast  . feeding supplement (ENSURE ENLIVE)  237 mL Oral BID BM  . ferrous sulfate  325 mg Oral TID WC  . folic acid  1 mg Oral Daily  . linagliptin  5 mg Oral Daily  . multivitamin with minerals  1 tablet Oral Daily  . pantoprazole  40 mg Oral Q0600  . thiamine  100 mg Oral Daily    BMET    Component Value Date/Time   NA 144 11/02/2014 0315   NA 143 09/18/2014 0946   K 4.4 11/02/2014 0315   K 4.0 09/18/2014 0946   CL 112* 11/02/2014 0315   CL 107 08/05/2012 1009   CO2 25 11/02/2014 0315   CO2 30* 09/18/2014 0946   GLUCOSE 78 11/02/2014 0315   GLUCOSE 94 09/18/2014 0946   GLUCOSE 128* 08/05/2012 1009   BUN 45* 11/02/2014 0315   BUN 31.2* 09/18/2014 0946   CREATININE 2.05* 11/02/2014 0315   CREATININE 1.7* 09/18/2014 0946   CALCIUM 8.7* 11/02/2014 0315   CALCIUM 11.8* 09/18/2014 0946   CALCIUM 10.7* 07/27/2014 0830   GFRNONAA 22* 11/02/2014 0315   GFRAA 25* 11/02/2014 0315   CBC    Component Value Date/Time   WBC 4.7 11/02/2014 0315   WBC 4.5 09/18/2014 0946   RBC 3.45* 11/02/2014 0315   RBC 3.34* 09/18/2014 0946   RBC 2.80* 01/24/2012 1011   HGB 10.0* 11/02/2014 0315   HGB 9.6* 09/18/2014 0946   HCT  29.8* 11/02/2014 0315   HCT 29.4* 09/18/2014 0946   PLT 155 11/02/2014 0315   PLT 319 09/18/2014 0946   MCV 86.4 11/02/2014 0315   MCV 87.9 09/18/2014 0946   MCH 29.0 11/02/2014 0315   MCH 28.7 09/18/2014 0946   MCHC 33.6 11/02/2014 0315   MCHC 32.7 09/18/2014 0946   RDW 17.0* 11/02/2014 0315   RDW 16.8* 09/18/2014 0946   LYMPHSABS 0.6* 09/19/2014 0750   LYMPHSABS 0.6* 09/18/2014 0946   MONOABS 0.8 09/19/2014 0750   MONOABS 0.6 09/18/2014 0946   EOSABS 0.5 09/19/2014 0750   EOSABS 0.3 09/18/2014 0946   BASOSABS 0.1 09/19/2014 0750   BASOSABS 0.1 09/18/2014 0946     81 yo F with hx of DM II, HTN, large right sided goiter, recurrent pleural effusion s/p R PleuRx tube, pAFib, recent DVT, on coumadin, here with worsening kidney function.  1. AKI on CKD 3 - (baseline Scr ~ 1.6). Came in 3.74, low feNa and was started on IVF. Improving with fluid but had to be discontinued due to SOB this am.  follow off of IVF's 2. Recurrent right sided pleural effusion - 2/2 to large R goiter has R pleurX cath, CT surgery is evaluation for VATS vs TALC. (apparently not a candidate for R goiter removal).  3. pAfib and recent DVT - on coumadin (was on xarelto before). On amio.  4. Hypokalemia - repleted 5. Hypocalcemia -  6. Grade 2 diastolic dysfunction with preserved EF  7. Iron deficiency anemia - on PO iron - cont. 8. Hypotensive - now better with fluid. May need to hold antihypertensives if BP decreases further.  Joppa A

## 2014-11-02 NOTE — Care Management Important Message (Signed)
Important Message  Patient Details  Name: Alison Gross MRN: [id number] Date of Birth: Sep 16, 1932   Medicare Important Message Given:  Yes-second notification given    Pricilla Handler 11/02/2014, 12:08 PM

## 2014-11-02 NOTE — Progress Notes (Signed)
ANTICOAGULATION CONSULT NOTE - Follow-up Consult  Pharmacy Consult for heparin Indication: atrial fibrillation/recent dvt  No Known Allergies  Patient Measurements: Height: '5\' 4"'$  (162.6 cm) Weight: 114 lb 15.5 oz (52.148 kg) IBW/kg (Calculated) : 54.7  Vital Signs: Temp: 98 F (36.7 C) (08/24 2051) Temp Source: Oral (08/24 2051) BP: 106/50 mmHg (08/24 2051) Pulse Rate: 61 (08/24 2051)  Labs:  Recent Labs  10/30/14 2013 10/31/14 0742  11/01/14 0500 11/01/14 1725 11/02/14 0315  HGB 11.1*  --   --   --   --  10.0*  HCT 34.4*  --   --   --   --  29.8*  PLT 316  --   --   --   --  155  LABPROT 34.5* 48.1*  < > 25.6* 18.4* 19.2*  INR 3.52* 5.48*  < > 2.36* 1.53* 1.62*  HEPARINUNFRC  --   --   --   --   --  <0.10*  CREATININE 3.74* 2.44*  --  2.34*  --   --   < > = values in this interval not displayed.  Estimated Creatinine Clearance: 16 mL/min (by C-G formula based on Cr of 2.34).  Assessment: 81 YOF on IV heparin bridge while coumadin on hold for possible VATS +/- pleurodesis. INR 1.62. Heparin level undetectable on 800 units/hr. No issues with infusion or bleeding per RN. Hgb down a bit. Plt down to 155 - watch.  Goal of Therapy:  Heparin level 0.3-0.7 units/ml Monitor platelets by anticoagulation protocol: Yes   Plan:  -Increase heparin gtt to 1000 units/hr -F/u heparin level in 8 hours  Sherlon Handing, PharmD, BCPS Clinical pharmacist, pager [phone] 11/02/2014 4:23 AM

## 2014-11-02 NOTE — Progress Notes (Addendum)
ANTICOAGULATION CONSULT NOTE - Follow Up Consult  Pharmacy Consult for Heparin Indication: recent PE  No Known Allergies  Patient Measurements: Height: '5\' 4"'$  (162.6 cm) Weight: 116 lb 13.5 oz (53 kg) IBW/kg (Calculated) : 54.7 Heparin Dosing Weight: 53 kg  Vital Signs: Temp: 98.5 F (36.9 C) (08/25 1008) Temp Source: Oral (08/25 1008) BP: 112/50 mmHg (08/25 1008) Pulse Rate: 62 (08/25 1008)  Labs:  Recent Labs  10/30/14 2013 10/31/14 0742  11/01/14 0500 11/01/14 1725 11/02/14 0315 11/02/14 1155  HGB 11.1*  --   --   --   --  10.0*  --   HCT 34.4*  --   --   --   --  29.8*  --   PLT 316  --   --   --   --  155  --   LABPROT 34.5* 48.1*  < > 25.6* 18.4* 19.2*  --   INR 3.52* 5.48*  < > 2.36* 1.53* 1.62*  --   HEPARINUNFRC  --   --   --   --   --  <0.10* 0.52  CREATININE 3.74* 2.44*  --  2.34*  --  2.05*  --   < > = values in this interval not displayed.  Estimated Creatinine Clearance: 18.6 mL/min (by C-G formula based on Cr of 2.05).   Assessment: 62 YOF with history of Afib and recent DVT (also h/o breast cancer) on Coumadin PTA.  Patient to transition to IV heparin for possible VATS +/- pleurodesis  Anticoagulation: Heparin for hx Afib and recent DVT (July 2016). Originally on Xarelto for Afib, held for thoracentesis, developed DVT and AKI, changed to Coumadin.  Coumadin on hold for possible VATS +/- pleurodesis, INR trend down >> now 1.53>1.62 (received 7.'5mg'$  Vit K). Heparin level 0.52 now in goal. Plts since admission 316>155 (72hrs)  Goal of Therapy:  Heparin level 0.3-0.7 units/ml Monitor platelets by anticoagulation protocol: Yes   Plan:  -Continue IV heparin at 1000 units/hr - Will re-confirm level in 6 hrs. - F/u Plans for VATS   Crystal S. Alford Highland, PharmD, BCPS Clinical Staff Pharmacist Pager [phone]  Eilene Ghazi Stillinger 11/02/2014,1:40 PM  ______________________________________________________ Jones Skene: Heparin level remains  therapeutic, now at 0.66.  No bleeding or other issues reported.  Goal of Therapy:  Heparin level 0.3-0.7 units/ml Monitor platelets by anticoagulation protocol: Yes   Plan:  -Continue heparin drip at 1000 units/hr -Monitor daily HL, CBC, s/sx of bleeding -F/u plans for VATS  Drucie Opitz, PharmD Clinical Pharmacist Pager: [phone] 11/02/2014 6:27 PM

## 2014-11-03 ENCOUNTER — Inpatient Hospital Stay (HOSPITAL_COMMUNITY): Payer: Commercial Managed Care - HMO

## 2014-11-03 ENCOUNTER — Encounter (HOSPITAL_COMMUNITY): Payer: Self-pay | Admitting: *Deleted

## 2014-11-03 LAB — CBC
HEMATOCRIT: 30.5 % — AB (ref 36.0–46.0)
HEMOGLOBIN: 9.8 g/dL — AB (ref 12.0–15.0)
MCH: 28.3 pg (ref 26.0–34.0)
MCHC: 32.1 g/dL (ref 30.0–36.0)
MCV: 88.2 fL (ref 78.0–100.0)
Platelets: 253 10*3/uL (ref 150–400)
RBC: 3.46 MIL/uL — AB (ref 3.87–5.11)
RDW: 17 % — ABNORMAL HIGH (ref 11.5–15.5)
WBC: 6.7 10*3/uL (ref 4.0–10.5)

## 2014-11-03 LAB — PROTEIN ELECTROPHORESIS, SERUM
A/G Ratio: 0.9 (ref 0.7–1.7)
ALPHA-1-GLOBULIN: 0.3 g/dL (ref 0.0–0.4)
ALPHA-2-GLOBULIN: 0.6 g/dL (ref 0.4–1.0)
Albumin ELP: 2.5 g/dL — ABNORMAL LOW (ref 2.9–4.4)
Beta Globulin: 0.7 g/dL (ref 0.7–1.3)
GAMMA GLOBULIN: 1.1 g/dL (ref 0.4–1.8)
GLOBULIN, TOTAL: 2.7 g/dL (ref 2.2–3.9)
M-SPIKE, %: 0.7 g/dL — AB
TOTAL PROTEIN ELP: 5.2 g/dL — AB (ref 6.0–8.5)

## 2014-11-03 LAB — GLUCOSE, CAPILLARY
GLUCOSE-CAPILLARY: 123 mg/dL — AB (ref 65–99)
GLUCOSE-CAPILLARY: 129 mg/dL — AB (ref 65–99)
GLUCOSE-CAPILLARY: 119 mg/dL — AB (ref 65–99)
Glucose-Capillary: 123 mg/dL — ABNORMAL HIGH (ref 65–99)

## 2014-11-03 LAB — BASIC METABOLIC PANEL
ANION GAP: 7 (ref 5–15)
BUN: 38 mg/dL — ABNORMAL HIGH (ref 6–20)
CALCIUM: 8.9 mg/dL (ref 8.9–10.3)
CO2: 23 mmol/L (ref 22–32)
Chloride: 112 mmol/L — ABNORMAL HIGH (ref 101–111)
Creatinine, Ser: 1.91 mg/dL — ABNORMAL HIGH (ref 0.44–1.00)
GFR, EST AFRICAN AMERICAN: 28 mL/min — AB (ref >60)
GFR, EST NON AFRICAN AMERICAN: 24 mL/min — AB (ref >60)
GLUCOSE: 125 mg/dL — AB (ref 65–99)
POTASSIUM: 4.5 mmol/L (ref 3.5–5.1)
Sodium: 142 mmol/L (ref 135–145)

## 2014-11-03 LAB — KAPPA/LAMBDA LIGHT CHAINS
KAPPA FREE LGHT CHN: 26.36 mg/L — AB (ref 3.30–19.40)
Kappa, lambda light chain ratio: 0.17 — ABNORMAL LOW (ref 0.26–1.65)
LAMDA FREE LIGHT CHAINS: 157.14 mg/L — AB (ref 5.71–26.30)

## 2014-11-03 LAB — PROTIME-INR
INR: 1.62 — AB (ref 0.00–1.49)
PROTHROMBIN TIME: 19.2 s — AB (ref 11.6–15.2)

## 2014-11-03 LAB — HEPARIN LEVEL (UNFRACTIONATED): HEPARIN UNFRACTIONATED: 0.58 [IU]/mL (ref 0.30–0.70)

## 2014-11-03 MED ORDER — IPRATROPIUM-ALBUTEROL 0.5-2.5 (3) MG/3ML IN SOLN
3.0000 mL | RESPIRATORY_TRACT | Status: DC | PRN
  Administered 2014-11-03: 3 mL via RESPIRATORY_TRACT

## 2014-11-03 MED ORDER — WARFARIN SODIUM 4 MG PO TABS
4.0000 mg | ORAL_TABLET | Freq: Once | ORAL | Status: AC
  Administered 2014-11-03: 4 mg via ORAL
  Filled 2014-11-03: qty 1

## 2014-11-03 MED ORDER — IPRATROPIUM-ALBUTEROL 0.5-2.5 (3) MG/3ML IN SOLN
RESPIRATORY_TRACT | Status: AC
  Filled 2014-11-03: qty 3

## 2014-11-03 MED ORDER — WARFARIN - PHARMACIST DOSING INPATIENT: Freq: Every day | Status: DC

## 2014-11-03 MED ORDER — ALBUTEROL SULFATE (2.5 MG/3ML) 0.083% IN NEBU: 2.5000 mg | INHALATION_SOLUTION | Freq: Four times a day (QID) | RESPIRATORY_TRACT | Status: DC | PRN

## 2014-11-03 NOTE — Progress Notes (Signed)
Patient alert and oriented x4 this evening, vital signs stable.  IV heparin discontinued per telephone order from Dr. Einar Gip and home dose of warfarin restarted.  Pharmacist, Ysidro Evert, made aware.  Patient and family made aware of plan to discharge tomorrow per Dr. Einar Gip, patient and family verbalize understanding and agreement.  Patient and family deny any questions or concerns at this time, report given to night shift RN.

## 2014-11-03 NOTE — Progress Notes (Signed)
pleurex drained.  Obtained 950cc of amber colored fluid.  Pt tolerated well. Mild discomfort afterwards which subsided.  Due to be drained on 11/05/2014. Normally drains on Mon, wed, Friday, and Saturday due to nurse not coming out on Sunday's.

## 2014-11-03 NOTE — Progress Notes (Signed)
ANTICOAGULATION CONSULT NOTE - Follow Up Consult  Pharmacy Consult for Heparin Indication: recent PE  No Known Allergies  Patient Measurements: Height: '5\' 4"'$  (162.6 cm) Weight: 124 lb 14.4 oz (56.654 kg) IBW/kg (Calculated) : 54.7 Heparin Dosing Weight: 53 kg  Vital Signs: Temp: 99.8 F (37.7 C) (08/26 0828) Temp Source: Oral (08/26 0828) BP: 95/42 mmHg (08/26 0828) Pulse Rate: 76 (08/26 0828)  Labs:  Recent Labs  11/01/14 0500 11/01/14 1725  11/02/14 0315 11/02/14 1155 11/02/14 1746 11/03/14 0452  HGB  --   --   --  10.0*  --   --  9.8*  HCT  --   --   --  29.8*  --   --  30.5*  PLT  --   --   --  155  --   --  253  LABPROT 25.6* 18.4*  --  19.2*  --   --  19.2*  INR 2.36* 1.53*  --  1.62*  --   --  1.62*  HEPARINUNFRC  --   --   < > <0.10* 0.52 0.66 0.58  CREATININE 2.34*  --   --  2.05*  --   --  1.91*  < > = values in this interval not displayed.  Estimated Creatinine Clearance: 20.6 mL/min (by C-G formula based on Cr of 1.91).   Assessment: 81yo female with history of Afib and recent DVT (July 2016) and h/o breast cancer, on Coumadin PTA.  Patient currently on IV heparin infusion, now s/p talc pleurodesis.  Dr. Einar Gip has ordered pharmacy to resume coumadin. Heparin level remained therapeutic today at 0.58, H&H stable, PLT have increased from yesterday (301>601>093), no s/sx of bleeding. Coumadin held on admit 10/30/14 for possible VATS +/- pleurodesis. INR on 8/22 was 3.52. She received 7.'5mg'$  Vit K on 10/31/14.  INR today = 1.62    Past history: originally on Xarelto for Afib, held for thoracentesis, developed DVT and AKI, changed to Coumadin.  PTA coumadin dose: 2 mg daily , last taken on 10/29/14 PTA. On amiodarone 200 mg po BID which continues.    Goal of Therapy:  Heparin level 0.3-0.7 units/ml Monitor platelets by anticoagulation protocol: Yes  INR = 2-3   Plan:  - Coumadin 4 mg po tonight  -Continue IV heparin at 1000 units/hr -Monitor daily HL, CBC,  s/sx of bleeding   Nicole Cella, RPh Clinical Pharmacist Pager: [phone] 11/03/2014 5:12 PM

## 2014-11-03 NOTE — Progress Notes (Signed)
Subjective:  C/O dyspnea and orthopnea. No chest pain. Good UOP without diuretics. Coumadin needs to be resumed.   Objective:  Vital Signs in the last 24 hours: Temp:  [98.2 F (36.8 C)-99.8 F (37.7 C)] 99.8 F (37.7 C) (08/26 0828) Pulse Rate:  [70-84] 76 (08/26 0828) Resp:  [16-20] 18 (08/26 0828) BP: (95-132)/(42-57) 95/42 mmHg (08/26 0828) SpO2:  [99 %-100 %] 100 % (08/26 0828) Weight:  [56.654 kg (124 lb 14.4 oz)] 56.654 kg (124 lb 14.4 oz) (08/26 0500)  Intake/Output from previous day: 08/25 0701 - 08/26 0700 In: 708.8 [P.O.:480; I.V.:228.8] Out: 750 [Urine:750]   Intake/Output Summary (Last 24 hours) at 11/03/14 1654 Last data filed at 11/03/14 1613  Gross per 24 hour  Intake 1220.97 ml  Output   1500 ml  Net -279.03 ml     Physical Exam: General appearance: alert, cooperative, appears stated age, no distress, appearsfatigued, becoming cachectic  Eyes: negative findings: lids and lashes normal Neck: no carotid bruit, no JVD and supple, symmetrical, trachea midline Resp: decreased breath sounds bilaterally more than halfway at the bases. Chest wall: no tenderness Cardio: S1, S2 normal, no S3 or S4 and systolic murmur: early systolic 3/6, crescendo at lower left sternal border, at apex GI: soft, non-tender; bowel sounds normal; no masses, no organomegaly Extremities: edema - trace, nontender. Full range of motion in extremities. Pulses: No carotid bruit, femoral pulses normal. Pedal pulses faint. Skin: Skin color, texture, turgor normal. No rashes or lesions Neurologic: Grossly normal    Lab Results: BMP  Recent Labs  11/01/14 0500 11/02/14 0315 11/03/14 0452  NA 142 144 142  K 4.2 4.4 4.5  CL 109 112* 112*  CO2 '25 25 23  '$ GLUCOSE 70 78 125*  BUN 56* 45* 38*  CREATININE 2.34* 2.05* 1.91*  CALCIUM 7.7* 8.7* 8.9  GFRNONAA 19* 22* 24*  GFRAA 22* 25* 28*    CBC  Recent Labs Lab 11/03/14 0452  WBC 6.7  RBC 3.46*  HGB 9.8*  HCT 30.5*  PLT 253   MCV 88.2  MCH 28.3  MCHC 32.1  RDW 17.0*    HEMOGLOBIN A1C Lab Results  Component Value Date   HGBA1C 6.2* 07/27/2014   MPG 131 07/27/2014    TSH  Recent Labs  07/27/14 0515 08/23/14 0822  TSH 1.036 1.115   Hepatic Function Panel  Recent Labs  07/26/14 1825  08/24/14 1420  09/26/14 2219 09/29/14 0340 10/30/14 2013  PROT 7.1  < > 6.1*  < > 6.0* 5.7* 6.5  ALBUMIN 3.5  < > 3.0*  < > 3.0* 2.9* 3.3*  AST 38  < > 24  < > 21 16 42*  ALT 33  < > 15  < > 16 12* 31  ALKPHOS 85  < > 65  < > 74 54 85  BILITOT 0.9  < > 0.6  < > 0.7 0.5 1.0  BILIDIR 0.2  --  <0.1*  --   --   --   --   IBILI 0.7  --  NOT CALCULATED  --   --   --   --   < > = values in this interval not displayed.  Imaging: Dg Chest 2 View  11/03/2014   CLINICAL DATA:  Increase shortness of Breath  EXAM: CHEST - 2 VIEW  COMPARISON:  11/01/2014  FINDINGS: Cardiac shadow remains enlarged. A right-sided pleural effusion is again identified and has increased in the interval from the prior exam. A PleurX catheter  is again seen. A large thyroid goiter extending into the right superior mediastinum is noted. Deviation trachea is again seen and stable. A small left pleural effusion is stable.  IMPRESSION: Increasing right-sided pleural effusion. PleurX catheter remains in place. There is likely underlying right basilar atelectasis/infiltrate present.   Electronically Signed   By: Inez Catalina M.D.   On: 11/03/2014 07:31    Cardiac Studies:  EKG: 09/24/2014: Normal sinus rhythm, left axis deviation, left anterior fascicular block. Poor RV progression, LVH with repolarization abnormality.  Scheduled Meds: . amiodarone  200 mg Oral BID  . anastrozole  1 mg Oral Daily  . atorvastatin  10 mg Oral q1800  . calcium-vitamin D  1 tablet Oral Q breakfast  . feeding supplement (ENSURE ENLIVE)  237 mL Oral BID BM  . ferrous sulfate  325 mg Oral TID WC  . folic acid  1 mg Oral Daily  . linagliptin  5 mg Oral Daily  .  multivitamin with minerals  1 tablet Oral Daily  . pantoprazole  40 mg Oral Q0600  . thiamine  100 mg Oral Daily   Continuous Infusions: . heparin 1,000 Units/hr (11/02/14 1830)   PRN Meds:.acetaminophen, albuterol, ipratropium-albuterol, ondansetron (ZOFRAN) IV, traMADol  Assessment/Plan:  1. Acute on chronic renal insufficiency, stage IV chronic kidney disease, etiology not known.  Patient now admitted for fluid challenge. S. Cr improved with hydration, nephrology on board. 2. Recurrent pleural effusion, exudative type, patient presently has PleuRx catheter. Dr. Servando Snare has been consulted for management of same and to consider further therapy. 3. Paroxysmal atrial fibrillation, maintains sinus rhythm. CHA2DS2-VASc Score is 5 with yearly risk of stroke of 6.7% per year. 4. DM-2 controlled. 5. Long term anticoagulation for A. Fib 6. Ac. On Chronic diastolic heart failure due to excessive fluid bolus, now resolved. Has chronic diastolic heart failure. 7. Mild malnutrion.  Recommendation: Renal function improved with hydration. S/P talc pleurodesis 11/03/2014 by Dr. Servando Snare. Restart Coumadin. Has had significant variation in INR as op with both sub therapeutic levels and supratherapeutic levels. Hence I will discharge her on her home dose warfarin.  She can be discharged today from cardiac standpoint, however she has had significant local drainage today and she will need pleural drain done tomorrow as per TCTS, hence I will discharge her in the morning.  I have discussed the discharge instructions with the patient's daughter at the bedside.  She'll be discharged home without any diuretics as she has excellent diuresis without any diuretics and her serum creatinine has improved.  She is also advised to wear support stockings on a daily basis.  She will need INR checked on Monday.  Adrian Prows, MD 11/03/2014, 4:54 PM [facility] [place] Pager: [phone] Office: [phone] If no  answer: Cell:  [phone]

## 2014-11-03 NOTE — Progress Notes (Signed)
ANTICOAGULATION CONSULT NOTE - Follow Up Consult  Pharmacy Consult for Heparin Indication: recent PE  No Known Allergies  Patient Measurements: Height: '5\' 4"'$  (162.6 cm) Weight: 124 lb 14.4 oz (56.654 kg) IBW/kg (Calculated) : 54.7 Heparin Dosing Weight: 53 kg  Vital Signs: Temp: 99.8 F (37.7 C) (08/26 0500) Temp Source: Oral (08/26 0500) BP: 108/45 mmHg (08/26 0500) Pulse Rate: 83 (08/26 0500)  Labs:  Recent Labs  11/01/14 0500 11/01/14 1725  11/02/14 0315 11/02/14 1155 11/02/14 1746 11/03/14 0452  HGB  --   --   --  10.0*  --   --  9.8*  HCT  --   --   --  29.8*  --   --  30.5*  PLT  --   --   --  155  --   --  253  LABPROT 25.6* 18.4*  --  19.2*  --   --  19.2*  INR 2.36* 1.53*  --  1.62*  --   --  1.62*  HEPARINUNFRC  --   --   < > <0.10* 0.52 0.66 0.58  CREATININE 2.34*  --   --  2.05*  --   --  1.91*  < > = values in this interval not displayed.  Estimated Creatinine Clearance: 20.6 mL/min (by C-G formula based on Cr of 1.91).   Assessment: 81yo female with history of Afib and recent DVT (also h/o breast cancer) on Coumadin PTA.  Patient continues on IV heparin, now s/p talc pleurodesis.  Heparin for hx Afib and recent DVT (July 2016). Originally on Xarelto for Afib, held for thoracentesis, developed DVT and AKI, changed to Coumadin.  Coumadin held on admit for possible VATS +/- pleurodesis, and also received 7.'5mg'$  Vit K.  Heparin level remains therapeutic today at 0.58, H&H remains stable, PLT have increased from yesterday (078>675>449), no s/sx of bleeding.  Goal of Therapy:  Heparin level 0.3-0.7 units/ml Monitor platelets by anticoagulation protocol: Yes   Plan:  -Continue IV heparin at 1000 units/hr -Monitor daily HL, CBC, s/sx of bleeding -F/u anticoag plans for discharge  Drucie Opitz, PharmD Clinical Pharmacist Pager: [phone] 11/03/2014 7:52 AM

## 2014-11-03 NOTE — Progress Notes (Signed)
Patient ID: Alison Gross, female   DOB: April 06, 1932, 81 y.o.   MRN: [id number] S:still having some SOB but tolerated procedure well yesterday O:BP 95/42 mmHg  Pulse 76  Temp(Src) 99.8 F (37.7 C) (Oral)  Resp 18  Ht '5\' 4"'$  (1.626 m)  Wt 56.654 kg (124 lb 14.4 oz)  BMI 21.43 kg/m2  SpO2 100%  Intake/Output Summary (Last 24 hours) at 11/03/14 0847 Last data filed at 11/03/14 0227  Gross per 24 hour  Intake  708.8 ml  Output    750 ml  Net  -41.2 ml   Intake/Output: I/O last 3 completed shifts: In: 865.7 [P.O.:600; I.V.:265.7] Out: 1400 [Urine:1400]  Intake/Output this shift:    Weight change: 3.654 kg (8 lb 0.9 oz) OHK:GOVP AAF in NAD CVS:+rub, RRR Resp:decreased BS R>L with end exp wheezes bilaterally in upper lung fields CHE:KBTCYE Ext:1+ edema   Recent Labs Lab 10/30/14 2013 10/31/14 0742 11/01/14 0500 11/02/14 0315 11/03/14 0452  NA 142 145 142 144 142  K 4.6 3.1* 4.2 4.4 4.5  CL 98* 115* 109 112* 112*  CO2 34* '24 25 25 23  '$ GLUCOSE 117* 77 70 78 125*  BUN 79* 57* 56* 45* 38*  CREATININE 3.74* 2.44* 2.34* 2.05* 1.91*  ALBUMIN 3.3*  --   --   --   --   CALCIUM 9.6 6.7* 7.7* 8.7* 8.9  AST 42*  --   --   --   --   ALT 31  --   --   --   --    Liver Function Tests:  Recent Labs Lab 10/30/14 2013  AST 42*  ALT 31  ALKPHOS 85  BILITOT 1.0  PROT 6.5  ALBUMIN 3.3*   No results for input(s): LIPASE, AMYLASE in the last 168 hours. No results for input(s): AMMONIA in the last 168 hours. CBC:  Recent Labs Lab 10/30/14 2013 11/02/14 0315 11/03/14 0452  WBC 4.1 4.7 6.7  HGB 11.1* 10.0* 9.8*  HCT 34.4* 29.8* 30.5*  MCV 87.5 86.4 88.2  PLT 316 155 253   Cardiac Enzymes: No results for input(s): CKTOTAL, CKMB, CKMBINDEX, TROPONINI in the last 168 hours. CBG:  Recent Labs Lab 11/02/14 0620 11/02/14 1138 11/02/14 1701 11/02/14 2128 11/03/14 0653  GLUCAP 82 120* 96 103* 123*    Iron Studies: No results for input(s): IRON, TIBC, TRANSFERRIN,  FERRITIN in the last 72 hours. Studies/Results: Dg Chest 2 View  11/03/2014   CLINICAL DATA:  Increase shortness of Breath  EXAM: CHEST - 2 VIEW  COMPARISON:  11/01/2014  FINDINGS: Cardiac shadow remains enlarged. A right-sided pleural effusion is again identified and has increased in the interval from the prior exam. A PleurX catheter is again seen. A large thyroid goiter extending into the right superior mediastinum is noted. Deviation trachea is again seen and stable. A small left pleural effusion is stable.  IMPRESSION: Increasing right-sided pleural effusion. PleurX catheter remains in place. There is likely underlying right basilar atelectasis/infiltrate present.   Electronically Signed   By: Inez Catalina M.D.   On: 11/03/2014 07:31   Dg Chest 2 View  11/01/2014   CLINICAL DATA:  Shortness of breath, pleural effusion, history of breast cancer status post bilateral mastectomy, pneumonia  EXAM: CHEST  2 VIEW  COMPARISON:  Chest radiographs dated 10/31/2014. CT chest dated 09/19/2014.  FINDINGS: Medial right upper lobe mass.  Small bilateral pleural effusions, left greater than right.  No pneumothorax.  Cardiomegaly.  Status post bilateral mastectomy. Surgical clips  overlying the bilateral chest wall and right axilla.  IMPRESSION: Medial right upper lobe mass.  Small bilateral pleural effusions, left greater than right.  No pneumothorax is seen.   Electronically Signed   By: Julian Hy M.D.   On: 11/01/2014 11:25   . albuterol  2.5 mg Nebulization BID  . amiodarone  200 mg Oral BID  . anastrozole  1 mg Oral Daily  . atorvastatin  10 mg Oral q1800  . calcium-vitamin D  1 tablet Oral Q breakfast  . feeding supplement (ENSURE ENLIVE)  237 mL Oral BID BM  . ferrous sulfate  325 mg Oral TID WC  . folic acid  1 mg Oral Daily  . ipratropium-albuterol      . linagliptin  5 mg Oral Daily  . multivitamin with minerals  1 tablet Oral Daily  . pantoprazole  40 mg Oral Q0600  . thiamine  100 mg Oral  Daily    BMET    Component Value Date/Time   NA 142 11/03/2014 0452   NA 143 09/18/2014 0946   K 4.5 11/03/2014 0452   K 4.0 09/18/2014 0946   CL 112* 11/03/2014 0452   CL 107 08/05/2012 1009   CO2 23 11/03/2014 0452   CO2 30* 09/18/2014 0946   GLUCOSE 125* 11/03/2014 0452   GLUCOSE 94 09/18/2014 0946   GLUCOSE 128* 08/05/2012 1009   BUN 38* 11/03/2014 0452   BUN 31.2* 09/18/2014 0946   CREATININE 1.91* 11/03/2014 0452   CREATININE 1.7* 09/18/2014 0946   CALCIUM 8.9 11/03/2014 0452   CALCIUM 11.8* 09/18/2014 0946   CALCIUM 10.7* 07/27/2014 0830   GFRNONAA 24* 11/03/2014 0452   GFRAA 28* 11/03/2014 0452   CBC    Component Value Date/Time   WBC 6.7 11/03/2014 0452   WBC 4.5 09/18/2014 0946   RBC 3.46* 11/03/2014 0452   RBC 3.34* 09/18/2014 0946   RBC 2.80* 01/24/2012 1011   HGB 9.8* 11/03/2014 0452   HGB 9.6* 09/18/2014 0946   HCT 30.5* 11/03/2014 0452   HCT 29.4* 09/18/2014 0946   PLT 253 11/03/2014 0452   PLT 319 09/18/2014 0946   MCV 88.2 11/03/2014 0452   MCV 87.9 09/18/2014 0946   MCH 28.3 11/03/2014 0452   MCH 28.7 09/18/2014 0946   MCHC 32.1 11/03/2014 0452   MCHC 32.7 09/18/2014 0946   RDW 17.0* 11/03/2014 0452   RDW 16.8* 09/18/2014 0946   LYMPHSABS 0.6* 09/19/2014 0750   LYMPHSABS 0.6* 09/18/2014 0946   MONOABS 0.8 09/19/2014 0750   MONOABS 0.6 09/18/2014 0946   EOSABS 0.5 09/19/2014 0750   EOSABS 0.3 09/18/2014 0946   BASOSABS 0.1 09/19/2014 0750   BASOSABS 0.1 09/18/2014 0946     81 yo F with hx of DM II, HTN, large right sided goiter, recurrent pleural effusion s/p R PleuRx tube, pAFib, recent DVT, on coumadin, here with worsening kidney function.  1. AKI on CKD 3 - (baseline Scr ~ 1.6). Came in 3.74, low feNa and was started on IVF. Improving with fluid but had to be discontinued due to SOB 11/02/14.  1. Scr improved to 1.91 off of IVF's and s/p VATS. 2. Nothing further to add, will sign off.  Please call with questions or  concern. 3. Can follow up in our office in 4-6 weeks if Scr does not improve to baseline.  2. Recurrent right sided pleural effusion - 2/2 to large R goiter has R pleurX cath, (apparently not a candidate for R goiter removal).  1. S/p talc slurry via pleurX cath by Dr. Servando Snare. 3. SOB- per primary svc 4. pAfib and recent DVT - on coumadin (was on xarelto before). On amio.  5. Hypokalemia - repleted 6. Hypocalcemia - resolved 7. Grade 2 diastolic dysfunction with preserved EF  8. Iron deficiency anemia - on PO iron - cont. 9. Hypotensive - now better with fluid. May need to hold antihypertensives if BP decreases further.  [place] A

## 2014-11-03 NOTE — Progress Notes (Signed)
Patient ID: Alison Gross, female   DOB: 1932-07-19, 81 y.o.   MRN: [id number]      [address]       [place] [address]             [phone]                      LOS: 4 days   Subjective: No new complaints today, tolerated talc well no fever  Objective: Vital signs in last 24 hours: Patient Vitals for the past 24 hrs:  BP Temp Temp src Pulse Resp SpO2 Weight  11/03/14 0828 (!) 95/42 mmHg 99.8 F (37.7 C) Oral 76 18 100 % -  11/03/14 0500 (!) 108/45 mmHg 99.8 F (37.7 C) Oral 83 17 100 % 124 lb 14.4 oz (56.654 kg)  11/03/14 0151 - - - 84 18 100 % -  11/02/14 1954 (!) 132/57 mmHg 98.2 F (36.8 C) Oral 70 20 100 % -  11/02/14 1933 - - - 71 16 99 % -    Filed Weights   11/01/14 0655 11/02/14 0554 11/03/14 0500  Weight: 114 lb 15.5 oz (52.148 kg) 116 lb 13.5 oz (53 kg) 124 lb 14.4 oz (56.654 kg)    Hemodynamic parameters for last 24 hours:    Intake/Output from previous day: 08/25 0701 - 08/26 0700 In: 708.8 [P.O.:480; I.V.:228.8] Out: 750 [Urine:750] Intake/Output this shift: Total I/O In: 240 [P.O.:240] Out: 950 [Chest Tube:950]  Scheduled Meds: . amiodarone  200 mg Oral BID  . anastrozole  1 mg Oral Daily  . atorvastatin  10 mg Oral q1800  . calcium-vitamin D  1 tablet Oral Q breakfast  . feeding supplement (ENSURE ENLIVE)  237 mL Oral BID BM  . ferrous sulfate  325 mg Oral TID WC  . folic acid  1 mg Oral Daily  . linagliptin  5 mg Oral Daily  . multivitamin with minerals  1 tablet Oral Daily  . pantoprazole  40 mg Oral Q0600  . thiamine  100 mg Oral Daily   Continuous Infusions: . heparin 1,000 Units/hr (11/02/14 1830)   PRN Meds:.acetaminophen, albuterol, ipratropium-albuterol, ondansetron (ZOFRAN) IV, traMADol  General appearance: alert and cooperative Neurologic: intact Heart: irregularly irregular rhythm Lungs: diminished breath sounds bibasilar Abdomen: soft, non-tender; bowel sounds normal; no masses,  no  organomegaly Extremities: extremities normal, atraumatic, no cyanosis or edema and Homans sign is negative, no sign of DVT  Lab Results: CBC: Recent Labs  11/02/14 0315 11/03/14 0452  WBC 4.7 6.7  HGB 10.0* 9.8*  HCT 29.8* 30.5*  PLT 155 253   BMET:  Recent Labs  11/02/14 0315 11/03/14 0452  NA 144 142  K 4.4 4.5  CL 112* 112*  CO2 25 23  GLUCOSE 78 125*  BUN 45* 38*  CREATININE 2.05* 1.91*  CALCIUM 8.7* 8.9    PT/INR:  Recent Labs  11/03/14 0452  LABPROT 19.2*  INR 1.62*     Radiology Dg Chest 2 View  11/03/2014   CLINICAL DATA:  Increase shortness of Breath  EXAM: CHEST - 2 VIEW  COMPARISON:  11/01/2014  FINDINGS: Cardiac shadow remains enlarged. A right-sided pleural effusion is again identified and has increased in the interval from the prior exam. A PleurX catheter is again seen. A large thyroid goiter extending into the right superior mediastinum is noted. Deviation trachea is again seen and stable. A small left pleural effusion is stable.  IMPRESSION: Increasing right-sided pleural effusion. PleurX  catheter remains in place. There is likely underlying right basilar atelectasis/infiltrate present.   Electronically Signed   By: Inez Catalina M.D.   On: 11/03/2014 07:31     Assessment/Plan: 900 ml drained today Tolerated talc yesterday Continue drainage MWFS as pre admission IF home tomorrow drain before discharge Has appointment to see me  September 9 10 :30 am with chest xray  [name] MD 11/03/2014 3:17 PM

## 2014-11-04 LAB — GLUCOSE, CAPILLARY
Glucose-Capillary: 103 mg/dL — ABNORMAL HIGH (ref 65–99)
Glucose-Capillary: 150 mg/dL — ABNORMAL HIGH (ref 65–99)
Glucose-Capillary: 100 mg/dL — ABNORMAL HIGH (ref 65–99)
Glucose-Capillary: 109 mg/dL — ABNORMAL HIGH (ref 65–99)

## 2014-11-04 LAB — BASIC METABOLIC PANEL
Anion gap: 8 (ref 5–15)
Anion gap: 6 (ref 5–15)
BUN: 41 mg/dL — ABNORMAL HIGH (ref 6–20)
BUN: 44 mg/dL — ABNORMAL HIGH (ref 6–20)
CALCIUM: 8.9 mg/dL (ref 8.9–10.3)
CHLORIDE: 109 mmol/L (ref 101–111)
CO2: 25 mmol/L (ref 22–32)
CO2: 25 mmol/L (ref 22–32)
CREATININE: 2.11 mg/dL — AB (ref 0.44–1.00)
CREATININE: 2.42 mg/dL — AB (ref 0.44–1.00)
Calcium: 9.1 mg/dL (ref 8.9–10.3)
Chloride: 107 mmol/L (ref 101–111)
GFR calc Af Amer: 25 mL/min — ABNORMAL LOW (ref >60)
GFR calc non Af Amer: 21 mL/min — ABNORMAL LOW (ref >60)
GFR calc non Af Amer: 18 mL/min — ABNORMAL LOW (ref >60)
GFR, EST AFRICAN AMERICAN: 21 mL/min — AB (ref >60)
GLUCOSE: 107 mg/dL — AB (ref 65–99)
Glucose, Bld: 108 mg/dL — ABNORMAL HIGH (ref 65–99)
Potassium: 5.6 mmol/L — ABNORMAL HIGH (ref 3.5–5.1)
Potassium: 5.6 mmol/L — ABNORMAL HIGH (ref 3.5–5.1)
Sodium: 140 mmol/L (ref 135–145)
Sodium: 140 mmol/L (ref 135–145)

## 2014-11-04 LAB — PROTIME-INR
INR: 2.08 — ABNORMAL HIGH (ref 0.00–1.49)
Prothrombin Time: 23.2 seconds — ABNORMAL HIGH (ref 11.6–15.2)

## 2014-11-04 MED ORDER — SODIUM POLYSTYRENE SULFONATE 15 GM/60ML PO SUSP
30.0000 g | Freq: Once | ORAL | Status: AC
  Administered 2014-11-04: 30 g via ORAL
  Filled 2014-11-04: qty 120

## 2014-11-04 MED ORDER — THIAMINE HCL 100 MG PO TABS: 100.0000 mg | ORAL_TABLET | Freq: Every day | ORAL | Status: DC

## 2014-11-04 MED ORDER — AMIODARONE HCL 200 MG PO TABS: 200.0000 mg | ORAL_TABLET | Freq: Every day | ORAL | Status: DC

## 2014-11-04 NOTE — Progress Notes (Signed)
At 1240 notified Dr, Einar Gip that pt has had a temp of 10l.0 this am at o507 and temp taken now 99.555 and if ok to go home and that case manager Judson Roch was also asking.  MD instructed that is ok to d/c pt and blood cx has been done and cause can come from procedure done around pleurex cath site.  Arloa Prak,RN.

## 2014-11-04 NOTE — Discharge Summary (Signed)
Physician Discharge Summary  Patient ID: Alison Gross MRN: [id number] DOB/AGE: 1932-09-07 81 y.o.  Admit date: 10/30/2014 Discharge date: 11/04/2014  Primary Discharge Diagnosis 1. Acute on chronic renal insufficiency, stage IV chronic kidney disease, etiology not known. S. Cr improved with hydration, nephrology consulted. 2. Recurrent pleural effusion, exudative type, patient presently has PleuRx catheter right. Dr. Servando Snare has been consulted for management of same, s/p talc pleurodesis on 11/02/2014. 3. Paroxysmal atrial fibrillation, maintains sinus rhythm. CHA2DS2-VASc Score is 5 with yearly risk of stroke of 6.7% per year. 4. DM-2 controlled. 5. Long term anticoagulation for A. Fib 6. Ac. On Chronic diastolic heart failure due to excessive fluid bolus, now resolved. Has chronic diastolic heart failure. 7. DM-2 controlled 8. Hyperlipidemia 9. Mild malnutrition and anemia of chronic disease and iron defeciency.  Hospital Course:  Alison Gross is a 81 year old African-American female with history of hypertension, diabetes mellitus, and history of breast cancer status post bilateral mastectomy in the remote past, retrosternal Goitre was initially referred to me for evaluation of atrial fibrillation detected during normal physical examination on 09/07/2013. She converted to sinus rhythm spontaneously.  She was recently admitted to the hospital on 09/19/2014 with shortness of breath and diagnosed with pleural effusion which has been recurrent over the past year secondary to a very large thyroid goiter. While in the hospital, she had a central venous catheter placed and Xarelto was held in order to perform thoracentesis. She subsequently developed DVTs to the right axillary and subclavian veins. Due to declining renal function from acute illness, Xarelto was no longer appropriate and presently on warfarin. Her INR is being checked at home by home health nurse. She was also started on  Amiodarone for A. Fib with RVR. She  has a Pleurx drain on the right in place with home health nurse coming out to house 3 times a week to remove collected fluid.  She is still draining 400-690m fluid a day. Due to rapidly deteriorating renal function, after discussions with nephrology,she was admitted to the hospital by me for fluid challenge, her renal function improved with fluid challenge.  She had good diuresis without any diuretics, leg edema completely resolved.  Dr. ECeasar Monswas also consultant who performed talc pleurodesis due to recurrent right pleural effusion, performed on 11/02/2014.  On the day of discharge, she still has drainage in her right  PleuRx tube.it was drained prior to discharge.   She did have mild temperature during the night, and cultures were sent out.  However fever was expected after the procedure.  Nephrology was also consulted, the fact that pleurocentesis was the etiology for deteriorating renal function.  Fluid challenge did improve renal function.  However she still continues to have stage 3-4 renal failure that has recently started for the past 3-4 months.  Recommendations on discharge: patient will continue to have home health care monitor her pro time and INR.  She'll follow with Dr. GServando Snareand also with Dr. MEdrick Ohfrom surgical a nephrology standpoint.  I'll continue to follow her for paroxysmal atrial fibrillation and chronic diastolic heart failure.  Discharge Exam: Blood pressure 95/46, pulse 75, temperature 101 F (38.3 C), temperature source Oral, resp. rate 18, height '5\' 4"'$  (1.626 m), weight 56.7 kg (125 lb), SpO2 98 %.   General appearance: alert, cooperative, appears stated age, no distress, appearsfatigued, becoming cachectic  Eyes: negative findings: lids and lashes normal Neck: no carotid bruit, no JVD and supple, symmetrical, trachea midline Resp: decreased breath sounds  bilaterally more than halfway at the bases. Chest wall: no  tenderness Cardio: S1, S2 normal, no S3 or S4 and systolic murmur: early systolic 3/6, crescendo at lower left sternal border, at apex GI: soft, non-tender; bowel sounds normal; no masses, no organomegaly Extremities: edema - trace, in the legs, nontender. Right upper extremity 2 plus edema from IV fluid extravasation. Full range of motion in extremities. Pulses: No carotid bruit, femoral pulses normal. Pedal pulses faint. Skin: Skin color, texture, turgor normal. No rashes or lesions Neurologic: Grossly normal  Labs:   Lab Results  Component Value Date   WBC 6.7 11/03/2014   HGB 9.8* 11/03/2014   HCT 30.5* 11/03/2014   MCV 88.2 11/03/2014   PLT 253 11/03/2014    Recent Labs Lab 10/30/14 2013  11/04/14 0326  NA 142  < > 140  K 4.6  < > 5.6*  CL 98*  < > 107  CO2 34*  < > 25  BUN 79*  < > 41*  CREATININE 3.74*  < > 2.11*  CALCIUM 9.6  < > 8.9  PROT 6.5  --   --   BILITOT 1.0  --   --   ALKPHOS 85  --   --   ALT 31  --   --   AST 42*  --   --   GLUCOSE 117*  < > 107*  < > = values in this interval not displayed.   BNP (last 3 results)  Recent Labs  09/19/14 0750 09/20/14 1745 09/28/14 0238  BNP 769.3* 808.4* 947.8*    HEMOGLOBIN A1C Lab Results  Component Value Date   HGBA1C 6.2* 07/27/2014   MPG 131 07/27/2014    Cardiac Panel (last 3 results)  Recent Labs  09/20/14 1745 09/21/14 0030 09/21/14 0530  TROPONINI 0.33* 0.30* 0.29*    TSH  Recent Labs  07/27/14 0515 08/23/14 0822  TSH 1.036 1.115    EKG: 09/24/2014: Normal sinus rhythm, left axis deviation, left anterior fascicular block. Poor RV progression, LVH with repolarization abnormality.  Radiology: Dg Chest 2 View  11/03/2014   CLINICAL DATA:  Increase shortness of Breath  EXAM: CHEST - 2 VIEW  COMPARISON:  11/01/2014  FINDINGS: Cardiac shadow remains enlarged. A right-sided pleural effusion is again identified and has increased in the interval from the prior exam. A PleurX catheter is  again seen. A large thyroid goiter extending into the right superior mediastinum is noted. Deviation trachea is again seen and stable. A small left pleural effusion is stable.  IMPRESSION: Increasing right-sided pleural effusion. PleurX catheter remains in place. There is likely underlying right basilar atelectasis/infiltrate present.   Electronically Signed   By: Inez Catalina M.D.   On: 11/03/2014 07:31   Dg Chest 2 View  11/01/2014   CLINICAL DATA:  Shortness of breath, pleural effusion, history of breast cancer status post bilateral mastectomy, pneumonia  EXAM: CHEST  2 VIEW  COMPARISON:  Chest radiographs dated 10/31/2014. CT chest dated 09/19/2014.  FINDINGS: Medial right upper lobe mass.  Small bilateral pleural effusions, left greater than right.  No pneumothorax.  Cardiomegaly.  Status post bilateral mastectomy. Surgical clips overlying the bilateral chest wall and right axilla.  IMPRESSION: Medial right upper lobe mass.  Small bilateral pleural effusions, left greater than right.  No pneumothorax is seen.   Electronically Signed   By: Julian Hy M.D.   On: 11/01/2014 11:25   Dg Chest Port 1 View  10/31/2014   CLINICAL DATA:  Recurrent right pleural effusion. Stage 3 kidney disease.  EXAM: PORTABLE CHEST - 1 VIEW  COMPARISON:  09/28/2014, 09/19/2014 and chest CT 09/19/2014  FINDINGS: Right-sided PleurX catheter unchanged. There is a thin white line over the right apex as this may represent a small residual right pneumothorax, although may represent artifact as there is a similar line over the soft tissues of the right flank. Evidence of a small amount of bilateral pleural fluid unchanged. Remainder of the lungs are clear. Cardiac silhouette unchanged. Calcified plaque over the aortic arch. Stable large substernal goiter.  IMPRESSION: Stable small bilateral pleural effusions.  Tiny right apical pneumothorax versus artifact. Right PleurX catheter unchanged.   Electronically Signed   By: Marin Olp M.D.   On: 10/31/2014 10:14      FOLLOW UP PLANS AND APPOINTMENTS    Medication List    STOP taking these medications        losartan-hydrochlorothiazide 100-12.5 MG per tablet  Commonly known as:  HYZAAR     metoprolol tartrate 25 MG tablet  Commonly known as:  LOPRESSOR     torsemide 20 MG tablet  Commonly known as:  DEMADEX      TAKE these medications        acetaminophen 500 MG tablet  Commonly known as:  TYLENOL  Take 500 mg by mouth 2 (two) times daily as needed for moderate pain or headache.     amiodarone 200 MG tablet  Commonly known as:  PACERONE  Take 1 tablet (200 mg total) by mouth daily.     anastrozole 1 MG tablet  Commonly known as:  ARIMIDEX  TAKE 1 TABLET (1 MG TOTAL) BY MOUTH DAILY.     feeding supplement (ENSURE ENLIVE) Liqd  Take 237 mLs by mouth 2 (two) times daily between meals.     ferrous sulfate 325 (65 FE) MG tablet  Take 1 tablet (325 mg total) by mouth 3 (three) times daily with meals.     multivitamin with minerals Tabs tablet  Take 1 tablet by mouth every other day.     thiamine 100 MG tablet  Take 1 tablet (100 mg total) by mouth daily.     TRADJENTA 5 MG Tabs tablet  Generic drug:  linagliptin  Take 5 mg by mouth daily.     VENTOLIN HFA 108 (90 BASE) MCG/ACT inhaler  Generic drug:  albuterol  Inhale 2 puffs into the lungs every 4 (four) hours as needed for wheezing or shortness of breath.     Vitamin D 2000 UNITS Caps  Take by mouth.     warfarin 2 MG tablet  Commonly known as:  COUMADIN  Take 2 mg by mouth daily at 6 PM.           Follow-up Information    Follow up with [name], MD On 11/16/2014.   Specialty:  Cardiothoracic Surgery   Why:  Appointment is at 10:30, please get CXR at 10:00 located on 1st floor of [place] information:   [place] [phone]        Adrian Prows, MD 11/04/2014, 10:30 AM  Pager:  [phone] Office: [phone] If no answer: [phone]

## 2014-11-04 NOTE — Care Management Note (Signed)
Case Management Note  Patient Details  Name: [name] MRN: [id number] Date of Birth: 03-12-1932  Subjective/Objective:                   shortness of breath and diagnosed with pleural effusion Action/Plan:  Discharge planning Expected Discharge Date:                  Expected Discharge Plan:  [place]  In-House Referral:     Discharge planning Services  CM Consult  Post Acute Care Choice:  Home Health, Resumption of Svcs/PTA Provider Choice offered to:  NA  DME Arranged:  Other see comment DME Agency:  [place]:  RN [facility] Agency:     Status of Service:  complete  Medicare Important Message Given:  Yes-second notification given Date Medicare IM Given:    Medicare IM give by:    Date Additional Medicare IM Given:    Additional Medicare Important Message give by:     If discussed at [place] of Stay Meetings, dates discussed:    Additional Comments: CM met with pt who confirms she is active with [facility] for [facility] pleurex drain sys mgmt.  Pt states she has cannisters at home and [facility] supplies the cannisters.  CM called [facility] rep, Tiffany to notify of pt discharge.  No other CM needs were communicated. Dellie Catholic, RN 11/04/2014, 11:13 AM

## 2014-11-04 NOTE — Progress Notes (Signed)
Temp = 101.0. MD paged. Orders received for blood cultures before Tylenol administration. Will continue to monitor.

## 2014-11-05 LAB — PROTIME-INR
INR: 2.29 — ABNORMAL HIGH (ref 0.00–1.49)
Prothrombin Time: 24.9 seconds — ABNORMAL HIGH (ref 11.6–15.2)

## 2014-11-05 LAB — GLUCOSE, CAPILLARY
GLUCOSE-CAPILLARY: 83 mg/dL (ref 65–99)
GLUCOSE-CAPILLARY: 128 mg/dL — AB (ref 65–99)

## 2014-11-05 LAB — BASIC METABOLIC PANEL
Anion gap: 8 (ref 5–15)
BUN: 50 mg/dL — AB (ref 6–20)
CHLORIDE: 109 mmol/L (ref 101–111)
CO2: 22 mmol/L (ref 22–32)
Calcium: 8.8 mg/dL — ABNORMAL LOW (ref 8.9–10.3)
Creatinine, Ser: 2.59 mg/dL — ABNORMAL HIGH (ref 0.44–1.00)
GFR, EST AFRICAN AMERICAN: 19 mL/min — AB (ref >60)
GFR, EST NON AFRICAN AMERICAN: 17 mL/min — AB (ref >60)
Glucose, Bld: 117 mg/dL — ABNORMAL HIGH (ref 65–99)
POTASSIUM: 4.6 mmol/L (ref 3.5–5.1)
SODIUM: 139 mmol/L (ref 135–145)

## 2014-11-05 MED ORDER — WARFARIN SODIUM 4 MG PO TABS: 4.0000 mg | ORAL_TABLET | Freq: Once | ORAL | Status: DC

## 2014-11-05 MED ORDER — SODIUM POLYSTYRENE SULFONATE 15 GM/60ML PO SUSP
60.0000 g | Freq: Once | ORAL | Status: AC
  Administered 2014-11-05: 60 g
  Filled 2014-11-05: qty 240

## 2014-11-05 NOTE — Progress Notes (Signed)
ANTICOAGULATION CONSULT NOTE - Follow Up Consult  Pharmacy Consult for Warfarin Indication: recent PE  No Known Allergies  Patient Measurements: Height: '5\' 4"'$  (162.6 cm) Weight: 122 lb 4.8 oz (55.475 kg) IBW/kg (Calculated) : 54.7 Heparin Dosing Weight: 53 kg  Vital Signs: Temp: 99.6 F (37.6 C) (08/28 0639) Temp Source: Oral (08/28 0639) BP: 93/36 mmHg (08/28 0639) Pulse Rate: 77 (08/28 0639)  Labs:  Recent Labs  11/02/14 1155 11/02/14 1746 11/03/14 0452 11/04/14 0326 11/04/14 1824 11/05/14 0434  HGB  --   --  9.8*  --   --   --   HCT  --   --  30.5*  --   --   --   PLT  --   --  253  --   --   --   LABPROT  --   --  19.2* 23.2*  --  24.9*  INR  --   --  1.62* 2.08*  --  2.29*  HEPARINUNFRC 0.52 0.66 0.58  --   --   --   CREATININE  --   --  1.91* 2.11* 2.42*  --     Estimated Creatinine Clearance: 16.3 mL/min (by C-G formula based on Cr of 2.42).   Assessment: 81yo female with history of Afib and recent DVT (July 2016) and h/o breast cancer, on Coumadin PTA.  Patient currently on IV heparin infusion, now s/p talc pleurodesis.  Dr. Einar Gip has ordered pharmacy to resume coumadin. Heparin level remained therapeutic today at 0.58, H&H stable, PLT have increased from yesterday (259>563>875), no s/sx of bleeding. Coumadin held on admit 10/30/14 for possible VATS +/- pleurodesis. INR on 8/22 was 3.52. She received 7.'5mg'$  Vit K on 10/31/14.  INR today = 1.62    Past history: originally on Xarelto for Afib, held for thoracentesis, developed DVT and AKI, changed to Coumadin.  PTA coumadin dose: 2 mg daily , last taken on 10/29/14 PTA. On amiodarone 200 mg po BID which continues.   This morning's INR is therapeutic at 2.29.    Goal of Therapy:  INR 2-3 Monitor platelets by anticoagulation protocol: Yes    Plan:  Coumadin 4 mg po tonight, likely to return to home dose tomorrow Daily INR/CBC Monitor s/sx of bleeding  Andrey Cota. Diona Foley, PharmD Clinical Pharmacist Pager  [phone]  11/05/2014 11:17 AM

## 2014-11-05 NOTE — Progress Notes (Signed)
Patient ID: Alison Gross, female   DOB: December 11, 1932, 81 y.o.   MRN: [id number] S:feels better but had hyperkalemia yesterday as well as worsening Scr.  Awaiting repeat labs for today.  Had signed off but were keeping up with labs so back on case in light of rising Scr and hyperkalemia O:BP 93/36 mmHg  Pulse 77  Temp(Src) 99.6 F (37.6 C) (Oral)  Resp 81  Ht '5\' 4"'$  (1.626 m)  Wt 55.475 kg (122 lb 4.8 oz)  BMI 20.98 kg/m2  SpO2 93%  Intake/Output Summary (Last 24 hours) at 11/05/14 1124 Last data filed at 11/05/14 0833  Gross per 24 hour  Intake    900 ml  Output      0 ml  Net    900 ml   Intake/Output: I/O last 3 completed shifts: In: 900 [P.O.:900] Out: 950 [Urine:200; Chest Tube:750]  Intake/Output this shift:  Total I/O In: 360 [P.O.:360] Out: -  Weight change: -1.225 kg (-2 lb 11.2 oz) Gen:WD WN AAF in NAD CVS:no rub Resp:decreased BS at bases UUV:OZDGUY Ext:1+ pitting edema bilaterally   Recent Labs Lab 10/30/14 2013 10/31/14 0742 11/01/14 0500 11/02/14 0315 11/03/14 0452 11/04/14 0326 11/04/14 1824  NA 142 145 142 144 142 140 140  K 4.6 3.1* 4.2 4.4 4.5 5.6* 5.6*  CL 98* 115* 109 112* 112* 107 109  CO2 34* '24 25 25 23 25 25  '$ GLUCOSE 117* 77 70 78 125* 107* 108*  BUN 79* 57* 56* 45* 38* 41* 44*  CREATININE 3.74* 2.44* 2.34* 2.05* 1.91* 2.11* 2.42*  ALBUMIN 3.3*  --   --   --   --   --   --   CALCIUM 9.6 6.7* 7.7* 8.7* 8.9 8.9 9.1  AST 42*  --   --   --   --   --   --   ALT 31  --   --   --   --   --   --    Liver Function Tests:  Recent Labs Lab 10/30/14 2013  AST 42*  ALT 31  ALKPHOS 85  BILITOT 1.0  PROT 6.5  ALBUMIN 3.3*   No results for input(s): LIPASE, AMYLASE in the last 168 hours. No results for input(s): AMMONIA in the last 168 hours. CBC:  Recent Labs Lab 10/30/14 2013 11/02/14 0315 11/03/14 0452  WBC 4.1 4.7 6.7  HGB 11.1* 10.0* 9.8*  HCT 34.4* 29.8* 30.5*  MCV 87.5 86.4 88.2  PLT 316 155 253   Cardiac Enzymes: No  results for input(s): CKTOTAL, CKMB, CKMBINDEX, TROPONINI in the last 168 hours. CBG:  Recent Labs Lab 11/04/14 1106 11/04/14 1618 11/04/14 2141 11/05/14 0706 11/05/14 1117  GLUCAP 150* 100* 109* 83 128*    Iron Studies: No results for input(s): IRON, TIBC, TRANSFERRIN, FERRITIN in the last 72 hours. Studies/Results: No results found. Marland Kitchen amiodarone  200 mg Oral BID  . anastrozole  1 mg Oral Daily  . atorvastatin  10 mg Oral q1800  . calcium-vitamin D  1 tablet Oral Q breakfast  . feeding supplement (ENSURE ENLIVE)  237 mL Oral BID BM  . ferrous sulfate  325 mg Oral TID WC  . folic acid  1 mg Oral Daily  . linagliptin  5 mg Oral Daily  . multivitamin with minerals  1 tablet Oral Daily  . pantoprazole  40 mg Oral Q0600  . thiamine  100 mg Oral Daily  . Warfarin - Pharmacist Dosing Inpatient   Does not  apply q1800    BMET    Component Value Date/Time   NA 140 11/04/2014 1824   NA 143 09/18/2014 0946   K 5.6* 11/04/2014 1824   K 4.0 09/18/2014 0946   CL 109 11/04/2014 1824   CL 107 08/05/2012 1009   CO2 25 11/04/2014 1824   CO2 30* 09/18/2014 0946   GLUCOSE 108* 11/04/2014 1824   GLUCOSE 94 09/18/2014 0946   GLUCOSE 128* 08/05/2012 1009   BUN 44* 11/04/2014 1824   BUN 31.2* 09/18/2014 0946   CREATININE 2.42* 11/04/2014 1824   CREATININE 1.7* 09/18/2014 0946   CALCIUM 9.1 11/04/2014 1824   CALCIUM 11.8* 09/18/2014 0946   CALCIUM 10.7* 07/27/2014 0830   GFRNONAA 18* 11/04/2014 1824   GFRAA 21* 11/04/2014 1824   CBC    Component Value Date/Time   WBC 6.7 11/03/2014 0452   WBC 4.5 09/18/2014 0946   RBC 3.46* 11/03/2014 0452   RBC 3.34* 09/18/2014 0946   RBC 2.80* 01/24/2012 1011   HGB 9.8* 11/03/2014 0452   HGB 9.6* 09/18/2014 0946   HCT 30.5* 11/03/2014 0452   HCT 29.4* 09/18/2014 0946   PLT 253 11/03/2014 0452   PLT 319 09/18/2014 0946   MCV 88.2 11/03/2014 0452   MCV 87.9 09/18/2014 0946   MCH 28.3 11/03/2014 0452   MCH 28.7 09/18/2014 0946   MCHC  32.1 11/03/2014 0452   MCHC 32.7 09/18/2014 0946   RDW 17.0* 11/03/2014 0452   RDW 16.8* 09/18/2014 0946   LYMPHSABS 0.6* 09/19/2014 0750   LYMPHSABS 0.6* 09/18/2014 0946   MONOABS 0.8 09/19/2014 0750   MONOABS 0.6 09/18/2014 0946   EOSABS 0.5 09/19/2014 0750   EOSABS 0.3 09/18/2014 0946   BASOSABS 0.1 09/19/2014 0750   BASOSABS 0.1 09/18/2014 0946    81 yo F with hx of DM II, HTN, large right sided goiter, recurrent pleural effusion s/p R PleuRx tube, pAFib, recent DVT, on coumadin, here with worsening kidney function.  1. AKI on CKD 3 - (baseline Scr ~ 1.6). Came in 3.74, low feNa and was started on IVF. Improving with fluid but had to be discontinued due to SOB 11/02/14.  1. Scr improved to 1.91 off of IVF's and s/p VATS but now climbing again.  Will order FeNa and other serologies 2. May need furosemide if hyperkalemia persists and edema 3. Can follow up in our office in 4-6 weeks if Scr does not improve to baseline.  2. Recurrent right sided pleural effusion - 2/2 to large R goiter has R pleurX cath, (apparently not a candidate for R goiter removal).  1. S/p talc slurry via pleurX cath by Dr. Servando Snare. 3. SOB- per primary svc 4. pAfib and recent DVT - on coumadin (was on xarelto before). On amio.  5. Hypokalemia - repleted and now hyperkalemic.  Stop KCl supplementation and consider adding diuretic pending labs today 6. Hypocalcemia - resolved 7. Grade 2 diastolic dysfunction with preserved EF  8. Iron deficiency anemia - on PO iron - cont. 9. Hypotensive - now better with fluid. May need to hold antihypertensives if BP decreases further. 10. Disposition- agree with delaying discharge due to worsening renal function and hyperkalemia.  Further work up required.    [place] A

## 2014-11-05 NOTE — Progress Notes (Signed)
Subjective:  Feels much better this morning. Due to hyperkalemia, her discharge needed to be canceled. Morning labs are pending. No chest pain. Good UOP without diuretics. Coumadin resumed, INR therapeutic..   Objective:  Vital Signs in the last 24 hours: Temp:  [98.6 F (37 C)-100.7 F (38.2 C)] 99.6 F (37.6 C) (08/28 0639) Pulse Rate:  [75-77] 77 (08/28 0639) Resp:  [16-81] 81 (08/28 0639) BP: (85-97)/(36-46) 93/36 mmHg (08/28 0639) SpO2:  [93 %-98 %] 93 % (08/28 0639) Weight:  [55.475 kg (122 lb 4.8 oz)] 55.475 kg (122 lb 4.8 oz) (08/28 0639)  Intake/Output from previous day: 08/27 0701 - 08/28 0700 In: 660 [P.O.:660] Out: 750 [Chest Tube:750]   Intake/Output Summary (Last 24 hours) at 11/05/14 1012 Last data filed at 11/05/14 1540  Gross per 24 hour  Intake    900 ml  Output      0 ml  Net    900 ml     Physical Exam: General appearance: alert, cooperative, appears stated age, no distress, appearsfatigued, becoming cachectic  Eyes: negative findings: lids and lashes normal Neck: no carotid bruit, no JVD and supple, symmetrical, trachea midline Resp: decreased breath sounds bilaterally more than halfway at the bases. Chest wall: no tenderness Cardio: S1, S2 normal, no S3 or S4 and systolic murmur: early systolic 3/6, crescendo at lower left sternal border, at apex GI: soft, non-tender; bowel sounds normal; no masses, no organomegaly Extremities: edema - trace, nontender. Full range of motion in extremities. Pulses: No carotid bruit, femoral pulses normal. Pedal pulses faint. Skin: Skin color, texture, turgor normal. No rashes or lesions Neurologic: Grossly normal    Lab Results: BMP  Recent Labs  11/03/14 0452 11/04/14 0326 11/04/14 1824  NA 142 140 140  K 4.5 5.6* 5.6*  CL 112* 107 109  CO2 '23 25 25  '$ GLUCOSE 125* 107* 108*  BUN 38* 41* 44*  CREATININE 1.91* 2.11* 2.42*  CALCIUM 8.9 8.9 9.1  GFRNONAA 24* 21* 18*  GFRAA 28* 25* 21*    CBC  Recent  Labs Lab 11/03/14 0452  WBC 6.7  RBC 3.46*  HGB 9.8*  HCT 30.5*  PLT 253  MCV 88.2  MCH 28.3  MCHC 32.1  RDW 17.0*    HEMOGLOBIN A1C Lab Results  Component Value Date   HGBA1C 6.2* 07/27/2014   MPG 131 07/27/2014    TSH  Recent Labs  07/27/14 0515 08/23/14 0822  TSH 1.036 1.115   Hepatic Function Panel  Recent Labs  07/26/14 1825  08/24/14 1420  09/26/14 2219 09/29/14 0340 10/30/14 2013  PROT 7.1  < > 6.1*  < > 6.0* 5.7* 6.5  ALBUMIN 3.5  < > 3.0*  < > 3.0* 2.9* 3.3*  AST 38  < > 24  < > 21 16 42*  ALT 33  < > 15  < > 16 12* 31  ALKPHOS 85  < > 65  < > 74 54 85  BILITOT 0.9  < > 0.6  < > 0.7 0.5 1.0  BILIDIR 0.2  --  <0.1*  --   --   --   --   IBILI 0.7  --  NOT CALCULATED  --   --   --   --   < > = values in this interval not displayed.   EKG: 09/24/2014: Normal sinus rhythm, left axis deviation, left anterior fascicular block. Poor RV progression, LVH with repolarization abnormality.  Scheduled Meds: . amiodarone  200 mg Oral BID  .  anastrozole  1 mg Oral Daily  . atorvastatin  10 mg Oral q1800  . calcium-vitamin D  1 tablet Oral Q breakfast  . feeding supplement (ENSURE ENLIVE)  237 mL Oral BID BM  . ferrous sulfate  325 mg Oral TID WC  . folic acid  1 mg Oral Daily  . linagliptin  5 mg Oral Daily  . multivitamin with minerals  1 tablet Oral Daily  . pantoprazole  40 mg Oral Q0600  . thiamine  100 mg Oral Daily  . Warfarin - Pharmacist Dosing Inpatient   Does not apply q1800   Continuous Infusions:   PRN Meds:.acetaminophen, albuterol, ipratropium-albuterol, ondansetron (ZOFRAN) IV, traMADol  Assessment/Plan:  1. Acute on chronic renal insufficiency, stage IV chronic kidney disease, etiology not known.  Patient now admitted for fluid challenge. S. Cr improved with hydration, nephrology on board. 2. Recurrent pleural effusion, exudative type, patient presently has PleuRx catheter. Dr. Servando Snare has been consulted for management of same and to  consider further therapy. 3. Paroxysmal atrial fibrillation, maintains sinus rhythm. CHA2DS2-VASc Score is 5 with yearly risk of stroke of 6.7% per year. 4. DM-2 controlled. 5. Long term anticoagulation for A. Fib 6. Ac. On Chronic diastolic heart failure due to excessive fluid bolus, now resolved. Has chronic diastolic heart failure. 7. Mild malnutrion. 8. Hyperkalemia probably due to chronic renal insufficiency.  Recommendation: If potassium levels have normalized, She can be discharged today from cardiac standpoint, She is also advised to wear support stockings on a daily basis.  I will follow up in the outpatient basis. Adrian Prows, MD 11/05/2014, 10:12 AM [facility]. [place] Pager: [phone] Office: [phone] If no answer: Cell:  [phone]

## 2014-11-07 ENCOUNTER — Observation Stay (HOSPITAL_COMMUNITY)
Admission: EM | Admit: 2014-11-07 | Discharge: 2014-11-08 | Disposition: A | Discharge status: Home / Self Care | Payer: Commercial Managed Care - HMO | Attending: Cardiology | Admitting: Cardiology

## 2014-11-07 ENCOUNTER — Emergency Department (HOSPITAL_COMMUNITY): Payer: Commercial Managed Care - HMO

## 2014-11-07 ENCOUNTER — Encounter (HOSPITAL_COMMUNITY): Payer: Self-pay | Admitting: *Deleted

## 2014-11-07 DIAGNOSIS — Z66 Do not resuscitate: Secondary | ICD-10-CM | POA: Diagnosis not present

## 2014-11-07 DIAGNOSIS — I5033 Acute on chronic diastolic (congestive) heart failure: Principal | ICD-10-CM | POA: Diagnosis present

## 2014-11-07 DIAGNOSIS — R627 Adult failure to thrive: Secondary | ICD-10-CM | POA: Insufficient documentation

## 2014-11-07 DIAGNOSIS — N184 Chronic kidney disease, stage 4 (severe): Secondary | ICD-10-CM | POA: Diagnosis not present

## 2014-11-07 DIAGNOSIS — I480 Paroxysmal atrial fibrillation: Secondary | ICD-10-CM | POA: Insufficient documentation

## 2014-11-07 DIAGNOSIS — Z87891 Personal history of nicotine dependence: Secondary | ICD-10-CM | POA: Insufficient documentation

## 2014-11-07 DIAGNOSIS — Z7901 Long term (current) use of anticoagulants: Secondary | ICD-10-CM | POA: Diagnosis not present

## 2014-11-07 DIAGNOSIS — Z853 Personal history of malignant neoplasm of breast: Secondary | ICD-10-CM | POA: Diagnosis not present

## 2014-11-07 DIAGNOSIS — E780 Pure hypercholesterolemia: Secondary | ICD-10-CM | POA: Insufficient documentation

## 2014-11-07 DIAGNOSIS — I129 Hypertensive chronic kidney disease with stage 1 through stage 4 chronic kidney disease, or unspecified chronic kidney disease: Secondary | ICD-10-CM | POA: Diagnosis not present

## 2014-11-07 DIAGNOSIS — J90 Pleural effusion, not elsewhere classified: Secondary | ICD-10-CM | POA: Diagnosis present

## 2014-11-07 DIAGNOSIS — E119 Type 2 diabetes mellitus without complications: Secondary | ICD-10-CM | POA: Insufficient documentation

## 2014-11-07 DIAGNOSIS — Z9013 Acquired absence of bilateral breasts and nipples: Secondary | ICD-10-CM | POA: Insufficient documentation

## 2014-11-07 DIAGNOSIS — R0602 Shortness of breath: Secondary | ICD-10-CM | POA: Diagnosis present

## 2014-11-07 LAB — CBC WITH DIFFERENTIAL/PLATELET
BASOS PCT: 0 % (ref 0–1)
Basophils Absolute: 0 10*3/uL (ref 0.0–0.1)
EOS PCT: 2 % (ref 0–5)
Eosinophils Absolute: 0.1 10*3/uL (ref 0.0–0.7)
HCT: 32.7 % — ABNORMAL LOW (ref 36.0–46.0)
HEMOGLOBIN: 10.6 g/dL — AB (ref 12.0–15.0)
Lymphocytes Relative: 8 % — ABNORMAL LOW (ref 12–46)
Lymphs Abs: 0.5 10*3/uL — ABNORMAL LOW (ref 0.7–4.0)
MCH: 28.3 pg (ref 26.0–34.0)
MCHC: 32.4 g/dL (ref 30.0–36.0)
MCV: 87.4 fL (ref 78.0–100.0)
MONO ABS: 0.9 10*3/uL (ref 0.1–1.0)
Monocytes Relative: 15 % — ABNORMAL HIGH (ref 3–12)
NEUTROS ABS: 4.6 10*3/uL (ref 1.7–7.7)
NEUTROS PCT: 75 % (ref 43–77)
PLATELETS: ADEQUATE 10*3/uL (ref 150–400)
RBC: 3.74 MIL/uL — ABNORMAL LOW (ref 3.87–5.11)
RDW: 16.4 % — ABNORMAL HIGH (ref 11.5–15.5)
WBC: 6.1 10*3/uL (ref 4.0–10.5)

## 2014-11-07 LAB — COMPREHENSIVE METABOLIC PANEL
ALT: 35 U/L (ref 14–54)
AST: 37 U/L (ref 15–41)
Albumin: 2.7 g/dL — ABNORMAL LOW (ref 3.5–5.0)
Alkaline Phosphatase: 83 U/L (ref 38–126)
Anion gap: 9 (ref 5–15)
BILIRUBIN TOTAL: 1 mg/dL (ref 0.3–1.2)
BUN: 52 mg/dL — AB (ref 6–20)
CO2: 22 mmol/L (ref 22–32)
CREATININE: 2.46 mg/dL — AB (ref 0.44–1.00)
Calcium: 9 mg/dL (ref 8.9–10.3)
Chloride: 110 mmol/L (ref 101–111)
GFR calc Af Amer: 20 mL/min — ABNORMAL LOW (ref >60)
GFR, EST NON AFRICAN AMERICAN: 18 mL/min — AB (ref >60)
Glucose, Bld: 101 mg/dL — ABNORMAL HIGH (ref 65–99)
Potassium: 3.9 mmol/L (ref 3.5–5.1)
Sodium: 141 mmol/L (ref 135–145)
TOTAL PROTEIN: 6.6 g/dL (ref 6.5–8.1)

## 2014-11-07 LAB — I-STAT TROPONIN, ED
TROPONIN I, POC: 0.57 ng/mL — AB (ref 0.00–0.08)
Troponin i, poc: 0.57 ng/mL (ref 0.00–0.08)

## 2014-11-07 LAB — BRAIN NATRIURETIC PEPTIDE: B Natriuretic Peptide: 1275.4 pg/mL — ABNORMAL HIGH (ref 0.0–100.0)

## 2014-11-07 LAB — TROPONIN I: TROPONIN I: 0.95 ng/mL — AB (ref <0.031)

## 2014-11-07 LAB — PROTIME-INR
INR: 2.54 — ABNORMAL HIGH (ref 0.00–1.49)
Prothrombin Time: 27 seconds — ABNORMAL HIGH (ref 11.6–15.2)

## 2014-11-07 MED ORDER — ANASTROZOLE 1 MG PO TABS
1.0000 mg | ORAL_TABLET | Freq: Every day | ORAL | Status: DC
  Administered 2014-11-08: 1 mg via ORAL
  Filled 2014-11-07: qty 1

## 2014-11-07 MED ORDER — WARFARIN SODIUM 2 MG PO TABS: 2.0000 mg | ORAL_TABLET | Freq: Every day | ORAL | Status: DC

## 2014-11-07 MED ORDER — AMIODARONE HCL 200 MG PO TABS
200.0000 mg | ORAL_TABLET | Freq: Every day | ORAL | Status: DC
  Administered 2014-11-08: 200 mg via ORAL
  Filled 2014-11-07: qty 1

## 2014-11-07 MED ORDER — LINAGLIPTIN 5 MG PO TABS
5.0000 mg | ORAL_TABLET | Freq: Every day | ORAL | Status: DC
  Administered 2014-11-08: 5 mg via ORAL
  Filled 2014-11-07: qty 1

## 2014-11-07 MED ORDER — FUROSEMIDE 10 MG/ML IJ SOLN
80.0000 mg | Freq: Once | INTRAMUSCULAR | Status: AC
  Administered 2014-11-07: 80 mg via INTRAVENOUS
  Filled 2014-11-07: qty 8

## 2014-11-07 MED ORDER — WARFARIN SODIUM 4 MG PO TABS
2.0000 mg | ORAL_TABLET | Freq: Every day | ORAL | Status: DC
  Administered 2014-11-07 – 2014-11-08 (×2): 2 mg via ORAL
  Filled 2014-11-07 (×2): qty 0.5

## 2014-11-07 MED ORDER — FERROUS SULFATE 325 (65 FE) MG PO TABS
325.0000 mg | ORAL_TABLET | Freq: Three times a day (TID) | ORAL | Status: DC
  Administered 2014-11-08 (×3): 325 mg via ORAL
  Filled 2014-11-07 (×3): qty 1

## 2014-11-07 MED ORDER — METOPROLOL TARTRATE 12.5 MG HALF TABLET
12.5000 mg | ORAL_TABLET | Freq: Two times a day (BID) | ORAL | Status: DC
  Administered 2014-11-08: 12.5 mg via ORAL
  Filled 2014-11-07 (×2): qty 1

## 2014-11-07 MED ORDER — WARFARIN - PHARMACIST DOSING INPATIENT: Freq: Every day | Status: DC

## 2014-11-07 MED ORDER — ALBUTEROL SULFATE (2.5 MG/3ML) 0.083% IN NEBU
2.5000 mg | INHALATION_SOLUTION | Freq: Four times a day (QID) | RESPIRATORY_TRACT | Status: DC | PRN
  Administered 2014-11-08: 2.5 mg via RESPIRATORY_TRACT
  Filled 2014-11-07: qty 3

## 2014-11-07 MED ORDER — ALBUTEROL SULFATE HFA 108 (90 BASE) MCG/ACT IN AERS: 2.0000 | INHALATION_SPRAY | RESPIRATORY_TRACT | Status: DC | PRN

## 2014-11-07 MED ORDER — ADULT MULTIVITAMIN W/MINERALS CH
1.0000 | ORAL_TABLET | ORAL | Status: DC
  Administered 2014-11-07: 1 via ORAL
  Filled 2014-11-07 (×2): qty 1

## 2014-11-07 MED ORDER — ACETAMINOPHEN 500 MG PO TABS: 500.0000 mg | ORAL_TABLET | Freq: Two times a day (BID) | ORAL | Status: DC | PRN

## 2014-11-07 MED ORDER — VITAMIN B-1 100 MG PO TABS
100.0000 mg | ORAL_TABLET | Freq: Every day | ORAL | Status: DC
  Administered 2014-11-07 – 2014-11-08 (×2): 100 mg via ORAL
  Filled 2014-11-07 (×3): qty 1

## 2014-11-07 MED ORDER — ALBUTEROL SULFATE (2.5 MG/3ML) 0.083% IN NEBU
5.0000 mg | INHALATION_SOLUTION | Freq: Once | RESPIRATORY_TRACT | Status: AC
  Administered 2014-11-07: 5 mg via RESPIRATORY_TRACT
  Filled 2014-11-07: qty 6

## 2014-11-07 MED ORDER — FENTANYL CITRATE (PF) 100 MCG/2ML IJ SOLN
50.0000 ug | Freq: Once | INTRAMUSCULAR | Status: DC
  Filled 2014-11-07: qty 2

## 2014-11-07 MED ORDER — ENSURE ENLIVE PO LIQD
237.0000 mL | Freq: Two times a day (BID) | ORAL | Status: DC
  Administered 2014-11-08: 237 mL via ORAL

## 2014-11-07 MED ORDER — LORAZEPAM 2 MG/ML IJ SOLN
0.5000 mg | Freq: Once | INTRAMUSCULAR | Status: AC
  Administered 2014-11-07: 0.5 mg via INTRAVENOUS
  Filled 2014-11-07: qty 1

## 2014-11-07 MED ORDER — ASPIRIN 81 MG PO CHEW
324.0000 mg | CHEWABLE_TABLET | Freq: Once | ORAL | Status: AC
  Administered 2014-11-07: 324 mg via ORAL
  Filled 2014-11-07: qty 4

## 2014-11-07 NOTE — Progress Notes (Signed)
ANTICOAGULATION CONSULT NOTE - Initial Consult  Pharmacy Consult for coumadin Indication: atrial fibrillation  No Known Allergies  Patient Measurements:   Heparin Dosing Weight:   Vital Signs: Temp: 98.7 F (37.1 C) (08/30 1105) Temp Source: Oral (08/30 1105) BP: 128/61 mmHg (08/30 1945) Pulse Rate: 74 (08/30 1945)  Labs:  Recent Labs  11/05/14 0434 11/05/14 1100 11/07/14 1206 11/07/14 1305 11/07/14 1627  HGB  --   --  10.6*  --   --   HCT  --   --  32.7*  --   --   PLT  --   --  PLATELET CLUMPS NOTED ON SMEAR, COUNT APPEARS ADEQUATE  --   --   LABPROT 24.9*  --   --  27.0*  --   INR 2.29*  --   --  2.54*  --   CREATININE  --  2.59* 2.46*  --   --   TROPONINI  --   --   --   --  0.95*    Estimated Creatinine Clearance: 16 mL/min (by C-G formula based on Cr of 2.46).   Medical History: Past Medical History  Diagnosis Date  . Hypercholesterolemia     takes Crestor daily  . Thyroid disease     had iodine radiation  . Hypertension     takes Hyzaar daily  . Dysrhythmia     takes Carvedilol daily  . Pneumonia     history of;last time about 4-38yr ago  . GERD (gastroesophageal reflux disease)     takes Protonix daily  . Gastric ulcer   . History of blood transfusion   . History of colon polyps   . Anemia     takes iron pill daily  . Diabetes mellitus without complication     takes Tradjenta daily  . History of radiation therapy 07/12/12-08/26/12    right breast/  . Paroxysmal atrial fibrillation     PCP EKG 09/27/2013: A. Fibrillation.. EKG 09/29/2013: S. Tach.  . Breast cancer 04/12/12    right-pos lymph node/left-DCIS  . Shortness of breath on exertion   . Bruit of left carotid artery   . Acute upper GI bleeding 01/24/2012  . MGUS (monoclonal gammopathy of unknown significance) 04/21/2013  . Osteoporosis 11/29/2013  . Recurrent right pleural effusion 07/26/2014  . Stage III chronic kidney disease 07/26/2014  . Goiter 07/27/2014  . Substernal goiter  12/23/2012  . Pleural effusion, right 12/23/2012  . Type 2 diabetes mellitus with renal manifestations 07/26/2014  . Dysphonia 09/20/2014  . Vocal cord paralysis     Suspected due to massive substernal goiter  . CHF (congestive heart failure)     Medications:  Scheduled:  . amiodarone  200 mg Oral Daily  . anastrozole  1 mg Oral Daily  . [START ON 11/08/2014] feeding supplement (ENSURE ENLIVE)  237 mL Oral BID BM  . fentaNYL (SUBLIMAZE) injection  50 mcg Intravenous Once  . [START ON 11/08/2014] ferrous sulfate  325 mg Oral TID WC  . linagliptin  5 mg Oral Daily  . metoprolol tartrate  12.5 mg Oral BID  . multivitamin with minerals  1 tablet Oral QODAY  . thiamine  100 mg Oral Daily  . [START ON 11/08/2014] warfarin  2 mg Oral q1800   Infusions:    Assessment: 81yo female with afib will be continued on coumadin.  INR today is 2.54.  Patient was on coumadin 2 mg po daily (last dose was 11/06/14)  Goal of Therapy:  INR 2-3 Monitor platelets by anticoagulation protocol: Yes   Plan:  - continue coumadin 2 mg po daily - INR in am  Jeffery Bachmeier, Tsz-Yin 11/07/2014,8:22 PM

## 2014-11-07 NOTE — ED Notes (Signed)
PA at bedside.

## 2014-11-07 NOTE — ED Notes (Signed)
MD at bedside. 

## 2014-11-07 NOTE — ED Notes (Signed)
Dr. Mila Palmer office stated he was in the cath lab doing a procdure; I paged him at [phone].

## 2014-11-07 NOTE — ED Notes (Signed)
Critical Troponin-0.95 PA Posey Pronto made aware

## 2014-11-07 NOTE — ED Provider Notes (Signed)
  Physical Exam  BP 134/62 mmHg  Pulse 80  Temp(Src) 98.7 F (37.1 C) (Oral)  Resp 20  SpO2 96%  Physical Exam  ED Course  Procedures  MDM  Pt comes in with cc of dib and chest tightness. She has an elevated troponin. PE unlikely. Will admit for further evaluation and assessment. Cards and medicine working on the admitting.      Varney Biles, MD 11/07/14 [phone]

## 2014-11-07 NOTE — ED Notes (Signed)
Pt presents via [facility] for c/o shortness of breath since Sunday. Pt with hx CHF and was admitted last week for dehydration, torsemide d/c.  Pt reports SOB since being discharged.  Inspiratory/Expiratory wheezing in all fields, 10 albuterol and 0.5 atrovent administered by EMS, decreased wheezing but still present.  Pt denies pain/N/V.  Pt a x 4, NAD on arrival.  97% RA.  BP-112/82 P-66 NSR.

## 2014-11-07 NOTE — H&P (Addendum)
Chief Complaint:  Alison Gross is a 81 year old African-American female with history of hypertension, diabetes mellitus, and history of breast cancer status post bilateral mastectomy in the remote past, retrosternal Goitre was initially referred to me for evaluation of atrial fibrillation detected during normal physical examination on 09/07/2013. She converted to sinus rhythm spontaneously.  She was recently admitted to the hospital on 09/19/2014 with shortness of breath and diagnosed with pleural effusion which has been recurrent over the past year secondary to a very large thyroid goiter. While in the hospital, she had a central venous catheter placed and Xarelto was held in order to perform thoracentesis. She subsequently developed DVTs to the right axillary and subclavian veins. Due to declining renal function from acute illness, Xarelto was no longer appropriate and presently on warfarin. Her INR is being checked at home by home health nurse. She was also started on Amiodarone for A. Fib with RVR. She currently denies any shortness of breath and has a Pleurx drain in place with home health nurse coming out to house 3 times a week to remove collected fluid.  I had admitted her on 10/30/2014 and discharged her on 11/04/2014 to evaluate and see whether her renal function would improve with hydration and also to coordinate the care. She had been doing relatively stable, this afternoon after physical activity developed significant wheezing and hence presented to the emergency room. She has been wheezing recently with heavy physical activity and was also noted during her recent hospital stay. Her renal dysfunction was felt to be due to recurrent pleural effusion and continued pleural drain, anywhere from 600-700 mL daily.  I was called to opine regarding positive serum troponin. Patient denies any chest pain. She has chronic orthopnea. She has Perurx in place right. She underwent  talc pleurodesis on  11/02/2014 by Dr. Ceasar Mons.  Past Medical History  Diagnosis Date  . Hypercholesterolemia     takes Crestor daily  . Thyroid disease     had iodine radiation  . Hypertension     takes Hyzaar daily  . Dysrhythmia     takes Carvedilol daily  . Pneumonia     history of;last time about 4-59yr ago  . GERD (gastroesophageal reflux disease)     takes Protonix daily  . Gastric ulcer   . History of blood transfusion   . History of colon polyps   . Anemia     takes iron pill daily  . Diabetes mellitus without complication     takes Tradjenta daily  . History of radiation therapy 07/12/12-08/26/12    right breast/  . Paroxysmal atrial fibrillation     PCP EKG 09/27/2013: A. Fibrillation.. EKG 09/29/2013: S. Tach.  . Breast cancer 04/12/12    right-pos lymph node/left-DCIS  . Shortness of breath on exertion   . Bruit of left carotid artery   . Acute upper GI bleeding 01/24/2012  . MGUS (monoclonal gammopathy of unknown significance) 04/21/2013  . Osteoporosis 11/29/2013  . Recurrent right pleural effusion 07/26/2014  . Stage III chronic kidney disease 07/26/2014  . Goiter 07/27/2014  . Substernal goiter 12/23/2012  . Pleural effusion, right 12/23/2012  . Type 2 diabetes mellitus with renal manifestations 07/26/2014  . Dysphonia 09/20/2014  . Vocal cord paralysis     Suspected due to massive substernal goiter  . CHF (congestive heart failure)     Past Surgical History  Procedure Laterality Date  . Carpal tunnel release      left  . Breast  biopsy    . Esophagogastroduodenoscopy  01/24/2012    Procedure: ESOPHAGOGASTRODUODENOSCOPY (EGD);  Surgeon: Jeryl Columbia, MD;  Location: [facility];  Service: Endoscopy;  Laterality: N/A;  . Thyroid goiter removed    . Cyst removed from back of neck    . Knee surgery      right with rods  . Esophagogastroduodenoscopy    . Colonoscopy    . Total mastectomy Bilateral 05/10/2012    Procedure: RIGHT modified mastectomy; LEFT total mastectomy;   Surgeon: [name], MD;  Location: [facility];  Service: General;  Laterality: Bilateral;  . Axillary sentinel node biopsy Right 05/10/2012    Procedure: AXILLARY SENTINEL lymph  NODE BIOPSY;  Surgeon: [name], MD;  Location: [place];  Service: General;  Laterality: Right;  nuclear medicine injection right side  7:00 am   . Abdominal hysterectomy      fibroids, with bilateral SO  . Chest tube insertion Right 09/27/2014    Procedure: INSERTION OF RIGHT PLEURAL DRAINAGE CATHETER;  Surgeon: [name], MD;  Location: [place];  Service: Thoracic;  Laterality: Right;    Family History  Problem Relation Age of Onset  . Brain cancer Mother   . Aneurysm Father   . Diabetes Sister   . Diabetes Brother   . Diabetes Sister   . Diabetes Brother   . Heart failure Sister    Social History:  reports that she quit smoking about 43 years ago. Her smoking use included Cigarettes. She smoked 0.00 packs per day. She has never used smokeless tobacco. She reports that she does not drink alcohol or use illicit drugs.  Allergies: No Known Allergies  Review of Systems - shortness of breath, fatigue, worsening leg edema. Other systems negative.  Blood pressure 129/71, pulse 77, temperature 98.7 F (37.1 C), temperature source Oral, resp. rate 20, SpO2 100 %. General appearance: alert, cooperative, appears stated age, no distress and appears  wasted Eyes: negative findings: lids and lashes normal Neck: no carotid bruit, no JVD and supple, symmetrical, trachea midline Neck: JVP - normal, carotids 2+= without bruits Resp: decreased breath sounds bilaterally more than halfway at the bases. Chest wall: no tenderness Cardio: S1, S2 normal, no S3 or S4 and systolic murmur: early systolic 3/6, crescendo at lower left sternal border, at apex GI: soft, non-tender; bowel sounds normal; no masses,  no organomegaly Extremities: edema 2+, nontender. Full range of motion in extremities. Pulses: No carotid  bruit, femoral pulses normal. Pedal pulses faint. Skin: Skin color, texture, turgor normal. No rashes or lesions Neurologic: Grossly normal  Results for orders placed or performed during the hospital encounter of 11/07/14 (from the past 48 hour(s))  I-stat troponin, ED     Status: Abnormal   Collection Time: 11/07/14 11:58 AM  Result Value Ref Range   Troponin i, poc 0.57 (HH) 0.00 - 0.08 ng/mL   Comment NOTIFIED PHYSICIAN    Comment 3            Comment: Due to the release kinetics of cTnI, a negative result within the first hours of the onset of symptoms does not rule out myocardial infarction with certainty. If myocardial infarction is still suspected, repeat the test at appropriate intervals.   CBC with Differential     Status: Abnormal   Collection Time: 11/07/14 12:06 PM  Result Value Ref Range   WBC 6.1 4.0 - 10.5 K/uL    Comment: REPEATED TO VERIFY WHITE COUNT CONFIRMED ON SMEAR  RBC 3.74 (L) 3.87 - 5.11 MIL/uL   Hemoglobin 10.6 (L) 12.0 - 15.0 g/dL    Comment: REPEATED TO VERIFY   HCT 32.7 (L) 36.0 - 46.0 %   MCV 87.4 78.0 - 100.0 fL   MCH 28.3 26.0 - 34.0 pg   MCHC 32.4 30.0 - 36.0 g/dL   RDW 16.4 (H) 11.5 - 15.5 %   Platelets  150 - 400 K/uL    PLATELET CLUMPS NOTED ON SMEAR, COUNT APPEARS ADEQUATE   Neutrophils Relative % 75 43 - 77 %   Lymphocytes Relative 8 (L) 12 - 46 %   Monocytes Relative 15 (H) 3 - 12 %   Eosinophils Relative 2 0 - 5 %   Basophils Relative 0 0 - 1 %   Neutro Abs 4.6 1.7 - 7.7 K/uL   Lymphs Abs 0.5 (L) 0.7 - 4.0 K/uL   Monocytes Absolute 0.9 0.1 - 1.0 K/uL   Eosinophils Absolute 0.1 0.0 - 0.7 K/uL   Basophils Absolute 0.0 0.0 - 0.1 K/uL   RBC Morphology ELLIPTOCYTES     Comment: BURR CELLS  Comprehensive metabolic panel     Status: Abnormal   Collection Time: 11/07/14 12:06 PM  Result Value Ref Range   Sodium 141 135 - 145 mmol/L   Potassium 3.9 3.5 - 5.1 mmol/L   Chloride 110 101 - 111 mmol/L   CO2 22 22 - 32 mmol/L   Glucose,  Bld 101 (H) 65 - 99 mg/dL   BUN 52 (H) 6 - 20 mg/dL   Creatinine, Ser 2.46 (H) 0.44 - 1.00 mg/dL   Calcium 9.0 8.9 - 10.3 mg/dL   Total Protein 6.6 6.5 - 8.1 g/dL   Albumin 2.7 (L) 3.5 - 5.0 g/dL   AST 37 15 - 41 U/L   ALT 35 14 - 54 U/L   Alkaline Phosphatase 83 38 - 126 U/L   Total Bilirubin 1.0 0.3 - 1.2 mg/dL   GFR calc non Af Amer 18 (L) >60 mL/min   GFR calc Af Amer 20 (L) >60 mL/min    Comment: (NOTE) The eGFR has been calculated using the CKD EPI equation. This calculation has not been validated in all clinical situations. eGFR's persistently <60 mL/min signify possible Chronic Kidney Disease.    Anion gap 9 5 - 15  Brain natriuretic peptide     Status: Abnormal   Collection Time: 11/07/14 12:07 PM  Result Value Ref Range   B Natriuretic Peptide 1275.4 (H) 0.0 - 100.0 pg/mL  Protime-INR     Status: Abnormal   Collection Time: 11/07/14  1:05 PM  Result Value Ref Range   Prothrombin Time 27.0 (H) 11.6 - 15.2 seconds   INR 2.54 (H) 0.00 - 1.49  Troponin I     Status: Abnormal   Collection Time: 11/07/14  4:27 PM  Result Value Ref Range   Troponin I 0.95 (HH) <0.031 ng/mL    Comment:        POSSIBLE MYOCARDIAL ISCHEMIA. SERIAL TESTING RECOMMENDED. CRITICAL RESULT CALLED TO, READ BACK BY AND VERIFIED WITH: H SHARPE,RN 1723 11/07/2014 WBOND   I-stat troponin, ED     Status: Abnormal   Collection Time: 11/07/14  4:37 PM  Result Value Ref Range   Troponin i, poc 0.57 (HH) 0.00 - 0.08 ng/mL   Comment NOTIFIED PHYSICIAN    Comment 3            Comment: Due to the release kinetics of cTnI, a negative result within  the first hours of the onset of symptoms does not rule out myocardial infarction with certainty. If myocardial infarction is still suspected, repeat the test at appropriate intervals.    Dg Chest 2 View  11/07/2014   CLINICAL DATA:  Short of breath.  Chest tightness.  EXAM: CHEST  2 VIEW  COMPARISON:  11/03/2014  FINDINGS: Large mass lesion in the right  medial lung apex unchanged from the prior study. This represents a thyroid mass based on prior CT. Mild to moderate right pleural effusion is stable. Pleural catheter in the right lung base is unchanged in position. No pneumothorax. Right lower lobe atelectasis is unchanged.  Small left effusion is unchanged.  Negative for heart failure.  IMPRESSION: No significant change from the prior study. Large right upper lobe mass compatible goiter is stable. Right pleural effusion and right pleural catheter unchanged. No pneumothorax.   Electronically Signed   By: Franchot Gallo M.D.   On: 11/07/2014 12:24    Labs:   Lab Results  Component Value Date   WBC 6.1 11/07/2014   HGB 10.6* 11/07/2014   HCT 32.7* 11/07/2014   MCV 87.4 11/07/2014   PLT  11/07/2014    PLATELET CLUMPS NOTED ON SMEAR, COUNT APPEARS ADEQUATE    Recent Labs  09/20/14 1745 09/28/14 0238 11/07/14 1207  BNP 808.4* 947.8* 1275.4*    HEMOGLOBIN A1C Lab Results  Component Value Date   HGBA1C 6.2* 07/27/2014   MPG 131 07/27/2014    Cardiac Panel (last 3 results)  Recent Labs  09/21/14 0030 09/21/14 0530 11/07/14 1627  TROPONINI 0.30* 0.29* 0.95*    Lab Results  Component Value Date   TROPONINI 0.95* 11/07/2014     TSH  Recent Labs  07/27/14 0515 08/23/14 0822  TSH 1.036 1.115     Current facility-administered medications:  .  acetaminophen (TYLENOL) tablet 500 mg, 500 mg, Oral, BID PRN, Adrian Prows, MD .  albuterol (PROVENTIL HFA;VENTOLIN HFA) 108 (90 BASE) MCG/ACT inhaler 2 puff, 2 puff, Inhalation, Q4H PRN, Adrian Prows, MD .  amiodarone (PACERONE) tablet 200 mg, 200 mg, Oral, Daily, Adrian Prows, MD .  anastrozole (ARIMIDEX) tablet 1 mg, 1 mg, Oral, Daily, Adrian Prows, MD .  Derrill Memo ON 11/08/2014] feeding supplement (ENSURE ENLIVE) (ENSURE ENLIVE) liquid 237 mL, 237 mL, Oral, BID BM, Adrian Prows, MD .  fentaNYL (SUBLIMAZE) injection 50 mcg, 50 mcg, Intravenous, Once, Adrian Prows, MD .  Derrill Memo ON 11/08/2014] ferrous  sulfate tablet 325 mg, 325 mg, Oral, TID WC, Adrian Prows, MD .  linagliptin (TRADJENTA) tablet 5 mg, 5 mg, Oral, Daily, Adrian Prows, MD .  LORazepam (ATIVAN) injection 0.5 mg, 0.5 mg, Intravenous, Once, Adrian Prows, MD .  multivitamin with minerals tablet 1 tablet, 1 tablet, Oral, QODAY, Adrian Prows, MD .  thiamine (VITAMIN B-1) tablet 100 mg, 100 mg, Oral, Daily, Adrian Prows, MD .  Derrill Memo ON 11/08/2014] warfarin (COUMADIN) tablet 2 mg, 2 mg, Oral, q1800, Adrian Prows, MD  Current outpatient prescriptions:  .  acetaminophen (TYLENOL) 500 MG tablet, Take 500 mg by mouth 2 (two) times daily as needed for moderate pain or headache. , Disp: , Rfl:  .  amiodarone (PACERONE) 200 MG tablet, Take 1 tablet (200 mg total) by mouth daily., Disp: 60 tablet, Rfl: 0 .  anastrozole (ARIMIDEX) 1 MG tablet, TAKE 1 TABLET (1 MG TOTAL) BY MOUTH DAILY., Disp: 30 tablet, Rfl: 5 .  Cholecalciferol (VITAMIN D) 2000 UNITS CAPS, Take by mouth., Disp: , Rfl:  .  feeding supplement, ENSURE ENLIVE, (ENSURE ENLIVE) LIQD, Take 237 mLs by mouth 2 (two) times daily between meals., Disp: 237 mL, Rfl: 12 .  ferrous sulfate 325 (65 FE) MG tablet, Take 1 tablet (325 mg total) by mouth 3 (three) times daily with meals., Disp: 90 tablet, Rfl: 0 .  linagliptin (TRADJENTA) 5 MG TABS tablet, Take 5 mg by mouth daily., Disp: , Rfl:  .  Multiple Vitamin (MULTIVITAMIN WITH MINERALS) TABS, Take 1 tablet by mouth every other day. , Disp: , Rfl:  .  thiamine 100 MG tablet, Take 1 tablet (100 mg total) by mouth daily., Disp: 90 tablet, Rfl: 0 .  VENTOLIN HFA 108 (90 BASE) MCG/ACT inhaler, Inhale 2 puffs into the lungs every 4 (four) hours as needed for wheezing or shortness of breath. , Disp: , Rfl: 0 .  warfarin (COUMADIN) 2 MG tablet, Take 2 mg by mouth daily at 6 PM. , Disp: , Rfl: 0   Echocardiogram 09/26/2014: LVEF 60-65% with grade 2 diastolic dysfunction, quadricuspid aortic valve, mild mitral regurgitation, severe left atrial enlargement. Lexi scan  sestamibi stress test 09/25/2014: Perfusion: Small fixed defect at the inferior lateral wall on rest and perfusion images. No reversible ischemia identified.  Wall Motion: Normal left ventricular wall motion. No left ventricular dilation.   Left Ventricular Ejection Fraction: 62 %  EKG: 11/07/2014 no change from 09/24/2014: Normal sinus rhythm, left axis deviation, left anterior fascicular block. Poor RV progression, LVH with repolarization abnormality.  Assessment/Plan 1. shortness of breath and dyspnea on exertion of multifactorial etiology, mostly related to mechanical issues of recurrent pleural effusion, recent talc pleurodesis right,  acute on chronic diastolic heart failure due to underlying medical issues,  Stage 4 chronic kidney disease, probably mechanical obstruction caused by retrosternal thyroid leading to significant bronchospasm.  Positive serum troponin probably related to acute on chronic diastolic heart failure, do not suspect ACS.  2.  Recurrent pleural effusion, exudative type, patient presently has PleuRx catheter. Dr. Servando Snare has been consulted for management of same and to consider further therapy. 3. Paroxysmal atrial fibrillation, intense sinus rhythm. INR therapeutic. CHA2DS2-VASc Score is 5 with yearly risk of stroke of 6.7% per year.  4. Failure to thrive in an adult  Recommendation: Will admit the patient for observation, not much that I can offer from cardiac standpoint, she develops significant renal insufficiency with diuresis and she is intravascularly volume depleted from frequent pleurocentesis and pleural drainage from the Plerux. I will try to gently diurese her. She also ate salty food 2 days ago and this may have contributed to acute decompensation.  I had a long discussion with the husband regarding end-of-life issues. I will consult palliative care. We agreed on making her DO NOT RESUSCITATE for now. I'll continue to be supportive from cardiac standpoint.  Even if she ends up with ACS, I will not be able to perform any cardiac procedures, a shunt is chronic orthopnea and inability to lay flat, stage IV kidney disease, multiple medical comorbidities. These issues were discussed with the patient and also her husband. She is already on Coumadin and would not recommend adding addition of aspirin due to bleeding risk. Her blood pressure has always been borderline and low, will try to restart small dose beta blocker which was discontinued on her previous admission a week ago.  I will also try to set her up for home oxygen therapy to see whether this would be helpful in managing her symptoms.   Adrian Prows, MD 11/07/2014, 6:58  PM [facility] [place] Pager: [phone] Office: [phone] If no answer: Cell:  [phone]

## 2014-11-07 NOTE — ED Provider Notes (Signed)
CSN: [id number]     Arrival date & time 11/07/14  1054 History   First MD Initiated Contact with Patient 11/07/14 1055     Chief Complaint  Patient presents with  . Shortness of Breath     (Consider location/radiation/quality/duration/timing/severity/associated sxs/prior Treatment) HPI Comments: Pt is a 81 yo female with history of CHF, chronic renal insufficiency, goiter who presents to the ED via EMS with complaint of SOB, onset Sunday. Pt was discharged from the hospital on Sunday and reports that she continued to feel SOB. Endorses chest tightness, weakness, fatigue, decreased appetite and lower extremity swelling. Denies fever, headache, abdominal pain, N/V/D, numbness, tingling. She has not seen her PCP yet but is scheduled to see him in 2 days and follow up with her Pulmonologist 9/8. Pt reports she has used her Albuterol inhaler at home with mild relief. Pt given 10 albuterol and 0.5 atrovent via EMS and reports improvement in breathing.    Past Medical History  Diagnosis Date  . Hypercholesterolemia     takes Crestor daily  . Thyroid disease     had iodine radiation  . Hypertension     takes Hyzaar daily  . Dysrhythmia     takes Carvedilol daily  . Pneumonia     history of;last time about 4-34yr ago  . GERD (gastroesophageal reflux disease)     takes Protonix daily  . Gastric ulcer   . History of blood transfusion   . History of colon polyps   . Anemia     takes iron pill daily  . Diabetes mellitus without complication     takes Tradjenta daily  . History of radiation therapy 07/12/12-08/26/12    right breast/  . Paroxysmal atrial fibrillation     PCP EKG 09/27/2013: A. Fibrillation.. EKG 09/29/2013: S. Tach.  . Breast cancer 04/12/12    right-pos lymph node/left-DCIS  . Shortness of breath on exertion   . Bruit of left carotid artery   . Acute upper GI bleeding 01/24/2012  . MGUS (monoclonal gammopathy of unknown significance) 04/21/2013  . Osteoporosis 11/29/2013  .  Recurrent right pleural effusion 07/26/2014  . Stage III chronic kidney disease 07/26/2014  . Goiter 07/27/2014  . Substernal goiter 12/23/2012  . Pleural effusion, right 12/23/2012  . Type 2 diabetes mellitus with renal manifestations 07/26/2014  . Dysphonia 09/20/2014  . Vocal cord paralysis     Suspected due to massive substernal goiter  . CHF (congestive heart failure)    Past Surgical History  Procedure Laterality Date  . Carpal tunnel release      left  . Breast biopsy    . Esophagogastroduodenoscopy  01/24/2012    Procedure: ESOPHAGOGASTRODUODENOSCOPY (EGD);  Surgeon: MJeryl Columbia MD;  [facility];  Service: Endoscopy;  Laterality: N/A;  . Thyroid goiter removed    . Cyst removed from back of neck    . Knee surgery      right with rods  . Esophagogastroduodenoscopy    . Colonoscopy    . Total mastectomy Bilateral 05/10/2012    Procedure: RIGHT modified mastectomy; LEFT total mastectomy;  Surgeon: CHaywood Lasso MD;  [facility]  Service: General;  Laterality: Bilateral;  . Axillary sentinel node biopsy Right 05/10/2012    Procedure: AXILLARY SENTINEL lymph  NODE BIOPSY;  Surgeon: CHaywood Lasso MD;  [facility]  Service: General;  Laterality: Right;  nuclear medicine injection right side  7:00 am   . Abdominal hysterectomy  fibroids, with bilateral SO  . Chest tube insertion Right 09/27/2014    Procedure: INSERTION OF RIGHT PLEURAL DRAINAGE CATHETER;  Surgeon: [name], MD;  Location: [facility];  Service: Thoracic;  Laterality: Right;   Family History  Problem Relation Age of Onset  . Brain cancer Mother   . Aneurysm Father   . Diabetes Sister   . Diabetes Brother   . Diabetes Sister   . Diabetes Brother   . Heart failure Sister    Social History  Substance Use Topics  . Smoking status: Former Smoker -- 0.00 packs/day    Types: Cigarettes    Quit date: 07/26/1971  . Smokeless tobacco: Never Used  . Alcohol Use: No   OB History     No data available     Review of Systems  Constitutional: Positive for appetite change and fatigue.  Respiratory: Positive for chest tightness, shortness of breath and wheezing.   Cardiovascular: Positive for leg swelling.  Neurological: Positive for weakness.  All other systems reviewed and are negative.     Allergies  Review of patient's allergies indicates no known allergies.  Home Medications   Prior to Admission medications   Medication Sig Start Date End Date Taking? Authorizing Provider  acetaminophen (TYLENOL) 500 MG tablet Take 500 mg by mouth 2 (two) times daily as needed for moderate pain or headache.     Historical Provider, MD  amiodarone (PACERONE) 200 MG tablet Take 1 tablet (200 mg total) by mouth daily. 11/04/14   Adrian Prows, MD  anastrozole (ARIMIDEX) 1 MG tablet TAKE 1 TABLET (1 MG TOTAL) BY MOUTH DAILY. 10/17/14   Laurie Panda, NP  Cholecalciferol (VITAMIN D) 2000 UNITS CAPS Take by mouth.    Historical Provider, MD  feeding supplement, ENSURE ENLIVE, (ENSURE ENLIVE) LIQD Take 237 mLs by mouth 2 (two) times daily between meals. 07/28/14   Venetia Maxon Rama, MD  ferrous sulfate 325 (65 FE) MG tablet Take 1 tablet (325 mg total) by mouth 3 (three) times daily with meals. 08/25/14   Janece Canterbury, MD  linagliptin (TRADJENTA) 5 MG TABS tablet Take 5 mg by mouth daily.    Historical Provider, MD  Multiple Vitamin (MULTIVITAMIN WITH MINERALS) TABS Take 1 tablet by mouth every other day.     Historical Provider, MD  thiamine 100 MG tablet Take 1 tablet (100 mg total) by mouth daily. 11/04/14   Adrian Prows, MD  VENTOLIN HFA 108 (90 BASE) MCG/ACT inhaler Inhale 2 puffs into the lungs every 4 (four) hours as needed for wheezing or shortness of breath.  10/05/14   Historical Provider, MD  warfarin (COUMADIN) 2 MG tablet Take 2 mg by mouth daily at 6 PM.  10/16/14   Historical Provider, MD   BP 130/53 mmHg  Pulse 78  Temp(Src) 98.7 F (37.1 C) (Oral)  SpO2 95% Physical Exam   Constitutional: She is oriented to person, place, and time. She appears well-developed and well-nourished.  HENT:  Head: Normocephalic and atraumatic.  Mouth/Throat: Oropharynx is clear and moist.  Eyes: Conjunctivae and EOM are normal. Pupils are equal, round, and reactive to light. Right eye exhibits no discharge. Left eye exhibits no discharge. No scleral icterus.  Neck: Normal range of motion. Neck supple.  Cardiovascular: Normal rate, regular rhythm, normal heart sounds and intact distal pulses.   Pulmonary/Chest: Effort normal. No respiratory distress. She has wheezes. She has no rales. She exhibits no tenderness.  Inspiratory and expiratory wheezing noted in bilateral lower lung  fields. Dressing placed over PleuRx present in right lateral chest wall, no drainage, no surrounding erythema.   Abdominal: Soft. Bowel sounds are normal. She exhibits no distension and no mass. There is no tenderness. There is no rebound and no guarding.  Musculoskeletal: Normal range of motion. She exhibits edema. She exhibits no tenderness.  2+ pitting edema of lower extremity, left worse than right.  Lymphadenopathy:    She has no cervical adenopathy.  Neurological: She is alert and oriented to person, place, and time.  Skin: Skin is warm and dry.  Nursing note and vitals reviewed.   ED Course  Procedures (including critical care time)  CRITICAL CARE Performed by: Nona Dell   Total critical care time: 36mn  Critical care time was exclusive of separately billable procedures and treating other patients.  Critical care was necessary to treat or prevent imminent or life-threatening deterioration.  Critical care was time spent personally by me on the following activities: development of treatment plan with patient and/or surrogate as well as nursing, discussions with consultants, evaluation of patient's response to treatment, examination of patient, obtaining history from patient or  surrogate, ordering and performing treatments and interventions, ordering and review of laboratory studies, ordering and review of radiographic studies, pulse oximetry and re-evaluation of patient's condition.   Labs Review Labs Reviewed - No data to display  Imaging Review No results found. I have personally reviewed and evaluated these images and lab results as part of my medical decision-making.   EKG Interpretation None      MDM   Final diagnoses:  None    Pt presents with SOB, wheezing and chest tightness. Pt was recently admitted (8/22) and was d/c home on Sunday. Pt had a right pleural effusion and had a PleuRx catheter placed that she was d/c home with. She had a DVT in right axillary and subclavian during admission and was started on coumadin. Pt has a HEART score of 7 (high risk).   Pt reports her breathing/chest tightness has improved with breathing tx.   EKG unchanged. Troponin 0.57. Consulted cardiology for NSTEMI. Spoke with Dr. GEinar Gipabout elevated troponin, he reports the pt has chronic elevation in troponin, we will wait for the rest of the labs to come back before any further tx. Consulted internal medicine who advised to d/c home if second troponin was less than 0.5 due to history of elevated trop from ESRD, if greater than 0.5 will call cardiology for further evaluation.  Handoff to [employer] PA-C, discussed pt. Troponin pending, hospitalist advised that pt can be d/c home if troponin is below 0.5, if above consult cardiology for further tx. Discussed plan with pt and family.   NNona Dell PA-C 11/07/14 [address]NProsser PVerm[place]08/30/16 1723  DLeo Grosser MD 11/10/14 [id number]

## 2014-11-07 NOTE — ED Notes (Signed)
2 RN unable to obtain IV access, MD aware and to attempt [place].

## 2014-11-07 NOTE — ED Provider Notes (Signed)
Medical screening examination/treatment/procedure(s) were conducted as a shared visit with non-physician practitioner(s) and myself.  I personally evaluated the patient during the encounter.  81 year old female with hypertension, hypercholesterolemia, diabetes and CHF presents with chest tightness and shortness of breath. Appears frail and elderly on exam, planned admission.   EKG Interpretation   Date/Time:  Tuesday November 07 2014 11:28:17 EDT Ventricular Rate:  78 PR Interval:  172 QRS Duration: 81 QT Interval:  418 QTC Calculation: 476 R Axis:   -76 Text Interpretation:  Normal sinus rhythm Probable left atrial enlargement  Left anterior fascicular block probable  Left ventricular hypertrophy  Confirmed by Jasmia Angst MD, Gardiner Espana ([id number]) on 11/07/2014 11:33:42 AM     Procedure note: Ultrasound Guided Peripheral IV Ultrasound guided peripheral 1.88 inch angiocath IV placement performed by me. Indications: Nursing unable to place IV. Details: The antecubital fossa and upper arm were evaluated with a multifrequency linear probe. Patent brachial veins were noted. 1 attempt was made to cannulate a vein under realtime US guidance with successful cannulation of the vein and catheter placement. There is return of non-pulsatile dark red blood. The patient tolerated the procedure well without complications. Images archived electronically.  CPT codes: [phone] and 23953   See related encounter note   Leo Grosser, MD 11/07/14 1257

## 2014-11-08 ENCOUNTER — Encounter (HOSPITAL_COMMUNITY): Payer: Self-pay | Admitting: *Deleted

## 2014-11-08 ENCOUNTER — Other Ambulatory Visit: Payer: Self-pay | Admitting: *Deleted

## 2014-11-08 LAB — PROTIME-INR
INR: 2.68 — AB (ref 0.00–1.49)
PROTHROMBIN TIME: 28.1 s — AB (ref 11.6–15.2)

## 2014-11-08 LAB — BASIC METABOLIC PANEL
ANION GAP: 9 (ref 5–15)
BUN: 50 mg/dL — ABNORMAL HIGH (ref 6–20)
CO2: 24 mmol/L (ref 22–32)
Calcium: 8.5 mg/dL — ABNORMAL LOW (ref 8.9–10.3)
Chloride: 107 mmol/L (ref 101–111)
Creatinine, Ser: 2.27 mg/dL — ABNORMAL HIGH (ref 0.44–1.00)
GFR, EST AFRICAN AMERICAN: 22 mL/min — AB (ref >60)
GFR, EST NON AFRICAN AMERICAN: 19 mL/min — AB (ref >60)
Glucose, Bld: 103 mg/dL — ABNORMAL HIGH (ref 65–99)
POTASSIUM: 4 mmol/L (ref 3.5–5.1)
SODIUM: 140 mmol/L (ref 135–145)

## 2014-11-08 LAB — TROPONIN I: TROPONIN I: 0.92 ng/mL — AB (ref <0.031)

## 2014-11-08 MED ORDER — FUROSEMIDE 40 MG PO TABS: 40.0000 mg | ORAL_TABLET | Freq: Every morning | ORAL | Status: DC

## 2014-11-08 MED ORDER — METOPROLOL TARTRATE 25 MG PO TABS: 12.5000 mg | ORAL_TABLET | Freq: Two times a day (BID) | ORAL | Status: DC

## 2014-11-08 MED ORDER — POTASSIUM CHLORIDE CRYS ER 10 MEQ PO TBCR
10.0000 meq | EXTENDED_RELEASE_TABLET | Freq: Two times a day (BID) | ORAL | Status: DC
  Administered 2014-11-08: 10 meq via ORAL
  Filled 2014-11-08: qty 1

## 2014-11-08 MED ORDER — ENSURE ENLIVE PO LIQD: 237.0000 mL | ORAL | Status: DC

## 2014-11-08 MED ORDER — FUROSEMIDE 40 MG PO TABS
40.0000 mg | ORAL_TABLET | Freq: Every day | ORAL | Status: DC
  Administered 2014-11-08: 40 mg via ORAL
  Filled 2014-11-08: qty 1

## 2014-11-08 NOTE — Discharge Summary (Signed)
Subjective:  Feels much better today. Dyspnea improved. S. Cr stable. States nebulizer helps with dyspnea.  Hospital course: [name] is a 81 year old African-American female with history of hypertension, diabetes mellitus, and history of breast cancer status post bilateral mastectomy in the remote past, retrosternal Goitre was initially referred to me for evaluation of atrial fibrillation detected during normal physical examination on 09/07/2013. She converted to sinus rhythm spontaneously.  She was recently admitted to the hospital on 09/19/2014 with shortness of breath and diagnosed with pleural effusion which has been recurrent over the past year secondary to a very large thyroid goiter. While in the hospital, she had a central venous catheter placed and Xarelto was held in order to perform thoracentesis. She subsequently developed DVTs to the right axillary and subclavian veins. Due to declining renal function from acute illness, Xarelto was no longer appropriate and presently on warfarin. Her INR is being checked at home by home health nurse. She was also started on Amiodarone for A. Fib with RVR. She currently denies any shortness of breath and has a Pleurx drain in place with home health nurse coming out to house 3 times a week to remove collected fluid.  I had admitted her on 10/30/2014 and discharged her on 11/04/2014 to evaluate and see whether her renal function would improve with hydration and also to coordinate the care. She had been doing relatively stable, this afternoon after physical activity developed significant wheezing and hence presented to the emergency room. She has been wheezing recently with heavy physical activity and was also noted during her recent hospital stay. Her renal dysfunction was felt to be due to recurrent pleural effusion and continued pleural drain, anywhere from 600-700 mL daily.  I was called to opine regarding positive serum troponin. Patient denies any  chest pain. She has chronic orthopnea. She has Perurx in place right. She underwent talc pleurodesis on 11/02/2014 by Dr. Ceasar Mons.  Her troponin remained flat, this was not a ACS, felt that it was due to probably acute on chronic diastolic heart failure.  The following morning I did have social work consult and also palliative care team evaluate her.  They felt that she has enough social support outside of the hospital and hence felt stable for discharge and safe for discharge.  During the hospitalization I have had long discussion with the patient's husband regarding for long-term prognosis and made her DNR.  She is not a candidate for coronary angiography as she has chronic orthopnea, stage IV chronic kidney disease and multiple medical issues that complicates any revascularization strategies.  Patient is aware of this.Objective:  Vital Signs in the last 24 hours: Temp:  [98.2 F (36.8 C)-99 F (37.2 C)] 98.2 F (36.8 C) (08/31 1350) Pulse Rate:  [63-96] 63 (08/31 1350) Resp:  [16-24] 20 (08/31 1350) BP: (96-157)/(36-77) 105/49 mmHg (08/31 1350) SpO2:  [92 %-100 %] 100 % (08/31 1350) Weight:  [54.205 kg (119 lb 8 oz)-55.52 kg (122 lb 6.4 oz)] 54.205 kg (119 lb 8 oz) (08/31 0622)  Intake/Output from previous day: 08/30 0701 - 08/31 0700 In: 240 [P.O.:240] Out: 1350 [Urine:1350]  Physical Exam: General appearance: alert, cooperative, appears stated age, no distress and appears wasted Eyes: negative findings: lids and lashes normal Neck: no carotid bruit, no JVD and supple, symmetrical, trachea midline Neck: JVP - normal, carotids 2+= without bruits Resp: decreased breath sounds bilaterally more than halfway at the bases. No wheeze. Breath sounds appear to have improved compared to yesterday.  Chest wall: no tenderness Cardio: S1, S2 normal, no S3 or S4 and systolic murmur: early systolic 5-2/8, crescendo at lower left sternal border, at apex GI: soft, non-tender; bowel sounds normal;  no masses, no organomegaly Extremities: edema 2+, nontender. Full range of motion in extremities   Lab Results: BMP  Recent Labs  11/05/14 1100 11/07/14 1206 11/08/14 0318  NA 139 141 140  K 4.6 3.9 4.0  CL 109 110 107  CO2 '22 22 24  '$ GLUCOSE 117* 101* 103*  BUN 50* 52* 50*  CREATININE 2.59* 2.46* 2.27*  CALCIUM 8.8* 9.0 8.5*  GFRNONAA 17* 18* 19*  GFRAA 19* 20* 22*    CBC  Recent Labs Lab 11/07/14 1206  WBC 6.1  RBC 3.74*  HGB 10.6*  HCT 32.7*  PLT PLATELET CLUMPS NOTED ON SMEAR, COUNT APPEARS ADEQUATE  MCV 87.4  MCH 28.3  MCHC 32.4  RDW 16.4*  LYMPHSABS 0.5*  MONOABS 0.9  EOSABS 0.1  BASOSABS 0.0    HEMOGLOBIN A1C Lab Results  Component Value Date   HGBA1C 6.2* 07/27/2014   MPG 131 07/27/2014    Cardiac Panel (last 3 results)  Recent Labs  09/21/14 0530 11/07/14 1627 11/08/14 0318  TROPONINI 0.29* 0.95* 0.92*    TSH  Recent Labs  07/27/14 0515 08/23/14 0822  TSH 1.036 1.115    CHOLESTEROL No results for input(s): CHOL in the last 8760 hours.  Hepatic Function Panel  Recent Labs  07/26/14 1825  08/24/14 1420  09/29/14 0340 10/30/14 2013 11/07/14 1206  PROT 7.1  < > 6.1*  < > 5.7* 6.5 6.6  ALBUMIN 3.5  < > 3.0*  < > 2.9* 3.3* 2.7*  AST 38  < > 24  < > 16 42* 37  ALT 33  < > 15  < > 12* 31 35  ALKPHOS 85  < > 65  < > 54 85 83  BILITOT 0.9  < > 0.6  < > 0.5 1.0 1.0  BILIDIR 0.2  --  <0.1*  --   --   --   --   IBILI 0.7  --  NOT CALCULATED  --   --   --   --   < > = values in this interval not displayed.  Echocardiogram 09/26/2014: LVEF 60-65% with grade 2 diastolic dysfunction, quadricuspid aortic valve, mild mitral regurgitation, severe left atrial enlargement. Lexiscan sestamibi stress test 09/25/2014: Perfusion: Small fixed defect at the inferior lateral wall on rest and perfusion images. No reversible ischemia identified. Wall Motion: Normal left ventricular wall motion. No left ventricular dilation. Left Ventricular  Ejection Fraction: 62 %  EKG: 11/07/2014 no change from 09/24/2014: Normal sinus rhythm, left axis deviation, left anterior fascicular block. Poor RV progression, LVH with repolarization abnormality.  Assessment/Plan:  1. Acute on chronic diastolic heart failure. 2. Recurrent right pleural effusion being follwoed by Dr. Servando Snare. 3. Chronic stage 4 kidney disease. 4. PAF: CHA2DS2-VASc Score is 5 with yearly risk of stroke of 6.7% per year.  Rec: She is doing well. Will resume Lasix 40 mg daily for OP use and continue Metoprolol 12.5 mg by mouth twice a dayfor now as her blood pressure is soft.Faythe Ghee for discharge from cardiac standpoint.    Medication List    TAKE these medications        acetaminophen 500 MG tablet  Commonly known as:  TYLENOL  Take 500 mg by mouth 2 (two) times daily as needed for moderate pain or headache.  amiodarone 200 MG tablet  Commonly known as:  PACERONE  Take 1 tablet (200 mg total) by mouth daily.     anastrozole 1 MG tablet  Commonly known as:  ARIMIDEX  TAKE 1 TABLET (1 MG TOTAL) BY MOUTH DAILY.     feeding supplement (ENSURE ENLIVE) Liqd  Take 237 mLs by mouth 2 (two) times daily between meals.     ferrous sulfate 325 (65 FE) MG tablet  Take 1 tablet (325 mg total) by mouth 3 (three) times daily with meals.     furosemide 40 MG tablet  Commonly known as:  LASIX  Take 1 tablet (40 mg total) by mouth every morning.     metoprolol tartrate 25 MG tablet  Commonly known as:  LOPRESSOR  Take 0.5 tablets (12.5 mg total) by mouth 2 (two) times daily.     multivitamin with minerals Tabs tablet  Take 1 tablet by mouth every other day.     thiamine 100 MG tablet  Take 1 tablet (100 mg total) by mouth daily.     TRADJENTA 5 MG Tabs tablet  Generic drug:  linagliptin  Take 5 mg by mouth daily.     VENTOLIN HFA 108 (90 BASE) MCG/ACT inhaler  Generic drug:  albuterol  Inhale 2 puffs into the lungs every 4 (four) hours as needed for wheezing  or shortness of breath.     Vitamin D 2000 UNITS Caps  Take by mouth.     warfarin 2 MG tablet  Commonly known as:  COUMADIN  Take 2 mg by mouth daily at 6 PM.        Adrian Prows, M.D. 11/08/2014, 2:59 PM [facility] Pager: [phone] Office: [phone] If no answer: [phone]

## 2014-11-08 NOTE — Progress Notes (Signed)
Initial Nutrition Assessment  DOCUMENTATION CODES:   Non-severe (moderate) malnutrition in context of chronic illness  INTERVENTION: Ensure Enlive po once daily, each supplement provides 350 kcal and 20 grams of protein Provide snacks BID  NUTRITION DIAGNOSIS:   Malnutrition related to chronic illness, poor appetite as evidenced by moderate depletions of muscle mass, moderate depletion of body fat, percent weight loss.   GOAL:   Patient will meet greater than or equal to 90% of their needs   MONITOR:   PO intake, Supplement acceptance, Labs, Weight trends, I & O's  REASON FOR ASSESSMENT:   Malnutrition Screening Tool    ASSESSMENT:   81 year old African-American female with history of hypertension, diabetes mellitus, and history of breast cancer status post bilateral mastectomy in the remote past, retrosternal Goitre. She was recently admitted to the hospital on 09/19/2014 with shortness of breath and diagnosed with pleural effusion which has been recurrent over the past year. She had been doing relatively stable, this afternoon after physical activity developed significant wheezing and hence presented to the emergency room.    Pt states that she used to weigh 152 lbs to 160 lbs one year ago. She states that her appetite varies and she has been losing weight, losing down to 110 lbs recently. Weight history shows pt lost down to 104 lbs. She reports skipping lunch most days. She recently started drinking Ensure and has some at home. She reports eating a bagel, oatmeal, and some fruit this morning. No diet order currently- RN to page physician. Pt has moderate to severe muscle wasting and moderate fat  Wasting per physical exam and per report she has lost >22% of her body weight in the past year- meets nutrition criteria for malnutrition.  RD encouraged snacking in between meals and continued intake of Ensure supplements to prevent further weight loss.  Labs: high troponin, elevated  creatinine and BUN, low GFR, low calcium, low hemoglobin  Diet Order:    RN to paged physician to place diet order  Skin:  Reviewed, no issues  Last BM:  8/30  Height:   Ht Readings from Last 1 Encounters:  11/07/14 '5\' 4"'$  (1.626 m)    Weight:   Wt Readings from Last 1 Encounters:  11/08/14 119 lb 8 oz (54.205 kg)    Ideal Body Weight:  54.5 kg  BMI:  Body mass index is 20.5 kg/(m^2).  Estimated Nutritional Needs:   Kcal:  1400-1600  Protein:  60-70 grams  Fluid:  1.4-1.6 L/day  EDUCATION NEEDS:   No education needs identified at this time  [place], LDN Inpatient Clinical Dietitian Pager: [phone] After Hours Pager: [phone]

## 2014-11-08 NOTE — Progress Notes (Signed)
SATURATION QUALIFICATIONS: (This note is used to comply with regulatory documentation for home oxygen)  Patient Saturations on Room Air at Rest = 98%  Patient Saturations on Room Air while Ambulating = 94%  Patient Saturations on 0 Liters of oxygen while Ambulating = 94%  Please briefly explain why patient needs home oxygen: not needed at this time, but pt has some wheezing and will be followed up with home health RN

## 2014-11-08 NOTE — Consult Note (Signed)
[facility] Inpatient Consult   11/08/2014  ALIZEH MADRIL 11-Nov-1932 [id number] Received a message from [place] of patient's hospitalization.  Patient is currently active with [facility] for chronic disease management services.  Patient has been engaged by a [employer] and CSW.  Our community based plan of care has focused on disease management and community resource support. Met with patient at bedside and she endorses her ongoing consent for [facility] services.  Patient will receive a post discharge transition of care call and will be evaluated for monthly home visits for assessments and disease process education.  Made Inpatient Case Manager aware that [facility] following.  Patient is also currently active with [facility] for nursing care and lab drawing for INR.  Of note, [facility] Care Management services does not replace or interfere with any services that are arranged by inpatient case management or social work.  For additional questions or referrals please contact: Natividad Brood, RN BSN [facility] Liaison  [phone] business mobile phone

## 2014-11-08 NOTE — Patient Outreach (Addendum)
[facility]) Care Management  11/08/2014  Alison Gross 06-17-1932 [id number]  CSW received an In Basket message from [facility] Liaison with [place] [facility], indicating that patient was hospitalized last week, discharging on Sunday, August 28th.  Patient presented to the Emergency Department at [facility] on Tuesday, August 30th, and was in the process of being re-admitted for adjustment of prescription medications and a possible Palliative Care Consult.  CSW will continue to follow to assist with possible discharge planning needs and services.  Nat Christen, BSW, MSW, LCSW  Licensed Education officer, environmental Health System  Mailing [place] [address], [place], [place] [address] Physical Address-[address], [place], [place] [address] Toll Free Main # [phone] Fax # [phone] Cell # [phone]  Fax # [phone]  [name]

## 2014-11-08 NOTE — Progress Notes (Signed)
ANTICOAGULATION CONSULT NOTE - Follow Up Consult  Pharmacy Consult for coumadin Indication: atrial fibrillation  No Known Allergies  Patient Measurements: Height: '5\' 4"'$  (162.6 cm) Weight: 119 lb 8 oz (54.205 kg) (scale a) IBW/kg (Calculated) : 54.7  Vital Signs: Temp: 99 F (37.2 C) (08/31 0622) Temp Source: Oral (08/31 0622) BP: 122/48 mmHg (08/31 0622) Pulse Rate: 96 (08/31 0622)  Labs:  Recent Labs  11/05/14 1100 11/07/14 1206 11/07/14 1305 11/07/14 1627 11/08/14 0318  HGB  --  10.6*  --   --   --   HCT  --  32.7*  --   --   --   PLT  --  PLATELET CLUMPS NOTED ON SMEAR, COUNT APPEARS ADEQUATE  --   --   --   LABPROT  --   --  27.0*  --  28.1*  INR  --   --  2.54*  --  2.68*  CREATININE 2.59* 2.46*  --   --  2.27*  TROPONINI  --   --   --  0.95* 0.92*    Estimated Creatinine Clearance: 17.2 mL/min (by C-G formula based on Cr of 2.27).   Medications:  Scheduled:  . amiodarone  200 mg Oral Daily  . anastrozole  1 mg Oral Daily  . feeding supplement (ENSURE ENLIVE)  237 mL Oral BID BM  . fentaNYL (SUBLIMAZE) injection  50 mcg Intravenous Once  . ferrous sulfate  325 mg Oral TID WC  . linagliptin  5 mg Oral Daily  . metoprolol tartrate  12.5 mg Oral BID  . multivitamin with minerals  1 tablet Oral QODAY  . thiamine  100 mg Oral Daily  . warfarin  2 mg Oral q1800  . Warfarin - Pharmacist Dosing Inpatient   Does not apply q1800    Assessment: 81 yo female here with SOB and here with recurrent pleural effusion. Pharmacy has been consulted to dose coumadin for afib (she is also noted with a recent DVT).  INR today is 2.68 with trend up. Noted plans for possible heparin with possible procedure and CVTS has been consulted.   Home coumadin dose: '2mg'$ /day  Goal of Therapy:  INR 2-3 Monitor platelets by anticoagulation protocol: Yes   Plan:  -Will continue coumadin at home dose -Will follow with procedural plans -Daily PT/INR  Hildred Laser, Pharm D 11/08/2014  8:07 AM

## 2014-11-08 NOTE — Progress Notes (Signed)
[facility]  Patient Status: Active (receiving services up to time of hospitalization)  [facility] is providing the following services: RN and PT  If patient discharges after hours, please call [phone].   Alison Gross 11/08/2014, 10:07 AM

## 2014-11-08 NOTE — Progress Notes (Signed)
Subjective:  Feels much better today.  Objective:  Vital Signs in the last 24 hours: Temp:  [98.4 F (36.9 C)-99 F (37.2 C)] 99 F (37.2 C) (08/31 0622) Pulse Rate:  [72-96] 96 (08/31 0622) Resp:  [16-24] 18 (08/31 0622) BP: (96-157)/(36-77) 122/48 mmHg (08/31 0622) SpO2:  [92 %-100 %] 100 % (08/31 0622) Weight:  [54.205 kg (119 lb 8 oz)-55.52 kg (122 lb 6.4 oz)] 54.205 kg (119 lb 8 oz) (08/31 0622)  Intake/Output from previous day: 08/30 0701 - 08/31 0700 In: 240 [P.O.:240] Out: 1350 [Urine:1350]  Physical Exam: General appearance: alert, cooperative, appears stated age, no distress and appears wasted Eyes: negative findings: lids and lashes normal Neck: no carotid bruit, no JVD and supple, symmetrical, trachea midline Neck: JVP - normal, carotids 2+= without bruits Resp: decreased breath sounds bilaterally more than halfway at the bases. No wheeze. Breath sounds appear to have improved compared to yesterday. Chest wall: no tenderness Cardio: S1, S2 normal, no S3 or S4 and systolic murmur: early systolic 9-8/3, crescendo at lower left sternal border, at apex GI: soft, non-tender; bowel sounds normal; no masses, no organomegaly Extremities: edema 2+, nontender. Full range of motion in extremities   Lab Results: BMP  Recent Labs  11/05/14 1100 11/07/14 1206 11/08/14 0318  NA 139 141 140  K 4.6 3.9 4.0  CL 109 110 107  CO2 '22 22 24  '$ GLUCOSE 117* 101* 103*  BUN 50* 52* 50*  CREATININE 2.59* 2.46* 2.27*  CALCIUM 8.8* 9.0 8.5*  GFRNONAA 17* 18* 19*  GFRAA 19* 20* 22*    CBC  Recent Labs Lab 11/07/14 1206  WBC 6.1  RBC 3.74*  HGB 10.6*  HCT 32.7*  PLT PLATELET CLUMPS NOTED ON SMEAR, COUNT APPEARS ADEQUATE  MCV 87.4  MCH 28.3  MCHC 32.4  RDW 16.4*  LYMPHSABS 0.5*  MONOABS 0.9  EOSABS 0.1  BASOSABS 0.0    HEMOGLOBIN A1C Lab Results  Component Value Date   HGBA1C 6.2* 07/27/2014   MPG 131 07/27/2014    Cardiac Panel (last 3 results)  Recent  Labs  09/21/14 0530 11/07/14 1627 11/08/14 0318  TROPONINI 0.29* 0.95* 0.92*    TSH  Recent Labs  07/27/14 0515 08/23/14 0822  TSH 1.036 1.115    CHOLESTEROL No results for input(s): CHOL in the last 8760 hours.  Hepatic Function Panel  Recent Labs  07/26/14 1825  08/24/14 1420  09/29/14 0340 10/30/14 2013 11/07/14 1206  PROT 7.1  < > 6.1*  < > 5.7* 6.5 6.6  ALBUMIN 3.5  < > 3.0*  < > 2.9* 3.3* 2.7*  AST 38  < > 24  < > 16 42* 37  ALT 33  < > 15  < > 12* 31 35  ALKPHOS 85  < > 65  < > 54 85 83  BILITOT 0.9  < > 0.6  < > 0.5 1.0 1.0  BILIDIR 0.2  --  <0.1*  --   --   --   --   IBILI 0.7  --  NOT CALCULATED  --   --   --   --   < > = values in this interval not displayed.  Echocardiogram 09/26/2014: LVEF 60-65% with grade 2 diastolic dysfunction, quadricuspid aortic valve, mild mitral regurgitation, severe left atrial enlargement. Lexiscan sestamibi stress test 09/25/2014: Perfusion: Small fixed defect at the inferior lateral wall on rest and perfusion images. No reversible ischemia identified. Wall Motion: Normal left ventricular wall motion. No left  ventricular dilation. Left Ventricular Ejection Fraction: 62 %  EKG: 11/07/2014 no change from 09/24/2014: Normal sinus rhythm, left axis deviation, left anterior fascicular block. Poor RV progression, LVH with repolarization abnormality.  Assessment/Plan:  1. Acute on chronic diastolic heart failure. 2. Recurrent right pleural effusion being follwoed by Dr. Servando Snare. 3. Chronic stage 4 kidney disease. 4. PAF: CHA2DS2-VASc Score is 5 with yearly risk of stroke of 6.7% per year.  Rec: She is doing well. Will resume Lasix 40 mg daily for OP use and continue Metoprolol for now. Okay for discharge from cardiac standpoint, but will wait on Case Manager to evaluate Home O2 and also aerosol albuterol equipment and also wait on Palliative team evaluation.   Adrian Prows, M.D. 11/08/2014, 8:14 AM [facility],  [place] Pager: [phone] Office: [phone] If no answer: [phone]

## 2014-11-09 ENCOUNTER — Other Ambulatory Visit: Payer: Self-pay | Admitting: *Deleted

## 2014-11-09 LAB — CULTURE, BLOOD (ROUTINE X 2)
Culture: NO GROWTH
Culture: NO GROWTH

## 2014-11-09 NOTE — Patient Outreach (Signed)
Member re-admitted to hospital 8/22 and discharged 8/28.  Unfortunately, she was re-admitted again on 8/30 and discharged 8/31.  Attempt made to restart transition of care program, no answer.  HIPPA compliant voice message left for husband.  Will await call back and continue with calls next week.  Valente David, BSN, [place] [facility]  [facility] Care Manager [phone]

## 2014-11-10 ENCOUNTER — Other Ambulatory Visit: Payer: Commercial Managed Care - HMO | Admitting: *Deleted

## 2014-11-10 LAB — PROTEIN ELECTROPHORESIS, SERUM
A/G Ratio: 0.9 (ref 0.7–1.7)
Albumin ELP: 2.6 g/dL — ABNORMAL LOW (ref 2.9–4.4)
Alpha-1-Globulin: 0.4 g/dL (ref 0.0–0.4)
Alpha-2-Globulin: 0.8 g/dL (ref 0.4–1.0)
Beta Globulin: 0.7 g/dL (ref 0.7–1.3)
GAMMA GLOBULIN: 1 g/dL (ref 0.4–1.8)
GLOBULIN, TOTAL: 2.9 g/dL (ref 2.2–3.9)
M-SPIKE, %: 0.7 g/dL — AB
TOTAL PROTEIN ELP: 5.5 g/dL — AB (ref 6.0–8.5)

## 2014-11-10 NOTE — Patient Outreach (Signed)
[place] [facility]) Care Management  11/10/2014  STEVIE ERTLE 1932/07/13 [id number]   CSW made one last attempt to try and contact patient today to perform phone assessment, as well as assess and assist with social work needs and services, without success.   CSW will perform a case closure on patient, due to inability to establish initial phone contact with patient, despite required number of attempts made and outreach letter mailed to patient's home.   CSW will notify patient's RNCM with [place] [facility], Valente David of CSW's plans to close patient's case. CSW will fax a correspondence letter to patient's Primary Care Physician, Dr. Glendale Chard to ensure that Dr. Baird Cancer is aware of CSW's involvement with patient. CSW will submit a case closure request to Lurline Del, Care Management Assistant with [place] [facility], in the form of a message.  CSW will ensure that Mrs. Laurance Flatten is aware of Roma Schanz, RNCM with [place] [facility], continued involvement with patient's care.  Nat Christen, BSW, MSW, LCSW  Licensed Education officer, environmental Health System  Mailing [place] [address] [place], [place] [address] Physical Address-[address], [place], [place] [address] Toll Free Main # [phone] Fax # [phone] Cell # [phone]  Fax # [phone]  [name]

## 2014-11-14 ENCOUNTER — Encounter: Payer: Self-pay | Admitting: *Deleted

## 2014-11-14 ENCOUNTER — Other Ambulatory Visit: Payer: Self-pay | Admitting: *Deleted

## 2014-11-14 NOTE — Patient Outreach (Signed)
Weekly transition of care call placed to member.  Member states that she is doing "better."  She reports that she has some additional medications added to her list, she does not know exactly what they are without looking at them and she states that she is outside and unable to get to them right now.  Medications will be reviewed at home visit next week.    Member reports that she still has the Pleurx catheter in place and the home health nurse has continued to visit on Mondays, Wednesdays, Fridays, and Saturdays to drain.  She states that she has an office visit with Dr. Servando Snare on this coming Thursday and he will re-evaluate her condition.    She denies any additional needs at this time.  Encouraged to contact this care manager with any concerns.  Member verbalized understanding.  Valente David, BSN, [place] [facility]  [facility] Care Manager [phone]

## 2014-11-16 ENCOUNTER — Encounter: Payer: Self-pay | Admitting: Cardiothoracic Surgery

## 2014-11-16 ENCOUNTER — Ambulatory Visit (INDEPENDENT_AMBULATORY_CARE_PROVIDER_SITE_OTHER): Payer: Commercial Managed Care - HMO | Admitting: Cardiothoracic Surgery

## 2014-11-16 ENCOUNTER — Ambulatory Visit
Admission: RE | Admit: 2014-11-16 | Discharge: 2014-11-16 | Disposition: A | Discharge status: Home / Self Care | Payer: Commercial Managed Care - HMO | Source: Ambulatory Visit | Attending: Cardiothoracic Surgery | Admitting: Cardiothoracic Surgery

## 2014-11-16 VITALS — BP 102/62 | HR 56 | Resp 20 | Ht 64.0 in | Wt 119.0 lb

## 2014-11-16 DIAGNOSIS — J948 Other specified pleural conditions: Secondary | ICD-10-CM

## 2014-11-16 DIAGNOSIS — J90 Pleural effusion, not elsewhere classified: Secondary | ICD-10-CM

## 2014-11-16 NOTE — Progress Notes (Signed)
[address]       [place] [address]             [phone]                    Shellby D Suess [place] Medical Record #[id number] Date of Birth: January 20, 1933  Referring: Chauncey Cruel, MD Primary Care: Maximino Greenland, MD  Chief Complaint:   SOB s/p talc pleurodesis via right Pleurx catheter  History of Present Illness:    Mrs. Dragos is a 81 yo African American Female with known history of thyroid goiter, right breast cancer (in remission), hyperlipidemia, atrial fibrillation on chronic anticoagulation, HTN, DM, and recurrent pleural effusions. The patient presented to [facility] Emergency Department on 09/19/2014 with complaints of worsening shortness of breath for the past several days. She had undergone several thoracentesis in the past, with most recent being 6/16. Approximately 800 cc of exudative fluid ws removed. CT chest obtained in the ED showed a moderate right sided pleural effusion. The patient was admitted to the medicine service for further workup. The patient underwent IR right thoracentesis which removed 1.2 liters hazy, amber fluid. The fluid was sent for cytology which revealed reactive mesothelial cells without evidence of malignancy. Fluid cultures were negative for bacteria.  The patient also complained of dysphagia while in the hospital and SSLP consult was obtained and showed some dysfunction and her diet was adjusted. Post procedure, the patient developed hypotension with acute shock. She required pressor support. Workup later revealed evidence of adrenal insufficiency. She was treated with steroids and fluid balance was corrected. Troponin was mildly elevated and she was ruled out for acute cardiac event. Once the patient was stabilized, it was felt surgical intervention may be required on her goiter. Cardiac surgery was contacted  surgical resection vs. Pleur-x catheter for palliative relief.  Ultimately, because of her  frail status and protein malnutrition, surgery to remove her goiter was NOT recommended. Aright Pleur X catheter be placed and this was done on 09/27/2014. Home health has been draining the right Pleur X. The patient states it was last drained yesterday and approximately 600 cc was removed. The patient was admitted because of worsening renal function, LE edema, and dyspnea on exertion. During the most recent admission the patient had talc instilled by the Pleurx catheter.   Since discharge the patient has noted some increase in her physical ability, but is still bothered by symptoms of congestive heart failure with PND pedal edema. After the talc pleurodesis she has not seen any decrease in amount of pleural fluid drained Monday Wednesdays Fridays and Saturdays. Chest x-ray in the office today before drainage was done was improved compared to her discharge x-ray.  Her family was asking about home oxygen for her, so far at rest her O2 sats rations have been over 90  Current Activity/ Functional Status:  Patient is independent with mobility/ambulation, transfers, ADL's, IADL's.   Zubrod Score: At the time of surgery this patient's most appropriate activity status/level should be described as: '[]'$     0    Normal activity, no symptoms '[]'$     1    Restricted in physical strenuous activity but ambulatory, able to do out light work '[x]'$     2    Ambulatory and capable of self care, unable to do work activities, up and about               >50 % of waking  hours                              '[]'$     3    Only limited self care, in bed greater than 50% of waking hours '[]'$     4    Completely disabled, no self care, confined to bed or chair '[]'$     5    Moribund   Past Medical History  Diagnosis Date  . Hypercholesterolemia     takes Crestor daily  . Thyroid disease     had iodine radiation  . Hypertension     takes Hyzaar daily  . Dysrhythmia     takes Carvedilol daily  . Pneumonia     history of;last  time about 4-90yr ago  . GERD (gastroesophageal reflux disease)     takes Protonix daily  . Gastric ulcer   . History of blood transfusion   . History of colon polyps   . Anemia     takes iron pill daily  . Diabetes mellitus without complication     takes Tradjenta daily  . History of radiation therapy 07/12/12-08/26/12    right breast/  . Paroxysmal atrial fibrillation     PCP EKG 09/27/2013: A. Fibrillation.. EKG 09/29/2013: S. Tach.  . Breast cancer 04/12/12    right-pos lymph node/left-DCIS  . Shortness of breath on exertion   . Bruit of left carotid artery   . Acute upper GI bleeding 01/24/2012  . MGUS (monoclonal gammopathy of unknown significance) 04/21/2013  . Osteoporosis 11/29/2013  . Recurrent right pleural effusion 07/26/2014  . Stage III chronic kidney disease 07/26/2014  . Goiter 07/27/2014  . Substernal goiter 12/23/2012  . Pleural effusion, right 12/23/2012  . Type 2 diabetes mellitus with renal manifestations 07/26/2014  . Dysphonia 09/20/2014  . Vocal cord paralysis     Suspected due to massive substernal goiter  . CHF (congestive heart failure)     Past Surgical History  Procedure Laterality Date  . Carpal tunnel release Left   . Breast biopsy    . Esophagogastroduodenoscopy  01/24/2012    Procedure: ESOPHAGOGASTRODUODENOSCOPY (EGD);  Surgeon: MJeryl Columbia MD;  [facility];  Service: Endoscopy;  Laterality: N/A;  . Thyroid surgery      "removed goiter"  . Cystectomy      from back of neck  . Knee surgery      right with rods  . Esophagogastroduodenoscopy    . Colonoscopy    . Total mastectomy Bilateral 05/10/2012    Procedure: RIGHT modified mastectomy; LEFT total mastectomy;  Surgeon: CHaywood Lasso MD;  [facility]  Service: General;  Laterality: Bilateral;  . Axillary sentinel node biopsy Right 05/10/2012    Procedure: AXILLARY SENTINEL lymph  NODE BIOPSY;  Surgeon: CHaywood Lasso MD;  Location[facility]  Service: General;  Laterality:  Right;  nuclear medicine injection right side  7:00 am   . Abdominal hysterectomy      fibroids, with bilateral SO  . Chest tube insertion Right 09/27/2014    Procedure: INSERTION OF RIGHT PLEURAL DRAINAGE CATHETER;  Surgeon: EGrace Isaac MD;  Location[facility]  Service: Thoracic;  Laterality: Right;    Family History  Problem Relation Age of Onset  . Brain cancer Mother   . Aneurysm Father   . Diabetes Sister   . Diabetes Brother   . Diabetes Sister   . Diabetes Brother   .  Heart failure Sister     Social History   Social History  . Marital Status: Married    Spouse Name: [name]  . Number of Children: 2  . Years of Education: N/A   Occupational History  . Not on file.   Social History Main Topics  . Smoking status: Former Smoker -- 0.00 packs/day    Types: Cigarettes    Quit date: 07/26/1971  . Smokeless tobacco: Never Used  . Alcohol Use: No  . Drug Use: No  . Sexual Activity: Not Currently    Birth Control/ Protection: Surgical     Comment: menarche age 62,1st live birth 16, P2, no HRT   Other Topics Concern  . Not on file   Social History Narrative   Married.  Ambulates independently.      History  Smoking status  . Former Smoker -- 0.00 packs/day  . Types: Cigarettes  . Quit date: 07/26/1971  Smokeless tobacco  . Never Used    History  Alcohol Use No     No Known Allergies  Current Outpatient Prescriptions  Medication Sig Dispense Refill  . acetaminophen (TYLENOL) 500 MG tablet Take 500 mg by mouth 2 (two) times daily as needed for moderate pain or headache.     Marland Kitchen amiodarone (PACERONE) 200 MG tablet Take 1 tablet (200 mg total) by mouth daily. 60 tablet 0  . anastrozole (ARIMIDEX) 1 MG tablet TAKE 1 TABLET (1 MG TOTAL) BY MOUTH DAILY. 30 tablet 5  . Cholecalciferol (VITAMIN D) 2000 UNITS CAPS Take by mouth.    . feeding supplement, ENSURE ENLIVE, (ENSURE ENLIVE) LIQD Take 237 mLs by mouth 2 (two) times daily between meals. 237 mL 12  .  ferrous sulfate 325 (65 FE) MG tablet Take 1 tablet (325 mg total) by mouth 3 (three) times daily with meals. 90 tablet 0  . furosemide (LASIX) 40 MG tablet Take 1 tablet (40 mg total) by mouth every morning. 30 tablet 1  . linagliptin (TRADJENTA) 5 MG TABS tablet Take 5 mg by mouth daily.    . metoprolol tartrate (LOPRESSOR) 25 MG tablet Take 0.5 tablets (12.5 mg total) by mouth 2 (two) times daily. 60 tablet 1  . Multiple Vitamin (MULTIVITAMIN WITH MINERALS) TABS Take 1 tablet by mouth every other day.     . thiamine 100 MG tablet Take 1 tablet (100 mg total) by mouth daily. 90 tablet 0  . VENTOLIN HFA 108 (90 BASE) MCG/ACT inhaler Inhale 2 puffs into the lungs every 4 (four) hours as needed for wheezing or shortness of breath.   0  . warfarin (COUMADIN) 2 MG tablet Take 2 mg by mouth daily at 6 PM.   0   No current facility-administered medications for this visit.      Review of Systems:     Cardiac Review of Systems: Y or N  Chest Pain [    ]  Resting SOB [ y  ] Exertional SOB  Blue.Reese  ]  Orthopnea Blue.Reese  ]   Pedal Edema [  y ]    Palpitations [ y ] Syncope  [n  ]   Presyncope [ y  ]  General Review of Systems: [Y] = yes [  ]=no Constitional: recent weight change [  ];  Wt loss over the last 3 months [   ] anorexia [  ]; fatigue [  ]; nausea [  ]; night sweats [  ]; fever [  ]; or chills [  ];  Dental: poor dentition[  ]; Last Dentist visit:   Eye : blurred vision [  ]; diplopia [   ]; vision changes [  ];  Amaurosis fugax[  ]; Resp: cough [  ];  wheezing[  ];  hemoptysis[  ]; shortness of breath[  ]; paroxysmal nocturnal dyspnea[  ]; dyspnea on exertion[  ]; or orthopnea[  ];  GI:  gallstones[  ], vomiting[  ];  dysphagia[  ]; melena[  ];  hematochezia [  ]; heartburn[  ];   Hx of  Colonoscopy[  ]; GU: kidney stones [  ]; hematuria[  ];   dysuria [  ];  nocturia[  ];  history of     obstruction [  ]; urinary frequency [  ]             Skin: rash, swelling[  ];, hair loss[  ];   peripheral edema[  ];  or itching[  ]; Musculosketetal: myalgias[  ];  joint swelling[  ];  joint erythema[  ];  joint pain[  ];  back pain[  ];  Heme/Lymph: bruising[  ];  bleeding[  ];  anemia[  ];  Neuro: TIA[  ];  headaches[  ];  stroke[  ];  vertigo[  ];  seizures[  ];   paresthesias[  ];  difficulty walking[  ];  Psych:depression[  ]; anxiety[  ];  Endocrine: diabetes[  ];  thyroid dysfunction[  ];  Immunizations: Flu up to date [  ]; Pneumococcal up to date [  ];  Other:  Physical Exam: BP 102/62 mmHg  Pulse 56  Resp 20  Ht '5\' 4"'$  (1.626 m)  Wt 119 lb (53.978 kg)  BMI 20.42 kg/m2  SpO2   PHYSICAL EXAMINATION: General appearance: alert, cooperative and appears older than stated age Head: Normocephalic, without obvious abnormality, atraumatic Neck: no adenopathy, no carotid bruit, no JVD, supple, symmetrical, trachea midline and thyroid not enlarged, symmetric, no tenderness/mass/nodules Lymph nodes: Cervical, supraclavicular, and axillary nodes normal. Resp: diminished breath sounds bibasilar Back: symmetric, no curvature. ROM normal. No CVA tenderness. Cardio: irregularly irregular rhythm GI: soft, non-tender; bowel sounds normal; no masses,  no organomegaly Extremities: edema 3+ edema bilaterally Neurologic: Grossly normal  Diagnostic Studies & Laboratory data:     Recent Radiology Findings:  Dg Chest 2 View  11/16/2014   CLINICAL DATA:  Recurrent right-sided pleural effusion, shortness of breath, history of breast malignancy and long history of tobacco use.  EXAM: CHEST  2 VIEW  COMPARISON:  PA and lateral chest x-ray of November 07, 2014  FINDINGS: There is a small right pleural effusion which is less prominent than on the previous study. The small caliber chest tube is unchanged in position with the tip projecting over the medial aspect of the eighth rib posteriorly. On the left there is a smaller pleural effusion which has increased in size since the previous study. A  persistent large right paratracheal mass is observed. The cardiac silhouette is mildly enlarged. The pulmonary vascularity is not engorged. There are surgical clips in the right axillary region.  IMPRESSION: 1. Slight interval decrease in the size of the right pleural effusion with a small caliber chest tube is unchanged in appearance. Slight interval increase in the volume of the small left pleural effusion. 2. Stable large right paratracheal mass.   Electronically Signed   By: David  Martinique M.D.   On: 11/16/2014 10:29    I have independently reviewed the above radiologic studies.  Recent Lab  Findings: Lab Results  Component Value Date   WBC 6.1 11/07/2014   HGB 10.6* 11/07/2014   HCT 32.7* 11/07/2014   PLT  11/07/2014    PLATELET CLUMPS NOTED ON SMEAR, COUNT APPEARS ADEQUATE   GLUCOSE 103* 11/08/2014   ALT 35 11/07/2014   AST 37 11/07/2014   NA 140 11/08/2014   K 4.0 11/08/2014   CL 107 11/08/2014   CREATININE 2.27* 11/08/2014   BUN 50* 11/08/2014   CO2 24 11/08/2014   TSH 1.115 08/23/2014   INR 2.68* 11/08/2014   HGBA1C 6.2* 07/27/2014      Assessment / Plan:   #1 continued bilateral pleural effusions right greater than left in spite talc pleurodesis via the right Pleurx catheter- the catheter was drained today and 500 mL came out. Her chest x-ray looks improved compared to her discharge from. We will increase the drainage interval to daily for 2 weeks and then return to Monday Wednesday Friday and Saturday drainage. I discussed with the patient repeat talc pleurodesis versus more invasive procedures.  #2 patient's family questions that she needed oxygen, resting O2 sats reported by the home nurse are always greater than 90. I suggested discussing with Dr. Einar Gip  a walk test and if her oxygen sats drop with exertion consider oxygen therapy.  Plan to see the patient back in 4 weeks      [name] MD      [address] [place] [address] Office  [phone]   Beeper [phone]  11/16/2014 11:10 AM

## 2014-11-21 ENCOUNTER — Other Ambulatory Visit: Payer: Self-pay | Admitting: *Deleted

## 2014-11-21 NOTE — Patient Outreach (Signed)
[facility]) Care Management   11/21/2014  Alison Gross 1932/08/25 [id number]  Alison Gross is an 81 y.o. female  Subjective:   Member reports that she is doing "fair."  She states that since her follow up with Dr. Servando Snare last week, the home health nurse is coming daily to drain her pleurX catheter, hoping that it would improve the member's symptoms and overall status.  She reports that she is still short of breath when walking, and the daily draining has not changed her symptoms.  She states that instead of the home health nurse getting 800 ml every other day, that the nurse is getting about 400 ml every day, not having a decrease in the overall drainage amount.  She also reports that she continues to have leg swelling daily when she is out of bed and does not have her legs elevated.    Objective:   Review of Systems  Constitutional: Negative.   HENT: Negative.   Eyes: Negative.   Respiratory: Positive for shortness of breath.   Cardiovascular: Positive for leg swelling.  Gastrointestinal: Negative.   Genitourinary: Negative.   Musculoskeletal: Negative.   Skin: Negative.   Neurological: Negative.   Endo/Heme/Allergies: Negative.   Psychiatric/Behavioral: Negative.     Physical Exam  Constitutional: She is oriented to person, place, and time. She appears well-developed.  Neck: Normal range of motion.  Cardiovascular: Regular rhythm and normal heart sounds.   Bradycardia   Respiratory: She has wheezes.  GI: Soft. Bowel sounds are normal.  Musculoskeletal: Normal range of motion.  Neurological: She is alert and oriented to person, place, and time.  Skin: Skin is warm and dry.   BP 92/54 mmHg  Pulse 54  Resp 20  Wt 118 lb (53.524 kg)  SpO2 94%  Current Medications:   Current Outpatient Prescriptions  Medication Sig Dispense Refill  . acetaminophen (TYLENOL) 500 MG tablet Take 500 mg by mouth 2 (two) times daily as needed for moderate pain or  headache.     Marland Kitchen amiodarone (PACERONE) 200 MG tablet Take 1 tablet (200 mg total) by mouth daily. 60 tablet 0  . anastrozole (ARIMIDEX) 1 MG tablet TAKE 1 TABLET (1 MG TOTAL) BY MOUTH DAILY. 30 tablet 5  . Cholecalciferol (VITAMIN D) 2000 UNITS CAPS Take by mouth.    . feeding supplement, ENSURE ENLIVE, (ENSURE ENLIVE) LIQD Take 237 mLs by mouth 2 (two) times daily between meals. 237 mL 12  . ferrous sulfate 325 (65 FE) MG tablet Take 1 tablet (325 mg total) by mouth 3 (three) times daily with meals. 90 tablet 0  . furosemide (LASIX) 40 MG tablet Take 1 tablet (40 mg total) by mouth every morning. 30 tablet 1  . linagliptin (TRADJENTA) 5 MG TABS tablet Take 5 mg by mouth daily.    . metoprolol tartrate (LOPRESSOR) 25 MG tablet Take 0.5 tablets (12.5 mg total) by mouth 2 (two) times daily. 60 tablet 1  . Multiple Vitamin (MULTIVITAMIN WITH MINERALS) TABS Take 1 tablet by mouth every other day.     . thiamine 100 MG tablet Take 1 tablet (100 mg total) by mouth daily. 90 tablet 0  . VENTOLIN HFA 108 (90 BASE) MCG/ACT inhaler Inhale 2 puffs into the lungs every 4 (four) hours as needed for wheezing or shortness of breath.   0  . warfarin (COUMADIN) 2 MG tablet Take 2 mg by mouth daily at 6 PM.   0   No current facility-administered medications for  this visit.    Functional Status:   In your present state of health, do you have any difficulty performing the following activities: 11/07/2014 10/30/2014  Hearing? N N  Vision? N N  Difficulty concentrating or making decisions? N N  Walking or climbing stairs? Y Y  Dressing or bathing? N N  Doing errands, shopping? Y N  Preparing Food and eating ? - -  Using the Toilet? - -  In the past six months, have you accidently leaked urine? - -  Do you have problems with loss of bowel control? - -  Managing your Medications? - -  Managing your Finances? - -  Housekeeping or managing your Housekeeping? - -    Fall/Depression Screening:    PHQ 2/9 Scores  10/18/2014  PHQ - 2 Score 0    Assessment:    During visit, Amy from [facility] present to drain PlureX catheter.  Vital signs taken prior, member's blood pressure and heart rate slightly decreased.  Member denies taking medication this morning.  Dr. Irven Shelling office called to reports readings, spoke with Bridgette.  This care manager requested parameters, member advised to skip this morning's dose of metoprolol, but to take the evening dose.  Instructions received to call office if the systolic blood pressure is less than 90 and the heart rate less than 55.  Member, daughter, and Amy all made aware of instructions, and they were written on member's vital signs log.    Member states that she was told that her goiter continues to grow and that surgery at this time is not an option.  She has another appointment with Dr. Servando Snare to assess the status of her fluid after two weeks of draining daily.  Member denies any pain, only shortness of breath with activity.  She is instructed to have legs elevated at all times when sitting and to wear her compression socks to manage her leg swelling.    Daughter present during visit, inquired about the list of resources for personal care assistance.  She was informed that according to the notes by Education officer, museum, J. Saporito, that a list of references was mailed out.  Member and daughter deny receiving package in the mail and request another to be sent.  They are also interested in helping the member apply for Medicaid.  Member and daughter denies any further concerns at this time, encouraged to contact with any questions.  Plan:   Will notify social worker of daughter's interest in applying for Medicaid and request for another information package to be sent. Routine home visit scheduled for next month.  THN CM Care Plan Problem One        Most Recent Value   Care Plan Problem One  Recent hospitalization   Role Documenting the Problem One  Care Management  Coordinator   Care Plan for Problem One  Active   THN Long Term Goal (31-90 days)  Member will not be readmitted to hospital within the next 31 days   THN Long Term Goal Start Date  11/09/14 [facility] readmitted, program restarted]   Interventions for Problem One Long Term Goal  Discussed with member the importance of following discharge instructions, including follow up appointments, medications, diet, and home health involvement, to decrease the risk of readmission   THN CM Short Term Goal #1 (0-30 days)  Member will take medications as prescribed over the next 4 weeks   THN CM Short Term Goal #1 Start Date  11/09/14 [facility] readmitted,  program restarted]   Interventions for Short Term Goal #1  Discussed with member the importance of following discharge instructions, including follow up appointments, medications, diet, and home health involvement, to decrease the risk of readmission   THN CM Short Term Goal #2 (0-30 days)  Member will have follow up appointment with cardiovascular surgeon within the next 4 weeks   THN CM Short Term Goal #2 Start Date  11/09/14   Interventions for Short Term Goal #2  Discussed with member the importance of following discharge instructions, including follow up appointments, medications, diet, and home health involvement, to decrease the risk of readmission     Valente David, BSN, [place] Manager [phone]

## 2014-12-01 ENCOUNTER — Other Ambulatory Visit: Payer: Self-pay | Admitting: *Deleted

## 2014-12-01 NOTE — Patient Outreach (Signed)
Weekly transition of care call placed to member.  She states that she is doing "pretty good."  She denies any shortness of breath at this time.  She reports that she is still getting her drain removed daily, but her family will soon learn how to drain the catheter.  She states that she is still having the same amount of fluid removed daily (between 343m and 4584m.  She says that this will be discussed at her next appointment with Dr. GeServando Snare Member states that her blood pressure remains 9002Tystolic, and that the home health nurse is monitoring and will discuss adjusting her medications with the physician.    She denies any concerns at this time.  Encouraged to contact this care manager with any questions.  Will continue transition of care calls next week.  MoValente DavidBSN, PCCherokeeanagement  [facility] Manager [phone]

## 2014-12-03 DIAGNOSIS — J90 Pleural effusion, not elsewhere classified: Secondary | ICD-10-CM | POA: Diagnosis not present

## 2014-12-08 ENCOUNTER — Other Ambulatory Visit: Payer: Self-pay | Admitting: Cardiothoracic Surgery

## 2014-12-08 ENCOUNTER — Other Ambulatory Visit: Payer: Self-pay | Admitting: *Deleted

## 2014-12-08 DIAGNOSIS — J90 Pleural effusion, not elsewhere classified: Secondary | ICD-10-CM

## 2014-12-08 NOTE — Patient Outreach (Signed)
Weekly transition of care call placed to member.  She reports that she is doing "better" and denies difficulty breathing at this time.  She states that her daughter has learned how to drain her PleurX catheter because her insurance is no longer allowing the home health agency to make daily visit to drain.  Member reports that since her daughter is now draining her catheter, the visits from the home health agency will begin to taper down.  She expresses no concerns at this time, stating that she has an appointment with Dr. Servando Snare next week for a follow up.  Encouraged to contact this care manager with any questions.    Valente David, BSN, [place] Management  [facility] Care Manager [phone]

## 2014-12-11 ENCOUNTER — Ambulatory Visit
Admission: RE | Admit: 2014-12-11 | Discharge: 2014-12-11 | Disposition: A | Discharge status: Home / Self Care | Payer: Commercial Managed Care - HMO | Source: Ambulatory Visit | Attending: Cardiothoracic Surgery | Admitting: Cardiothoracic Surgery

## 2014-12-11 ENCOUNTER — Ambulatory Visit (INDEPENDENT_AMBULATORY_CARE_PROVIDER_SITE_OTHER): Payer: Commercial Managed Care - HMO | Admitting: Surgical

## 2014-12-11 VITALS — BP 100/48 | HR 62 | Resp 16 | Ht 64.0 in | Wt 128.0 lb

## 2014-12-11 DIAGNOSIS — J948 Other specified pleural conditions: Secondary | ICD-10-CM

## 2014-12-11 DIAGNOSIS — J90 Pleural effusion, not elsewhere classified: Secondary | ICD-10-CM

## 2014-12-11 NOTE — Patient Instructions (Signed)
Continue daily pleurx drainage

## 2014-12-11 NOTE — Progress Notes (Signed)
[address]       [place] [address]             [phone]                  Brittie D Quinby [place] Medical Record #[id number] Date of Birth: September 22, 1932  Referring AY:[id number], Virgie Dad, MD Primary Cardiology: Primary Marcella Dubs, MD  Chief Complaint:  Follow Up Visit DATE OF PROCEDURE: 09/27/2014 DATE OF DISCHARGE:   OPERATIVE REPORT   PREOPERATIVE DIAGNOSIS: Recurrent right pleural effusion.  POSTOPERATIVE DIAGNOSIS: Recurrent right pleural effusion.  SURGICAL PROCEDURE: Placement of right PleurX catheter with fluoroscopy and ultrasound guidance.  SURGEON: Lanelle Bal, MD  History of Present Illness:      Patient is a 81 year old black female who is status post right Pleurx catheter placement for chronic effusion related to diastolic congestive heart failure. She continues to have a large amount of drainage. Recently she has undergone 2 daily drainage and has obtained between 450 cc and 600 cc. She did undergo talc pleurodesis on one occasion. Shortness of breath is described as intermittent and relatively mild.        Zubrod Score: At the time of surgery this patient's most appropriate activity status/level should be described as: '[]'$     0    Normal activity, no symptoms '[]'$     1    Restricted in physical strenuous activity but ambulatory, able to do out light work '[]'$     2    Ambulatory and capable of self care, unable to do work activities, up and about                 >50 % of waking hours                                                                                   '[]'$     3    Only limited self care, in bed greater than 50% of waking hours '[]'$     4    Completely disabled, no self care, confined to bed or chair '[]'$     5    Moribund  History  Smoking status  . Former Smoker -- 0.00 packs/day  . Types: Cigarettes  . Quit date: 07/26/1971  Smokeless tobacco  . Never Used        No Known Allergies  Current Outpatient Prescriptions  Medication Sig Dispense Refill  . acetaminophen (TYLENOL) 500 MG tablet Take 500 mg by mouth 2 (two) times daily as needed for moderate pain or headache.     Marland Kitchen amiodarone (PACERONE) 200 MG tablet Take 1 tablet (200 mg total) by mouth daily. 60 tablet 0  . anastrozole (ARIMIDEX) 1 MG tablet TAKE 1 TABLET (1 MG TOTAL) BY MOUTH DAILY. 30 tablet 5  . Cholecalciferol (VITAMIN D) 2000 UNITS CAPS Take by mouth.    . feeding supplement, ENSURE ENLIVE, (ENSURE ENLIVE) LIQD Take 237 mLs by mouth 2 (two) times daily between meals. 237 mL 12  . ferrous sulfate 325 (65 FE) MG tablet Take 1 tablet (325 mg total) by mouth 3 (three) times daily with meals. 90 tablet 0  .  furosemide (LASIX) 40 MG tablet Take 1 tablet (40 mg total) by mouth every morning. 30 tablet 1  . linagliptin (TRADJENTA) 5 MG TABS tablet Take 5 mg by mouth daily.    . metoprolol tartrate (LOPRESSOR) 25 MG tablet Take 0.5 tablets (12.5 mg total) by mouth 2 (two) times daily. 60 tablet 1  . Multiple Vitamin (MULTIVITAMIN WITH MINERALS) TABS Take 1 tablet by mouth every other day.     . thiamine 100 MG tablet Take 1 tablet (100 mg total) by mouth daily. 90 tablet 0  . VENTOLIN HFA 108 (90 BASE) MCG/ACT inhaler Inhale 2 puffs into the lungs every 4 (four) hours as needed for wheezing or shortness of breath.   0  . warfarin (COUMADIN) 2 MG tablet Take 2 mg by mouth daily at 6 PM. OR AS DIRECTED  0   No current facility-administered medications for this visit.       Physical Exam: BP 100/48 mmHg  Pulse 62  Resp 16  Ht '5\' 4"'$  (1.626 m)  Wt 128 lb (58.06 kg)  BMI 21.96 kg/m2  SpO2 98%  General appearance: alert, cooperative, cachectic and no distress Heart: regular rate and rhythm Lungs: Fair air exchange throughout, diminished in right base   Diagnostic Studies & Laboratory data:         Recent Radiology Findings: Dg Chest 2 View  12/11/2014   CLINICAL DATA:   Followup pleural effusions. Breast carcinoma. Goiter.  EXAM: CHEST  2 VIEW  COMPARISON:  11/16/2014 and previous CT on 09/19/2014  FINDINGS: Right chest tube remains in place. Small right pleural effusion shows mild decrease in size since previous study. No pneumothorax visualized. Small left pleural effusion remains stable. Heart size remains stable. Large right mediastinal mass is unchanged and consistent with substernal goiter shown on recent CT.  IMPRESSION: Mild decrease in right pleural effusion. No pneumothorax visualized.  Stable small left pleural effusion.  Stable mediastinal mass, consistent with substernal goiter showed on recent CT.   Electronically Signed   By: Earle Gell M.D.   On: 12/11/2014 14:38      I have independently reviewed the above radiology findings and reviewed findings  with the patient.  Recent Labs: Lab Results  Component Value Date   WBC 6.1 11/07/2014   HGB 10.6* 11/07/2014   HCT 32.7* 11/07/2014   PLT  11/07/2014    PLATELET CLUMPS NOTED ON SMEAR, COUNT APPEARS ADEQUATE   GLUCOSE 103* 11/08/2014   ALT 35 11/07/2014   AST 37 11/07/2014   NA 140 11/08/2014   K 4.0 11/08/2014   CL 107 11/08/2014   CREATININE 2.27* 11/08/2014   BUN 50* 11/08/2014   CO2 24 11/08/2014   TSH 1.115 08/23/2014   INR 2.68* 11/08/2014   HGBA1C 6.2* 07/27/2014      Assessment / Plan:  the patient remains stable but currently requires daily drainage of the Pleurx catheter. We will continue that. Consideration will be undertaken for possible repeat talc infusion. We will see in the office in 2 weeks.         Kazia Grisanti E 12/11/2014 3:12 PM

## 2014-12-14 ENCOUNTER — Ambulatory Visit: Payer: Commercial Managed Care - HMO | Admitting: Cardiothoracic Surgery

## 2014-12-15 ENCOUNTER — Other Ambulatory Visit: Payer: Self-pay | Admitting: *Deleted

## 2014-12-15 NOTE — Patient Outreach (Signed)
Final transition of care call placed to member.  Member states that she is "doing all right."  She reports that she has some wheezing this morning, but has not had her PleurX catheter drained today yet.  She states that her daughter will drain the catheter shortly.  She reports that she saw Dr. Everrett Coombe assistant this week, but there are no definite plans as of yet.  She states that there is a possibility of another talc procedure, but the decision will be made at the next follow up in a couple of weeks.    She states that her blood pressure remains low, but she continues to follow instructions from her physician regarding medication administration.  Member confirms home visit scheduled for next week.  Encouraged to contact this care manager with any concerns.  Valente David, BSN, [place] Management  [facility] Care Manager [phone]

## 2014-12-19 ENCOUNTER — Ambulatory Visit: Payer: Commercial Managed Care - HMO | Admitting: *Deleted

## 2014-12-27 ENCOUNTER — Other Ambulatory Visit: Payer: Self-pay | Admitting: Cardiothoracic Surgery

## 2014-12-27 DIAGNOSIS — J90 Pleural effusion, not elsewhere classified: Secondary | ICD-10-CM

## 2014-12-28 ENCOUNTER — Ambulatory Visit (INDEPENDENT_AMBULATORY_CARE_PROVIDER_SITE_OTHER): Payer: Commercial Managed Care - HMO | Admitting: Cardiothoracic Surgery

## 2014-12-28 ENCOUNTER — Encounter: Payer: Self-pay | Admitting: Cardiothoracic Surgery

## 2014-12-28 ENCOUNTER — Ambulatory Visit
Admission: RE | Admit: 2014-12-28 | Discharge: 2014-12-28 | Disposition: A | Discharge status: Home / Self Care | Payer: Commercial Managed Care - HMO | Source: Ambulatory Visit | Attending: Cardiothoracic Surgery | Admitting: Cardiothoracic Surgery

## 2014-12-28 VITALS — BP 97/57 | HR 50 | Resp 20 | Ht 64.0 in | Wt 133.0 lb

## 2014-12-28 DIAGNOSIS — J90 Pleural effusion, not elsewhere classified: Secondary | ICD-10-CM

## 2014-12-28 DIAGNOSIS — J948 Other specified pleural conditions: Secondary | ICD-10-CM

## 2014-12-28 NOTE — Progress Notes (Signed)
[address]       [place] [address]             [phone]                    Sussan D Dabbs [place] Medical Record #[id number] Date of Birth: 29-Jun-1932  Referring: Chauncey Cruel, MD Primary Care: Maximino Greenland, MD  Chief Complaint:   SOB s/p talc pleurodesis via right Pleurx catheter  History of Present Illness:    Alison Gross is a 81 yo African American Female with known history of thyroid goiter, right breast cancer (in remission), hyperlipidemia, atrial fibrillation on chronic anticoagulation, HTN, DM, and recurrent pleural effusions. The patient presented to [facility] Emergency Department on 09/19/2014 with complaints of worsening shortness of breath for the past several days. She had undergone several thoracentesis in the past, with most recent being 6/16. Approximately 800 cc of exudative fluid ws removed. CT chest obtained in the ED showed a moderate right sided pleural effusion. The patient was admitted to the medicine service for further workup. The patient underwent IR right thoracentesis which removed 1.2 liters hazy, amber fluid. The fluid was sent for cytology which revealed reactive mesothelial cells without evidence of malignancy. Fluid cultures were negative for bacteria.  The patient also complained of dysphagia while in the hospital and SSLP consult was obtained and showed some dysfunction and her diet was adjusted. Post procedure, the patient developed hypotension with acute shock. She required pressor support. Workup later revealed evidence of adrenal insufficiency. She was treated with steroids and fluid balance was corrected. Troponin was mildly elevated and she was ruled out for acute cardiac event. Once the patient was stabilized, it was felt surgical intervention may be required on her goiter. Cardiac surgery was contacted  surgical resection vs. Pleur-x catheter for palliative relief.  Ultimately, because of her  frail status and protein malnutrition, surgery to remove her goiter was NOT recommended. Aright Pleur X catheter be placed and this was done on 09/27/2014. Home health has been draining the right Pleur X. The patient states it was last drained yesterday and approximately 600 cc was removed. The patient was admitted because of worsening renal function, LE edema, and dyspnea on exertion. During the most recent admission the patient had talc instilled by the Pleurx catheter.   Since discharge the patient has noted some increase in her physical ability, but is still bothered by symptoms of congestive heart failure with PND pedal edema. After the talc pleurodesis she has not seen any decrease in amount of pleural fluid drained Monday Wednesdays Fridays and Saturdays, remains about 500 ml day  She feels better on home o2  She notes increasing swelling legs and thighs.    Current Activity/ Functional Status:  Patient is independent with mobility/ambulation, transfers, ADL's, IADL's.   Zubrod Score: At the time of surgery this patient's most appropriate activity status/level should be described as: '[]'$     0    Normal activity, no symptoms '[]'$     1    Restricted in physical strenuous activity but ambulatory, able to do out light work '[x]'$     2    Ambulatory and capable of self care, unable to do work activities, up and about               >50 % of waking hours                              '[]'$   3    Only limited self care, in bed greater than 50% of waking hours '[]'$     4    Completely disabled, no self care, confined to bed or chair '[]'$     5    Moribund   Past Medical History  Diagnosis Date  . Hypercholesterolemia     takes Crestor daily  . Thyroid disease     had iodine radiation  . Hypertension     takes Hyzaar daily  . Dysrhythmia     takes Carvedilol daily  . Pneumonia     history of;last time about 4-72yr ago  . GERD (gastroesophageal reflux disease)     takes Protonix daily  .  Gastric ulcer   . History of blood transfusion   . History of colon polyps   . Anemia     takes iron pill daily  . Diabetes mellitus without complication ([facility]     takes Tradjenta daily  . History of radiation therapy 07/12/12-08/26/12    right breast/  . Paroxysmal atrial fibrillation ([facility]     PCP EKG 09/27/2013: A. Fibrillation.. EKG 09/29/2013: S. Tach.  . Breast cancer ([facility] 04/12/12    right-pos lymph node/left-DCIS  . Shortness of breath on exertion   . Bruit of left carotid artery   . Acute upper GI bleeding 01/24/2012  . MGUS (monoclonal gammopathy of unknown significance) 04/21/2013  . Osteoporosis 11/29/2013  . Recurrent right pleural effusion 07/26/2014  . Stage III chronic kidney disease 07/26/2014  . Goiter 07/27/2014  . Substernal goiter 12/23/2012  . Pleural effusion, right 12/23/2012  . Type 2 diabetes mellitus with renal manifestations ([facility] 07/26/2014  . Dysphonia 09/20/2014  . Vocal cord paralysis     Suspected due to massive substernal goiter  . CHF (congestive heart failure) ([facility]     Past Surgical History  Procedure Laterality Date  . Carpal tunnel release Left   . Breast biopsy    . Esophagogastroduodenoscopy  01/24/2012    Procedure: ESOPHAGOGASTRODUODENOSCOPY (EGD);  Surgeon: MJeryl Columbia MD;  [facility];  Service: Endoscopy;  Laterality: N/A;  . Thyroid surgery      "removed goiter"  . Cystectomy      from back of neck  . Knee surgery      right with rods  . Esophagogastroduodenoscopy    . Colonoscopy    . Total mastectomy Bilateral 05/10/2012    Procedure: RIGHT modified mastectomy; LEFT total mastectomy;  Surgeon: CHaywood Lasso MD;  [facility] [facility]  Service: General;  Laterality: Bilateral;  . Axillary sentinel node biopsy Right 05/10/2012    Procedure: AXILLARY SENTINEL lymph  NODE BIOPSY;  Surgeon: CHaywood Lasso MD;  [place] [facility]  Service: General;  Laterality: Right;  nuclear medicine injection right side  7:00 am   .  Abdominal hysterectomy      fibroids, with bilateral SO  . Chest tube insertion Right 09/27/2014    Procedure: INSERTION OF RIGHT PLEURAL DRAINAGE CATHETER;  Surgeon: EGrace Isaac MD;  [place] [facility]  Service: Thoracic;  Laterality: Right;    Family History  Problem Relation Age of Onset  . Brain cancer Mother   . Aneurysm Father   . Diabetes Sister   . Diabetes Brother   . Diabetes Sister   . Diabetes Brother   . Heart failure Sister     Social History   Social History  . Marital Status: Married    Spouse Name: JJeneen Rinks . Number of  Children: 2  . Years of Education: N/A   Social History Main Topics  . Smoking status: Former Smoker -- 0.00 packs/day    Types: Cigarettes    Quit date: 07/26/1971  . Smokeless tobacco: Never Used  . Alcohol Use: No  . Drug Use: No  . Sexual Activity: Not Currently    Birth Control/ Protection: Surgical     Comment: menarche age 89,1st live birth 65, P2, no HRT   Social History Narrative   Married.  Ambulates independently.      History  Smoking status  . Former Smoker -- 0.00 packs/day  . Types: Cigarettes  . Quit date: 07/26/1971  Smokeless tobacco  . Never Used    History  Alcohol Use No     No Known Allergies  Current Outpatient Prescriptions  Medication Sig Dispense Refill  . acetaminophen (TYLENOL) 500 MG tablet Take 500 mg by mouth 2 (two) times daily as needed for moderate pain or headache.     Marland Kitchen amiodarone (PACERONE) 200 MG tablet Take 1 tablet (200 mg total) by mouth daily. 60 tablet 0  . anastrozole (ARIMIDEX) 1 MG tablet TAKE 1 TABLET (1 MG TOTAL) BY MOUTH DAILY. 30 tablet 5  . Cholecalciferol (VITAMIN D) 2000 UNITS CAPS Take by mouth.    . feeding supplement, ENSURE ENLIVE, (ENSURE ENLIVE) LIQD Take 237 mLs by mouth 2 (two) times daily between meals. 237 mL 12  . ferrous sulfate 325 (65 FE) MG tablet Take 1 tablet (325 mg total) by mouth 3 (three) times daily with meals. 90 tablet 0  . furosemide (LASIX) 40  MG tablet Take 1 tablet (40 mg total) by mouth every morning. 30 tablet 1  . linagliptin (TRADJENTA) 5 MG TABS tablet Take 5 mg by mouth daily.    . metoprolol tartrate (LOPRESSOR) 25 MG tablet Take 0.5 tablets (12.5 mg total) by mouth 2 (two) times daily. 60 tablet 1  . Multiple Vitamin (MULTIVITAMIN WITH MINERALS) TABS Take 1 tablet by mouth every other day.     . thiamine 100 MG tablet Take 1 tablet (100 mg total) by mouth daily. 90 tablet 0  . VENTOLIN HFA 108 (90 BASE) MCG/ACT inhaler Inhale 2 puffs into the lungs every 4 (four) hours as needed for wheezing or shortness of breath.   0  . warfarin (COUMADIN) 2 MG tablet Take 2 mg by mouth daily at 6 PM. OR AS DIRECTED  0   No current facility-administered medications for this visit.      Review of Systems:     Cardiac Review of Systems: Y or N  Chest Pain [  n  ]  Resting SOB [ y  ] Exertional SOB  Blue.Reese  ]  Orthopnea Blue.Reese  ]   Pedal Edema [  y ]    Palpitations [ y ] Syncope  [n  ]   Presyncope [ y  ]  General Review of Systems: [Y] = yes [  ]=no Constitional: recent weight change [  ];  Wt loss over the last 3 months [   ] anorexia [  ]; fatigue [  ]; nausea [  ]; night sweats [  ]; fever [  ]; or chills [  ];          Dental: poor dentition[  ]; Last Dentist visit:   Eye : blurred vision [  ]; diplopia [   ]; vision changes [  ];  Amaurosis fugax[  ]; Resp: cough [  ];  wheezing[  ];  hemoptysis[  ]; shortness of breath[  ]; paroxysmal nocturnal dyspnea[  ]; dyspnea on exertion[  ]; or orthopnea[  ];  GI:  gallstones[  ], vomiting[  ];  dysphagia[  ]; melena[  ];  hematochezia [  ]; heartburn[  ];   Hx of  Colonoscopy[  ]; GU: kidney stones [  ]; hematuria[  ];   dysuria [  ];  nocturia[  ];  history of     obstruction [  ]; urinary frequency [  ]             Skin: rash, swelling[  ];, hair loss[  ];  peripheral edema[  ];  or itching[  ]; Musculosketetal: myalgias[  ];  joint swelling[  ];  joint erythema[  ];  joint pain[  ];  back  pain[  ];  Heme/Lymph: bruising[  ];  bleeding[  ];  anemia[  ];  Neuro: TIA[  ];  headaches[  ];  stroke[  ];  vertigo[  ];  seizures[  ];   paresthesias[  ];  difficulty walking[  ];  Psych:depression[  ]; anxiety[  ];  Endocrine: diabetes[  ];  thyroid dysfunction[  ];  Immunizations: Flu up to date [  ]; Pneumococcal up to date [  ];  Other:  Physical Exam: BP 97/57 mmHg  Pulse 50  Resp 20  Ht '5\' 4"'$  (1.626 m)  Wt 133 lb (60.328 kg)  BMI 22.82 kg/m2  SpO2   PHYSICAL EXAMINATION: General appearance: alert, cooperative and appears older than stated age Head: Normocephalic, without obvious abnormality, atraumatic Neck: no adenopathy, no carotid bruit, no JVD, supple, symmetrical, trachea midline and thyroid not enlarged, symmetric, no tenderness/mass/nodules Lymph nodes: Cervical, supraclavicular, and axillary nodes normal. Resp: diminished breath sounds bibasilar Back: symmetric, no curvature. ROM normal. No CVA tenderness. Cardio: irregularly irregular rhythm GI: soft, non-tender; bowel sounds normal; no masses,  no organomegaly Extremities: edema 3+ edema bilaterally Neurologic: Grossly normal  Diagnostic Studies & Laboratory data:     Recent Radiology Findings:  Dg Chest 2 View  12/28/2014  CLINICAL DATA:  Pleural effusion. EXAM: CHEST  2 VIEW COMPARISON:  12/11/2014.  11/16/2014.  CT 09/21/2014 FINDINGS: Stable right paratracheal substernal mass consistent previously thyroid lesion. Stable cardiomegaly. No focal acute infiltrate. Scratch Stable small right pleural effusion. Stable small left pleural effusion. Right chest tube in stable position. No pneumothorax. Surgical clips noted over the right chest. IMPRESSION: 1. Right chest tube in stable position. Stable bilateral pleural effusions. 2. Scratched stable large right paratracheal thyroid mass. Electronically Signed   By: Marcello Moores  Register   On: 12/28/2014 12:32    I have independently reviewed the above radiologic  studies.  Recent Lab Findings: Lab Results  Component Value Date   WBC 6.1 11/07/2014   HGB 10.6* 11/07/2014   HCT 32.7* 11/07/2014   PLT  11/07/2014    PLATELET CLUMPS NOTED ON SMEAR, COUNT APPEARS ADEQUATE   GLUCOSE 103* 11/08/2014   ALT 35 11/07/2014   AST 37 11/07/2014   NA 140 11/08/2014   K 4.0 11/08/2014   CL 107 11/08/2014   CREATININE 2.27* 11/08/2014   BUN 50* 11/08/2014   CO2 24 11/08/2014   TSH 1.115 08/23/2014   INR 2.68* 11/08/2014   HGBA1C 6.2* 07/27/2014      Assessment / Plan:   #1 continued bilateral pleural effusions right greater than left in spite talc pleurodesis via the right Pleurx catheter- the catheter was  drained today and 500 mL came out. Her chest x-ray looks improved compared to her discharge from.    #2 pateint feels better since starting o2, resting O2 sats reported by the home nurse are always greater than 90. I suggested discussing with Dr. Einar Gip  a walk test and if her oxygen sats drop with exertion consider oxygen therapy.  #3 increasing fluid retention/ heart failure with increased pedal edema and anasarca, Dr Einar Gip to see due to increasing fluid retention and to recheck lytes renal function,  consider referral to HF clinic   Plan to see the patient back in 4 weeks with chest xray, can consider repeat talc pleurodesis but little help before       [name] MD      [address] [place] [address] Office [phone]   Beeper [phone]  12/28/2014 1:09 PM

## 2014-12-29 ENCOUNTER — Other Ambulatory Visit: Payer: Self-pay | Admitting: *Deleted

## 2014-12-29 NOTE — Patient Outreach (Signed)
Call placed to member to reschedule missed home visit.  No answer, HIPPA compliant voice message left, will await call back.  Valente David, BSN, [place] [facility]  [facility] Care Manager [phone]

## 2015-01-01 ENCOUNTER — Emergency Department (HOSPITAL_COMMUNITY): Payer: Commercial Managed Care - HMO

## 2015-01-01 ENCOUNTER — Inpatient Hospital Stay (HOSPITAL_COMMUNITY)
Admission: EM | Admit: 2015-01-01 | Discharge: 2015-01-10 | DRG: 987 | Disposition: A | Discharge status: Home Health | Payer: Commercial Managed Care - HMO | Attending: Internal Medicine | Admitting: Internal Medicine

## 2015-01-01 ENCOUNTER — Encounter (HOSPITAL_COMMUNITY): Payer: Self-pay | Admitting: Emergency Medicine

## 2015-01-01 ENCOUNTER — Inpatient Hospital Stay (HOSPITAL_COMMUNITY): Payer: Commercial Managed Care - HMO

## 2015-01-01 DIAGNOSIS — Z79899 Other long term (current) drug therapy: Secondary | ICD-10-CM | POA: Diagnosis not present

## 2015-01-01 DIAGNOSIS — I313 Pericardial effusion (noninflammatory): Secondary | ICD-10-CM | POA: Diagnosis present

## 2015-01-01 DIAGNOSIS — D0512 Intraductal carcinoma in situ of left breast: Secondary | ICD-10-CM | POA: Diagnosis present

## 2015-01-01 DIAGNOSIS — D509 Iron deficiency anemia, unspecified: Secondary | ICD-10-CM | POA: Diagnosis present

## 2015-01-01 DIAGNOSIS — K219 Gastro-esophageal reflux disease without esophagitis: Secondary | ICD-10-CM | POA: Diagnosis present

## 2015-01-01 DIAGNOSIS — Z87891 Personal history of nicotine dependence: Secondary | ICD-10-CM | POA: Diagnosis not present

## 2015-01-01 DIAGNOSIS — Z9013 Acquired absence of bilateral breasts and nipples: Secondary | ICD-10-CM

## 2015-01-01 DIAGNOSIS — I132 Hypertensive heart and chronic kidney disease with heart failure and with stage 5 chronic kidney disease, or end stage renal disease: Secondary | ICD-10-CM | POA: Diagnosis present

## 2015-01-01 DIAGNOSIS — R1110 Vomiting, unspecified: Secondary | ICD-10-CM

## 2015-01-01 DIAGNOSIS — Z7901 Long term (current) use of anticoagulants: Secondary | ICD-10-CM

## 2015-01-01 DIAGNOSIS — E7800 Pure hypercholesterolemia, unspecified: Secondary | ICD-10-CM | POA: Diagnosis present

## 2015-01-01 DIAGNOSIS — R791 Abnormal coagulation profile: Secondary | ICD-10-CM | POA: Diagnosis present

## 2015-01-01 DIAGNOSIS — Z923 Personal history of irradiation: Secondary | ICD-10-CM

## 2015-01-01 DIAGNOSIS — Z8249 Family history of ischemic heart disease and other diseases of the circulatory system: Secondary | ICD-10-CM

## 2015-01-01 DIAGNOSIS — I5033 Acute on chronic diastolic (congestive) heart failure: Secondary | ICD-10-CM | POA: Diagnosis present

## 2015-01-01 DIAGNOSIS — E1129 Type 2 diabetes mellitus with other diabetic kidney complication: Secondary | ICD-10-CM | POA: Diagnosis present

## 2015-01-01 DIAGNOSIS — Z7401 Bed confinement status: Secondary | ICD-10-CM

## 2015-01-01 DIAGNOSIS — E1122 Type 2 diabetes mellitus with diabetic chronic kidney disease: Secondary | ICD-10-CM | POA: Diagnosis present

## 2015-01-01 DIAGNOSIS — I9589 Other hypotension: Secondary | ICD-10-CM | POA: Diagnosis present

## 2015-01-01 DIAGNOSIS — Z833 Family history of diabetes mellitus: Secondary | ICD-10-CM | POA: Diagnosis not present

## 2015-01-01 DIAGNOSIS — E43 Unspecified severe protein-calorie malnutrition: Secondary | ICD-10-CM | POA: Diagnosis present

## 2015-01-01 DIAGNOSIS — D631 Anemia in chronic kidney disease: Secondary | ICD-10-CM | POA: Diagnosis not present

## 2015-01-01 DIAGNOSIS — N19 Unspecified kidney failure: Secondary | ICD-10-CM | POA: Diagnosis not present

## 2015-01-01 DIAGNOSIS — N184 Chronic kidney disease, stage 4 (severe): Secondary | ICD-10-CM | POA: Diagnosis not present

## 2015-01-01 DIAGNOSIS — C50812 Malignant neoplasm of overlapping sites of left female breast: Secondary | ICD-10-CM | POA: Diagnosis not present

## 2015-01-01 DIAGNOSIS — I509 Heart failure, unspecified: Secondary | ICD-10-CM | POA: Diagnosis not present

## 2015-01-01 DIAGNOSIS — N186 End stage renal disease: Secondary | ICD-10-CM | POA: Diagnosis present

## 2015-01-01 DIAGNOSIS — I129 Hypertensive chronic kidney disease with stage 1 through stage 4 chronic kidney disease, or unspecified chronic kidney disease: Secondary | ICD-10-CM | POA: Diagnosis present

## 2015-01-01 DIAGNOSIS — N189 Chronic kidney disease, unspecified: Secondary | ICD-10-CM

## 2015-01-01 DIAGNOSIS — E875 Hyperkalemia: Secondary | ICD-10-CM | POA: Diagnosis present

## 2015-01-01 DIAGNOSIS — Z86010 Personal history of colonic polyps: Secondary | ICD-10-CM | POA: Diagnosis not present

## 2015-01-01 DIAGNOSIS — K5900 Constipation, unspecified: Secondary | ICD-10-CM | POA: Diagnosis not present

## 2015-01-01 DIAGNOSIS — Z8711 Personal history of peptic ulcer disease: Secondary | ICD-10-CM

## 2015-01-01 DIAGNOSIS — Z95828 Presence of other vascular implants and grafts: Secondary | ICD-10-CM

## 2015-01-01 DIAGNOSIS — D472 Monoclonal gammopathy: Secondary | ICD-10-CM | POA: Diagnosis present

## 2015-01-01 DIAGNOSIS — E010 Iodine-deficiency related diffuse (endemic) goiter: Secondary | ICD-10-CM

## 2015-01-01 DIAGNOSIS — E0500 Thyrotoxicosis with diffuse goiter without thyrotoxic crisis or storm: Secondary | ICD-10-CM | POA: Diagnosis present

## 2015-01-01 DIAGNOSIS — R64 Cachexia: Secondary | ICD-10-CM | POA: Diagnosis present

## 2015-01-01 DIAGNOSIS — Z992 Dependence on renal dialysis: Secondary | ICD-10-CM

## 2015-01-01 DIAGNOSIS — I959 Hypotension, unspecified: Secondary | ICD-10-CM | POA: Diagnosis present

## 2015-01-01 DIAGNOSIS — M810 Age-related osteoporosis without current pathological fracture: Secondary | ICD-10-CM | POA: Diagnosis present

## 2015-01-01 DIAGNOSIS — I480 Paroxysmal atrial fibrillation: Secondary | ICD-10-CM | POA: Diagnosis present

## 2015-01-01 DIAGNOSIS — Z6821 Body mass index (BMI) 21.0-21.9, adult: Secondary | ICD-10-CM

## 2015-01-01 DIAGNOSIS — J939 Pneumothorax, unspecified: Secondary | ICD-10-CM

## 2015-01-01 DIAGNOSIS — N179 Acute kidney failure, unspecified: Secondary | ICD-10-CM

## 2015-01-01 DIAGNOSIS — Z808 Family history of malignant neoplasm of other organs or systems: Secondary | ICD-10-CM | POA: Diagnosis not present

## 2015-01-01 DIAGNOSIS — J90 Pleural effusion, not elsewhere classified: Secondary | ICD-10-CM | POA: Diagnosis present

## 2015-01-01 DIAGNOSIS — E0590 Thyrotoxicosis, unspecified without thyrotoxic crisis or storm: Secondary | ICD-10-CM | POA: Insufficient documentation

## 2015-01-01 DIAGNOSIS — L8990 Pressure ulcer of unspecified site, unspecified stage: Secondary | ICD-10-CM | POA: Insufficient documentation

## 2015-01-01 DIAGNOSIS — E1165 Type 2 diabetes mellitus with hyperglycemia: Secondary | ICD-10-CM | POA: Diagnosis present

## 2015-01-01 DIAGNOSIS — Z7189 Other specified counseling: Secondary | ICD-10-CM | POA: Diagnosis not present

## 2015-01-01 DIAGNOSIS — E049 Nontoxic goiter, unspecified: Secondary | ICD-10-CM | POA: Diagnosis present

## 2015-01-01 DIAGNOSIS — J3800 Paralysis of vocal cords and larynx, unspecified: Secondary | ICD-10-CM | POA: Diagnosis present

## 2015-01-01 DIAGNOSIS — Z515 Encounter for palliative care: Secondary | ICD-10-CM | POA: Diagnosis not present

## 2015-01-01 LAB — COMPREHENSIVE METABOLIC PANEL
ALT: 30 U/L (ref 14–54)
AST: 28 U/L (ref 15–41)
Albumin: 2.6 g/dL — ABNORMAL LOW (ref 3.5–5.0)
Alkaline Phosphatase: 93 U/L (ref 38–126)
Anion gap: 9 (ref 5–15)
BILIRUBIN TOTAL: 0.3 mg/dL (ref 0.3–1.2)
BUN: 159 mg/dL — AB (ref 6–20)
CO2: 20 mmol/L — ABNORMAL LOW (ref 22–32)
Calcium: 8.9 mg/dL (ref 8.9–10.3)
Chloride: 110 mmol/L (ref 101–111)
Creatinine, Ser: 6.99 mg/dL — ABNORMAL HIGH (ref 0.44–1.00)
GFR, EST AFRICAN AMERICAN: 6 mL/min — AB (ref >60)
GFR, EST NON AFRICAN AMERICAN: 5 mL/min — AB (ref >60)
Glucose, Bld: 127 mg/dL — ABNORMAL HIGH (ref 65–99)
POTASSIUM: 5.9 mmol/L — AB (ref 3.5–5.1)
Sodium: 139 mmol/L (ref 135–145)
TOTAL PROTEIN: 5.5 g/dL — AB (ref 6.5–8.1)

## 2015-01-01 LAB — GLUCOSE, CAPILLARY: Glucose-Capillary: 107 mg/dL — ABNORMAL HIGH (ref 65–99)

## 2015-01-01 LAB — CBC
HCT: 28.3 % — ABNORMAL LOW (ref 36.0–46.0)
Hemoglobin: 8.9 g/dL — ABNORMAL LOW (ref 12.0–15.0)
MCH: 27.1 pg (ref 26.0–34.0)
MCHC: 31.4 g/dL (ref 30.0–36.0)
MCV: 86.3 fL (ref 78.0–100.0)
PLATELETS: 319 10*3/uL (ref 150–400)
RBC: 3.28 MIL/uL — AB (ref 3.87–5.11)
RDW: 17.3 % — AB (ref 11.5–15.5)
WBC: 4.8 10*3/uL (ref 4.0–10.5)

## 2015-01-01 LAB — URINALYSIS, ROUTINE W REFLEX MICROSCOPIC
Bilirubin Urine: NEGATIVE
Glucose, UA: NEGATIVE mg/dL
Hgb urine dipstick: NEGATIVE
Ketones, ur: NEGATIVE mg/dL
Nitrite: NEGATIVE
PH: 5 (ref 5.0–8.0)
Protein, ur: NEGATIVE mg/dL
SPECIFIC GRAVITY, URINE: 1.013 (ref 1.005–1.030)
Urobilinogen, UA: 0.2 mg/dL (ref 0.0–1.0)

## 2015-01-01 LAB — URINE MICROSCOPIC-ADD ON

## 2015-01-01 LAB — IRON AND TIBC
IRON: 20 ug/dL — AB (ref 28–170)
Saturation Ratios: 7 % — ABNORMAL LOW (ref 10.4–31.8)
TIBC: 277 ug/dL (ref 250–450)
UIBC: 257 ug/dL

## 2015-01-01 LAB — CREATININE, URINE, RANDOM: CREATININE, URINE: 110.18 mg/dL

## 2015-01-01 LAB — BRAIN NATRIURETIC PEPTIDE: B Natriuretic Peptide: 2364.3 pg/mL — ABNORMAL HIGH (ref 0.0–100.0)

## 2015-01-01 LAB — I-STAT TROPONIN, ED: TROPONIN I, POC: 0.22 ng/mL — AB (ref 0.00–0.08)

## 2015-01-01 LAB — PROTEIN / CREATININE RATIO, URINE
Creatinine, Urine: 112.75 mg/dL
PROTEIN CREATININE RATIO: 0.25 mg/mg{creat} — AB (ref 0.00–0.15)
TOTAL PROTEIN, URINE: 28 mg/dL

## 2015-01-01 LAB — MRSA PCR SCREENING: MRSA by PCR: NEGATIVE

## 2015-01-01 LAB — SODIUM, URINE, RANDOM: Sodium, Ur: 23 mmol/L

## 2015-01-01 LAB — FERRITIN: Ferritin: 26 ng/mL (ref 11–307)

## 2015-01-01 LAB — TSH: TSH: <0.01 u[IU]/mL — ABNORMAL LOW (ref 0.350–4.500)

## 2015-01-01 MED ORDER — SODIUM CHLORIDE 0.9 % IV BOLUS (SEPSIS)
500.0000 mL | Freq: Once | INTRAVENOUS | Status: AC
  Administered 2015-01-01: 500 mL via INTRAVENOUS

## 2015-01-01 MED ORDER — AMIODARONE HCL 200 MG PO TABS
200.0000 mg | ORAL_TABLET | Freq: Every day | ORAL | Status: DC
  Administered 2015-01-01 – 2015-01-09 (×9): 200 mg via ORAL
  Filled 2015-01-01 (×9): qty 1

## 2015-01-01 MED ORDER — SODIUM CHLORIDE 0.9 % IV SOLN: 250.0000 mL | INTRAVENOUS | Status: DC | PRN

## 2015-01-01 MED ORDER — ENSURE ENLIVE PO LIQD
237.0000 mL | Freq: Two times a day (BID) | ORAL | Status: DC
  Administered 2015-01-01 – 2015-01-03 (×5): 237 mL via ORAL

## 2015-01-01 MED ORDER — ACETAMINOPHEN 325 MG PO TABS
650.0000 mg | ORAL_TABLET | ORAL | Status: DC | PRN
  Administered 2015-01-01 – 2015-01-08 (×3): 650 mg via ORAL
  Filled 2015-01-01 (×3): qty 2

## 2015-01-01 MED ORDER — SODIUM CHLORIDE 0.9 % IJ SOLN
3.0000 mL | Freq: Two times a day (BID) | INTRAMUSCULAR | Status: DC
  Administered 2015-01-01 – 2015-01-10 (×14): 3 mL via INTRAVENOUS

## 2015-01-01 MED ORDER — ACETAMINOPHEN 500 MG PO TABS: 500.0000 mg | ORAL_TABLET | Freq: Two times a day (BID) | ORAL | Status: DC | PRN

## 2015-01-01 MED ORDER — FERROUS SULFATE 325 (65 FE) MG PO TABS
325.0000 mg | ORAL_TABLET | Freq: Three times a day (TID) | ORAL | Status: DC
  Administered 2015-01-01: 325 mg via ORAL
  Filled 2015-01-01: qty 1

## 2015-01-01 MED ORDER — ALBUTEROL SULFATE (2.5 MG/3ML) 0.083% IN NEBU: 3.0000 mL | INHALATION_SOLUTION | RESPIRATORY_TRACT | Status: DC | PRN

## 2015-01-01 MED ORDER — INSULIN ASPART 100 UNIT/ML ~~LOC~~ SOLN
0.0000 [IU] | Freq: Three times a day (TID) | SUBCUTANEOUS | Status: DC
  Administered 2015-01-02 – 2015-01-03 (×3): 1 [IU] via SUBCUTANEOUS
  Administered 2015-01-08: 2 [IU] via SUBCUTANEOUS
  Administered 2015-01-09: 1 [IU] via SUBCUTANEOUS

## 2015-01-01 MED ORDER — SODIUM POLYSTYRENE SULFONATE 15 GM/60ML PO SUSP
30.0000 g | Freq: Once | ORAL | Status: AC
  Administered 2015-01-01: 30 g via ORAL
  Filled 2015-01-01: qty 120

## 2015-01-01 MED ORDER — VITAMIN B-1 100 MG PO TABS
100.0000 mg | ORAL_TABLET | Freq: Every day | ORAL | Status: DC
  Administered 2015-01-01 – 2015-01-10 (×10): 100 mg via ORAL
  Filled 2015-01-01 (×10): qty 1

## 2015-01-01 MED ORDER — WARFARIN SODIUM 2 MG PO TABS: 2.0000 mg | ORAL_TABLET | Freq: Every day | ORAL | Status: DC

## 2015-01-01 MED ORDER — SODIUM CHLORIDE 0.9 % IJ SOLN: 3.0000 mL | INTRAMUSCULAR | Status: DC | PRN

## 2015-01-01 MED ORDER — ONDANSETRON HCL 4 MG/2ML IJ SOLN
4.0000 mg | Freq: Four times a day (QID) | INTRAMUSCULAR | Status: DC | PRN
  Administered 2015-01-04 – 2015-01-07 (×10): 4 mg via INTRAVENOUS
  Filled 2015-01-01 (×9): qty 2

## 2015-01-01 MED ORDER — ANASTROZOLE 1 MG PO TABS
1.0000 mg | ORAL_TABLET | Freq: Every day | ORAL | Status: DC
  Administered 2015-01-01 – 2015-01-10 (×10): 1 mg via ORAL
  Filled 2015-01-01 (×11): qty 1

## 2015-01-01 NOTE — ED Notes (Signed)
Attempted report X1

## 2015-01-01 NOTE — Consult Note (Signed)
Alison Gross is an 81 y.o. female referred by Dr Alison Gross   Chief Complaint: Acute on CKD 4, hyperkalemia HPI: 81yo BF with CKD 4 sec HTN +/- DM ( with hx Alison Gross) presents to ER with increasing SOB and edema.  Pt followed by Dr Alison Gross and last seen 12/01/14 at which time had 2+ edema and SBP 98.  Scr baseline in the 2' and was 2.27 11/08/14 but increased to 4  On 12/14/14 and today 6.99 with BUN 159.   Denies any nephrotoxins.  She says PO intake OK but husband says no.  She has a hx of Alison Gross with increased lambda light chains.  Echo 7/16 did not show characteristics of amyloid.  UA from 8/16 showed no protein.  Her lasix was changed to torsemide last week and she did notice increased UO for a few days.  Ten year hx of HTN/DM though possibly longer as she did not regularly see Drs  Past Medical History  Diagnosis Date  . Hypercholesterolemia     takes Crestor daily  . Thyroid disease     had iodine radiation  . Hypertension     takes Hyzaar daily  . Dysrhythmia     takes Carvedilol daily  . Pneumonia     history of;last time about 4-63yr ago  . GERD (gastroesophageal reflux disease)     takes Protonix daily  . Gastric ulcer   . History of blood transfusion   . History of colon polyps   . Anemia     takes iron pill daily  . Diabetes mellitus without complication ([facility]     takes Tradjenta daily  . History of radiation therapy 07/12/12-08/26/12    right breast/  . Paroxysmal atrial fibrillation ([facility]     PCP EKG 09/27/2013: A. Fibrillation.. EKG 09/29/2013: S. Tach.  . Breast cancer ([facility] 04/12/12    right-pos lymph node/left-DCIS  . Shortness of breath on exertion   . Bruit of left carotid artery   . Acute upper GI bleeding 01/24/2012  . MGUS (monoclonal gammopathy of unknown significance) 04/21/2013  . Osteoporosis 11/29/2013  . Recurrent right pleural effusion 07/26/2014  . Stage III chronic kidney disease 07/26/2014  . Goiter 07/27/2014  . Substernal goiter 12/23/2012  . Pleural  effusion, right 12/23/2012  . Type 2 diabetes mellitus with renal manifestations ([facility] 07/26/2014  . Dysphonia 09/20/2014  . Vocal cord paralysis     Suspected due to massive substernal goiter  . CHF (congestive heart failure) ([facility]     Past Surgical History  Procedure Laterality Date  . Carpal tunnel release Left   . Breast biopsy    . Esophagogastroduodenoscopy  01/24/2012    Procedure: ESOPHAGOGASTRODUODENOSCOPY (EGD);  Surgeon: MJeryl Columbia Gross;  [facility];  Service: Endoscopy;  Laterality: N/A;  . Thyroid surgery      "removed goiter"  . Cystectomy      from back of neck  . Knee surgery      right with rods  . Esophagogastroduodenoscopy    . Colonoscopy    . Total mastectomy Bilateral 05/10/2012    Procedure: RIGHT modified mastectomy; LEFT total mastectomy;  Surgeon: Alison Gross;  [place]  Service: General;  Laterality: Bilateral;  . Axillary sentinel node biopsy Right 05/10/2012    Procedure: AXILLARY SENTINEL lymph  NODE BIOPSY;  Surgeon: Alison Gross;  Location: [facility]  Service: General;  Laterality: Right;  nuclear medicine injection right side  7:00 am   .  Abdominal hysterectomy      fibroids, with bilateral SO  . Chest tube insertion Right 09/27/2014    Procedure: INSERTION OF RIGHT PLEURAL DRAINAGE CATHETER;  Surgeon: Alison Gross;  Location: [place];  Service: Thoracic;  Laterality: Right;    Family History  Problem Relation Age of Onset  . Brain cancer Mother   . Aneurysm Father   . Diabetes Sister   . Diabetes Brother   . Diabetes Sister   . Diabetes Brother   . Heart failure Sister   FH neg for ESRD  Social History:  reports that she quit smoking about 43 years ago. Her smoking use included Cigarettes. She smoked 0.00 packs per day. She has never used smokeless tobacco. She reports that she does not drink alcohol or use illicit drugs.  Married lives with husband  Allergies: No Known Allergies   (Not in a  hospital admission)   Lab Results: UA: ND   Recent Labs  01/01/15 1125  WBC 4.8  HGB 8.9*  HCT 28.3*  PLT 319   BMET  Recent Labs  01/01/15 1125  NA 139  K 5.9*  CL 110  CO2 20*  GLUCOSE 127*  BUN 159*  CREATININE 6.99*  CALCIUM 8.9   LFT  Recent Labs  01/01/15 1125  PROT 5.5*  ALBUMIN 2.6*  AST 28  ALT 30  ALKPHOS 93  BILITOT 0.3   Dg Chest 2 View  01/01/2015  CLINICAL DATA:  Shortness of Breath EXAM: CHEST  2 VIEW COMPARISON:  December 28, 2014 chest radiograph and CT of the neck and upper chest September 21, 2014 FINDINGS: There remain small pleural effusions bilaterally with bibasilar atelectasis. There is a drainage catheter on the left, unchanged in position. No pneumothorax. Heart is enlarged with pulmonary vascular within normal limits. The large right substernal mass, noted previously to represent marked thyroid enlargement, is again noted and stable. No adenopathy appreciable. IMPRESSION: The marked enlargement of the right lobe of the thyroid is again noted. There are small pleural effusions bilaterally with bibasilar atelectasis. In comparison with recent chest radiograph, there is slightly less atelectasis and effusion on the left and right sides. No new opacity. No change in cardiac silhouette. Electronically Signed   By: Alison Grip III M.D.   On: 01/01/2015 12:48    ROS: No change in vision + SOB, On chronic O2.  Chronic pleurex tube rt chest No CP No change in bowels No new arthritic CO Nodysuria   PHYSICAL EXAM: Blood pressure 91/47, pulse 56, temperature 98.3 F (36.8 C), temperature source Oral, resp. rate 20, height '5\' 5"'$  (1.651 m), weight 59.875 kg (132 lb), SpO2 100 %. HEENT: PERRLA EOMI NECK:+ JVD LUNGS:Rare crackle, decreased BS Lt base.  Pleurex tube Rt chest CARDIAC:Bradycardic 2/6 systolic LSB and apex ABD:+ BS NTND No HSM EXT:3-4+ edema NEURO:CNI Ox3 No asterixis  Assessment: 1. Acute on CKD 4.  Her BUN of 159 suggest pre  renal though not sure if this is due to poor CO verus some over diuresis with intravascular depletion (though total body volume overloaded) 2. Hyperkalemia 3. Diastolic Dysfunction 4. Hx A fib though SR now 5. Hx Alison Gross 6. Anemia PLAN: 1. Received 500cc iv fluids in ER.  Will hold diuretics for now and allow PO fluids.  If UO marginal and Scr higher in AM then will consider IV lasix 2. Kayexalate 3. Renal US 4. UA with UNa and cr 5. Renal diet 6. Check iron studies and PTH  7. I discussed with her the role of HD if renal fx does not improve and asked her to think about whether that is something she would want to do   Autumnrose Yore T 01/01/2015, 2:18 PM

## 2015-01-01 NOTE — Progress Notes (Signed)
[place], PA-C notified of critical INR value of 5.88. Will continue to monitor.

## 2015-01-01 NOTE — H&P (Signed)
[facility] History and Physical                                                                                    Alison Gross, is a 81 y.o. female  MRN: [id number]   DOB - 1932-09-25  Admit Date - 01/01/2015  Outpatient Primary MD for the patient is Alison Greenland, MD  Referring Physician:  Dr. Evelina Bucy  Chief Complaint:   Chief Complaint  Patient presents with  . Shortness of Breath     HPI  Alison Gross  is a 81 y.o. female, with PAfib, CHF, MGUS, BRCA, Recurrent exudative pleural effusion s/p pleurx cath, and CKD stage 4  - who presents to the ED for worsening DOE and weakness.  Per the patient she is normally able to ambulate to the bathroom and back without being winded. Over the last few days she is unable to take more than 2 or 3 steps before she becomes short of breath. She has also had worsening bilateral lower extremity edema. The patient denies fever, sore throat, chills. She reports having palpitations when she attempts to exert herself. She saw Dr.Ganji on Wednesday 10/19 and he changed her lasix to Demadex in order to remedy the lower extremity edema.  Today in the emergency department her creatinine has increased from a baseline of 2.5 to 6.9.  BNP is elevated at 2364, unfortunately her blood pressure is low with a systolic in the 35H to 29J. INR is elevated at 5.8. She has an ongoing elevated troponin that in the past has been attributed to diastolic heart failure.  [facility] will admit for acute on chronic renal failure, acute on chronic diastolic heart failure, and hypotension.  [name] was admitted on 11/07/2014 by Dr. Einar Gip with a similar picture - acutely worsening shortness of breath with declining renal function.  The declining renal function was felt to be due to recurrent pleural effusion and continued pleural drain (between 600-700 mL daily).  She underwent talc pleurodesis but continued to have pleural drain. She developed DVTs in her right axillary and  subclavian veins and was placed on Coumadin. Palliative care consultation was held and the patient was made DO NOT RESUSCITATE. Unfortunately during the hospitalization her DO NOT RESUSCITATE was reversed and she is now a full code.   Review of Systems  Constitutional: Negative for fever and chills.  Eyes: Negative.   Respiratory: Positive for cough, sputum production and shortness of breath.   Cardiovascular: Positive for palpitations and leg swelling. Negative for chest pain.  Gastrointestinal: Positive for nausea. Negative for heartburn, vomiting and diarrhea.  Genitourinary: Negative.   Musculoskeletal: Negative.   Neurological: Positive for dizziness, weakness and headaches. Negative for tremors, sensory change, speech change, focal weakness, seizures and loss of consciousness.  Psychiatric/Behavioral: Negative.      Past Medical History  Past Medical History  Diagnosis Date  . Hypercholesterolemia     takes Crestor daily  . Thyroid disease     had iodine radiation  . Hypertension     takes Hyzaar daily  . Dysrhythmia     takes Carvedilol daily  . Pneumonia     history of;last  time about 4-61yr ago  . GERD (gastroesophageal reflux disease)     takes Protonix daily  . Gastric ulcer   . History of blood transfusion   . History of colon polyps   . Anemia     takes iron pill daily  . Diabetes mellitus without complication ([facility]     takes Tradjenta daily  . History of radiation therapy 07/12/12-08/26/12    right breast/  . Paroxysmal atrial fibrillation ([facility]     PCP EKG 09/27/2013: A. Fibrillation.. EKG 09/29/2013: S. Tach.  . [place] cancer ([facility] 04/12/12    right-pos lymph node/left-DCIS  . Shortness of breath on exertion   . Bruit of left carotid artery   . Acute upper GI bleeding 01/24/2012  . MGUS (monoclonal gammopathy of unknown significance) 04/21/2013  . Osteoporosis 11/29/2013  . Recurrent right pleural effusion 07/26/2014  . Stage III chronic kidney disease  07/26/2014  . Goiter 07/27/2014  . Substernal goiter 12/23/2012  . Pleural effusion, right 12/23/2012  . Type 2 diabetes mellitus with renal manifestations ([facility] 07/26/2014  . Dysphonia 09/20/2014  . Vocal cord paralysis     Suspected due to massive substernal goiter  . CHF (congestive heart failure) ([facility]     Past Surgical History  Procedure Laterality Date  . Carpal tunnel release Left   . Breast biopsy    . Esophagogastroduodenoscopy  01/24/2012    Procedure: ESOPHAGOGASTRODUODENOSCOPY (EGD);  Surgeon: MJeryl Columbia MD;  [facility];  Service: Endoscopy;  Laterality: N/A;  . Thyroid surgery      "removed goiter"  . Cystectomy      from back of neck  . Knee surgery      right with rods  . Esophagogastroduodenoscopy    . Colonoscopy    . Total mastectomy Bilateral 05/10/2012    Procedure: RIGHT modified mastectomy; LEFT total mastectomy;  Surgeon: CHaywood Lasso MD;  [place] [facility]  Service: General;  Laterality: Bilateral;  . Axillary sentinel node biopsy Right 05/10/2012    Procedure: AXILLARY SENTINEL lymph  NODE BIOPSY;  Surgeon: CHaywood Lasso MD;  [place] [facility]  Service: General;  Laterality: Right;  nuclear medicine injection right side  7:00 am   . Abdominal hysterectomy      fibroids, with bilateral SO  . Chest tube insertion Right 09/27/2014    Procedure: INSERTION OF RIGHT PLEURAL DRAINAGE CATHETER;  Surgeon: EGrace Isaac MD;  [place] [facility]  Service: Thoracic;  Laterality: Right;      Social History Social History  Substance Use Topics  . Smoking status: Former Smoker -- 0.00 packs/day    Types: Cigarettes    Quit date: 07/26/1971  . Smokeless tobacco: Never Used  . Alcohol Use: No    Family History Family History  Problem Relation Age of Onset  . Brain cancer Mother   . Aneurysm Father   . Diabetes Sister   . Diabetes Brother   . Diabetes Sister   . Diabetes Brother   . Heart failure Sister     Prior to Admission  medications   Medication Sig Start Date End Date Taking? Authorizing Provider  acetaminophen (TYLENOL) 500 MG tablet Take 500 mg by mouth 2 (two) times daily as needed for moderate pain or headache.    Yes Historical Provider, MD  amiodarone (PACERONE) 200 MG tablet Take 1 tablet (200 mg total) by mouth daily. 11/04/14  Yes JAdrian Prows MD  anastrozole (ARIMIDEX) 1 MG tablet TAKE 1 TABLET (1 MG  TOTAL) BY MOUTH DAILY. 10/17/14  Yes Laurie Panda, NP  Cholecalciferol (VITAMIN D) 2000 UNITS CAPS Take by mouth.   Yes Historical Provider, MD  feeding supplement, ENSURE ENLIVE, (ENSURE ENLIVE) LIQD Take 237 mLs by mouth 2 (two) times daily between meals. 07/28/14  Yes Venetia Maxon Rama, MD  ferrous sulfate 325 (65 FE) MG tablet Take 1 tablet (325 mg total) by mouth 3 (three) times daily with meals. 08/25/14  Yes Janece Canterbury, MD  linagliptin (TRADJENTA) 5 MG TABS tablet Take 5 mg by mouth daily.   Yes Historical Provider, MD  metoprolol tartrate (LOPRESSOR) 25 MG tablet Take 0.5 tablets (12.5 mg total) by mouth 2 (two) times daily. 11/08/14  Yes Adrian Prows, MD  Multiple Vitamin (MULTIVITAMIN WITH MINERALS) TABS Take 1 tablet by mouth every other day.    Yes Historical Provider, MD  thiamine 100 MG tablet Take 1 tablet (100 mg total) by mouth daily. 11/04/14  Yes Adrian Prows, MD  torsemide (DEMADEX) 20 MG tablet Take 20 mg by mouth daily. 12/29/14  Yes Historical Provider, MD  warfarin (COUMADIN) 2 MG tablet Take 2 mg by mouth daily at 6 PM. OR AS DIRECTED 10/16/14  Yes Historical Provider, MD  furosemide (LASIX) 40 MG tablet Take 1 tablet (40 mg total) by mouth every morning. Patient not taking: Reported on 01/01/2015 11/08/14   Adrian Prows, MD  VENTOLIN HFA 108 (90 BASE) MCG/ACT inhaler Inhale 2 puffs into the lungs every 4 (four) hours as needed for wheezing or shortness of breath.  10/05/14   Historical Provider, MD    No Known Allergies  Physical Exam  Vitals  Blood pressure 91/47, pulse 56, temperature  98.3 F (36.8 C), temperature source Oral, resp. rate 20, height _0  (1.651 m), weight 59.875 kg (132 lb), SpO2 100 %.   General: Thin, frail, elderly female lying in bed in NAD, she doesn't speak much. Family at bedside  Psych:  Normal affect and insight, Not Suicidal or Homicidal, Awake Alert, Oriented X 3.  Neuro:   No F.N deficits, ALL C.Nerves Intact, Strength 5/5 all 4 extremities, Sensation intact all 4 extremities.  ENT:  Ears and Eyes appear Normal, Conjunctivae clear, PER. Moist oral mucosa without erythema or exudates.  Neck:  Supple, positive JVD  Respiratory:  Symmetrical chest wall movement, Good air movement bilaterally, CTAB.  Cardiac:  RRR, + 2/6 systolic murmur, 2+ pitting edema bilaterally, positive JVP  Abdomen:  Positive bowel sounds, Soft, Non tender, Non distended,  No masses appreciated  Skin:  No Cyanosis, Normal Skin Turgor, No Skin Rash or Bruise.  Extremities:  Able to move all 4. 5/5 strength in each,  no effusions.  Data Review  Wt Readings from Last 3 Encounters:  01/01/15 59.875 kg (132 lb)  12/28/14 60.328 kg (133 lb)  12/11/14 58.06 kg (128 lb)    CBC  Recent Labs Lab 01/01/15 1125  WBC 4.8  HGB 8.9*  HCT 28.3*  PLT 319  MCV 86.3  MCH 27.1  MCHC 31.4  RDW 17.3*    Chemistries   Recent Labs Lab 01/01/15 1125  NA 139  K 5.9*  CL 110  CO2 20*  GLUCOSE 127*  BUN 159*  CREATININE 6.99*  CALCIUM 8.9  AST 28  ALT 30  ALKPHOS 93  BILITOT 0.3      Lab Results  Component Value Date   HGBA1C 6.2* 07/27/2014      Urinalysis    Component Value Date/Time   COLORURINE  YELLOW 10/31/2014 1803   APPEARANCEUR CLEAR 10/31/2014 1803   LABSPEC 1.016 10/31/2014 1803   PHURINE 5.5 10/31/2014 1803   GLUCOSEU NEGATIVE 10/31/2014 1803   HGBUR NEGATIVE 10/31/2014 1803   BILIRUBINUR NEGATIVE 10/31/2014 1803   KETONESUR NEGATIVE 10/31/2014 1803   PROTEINUR NEGATIVE 10/31/2014 1803   UROBILINOGEN 0.2 10/31/2014 1803    NITRITE NEGATIVE 10/31/2014 1803   LEUKOCYTESUR NEGATIVE 10/31/2014 1803    Imaging results:   Dg Chest 2 View  01/01/2015  CLINICAL DATA:  Shortness of Breath EXAM: CHEST  2 VIEW COMPARISON:  December 28, 2014 chest radiograph and CT of the neck and upper chest September 21, 2014 FINDINGS: There remain small pleural effusions bilaterally with bibasilar atelectasis. There is a drainage catheter on the left, unchanged in position. No pneumothorax. Heart is enlarged with pulmonary vascular within normal limits. The large right substernal mass, noted previously to represent marked thyroid enlargement, is again noted and stable. No adenopathy appreciable. IMPRESSION: The marked enlargement of the right lobe of the thyroid is again noted. There are small pleural effusions bilaterally with bibasilar atelectasis. In comparison with recent chest radiograph, there is slightly less atelectasis and effusion on the left and right sides. No new opacity. No change in cardiac silhouette. Electronically Signed   By: [name] III M.D.   On: 01/01/2015 12:48   Dg Chest 2 View  12/28/2014  CLINICAL DATA:  Pleural effusion. EXAM: CHEST  2 VIEW COMPARISON:  12/11/2014.  11/16/2014.  CT 09/21/2014 FINDINGS: Stable right paratracheal substernal mass consistent previously thyroid lesion. Stable cardiomegaly. No focal acute infiltrate. Scratch Stable small right pleural effusion. Stable small left pleural effusion. Right chest tube in stable position. No pneumothorax. Surgical clips noted over the right chest. IMPRESSION: 1. Right chest tube in stable position. Stable bilateral pleural effusions. 2. Scratched stable large right paratracheal thyroid mass. Electronically Signed   By: Marcello Moores  Register   On: 12/28/2014 12:32    My personal review of EKG: Sinus Rhythm LAFB, LVH.  No significant change   2D Echo 09/26/14 Study Conclusions  - Left ventricle: The cavity size was normal. There was severe concentric hypertrophy.  Systolic function was normal. The estimated ejection fraction was in the range of 60% to 65%. There was dynamic obstruction at restin the outflow tract, with mid-cavity obliteration. Wall motion was normal; there were no regional wall motion abnormalities. Features are consistent with a pseudonormal left ventricular filling pattern, with concomitant abnormal relaxation and increased filling pressure (grade 2 diastolic dysfunction). - Aortic valve: Quadricuspid; normal thickness leaflets. There was mild regurgitation. - Mitral valve: There was mild regurgitation. - Left atrium: The atrium was severely dilated. - Right atrium: The atrium was severely dilated. - Pericardium, extracardiac: A small, free-flowing pericardial effusion was identified circumferential to the heart. The fluid had no internal echoes.There was no evidence of hemodynamic compromise. There was a left pleural effusion.  Assessment & Plan  Principal Problem:   Acute on chronic renal failure (HCC) Active Problems:   Acute on chronic diastolic heart failure (HCC)   Hypotension   Diastolic CHF, acute on chronic (HCC)   Ductal carcinoma in situ (DCIS) of left breast   Pleural effusion exudative   MGUS (monoclonal gammopathy of unknown significance)   Type 2 diabetes mellitus with renal manifestations (HCC)   Substernal goiter   PAF (paroxysmal atrial fibrillation) (HCC)   Stage 4 chronic kidney disease due to arterionephrosclerosis (HCC)   Acute on Chronic Renal Failure Baseline creatinine is 2.5.  Today 6.99.  Suspect worsening renal disease is due to hypotension and recent switch from lasix to demedex (made on 12/27/14).  Discussed with Dr. Einar Gip who questions whether or not HD for Ultrafiltration only would be of benefit.  EDP consulted Nephrology.  Strict I&O.  Check urine specific gravity and urine sodium to determine if the patient is dry.  Will defer decision regarding diuretic therapy until these results  are back and Nephrology offers their recommendations.   Acute on chronic diastolic heart failure BNP elevated 2364, +increasing DOE, ++ bilateral lower extremity edema.  Discussed with Dr. Einar Gip, who will see the patient but also requested that the CHF team be consulted.  Holding BP medications (except amio) and diuretics at this time.  Will check daily weights, strict I&O, low salt diet.  Cards and CHF consulted.  Most recent echo results from 7/16 are listed above. Wt Readings from Last 3 Encounters:  01/01/15 59.875 kg (132 lb)  12/28/14 60.328 kg (133 lb)  12/11/14 58.06 kg (128 lb)     Hypotension Holding BP medications and diuretics.  At this point will avoid IVF due to HF exacerbation.  Patient is a full code and would like to be given pressors if necessary.   Type II DM Hgb 6.2.  Hold oral dm medications.  Place on SSI - S.  Watch closely for hypoglycemia as the patient has not been eating well.   Recurrent exudative pleural effusion Status post talc pleurodesis. Status post Pleurx catheter. Continues to drain 500-600 mL of fluid daily from a Pleurx catheter. This appears stable. Followed by Dr. Servando Snare.  Will need daily nursing care.   Paroxysmal atrial fibrillation Currently rate controlled. Holding metoprolol. Continue amiodarone. Continue Coumadin per pharmacy.   Supratherapeutic INR 5.8. Coumadin will be managed per pharmacy. Will not reverse the patient but rather allow her INR to drift down. She currently shows no signs of bleeding.   Goiter Stable. Check TSH.   Consultants Called:  Nephrology, Cardiology (Dr. Einar Gip), CHF team (Amy)  Family Communication:   Husband and dtr at bedside  Code Status:  Full code (discussed with patient at bedside)  Condition:  Guarded  Potential Disposition:  To be determined.     Time spent in minutes : [place],  [place] on 01/01/2015 at 1:55 PM Between 7am to 7pm - Pager -  [phone] After 7pm go to [email or web address] - password [email or web address] And look for the night coverage person covering me after hours   I have examined the patient, reviewed the chart and modified the above note. I agree with the assessment and plan as outlined above by Imogene Burn, PA-C.  Debbe Odea, MD

## 2015-01-01 NOTE — ED Notes (Signed)
Patient undressed, in gown, on monitor, continuous pulse oximetry, blood pressure cuff and oxygen [place] (2L); warm blanket given; visitor at bedside

## 2015-01-01 NOTE — Progress Notes (Signed)
Patient arrived to 2C17 via stretcher. Patient ambulated to bed with standby assist. CHG bath given. Patient denies any pain or discomfort at this time.

## 2015-01-01 NOTE — ED Provider Notes (Signed)
CSN: [id number]     Arrival date & time 01/01/15  1056 History   First MD Initiated Contact with Patient 01/01/15 1100     Chief Complaint  Patient presents with  . Shortness of Breath     (Consider location/radiation/quality/duration/timing/severity/associated sxs/prior Treatment) HPI Comments: Here shortness of breath. Denies any chest pain. Has a right Pleurex catheter, drained 550 mL out this morning, normal amount is 550-650cc a day.   Patient is a 81 y.o. female presenting with shortness of breath. The history is provided by the patient.  Shortness of Breath Severity:  Mild Onset quality:  Gradual Timing:  Constant Progression:  Worsening Chronicity:  New Relieved by:  Rest Worsened by:  Exertion Ineffective treatments:  None tried Associated symptoms: no abdominal pain, no cough, no fever, no rash and no vomiting     Past Medical History  Diagnosis Date  . Hypercholesterolemia     takes Crestor daily  . Thyroid disease     had iodine radiation  . Hypertension     takes Hyzaar daily  . Dysrhythmia     takes Carvedilol daily  . Pneumonia     history of;last time about 4-77yr ago  . GERD (gastroesophageal reflux disease)     takes Protonix daily  . Gastric ulcer   . History of blood transfusion   . History of colon polyps   . Anemia     takes iron pill daily  . Diabetes mellitus without complication ([facility]     takes Tradjenta daily  . History of radiation therapy 07/12/12-08/26/12    right breast/  . Paroxysmal atrial fibrillation ([facility]     PCP EKG 09/27/2013: A. Fibrillation.. EKG 09/29/2013: S. Tach.  . Breast cancer ([facility] 04/12/12    right-pos lymph node/left-DCIS  . Shortness of breath on exertion   . Bruit of left carotid artery   . Acute upper GI bleeding 01/24/2012  . MGUS (monoclonal gammopathy of unknown significance) 04/21/2013  . Osteoporosis 11/29/2013  . Recurrent right pleural effusion 07/26/2014  . Stage III chronic kidney disease 07/26/2014  .  Goiter 07/27/2014  . Substernal goiter 12/23/2012  . Pleural effusion, right 12/23/2012  . Type 2 diabetes mellitus with renal manifestations ([facility] 07/26/2014  . Dysphonia 09/20/2014  . Vocal cord paralysis     Suspected due to massive substernal goiter  . CHF (congestive heart failure) ([facility]    Past Surgical History  Procedure Laterality Date  . Carpal tunnel release Left   . Breast biopsy    . Esophagogastroduodenoscopy  01/24/2012    Procedure: ESOPHAGOGASTRODUODENOSCOPY (EGD);  Surgeon: MJeryl Columbia MD;  [facility];  Service: Endoscopy;  Laterality: N/A;  . Thyroid surgery      "removed goiter"  . Cystectomy      from back of neck  . Knee surgery      right with rods  . Esophagogastroduodenoscopy    . Colonoscopy    . Total mastectomy Bilateral 05/10/2012    Procedure: RIGHT modified mastectomy; LEFT total mastectomy;  Surgeon: CHaywood Lasso MD;  [facility]  Service: General;  Laterality: Bilateral;  . Axillary sentinel node biopsy Right 05/10/2012    Procedure: AXILLARY SENTINEL lymph  NODE BIOPSY;  Surgeon: CHaywood Lasso MD;  [facility]  Service: General;  Laterality: Right;  nuclear medicine injection right side  7:00 am   . Abdominal hysterectomy      fibroids, with bilateral SO  . Chest tube insertion Right 09/27/2014  Procedure: INSERTION OF RIGHT PLEURAL DRAINAGE CATHETER;  Surgeon: [name], MD;  Location: [place];  Service: Thoracic;  Laterality: Right;   Family History  Problem Relation Age of Onset  . Brain cancer Mother   . Aneurysm Father   . Diabetes Sister   . Diabetes Brother   . Diabetes Sister   . Diabetes Brother   . Heart failure Sister    Social History  Substance Use Topics  . Smoking status: Former Smoker -- 0.00 packs/day    Types: Cigarettes    Quit date: 07/26/1971  . Smokeless tobacco: Never Used  . Alcohol Use: No   OB History    No data available     Review of Systems  Constitutional:  Negative for fever.  Respiratory: Negative for cough and shortness of breath.   Gastrointestinal: Negative for vomiting and abdominal pain.  Skin: Negative for rash.  All other systems reviewed and are negative.     Allergies  Review of patient's allergies indicates no known allergies.  Home Medications   Prior to Admission medications   Medication Sig Start Date End Date Taking? Authorizing Provider  acetaminophen (TYLENOL) 500 MG tablet Take 500 mg by mouth 2 (two) times daily as needed for moderate pain or headache.     Historical Provider, MD  amiodarone (PACERONE) 200 MG tablet Take 1 tablet (200 mg total) by mouth daily. 11/04/14   Adrian Prows, MD  anastrozole (ARIMIDEX) 1 MG tablet TAKE 1 TABLET (1 MG TOTAL) BY MOUTH DAILY. 10/17/14   Laurie Panda, NP  Cholecalciferol (VITAMIN D) 2000 UNITS CAPS Take by mouth.    Historical Provider, MD  feeding supplement, ENSURE ENLIVE, (ENSURE ENLIVE) LIQD Take 237 mLs by mouth 2 (two) times daily between meals. 07/28/14   Venetia Maxon Rama, MD  ferrous sulfate 325 (65 FE) MG tablet Take 1 tablet (325 mg total) by mouth 3 (three) times daily with meals. 08/25/14   Janece Canterbury, MD  furosemide (LASIX) 40 MG tablet Take 1 tablet (40 mg total) by mouth every morning. 11/08/14   Adrian Prows, MD  linagliptin (TRADJENTA) 5 MG TABS tablet Take 5 mg by mouth daily.    Historical Provider, MD  metoprolol tartrate (LOPRESSOR) 25 MG tablet Take 0.5 tablets (12.5 mg total) by mouth 2 (two) times daily. 11/08/14   Adrian Prows, MD  Multiple Vitamin (MULTIVITAMIN WITH MINERALS) TABS Take 1 tablet by mouth every other day.     Historical Provider, MD  thiamine 100 MG tablet Take 1 tablet (100 mg total) by mouth daily. 11/04/14   Adrian Prows, MD  VENTOLIN HFA 108 (90 BASE) MCG/ACT inhaler Inhale 2 puffs into the lungs every 4 (four) hours as needed for wheezing or shortness of breath.  10/05/14   Historical Provider, MD  warfarin (COUMADIN) 2 MG tablet Take 2 mg by mouth  daily at 6 PM. OR AS DIRECTED 10/16/14   Historical Provider, MD   BP 90/43 mmHg  Pulse 58  Temp(Src) 98.3 F (36.8 C) (Oral)  Resp 17  Ht '5\' 5"'$  (1.651 m)  Wt 132 lb (59.875 kg)  BMI 21.97 kg/m2  SpO2 93% Physical Exam  Constitutional: She is oriented to person, place, and time. She appears well-developed and well-nourished. No distress.  HENT:  Head: Normocephalic and atraumatic.  Mouth/Throat: Oropharynx is clear and moist.  Eyes: EOM are normal. Pupils are equal, round, and reactive to light.  Neck: Normal range of motion. Neck supple.  Cardiovascular: Normal rate  and regular rhythm.  Exam reveals no friction rub.   No murmur heard. Pulmonary/Chest: Effort normal and breath sounds normal. No respiratory distress. She has no wheezes. She has no rales.  Abdominal: Soft. She exhibits no distension. There is no tenderness. There is no rebound.  Musculoskeletal: Normal range of motion. She exhibits edema (2+ bilateral lower extremities).  Neurological: She is alert and oriented to person, place, and time.  Skin: No rash noted. She is not diaphoretic.  Nursing note and vitals reviewed.   ED Course  Procedures (including critical care time) Labs Review Labs Reviewed  CBC  BRAIN NATRIURETIC PEPTIDE  COMPREHENSIVE METABOLIC PANEL  I-STAT TROPOININ, ED    Imaging Review Dg Chest 2 View  01/01/2015  CLINICAL DATA:  Shortness of Breath EXAM: CHEST  2 VIEW COMPARISON:  December 28, 2014 chest radiograph and CT of the neck and upper chest September 21, 2014 FINDINGS: There remain small pleural effusions bilaterally with bibasilar atelectasis. There is a drainage catheter on the left, unchanged in position. No pneumothorax. Heart is enlarged with pulmonary vascular within normal limits. The large right substernal mass, noted previously to represent marked thyroid enlargement, is again noted and stable. No adenopathy appreciable. IMPRESSION: The marked enlargement of the right lobe of the thyroid  is again noted. There are small pleural effusions bilaterally with bibasilar atelectasis. In comparison with recent chest radiograph, there is slightly less atelectasis and effusion on the left and right sides. No new opacity. No change in cardiac silhouette. Electronically Signed   By: [name] III M.D.   On: 01/01/2015 12:48   US Renal  01/01/2015  CLINICAL DATA:  Acute renal failure.  Diabetes. EXAM: RENAL / URINARY TRACT ULTRASOUND COMPLETE COMPARISON:  None. FINDINGS: Right Kidney: Length: 9.1 cm.  Diffusely echogenic.  No hydronephrosis. Left Kidney: Length: 9.4 cm. Diffusely echogenic. No hydronephrosis. Several tiny cysts. Bladder: Appears normal for degree of bladder distention. A small amount of free peritoneal fluid is noted. IMPRESSION: 1. Diffusely echogenic kidneys, compatible with medical renal disease. 2. No hydronephrosis. 3. Small amount of ascites. Electronically Signed   By: Claudie Revering M.D.   On: 01/01/2015 16:25   I have personally reviewed and evaluated these images and lab results as part of my medical decision-making.   EKG Interpretation   Date/Time:  Monday January 01 2015 11:04:43 EDT Ventricular Rate:  57 PR Interval:  213 QRS Duration: 118 QT Interval:  478 QTC Calculation: 465 R Axis:   -79 Text Interpretation:  Pacemaker spikes or artifacts Sinus rhythm  Borderline prolonged PR interval Left anterior fascicular block LVH with  secondary repolarization abnormality Anterior infarct, old No significant  change since last tracing Confirmed by Mingo Amber  MD, [place] ([id number]) on  01/01/2015 12:49:18 PM      MDM   Final diagnoses:  ARF (acute renal failure) ([place])  Hypotension Hyperkalemia  81 year old female here with shortness of breath. History of breast cancer and has a right Pleurx catheter for recurrent pleural effusions. She also has history of heart failure. She has pitting edema in bilateral lower extremities and has worsening shortness of breath  on exertion. Concern for possible CHF exacerbation versus worsening pleural effusion. Labs came back showing acute renal failure with creatinine going from the mid 2s to 7. K is 5.9. Fluids given in small aliquots. I spoke with renal who will see the patient. Medicine admitting to step down.    Evelina Bucy, MD 01/01/15 [phone]

## 2015-01-01 NOTE — Progress Notes (Signed)
ANTICOAGULATION CONSULT NOTE - Initial Consult  Pharmacy Consult for Coumadin Indication: atrial fibrillation  No Known Allergies  Patient Measurements: Height: '5\' 5"'$  (165.1 cm) Weight: 134 lb 11.2 oz (61.1 kg) IBW/kg (Calculated) : 57  Vital Signs: Temp: 97.5 F (36.4 C) (10/24 1518) Temp Source: Oral (10/24 1518) BP: 107/56 mmHg (10/24 1518) Pulse Rate: 58 (10/24 1518)  Labs:  Recent Labs  01/01/15 1125  HGB 8.9*  HCT 28.3*  PLT 319  CREATININE 6.99*    Estimated Creatinine Clearance: 5.8 mL/min (by C-G formula based on Cr of 6.99).   Medical History: Past Medical History  Diagnosis Date  . Hypercholesterolemia     takes Crestor daily  . Thyroid disease     had iodine radiation  . Hypertension     takes Hyzaar daily  . Dysrhythmia     takes Carvedilol daily  . Pneumonia     history of;last time about 4-55yr ago  . GERD (gastroesophageal reflux disease)     takes Protonix daily  . Gastric ulcer   . History of blood transfusion   . History of colon polyps   . Anemia     takes iron pill daily  . Diabetes mellitus without complication ([facility] [place]     takes Tradjenta daily  . History of radiation therapy 07/12/12-08/26/12    right breast/  . Paroxysmal atrial fibrillation ([facility]     PCP EKG 09/27/2013: A. Fibrillation.. EKG 09/29/2013: S. Tach.  . [place] 04/12/12    right-pos lymph node/left-DCIS  . Shortness of breath on exertion   . Bruit of left carotid artery   . Acute upper GI bleeding 01/24/2012  . MGUS (monoclonal gammopathy of unknown significance) 04/21/2013  . Osteoporosis 11/29/2013  . Recurrent right pleural effusion 07/26/2014  . Stage III chronic kidney disease 07/26/2014  . Goiter 07/27/2014  . Substernal goiter 12/23/2012  . Pleural effusion, right 12/23/2012  . Type 2 diabetes mellitus with renal manifestations ([facility] [place] 07/26/2014  . Dysphonia 09/20/2014  . Vocal cord paralysis     Suspected due to massive substernal goiter  . CHF  (congestive heart failure) ([facility])     Medications:  Prescriptions prior to admission  Medication Sig Dispense Refill Last Dose  . acetaminophen (TYLENOL) 500 MG tablet Take 500 mg by mouth 2 (two) times daily as needed for moderate pain or headache.    Past Month at Unknown time  . amiodarone (PACERONE) 200 MG tablet Take 1 tablet (200 mg total) by mouth daily. 60 tablet 0 12/31/2014 at Unknown time  . anastrozole (ARIMIDEX) 1 MG tablet TAKE 1 TABLET (1 MG TOTAL) BY MOUTH DAILY. 30 tablet 5 12/31/2014 at Unknown time  . Cholecalciferol (VITAMIN D) 2000 UNITS CAPS Take by mouth.   12/31/2014 at Unknown time  . feeding supplement, ENSURE ENLIVE, (ENSURE ENLIVE) LIQD Take 237 mLs by mouth 2 (two) times daily between meals. 237 mL 12 Past Week at Unknown time  . ferrous sulfate 325 (65 FE) MG tablet Take 1 tablet (325 mg total) by mouth 3 (three) times daily with meals. 90 tablet 0 12/31/2014 at Unknown time  . linagliptin (TRADJENTA) 5 MG TABS tablet Take 5 mg by mouth daily.   12/31/2014 at Unknown time  . metoprolol tartrate (LOPRESSOR) 25 MG tablet Take 0.5 tablets (12.5 mg total) by mouth 2 (two) times daily. 60 tablet 1 12/31/2014 at 0800  . Multiple Vitamin (MULTIVITAMIN WITH MINERALS) TABS Take 1 tablet by mouth every other day.  12/31/2014 at Unknown time  . thiamine 100 MG tablet Take 1 tablet (100 mg total) by mouth daily. 90 tablet 0 12/31/2014 at Unknown time  . torsemide (DEMADEX) 20 MG tablet Take 20 mg by mouth daily.   12/31/2014 at Unknown time  . warfarin (COUMADIN) 2 MG tablet Take 2 mg by mouth daily at 6 PM. OR AS DIRECTED  0 12/31/2014 at 2100  . furosemide (LASIX) 40 MG tablet Take 1 tablet (40 mg total) by mouth every morning. (Patient not taking: Reported on 01/01/2015) 30 tablet 1 Not Taking at Unknown time  . VENTOLIN HFA 108 (90 BASE) MCG/ACT inhaler Inhale 2 puffs into the lungs every 4 (four) hours as needed for wheezing or shortness of breath.   0 unknown     Assessment: SOB and B LE edema  81 yo AAF with known PMH including thyroid goiter, right breast cancer (in remission), hyperlipidemia, atrial fibrillation on chronic anticoagulation, HTN, DM, and recurrent pleural effusions with several thoracenteses (PleurX Catheter placed 09/27/2014), FTT, PCM, CHF, CKD 4.   She is being admitted with c/o SOB, LE edema with elevated K=5.9. Scr 6.99 (baseline around 2), BNP 2364, Troponin 0.22, wBC 8.9  Anticoagulation: Patientis on chronic anticoagulation for afib for admit INR 5.88 on Coumadin '2mg'$  daily.  Cardiovascular: HTN, HLD, Afib, CHF Endocrinology: DM Gastrointestinal / Nutrition: h/o GERD/ulcer/ GIB Nephrology:  CKD 4, baseline Scr 2. Pulmonary: recurrent pleural effusions Hematology / Oncology: h/o breast cancer. 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 Anemia with WBC 8.9 PTA Medication Issues: metoprolol, Vit D, Tradjenta, MV, Demadex Best Practices: Coumadin,    Goal of Therapy:  INR 2-3 Monitor platelets by anticoagulation protocol: Yes   Plan:  Consider PPI with h/o GERD/ulcer/GIB Hold Coumadin. Daily INR   Ameenah Prosser S. Alford Highland, PharmD, BCPS Clinical Staff Pharmacist Pager [phone]  [place], [place] 01/01/2015,4:07 PM

## 2015-01-01 NOTE — Consult Note (Signed)
Advanced Heart Failure Team Consult Note  Referring Physician: [name]  Primary Physician: Dr Baird Cancer  Primary Cardiologist:  Dr Nadyne Coombes  Reason for Consultation: Heart Failure   HPI:   Alison Gross is an 81 year old with a history of HTN, DM, breast cancer S/P bilateral mastectomies, goiter, PAF, CKD (baseline creatinine~2.5)  recurrent pleural effusion with pleurex catheter on R, DVT R axillary/subclavian veins admitted with worsening renal function.   Over the last 2-3 weeks she reports about 20 pound weight gain. 110-130 pounds.  Today she presented to [facility] ED with increased dyspnea on exertion as well as increased lower extremity edema. Recently switched from lasix to torsemide by Dr Nadyne Coombes with poor urine output reported. Has R pleurex catheter maanged by Dr Servando Snare with ~ 500 cc out daily. Labs obtained and showed elevated potassium 5.9 , creatinine 6.99, and bnp 2364.  Given 500 cc NS in the ED. CXR with enlarged R lobe thyroid, small pleural effusions, and atelectasis but these are slightly less than previous.   Nephrology consulted with recommendations to hold diuretics and given kayexalate. Renal US ordered.    Review of Systems: [y] = yes, '[ ]'$  = no   General: Weight gain [ y]; Weight loss '[ ]'$ ; Anorexia '[ ]'$ ; Fatigue [Y ]; Fever '[ ]'$ ; Chills '[ ]'$ ; Weakness '[ ]'$   Cardiac: Chest pain/pressure '[ ]'$ ; Resting SOB '[ ]'$ ; Exertional SOB [Y ]; Orthopnea Jazmín.Cullens ]; Pedal Edema Blue.Reese ]; Palpitations '[ ]'$ ; Syncope '[ ]'$ ; Presyncope '[ ]'$ ; Paroxysmal nocturnal dyspnea'[ ]'$   Pulmonary: Cough '[ ]'$ ; Wheezing'[ ]'$ ; Hemoptysis'[ ]'$ ; Sputum '[ ]'$ ; Snoring '[ ]'$   GI: Vomiting'[ ]'$ ; Dysphagia'[ ]'$ ; Melena'[ ]'$ ; Hematochezia '[ ]'$ ; Heartburn'[ ]'$ ; Abdominal pain '[ ]'$ ; Constipation '[ ]'$ ; Diarrhea '[ ]'$ ; BRBPR '[ ]'$   GU: Hematuria'[ ]'$ ; Dysuria '[ ]'$ ; Nocturia'[ ]'$   Vascular: Pain in legs with walking '[ ]'$ ; Pain in feet with lying flat '[ ]'$ ; Non-healing sores '[ ]'$ ; Stroke '[ ]'$ ; TIA '[ ]'$ ; Slurred speech '[ ]'$ ;  Neuro: Headaches'[ ]'$ ; Vertigo'[ ]'$ ; Seizures'[ ]'$ ;  Paresthesias'[ ]'$ ;Blurred vision '[ ]'$ ; Diplopia '[ ]'$ ; Vision changes '[ ]'$   Ortho/Skin: Arthritis Blue.Reese ]; Joint pain Blue.Reese ]; Muscle pain '[ ]'$ ; Joint swelling '[ ]'$ ; Back Pain '[ ]'$ ; Rash '[ ]'$   Psych: Depression'[ ]'$ ; Anxiety'[ ]'$   Heme: Bleeding problems '[ ]'$ ; Clotting disorders '[ ]'$ ; Anemia '[ ]'$   Endocrine: Diabetes [Y]; Thyroid dysfunction'[ ]'$   Home Medications Prior to Admission medications   Medication Sig Start Date End Date Taking? Authorizing Provider  acetaminophen (TYLENOL) 500 MG tablet Take 500 mg by mouth 2 (two) times daily as needed for moderate pain or headache.    Yes Historical Provider, MD  amiodarone (PACERONE) 200 MG tablet Take 1 tablet (200 mg total) by mouth daily. 11/04/14  Yes Adrian Prows, MD  anastrozole (ARIMIDEX) 1 MG tablet TAKE 1 TABLET (1 MG TOTAL) BY MOUTH DAILY. 10/17/14  Yes Laurie Panda, NP  Cholecalciferol (VITAMIN D) 2000 UNITS CAPS Take by mouth.   Yes Historical Provider, MD  feeding supplement, ENSURE ENLIVE, (ENSURE ENLIVE) LIQD Take 237 mLs by mouth 2 (two) times daily between meals. 07/28/14  Yes Venetia Maxon Rama, MD  ferrous sulfate 325 (65 FE) MG tablet Take 1 tablet (325 mg total) by mouth 3 (three) times daily with meals. 08/25/14  Yes Janece Canterbury, MD  linagliptin (TRADJENTA) 5 MG TABS tablet Take 5 mg by mouth daily.   Yes Historical Provider, MD  metoprolol tartrate (  LOPRESSOR) 25 MG tablet Take 0.5 tablets (12.5 mg total) by mouth 2 (two) times daily. 11/08/14  Yes Adrian Prows, MD  Multiple Vitamin (MULTIVITAMIN WITH MINERALS) TABS Take 1 tablet by mouth every other day.    Yes Historical Provider, MD  thiamine 100 MG tablet Take 1 tablet (100 mg total) by mouth daily. 11/04/14  Yes Adrian Prows, MD  torsemide (DEMADEX) 20 MG tablet Take 20 mg by mouth daily. 12/29/14  Yes Historical Provider, MD  warfarin (COUMADIN) 2 MG tablet Take 2 mg by mouth daily at 6 PM. OR AS DIRECTED 10/16/14  Yes Historical Provider, MD  furosemide (LASIX) 40 MG tablet Take 1 tablet (40 mg total) by  mouth every morning. Patient not taking: Reported on 01/01/2015 11/08/14   Adrian Prows, MD  VENTOLIN HFA 108 (90 BASE) MCG/ACT inhaler Inhale 2 puffs into the lungs every 4 (four) hours as needed for wheezing or shortness of breath.  10/05/14   Historical Provider, MD    Past Medical History: Past Medical History  Diagnosis Date  . Hypercholesterolemia     takes Crestor daily  . Thyroid disease     had iodine radiation  . Hypertension     takes Hyzaar daily  . Dysrhythmia     takes Carvedilol daily  . Pneumonia     history of;last time about 4-35yr ago  . GERD (gastroesophageal reflux disease)     takes Protonix daily  . Gastric ulcer   . History of blood transfusion   . History of colon polyps   . Anemia     takes iron pill daily  . Diabetes mellitus without complication ([facility]     takes Tradjenta daily  . History of radiation therapy 07/12/12-08/26/12    right breast/  . Paroxysmal atrial fibrillation ([facility]     PCP EKG 09/27/2013: A. Fibrillation.. EKG 09/29/2013: S. Tach.  . Breast cancer ([facility] 04/12/12    right-pos lymph node/left-DCIS  . Shortness of breath on exertion   . Bruit of left carotid artery   . Acute upper GI bleeding 01/24/2012  . MGUS (monoclonal gammopathy of unknown significance) 04/21/2013  . Osteoporosis 11/29/2013  . Recurrent right pleural effusion 07/26/2014  . Stage III chronic kidney disease 07/26/2014  . Goiter 07/27/2014  . Substernal goiter 12/23/2012  . Pleural effusion, right 12/23/2012  . Type 2 diabetes mellitus with renal manifestations ([place] 07/26/2014  . Dysphonia 09/20/2014  . Vocal cord paralysis     Suspected due to massive substernal goiter  . CHF (congestive heart failure) ([facility] [place]     Past Surgical History: Past Surgical History  Procedure Laterality Date  . Carpal tunnel release Left   . Breast biopsy    . Esophagogastroduodenoscopy  01/24/2012    Procedure: ESOPHAGOGASTRODUODENOSCOPY (EGD);  Surgeon: MJeryl Columbia MD;  [facility];  Service: Endoscopy;  Laterality: N/A;  . Thyroid surgery      "removed goiter"  . Cystectomy      from back of neck  . Knee surgery      right with rods  . Esophagogastroduodenoscopy    . Colonoscopy    . Total mastectomy Bilateral 05/10/2012    Procedure: RIGHT modified mastectomy; LEFT total mastectomy;  Surgeon: CHaywood Lasso MD;  [facility]  Service: General;  Laterality: Bilateral;  . Axillary sentinel node biopsy Right 05/10/2012    Procedure: AXILLARY SENTINEL lymph  NODE BIOPSY;  Surgeon: CHaywood Lasso MD;  Location: [facility]  Service: General;  Laterality: Right;  nuclear medicine injection right side  7:00 am   . Abdominal hysterectomy      fibroids, with bilateral SO  . Chest tube insertion Right 09/27/2014    Procedure: INSERTION OF RIGHT PLEURAL DRAINAGE CATHETER;  Surgeon: [name], MD;  Location[facility] OR;  Service: Thoracic;  Laterality: Right;    Family History: Family History  Problem Relation Age of Onset  . Brain cancer Mother   . Aneurysm Father   . Diabetes Sister   . Diabetes Brother   . Diabetes Sister   . Diabetes Brother   . Heart failure Sister     Social History: Social History   Social History  . Marital Status: Married    Spouse Name: [name]  . Number of Children: 2  . Years of Education: N/A   Social History Main Topics  . Smoking status: Former Smoker -- 0.00 packs/day    Types: Cigarettes    Quit date: 07/26/1971  . Smokeless tobacco: Never Used  . Alcohol Use: No  . Drug Use: No  . Sexual Activity: Not Currently    Birth Control/ Protection: Surgical     Comment: menarche age 70,1st live birth 8, P2, no HRT   Other Topics Concern  . None   Social History Narrative   Married.  Ambulates independently.      Allergies:  No Known Allergies  Objective:    Vital Signs:   Temp:  [97.5 F (36.4 C)-98.3 F (36.8 C)] 97.5 F (36.4 C) (10/24 1518) Pulse Rate:  [56-58] 58 (10/24 1518) Resp:   [15-20] 16 (10/24 1518) BP: (86-107)/(39-89) 107/56 mmHg (10/24 1518) SpO2:  [93 %-100 %] 98 % (10/24 1518) Weight:  [132 lb (59.875 kg)-134 lb 11.2 oz (61.1 kg)] 134 lb 11.2 oz (61.1 kg) (10/24 1518)    Weight change: Filed Weights   01/01/15 1107 01/01/15 1518  Weight: 132 lb (59.875 kg) 134 lb 11.2 oz (61.1 kg)    Intake/Output:  No intake or output data in the 24 hours ending 01/01/15 1600   Physical Exam: General:  Elderly weak appearing. No resp difficulty HEENT: normal Neck: supple. JVP to ear with prominent CV waves . Carotids 2+ bilat; no bruits. No lymphadenopathy or thryomegaly appreciated. Cor: PMI nondisplaced. Regular rate & rhythm. +s4 Lungs: clear Abdomen: soft, nontender, + mildly distended. No hepatosplenomegaly. No bruits or masses. Good bowel sounds. Extremities: no cyanosis, clubbing, rash,   R  And LLE 3+ edema Neuro: alert & orientedx3, cranial nerves grossly intact. moves all 4 extremities w/o difficulty. Affect pleasant  Telemetry: NSR  50s   Labs: Basic Metabolic Panel:  Recent Labs Lab 01/01/15 1125  NA 139  K 5.9*  CL 110  CO2 20*  GLUCOSE 127*  BUN 159*  CREATININE 6.99*  CALCIUM 8.9    Liver Function Tests:  Recent Labs Lab 01/01/15 1125  AST 28  ALT 30  ALKPHOS 93  BILITOT 0.3  PROT 5.5*  ALBUMIN 2.6*   No results for input(s): LIPASE, AMYLASE in the last 168 hours. No results for input(s): AMMONIA in the last 168 hours.  CBC:  Recent Labs Lab 01/01/15 1125  WBC 4.8  HGB 8.9*  HCT 28.3*  MCV 86.3  PLT 319    Cardiac Enzymes: No results for input(s): CKTOTAL, CKMB, CKMBINDEX, TROPONINI in the last 168 hours.  BNP: BNP (last 3 results)  Recent Labs  09/28/14 0238 11/07/14 1207 01/01/15 1125  BNP 947.8* 1275.4* 2364.3*  ProBNP (last 3 results) No results for input(s): PROBNP in the last 8760 hours.   CBG: No results for input(s): GLUCAP in the last 168 hours.  Coagulation Studies: No results for  input(s): LABPROT, INR in the last 72 hours.  Other results: EKG: SR 57 bpm  Imaging: Dg Chest 2 View  01/01/2015  CLINICAL DATA:  Shortness of Breath EXAM: CHEST  2 VIEW COMPARISON:  December 28, 2014 chest radiograph and CT of the neck and upper chest September 21, 2014 FINDINGS: There remain small pleural effusions bilaterally with bibasilar atelectasis. There is a drainage catheter on the left, unchanged in position. No pneumothorax. Heart is enlarged with pulmonary vascular within normal limits. The large right substernal mass, noted previously to represent marked thyroid enlargement, is again noted and stable. No adenopathy appreciable. IMPRESSION: The marked enlargement of the right lobe of the thyroid is again noted. There are small pleural effusions bilaterally with bibasilar atelectasis. In comparison with recent chest radiograph, there is slightly less atelectasis and effusion on the left and right sides. No new opacity. No change in cardiac silhouette. Electronically Signed   By: [name] III M.D.   On: 01/01/2015 12:48      Medications:     Current Medications: . amiodarone  200 mg Oral Daily  . anastrozole  1 mg Oral Daily  . feeding supplement (ENSURE ENLIVE)  237 mL Oral BID BM  . ferrous sulfate  325 mg Oral TID WC  . insulin aspart  0-9 Units Subcutaneous TID WC  . sodium chloride  3 mL Intravenous Q12H  . sodium polystyrene  30 g Oral Once  . thiamine  100 mg Oral Daily     Infusions:      Assessment:   1. A/C CKD  2. Hyperkalemia 3. A/C Diastolic HF 4. Recurrent pleural effusion on R with pleurex cathter in place 5. A fib  6. H/O Breast Cancer S/P bilateral mastectomies 2014  7. Pericardial effusion on echo 09/26/2014  Plan/Discussion:     See below   Length of Stay: [place] NP-C  01/01/2015, 4:00 PM  Advanced Heart Failure Team Pager [phone] (M-F; 7a - 4p)  Please contact [facility] for night-coverage after hours (4p -7a )  and weekends on [email or web address]  Patient seen and examined with Darrick Grinder, NP. We discussed all aspects of the encounter. I agree with the assessment and plan as stated above.   She has significant volume overload in the setting of worsening renal function. She will almost certainly need hemodialysis. Would recommend trial of IV diuresis. Renal is following. I will discuss with them.   Echo from 09/26/14 also reviewed and shows severe LVH and small pericardial effusion rasing concern for possible amyloid (though voltage on ECG is ok so may just be hypertensive in nature). Will recheck SPEP/UPEP (has h/o M-spike). Check f/u Echo.   Bensimhon, Daniel,MD 4:44 PM

## 2015-01-01 NOTE — ED Notes (Signed)
Transported by Corey Harold for shortness of breath and bilateral lower extremity edema.  Alert answering and following commands appropriate.

## 2015-01-01 NOTE — Progress Notes (Signed)
CRITICAL VALUE ALERT  Critical value received:  INR 5.88   Date of notification:  01/01/15  Time of notification:  1627  Critical value read back:Yes.    Nurse who received alert:  Lester Kinsman, RN / Alben Deeds, RN  MD notified (1st page):  Agustina Caroli, PA-C  Time of first page:  1632  MD notified (2nd page):  Time of second page:  Responding MD:  Agustina Caroli, PA-C   Time MD responded:  [phone]

## 2015-01-02 ENCOUNTER — Ambulatory Visit (HOSPITAL_COMMUNITY): Payer: Commercial Managed Care - HMO

## 2015-01-02 DIAGNOSIS — I509 Heart failure, unspecified: Secondary | ICD-10-CM

## 2015-01-02 DIAGNOSIS — I959 Hypotension, unspecified: Secondary | ICD-10-CM

## 2015-01-02 LAB — UIFE/LIGHT CHAINS/TP QN, 24-HR UR
% BETA, Urine: 15.9 %
ALPHA 1 URINE: 2.7 %
Albumin, U: 53.2 %
Alpha 2, Urine: 12.3 %
Free Kappa/Lambda Ratio: 2 — ABNORMAL LOW (ref 2.04–10.37)
Free Lambda Lt Chains,Ur: 41 mg/L — ABNORMAL HIGH (ref 0.24–6.66)
Free Lt Chn Excr Rate: 82.2 mg/L — ABNORMAL HIGH (ref 1.35–24.19)
GAMMA GLOBULIN URINE: 15.9 %
M-SPIKE %, URINE: 9.8 % — AB
Total Protein, Urine: 21.8 mg/dL

## 2015-01-02 LAB — RENAL FUNCTION PANEL
ALBUMIN: 2.2 g/dL — AB (ref 3.5–5.0)
Anion gap: 11 (ref 5–15)
BUN: 159 mg/dL — ABNORMAL HIGH (ref 6–20)
CALCIUM: 8.8 mg/dL — AB (ref 8.9–10.3)
CO2: 20 mmol/L — AB (ref 22–32)
CREATININE: 6.58 mg/dL — AB (ref 0.44–1.00)
Chloride: 110 mmol/L (ref 101–111)
GFR, EST AFRICAN AMERICAN: 6 mL/min — AB (ref >60)
GFR, EST NON AFRICAN AMERICAN: 5 mL/min — AB (ref >60)
Glucose, Bld: 112 mg/dL — ABNORMAL HIGH (ref 65–99)
Phosphorus: 7.1 mg/dL — ABNORMAL HIGH (ref 2.5–4.6)
Potassium: 5.1 mmol/L (ref 3.5–5.1)
SODIUM: 141 mmol/L (ref 135–145)

## 2015-01-02 LAB — IMMUNOFIXATION ELECTROPHORESIS
IGA: 146 mg/dL (ref 64–422)
IGG (IMMUNOGLOBIN G), SERUM: 1192 mg/dL (ref 700–1600)
IgM, Serum: 28 mg/dL (ref 26–217)
TOTAL PROTEIN ELP: 5.4 g/dL — AB (ref 6.0–8.5)

## 2015-01-02 LAB — GLUCOSE, CAPILLARY
GLUCOSE-CAPILLARY: 105 mg/dL — AB (ref 65–99)
Glucose-Capillary: 108 mg/dL — ABNORMAL HIGH (ref 65–99)
Glucose-Capillary: 113 mg/dL — ABNORMAL HIGH (ref 65–99)
Glucose-Capillary: 136 mg/dL — ABNORMAL HIGH (ref 65–99)
Glucose-Capillary: 131 mg/dL — ABNORMAL HIGH (ref 65–99)

## 2015-01-02 LAB — PARATHYROID HORMONE, INTACT (NO CA): PTH: 169 pg/mL — ABNORMAL HIGH (ref 15–65)

## 2015-01-02 LAB — PROTIME-INR
INR: 5.88 — AB (ref 0.00–1.49)
PROTHROMBIN TIME: 50.8 s — AB (ref 11.6–15.2)

## 2015-01-02 LAB — T4, FREE: Free T4: 3.48 ng/dL — ABNORMAL HIGH (ref 0.61–1.12)

## 2015-01-02 LAB — HEPATITIS B SURFACE ANTIGEN: HEP B S AG: NEGATIVE

## 2015-01-02 MED ORDER — FUROSEMIDE 10 MG/ML IJ SOLN
160.0000 mg | Freq: Three times a day (TID) | INTRAVENOUS | Status: DC
  Administered 2015-01-02 – 2015-01-04 (×8): 160 mg via INTRAVENOUS
  Filled 2015-01-02 (×12): qty 16

## 2015-01-02 MED ORDER — DARBEPOETIN ALFA 100 MCG/0.5ML IJ SOSY
100.0000 ug | PREFILLED_SYRINGE | INTRAMUSCULAR | Status: DC
  Administered 2015-01-02: 100 ug via SUBCUTANEOUS
  Filled 2015-01-02: qty 0.5

## 2015-01-02 MED ORDER — SODIUM CHLORIDE 0.9 % IV SOLN
510.0000 mg | INTRAVENOUS | Status: AC
  Administered 2015-01-02 – 2015-01-09 (×2): 510 mg via INTRAVENOUS
  Filled 2015-01-02 (×3): qty 17

## 2015-01-02 NOTE — Progress Notes (Signed)
Patient's daughter drained PleurX drain on right side of chest. Per patient output was 730m.

## 2015-01-02 NOTE — Progress Notes (Signed)
S: No N/V.  Says she would do HD if needed O:BP 97/42 mmHg  Pulse 64  Temp(Src) 98.5 F (36.9 C) (Oral)  Resp 17  Ht '5\' 5"'$  (1.651 m)  Wt 60.8 kg (134 lb 0.6 oz)  BMI 22.31 kg/m2  SpO2 100%  Intake/Output Summary (Last 24 hours) at 01/02/15 0815 Last data filed at 01/01/15 2248  Gross per 24 hour  Intake    300 ml  Output    400 ml  Net   -100 ml   Weight change:  TDD:UKGUR and alert CVS:RRR 2/6 systolic M Resp:Decreased BS bases.  Rt pleurex tube Abd:+ BS NTND Ext: 3+ edema LE NEURO:CNI Ox3 no asterixis   . amiodarone  200 mg Oral Daily  . anastrozole  1 mg Oral Daily  . feeding supplement (ENSURE ENLIVE)  237 mL Oral BID BM  . ferrous sulfate  325 mg Oral TID WC  . insulin aspart  0-9 Units Subcutaneous TID WC  . sodium chloride  3 mL Intravenous Q12H  . thiamine  100 mg Oral Daily   Dg Chest 2 View  01/01/2015  CLINICAL DATA:  Shortness of Breath EXAM: CHEST  2 VIEW COMPARISON:  December 28, 2014 chest radiograph and CT of the neck and upper chest September 21, 2014 FINDINGS: There remain small pleural effusions bilaterally with bibasilar atelectasis. There is a drainage catheter on the left, unchanged in position. No pneumothorax. Heart is enlarged with pulmonary vascular within normal limits. The large right substernal mass, noted previously to represent marked thyroid enlargement, is again noted and stable. No adenopathy appreciable. IMPRESSION: The marked enlargement of the right lobe of the thyroid is again noted. There are small pleural effusions bilaterally with bibasilar atelectasis. In comparison with recent chest radiograph, there is slightly less atelectasis and effusion on the left and right sides. No new opacity. No change in cardiac silhouette. Electronically Signed   By: [name] III M.D.   On: 01/01/2015 12:48   US Renal  01/01/2015  CLINICAL DATA:  Acute renal failure.  Diabetes. EXAM: RENAL / URINARY TRACT ULTRASOUND COMPLETE COMPARISON:  None.  FINDINGS: Right Kidney: Length: 9.1 cm.  Diffusely echogenic.  No hydronephrosis. Left Kidney: Length: 9.4 cm. Diffusely echogenic. No hydronephrosis. Several tiny cysts. Bladder: Appears normal for degree of bladder distention. A small amount of free peritoneal fluid is noted. IMPRESSION: 1. Diffusely echogenic kidneys, compatible with medical renal disease. 2. No hydronephrosis. 3. Small amount of ascites. Electronically Signed   By: Claudie Revering M.D.   On: 01/01/2015 16:25   BMET    Component Value Date/Time   NA 141 01/02/2015 0327   NA 143 09/18/2014 0946   K 5.1 01/02/2015 0327   K 4.0 09/18/2014 0946   CL 110 01/02/2015 0327   CL 107 08/05/2012 1009   CO2 20* 01/02/2015 0327   CO2 30* 09/18/2014 0946   GLUCOSE 112* 01/02/2015 0327   GLUCOSE 94 09/18/2014 0946   GLUCOSE 128* 08/05/2012 1009   BUN 159* 01/02/2015 0327   BUN 31.2* 09/18/2014 0946   CREATININE 6.58* 01/02/2015 0327   CREATININE 1.7* 09/18/2014 0946   CALCIUM 8.8* 01/02/2015 0327   CALCIUM 11.8* 09/18/2014 0946   CALCIUM 10.7* 07/27/2014 0830   GFRNONAA 5* 01/02/2015 0327   GFRAA 6* 01/02/2015 0327   CBC    Component Value Date/Time   WBC 4.8 01/01/2015 1125   WBC 4.5 09/18/2014 0946   RBC 3.28* 01/01/2015 1125   RBC 3.34* 09/18/2014 0946  RBC 2.80* 01/24/2012 1011   HGB 8.9* 01/01/2015 1125   HGB 9.6* 09/18/2014 0946   HCT 28.3* 01/01/2015 1125   HCT 29.4* 09/18/2014 0946   PLT 319 01/01/2015 1125   PLT 319 09/18/2014 0946   MCV 86.3 01/01/2015 1125   MCV 87.9 09/18/2014 0946   MCH 27.1 01/01/2015 1125   MCH 28.7 09/18/2014 0946   MCHC 31.4 01/01/2015 1125   MCHC 32.7 09/18/2014 0946   RDW 17.3* 01/01/2015 1125   RDW 16.8* 09/18/2014 0946   LYMPHSABS 0.5* 11/07/2014 1206   LYMPHSABS 0.6* 09/18/2014 0946   MONOABS 0.9 11/07/2014 1206   MONOABS 0.6 09/18/2014 0946   EOSABS 0.1 11/07/2014 1206   EOSABS 0.3 09/18/2014 0946   BASOSABS 0.0 11/07/2014 1206   BASOSABS 0.1 09/18/2014 0946      Assessment:  1. Acute on CKD 4 that I suspect is related to cardiorenal syndrome due to diastolic dysfunction +/- Rt heart failure.  I would expect to see proteinuria if amyloid was playing a role 2. Hyperkalemia, improved 3. Hx M-spike thought to be MGUS, immunofixation P 4. Anemia give IV iron and aranesp  Plan: 1.  Start diuretics 2. Not sure she will be able to avoid HD.  Will show her dialysis videos and if UO does not improve in next 1-2d, will start HD.  INR will need to be lowered for PC to be placed. 3. Vein map 4. Renal diet 5. Daily labs 6. IV iron instead of PO 7. Start aranesp   Jolan Mealor T

## 2015-01-02 NOTE — Consult Note (Signed)
CARDIOLOGY CONSULT NOTE  Patient ID: Alison Gross MRN: [id number] DOB/AGE: 1932-07-10 81 y.o.  Admit date: 01/01/2015 Referring Physician: Internal Medicine Primary Physician:  Maximino Greenland, MD Reason for Consultation: SOB, lower extremity edema  HPI: Alison Gross  is a 81 y.o. female with hypertension, diabetes mellitus, history of breast cancer s/p bilateral mastectomy in the remote past, and retrosternal goiter with recurrent pleural effusion and continuous drainage from Pleurx drain in place with home health nurse coming out to house daily to remove collected fluid, followed by Dr. Ceasar Mons, felt not a candidate for extensive mediastinal explorative surgery.  She also has chronic renal insufficiency, chronic diastolic heart failure, and paroxysmal atrial fibrillation detected during normal physical examination on 09/07/2013. She is on Amiodarone due to recurrent PAF and warfarin for anticoagulation due to renal function.   She was last seen in the office on 12/29/2014 for increasing leg edema and SOB. She had already been started on continuous supplemental O2 and furosemide was changed to torsemide at this visit. Due to declining renal function and poor urine output, hemodialysis had been discussed and she was scheduled for follow up with nephrology this week. She presented to the ED on 01/01/2015 with significantly worsening lower extremity edema and weight gain despite taking torsemide. She continues to report very little urine output.    Past Medical History  Diagnosis Date  . Hypercholesterolemia     takes Crestor daily  . Thyroid disease     had iodine radiation  . Hypertension     takes Hyzaar daily  . Dysrhythmia     takes Carvedilol daily  . Pneumonia     history of;last time about 4-83yr ago  . GERD (gastroesophageal reflux disease)     takes Protonix daily  . Gastric ulcer   . History of blood transfusion   . History of colon polyps   . Anemia     takes iron  pill daily  . Diabetes mellitus without complication ([facility]     takes Tradjenta daily  . History of radiation therapy 07/12/12-08/26/12    right breast/  . Paroxysmal atrial fibrillation ([facility]     PCP EKG 09/27/2013: A. Fibrillation.. EKG 09/29/2013: S. Tach.  . [place] 04/[place]12    right-pos lymph node/left-DCIS  . Shortness of breath on exertion   . Bruit of left carotid artery   . Acute upper GI bleeding 01/24/2012  . MGUS (monoclonal gammopathy of unknown significance) 04/21/2013  . Osteoporosis 11/29/2013  . Recurrent right pleural effusion 07/26/2014  . Stage III chronic kidney disease 07/26/2014  . Goiter 07/27/2014  . Substernal goiter 12/23/2012  . Pleural effusion, right 12/23/2012  . Type 2 diabetes mellitus with renal manifestations ([facility] 07/26/2014  . Dysphonia 09/20/2014  . Vocal cord paralysis     Suspected due to massive substernal goiter  . CHF (congestive heart failure) ([facility]      Past Surgical History  Procedure Laterality Date  . Carpal tunnel release Left   . Breast biopsy    . Esophagogastroduodenoscopy  01/24/2012    Procedure: ESOPHAGOGASTRODUODENOSCOPY (EGD);  Surgeon: MJeryl Columbia MD;  [facility];  Service: Endoscopy;  Laterality: N/A;  . Thyroid surgery      "removed goiter"  . Cystectomy      from back of neck  . Knee surgery      right with rods  . Esophagogastroduodenoscopy    . Colonoscopy    . Total mastectomy Bilateral 05/10/2012  Procedure: RIGHT modified mastectomy; LEFT total mastectomy;  Surgeon: [name], MD;  Location: [place];  Service: General;  Laterality: Bilateral;  . Axillary sentinel node biopsy Right 05/10/2012    Procedure: AXILLARY SENTINEL lymph  NODE BIOPSY;  Surgeon: [name], MD;  Location: [place];  Service: General;  Laterality: Right;  nuclear medicine injection right side  7:00 am   . Abdominal hysterectomy      fibroids, with bilateral SO  . Chest tube insertion Right 09/27/2014     Procedure: INSERTION OF RIGHT PLEURAL DRAINAGE CATHETER;  Surgeon: [name], MD;  Location: [place];  Service: Thoracic;  Laterality: Right;     Family History  Problem Relation Age of Onset  . Brain cancer Mother   . Aneurysm Father   . Diabetes Sister   . Diabetes Brother   . Diabetes Sister   . Diabetes Brother   . Heart failure Sister      Social History: Social History   Social History  . Marital Status: Married    Spouse Name: [name]  . Number of Children: 2  . Years of Education: N/A   Occupational History  . Not on file.   Social History Main Topics  . Smoking status: Former Smoker -- 0.00 packs/day    Types: Cigarettes    Quit date: 07/26/1971  . Smokeless tobacco: Never Used  . Alcohol Use: No  . Drug Use: No  . Sexual Activity: Not Currently    Birth Control/ Protection: Surgical     Comment: menarche age 30,1st live birth 15, P2, no HRT   Other Topics Concern  . Not on file   Social History Narrative   Married.  Ambulates independently.       Prescriptions prior to admission  Medication Sig Dispense Refill Last Dose  . acetaminophen (TYLENOL) 500 MG tablet Take 500 mg by mouth 2 (two) times daily as needed for moderate pain or headache.    Past Month at Unknown time  . amiodarone (PACERONE) 200 MG tablet Take 1 tablet (200 mg total) by mouth daily. 60 tablet 0 12/31/2014 at Unknown time  . anastrozole (ARIMIDEX) 1 MG tablet TAKE 1 TABLET (1 MG TOTAL) BY MOUTH DAILY. 30 tablet 5 12/31/2014 at Unknown time  . Cholecalciferol (VITAMIN D) 2000 UNITS CAPS Take by mouth.   12/31/2014 at Unknown time  . feeding supplement, ENSURE ENLIVE, (ENSURE ENLIVE) LIQD Take 237 mLs by mouth 2 (two) times daily between meals. 237 mL 12 Past Week at Unknown time  . ferrous sulfate 325 (65 FE) MG tablet Take 1 tablet (325 mg total) by mouth 3 (three) times daily with meals. 90 tablet 0 12/31/2014 at Unknown time  . linagliptin (TRADJENTA) 5 MG TABS tablet Take 5 mg by  mouth daily.   12/31/2014 at Unknown time  . metoprolol tartrate (LOPRESSOR) 25 MG tablet Take 0.5 tablets (12.5 mg total) by mouth 2 (two) times daily. 60 tablet 1 12/31/2014 at 0800  . Multiple Vitamin (MULTIVITAMIN WITH MINERALS) TABS Take 1 tablet by mouth every other day.    12/31/2014 at Unknown time  . thiamine 100 MG tablet Take 1 tablet (100 mg total) by mouth daily. 90 tablet 0 12/31/2014 at Unknown time  . torsemide (DEMADEX) 20 MG tablet Take 20 mg by mouth daily.   12/31/2014 at Unknown time  . warfarin (COUMADIN) 2 MG tablet Take 2 mg by mouth daily at 6 PM. OR AS DIRECTED  0 12/31/2014 at  2100  . furosemide (LASIX) 40 MG tablet Take 1 tablet (40 mg total) by mouth every morning. (Patient not taking: Reported on 01/01/2015) 30 tablet 1 Not Taking at Unknown time  . VENTOLIN HFA 108 (90 BASE) MCG/ACT inhaler Inhale 2 puffs into the lungs every 4 (four) hours as needed for wheezing or shortness of breath.   0 unknown     ROS: General: reports fatigue and weight gain; no fevers/chills/night sweats Eyes: no blurry vision, diplopia, or amaurosis ENT: no sore throat or hearing loss Resp: reports shortness of breath; no cough, wheezing, or hemoptysis CV: reports lower extremity edema and mild orthopnea; no chest pain or palpitations GI: no abdominal pain, nausea, vomiting, diarrhea, or constipation GU: reports poor urine output Skin: no rash or skin lesions Neuro: no headache, numbness, tingling, or weakness of extremities Musculoskeletal: no joint pain or swelling Heme: no bleeding, DVT, or easy bruising, labile INRs Endo: no polydipsia or polyuria   Physical Exam: Blood pressure 97/42, pulse 64, temperature 98.5 F (36.9 C), temperature source Oral, resp. rate 17, height '5\' 5"'$  (1.651 m), weight 60.8 kg (134 lb 0.6 oz), SpO2 100 %.   General appearance: alert, cooperative, fatigued and no distress Neck: no carotid bruit and no JVD Lungs: clear to auscultation bilaterally Chest  wall: no tenderness Heart: regular rate and rhythm, S1, S2 normal and S4 present Extremities: edema 2-3+ bilateral lower extremities Pulses: 2+ and symmetric Skin: Skin color, texture, turgor normal. No rashes or lesions Neurologic: Grossly normal  Labs:   Lab Results  Component Value Date   WBC 4.8 01/01/2015   HGB 8.9* 01/01/2015   HCT 28.3* 01/01/2015   MCV 86.3 01/01/2015   PLT 319 01/01/2015    Recent Labs Lab 01/01/15 1125 01/02/15 0327  NA 139 141  K 5.9* 5.1  CL 110 110  CO2 20* 20*  BUN 159* 159*  CREATININE 6.99* 6.58*  CALCIUM 8.9 8.8*  PROT 5.5*  --   BILITOT 0.3  --   ALKPHOS 93  --   ALT 30  --   AST 28  --   GLUCOSE 127* 112*    Lipid Panel  No results found for: CHOL, TRIG, HDL, CHOLHDL, VLDL, LDLCALC  BNP (last 3 results)  Recent Labs  09/28/14 0238 11/07/14 1207 01/01/15 1125  BNP 947.8* 1275.4* 2364.3*    ProBNP (last 3 results) No results for input(s): PROBNP in the last 8760 hours.  HEMOGLOBIN A1C Lab Results  Component Value Date   HGBA1C 6.2* 07/27/2014   MPG 131 07/27/2014    Cardiac Panel (last 3 results)  Recent Labs  09/21/14 0530 11/07/14 1627 11/08/14 0318  TROPONINI 0.29* 0.95* 0.92*    Lab Results  Component Value Date   TROPONINI 0.92* 11/08/2014     TSH  Recent Labs  07/27/14 0515 08/23/14 0822 01/01/15 1535  TSH 1.036 1.115 <0.010*    EKG 01/01/2015: Sinus bradycardia at the rate of 57 bpm, 1st degree AV block, left axis deviation, left anterior fascicular block.  Incomplete right and branch block.  Poor R-wave progression, pulmonary disease pattern.  ST and T wave abnormality in lateral leads.  Echo: pending   Radiology: Dg Chest 2 View  01/01/2015  CLINICAL DATA:  Shortness of Breath EXAM: CHEST  2 VIEW COMPARISON:  December 28, 2014 chest radiograph and CT of the neck and upper chest September 21, 2014 FINDINGS: There remain small pleural effusions bilaterally with bibasilar atelectasis. There is  a drainage catheter on  the left, unchanged in position. No pneumothorax. Heart is enlarged with pulmonary vascular within normal limits. The large right substernal mass, noted previously to represent marked thyroid enlargement, is again noted and stable. No adenopathy appreciable. IMPRESSION: The marked enlargement of the right lobe of the thyroid is again noted. There are small pleural effusions bilaterally with bibasilar atelectasis. In comparison with recent chest radiograph, there is slightly less atelectasis and effusion on the left and right sides. No new opacity. No change in cardiac silhouette. Electronically Signed   By: [name] III M.D.   On: 01/01/2015 12:48   US Renal  01/01/2015  CLINICAL DATA:  Acute renal failure.  Diabetes. EXAM: RENAL / URINARY TRACT ULTRASOUND COMPLETE COMPARISON:  None. FINDINGS: Right Kidney: Length: 9.1 cm.  Diffusely echogenic.  No hydronephrosis. Left Kidney: Length: 9.4 cm. Diffusely echogenic. No hydronephrosis. Several tiny cysts. Bladder: Appears normal for degree of bladder distention. A small amount of free peritoneal fluid is noted. IMPRESSION: 1. Diffusely echogenic kidneys, compatible with medical renal disease. 2. No hydronephrosis. 3. Small amount of ascites. Electronically Signed   By: Claudie Revering M.D.   On: 01/01/2015 16:25    Scheduled Meds: . amiodarone  200 mg Oral Daily  . anastrozole  1 mg Oral Daily  . feeding supplement (ENSURE ENLIVE)  237 mL Oral BID BM  . ferrous sulfate  325 mg Oral TID WC  . insulin aspart  0-9 Units Subcutaneous TID WC  . sodium chloride  3 mL Intravenous Q12H  . thiamine  100 mg Oral Daily   Continuous Infusions:  PRN Meds:.sodium chloride, acetaminophen, albuterol, ondansetron (ZOFRAN) IV, sodium chloride  ASSESSMENT AND PLAN:  1. Acute on chronic kidney disease 2. Hyperkalemia 3. Acute on chronic diastolic heart failure 4. Recurrent pleural effusion with PleurX catheter in place 5. Paroxysmal  atrial fibrillation (CHA2DS2-VASc Score is 5 with yearly risk of stroke of 6.7% per year. Bleeding risk is  1%) 6. Supratherapeutic INR  Recommendation: Admitted for fluid volume overload secondary to acute on chronic renal failure and acute on chronic diastolic heart failure.  Poor response to PO diuretics outpatient. Nephrology has recommended attempt at IV diuretics with consideration of hemodialysis if inadequate response.    Rachel Bo, NP-C 01/02/2015, 8:15 AM [facility] [place] Pager: [phone] Office: [phone]

## 2015-01-02 NOTE — Progress Notes (Signed)
CRITICAL VALUE ALERT  Critical value received:  INR 5.73  Date of notification:  01/02/2015   Time of notification:  5:21 AM   Critical value read back:Yes.    Nurse who received alert:  Christell Steinmiller M. Dalbert Batman, RN, BSN  MD notified (1st page):  Baltazar Najjar, NP  Time of first page:  5:21 AM   Responding MD:  Samuella Bruin   Time MD responded:  5:21 AM

## 2015-01-02 NOTE — Evaluation (Signed)
Physical Therapy Evaluation Patient Details Name: Alison Gross MRN: [id number] DOB: [birth year] Today's Date: 01/02/2015   History of Present Illness  Pt is an 81 y/o F with PMH of HTN, DM, breast cancer S/P bilateral mastectomies, goiter, PAF, CKD, recurrent pleural effusion with pleurex catheter on R, DVT R axillary/subclavian veins admitted with worsening renal function.   She presents with increased DOE and LE edema.  Clinical Impression  Pt presents with impairments as indicated below.  Pt will benefit from further skilled acute PT services to maximize independence and safety with functional mobility and to increase activity tolerance.  HHPT recommended upon discharge at this time.    Follow Up Recommendations Home health PT;Supervision - Intermittent    Equipment Recommendations  Other (comment) (TBD)    Recommendations for Other Services       Precautions / Restrictions Precautions Precautions: Fall Restrictions Weight Bearing Restrictions: No      Mobility  Bed Mobility Overal bed mobility: Modified Independent (increased time and bed rails)                Transfers Overall transfer level: Needs assistance Equipment used: None Transfers: Sit to/from Stand Sit to Stand: Min guard (for stability and safety)            Ambulation/Gait Ambulation/Gait assistance: Min guard (somewhat unsteady) Ambulation Distance (Feet): 80 Feet Assistive device: None Gait Pattern/deviations: Step-through pattern;Drifts right/left;Decreased stride length Gait velocity: slower Gait velocity interpretation: Below normal speed for age/gender General Gait Details: BP in sitting 88/34 though pt with no noted symptoms.  Pt furniture walking in the room and grabbing for hand rails in hallway. Able to ambulate without UE support though with decreased stability and dynamic balance.  Pt may benefit from use of AD. Pt O2 sats stable on room air with ambulation though pt with some  dyspnea and wheezing.  Stairs            Wheelchair Mobility    Modified Rankin (Stroke Patients Only)       Balance Overall balance assessment: Needs assistance Sitting-balance support: Feet supported;No upper extremity supported Sitting balance-Leahy Scale: Good     Standing balance support: During functional activity;No upper extremity supported Standing balance-Leahy Scale: Good                               Pertinent Vitals/Pain Pain Assessment: No/denies pain    Home Living Family/patient expects to be discharged to:: Private residence Living Arrangements: Spouse/significant other;Children (husband and daughter) Available Help at Discharge: Family Type of Home: House Home Access: Level entry     Home Layout: Two level;Able to live on main level with bedroom/bathroom (pt says she only goes upstairs if she has to) Home Equipment: None      Prior Function Level of Independence: Independent               Hand Dominance   Dominant Hand: Right    Extremity/Trunk Assessment   Upper Extremity Assessment: Overall WFL for tasks assessed           Lower Extremity Assessment: Overall WFL for tasks assessed         Communication   Communication: No difficulties  Cognition Arousal/Alertness: Awake/alert Behavior During Therapy: WFL for tasks assessed/performed Overall Cognitive Status: Within Functional Limits for tasks assessed  General Comments General comments (skin integrity, edema, etc.): Pt uses 2L O2 at home during the night and as needed throughout the day.  Pt on 2L oxygen prior to start of session but was stable on room air with ambulation, though she did have noted dyspnea/wheezing.  Pt educated on pursed lip breathing technique.    Exercises        Assessment/Plan    PT Assessment Patient needs continued PT services  PT Diagnosis Difficulty walking;Other (comment) (decreased activity  tolerance)   PT Problem List Decreased activity tolerance;Decreased balance;Decreased mobility;Decreased knowledge of use of DME;Decreased safety awareness;Cardiopulmonary status limiting activity  PT Treatment Interventions DME instruction;Gait training;Stair training;Functional mobility training;Therapeutic activities;Therapeutic exercise;Balance training;Patient/family education   PT Goals (Current goals can be found in the Care Plan section) Acute Rehab PT Goals Patient Stated Goal: none stated PT Goal Formulation: With patient Time For Goal Achievement: 01/16/15 Potential to Achieve Goals: Good    Frequency Min 3X/week   Barriers to discharge        Co-evaluation PT/OT/SLP Co-Evaluation/Treatment: Yes Reason for Co-Treatment: Complexity of the patient's impairments (multi-system involvement) PT goals addressed during session: Mobility/safety with mobility         End of Session Equipment Utilized During Treatment: Gait belt;Oxygen Activity Tolerance: Patient tolerated treatment well Patient left: in bed;with call bell/phone within reach;with nursing/sitter in room Nurse Communication: Mobility status         Time: 2919-1660 PT Time Calculation (min) (ACUTE ONLY): 20 min   Charges:   PT Evaluation $Initial PT Evaluation Tier I: 1 Procedure     PT G CodesEstill Bamberg Quana Chamberlain 03-Jan-2015, 10:48 AM  Lorita Officer, SPT

## 2015-01-02 NOTE — Progress Notes (Signed)
ANTICOAGULATION CONSULT NOTE - Follow Up Consult  Pharmacy Consult for Coumadin Indication: atrial fibrillation  No Known Allergies  Patient Measurements: Height: '5\' 5"'$  (165.1 cm) Weight: 134 lb 0.6 oz (60.8 kg) IBW/kg (Calculated) : 57  Vital Signs: Temp: 98.5 F (36.9 C) (10/25 0749) Temp Source: Oral (10/25 0749) BP: 91/45 mmHg (10/25 0800) Pulse Rate: 64 (10/25 0800)  Labs:  Recent Labs  01/01/15 1125 01/01/15 1535 01/02/15 0327  HGB 8.9*  --   --   HCT 28.3*  --   --   PLT 319  --   --   LABPROT  --  50.8* 49.8*  INR  --  5.88* 5.73*  CREATININE 6.99*  --  6.58*    Estimated Creatinine Clearance: 6.1 mL/min (by C-G formula based on Cr of 6.58).  Assessment: 82yof on coumadin pta for afib, admitted with volume overload. INR on admission supratherapeutic at 5.88 and dose held. INR remains above goal today at 5.73. No bleeding reported. Anticipating need for HD in next 1-2 days so may need to reverse INR for catheter placement if it does not fall on its own.   Goal of Therapy:  INR 2-3 Monitor platelets by anticoagulation protocol: Yes   Plan:  1) No coumadin tonight 2) INR in AM  Deboraha Sprang 01/02/2015,10:37 AM

## 2015-01-02 NOTE — Progress Notes (Signed)
COURTESY NOTE:  81 y/o [place] woman with a history of breast cancer in remission and M-GUS. I do not believe either condition is driving her current problems, but will add light chains to current labs. Note that cytology from her Right effusion has been negative x4  Will follow peripherally. Please let me know if we can be of assistance.   SUMMARY:  82y.o. [place] woman   (1) status post bilateral mastectomies 05/10/2012, showing (a) on the left side, low-grade ductal carcinoma in situ; earlier biopsy had shown multiple areas of concern, one of which was invasive ductal carcinoma, grade 1, with abundant mucin, estrogen receptor positive, progesterone receptor negative, with an MIB-1 of 10, and no HER-2 amplification.  (b) on the right side, a pT2 pN1, stage IIB, invasive mucinous/ductal carcinoma, grade 1, estrogen and progesterone receptor positive, HER-2 negative, with an MIB-1 of 32%.  (2) opted against adjuvant chemotherapy  (3) adjuvant radiation, completed 08/26/2012  (4) started tamoxifen in late June 2014, discontinued due to coagulation concerns; started anastrozole 11/29/2013  (a) bone density 09/04/2013 showed osteoporosis (b) started yearly zolendronate 02/20/2014  (5) no reconstruction planned  (6) labs have shown a mildly elevated M-Spike and free lambda light chains, stable, negative bone survey 12/06/2012

## 2015-01-02 NOTE — Progress Notes (Signed)
Echocardiogram 2D Echocardiogram has been performed.  Alison Gross 01/02/2015, 10:50 AM

## 2015-01-02 NOTE — Evaluation (Signed)
Occupational Therapy Evaluation and Discharge Patient Details Name: Alison Gross MRN: [id number] DOB: 1932/08/26 Today's Date: 01/02/2015    History of Present Illness Pt is an 81 y/o F with PMH of HTN, DM, breast cancer S/P bilateral mastectomies, goiter, PAF, CKD, recurrent pleural effusion with pleurex catheter on R, DVT R axillary/subclavian veins admitted with worsening renal function.   She presents with increased DOE and LE edema.   Clinical Impression   This 81 yo female admitted with above presents to acute OT at a minguard A level when up on her feet and will have this at home from family, no further OT needs, we will sign off.    Follow Up Recommendations  No OT follow up    Equipment Recommendations  None recommended by OT       Precautions / Restrictions Precautions Precautions: Fall Restrictions Weight Bearing Restrictions: No      Mobility Bed Mobility Overal bed mobility: Modified Independent (increased time and use of rails)                Transfers Overall transfer level: Needs assistance Equipment used: None Transfers: Sit to/from Stand Sit to Stand: Min guard (for stabilty and safety)              Balance Overall balance assessment: Needs assistance Sitting-balance support: Feet supported;No upper extremity supported Sitting balance-Leahy Scale: Good     Standing balance support: During functional activity;No upper extremity supported Standing balance-Leahy Scale: Good                              ADL Overall ADL's : Needs assistance/impaired Eating/Feeding: Independent;Sitting   Grooming: Set up;Sitting   Upper Body Bathing: Set up;Sitting   Lower Body Bathing: Min guard;Sit to/from stand   Upper Body Dressing : Set up;Sitting   Lower Body Dressing: Min guard;Sit to/from stand   Toilet Transfer: Min guard;Ambulation   Toileting- Clothing Manipulation and Hygiene: Min guard;Sit to/from stand                          Pertinent Vitals/Pain Pain Assessment: No/denies pain     Hand Dominance Right   Extremity/Trunk Assessment Upper Extremity Assessment Upper Extremity Assessment: Overall WFL for tasks assessed   Lower Extremity Assessment Lower Extremity Assessment: Overall WFL for tasks assessed       Communication Communication Communication: No difficulties   Cognition Arousal/Alertness: Awake/alert Behavior During Therapy: WFL for tasks assessed/performed Overall Cognitive Status: Within Functional Limits for tasks assessed                                Home Living Family/patient expects to be discharged to:: Private residence Living Arrangements: Spouse/significant other;Children (husband and daughter) Available Help at Discharge: Family Type of Home: House Home Access: Level entry     Home Layout: Two level;Able to live on main level with bedroom/bathroom (only goes upstairs infrequently) Alternate Level Stairs-Number of Steps: 12   Bathroom Shower/Tub: Walk-in shower;Tub/shower unit   Bathroom Toilet: Standard     Home Equipment: None          Prior Functioning/Environment Level of Independence: Independent             OT Diagnosis: Generalized weakness         OT Goals(Current goals can be found in the care plan section)  Acute Rehab OT Goals Patient Stated Goal: none stated  OT Frequency:             Co-evaluation PT/OT/SLP Co-Evaluation/Treatment: Yes Reason for Co-Treatment: Complexity of the patient's impairments (multi-system involvement) PT goals addressed during session: Mobility/safety with mobility OT goals addressed during session: ADL's and self-care;Strengthening/ROM      End of Session Equipment Utilized During Treatment: Rolling walker;Gait belt Nurse Communication: Mobility status  Activity Tolerance: Patient tolerated treatment well Patient left: in bed;with call bell/phone within reach   Time:  1517-6160 OT Time Calculation (min): 20 min Charges:  OT General Charges $OT Visit: 1 Procedure OT Evaluation $Initial OT Evaluation Tier I: 1 Procedure  Almon Register [phone] 01/02/2015, 11:25 AM

## 2015-01-02 NOTE — Progress Notes (Signed)
Advanced Heart Failure Rounding Note   Subjective:    Admitted with volume overload in the setting of worsening renal a function. No urine output.   Denies SOB.   Renal u/s with small kidneys and significant medico-renal disease   INR 5.73  Creatinine 6.58    Objective:   Weight Range:  Vital Signs:   Temp:  [97.5 F (36.4 C)-98.4 F (36.9 C)] 98.4 F (36.9 C) (10/25 0400) Pulse Rate:  [56-62] 62 (10/25 0400) Resp:  [11-20] 20 (10/25 0400) BP: (86-114)/(39-89) 96/45 mmHg (10/25 0400) SpO2:  [93 %-100 %] 100 % (10/25 0400) Weight:  [132 lb (59.875 kg)-134 lb 11.2 oz (61.1 kg)] 134 lb 0.6 oz (60.8 kg) (10/25 0500)    Weight change: Filed Weights   01/01/15 1107 01/01/15 1518 01/02/15 0500  Weight: 132 lb (59.875 kg) 134 lb 11.2 oz (61.1 kg) 134 lb 0.6 oz (60.8 kg)    Intake/Output:   Intake/Output Summary (Last 24 hours) at 01/02/15 0734 Last data filed at 01/01/15 2248  Gross per 24 hour  Intake    300 ml  Output    400 ml  Net   -100 ml     Physical Exam: General:   No resp difficulty HEENT: normal Neck: supple. JVP to ear . Carotids 2+ bilat; no bruits. No lymphadenopathy or thryomegaly appreciated. Cor: PMI nondisplaced. Regular rate & rhythm. No rubs, gallops or murmurs. Lungs: clear Abdomen: soft, nontender, nondistended. No hepatosplenomegaly. No bruits or masses. Good bowel sounds. Extremities: no cyanosis, clubbing, rash, R and LLE 3+extends thighs.  Neuro: alert & orientedx3, cranial nerves grossly intact. moves all 4 extremities w/o difficulty. Affect pleasant  Telemetry: Sinus Rhythm 60s    Labs: Basic Metabolic Panel:  Recent Labs Lab 01/01/15 1125 01/02/15 0327  NA 139 141  K 5.9* 5.1  CL 110 110  CO2 20* 20*  GLUCOSE 127* 112*  BUN 159* 159*  CREATININE 6.99* 6.58*  CALCIUM 8.9 8.8*  PHOS  --  7.1*    Liver Function Tests:  Recent Labs Lab 01/01/15 1125 01/02/15 0327  AST 28  --   ALT 30  --   ALKPHOS 93  --     BILITOT 0.3  --   PROT 5.5*  --   ALBUMIN 2.6* 2.2*   No results for input(s): LIPASE, AMYLASE in the last 168 hours. No results for input(s): AMMONIA in the last 168 hours.  CBC:  Recent Labs Lab 01/01/15 1125  WBC 4.8  HGB 8.9*  HCT 28.3*  MCV 86.3  PLT 319    Cardiac Enzymes: No results for input(s): CKTOTAL, CKMB, CKMBINDEX, TROPONINI in the last 168 hours.  BNP: BNP (last 3 results)  Recent Labs  09/28/14 0238 11/07/14 1207 01/01/15 1125  BNP 947.8* 1275.4* 2364.3*    ProBNP (last 3 results) No results for input(s): PROBNP in the last 8760 hours.    Other results:  Imaging: Dg Chest 2 View  01/01/2015  CLINICAL DATA:  Shortness of Breath EXAM: CHEST  2 VIEW COMPARISON:  December 28, 2014 chest radiograph and CT of the neck and upper chest September 21, 2014 FINDINGS: There remain small pleural effusions bilaterally with bibasilar atelectasis. There is a drainage catheter on the left, unchanged in position. No pneumothorax. Heart is enlarged with pulmonary vascular within normal limits. The large right substernal mass, noted previously to represent marked thyroid enlargement, is again noted and stable. No adenopathy appreciable. IMPRESSION: The marked enlargement of the right lobe  of the thyroid is again noted. There are small pleural effusions bilaterally with bibasilar atelectasis. In comparison with recent chest radiograph, there is slightly less atelectasis and effusion on the left and right sides. No new opacity. No change in cardiac silhouette. Electronically Signed   By: [name] III M.D.   On: 01/01/2015 12:48   US Renal  01/01/2015  CLINICAL DATA:  Acute renal failure.  Diabetes. EXAM: RENAL / URINARY TRACT ULTRASOUND COMPLETE COMPARISON:  None. FINDINGS: Right Kidney: Length: 9.1 cm.  Diffusely echogenic.  No hydronephrosis. Left Kidney: Length: 9.4 cm. Diffusely echogenic. No hydronephrosis. Several tiny cysts. Bladder: Appears normal for degree of  bladder distention. A small amount of free peritoneal fluid is noted. IMPRESSION: 1. Diffusely echogenic kidneys, compatible with medical renal disease. 2. No hydronephrosis. 3. Small amount of ascites. Electronically Signed   By: Claudie Revering M.D.   On: 01/01/2015 16:25      Medications:     Scheduled Medications: . amiodarone  200 mg Oral Daily  . anastrozole  1 mg Oral Daily  . feeding supplement (ENSURE ENLIVE)  237 mL Oral BID BM  . ferrous sulfate  325 mg Oral TID WC  . insulin aspart  0-9 Units Subcutaneous TID WC  . sodium chloride  3 mL Intravenous Q12H  . thiamine  100 mg Oral Daily     Infusions:     PRN Medications:  sodium chloride, acetaminophen, albuterol, ondansetron (ZOFRAN) IV, sodium chloride   Assessment:   1. A/C CKD  2. Hyperkalemia 3. A/C Diastolic HF 4. Recurrent pleural effusion on R with pleurex cathter in place 5. A fib  6. H/O Breast Cancer S/P bilateral mastectomies 2014  7. Pericardial effusion on echo 09/26/2014    Plan/Discussion:   Marked volume overload. No urine output. Nephrology following for possible HD   ECHO pending.    Length of Stay: 1   Amy Clegg NP-C  01/02/2015, 7:34 AM  Advanced Heart Failure Team Pager [phone] (M-F; 7a - 4p)  Please contact [facility] for night-coverage after hours (4p -7a ) and weekends on [email or web address]  Patient seen and examined with Darrick Grinder, NP. We discussed all aspects of the encounter. I agree with the assessment and plan as stated above.   Suspect major issue is progressive intrinsic renal failure. Note Renal plans for high-dose lasix followed by initiation of HD. Possibility of amyloidosis remains. Will repeat echo. If LV function is down or worsening LVH would consider fat pad biopsy.  Azharia Surratt,MD 10:10 AM

## 2015-01-02 NOTE — Progress Notes (Addendum)
PROGRESS NOTE    Alison Gross RKY:[id number] DOB: 1933/02/11 DOA: 01/01/2015 PCP: Maximino Greenland, MD  Primary Cardiologist: Dr Einar Gip Thoracic Surgeon: Dr. Servando Snare Primary nephrologist: Dr. Edrick Oh  HPI/Brief narrative 81 year old female with history of HTN, DM, chronic diastolic CHF, breast cancer status post bilateral mastectomies, PAF, chronic kidney disease stage 4(baseline creatinine~2), recurrent exudative right pleural effusion s/p right Pleurx catheter, right axillary/subclavian DVT, M spike presented to [facility] ED on 01/01/15 with worsening dyspnea, weight gain, weakness and worsening renal functions. She was seen by her cardiologist on 10/19 and Lasix was changed to [facility]. In the ED, creatinine up from 2.5-6.9, BNP 2364, hypotensive range blood pressures in the 80s-90s, elevated troponin and supratherapeutic INR 5.8. She was hospitalized for management of acute on chronic renal failure, acute on chronic diastolic CHF and hypotension. Advanced CHF team and nephrology are consulting.   Assessment/Plan:  Acute on stage IV chronic kidney disease/hyperkalemia - Baseline creatinine ~2 - Follows with Dr. Edrick Oh, nephrology - Admitted with creatinine of 6.9. - Etiology may be multifactorial: Cardiorenal syndrome due to diastolic dysfunction. - Nephrology consulted: Kayexalate, renal ultrasound-no hydronephrosis readmitted medical renal disease & renal diet. Cardiology discussed with nephrology and started high-dose IV Lasix. - Potassium has improved from 5.9 on admission to 5.1. Creatinine is marginally decreased from 6.99-6.58. - May need dialysis-nephrology discussing with patient  Acute on chronic diastolic CHF - Patient is significantly volume overloaded in the setting of worsening renal function. - Started on high-dose IV Lasix by cardiology. No significant diuresis - Follow up repeat echo: concern for amyloidosis - if LV function down, considering fat pad  Bx.  Recurrent right pleural effusion, s/p Pleurx catheter - Family raining this daily at home. RN to drain daily in the hospital. - cytology from her Right effusion has been negative x4  Paroxysmal atrial fibrillation - Currently in SB 50's - SR in the 60s. - INR supratherapeutic, likely secondary to acute illness. -Continue amiodarone. Metoprolol on hold secondary to hypotension.   History of breast cancer status post bilateral mastectomies 2014 - Oncology input appreciated. In remission.  MGUS - Oncology input appreciated. Do not believe that her breast cancer history or MGUS is driving her current problems. - Added light chains to current labs.  Iron deficiency anemia - Starting Aranesp & IV Feraheme - follow CBC's  Hypotension - Asymptomatic and stable. Holding blood pressure medications.  Uncontrolled DM 2 with renal complications - Holding oral diabetics. Continue NovoLog SSI. Controlled.  Supratherapeutic INR - Admitted with INR of 5.8. Likely elevated from acute illness. Coumadin per pharmacy. No bleeding reported.  Goiter - Abnormal TSH <0.010. May be related to amiodarone. Check free T3 and free T4.    DVT prophylaxis: Patient has supratherapeutic INR from Coumadin at home.  Code Status: Full  Family Communication: None at bedside  Disposition Plan: DC home when medically stable.   Consultants:  Advanced heart failure team  Nephrology  Procedures:  Patient has a right Pleurx catheter from prior to admission-drained daily.  Antibiotics:  None  Subjective: Feels better. Dyspnea improved. No improvement in urine output.  Objective: Filed Vitals:   01/02/15 0749 01/02/15 0800 01/02/15 1000 01/02/15 1200  BP: '97/42 91/45 94/46 '$ 93/36  Pulse: 64 64 35 64  Temp: 98.5 F (36.9 C)     TempSrc: Oral     Resp: '17 16 29 20  '$ Height:      Weight:      SpO2: 100% 100% 80% 98%  Intake/Output Summary (Last 24 hours) at 01/02/15 1404 Last data filed  at 01/02/15 1114  Gross per 24 hour  Intake    369 ml  Output   1100 ml  Net   -731 ml   Filed Weights   01/01/15 1107 01/01/15 1518 01/02/15 0500  Weight: 59.875 kg (132 lb) 61.1 kg (134 lb 11.2 oz) 60.8 kg (134 lb 0.6 oz)     Exam:  General exam: Elderly frail female patient sitting up comfortably in bed without distress.  Respiratory system: Reduced breath sounds in the bases with scattered occasional bibasal crackles. Rest of lung fields clear to auscultation. No increased work of breathing. Cardiovascular system: S1 & S2 heard, RRR. No JVD, murmurs, gallops, clicks or pedal edema.Telemetry: SB 50s-SR 60s  Gastrointestinal system: Abdomen is nondistended, soft and nontender. Normal bowel sounds heard. Central nervous system: Alert and oriented. No focal neurological deficits. Extremities: Symmetric 5 x 5 power.   Data Reviewed: Basic Metabolic Panel:  Recent Labs Lab 01/01/15 1125 01/02/15 0327  NA 139 141  K 5.9* 5.1  CL 110 110  CO2 20* 20*  GLUCOSE 127* 112*  BUN 159* 159*  CREATININE 6.99* 6.58*  CALCIUM 8.9 8.8*  PHOS  --  7.1*   Liver Function Tests:  Recent Labs Lab 01/01/15 1125 01/02/15 0327  AST 28  --   ALT 30  --   ALKPHOS 93  --   BILITOT 0.3  --   PROT 5.5*  --   ALBUMIN 2.6* 2.2*   No results for input(s): LIPASE, AMYLASE in the last 168 hours. No results for input(s): AMMONIA in the last 168 hours. CBC:  Recent Labs Lab 01/01/15 1125  WBC 4.8  HGB 8.9*  HCT 28.3*  MCV 86.3  PLT 319   Cardiac Enzymes: No results for input(s): CKTOTAL, CKMB, CKMBINDEX, TROPONINI in the last 168 hours. BNP (last 3 results) No results for input(s): PROBNP in the last 8760 hours. CBG:  Recent Labs Lab 01/01/15 1750 01/01/15 2146 01/02/15 0748  GLUCAP 107* 108* 113*    Recent Results (from the past 240 hour(s))  MRSA PCR Screening     Status: None   Collection Time: 01/01/15  3:10 PM  Result Value Ref Range Status   MRSA by PCR NEGATIVE  NEGATIVE Final    Comment:        The GeneXpert MRSA Assay (FDA approved for NASAL specimens only), is one component of a comprehensive MRSA colonization surveillance program. It is not intended to diagnose MRSA infection nor to guide or monitor treatment for MRSA infections.   Urine culture     Status: None (Preliminary result)   Collection Time: 01/01/15  4:46 PM  Result Value Ref Range Status   Specimen Description URINE, CLEAN CATCH  Final   Special Requests NONE  Final   Culture NO GROWTH < 24 HOURS  Final   Report Status PENDING  Incomplete          Studies: Dg Chest 2 View  01/01/2015  CLINICAL DATA:  Shortness of Breath EXAM: CHEST  2 VIEW COMPARISON:  December 28, 2014 chest radiograph and CT of the neck and upper chest September 21, 2014 FINDINGS: There remain small pleural effusions bilaterally with bibasilar atelectasis. There is a drainage catheter on the left, unchanged in position. No pneumothorax. Heart is enlarged with pulmonary vascular within normal limits. The large right substernal mass, noted previously to represent marked thyroid enlargement, is again noted and stable. No adenopathy  appreciable. IMPRESSION: The marked enlargement of the right lobe of the thyroid is again noted. There are small pleural effusions bilaterally with bibasilar atelectasis. In comparison with recent chest radiograph, there is slightly less atelectasis and effusion on the left and right sides. No new opacity. No change in cardiac silhouette. Electronically Signed   By: [name] III M.D.   On: 01/01/2015 12:48   US Renal  01/01/2015  CLINICAL DATA:  Acute renal failure.  Diabetes. EXAM: RENAL / URINARY TRACT ULTRASOUND COMPLETE COMPARISON:  None. FINDINGS: Right Kidney: Length: 9.1 cm.  Diffusely echogenic.  No hydronephrosis. Left Kidney: Length: 9.4 cm. Diffusely echogenic. No hydronephrosis. Several tiny cysts. Bladder: Appears normal for degree of bladder distention. A small  amount of free peritoneal fluid is noted. IMPRESSION: 1. Diffusely echogenic kidneys, compatible with medical renal disease. 2. No hydronephrosis. 3. Small amount of ascites. Electronically Signed   By: Claudie Revering M.D.   On: 01/01/2015 16:25        Scheduled Meds: . amiodarone  200 mg Oral Daily  . anastrozole  1 mg Oral Daily  . darbepoetin (ARANESP) injection - NON-DIALYSIS  100 mcg Subcutaneous Q Tue-1800  . feeding supplement (ENSURE ENLIVE)  237 mL Oral BID BM  . ferumoxytol  510 mg Intravenous Weekly  . furosemide  160 mg Intravenous 3 times per day  . insulin aspart  0-9 Units Subcutaneous TID WC  . sodium chloride  3 mL Intravenous Q12H  . thiamine  100 mg Oral Daily   Continuous Infusions:   Principal Problem:   Acute on chronic renal failure (HCC) Active Problems:   MGUS (monoclonal gammopathy of unknown significance)   Ductal carcinoma in situ (DCIS) of left breast   Acute on chronic diastolic heart failure (HCC)   Type 2 diabetes mellitus with renal manifestations (HCC)   Hypotension   Substernal goiter   PAF (paroxysmal atrial fibrillation) (HCC)   Diastolic CHF, acute on chronic (HCC)   Stage 4 chronic kidney disease due to arterionephrosclerosis (HCC)   Pleural effusion exudative   Supratherapeutic INR    Time spent: 50 minutes    Alison Mullally, MD, FACP, FHM. [facility] Pager [phone]  If 7PM-7AM, please contact night-coverage [email or web address] Password [email or web address] 01/02/2015, 2:04 PM    LOS: 1 day

## 2015-01-03 ENCOUNTER — Encounter: Payer: Self-pay | Admitting: *Deleted

## 2015-01-03 ENCOUNTER — Encounter (HOSPITAL_COMMUNITY): Payer: Self-pay | Admitting: Radiology

## 2015-01-03 ENCOUNTER — Inpatient Hospital Stay (HOSPITAL_COMMUNITY): Payer: Commercial Managed Care - HMO

## 2015-01-03 DIAGNOSIS — N189 Chronic kidney disease, unspecified: Secondary | ICD-10-CM

## 2015-01-03 DIAGNOSIS — N179 Acute kidney failure, unspecified: Secondary | ICD-10-CM | POA: Insufficient documentation

## 2015-01-03 LAB — RENAL FUNCTION PANEL
ALBUMIN: 2.4 g/dL — AB (ref 3.5–5.0)
Anion gap: 9 (ref 5–15)
BUN: 159 mg/dL — AB (ref 6–20)
CHLORIDE: 104 mmol/L (ref 101–111)
CO2: 21 mmol/L — ABNORMAL LOW (ref 22–32)
Calcium: 8.5 mg/dL — ABNORMAL LOW (ref 8.9–10.3)
Creatinine, Ser: 6.13 mg/dL — ABNORMAL HIGH (ref 0.44–1.00)
GFR calc Af Amer: 7 mL/min — ABNORMAL LOW (ref >60)
GFR calc non Af Amer: 6 mL/min — ABNORMAL LOW (ref >60)
GLUCOSE: 108 mg/dL — AB (ref 65–99)
PHOSPHORUS: 7.5 mg/dL — AB (ref 2.5–4.6)
POTASSIUM: 4.7 mmol/L (ref 3.5–5.1)
Sodium: 134 mmol/L — ABNORMAL LOW (ref 135–145)

## 2015-01-03 LAB — PROTEIN ELECTROPHORESIS, SERUM
A/G RATIO SPE: 0.9 (ref 0.7–1.7)
ALBUMIN ELP: 2.4 g/dL — AB (ref 2.9–4.4)
ALPHA-1-GLOBULIN: 0.3 g/dL (ref 0.0–0.4)
ALPHA-2-GLOBULIN: 0.6 g/dL (ref 0.4–1.0)
Beta Globulin: 0.8 g/dL (ref 0.7–1.3)
GLOBULIN, TOTAL: 2.8 g/dL (ref 2.2–3.9)
Gamma Globulin: 1.1 g/dL (ref 0.4–1.8)
M-SPIKE, %: 0.6 g/dL — AB
Total Protein ELP: 5.2 g/dL — ABNORMAL LOW (ref 6.0–8.5)

## 2015-01-03 LAB — URINE CULTURE

## 2015-01-03 LAB — KAPPA/LAMBDA LIGHT CHAINS
KAPPA, LAMDA LIGHT CHAIN RATIO: 0.21 — AB (ref 0.26–1.65)
Kappa free light chain: 62.86 mg/L — ABNORMAL HIGH (ref 3.30–19.40)
LAMDA FREE LIGHT CHAINS: 296.39 mg/L — AB (ref 5.71–26.30)

## 2015-01-03 LAB — CBC
HEMATOCRIT: 26.3 % — AB (ref 36.0–46.0)
Hemoglobin: 8.4 g/dL — ABNORMAL LOW (ref 12.0–15.0)
MCH: 27.6 pg (ref 26.0–34.0)
MCHC: 31.9 g/dL (ref 30.0–36.0)
MCV: 86.5 fL (ref 78.0–100.0)
Platelets: 287 10*3/uL (ref 150–400)
RBC: 3.04 MIL/uL — ABNORMAL LOW (ref 3.87–5.11)
RDW: 17 % — AB (ref 11.5–15.5)
WBC: 5 10*3/uL (ref 4.0–10.5)

## 2015-01-03 LAB — PROTIME-INR
INR: 5.73 — AB (ref 0.00–1.49)
INR: 5.15 (ref 0.00–1.49)
PROTHROMBIN TIME: 45.9 s — AB (ref 11.6–15.2)
Prothrombin Time: 49.8 seconds — ABNORMAL HIGH (ref 11.6–15.2)

## 2015-01-03 LAB — GLUCOSE, CAPILLARY
Glucose-Capillary: 84 mg/dL (ref 65–99)
Glucose-Capillary: 107 mg/dL — ABNORMAL HIGH (ref 65–99)
Glucose-Capillary: 141 mg/dL — ABNORMAL HIGH (ref 65–99)
Glucose-Capillary: 128 mg/dL — ABNORMAL HIGH (ref 65–99)

## 2015-01-03 LAB — T3, FREE: T3 FREE: 2.3 pg/mL (ref 2.0–4.4)

## 2015-01-03 MED ORDER — PHYTONADIONE 5 MG PO TABS
5.0000 mg | ORAL_TABLET | Freq: Once | ORAL | Status: AC
  Administered 2015-01-03: 5 mg via ORAL
  Filled 2015-01-03: qty 1

## 2015-01-03 MED ORDER — VITAMIN K1 10 MG/ML IJ SOLN
10.0000 mg | INTRAVENOUS | Status: AC
  Administered 2015-01-03: 10 mg via INTRAVENOUS
  Filled 2015-01-03: qty 1

## 2015-01-03 MED ORDER — METHIMAZOLE 10 MG PO TABS
20.0000 mg | ORAL_TABLET | Freq: Every day | ORAL | Status: DC
  Administered 2015-01-03 – 2015-01-10 (×8): 20 mg via ORAL
  Filled 2015-01-03 (×8): qty 2

## 2015-01-03 MED ORDER — WARFARIN - PHARMACIST DOSING INPATIENT: Freq: Every day | Status: DC

## 2015-01-03 NOTE — Progress Notes (Signed)
Right  Upper Extremity Vein Map   Cephalic  Segment Diameter Depth Comment  1. Axilla 76m mm   2. Mid upper arm 0.925mmm   3. Above AC 0.794mm   4. In AC 1.25m12m   5. Below AC 1.37mm20m  6. Mid forearm 1.17mm 58m 7. Wrist mm mm    mm mm    mm mm    mm mm    Basilic  Segment Diameter Depth Comment  1. Axilla mm mm   2. Mid upper arm 1.16mm 122mm   337mbove AC 1.12mm 12.20m  4. 33mAC 2.17mm [facility]20m25m5. B44m AC mm mm   6. Mid forearm mm mm   7. Wrist mm mm    mm mm    mm mm    mm mm    Left upper extremity vein map  Basilic  Segment Diameter Depth Comment  1. Axilla mm mm   2. Mid upper arm 1.54mm 6.08mm 32m Abo63mC 1.42mm 5.12mm   38mn AC35m4mm 4.82mm[facility] Hospitalran45m5. B36m AC 1.04mm mm   6. Mid f20mrm mm mm   7. Wrist mm mm    mm mm    mm mm    mm mm    Left cephalic not visualized.     Alison Gross, RDMS, [place] Mellow016

## 2015-01-03 NOTE — Progress Notes (Signed)
ANTICOAGULATION CONSULT NOTE - Follow Up Consult  Pharmacy Consult for Coumadin Indication: atrial fibrillation  No Known Allergies  Patient Measurements: Height: '5\' 5"'$  (165.1 cm) Weight: 132 lb 14.4 oz (60.283 kg) IBW/kg (Calculated) : 57  Vital Signs: Temp: 98.6 F (37 C) (10/26 0742) Temp Source: Oral (10/26 0742) BP: 100/42 mmHg (10/26 0742) Pulse Rate: 68 (10/26 0742)  Labs:  Recent Labs  01/01/15 1125 01/01/15 1535 01/02/15 0327 01/03/15 0341  HGB 8.9*  --   --  8.4*  HCT 28.3*  --   --  26.3*  PLT 319  --   --  287  LABPROT  --  50.8* 49.8* 45.9*  INR  --  5.88* 5.73* 5.15*  CREATININE 6.99*  --  6.58* 6.13*    Estimated Creatinine Clearance: 6.6 mL/min (by C-G formula based on Cr of 6.13).  Assessment: 82yof on coumadin pta for afib, admitted with volume overload. INR on admission supratherapeutic at 5.88 and dose held. INR remains above goal today at 5.15. No bleeding reported. MD ordered vitamin k 5 mg b/c pt needs BM bx and HD access placement.  Hg 8.4, pltc wnl.  No bleeding reported.  Goal of Therapy:  INR 2-3 Monitor platelets by anticoagulation protocol: Yes   Plan:  - MD ordered vitamin K 5 mg po x 1 - No coumadin tonight - coumadin should remain on hold due to pending BM bx and need for HD access placement - INR in AM  Eudelia Bunch, Pharm.D. [phone] 01/03/2015 1:32 PM

## 2015-01-03 NOTE — Patient Outreach (Signed)
[facility]) Care Management  01/03/2015  Alison Gross [birth year] [id number]   Noted on 10/24 that member was admitted to the [facility] liaison notified of admission, this care manager will initiate transition of care program once member is discharged.  Valente David, BSN, [facility]  [facility] Care Manager [phone]

## 2015-01-03 NOTE — Progress Notes (Signed)
S: Sitting up eating.  No N/V.  Saw dialysis videos and says she understands things pretty well O:BP 100/42 mmHg  Pulse 68  Temp(Src) 98.6 F (37 C) (Oral)  Resp 12  Ht '5\' 5"'$  (1.651 m)  Wt 60.283 kg (132 lb 14.4 oz)  BMI 22.12 kg/m2  SpO2 94%  Intake/Output Summary (Last 24 hours) at 01/03/15 0900 Last data filed at 01/03/15 0731  Gross per 24 hour  Intake    422 ml  Output   1500 ml  Net  -1078 ml   Weight change:  IOX:BDZHG and alert CVS:RRR 2/6 systolic M Resp:Decreased BS bases.  Rt pleurex tube Abd:+ BS NTND Ext: 3+ edema LE NEURO:CNI Ox3 no asterixis   . amiodarone  200 mg Oral Daily  . anastrozole  1 mg Oral Daily  . darbepoetin (ARANESP) injection - NON-DIALYSIS  100 mcg Subcutaneous Q Tue-1800  . feeding supplement (ENSURE ENLIVE)  237 mL Oral BID BM  . ferumoxytol  510 mg Intravenous Weekly  . furosemide  160 mg Intravenous 3 times per day  . insulin aspart  0-9 Units Subcutaneous TID WC  . sodium chloride  3 mL Intravenous Q12H  . thiamine  100 mg Oral Daily   Dg Chest 2 View  01/01/2015  CLINICAL DATA:  Shortness of Breath EXAM: CHEST  2 VIEW COMPARISON:  December 28, 2014 chest radiograph and CT of the neck and upper chest September 21, 2014 FINDINGS: There remain small pleural effusions bilaterally with bibasilar atelectasis. There is a drainage catheter on the left, unchanged in position. No pneumothorax. Heart is enlarged with pulmonary vascular within normal limits. The large right substernal mass, noted previously to represent marked thyroid enlargement, is again noted and stable. No adenopathy appreciable. IMPRESSION: The marked enlargement of the right lobe of the thyroid is again noted. There are small pleural effusions bilaterally with bibasilar atelectasis. In comparison with recent chest radiograph, there is slightly less atelectasis and effusion on the left and right sides. No new opacity. No change in cardiac silhouette. Electronically Signed   By: [name] III M.D.   On: 01/01/2015 12:48   US Renal  01/01/2015  CLINICAL DATA:  Acute renal failure.  Diabetes. EXAM: RENAL / URINARY TRACT ULTRASOUND COMPLETE COMPARISON:  None. FINDINGS: Right Kidney: Length: 9.1 cm.  Diffusely echogenic.  No hydronephrosis. Left Kidney: Length: 9.4 cm. Diffusely echogenic. No hydronephrosis. Several tiny cysts. Bladder: Appears normal for degree of bladder distention. A small amount of free peritoneal fluid is noted. IMPRESSION: 1. Diffusely echogenic kidneys, compatible with medical renal disease. 2. No hydronephrosis. 3. Small amount of ascites. Electronically Signed   By: Claudie Revering M.D.   On: 01/01/2015 16:25   BMET    Component Value Date/Time   NA 134* 01/03/2015 0341   NA 143 09/18/2014 0946   K 4.7 01/03/2015 0341   K 4.0 09/18/2014 0946   CL 104 01/03/2015 0341   CL 107 08/05/2012 1009   CO2 21* 01/03/2015 0341   CO2 30* 09/18/2014 0946   GLUCOSE 108* 01/03/2015 0341   GLUCOSE 94 09/18/2014 0946   GLUCOSE 128* 08/05/2012 1009   BUN 159* 01/03/2015 0341   BUN 31.2* 09/18/2014 0946   CREATININE 6.13* 01/03/2015 0341   CREATININE 1.7* 09/18/2014 0946   CALCIUM 8.5* 01/03/2015 0341   CALCIUM 11.8* 09/18/2014 0946   CALCIUM 10.7* 07/27/2014 0830   GFRNONAA 6* 01/03/2015 0341   GFRAA 7* 01/03/2015 0341   CBC  Component Value Date/Time   WBC 5.0 01/03/2015 0341   WBC 4.5 09/18/2014 0946   RBC 3.04* 01/03/2015 0341   RBC 3.34* 09/18/2014 0946   RBC 2.80* 01/24/2012 1011   HGB 8.4* 01/03/2015 0341   HGB 9.6* 09/18/2014 0946   HCT 26.3* 01/03/2015 0341   HCT 29.4* 09/18/2014 0946   PLT 287 01/03/2015 0341   PLT 319 09/18/2014 0946   MCV 86.5 01/03/2015 0341   MCV 87.9 09/18/2014 0946   MCH 27.6 01/03/2015 0341   MCH 28.7 09/18/2014 0946   MCHC 31.9 01/03/2015 0341   MCHC 32.7 09/18/2014 0946   RDW 17.0* 01/03/2015 0341   RDW 16.8* 09/18/2014 0946   LYMPHSABS 0.5* 11/07/2014 1206   LYMPHSABS 0.6* 09/18/2014 0946   MONOABS  0.9 11/07/2014 1206   MONOABS 0.6 09/18/2014 0946   EOSABS 0.1 11/07/2014 1206   EOSABS 0.3 09/18/2014 0946   BASOSABS 0.0 11/07/2014 1206   BASOSABS 0.1 09/18/2014 0946     Assessment:  1. Acute on CKD 4 that I suspect is related to cardiorenal syndrome due to diastolic dysfunction +/- Rt heart failure.  I would expect to see proteinuria if amyloid was playing a role though myeloma kidney could present this way 2. Hyperkalemia, improved 3. Hx M-spike thought to be MGUS, immunofixation P 4. Anemia on aranesp and given IV iron  Plan: 1.  Scr trending down a little and she is tolerating the high BUN surprisingly well.  Will see how things go over next few days.  INR too high to place a permcath anyway so this gives [place] time to follow.  I would not resume the coumadin until final decision on HD is made 2. The M spike has increased and ? If myeloma kidney is cause for worsening renal fx.  Would recommend asking hematology to see pt to see if BM bx needed. 3. I would correct her INR in case she needs some procedures done, ie at least give Vit K   Shayle Donahoo T

## 2015-01-03 NOTE — Progress Notes (Signed)
PROGRESS NOTE    Alison Gross YDX:[id number] DOB: September 11, 1932 DOA: 01/01/2015 PCP: Maximino Greenland, MD  Primary Cardiologist: Dr Einar Gip Thoracic Surgeon: Dr. Servando Snare Primary nephrologist: Dr. Edrick Oh  HPI/Brief narrative 81 year old female with history of HTN, DM, chronic diastolic CHF, breast cancer status post bilateral mastectomies, PAF, chronic kidney disease stage 4(baseline creatinine~2), recurrent exudative right pleural effusion s/p right Pleurx catheter, right axillary/subclavian DVT, M spike presented to [facility] ED on 01/01/15 with worsening dyspnea, weight gain, weakness and worsening renal functions. She was seen by her cardiologist on 10/19 and Lasix was changed to [facility]. In the ED, creatinine up from 2.5-6.9, BNP 2364, hypotensive range blood pressures in the 80s-90s, elevated troponin and supratherapeutic INR 5.8. She was hospitalized for management of acute on chronic renal failure, acute on chronic diastolic CHF and hypotension. Advanced CHF team and nephrology are consulting.   Assessment/Plan:  Acute on stage IV chronic kidney disease/hyperkalemia - Baseline creatinine ~2, Admitted with creatinine of 6.9. - Follows with Dr. Edrick Oh, nephrology - Etiology may be multifactorial: Cardiorenal syndrome due to diastolic dysfunction, slight increase in M spike> myeloma? - Nephrology consulted: Kayexalate, renal ultrasound-no hydronephrosis readmitted medical renal disease & renal diet. Cardiology discussed with nephrology and started high-dose IV Lasix. -  Creatinine is marginally decreased from 6.99-6.88. - May need dialysis-nephrology discussing with patient, will give vitamin K today given supratherapeutic INR, as will need bone marrow biopsy, and may need permacath.  Acute on chronic diastolic CHF - Patient is significantly volume overloaded in the setting of worsening renal function. - Started on high-dose IV Lasix by cardiology. With moderate response. - Repeat  echo remains was preserved LV function  Recurrent right pleural effusion, s/p Pleurx catheter - cytology from her Right effusion has been negative x4  Paroxysmal atrial fibrillation - Currently in SB 50's - SR in the 60s. - INR supratherapeutic, likely secondary to acute illness. -Continue amiodarone. Metoprolol on hold secondary to hypotension.   History of breast cancer status post bilateral mastectomies 2014 - Oncology input appreciated. In remission.  MGUS - Oncology input appreciated. Do not believe that her breast cancer history  - Discussed with Dr. Jana Hakim, patient has slight increase in her M spike, will need bone marrow biopsy, and will need to repeat bone survey.  Iron deficiency anemia - Starting Aranesp & IV Feraheme - follow CBC's  Hypotension - Asymptomatic and stable. Holding blood pressure medications.  Uncontrolled DM 2 with renal complications - Holding oral diabetics. Continue NovoLog SSI. Controlled.  Supratherapeutic INR - Admitted with INR of 5.8. Likely elevated from acute illness. INR continued to be elevated, will give vitamin K, as patient will need bone marrow biopsy, as well may need permacath.  Goiter - Abnormal TSH <0.010. May be related to amiodarone.  free T4 is elevated at 3.48 , free T 3 wnl at 2.3, this picture is compatible with hyperthyroidism, discussed with Dr. Buddy Duty, although will follow with her as an outpatient(his office will call her with an appointment), will start on low-dose methimazole, will discuss with cardiology if it's able to change amiodarone.    DVT prophylaxis: Patient has supratherapeutic INR from Coumadin at home.  Code Status: Full  Family Communication: None at bedside  Disposition Plan: DC home when medically stable.   Consultants:  Advanced heart failure team  Nephrology  Procedures:  Patient has a right Pleurx catheter from prior to admission-drained daily.  Antibiotics:  None  Subjective: Feels  better. Dyspnea improved. No improvement in  urine output.  Objective: Filed Vitals:   01/03/15 0000 01/03/15 0324 01/03/15 0731 01/03/15 0742  BP: 95/41 111/49  100/42  Pulse: 63 86  68  Temp: 97.9 F (36.6 C) 98 F (36.7 C)  98.6 F (37 C)  TempSrc: Oral Oral  Oral  Resp: 14   12  Height:      Weight:   60.283 kg (132 lb 14.4 oz)   SpO2: 100%   94%    Intake/Output Summary (Last 24 hours) at 01/03/15 1134 Last data filed at 01/03/15 0933  Gross per 24 hour  Intake    356 ml  Output   1200 ml  Net   -844 ml   Filed Weights   01/01/15 1518 01/02/15 0500 01/03/15 0731  Weight: 61.1 kg (134 lb 11.2 oz) 60.8 kg (134 lb 0.6 oz) 60.283 kg (132 lb 14.4 oz)     Exam:  General exam: Elderly frail female patient sitting up comfortably in bed without distress.  Respiratory system: Reduced breath sounds in the bases with scattered occasional bibasal crackles. Rest of lung fields clear to auscultation. No increased work of breathing. Cardiovascular system: S1 & S2 heard, RRR. No JVD, murmurs, gallops, clicks or pedal edema.Telemetry: SB 50s-SR 60s  Gastrointestinal system: Abdomen is nondistended, soft and nontender. Normal bowel sounds heard. Central nervous system: Alert and oriented. No focal neurological deficits. Extremities: Symmetric 5 x 5 power.   Data Reviewed: Basic Metabolic Panel:  Recent Labs Lab 01/01/15 1125 01/02/15 0327 01/03/15 0341  NA 139 141 134*  K 5.9* 5.1 4.7  CL 110 110 104  CO2 20* 20* 21*  GLUCOSE 127* 112* 108*  BUN 159* 159* 159*  CREATININE 6.99* 6.58* 6.13*  CALCIUM 8.9 8.8* 8.5*  PHOS  --  7.1* 7.5*   Liver Function Tests:  Recent Labs Lab 01/01/15 1125 01/02/15 0327 01/03/15 0341  AST 28  --   --   ALT 30  --   --   ALKPHOS 93  --   --   BILITOT 0.3  --   --   PROT 5.5*  --   --   ALBUMIN 2.6* 2.2* 2.4*   No results for input(s): LIPASE, AMYLASE in the last 168 hours. No results for input(s): AMMONIA in the last 168  hours. CBC:  Recent Labs Lab 01/01/15 1125 01/03/15 0341  WBC 4.8 5.0  HGB 8.9* 8.4*  HCT 28.3* 26.3*  MCV 86.3 86.5  PLT 319 287   Cardiac Enzymes: No results for input(s): CKTOTAL, CKMB, CKMBINDEX, TROPONINI in the last 168 hours. BNP (last 3 results) No results for input(s): PROBNP in the last 8760 hours. CBG:  Recent Labs Lab 01/02/15 0748 01/02/15 1204 01/02/15 1742 01/02/15 2303 01/03/15 0838  GLUCAP 113* 136* 131* 105* 84    Recent Results (from the past 240 hour(s))  MRSA PCR Screening     Status: None   Collection Time: 01/01/15  3:10 PM  Result Value Ref Range Status   MRSA by PCR NEGATIVE NEGATIVE Final    Comment:        The GeneXpert MRSA Assay (FDA approved for NASAL specimens only), is one component of a comprehensive MRSA colonization surveillance program. It is not intended to diagnose MRSA infection nor to guide or monitor treatment for MRSA infections.   Urine culture     Status: None (Preliminary result)   Collection Time: 01/01/15  4:46 PM  Result Value Ref Range Status   Specimen Description URINE, CLEAN  CATCH  Final   Special Requests NONE  Final   Culture NO GROWTH < 24 HOURS  Final   Report Status PENDING  Incomplete          Studies: Dg Chest 2 View  01/01/2015  CLINICAL DATA:  Shortness of Breath EXAM: CHEST  2 VIEW COMPARISON:  December 28, 2014 chest radiograph and CT of the neck and upper chest September 21, 2014 FINDINGS: There remain small pleural effusions bilaterally with bibasilar atelectasis. There is a drainage catheter on the left, unchanged in position. No pneumothorax. Heart is enlarged with pulmonary vascular within normal limits. The large right substernal mass, noted previously to represent marked thyroid enlargement, is again noted and stable. No adenopathy appreciable. IMPRESSION: The marked enlargement of the right lobe of the thyroid is again noted. There are small pleural effusions bilaterally with bibasilar  atelectasis. In comparison with recent chest radiograph, there is slightly less atelectasis and effusion on the left and right sides. No new opacity. No change in cardiac silhouette. Electronically Signed   By: [name] III M.D.   On: 01/01/2015 12:48   US Renal  01/01/2015  CLINICAL DATA:  Acute renal failure.  Diabetes. EXAM: RENAL / URINARY TRACT ULTRASOUND COMPLETE COMPARISON:  None. FINDINGS: Right Kidney: Length: 9.1 cm.  Diffusely echogenic.  No hydronephrosis. Left Kidney: Length: 9.4 cm. Diffusely echogenic. No hydronephrosis. Several tiny cysts. Bladder: Appears normal for degree of bladder distention. A small amount of free peritoneal fluid is noted. IMPRESSION: 1. Diffusely echogenic kidneys, compatible with medical renal disease. 2. No hydronephrosis. 3. Small amount of ascites. Electronically Signed   By: Claudie Revering M.D.   On: 01/01/2015 16:25        Scheduled Meds: . amiodarone  200 mg Oral Daily  . anastrozole  1 mg Oral Daily  . darbepoetin (ARANESP) injection - NON-DIALYSIS  100 mcg Subcutaneous Q Tue-1800  . feeding supplement (ENSURE ENLIVE)  237 mL Oral BID BM  . ferumoxytol  510 mg Intravenous Weekly  . furosemide  160 mg Intravenous 3 times per day  . insulin aspart  0-9 Units Subcutaneous TID WC  . phytonadione  5 mg Oral Once  . sodium chloride  3 mL Intravenous Q12H  . thiamine  100 mg Oral Daily   Continuous Infusions:   Principal Problem:   Acute on chronic renal failure (HCC) Active Problems:   MGUS (monoclonal gammopathy of unknown significance)   Ductal carcinoma in situ (DCIS) of left breast   Acute on chronic diastolic heart failure (HCC)   Type 2 diabetes mellitus with renal manifestations (HCC)   Hypotension   Substernal goiter   PAF (paroxysmal atrial fibrillation) (HCC)   Diastolic CHF, acute on chronic (HCC)   Stage 4 chronic kidney disease due to arterionephrosclerosis (HCC)   Pleural effusion exudative   Supratherapeutic  INR    Time spent: 45 minutes    Zachari Alberta, MD,  [facility] Pager [phone]  If 7PM-7AM, please contact night-coverage [email or web address] Password [email or web address] 01/03/2015, 11:34 AM    LOS: 2 days

## 2015-01-03 NOTE — Progress Notes (Addendum)
Advanced Heart Failure Rounding Note   Subjective:    Admitted with volume overload in the setting of worsening renal a function.   Now on high dose lasix. Urine output picking up some. Remains markedly volume overloaded.   Denies SOB.   Renal u/s with small kidneys and significant medico-renal disease  Echo EF 60-65% + LVH small effusion   Creatinine 6.58 -> 6.13  BUN remains 159   Objective:   Weight Range:  Vital Signs:   Temp:  [97.9 F (36.6 C)-98.6 F (37 C)] 98.6 F (37 C) (10/26 0742) Pulse Rate:  [61-86] 68 (10/26 0742) Resp:  [12-20] 12 (10/26 0742) BP: (82-111)/(33-49) 100/42 mmHg (10/26 0742) SpO2:  [94 %-100 %] 94 % (10/26 0742) Weight:  [60.283 kg (132 lb 14.4 oz)] 60.283 kg (132 lb 14.4 oz) (10/26 0731) Last BM Date: 01/01/15  Weight change: Filed Weights   01/01/15 1518 01/02/15 0500 01/03/15 0731  Weight: 61.1 kg (134 lb 11.2 oz) 60.8 kg (134 lb 0.6 oz) 60.283 kg (132 lb 14.4 oz)    Intake/Output:   Intake/Output Summary (Last 24 hours) at 01/03/15 1019 Last data filed at 01/03/15 0933  Gross per 24 hour  Intake    356 ml  Output   1900 ml  Net  -1544 ml     Physical Exam: General:   No resp difficulty HEENT: normal Neck: supple. JVP to ear . Carotids 2+ bilat; no bruits. No lymphadenopathy or thryomegaly appreciated. Cor: PMI nondisplaced. Regular rate & rhythm. No rubs, gallops or murmurs. Lungs: clear Abdomen: soft, nontender, nondistended. No hepatosplenomegaly. No bruits or masses. Good bowel sounds. Extremities: no cyanosis, clubbing, rash, R and LLE 3+extends thighs.  Neuro: alert & orientedx3, cranial nerves grossly intact. moves all 4 extremities w/o difficulty. Affect pleasant  Telemetry: Sinus Rhythm 60s    Labs: Basic Metabolic Panel:  Recent Labs Lab 01/01/15 1125 01/02/15 0327 01/03/15 0341  NA 139 141 134*  K 5.9* 5.1 4.7  CL 110 110 104  CO2 20* 20* 21*  GLUCOSE 127* 112* 108*  BUN 159* 159* 159*    CREATININE 6.99* 6.58* 6.13*  CALCIUM 8.9 8.8* 8.5*  PHOS  --  7.1* 7.5*    Liver Function Tests:  Recent Labs Lab 01/01/15 1125 01/02/15 0327 01/03/15 0341  AST 28  --   --   ALT 30  --   --   ALKPHOS 93  --   --   BILITOT 0.3  --   --   PROT 5.5*  --   --   ALBUMIN 2.6* 2.2* 2.4*   No results for input(s): LIPASE, AMYLASE in the last 168 hours. No results for input(s): AMMONIA in the last 168 hours.  CBC:  Recent Labs Lab 01/01/15 1125 01/03/15 0341  WBC 4.8 5.0  HGB 8.9* 8.4*  HCT 28.3* 26.3*  MCV 86.3 86.5  PLT 319 287    Cardiac Enzymes: No results for input(s): CKTOTAL, CKMB, CKMBINDEX, TROPONINI in the last 168 hours.  BNP: BNP (last 3 results)  Recent Labs  09/28/14 0238 11/07/14 1207 01/01/15 1125  BNP 947.8* 1275.4* 2364.3*    ProBNP (last 3 results) No results for input(s): PROBNP in the last 8760 hours.    Other results:  Imaging: Dg Chest 2 View  01/01/2015  CLINICAL DATA:  Shortness of Breath EXAM: CHEST  2 VIEW COMPARISON:  December 28, 2014 chest radiograph and CT of the neck and upper chest September 21, 2014 FINDINGS: There  remain small pleural effusions bilaterally with bibasilar atelectasis. There is a drainage catheter on the left, unchanged in position. No pneumothorax. Heart is enlarged with pulmonary vascular within normal limits. The large right substernal mass, noted previously to represent marked thyroid enlargement, is again noted and stable. No adenopathy appreciable. IMPRESSION: The marked enlargement of the right lobe of the thyroid is again noted. There are small pleural effusions bilaterally with bibasilar atelectasis. In comparison with recent chest radiograph, there is slightly less atelectasis and effusion on the left and right sides. No new opacity. No change in cardiac silhouette. Electronically Signed   By: [name] III M.D.   On: 01/01/2015 12:48   US Renal  01/01/2015  CLINICAL DATA:  Acute renal failure.   Diabetes. EXAM: RENAL / URINARY TRACT ULTRASOUND COMPLETE COMPARISON:  None. FINDINGS: Right Kidney: Length: 9.1 cm.  Diffusely echogenic.  No hydronephrosis. Left Kidney: Length: 9.4 cm. Diffusely echogenic. No hydronephrosis. Several tiny cysts. Bladder: Appears normal for degree of bladder distention. A small amount of free peritoneal fluid is noted. IMPRESSION: 1. Diffusely echogenic kidneys, compatible with medical renal disease. 2. No hydronephrosis. 3. Small amount of ascites. Electronically Signed   By: Claudie Revering M.D.   On: 01/01/2015 16:25     Medications:     Scheduled Medications: . amiodarone  200 mg Oral Daily  . anastrozole  1 mg Oral Daily  . darbepoetin (ARANESP) injection - NON-DIALYSIS  100 mcg Subcutaneous Q Tue-1800  . feeding supplement (ENSURE ENLIVE)  237 mL Oral BID BM  . ferumoxytol  510 mg Intravenous Weekly  . furosemide  160 mg Intravenous 3 times per day  . insulin aspart  0-9 Units Subcutaneous TID WC  . sodium chloride  3 mL Intravenous Q12H  . thiamine  100 mg Oral Daily    Infusions:    PRN Medications: sodium chloride, acetaminophen, albuterol, ondansetron (ZOFRAN) IV, sodium chloride   Assessment:   1. A/C CKD, stage 5 2. Hyperkalemia 3. A/C Diastolic HF 4. Recurrent pleural effusion on R with pleurex cathter in place 5. A fib  6. H/O Breast Cancer S/P bilateral mastectomies 2014  7. Pericardial effusion     Plan/Discussion:    Moderate response to IV lasix but significant volume overload persists. ECHO remains with preserved LV function  Suspect major issue is progressive intrinsic renal failure. Renal continue to follow.  Agree with hematology consult. Can also consider fat pad biopsy.  Suspect pleural effusion may improve (and Pleurex cath drainage slow don when volume status better controlled)  I discussed case with Dr. Einar Gip. Who knows her well.  Tarvaris Puglia,MD 10:19 AM  Advanced Heart Failure Team Pager  [phone] (M-F; 7a - 4p)  Please contact [facility] for night-coverage after hours (4p -7a ) and weekends on [email or web address]

## 2015-01-03 NOTE — H&P (Signed)
Chief Complaint: Patient was seen in consultation today for monoclonal spike and renal failure Chief Complaint  Patient presents with  . Shortness of Breath   at the request of [facility]  Referring Physician(s): [facility] Dr. Waldron Labs  History of Present Illness: Alison Gross is a 81 y.o. female with history of breast cancer and recurrent right pleural effusion s/p right pleurX catheter that is drained daily. She is noted to have a monoclonal spike and worsening renal failure. IR received request for image guided bone marrow biopsy. She denies any chest pain, shortness of breath or palpitations. She denies any active signs of bleeding or excessive bruising. She denies any recent fever or chills. The patient denies any history of sleep apnea, but does sleep with oxygen. She has previously tolerated sedation without complications.    Past Medical History  Diagnosis Date  . Hypercholesterolemia     takes Crestor daily  . Thyroid disease     had iodine radiation  . Hypertension     takes Hyzaar daily  . Dysrhythmia     takes Carvedilol daily  . Pneumonia     history of;last time about 4-76yr ago  . GERD (gastroesophageal reflux disease)     takes Protonix daily  . Gastric ulcer   . History of blood transfusion   . History of colon polyps   . Anemia     takes iron pill daily  . Diabetes mellitus without complication ([facility]     takes Tradjenta daily  . History of radiation therapy 07/12/12-08/26/12    right breast/  . [place]ox[place]mal atrial fibrillation [place] [place]     PCP EKG 09/27/2013: A. Fibrillation.. EKG 09/29/2013: S. Tach.  . Breast cancer ([facility] 04/12/12    right-pos lymph node/left-DCIS  . Shortness of breath on exertion   . Bruit of left carotid artery   . Acute upper GI bleeding 01/24/2012  . MGUS (monoclonal gammopathy of unknown significance) 04/21/2013  . Osteoporosis 11/29/2013  . Recurrent right pleural effusion 07/26/2014  . Stage III chronic kidney disease 07/26/2014  . Goiter  07/27/2014  . Substernal goiter 12/23/2012  . Pleural effusion, right 12/23/2012  . Type 2 diabetes mellitus with renal manifestations ([facility] 07/26/2014  . Dysphonia 09/20/2014  . Vocal cord paralysis     Suspected due to massive substernal goiter  . CHF (congestive heart failure) ([facility]     Past Surgical History  Procedure Laterality Date  . Carpal tunnel release Left   . Breast biopsy    . Esophagogastroduodenoscopy  01/24/2012    Procedure: ESOPHAGOGASTRODUODENOSCOPY (EGD);  Surgeon: MJeryl Columbia MD;  [facility];  Service: Endoscopy;  Laterality: N/A;  . Thyroid surgery      "removed goiter"  . Cystectomy      from back of neck  . Knee surgery      right with rods  . Esophagogastroduodenoscopy    . Colonoscopy    . Total mastectomy Bilateral 05/10/2012    Procedure: RIGHT modified mastectomy; LEFT total mastectomy;  Surgeon: CHaywood Lasso MD;  [place] [facility]  Service: General;  Laterality: Bilateral;  . Axillary sentinel node biopsy Right 05/10/2012    Procedure: AXILLARY SENTINEL lymph  NODE BIOPSY;  Surgeon: CHaywood Lasso MD;  [place]  Service: General;  Laterality: Right;  nuclear medicine injection right side  7:00 am   . Abdominal hysterectomy      fibroids, with bilateral SO  . Chest tube insertion Right 09/27/2014  Procedure: INSERTION OF RIGHT PLEURAL DRAINAGE CATHETER;  Surgeon: [name], MD;  Location: [place];  Service: Thoracic;  Laterality: Right;    Allergies: Review of patient's allergies indicates no known allergies.  Medications: Prior to Admission medications   Medication Sig Start Date End Date Taking? Authorizing Provider  acetaminophen (TYLENOL) 500 MG tablet Take 500 mg by mouth 2 (two) times daily as needed for moderate pain or headache.    Yes Historical Provider, MD  amiodarone (PACERONE) 200 MG tablet Take 1 tablet (200 mg total) by mouth daily. 11/04/14  Yes Adrian Prows, MD  anastrozole (ARIMIDEX) 1 MG tablet TAKE  1 TABLET (1 MG TOTAL) BY MOUTH DAILY. 10/17/14  Yes Laurie Panda, NP  Cholecalciferol (VITAMIN D) 2000 UNITS CAPS Take by mouth.   Yes Historical Provider, MD  feeding supplement, ENSURE ENLIVE, (ENSURE ENLIVE) LIQD Take 237 mLs by mouth 2 (two) times daily between meals. 07/28/14  Yes Venetia Maxon Rama, MD  ferrous sulfate 325 (65 FE) MG tablet Take 1 tablet (325 mg total) by mouth 3 (three) times daily with meals. 08/25/14  Yes Janece Canterbury, MD  linagliptin (TRADJENTA) 5 MG TABS tablet Take 5 mg by mouth daily.   Yes Historical Provider, MD  metoprolol tartrate (LOPRESSOR) 25 MG tablet Take 0.5 tablets (12.5 mg total) by mouth 2 (two) times daily. 11/08/14  Yes Adrian Prows, MD  Multiple Vitamin (MULTIVITAMIN WITH MINERALS) TABS Take 1 tablet by mouth every other day.    Yes Historical Provider, MD  thiamine 100 MG tablet Take 1 tablet (100 mg total) by mouth daily. 11/04/14  Yes Adrian Prows, MD  torsemide (DEMADEX) 20 MG tablet Take 20 mg by mouth daily. 12/29/14  Yes Historical Provider, MD  warfarin (COUMADIN) 2 MG tablet Take 2 mg by mouth daily at 6 PM. OR AS DIRECTED 10/16/14  Yes Historical Provider, MD  furosemide (LASIX) 40 MG tablet Take 1 tablet (40 mg total) by mouth every morning. Patient not taking: Reported on 01/01/2015 11/08/14   Adrian Prows, MD  VENTOLIN HFA 108 (90 BASE) MCG/ACT inhaler Inhale 2 puffs into the lungs every 4 (four) hours as needed for wheezing or shortness of breath.  10/05/14   Historical Provider, MD     Family History  Problem Relation Age of Onset  . Brain cancer Mother   . Aneurysm Father   . Diabetes Sister   . Diabetes Brother   . Diabetes Sister   . Diabetes Brother   . Heart failure Sister     Social History   Social History  . Marital Status: Married    Spouse Name: Alison Gross  . Number of Children: 2  . Years of Education: N/A   Social History Main Topics  . Smoking status: Former Smoker -- 0.00 packs/day    Types: Cigarettes    Quit date:  07/26/1971  . Smokeless tobacco: Never Used  . Alcohol Use: No  . Drug Use: No  . Sexual Activity: Not Currently    Birth Control/ Protection: Surgical     Comment: menarche age 35,1st live birth 55, P2, no HRT   Other Topics Concern  . None   Social History Narrative   Married.  Ambulates independently.      Review of Systems: A 12 point ROS discussed and pertinent positives are indicated in the HPI above.  All other systems are negative.  Review of Systems  Vital Signs: BP 100/42 mmHg  Pulse 68  Temp(Src) 98.6 F (37  C) (Oral)  Resp 12  Ht 5' 5"  (1.651 m)  Wt 132 lb 14.4 oz (60.283 kg)  BMI 22.12 kg/m2  SpO2 94%  Physical Exam  Constitutional: She is oriented to person, place, and time. No distress.  HENT:  Head: Normocephalic and atraumatic.  Cardiovascular: Normal rate and regular rhythm.  Exam reveals no gallop and no friction rub.   No murmur heard. Pulmonary/Chest: Effort normal and breath sounds normal. No respiratory distress. She has no wheezes. She has no rales.  Right PleurX catheter intact  Neurological: She is alert and oriented to person, place, and time.  Skin: Skin is warm and dry. She is not diaphoretic.    Mallampati Score:  MD Evaluation Airway: WNL Heart: WNL Abdomen: WNL Chest/ Lungs: WNL ASA  Classification: 3 Mallampati/Airway Score: Two  Imaging: Dg Chest 2 View  01/01/2015  CLINICAL DATA:  Shortness of Breath EXAM: CHEST  2 VIEW COMPARISON:  December 28, 2014 chest radiograph and CT of the neck and upper chest September 21, 2014 FINDINGS: There remain small pleural effusions bilaterally with bibasilar atelectasis. There is a drainage catheter on the left, unchanged in position. No pneumothorax. Heart is enlarged with pulmonary vascular within normal limits. The large right substernal mass, noted previously to represent marked thyroid enlargement, is again noted and stable. No adenopathy appreciable. IMPRESSION: The marked enlargement of the  right lobe of the thyroid is again noted. There are small pleural effusions bilaterally with bibasilar atelectasis. In comparison with recent chest radiograph, there is slightly less atelectasis and effusion on the left and right sides. No new opacity. No change in cardiac silhouette. Electronically Signed   By: [name] III M.D.   On: 01/01/2015 12:48   Dg Chest 2 View  12/28/2014  CLINICAL DATA:  Pleural effusion. EXAM: CHEST  2 VIEW COMPARISON:  12/11/2014.  11/16/2014.  CT 09/21/2014 FINDINGS: Stable right paratracheal substernal mass consistent previously thyroid lesion. Stable cardiomegaly. No focal acute infiltrate. Scratch Stable small right pleural effusion. Stable small left pleural effusion. Right chest tube in stable position. No pneumothorax. Surgical clips noted over the right chest. IMPRESSION: 1. Right chest tube in stable position. Stable bilateral pleural effusions. 2. Scratched stable large right paratracheal thyroid mass. Electronically Signed   By: Marcello Moores  Register   On: 12/28/2014 12:32   Dg Chest 2 View  12/11/2014  CLINICAL DATA:  Followup pleural effusions. Breast carcinoma. Goiter. EXAM: CHEST  2 VIEW COMPARISON:  11/16/2014 and previous CT on 09/19/2014 FINDINGS: Right chest tube remains in place. Small right pleural effusion shows mild decrease in size since previous study. No pneumothorax visualized. Small left pleural effusion remains stable. Heart size remains stable. Large right mediastinal mass is unchanged and consistent with substernal goiter shown on recent CT. IMPRESSION: Mild decrease in right pleural effusion. No pneumothorax visualized. Stable small left pleural effusion. Stable mediastinal mass, consistent with substernal goiter showed on recent CT. Electronically Signed   By: Earle Gell M.D.   On: 12/11/2014 14:38   US Renal  01/01/2015  CLINICAL DATA:  Acute renal failure.  Diabetes. EXAM: RENAL / URINARY TRACT ULTRASOUND COMPLETE COMPARISON:  None.  FINDINGS: Right Kidney: Length: 9.1 cm.  Diffusely echogenic.  No hydronephrosis. Left Kidney: Length: 9.4 cm. Diffusely echogenic. No hydronephrosis. Several tiny cysts. Bladder: Appears normal for degree of bladder distention. A small amount of free peritoneal fluid is noted. IMPRESSION: 1. Diffusely echogenic kidneys, compatible with medical renal disease. 2. No hydronephrosis. 3. Small amount of ascites.  Electronically Signed   By: Claudie Revering M.D.   On: 01/01/2015 16:25   Dg Bone Survey Met  01/03/2015  ADDENDUM REPORT: 01/03/2015 13:51 ADDENDUM: Critical Value/emergent results were discussed with the patient's provider Alison Gross on 01/03/2015 at time 1:50 pm. Electronically Signed   By: Nolon Nations M.D.   On: 01/03/2015 13:51  01/03/2015  CLINICAL DATA:  History of MGUS with worsening renal function. Shortness of breath. Pain all over body. History of breast cancer. EXAM: METASTATIC BONE SURVEY COMPARISON:  07/27/2014 bone scan, PET-CT 09/18/2014, and metastatic survey 12/06/2012 FINDINGS: No lytic or blastic lesions are identified to suggest metastases or multiple myeloma deposits. Patient is a edentulous. Degenerative changes are seen in the cervical, thoracic, and lumbar spine. Significant disc height loss and sclerosis identified at L3-4. Previous ORIF of right distal tibia and fibular fractures. Small probable bone island identified within the distal third of the left radius. The left radius was not imaged previously. Superior mediastinal mass consistent with enlarged thyroid as seen on prior studies. There has been development of lucency at the right lung base. Patient has a right pleural catheter. There is small right pleural effusion. Suspect interval development of right basilar pneumothorax. Small left pleural effusion is present. There is left lower lobe infiltrate, stable in appearance. Status post bilateral mastectomy and bilateral axillary node dissection. IMPRESSION: 1.  Interval development of right basilar pneumothorax. 2. Persistent small right pleural effusion. 3. Persistent left lower lobe infiltrate and pleural effusion. 4. Large right mediastinal mass stable and consistent with enlarged thyroid. 5. No suspicious lytic or blastic lesions. Critical Value/emergent results will be telephoned to the patient's provider Alison [facility] at the time of interpretation on date 01/03/2015 at time 1:44 pm. Electronically Signed: By: Nolon Nations M.D. On: 01/03/2015 13:44    Labs:  CBC:  Recent Labs  11/03/14 0452 11/07/14 1206 01/01/15 1125 01/03/15 0341  WBC 6.7 6.1 4.8 5.0  HGB 9.8* 10.6* 8.9* 8.4*  HCT 30.5* 32.7* 28.3* 26.3*  PLT 253 PLATELET CLUMPS NOTED ON SMEAR, COUNT APPEARS ADEQUATE 319 287    COAGS:  Recent Labs  09/21/14 0813 09/21/14 1901 09/22/14 0500  09/26/14 2219  11/08/14 0318 01/01/15 1535 01/02/15 0327 01/03/15 0341  INR  --   --   --   < > 1.17  < > 2.68* 5.88* 5.73* 5.15*  APTT 34 77* 91*  --  96*  --   --   --   --   --   < > = values in this interval not displayed.  BMP:  Recent Labs  11/08/14 0318 01/01/15 1125 01/02/15 0327 01/03/15 0341  NA 140 139 141 134*  K 4.0 5.9* 5.1 4.7  CL 107 110 110 104  CO2 24 20* 20* 21*  GLUCOSE 103* 127* 112* 108*  BUN 50* 159* 159* 159*  CALCIUM 8.5* 8.9 8.8* 8.5*  CREATININE 2.27* 6.99* 6.58* 6.13*  GFRNONAA 19* 5* 5* 6*  GFRAA 22* 6* 6* 7*    LIVER FUNCTION TESTS:  Recent Labs  09/29/14 0340 10/30/14 2013 11/07/14 1206 01/01/15 1125 01/02/15 0327 01/03/15 0341  BILITOT 0.5 1.0 1.0 0.3  --   --   AST 16 42* 37 28  --   --   ALT 12* 31 35 30  --   --   ALKPHOS 54 85 83 93  --   --   PROT 5.7* 6.5 6.6 5.5*  --   --   ALBUMIN 2.9* 3.3* 2.7*  2.6* 2.2* 2.4*    Assessment and Plan: Monoclonal spike Worsening renal failure Request for image guided bone marrow biopsy The patient will be NPO, labs and vitals have been reviewed. Risks and Benefits discussed  with the patient including, but not limited to bleeding, infection, damage to adjacent structures or low yield requiring additional tests. All of the patient's questions were answered, patient is agreeable to proceed. Consent signed and in chart. Atrial fibrillation on coumadin, INR 5.15 getting Vitamin K Will recheck INR in am and proceed if wnl History of breast cancer Right pleural effusion s/p PleurX catheter in place- drained daily   Thank you for this interesting consult.  I greatly enjoyed meeting Alison Gross and look forward to participating in their care.  A copy of this report was sent to the requesting provider on this date.  SignedHedy Jacob 01/03/2015, 2:52 PM   I spent a total of 20 Minutes in face to face in clinical consultation, greater than 50% of which was counseling/coordinating care for monoclonal spike and renal failure

## 2015-01-04 ENCOUNTER — Inpatient Hospital Stay (HOSPITAL_COMMUNITY): Payer: Commercial Managed Care - HMO

## 2015-01-04 DIAGNOSIS — D472 Monoclonal gammopathy: Secondary | ICD-10-CM

## 2015-01-04 DIAGNOSIS — N184 Chronic kidney disease, stage 4 (severe): Secondary | ICD-10-CM

## 2015-01-04 DIAGNOSIS — N19 Unspecified kidney failure: Secondary | ICD-10-CM

## 2015-01-04 DIAGNOSIS — C50812 Malignant neoplasm of overlapping sites of left female breast: Secondary | ICD-10-CM

## 2015-01-04 DIAGNOSIS — N186 End stage renal disease: Secondary | ICD-10-CM

## 2015-01-04 LAB — RENAL FUNCTION PANEL
Albumin: 2.2 g/dL — ABNORMAL LOW (ref 3.5–5.0)
Anion gap: 8 (ref 5–15)
BUN: 156 mg/dL — AB (ref 6–20)
CHLORIDE: 104 mmol/L (ref 101–111)
CO2: 23 mmol/L (ref 22–32)
Calcium: 8.8 mg/dL — ABNORMAL LOW (ref 8.9–10.3)
Creatinine, Ser: 6.01 mg/dL — ABNORMAL HIGH (ref 0.44–1.00)
GFR calc Af Amer: 7 mL/min — ABNORMAL LOW (ref >60)
GFR calc non Af Amer: 6 mL/min — ABNORMAL LOW (ref >60)
GLUCOSE: 106 mg/dL — AB (ref 65–99)
POTASSIUM: 4.4 mmol/L (ref 3.5–5.1)
Phosphorus: 7.2 mg/dL — ABNORMAL HIGH (ref 2.5–4.6)
Sodium: 135 mmol/L (ref 135–145)

## 2015-01-04 LAB — CBC WITH DIFFERENTIAL/PLATELET
Basophils Absolute: 0 10*3/uL (ref 0.0–0.1)
Basophils Relative: 0 %
EOS PCT: 4 %
Eosinophils Absolute: 0.3 10*3/uL (ref 0.0–0.7)
HEMATOCRIT: 24.9 % — AB (ref 36.0–46.0)
Hemoglobin: 7.9 g/dL — ABNORMAL LOW (ref 12.0–15.0)
LYMPHS ABS: 0.8 10*3/uL (ref 0.7–4.0)
Lymphocytes Relative: 13 %
MCH: 27.2 pg (ref 26.0–34.0)
MCHC: 31.7 g/dL (ref 30.0–36.0)
MCV: 85.9 fL (ref 78.0–100.0)
MONO ABS: 0.7 10*3/uL (ref 0.1–1.0)
Monocytes Relative: 13 %
NEUTROS ABS: 4.1 10*3/uL (ref 1.7–7.7)
Neutrophils Relative %: 70 %
PLATELETS: 319 10*3/uL (ref 150–400)
RBC: 2.9 MIL/uL — AB (ref 3.87–5.11)
RDW: 16.7 % — AB (ref 11.5–15.5)
WBC: 5.9 10*3/uL (ref 4.0–10.5)

## 2015-01-04 LAB — PROTIME-INR
INR: 1.91 — ABNORMAL HIGH (ref 0.00–1.49)
PROTHROMBIN TIME: 21.8 s — AB (ref 11.6–15.2)

## 2015-01-04 LAB — BONE MARROW EXAM

## 2015-01-04 LAB — GLUCOSE, CAPILLARY
GLUCOSE-CAPILLARY: 89 mg/dL (ref 65–99)
GLUCOSE-CAPILLARY: 102 mg/dL — AB (ref 65–99)
Glucose-Capillary: 115 mg/dL — ABNORMAL HIGH (ref 65–99)
Glucose-Capillary: 123 mg/dL — ABNORMAL HIGH (ref 65–99)

## 2015-01-04 MED ORDER — MIDAZOLAM HCL 2 MG/2ML IJ SOLN
INTRAMUSCULAR | Status: AC
  Filled 2015-01-04: qty 2

## 2015-01-04 MED ORDER — FENTANYL CITRATE (PF) 100 MCG/2ML IJ SOLN
INTRAMUSCULAR | Status: AC
  Filled 2015-01-04: qty 2

## 2015-01-04 MED ORDER — DEXTROSE 5 % IV SOLN
1.5000 g | INTRAVENOUS | Status: AC
  Administered 2015-01-05: 1.5 g via INTRAVENOUS
  Filled 2015-01-04 (×2): qty 1.5

## 2015-01-04 MED ORDER — FENTANYL CITRATE (PF) 100 MCG/2ML IJ SOLN
INTRAMUSCULAR | Status: AC | PRN
  Administered 2015-01-04: 25 ug via INTRAVENOUS

## 2015-01-04 MED ORDER — MIDAZOLAM HCL 2 MG/2ML IJ SOLN
INTRAMUSCULAR | Status: AC | PRN
  Administered 2015-01-04: 0.5 mg via INTRAVENOUS

## 2015-01-04 MED ORDER — LIDOCAINE HCL 1 % IJ SOLN
INTRAMUSCULAR | Status: AC
  Filled 2015-01-04: qty 20

## 2015-01-04 NOTE — Progress Notes (Signed)
[facility]  Patient Status: Active (receiving services up to time of hospitalization)  [facility] is providing the following services: RN  If patient discharges after hours, please call [phone].   Janae Sauce 01/04/2015, 5:35 PM

## 2015-01-04 NOTE — Progress Notes (Signed)
The patients family is requesting a family meeting to discuss long term prognosis and treatment options for the patients renal condition. This RN attempted to page the nephrology attending but did not receive a response.  Night shift nurse will be informed so that the families desires can be expressed in the morning.

## 2015-01-04 NOTE — Progress Notes (Signed)
Patient Demographics  Alison Gross, is a 81 y.o. female, DOB - 1932/07/26, FOY:[id number]  Admit date - 01/01/2015   Admitting Physician Debbe Odea, MD  Outpatient Primary MD for the patient is Alison Greenland, MD  LOS - 3   Chief Complaint  Patient presents with  . Shortness of Breath       Admission HPI/Brief narrative: 81 year old female with history of HTN, DM, chronic diastolic CHF, breast cancer status post bilateral mastectomies, PAF, chronic kidney disease stage 4(baseline creatinine~2), recurrent exudative right pleural effusion s/p right Pleurx catheter, right axillary/subclavian DVT, M spike presented to [facility] ED on 01/01/15 with worsening dyspnea, weight gain, weakness and worsening renal functions. She was seen by her cardiologist on 10/19 and Lasix was changed to [facility]. In the ED, creatinine up from 2.5-6.9, BNP 2364, hypotensive range blood pressures in the 80s-90s, elevated troponin and supratherapeutic INR 5.8. She was hospitalized for management of acute on chronic renal failure, acute on chronic diastolic CHF and hypotension. Advanced CHF team and nephrology are consulting. Subjective:   [name] today has, No headache, No chest pain, No abdominal pain - No Nausea, report dyspnea is at baseline.  Assessment & Plan    Principal Problem:   Acute on chronic renal failure (HCC) Active Problems:   MGUS (monoclonal gammopathy of unknown significance)   Ductal carcinoma in situ (DCIS) of left breast   Acute on chronic diastolic heart failure (HCC)   Type 2 diabetes mellitus with renal manifestations (HCC)   Hypotension   Substernal goiter   PAF (paroxysmal atrial fibrillation) (HCC)   Diastolic CHF, acute on chronic (HCC)   Stage 4 chronic kidney disease due to arterionephrosclerosis (HCC)   Pleural effusion exudative   Supratherapeutic INR   ARF (acute renal failure)  (HCC)  Acute on stage IV chronic kidney disease/hyperkalemia - Baseline creatinine ~2, Admitted with creatinine of 6.9. - Follows with Dr. Edrick Oh, nephrology - Etiology may be multifactorial: Cardiorenal syndrome due to diastolic dysfunction, less likely a due to MGUS(increased lambda chain may be related to her renal failure not causing it as per hematology). - renal ultrasound-no hydronephrosis ,Diffusely echogenic kidneys, compatible with medical renal disease - Creatinine is marginally improving, today is 6.01,  - Nephrology consult appreciated, diuresis managed by nephrology, likely will eventually need hemodialysis per nephrology, they will consult [facility] for perm[facility]ath placement. - Hematology consult appreciated, patient went for bone marrow biopsy today to evaluate for possible myeloma.  Acute on chronic diastolic CHF - Patient is significantly volume overloaded in the setting of worsening renal function. - Started on high-dose IV Lasix by cardiology. With moderate response. - Repeat echo remains was preserved LV function  Recurrent right pleural effusion, s/p Pleurx catheter - cytology from her Right effusion has been negative x4, being drained daily, 750 mL over last 24 hours.   Paroxysmal atrial fibrillation - Currently in SB 50's - SR in the 60s. - INR supratherapeutic initially, corrected with vitamin K, INR today is 1.9. - Continue to hold warfarin for anticipated permacath placement tomorrow. -Continue amiodarone. Metoprolol on hold secondary to hypotension.   History of breast cancer status post bilateral mastectomies 2014 - Oncology input appreciated. In remission.  MGUS - Oncology input appreciated. Do not  believe that her breast cancer history  -Oncology consult appreciated, and had bone marrow biopsy is a day.  Iron deficiency anemia - on  Aranesp & IV Feraheme - follow CBC's  Hypotension - Asymptomatic and stable. Holding blood pressure  medications.  Uncontrolled DM 2 with renal complications - Holding oral diabetics. Continue NovoLog SSI. Controlled.  Supratherapeutic INR - Admitted with INR of 5.8. Likely elevated from acute illness. Improved with vitamin K , INR is 1.9 today.  Goiter - Abnormal TSH <0.010. May be related to amiodarone. free T4 is elevated at 3.48 , free T 3 wnl at 2.3, this picture is compatible with hyperthyroidism, discussed with Dr. Buddy Duty,  will follow with her as an outpatient(his office will call her with an appointment), will start on low-dose methimazole, may need to change amiodarone when patient is more stable as alternatives now giving her low blood pressure.  Code Status: Full  Family Communication: none at bedside  Disposition Plan: Remains in stepdown, pending further workup   Procedures  - Bone marrow biopsy by IR 10/27 - Patient has a right Pleurx catheter from prior to admission, which is drained daily.   Consults   Cardiology CHF Nephrology Hematology   Medications  Scheduled Meds: . amiodarone  200 mg Oral Daily  . anastrozole  1 mg Oral Daily  . darbepoetin (ARANESP) injection - NON-DIALYSIS  100 mcg Subcutaneous Q Tue-1800  . feeding supplement (ENSURE ENLIVE)  237 mL Oral BID BM  . fentaNYL      . ferumoxytol  510 mg Intravenous Weekly  . furosemide  160 mg Intravenous 3 times per day  . insulin aspart  0-9 Units Subcutaneous TID WC  . lidocaine      . methimazole  20 mg Oral Daily  . midazolam      . sodium chloride  3 mL Intravenous Q12H  . thiamine  100 mg Oral Daily   Continuous Infusions:  PRN Meds:.sodium chloride, acetaminophen, albuterol, ondansetron (ZOFRAN) IV, sodium chloride  DVT Prophylaxis  on warfarin  Lab Results  Component Value Date   PLT 319 01/04/2015    Antibiotics    Anti-infectives    None          Objective:   Filed Vitals:   01/04/15 0936 01/04/15 0937 01/04/15 0942 01/04/15 0943  BP: _0 80/47   Pulse: 72 73 72 72  Temp:      TempSrc:      Resp: _1 Height:      Weight:      SpO2: 100% 100% 99% 99%    Wt Readings from Last 3 Encounters:  01/04/15 58.9 kg (129 lb 13.6 oz)  12/28/14 60.328 kg (133 lb)  12/11/14 58.06 kg (128 lb)     Intake/Output Summary (Last 24 hours) at 01/04/15 1053 Last data filed at 01/04/15 1040  Gross per 24 hour  Intake    185 ml  Output   1900 ml  Net  -1715 ml     Physical Exam General exam: Elderly frail female patient sitting up comfortably in bed without distress.  Respiratory system: Reduced breath sounds in the bases with scattered occasional bibasal crackles. Rest of lung fields clear to auscultation. No increased work of breathing. Cardiovascular system: S1 & S2 heard, RRR. No JVD, murmurs, gallops, clicks or pedal edema.Telemetry: SB 50s-SR 60s  Gastrointestinal system: Abdomen is nondistended, soft and nontender. Normal bowel sounds heard. Central nervous system: Alert and oriented. No focal  neurological deficits. Extremities: Symmetric 5 x 5 power.   Data Review   Micro Results Recent Results (from the past 240 hour(s))  MRSA PCR Screening     Status: None   Collection Time: 01/01/15  3:10 PM  Result Value Ref Range Status   MRSA by PCR NEGATIVE NEGATIVE Final    Comment:        The GeneXpert MRSA Assay (FDA approved for NASAL specimens only), is one component of a comprehensive MRSA colonization surveillance program. It is not intended to diagnose MRSA infection nor to guide or monitor treatment for MRSA infections.   Urine culture     Status: None   Collection Time: 01/01/15  4:46 PM  Result Value Ref Range Status   Specimen Description URINE, CLEAN CATCH  Final   Special Requests NONE  Final   Culture MULTIPLE SPECIES PRESENT, SUGGEST RECOLLECTION  Final   Report Status 01/03/2015 FINAL  Final    Radiology Reports Dg Chest 2 View  01/01/2015  CLINICAL DATA:  Shortness of Breath EXAM: CHEST  2  VIEW COMPARISON:  December 28, 2014 chest radiograph and CT of the neck and upper chest September 21, 2014 FINDINGS: There remain small pleural effusions bilaterally with bibasilar atelectasis. There is a drainage catheter on the left, unchanged in position. No pneumothorax. Heart is enlarged with pulmonary vascular within normal limits. The large right substernal mass, noted previously to represent marked thyroid enlargement, is again noted and stable. No adenopathy appreciable. IMPRESSION: The marked enlargement of the right lobe of the thyroid is again noted. There are small pleural effusions bilaterally with bibasilar atelectasis. In comparison with recent chest radiograph, there is slightly less atelectasis and effusion on the left and right sides. No new opacity. No change in cardiac silhouette. Electronically Signed   By: [name] III M.D.   On: 01/01/2015 12:48   Dg Chest 2 View  12/28/2014  CLINICAL DATA:  Pleural effusion. EXAM: CHEST  2 VIEW COMPARISON:  12/11/2014.  11/16/2014.  CT 09/21/2014 FINDINGS: Stable right paratracheal substernal mass consistent previously thyroid lesion. Stable cardiomegaly. No focal acute infiltrate. Scratch Stable small right pleural effusion. Stable small left pleural effusion. Right chest tube in stable position. No pneumothorax. Surgical clips noted over the right chest. IMPRESSION: 1. Right chest tube in stable position. Stable bilateral pleural effusions. 2. Scratched stable large right paratracheal thyroid mass. Electronically Signed   By: Marcello Moores  Register   On: 12/28/2014 12:32   Dg Chest 2 View  12/11/2014  CLINICAL DATA:  Followup pleural effusions. Breast carcinoma. Goiter. EXAM: CHEST  2 VIEW COMPARISON:  11/16/2014 and previous CT on 09/19/2014 FINDINGS: Right chest tube remains in place. Small right pleural effusion shows mild decrease in size since previous study. No pneumothorax visualized. Small left pleural effusion remains stable. Heart size  remains stable. Large right mediastinal mass is unchanged and consistent with substernal goiter shown on recent CT. IMPRESSION: Mild decrease in right pleural effusion. No pneumothorax visualized. Stable small left pleural effusion. Stable mediastinal mass, consistent with substernal goiter showed on recent CT. Electronically Signed   By: Earle Gell M.D.   On: 12/11/2014 14:38   Dg Chest Left Decubitus  01/03/2015  CLINICAL DATA:  Pleural effusion. EXAM: CHEST - LEFT DECUBITUS COMPARISON:  January 01, 2015. FINDINGS: Small free flowing left pleural effusion is noted. Stable large amount of right thyroid lobe. Pleural drainage catheter is noted on the right. IMPRESSION: Small free flowing left pleural effusion is noted. Electronically Signed  By: Marijo Conception, M.D.   On: 01/03/2015 15:28   US Renal  01/01/2015  CLINICAL DATA:  Acute renal failure.  Diabetes. EXAM: RENAL / URINARY TRACT ULTRASOUND COMPLETE COMPARISON:  None. FINDINGS: Right Kidney: Length: 9.1 cm.  Diffusely echogenic.  No hydronephrosis. Left Kidney: Length: 9.4 cm. Diffusely echogenic. No hydronephrosis. Several tiny cysts. Bladder: Appears normal for degree of bladder distention. A small amount of free peritoneal fluid is noted. IMPRESSION: 1. Diffusely echogenic kidneys, compatible with medical renal disease. 2. No hydronephrosis. 3. Small amount of ascites. Electronically Signed   By: Claudie Revering M.D.   On: 01/01/2015 16:25   Ct Biopsy  01/04/2015  CLINICAL DATA:  81 year old female with a history of monoclonal gammopathy. Presents for bone marrow biopsy EXAM: CT-GUIDED BIOPSY bone marrow MEDICATIONS AND MEDICAL HISTORY: Versed 0.5 mg, Fentanyl 25 mcg. Additional Medications: None. ANESTHESIA/SEDATION: Moderate sedation time: 15 minutes PROCEDURE: The procedure risks, benefits, and alternatives were explained to the patient. Questions regarding the procedure were encouraged and answered. The patient understands and consents to  the procedure. Scout CT of the pelvis was performed for surgical planning purposes. The posterior pelvis was prepped with Betadinein a sterile fashion, and a sterile drape was applied covering the operative field. A sterile gown and sterile gloves were used for the procedure. Local anesthesia was provided with 1% Lidocaine. We targeted the right posterior iliac bone for biopsy. The skin and subcutaneous tissues were infiltrated with 1% lidocaine without epinephrine. A small stab incision was made with an 11 blade scalpel, and an 11 gauge Murphy needle was advanced with CT guidance to the posterior cortex. Manual forced was used to advance the needle through the posterior cortex and the stylet was removed. A bone marrow aspirate was retrieved and passed to a cytotechnologist in the room. The Murphy needle was then advanced without the stylet for a core biopsy. The core biopsy was retrieved and also passed to a cytotechnologist. Manual pressure was used for hemostasis and a sterile dressing was placed. No complications were encountered no significant blood loss was encountered. Patient tolerated the procedure well and remained hemodynamically stable throughout. FINDINGS: Scout image demonstrates safe approach to posterior iliac bone. Images during the case demonstrate placement of 11 gauge Murphy needle COMPLICATIONS: None IMPRESSION: Status post CT-guided bone marrow biopsy, with tissue specimen sent to pathology for complete histopathologic analysis Signed, Dulcy Fanny. Earleen Newport, DO [facility] Radiology Electronically Signed   By: Corrie Mckusick D.O.   On: 01/04/2015 10:35   Dg Chest Port 1 View  01/04/2015  CLINICAL DATA:  Right pneumothorax. EXAM: PORTABLE CHEST 1 VIEW COMPARISON:  Left lateral decubitus chest radiograph and bone survey 01/03/2015. Chest radiographs 01/01/2015. FINDINGS: Right superior mediastinal mass is again noted consistent with markedly enlarged  thyroid. The cardiac silhouette remains mildly enlarged. Mild central pulmonary vascular congestion remains. A right-sided pleural catheter remains in place. There are persistent small bilateral pleural effusions, unchanged from 01/01/2015. Patchy bibasilar opacities also do not appear significantly changed. No pneumothorax is identified. IMPRESSION: 1. Unchanged small bilateral pleural effusions and bibasilar atelectasis. 2. No pneumothorax identified. Electronically Signed   By: Logan Bores M.D.   On: 01/04/2015 07:37   Dg Bone Survey Met  01/03/2015  ADDENDUM REPORT: 01/03/2015 13:51 ADDENDUM: Critical Value/emergent results were discussed with the patient's provider Paityn Balsam on 01/03/2015 at time 1:50 pm. Electronically Signed   By: Nolon Nations M.D.   On: 01/03/2015 13:51  01/03/2015  CLINICAL DATA:  History of MGUS with worsening renal function. Shortness of breath. Pain all over body. History of breast cancer. EXAM: METASTATIC BONE SURVEY COMPARISON:  07/27/2014 bone scan, PET-CT 09/18/2014, and metastatic survey 12/06/2012 FINDINGS: No lytic or blastic lesions are identified to suggest metastases or multiple myeloma deposits. Patient is a edentulous. Degenerative changes are seen in the cervical, thoracic, and lumbar spine. Significant disc height loss and sclerosis identified at L3-4. Previous ORIF of right distal tibia and fibular fractures. Small probable bone island identified within the distal third of the left radius. The left radius was not imaged previously. Superior mediastinal mass consistent with enlarged thyroid as seen on prior studies. There has been development of lucency at the right lung base. Patient has a right pleural catheter. There is small right pleural effusion. Suspect interval development of right basilar pneumothorax. Small left pleural effusion is present. There is left lower lobe infiltrate, stable in appearance. Status post bilateral mastectomy and bilateral  axillary node dissection. IMPRESSION: 1. Interval development of right basilar pneumothorax. 2. Persistent small right pleural effusion. 3. Persistent left lower lobe infiltrate and pleural effusion. 4. Large right mediastinal mass stable and consistent with enlarged thyroid. 5. No suspicious lytic or blastic lesions. Critical Value/emergent results will be telephoned to the patient's provider [facility] at the time of interpretation on date 01/03/2015 at time 1:44 pm. Electronically Signed: By: Nolon Nations M.D. On: 01/03/2015 13:44     CBC  Recent Labs Lab 01/01/15 1125 01/03/15 0341 01/04/15 0215  WBC 4.8 5.0 5.9  HGB 8.9* 8.4* 7.9*  HCT 28.3* 26.3* 24.9*  PLT 319 287 319  MCV 86.3 86.5 85.9  MCH 27.1 27.6 27.2  MCHC 31.4 31.9 31.7  RDW 17.3* 17.0* 16.7*  LYMPHSABS  --   --  0.8  MONOABS  --   --  0.7  EOSABS  --   --  0.3  BASOSABS  --   --  0.0    Chemistries   Recent Labs Lab 01/01/15 1125 01/02/15 0327 01/03/15 0341 01/04/15 0215  NA 139 141 134* 135  K 5.9* 5.1 4.7 4.4  CL 110 110 104 104  CO2 20* 20* 21* 23  GLUCOSE 127* 112* 108* 106*  BUN 159* 159* 159* 156*  CREATININE 6.99* 6.58* 6.13* 6.01*  CALCIUM 8.9 8.8* 8.5* 8.8*  AST 28  --   --   --   ALT 30  --   --   --   ALKPHOS 93  --   --   --   BILITOT 0.3  --   --   --    ------------------------------------------------------------------------------------------------------------------ estimated creatinine clearance is 6.7 mL/min (by C-G formula based on Cr of 6.01). ------------------------------------------------------------------------------------------------------------------ No results for input(s): HGBA1C in the last 72 hours. ------------------------------------------------------------------------------------------------------------------ No results for input(s): CHOL, HDL, LDLCALC, TRIG, CHOLHDL, LDLDIRECT in the last 72  hours. ------------------------------------------------------------------------------------------------------------------  Recent Labs  01/01/15 1535 01/02/15 1520  TSH <0.010*  --   T3FREE  --  2.3   ------------------------------------------------------------------------------------------------------------------  Recent Labs  01/01/15 1535  FERRITIN 26  TIBC 277  IRON 20*    Coagulation profile  Recent Labs Lab 01/01/15 1535 01/02/15 0327 01/03/15 0341 01/04/15 0215  INR 5.88* 5.73* 5.15* 1.91*    No results for input(s): DDIMER in the last 72 hours.  Cardiac Enzymes No results for input(s): CKMB, TROPONINI, MYOGLOBIN in the last 168 hours.  Invalid input(s): CK ------------------------------------------------------------------------------------------------------------------ Invalid input(s): POCBNP     Time Spent in  minutes   40 minutes   Kinte Trim M.D on 01/04/2015 at 10:53 AM  Between 7am to 7pm - Pager - [phone]  After 7pm go to [email or web address] - password [facility]  [facility]   Office  [phone]

## 2015-01-04 NOTE — Procedures (Signed)
Interventional Radiology Procedure Note  Procedure: CT guided aspirate and core biopsy of right posterior iliac bone Complications: None Recommendations: - Bedrest supine x1 hrs   - Follow biopsy results  Signed,  Dulcy Fanny. Earleen Newport, DO

## 2015-01-04 NOTE — Care Management Important Message (Signed)
Important Message  Patient Details  Name: Alison Gross MRN: [id number] Date of Birth: [birth year]   Medicare Important Message Given:  Yes-second notification given    [name] May 01/04/2015, 10:31 AM

## 2015-01-04 NOTE — Consult Note (Signed)
Alison Gross  [phone]) [phone] Fax:([phone]     ID: Alison Gross DOB: [birth year]  MR#: [id number]  HWE#:[id number]  Patient Care Team: Alison Chard, MD as PCP - General (Internal Medicine) Alison David, RN as [place] Management Alison Prows, MD as Consulting Physician (Cardiology) Alison Gaines, LCSW as [place] (Licensed Clinical Social Worker) PCP: Alison Greenland, MD:  OTHER MD: Alison Oh MD  CHIEF COMPLAINT: progressive renal failure, M-GUS, breast cancer in remission  CURRENT TREATMENT:    BREAST CANCER HISTORY: From the original intake note:  Alison Gross is status post excision of a left breast fibroadenoma 02/09/2001. More recently, she had routine screening mammography (her first in 10 years) at the breast Center 03/25/2012. This showed left breast calcifications and bilateral breast masses. Additional views including ultrasonography 04/12/2012 showed, on the left, 2 separate masses and a third area of coarse calcifications. A palpable mass was noted at the 3:00 position. By ultrasound a solid and cystic mass was noted in that area measuring 2.7 cm. There was also a 1.5 cm cyst. The left axilla appeared benign.  On the right, there was a high density round mass with ill-defined margins in the lower inner quadrant. This was palpable. By ultrasonography it measured 3.5 cm. The right axilla appeared unremarkable.   Biopsy of the 3:00 mass in the left breast 04/12/2012 showed benign breast tissue but a second left breast needle core biopsy obtained 04/16/2012 showed invasive ductal carcinoma, grade 1, with significant mucin. This tumor was estrogen receptor 99% positive, progesterone receptor negative, with an MIB-1 of 11%, and no HER-2 amplification.  Biopsy of the right breast mass 04/12/2012, showed an invasive ductal carcinoma, grade 2, with scant mucin, estrogen receptor 100% positive,  progesterone receptor 36% positive, with an MIB-1 of 32%, and no HER-2 amplification.  The patient's subsequent breast cancer history is as detailed below.  HISTORY OF M-GUS: Alison Gross was referred to nephrology May 2014 for evaluation of CKD stage 3. His w/u 09/26/2012 included an SPEP which showed an M-spike of 1.05 with increased free lambda light chains (21.4, ratio k/l 0.1). Bone survey 12/06/2012 showed no myeloma lesions. This was c/w a diagnosis of M-GUS and it was felt this was not the driver of the patient's CKD.a Alison Gross's patient's protein profile has been followed, w/o significant progression. On 01/18/2014 her M-spike was 1.18 and free lambda light chains 25.1  More recently, the patient's renal function has continued to deteriorate. Repeat SPEPs were obtained in the August 2016 and current admission, showing a stable M-spike (0.6, 0.7) but rising lambda free light chains (157.14, 296.39). Note that the total IgG and IgA are WNL. This pattern suggests the rise in lambda light chains is more likely due to decreased excretion than increased production (otherwise the M-spike should have risen as well); however subclone progression to a light-chain myeloma would also be consistent with this pattern. Accordingly nephrology has suggested a bone marrow biopsy and we have added a repeat bone survey to assessfor possible progression to myeloma.  INTERVAL HISTORY: Alison Gross was re-admitted 01/01/2015 with worsening SOB and functional status. She was found to have a creat of nearly 7.0 and while there are many competing causes for the patient's acute on chronic renal injury (see renal's note 01/01/2015) the possibility of amyloidosis was suggested. The patient is being considered for HD and today she tells me "if that's my only choice" she would accept it  REVIEW OF SYSTEMS: [name]'s  functional status at home has been very restricted. She goes "from one room to the next." She is always fatigued. She does not do  any housework. Her appetite is poor and she has "lost a sense of taste." She is not in pain. She is very short-winded. There have been no intercurrent fevers or bleeding. No family in room.  PAST MEDICAL HISTORY: Past Medical History  Diagnosis Date  . Hypercholesterolemia     takes Crestor daily  . Thyroid disease     had iodine radiation  . Hypertension     takes Hyzaar daily  . Dysrhythmia     takes Carvedilol daily  . Pneumonia     history of;last time about 4-67yr ago  . GERD (gastroesophageal reflux disease)     takes Protonix daily  . Gastric ulcer   . History of blood transfusion   . History of colon polyps   . Anemia     takes iron pill daily  . Diabetes mellitus without complication ([facility]     takes Tradjenta daily  . History of radiation therapy 07/12/12-08/26/12    right breast/  . Paroxysmal atrial fibrillation ([facility]     PCP EKG 09/27/2013: A. Fibrillation.. EKG 09/29/2013: S. Tach.  . Breast cancer ([facility] 04/12/12    right-pos lymph node/left-DCIS  . Shortness of breath on exertion   . Bruit of left carotid artery   . Acute upper GI bleeding 01/24/2012  . MGUS (monoclonal gammopathy of unknown significance) 04/21/2013  . Osteoporosis 11/29/2013  . Recurrent right pleural effusion 07/26/2014  . Stage III chronic kidney disease 07/26/2014  . Goiter 07/27/2014  . Substernal goiter 12/23/2012  . Pleural effusion, right 12/23/2012  . Type 2 diabetes mellitus with renal manifestations ([facility] 07/26/2014  . Dysphonia 09/20/2014  . Vocal cord paralysis     Suspected due to massive substernal goiter  . CHF (congestive heart failure) ([facility] [place]     PAST SURGICAL HISTORY: Past Surgical History  Procedure Laterality Date  . Carpal tunnel release Left   . Breast biopsy    . Esophagogastroduodenoscopy  01/24/2012    Procedure: ESOPHAGOGASTRODUODENOSCOPY (EGD);  Surgeon: MJeryl Columbia MD;  [facility];  Service: Endoscopy;  Laterality: N/A;  . Thyroid surgery      "removed  goiter"  . Cystectomy      from back of neck  . Knee surgery      right with rods  . Esophagogastroduodenoscopy    . Colonoscopy    . Total mastectomy Bilateral 05/10/2012    Procedure: RIGHT modified mastectomy; LEFT total mastectomy;  Surgeon: CHaywood Lasso MD;  [facility]  Service: General;  Laterality: Bilateral;  . Axillary sentinel node biopsy Right 05/10/2012    Procedure: AXILLARY SENTINEL lymph  NODE BIOPSY;  Surgeon: CHaywood Lasso MD;  [facility]  Service: General;  Laterality: Right;  nuclear medicine injection right side  7:00 am   . Abdominal hysterectomy      fibroids, with bilateral SO  . Chest tube insertion Right 09/27/2014    Procedure: INSERTION OF RIGHT PLEURAL DRAINAGE CATHETER;  Surgeon: EGrace Isaac MD;  [facility];  Service: Thoracic;  Laterality: Right;    FAMILY HISTORY Family History  Problem Relation Age of Onset  . Brain cancer Mother   . Aneurysm Father   . Diabetes Sister   . Diabetes Brother   . Diabetes Sister   . Diabetes Brother   . Heart failure Sister  the patient's father died at the age of 79 from a ruptured (cerebral?) aneurysm; the patient's mother died at the age of 41, but the patient does not know the cause. Ms. Coletta has 3 brothers, one sister. There is no history of breast or ovarian cancer in the family to her knowledge.  GYNECOLOGIC HISTORY:  No LMP recorded. Patient has had a hysterectomy. Menarche age 19, first live birth age 88, she is [place] P2. She underwent hysterectomy in her 47s. 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 Her husband Alison Gross is a Company secretary of a [employer] on [name] Daughter Alison Gross is studying healthcare counseling at [employer]. Daughter Alison Gross Is an Investment banker, operational in [place]. The patient has 1 grandchild.   HEALTH MAINTENANCE: Social History    Substance Use Topics  . Smoking status: Former Smoker -- 0.00 packs/day    Types: Cigarettes    Quit date: 07/26/1971  . Smokeless tobacco: Never Used  . Alcohol Use: No      No Known Allergies  Current Facility-Administered Medications  Medication Dose Route Frequency Provider Last Rate Last Dose  . 0.9 %  sodium chloride infusion  250 mL Intravenous PRN Melton Alar, PA-C      . acetaminophen (TYLENOL) tablet 650 mg  650 mg Oral Q4H PRN Melton Alar, PA-C   650 mg at 01/01/15 2153  . albuterol (PROVENTIL) (2.5 MG/3ML) 0.083% nebulizer solution 3 mL  3 mL Inhalation Q4H PRN Melton Alar, PA-C      . amiodarone (PACERONE) tablet 200 mg  200 mg Oral Daily Melton Alar, PA-C   200 mg at 01/03/15 0919  . anastrozole (ARIMIDEX) tablet 1 mg  1 mg Oral Daily Melton Alar, PA-C   1 mg at 01/03/15 0919  . Darbepoetin Alfa (ARANESP) injection 100 mcg  100 mcg Subcutaneous Q Tue-1800 Fleet Contras, MD   100 mcg at 01/02/15 1751  . feeding supplement (ENSURE ENLIVE) (ENSURE ENLIVE) liquid 237 mL  237 mL Oral BID BM Marianne L York, PA-C   237 mL at 01/03/15 1604  . ferumoxytol (FERAHEME) 510 mg in sodium chloride 0.9 % 100 mL IVPB  510 mg Intravenous Weekly Fleet Contras, MD   510 mg at 01/02/15 1200  . furosemide (LASIX) 160 mg in dextrose 5 % 50 mL IVPB  160 mg Intravenous 3 times per day Fleet Contras, MD   160 mg at 01/04/15 0546  . insulin aspart (novoLOG) injection 0-9 Units  0-9 Units Subcutaneous TID WC Melton Alar, PA-C   1 Units at 01/03/15 0254  . methimazole (TAPAZOLE) tablet 20 mg  20 mg Oral Daily Albertine Patricia, MD   20 mg at 01/03/15 1410  . ondansetron (ZOFRAN) injection 4 mg  4 mg Intravenous Q6H PRN Melton Alar, PA-C      . sodium chloride 0.9 % injection 3 mL  3 mL Intravenous Q12H Marianne L York, PA-C   3 mL at 01/03/15 2300  . sodium chloride 0.9 % injection 3 mL  3 mL Intravenous PRN Melton Alar, PA-C      . thiamine (VITAMIN B-1)  tablet 100 mg  100 mg Oral Daily Melton Alar, PA-C   100 mg at 01/03/15 0919  . Warfarin - Pharmacist Dosing Inpatient   Does not apply [id number] Albertine Patricia, MD   Stopped at  01/03/15 1800    OBJECTIVE: elderly African American Gross exmained in bed Filed Vitals:   01/04/15 0400  BP: 111/47  Pulse: 76  Temp: 98.5 F (36.9 C)  Resp: 17     Body mass index is 21.61 kg/(m^2).    ECOG FS:3 - Symptomatic, >50% confined to bed  Ocular: Sclerae unicteric Ear-nose-throat: Oropharynx clear  Lymphatic: No cervical or supraclavicular adenopathy Lungs few rales bilaterally, auscultated anterolaterally Heart regular rate and rhythm Abd soft, positive bowel sounds Neuro: non-focal, well-oriented (not formally tested), appropriate affect Breasts: deferred   LAB RESULTS:  CMP     Component Value Date/Time   NA 135 01/04/2015 0215   NA 143 09/18/2014 0946   K 4.4 01/04/2015 0215   K 4.0 09/18/2014 0946   CL 104 01/04/2015 0215   CL 107 08/05/2012 1009   CO2 23 01/04/2015 0215   CO2 30* 09/18/2014 0946   GLUCOSE 106* 01/04/2015 0215   GLUCOSE 94 09/18/2014 0946   GLUCOSE 128* 08/05/2012 1009   BUN 156* 01/04/2015 0215   BUN 31.2* 09/18/2014 0946   CREATININE 6.01* 01/04/2015 0215   CREATININE 1.7* 09/18/2014 0946   CALCIUM 8.8* 01/04/2015 0215   CALCIUM 11.8* 09/18/2014 0946   CALCIUM 10.7* 07/27/2014 0830   PROT 5.5* 01/01/2015 1125   PROT 6.8 09/18/2014 0946   ALBUMIN 2.2* 01/04/2015 0215   ALBUMIN 3.3* 09/18/2014 0946   AST 28 01/01/2015 1125   AST 27 09/18/2014 0946   ALT 30 01/01/2015 1125   ALT 18 09/18/2014 0946   ALKPHOS 93 01/01/2015 1125   ALKPHOS 78 09/18/2014 0946   BILITOT 0.3 01/01/2015 1125   BILITOT 0.68 09/18/2014 0946   GFRNONAA 6* 01/04/2015 0215   GFRAA 7* 01/04/2015 0215    INo results found for: SPEP, UPEP  Lab Results  Component Value Date   WBC 5.9 01/04/2015   NEUTROABS 4.1 01/04/2015   HGB 7.9* 01/04/2015   HCT 24.9* 01/04/2015    MCV 85.9 01/04/2015   PLT 319 01/04/2015    @LASTCHEMISTRY @  Lab Results  Component Value Date   LABCA2 18 04/21/2012    No components found for: LABCA125   Recent Labs Lab 01/04/15 0215  INR 1.91*    Urinalysis    Component Value Date/Time   COLORURINE YELLOW 01/01/2015 [place] 01/01/2015 1646   LABSPEC 1.013 01/01/2015 1646   PHURINE 5.0 01/01/2015 1646   GLUCOSEU NEGATIVE 01/01/2015 1646   HGBUR NEGATIVE 01/01/2015 [place] 01/01/2015 1646   KETONESUR NEGATIVE 01/01/2015 1646   PROTEINUR NEGATIVE 01/01/2015 1646   UROBILINOGEN 0.2 01/01/2015 1646   NITRITE NEGATIVE 01/01/2015 1646   LEUKOCYTESUR TRACE* 01/01/2015 1646    STUDIES: Dg Chest 2 View  01/01/2015  CLINICAL DATA:  Shortness of Breath EXAM: CHEST  2 VIEW COMPARISON:  December 28, 2014 chest radiograph and CT of the neck and upper chest September 21, 2014 FINDINGS: There remain small pleural effusions bilaterally with bibasilar atelectasis. There is a drainage catheter on the left, unchanged in position. No pneumothorax. Heart is enlarged with pulmonary vascular within normal limits. The large right substernal mass, noted previously to represent marked thyroid enlargement, is again noted and stable. No adenopathy appreciable. IMPRESSION: The marked enlargement of the right lobe of the thyroid is again noted. There are small pleural effusions bilaterally with bibasilar atelectasis. In comparison with recent chest radiograph, there is slightly less atelectasis and effusion on the left and right sides. No new opacity.  No change in cardiac silhouette. Electronically Signed   By: [name] III M.D.   On: 01/01/2015 12:48   Dg Chest 2 View  12/28/2014  CLINICAL DATA:  Pleural effusion. EXAM: CHEST  2 VIEW COMPARISON:  12/11/2014.  11/16/2014.  CT 09/21/2014 FINDINGS: Stable right paratracheal substernal mass consistent previously thyroid lesion. Stable cardiomegaly. No focal  acute infiltrate. Scratch Stable small right pleural effusion. Stable small left pleural effusion. Right chest tube in stable position. No pneumothorax. Surgical clips noted over the right chest. IMPRESSION: 1. Right chest tube in stable position. Stable bilateral pleural effusions. 2. Scratched stable large right paratracheal thyroid mass. Electronically Signed   By: Marcello Moores  Register   On: 12/28/2014 12:32   Dg Chest 2 View  12/11/2014  CLINICAL DATA:  Followup pleural effusions. Breast carcinoma. Goiter. EXAM: CHEST  2 VIEW COMPARISON:  11/16/2014 and previous CT on 09/19/2014 FINDINGS: Right chest tube remains in place. Small right pleural effusion shows mild decrease in size since previous study. No pneumothorax visualized. Small left pleural effusion remains stable. Heart size remains stable. Large right mediastinal mass is unchanged and consistent with substernal goiter shown on recent CT. IMPRESSION: Mild decrease in right pleural effusion. No pneumothorax visualized. Stable small left pleural effusion. Stable mediastinal mass, consistent with substernal goiter showed on recent CT. Electronically Signed   By: Earle Gell M.D.   On: 12/11/2014 14:38   Dg Chest Left Decubitus  01/03/2015  CLINICAL DATA:  Pleural effusion. EXAM: CHEST - LEFT DECUBITUS COMPARISON:  January 01, 2015. FINDINGS: Small free flowing left pleural effusion is noted. Stable large amount of right thyroid lobe. Pleural drainage catheter is noted on the right. IMPRESSION: Small free flowing left pleural effusion is noted. Electronically Signed   By: Marijo Conception, M.D.   On: 01/03/2015 15:28   US Renal  01/01/2015  CLINICAL DATA:  Acute renal failure.  Diabetes. EXAM: RENAL / URINARY TRACT ULTRASOUND COMPLETE COMPARISON:  None. FINDINGS: Right Kidney: Length: 9.1 cm.  Diffusely echogenic.  No hydronephrosis. Left Kidney: Length: 9.4 cm. Diffusely echogenic. No hydronephrosis. Several tiny cysts. Bladder: Appears normal for  degree of bladder distention. A small amount of free peritoneal fluid is noted. IMPRESSION: 1. Diffusely echogenic kidneys, compatible with medical renal disease. 2. No hydronephrosis. 3. Small amount of ascites. Electronically Signed   By: Claudie Revering M.D.   On: 01/01/2015 16:25   Dg Bone Survey Met  01/03/2015  ADDENDUM REPORT: 01/03/2015 13:51 ADDENDUM: Critical Value/emergent results were discussed with the patient's provider DAWOOD ELGERGAWY on 01/03/2015 at time 1:50 pm. Electronically Signed   By: Nolon Nations M.D.   On: 01/03/2015 13:51  01/03/2015  CLINICAL DATA:  History of MGUS with worsening renal function. Shortness of breath. Pain all over body. History of breast cancer. EXAM: METASTATIC BONE SURVEY COMPARISON:  07/27/2014 bone scan, PET-CT 09/18/2014, and metastatic survey 12/06/2012 FINDINGS: No lytic or blastic lesions are identified to suggest metastases or multiple myeloma deposits. Patient is a edentulous. Degenerative changes are seen in the cervical, thoracic, and lumbar spine. Significant disc height loss and sclerosis identified at L3-4. Previous ORIF of right distal tibia and fibular fractures. Small probable bone island identified within the distal third of the left radius. The left radius was not imaged previously. Superior mediastinal mass consistent with enlarged thyroid as seen on prior studies. There has been development of lucency at the right lung base. Patient has a right pleural catheter. There is small right pleural effusion. Suspect  interval development of right basilar pneumothorax. Small left pleural effusion is present. There is left lower lobe infiltrate, stable in appearance. Status post bilateral mastectomy and bilateral axillary node dissection. IMPRESSION: 1. Interval development of right basilar pneumothorax. 2. Persistent small right pleural effusion. 3. Persistent left lower lobe infiltrate and pleural effusion. 4. Large right mediastinal mass stable and  consistent with enlarged thyroid. 5. No suspicious lytic or blastic lesions. Critical Value/emergent results will be telephoned to the patient's provider DAWOOD [facility] at the time of interpretation on date 01/03/2015 at time 1:44 pm. Electronically Signed: By: Nolon Nations M.D. On: 01/03/2015 13:44    ASSESSMENT: 81 y.o. Alison Gross  (1) status post bilateral mastectomies 05/10/2012, showing (a) on the left side, low-grade ductal carcinoma in situ; earlier biopsy had shown multiple areas of concern, one of which was invasive ductal carcinoma, grade 1, with abundant mucin, estrogen receptor positive, progesterone receptor negative, with an MIB-1 of 10, and no HER-2 amplification. (b) on the right side, a pT2 pN1, stage IIB, invasive mucinous/ductal carcinoma, grade 1, estrogen and progesterone receptor positive, HER-2 negative, with an MIB-1 of 32%.  (2) opted against adjuvant chemotherapy  (3) adjuvant radiation, completed 08/26/2012  (4) started tamoxifen in late June 2014, discontinued due to coagulation concerns; started anastrozole 11/29/2013  (a) bone density 09/04/2013 showed osteoporosis (b) started yearly zolendronate 02/20/2014  (5) no reconstruction planned  (6)M-GUS diagnosed July 2017 with a low M-Spike and free lambda light chains  (a) negative bone survey 09/29/2014and 01/03/2015  (b) increase in free lambda light chains but not M-spike noted (see discussion above) c/w decreased excretion or progression to light-chain myeloma  (c) bone marrow biopsy pending   PLAN: The M-spike level and negative bone survey are c/w M-GUS. The patient does not meet criteria for myeloma at this point.  I think Ismerai's increased lambda light chains are going to be secondary to the worsening renal function and not its cause. The bone marrow biopsy will be helpful but of course not definitive. While we could treat the M-GUS  (lenalidomide, bortezomib) I am not sure it would make any difference to her kidney problems which are driven by her history of HTN and DM and diastolic dysfunction likely related to her large goiter.  I think a palliative care consult may be a good idea to discuss goals of care as the patient considers undergoing HD.  Greatly appreciate your help to this patient! I am going to be out of town until 10/31, will follow with you at that time. If you need our input before then please consult my partners.    Chauncey Cruel, MD   01/04/2015 7:16 AM [facility], [facility] [address] [place], [place] [address] Tel. [phone]    Fax. [phone]

## 2015-01-04 NOTE — Progress Notes (Signed)
Advanced Heart Failure Rounding Note   Subjective:    Admitted with volume overload in the setting of worsening renal a function.   Now on high dose lasix. Urine output improved. Weight coming down but BUN/CR unchanged. +nausea. Has decided to proceed with HD.  For BM biopsy today.   Denies SOB.   Renal u/s with small kidneys and significant medico-renal disease  Echo EF 60-65% + LVH small effusion    Objective:   Weight Range:  Vital Signs:   Temp:  [97.9 F (36.6 C)-99 F (37.2 C)] 99 F (37.2 C) (10/27 0746) Pulse Rate:  [64-76] 73 (10/27 0746) Resp:  [15-17] 17 (10/27 0746) BP: (92-111)/(40-47) 95/40 mmHg (10/27 0746) SpO2:  [99 %-100 %] 100 % (10/27 0746) Weight:  [58.9 kg (129 lb 13.6 oz)] 58.9 kg (129 lb 13.6 oz) (10/27 0545) Last BM Date: 01/01/15  Weight change: Filed Weights   01/02/15 0500 01/03/15 0731 01/04/15 0545  Weight: 60.8 kg (134 lb 0.6 oz) 60.283 kg (132 lb 14.4 oz) 58.9 kg (129 lb 13.6 oz)    Intake/Output:   Intake/Output Summary (Last 24 hours) at 01/04/15 0902 Last data filed at 01/04/15 0753  Gross per 24 hour  Intake    185 ml  Output   2300 ml  Net  -2115 ml     Physical Exam: General:   No resp difficulty HEENT: normal Neck: supple. JVP to jaw Carotids 2+ bilat; no bruits. No lymphadenopathy or thryomegaly appreciated. Cor: PMI nondisplaced. Regular rate & rhythm. No rubs, gallops or murmurs. Lungs: clear Abdomen: soft, nontender, nondistended. No hepatosplenomegaly. No bruits or masses. Good bowel sounds. Extremities: no cyanosis, clubbing, rash, R and LLE 2+extends thighs.  Neuro: alert & orientedx3, cranial nerves grossly intact. moves all 4 extremities w/o difficulty. Affect pleasant  Telemetry: Sinus Rhythm 60s    Labs: Basic Metabolic Panel:  Recent Labs Lab 01/01/15 1125 01/02/15 0327 01/03/15 0341 01/04/15 0215  NA 139 141 134* 135  K 5.9* 5.1 4.7 4.4  CL 110 110 104 104  CO2 20* 20* 21* 23  GLUCOSE  127* 112* 108* 106*  BUN 159* 159* 159* 156*  CREATININE 6.99* 6.58* 6.13* 6.01*  CALCIUM 8.9 8.8* 8.5* 8.8*  PHOS  --  7.1* 7.5* 7.2*    Liver Function Tests:  Recent Labs Lab 01/01/15 1125 01/02/15 0327 01/03/15 0341 01/04/15 0215  AST 28  --   --   --   ALT 30  --   --   --   ALKPHOS 93  --   --   --   BILITOT 0.3  --   --   --   PROT 5.5*  --   --   --   ALBUMIN 2.6* 2.2* 2.4* 2.2*   No results for input(s): LIPASE, AMYLASE in the last 168 hours. No results for input(s): AMMONIA in the last 168 hours.  CBC:  Recent Labs Lab 01/01/15 1125 01/03/15 0341 01/04/15 0215  WBC 4.8 5.0 5.9  NEUTROABS  --   --  4.1  HGB 8.9* 8.4* 7.9*  HCT 28.3* 26.3* 24.9*  MCV 86.3 86.5 85.9  PLT 319 287 319    Cardiac Enzymes: No results for input(s): CKTOTAL, CKMB, CKMBINDEX, TROPONINI in the last 168 hours.  BNP: BNP (last 3 results)  Recent Labs  09/28/14 0238 11/07/14 1207 01/01/15 1125  BNP 947.8* 1275.4* 2364.3*    ProBNP (last 3 results) No results for input(s): PROBNP in the last 8760  hours.    Other results:  Imaging: Dg Chest Left Decubitus  01/03/2015  CLINICAL DATA:  Pleural effusion. EXAM: CHEST - LEFT DECUBITUS COMPARISON:  January 01, 2015. FINDINGS: Small free flowing left pleural effusion is noted. Stable large amount of right thyroid lobe. Pleural drainage catheter is noted on the right. IMPRESSION: Small free flowing left pleural effusion is noted. Electronically Signed   By: Marijo Conception, M.D.   On: 01/03/2015 15:28   Dg Chest Port 1 View  01/04/2015  CLINICAL DATA:  Right pneumothorax. EXAM: PORTABLE CHEST 1 VIEW COMPARISON:  Left lateral decubitus chest radiograph and bone survey 01/03/2015. Chest radiographs 01/01/2015. FINDINGS: Right superior mediastinal mass is again noted consistent with markedly enlarged thyroid. The cardiac silhouette remains mildly enlarged. Mild central pulmonary vascular congestion remains. A right-sided pleural  catheter remains in place. There are persistent small bilateral pleural effusions, unchanged from 01/01/2015. Patchy bibasilar opacities also do not appear significantly changed. No pneumothorax is identified. IMPRESSION: 1. Unchanged small bilateral pleural effusions and bibasilar atelectasis. 2. No pneumothorax identified. Electronically Signed   By: Logan Bores M.D.   On: 01/04/2015 07:37   Dg Bone Survey Met  01/03/2015  ADDENDUM REPORT: 01/03/2015 13:51 ADDENDUM: Critical Value/emergent results were discussed with the patient's provider DAWOOD ELGERGAWY on 01/03/2015 at time 1:50 pm. Electronically Signed   By: Nolon Nations M.D.   On: 01/03/2015 13:51  01/03/2015  CLINICAL DATA:  History of MGUS with worsening renal function. Shortness of breath. Pain all over body. History of breast cancer. EXAM: METASTATIC BONE SURVEY COMPARISON:  07/27/2014 bone scan, PET-CT 09/18/2014, and metastatic survey 12/06/2012 FINDINGS: No lytic or blastic lesions are identified to suggest metastases or multiple myeloma deposits. Patient is a edentulous. Degenerative changes are seen in the cervical, thoracic, and lumbar spine. Significant disc height loss and sclerosis identified at L3-4. Previous ORIF of right distal tibia and fibular fractures. Small probable bone island identified within the distal third of the left radius. The left radius was not imaged previously. Superior mediastinal mass consistent with enlarged thyroid as seen on prior studies. There has been development of lucency at the right lung base. Patient has a right pleural catheter. There is small right pleural effusion. Suspect interval development of right basilar pneumothorax. Small left pleural effusion is present. There is left lower lobe infiltrate, stable in appearance. Status post bilateral mastectomy and bilateral axillary node dissection. IMPRESSION: 1. Interval development of right basilar pneumothorax. 2. Persistent small right pleural  effusion. 3. Persistent left lower lobe infiltrate and pleural effusion. 4. Large right mediastinal mass stable and consistent with enlarged thyroid. 5. No suspicious lytic or blastic lesions. Critical Value/emergent results will be telephoned to the patient's provider DAWOOD [facility], [place] at the time of interpretation on date 01/03/2015 at time 1:44 pm. Electronically Signed: By: Nolon Nations M.D. On: 01/03/2015 13:44     Medications:     Scheduled Medications: . amiodarone  200 mg Oral Daily  . anastrozole  1 mg Oral Daily  . darbepoetin (ARANESP) injection - NON-DIALYSIS  100 mcg Subcutaneous Q Tue-1800  . feeding supplement (ENSURE ENLIVE)  237 mL Oral BID BM  . ferumoxytol  510 mg Intravenous Weekly  . furosemide  160 mg Intravenous 3 times per day  . insulin aspart  0-9 Units Subcutaneous TID WC  . lidocaine      . methimazole  20 mg Oral Daily  . sodium chloride  3 mL Intravenous Q12H  . thiamine  100 mg  Oral Daily  . Warfarin - Pharmacist Dosing Inpatient   Does not apply q1800    Infusions:    PRN Medications: sodium chloride, acetaminophen, albuterol, ondansetron (ZOFRAN) IV, sodium chloride   Assessment:   1. A/C CKD, stage 5 2. Hyperkalemia 3. A/C Diastolic HF 4. Recurrent pleural effusion on R with pleurex cathter in place 5. A fib  6. H/O Breast Cancer S/P bilateral mastectomies 2014  7. Pericardial effusion     Plan/Discussion:    Decent response to IV lasix but still with volume overload and start to develop some uremic symptoms. ECHO remains with preserved LV function  Suspect major issue is progressive intrinsic renal failure. Likely HTNive in nature but cannot exclude amyloid. Renal moving forward with HD initiation.  Pending BM Bx today to look for myeloma.   Suspect pleural effusion may improve (and Pleurex cath drainage slow down when volume status better controlled)  I discussed  With Dr. Jana Hakim.   Josephyne Tarter,MD 9:02  AM  Advanced Heart Failure Team Pager [phone] (M-F; 7a - 4p)  Please contact [facility] for night-coverage after hours (4p -7a ) and weekends on [email or web address]

## 2015-01-04 NOTE — Progress Notes (Signed)
Received order for vein mapping. This was completed 01/03/15. Please refer to prelim note.  Landry Mellow, RDMS, RVT [facility]

## 2015-01-04 NOTE — Consult Note (Signed)
CONSULT NOTE   MRN : [id number]  Reason for Consult: ESRD CKD stage IV Referring Physician: Dr. Mercy Moore  History of Present Illness: 81 y/o female admitted with SOB and Recurrent exudative pleural effusion s/p pleurx cath.  She came to the ED 01/02/2015 and in the emergency department her creatinine has increased from a baseline of 2.5 to 6.9. Patient denies prior access procedures.  She denies any central venous catheterization or PPM placement.    We have been asked to provide her with a diatek catheter tomorrow and in the near future permanent HD access.  Her current INR in 1.91 at 0215.  Coumadin has been held.       Current Facility-Administered Medications  Medication Dose Route Frequency Provider Last Rate Last Dose  . 0.9 %  sodium chloride infusion  250 mL Intravenous PRN Melton Alar, PA-C      . acetaminophen (TYLENOL) tablet 650 mg  650 mg Oral Q4H PRN Melton Alar, PA-C   650 mg at 01/01/15 2153  . albuterol (PROVENTIL) (2.5 MG/3ML) 0.083% nebulizer solution 3 mL  3 mL Inhalation Q4H PRN Melton Alar, PA-C      . amiodarone (PACERONE) tablet 200 mg  200 mg Oral Daily Melton Alar, PA-C   200 mg at 01/03/15 0919  . anastrozole (ARIMIDEX) tablet 1 mg  1 mg Oral Daily Melton Alar, PA-C   1 mg at 01/03/15 0919  . Darbepoetin Alfa (ARANESP) injection 100 mcg  100 mcg Subcutaneous Q Tue-1800 Fleet Contras, MD   100 mcg at 01/02/15 1751  . feeding supplement (ENSURE ENLIVE) (ENSURE ENLIVE) liquid 237 mL  237 mL Oral BID BM Marianne L York, PA-C   237 mL at 01/03/15 1604  . fentaNYL (SUBLIMAZE) 100 MCG/2ML injection           . ferumoxytol (FERAHEME) 510 mg in sodium chloride 0.9 % 100 mL IVPB  510 mg Intravenous Weekly Fleet Contras, MD   510 mg at 01/02/15 1200  . furosemide (LASIX) 160 mg in dextrose 5 % 50 mL IVPB  160 mg Intravenous 3 times per day Fleet Contras, MD   160 mg at 01/04/15 0546  . insulin aspart (novoLOG) injection 0-9 Units  0-9  Units Subcutaneous TID WC Melton Alar, PA-C   1 Units at 01/03/15 1838  . lidocaine (XYLOCAINE) 1 % (with pres) injection           . methimazole (TAPAZOLE) tablet 20 mg  20 mg Oral Daily Albertine Patricia, MD   20 mg at 01/03/15 1410  . midazolam (VERSED) 2 MG/2ML injection           . ondansetron (ZOFRAN) injection 4 mg  4 mg Intravenous Q6H PRN Melton Alar, PA-C   4 mg at 01/04/15 3888  . sodium chloride 0.9 % injection 3 mL  3 mL Intravenous Q12H Marianne L York, PA-C   3 mL at 01/03/15 2300  . sodium chloride 0.9 % injection 3 mL  3 mL Intravenous PRN Melton Alar, PA-C      . thiamine (VITAMIN B-1) tablet 100 mg  100 mg Oral Daily Melton Alar, PA-C   100 mg at 01/03/15 0919  . Warfarin - Pharmacist Dosing Inpatient   Does not apply [id number] Albertine Patricia, MD   Stopped at 01/03/15 1800    Pt meds include: Statin :No Betablocker: Yes ASA: No Other anticoagulants/antiplatelets: Coumadin on hold A fib  Past Medical History  Diagnosis Date  . Hypercholesterolemia     takes Crestor daily  . Thyroid disease     had iodine radiation  . Hypertension     takes Hyzaar daily  . Dysrhythmia     takes Carvedilol daily  . Pneumonia     history of;last time about 4-50yr ago  . GERD (gastroesophageal reflux disease)     takes Protonix daily  . Gastric ulcer   . History of blood transfusion   . History of colon polyps   . Anemia     takes iron pill daily  . Diabetes mellitus without complication ([facility]     takes Tradjenta daily  . History of radiation therapy 07/12/12-08/26/12    right breast/  . Paroxysmal atrial fibrillation ([facility]     PCP EKG 09/27/2013: A. Fibrillation.. EKG 09/29/2013: S. Tach.  . Breast cancer ([facility] 04/12/12    right-pos lymph node/left-DCIS  . Shortness of breath on exertion   . Bruit of left carotid artery   . Acute upper GI bleeding 01/24/2012  . MGUS (monoclonal gammopathy of unknown significance) 04/21/2013  . Osteoporosis 11/29/2013  .  Recurrent right pleural effusion 07/26/2014  . Stage III chronic kidney disease 07/26/2014  . Goiter 07/27/2014  . Substernal goiter 12/23/2012  . Pleural effusion, right 12/23/2012  . Type 2 diabetes mellitus with renal manifestations ([facility] 07/26/2014  . Dysphonia 09/20/2014  . Vocal cord paralysis     Suspected due to massive substernal goiter  . CHF (congestive heart failure) ([facility]     Past Surgical History  Procedure Laterality Date  . Carpal tunnel release Left   . Breast biopsy    . Esophagogastroduodenoscopy  01/24/2012    Procedure: ESOPHAGOGASTRODUODENOSCOPY (EGD);  Surgeon: MJeryl Columbia MD;  [facility];  Service: Endoscopy;  Laterality: N/A;  . Thyroid surgery      "removed goiter"  . Cystectomy      from back of neck  . Knee surgery      right with rods  . Esophagogastroduodenoscopy    . Colonoscopy    . Total mastectomy Bilateral 05/10/2012    Procedure: RIGHT modified mastectomy; LEFT total mastectomy;  Surgeon: CHaywood Lasso MD;  [facility]  Service: General;  Laterality: Bilateral;  . Axillary sentinel node biopsy Right 05/10/2012    Procedure: AXILLARY SENTINEL lymph  NODE BIOPSY;  Surgeon: CHaywood Lasso MD;  [facility]  Service: General;  Laterality: Right;  nuclear medicine injection right side  7:00 am   . Abdominal hysterectomy      fibroids, with bilateral SO  . Chest tube insertion Right 09/27/2014    Procedure: INSERTION OF RIGHT PLEURAL DRAINAGE CATHETER;  Surgeon: EGrace Isaac MD;  [facility]  Service: Thoracic;  Laterality: Right;    Social History Social History  Substance Use Topics  . Smoking status: Former Smoker -- 0.00 packs/day    Types: Cigarettes    Quit date: 07/26/1971  . Smokeless tobacco: Never Used  . Alcohol Use: No    Family History Family History  Problem Relation Age of Onset  . Brain cancer Mother   . Aneurysm Father   . Diabetes Sister   . Diabetes Brother   . Diabetes Sister   .  Diabetes Brother   . Heart failure Sister     No Known Allergies   REVIEW OF SYSTEMS  General: [ ] Weight loss, [ ] Fever, [ ] chills Neurologic: [ ] Dizziness, [ ]  Blackouts, [ ] Seizure [ ] Stroke, [ ] "Mini stroke", [ ] Slurred speech, [ ] Temporary blindness; [ ] weakness in arms or legs, [ ] Hoarseness [ ] Dysphagia Cardiac: [ ] Chest pain/pressure, [ ] Shortness of breath at rest [ ] Shortness of breath with exertion, [ ] Atrial fibrillation or irregular heartbeat  Vascular: [ ] Pain in legs with walking, [ ] Pain in legs at rest, [ ] Pain in legs at night,  [ ] Non-healing ulcer, [ ] Blood clot in vein/DVT,   Pulmonary: [ ] Home oxygen, [ ] Productive cough, [ ] Coughing up blood, [ ] Asthma,  [ ] Wheezing [ ] COPD Musculoskeletal:  [ ] Arthritis, [ ] Low back pain, [ ] Joint pain Hematologic: [ ] Easy Bruising, [ ] Anemia; [ ] Hepatitis Gastrointestinal: [ ] Blood in stool, [ ] Gastroesophageal Reflux/heartburn, Urinary: [ ] chronic Kidney disease, [ ] on HD - [ ] MWF or [ ] TTHS, [ ] Burning with urination, [ ] Difficulty urinating Skin: [ ] Rashes, [ ] Wounds Psychological: [ ] Anxiety, [ ] Depression  Physical Examination Filed Vitals:   01/04/15 0000 01/04/15 0400 01/04/15 0545 01/04/15 0746  BP: 99/43 111/47  95/40  Pulse: 69 76  73  Temp: 98.3 F (36.8 C) 98.5 F (36.9 C)  99 F (37.2 C)  TempSrc: Oral Oral  Oral  Resp: _0 Height:      Weight:   129 lb 13.6 oz (58.9 kg)   SpO2: 100% 99%  100%   Body mass index is 21.61 kg/(m^2).  General:  WDWN in NAD HENT: WNL Eyes: Pupils equal Pulmonary: normal non-labored breathing , without Rales, rhonchi,  wheezing Cardiac: RRR, without  Murmurs, rubs or gallops; No carotid bruits Abdomen: soft, NT, no masses Skin: no rashes, ulcers noted;  no Gangrene , no cellulitis; right-sided pleural catheter remains in place Vascular Exam/Pulses:[place] radial and brachial pulses bilaterally Musculoskeletal: no muscle  wasting or atrophy; no edema  Neurologic: A&O X 3; Appropriate Affect ; SENSATION: normal; MOTOR FUNCTION: 5/5 Symmetric; Speech is fluent/normal Psychiatric: Judgment intact, Mood & affect appropriate for pt's clinical situation Lymph : No Cervical, Axillary, or Inguinal lymphadenopathy    Laboratory: CBC:    Component Value Date/Time   WBC 5.9 01/04/2015 0215   WBC 4.5 09/18/2014 0946   RBC 2.90* 01/04/2015 0215   RBC 3.34* 09/18/2014 0946   RBC 2.80* 01/24/2012 1011   HGB 7.9* 01/04/2015 0215   HGB 9.6* 09/18/2014 0946   HCT 24.9* 01/04/2015 0215   HCT 29.4* 09/18/2014 0946   PLT 319 01/04/2015 0215   PLT 319 09/18/2014 0946   MCV 85.9 01/04/2015 0215   MCV 87.9 09/18/2014 0946   MCH 27.2 01/04/2015 0215   MCH 28.7 09/18/2014 0946   MCHC 31.7 01/04/2015 0215   MCHC 32.7 09/18/2014 0946   RDW 16.7* 01/04/2015 0215   RDW 16.8* 09/18/2014 0946   LYMPHSABS 0.8 01/04/2015 0215   LYMPHSABS 0.6* 09/18/2014 0946   MONOABS 0.7 01/04/2015 0215   MONOABS 0.6 09/18/2014 0946   EOSABS 0.3 01/04/2015 0215   EOSABS 0.3 09/18/2014 0946   BASOSABS 0.0 01/04/2015 0215   BASOSABS 0.1 09/18/2014 0946    BMP:    Component Value Date/Time   NA 135 01/04/2015 0215   NA 143 09/18/2014 0946   K 4.4 01/04/2015 0215   K 4.0 09/18/2014 0946   CL 104 01/04/2015 0215  CL 107 08/05/2012 1009   CO2 23 01/04/2015 0215   CO2 30* 09/18/2014 0946   GLUCOSE 106* 01/04/2015 0215   GLUCOSE 94 09/18/2014 0946   GLUCOSE 128* 08/05/2012 1009   BUN 156* 01/04/2015 0215   BUN 31.2* 09/18/2014 0946   CREATININE 6.01* 01/04/2015 0215   CREATININE 1.7* 09/18/2014 0946   CALCIUM 8.8* 01/04/2015 0215   CALCIUM 11.8* 09/18/2014 0946   CALCIUM 10.7* 07/27/2014 0830   GFRNONAA 6* 01/04/2015 0215   GFRAA 7* 01/04/2015 0215    Coagulation: Lab Results  Component Value Date   INR 1.91* 01/04/2015   INR 5.15* 01/03/2015   INR 5.73* 01/02/2015   No results found for: PTT  Lipids: No results  found for: CHOL, TRIG, HDL, CHOLHDL, VLDL, LDLCALC, LDLDIRECT   Radiology: Dg Chest Left Decubitus  01/03/2015  CLINICAL DATA:  Pleural effusion. EXAM: CHEST - LEFT DECUBITUS COMPARISON:  January 01, 2015. FINDINGS: Small free flowing left pleural effusion is noted. Stable large amount of right thyroid lobe. Pleural drainage catheter is noted on the right. IMPRESSION: Small free flowing left pleural effusion is noted. Electronically Signed   By: Marijo Conception, M.D.   On: 01/03/2015 15:28   Ct Biopsy  01/04/2015  CLINICAL DATA:  81 year old female with a history of monoclonal gammopathy. Presents for bone marrow biopsy EXAM: CT-GUIDED BIOPSY bone marrow MEDICATIONS AND MEDICAL HISTORY: Versed 0.5 mg, Fentanyl 25 mcg. Additional Medications: None. ANESTHESIA/SEDATION: Moderate sedation time: 15 minutes PROCEDURE: The procedure risks, benefits, and alternatives were explained to the patient. Questions regarding the procedure were encouraged and answered. The patient understands and consents to the procedure. Scout CT of the pelvis was performed for surgical planning purposes. The posterior pelvis was prepped with Betadinein a sterile fashion, and a sterile drape was applied covering the operative field. A sterile gown and sterile gloves were used for the procedure. Local anesthesia was provided with 1% Lidocaine. We targeted the right posterior iliac bone for biopsy. The skin and subcutaneous tissues were infiltrated with 1% lidocaine without epinephrine. A small stab incision was made with an 11 blade scalpel, and an 11 gauge Murphy needle was advanced with CT guidance to the posterior cortex. Manual forced was used to advance the needle through the posterior cortex and the stylet was removed. A bone marrow aspirate was retrieved and passed to a cytotechnologist in the room. The Murphy needle was then advanced without the stylet for a core biopsy. The core biopsy was retrieved and also passed to a  cytotechnologist. Manual pressure was used for hemostasis and a sterile dressing was placed. No complications were encountered no significant blood loss was encountered. Patient tolerated the procedure well and remained hemodynamically stable throughout. FINDINGS: Scout image demonstrates safe approach to posterior iliac bone. Images during the case demonstrate placement of 11 gauge Murphy needle COMPLICATIONS: None IMPRESSION: Status post CT-guided bone marrow biopsy, with tissue specimen sent to pathology for complete histopathologic analysis Signed, Dulcy Fanny. Earleen Newport, DO [facility] Electronically Signed   By: Corrie Mckusick D.O.   On: 01/04/2015 10:35   Dg Chest Port 1 View  01/04/2015  CLINICAL DATA:  Right pneumothorax. EXAM: PORTABLE CHEST 1 VIEW COMPARISON:  Left lateral decubitus chest radiograph and bone survey 01/03/2015. Chest radiographs 01/01/2015. FINDINGS: Right superior mediastinal mass is again noted consistent with markedly enlarged thyroid. The cardiac silhouette remains mildly enlarged. Mild central pulmonary vascular congestion remains. A right-sided pleural catheter remains in place. There are  persistent small bilateral pleural effusions, unchanged from 01/01/2015. Patchy bibasilar opacities also do not appear significantly changed. No pneumothorax is identified. IMPRESSION: 1. Unchanged small bilateral pleural effusions and bibasilar atelectasis. 2. No pneumothorax identified. Electronically Signed   By: Logan Bores M.D.   On: 01/04/2015 07:37   Dg Bone Survey Met  01/03/2015  ADDENDUM REPORT: 01/03/2015 13:51 ADDENDUM: Critical Value/emergent results were discussed with the patient's provider DAWOOD ELGERGAWY on 01/03/2015 at time 1:50 pm. Electronically Signed   By: Nolon Nations M.D.   On: 01/03/2015 13:51  01/03/2015  CLINICAL DATA:  History of MGUS with worsening renal function. Shortness of breath. Pain all over body.  History of breast cancer. EXAM: METASTATIC BONE SURVEY COMPARISON:  07/27/2014 bone scan, PET-CT 09/18/2014, and metastatic survey 12/06/2012 FINDINGS: No lytic or blastic lesions are identified to suggest metastases or multiple myeloma deposits. Patient is a edentulous. Degenerative changes are seen in the cervical, thoracic, and lumbar spine. Significant disc height loss and sclerosis identified at L3-4. Previous ORIF of right distal tibia and fibular fractures. Small probable bone island identified within the distal third of the left radius. The left radius was not imaged previously. Superior mediastinal mass consistent with enlarged thyroid as seen on prior studies. There has been development of lucency at the right lung base. Patient has a right pleural catheter. There is small right pleural effusion. Suspect interval development of right basilar pneumothorax. Small left pleural effusion is present. There is left lower lobe infiltrate, stable in appearance. Status post bilateral mastectomy and bilateral axillary node dissection. IMPRESSION: 1. Interval development of right basilar pneumothorax. 2. Persistent small right pleural effusion. 3. Persistent left lower lobe infiltrate and pleural effusion. 4. Large right mediastinal mass stable and consistent with enlarged thyroid. 5. No suspicious lytic or blastic lesions. Critical Value/emergent results will be telephoned to the patient's provider DAWOOD [facility] at the time of interpretation on date 01/03/2015 at time 1:44 pm. Electronically Signed: By: Nolon Nations M.D. On: 01/03/2015 13:44       Non-Invasive Vascular Imaging: BUE Vein mapping (01/03/15): no adequate vein for access   ASSESSMENT/PLAN:  1. Acute on CKD stage IV 2. Acute on chronic diastolic CHF 3. Recurrent R pleural effusion 4. pAF 5. MGUS 6. Hypotension 7. Anticoagulation    CHF right-sided pleural catheter remains in place  Supra therapeutic INR at admission now  coumadin is on hold current INR 1.91  Plan for diatek placement once INR is less than 1.5 hopefully tomorrow.  She is interested in discussing peritoneal dialysis options too.  Continue to hold coumadin, vein mapping ordered she is right hand dominant.    Laurence Slate [facility] 01/04/2015 9:20 AM  Addendum  I have independently interviewed and examined the patient, and I agree with the physician assistant's findings.  Pt and husband agree that she needs to do dialysis but aren't certain whether she would proceed with HD vs PD long-term wise.  Additional, education in regards to permanent access options recommended.  Will reorder BUE vein mapping for brachial vein evaluation, otherwise, her only option is an AVG.  Given marginal BP currently, SBP 80s, I doubt she is a good permanent access candidate if AVG if her only option.  Keep NPO.  If INR < 1.5 tomorrow, will proceed with [facility] placement.  Adele Barthel, MD [facility] [place] Office: [phone] Pager: [phone]  01/04/2015, 1:01 PM

## 2015-01-04 NOTE — Progress Notes (Signed)
S: Some nausea O:BP 95/40 mmHg  Pulse 73  Temp(Src) 99 F (37.2 C) (Oral)  Resp 17  Ht 5' 5"  (1.651 m)  Wt 58.9 kg (129 lb 13.6 oz)  BMI 21.61 kg/m2  SpO2 100%  Intake/Output Summary (Last 24 hours) at 01/04/15 0823 Last data filed at 01/04/15 0753  Gross per 24 hour  Intake    185 ml  Output   2300 ml  Net  -2115 ml   Weight change:  LZJ:QBHAL and alert CVS:RRR 2/6 systolic M Resp:Decreased BS bases.  Rt pleurex tube Abd:+ BS NTND Ext: 3+ edema LE NEURO:CNI Ox3 no asterixis   . amiodarone  200 mg Oral Daily  . anastrozole  1 mg Oral Daily  . darbepoetin (ARANESP) injection - NON-DIALYSIS  100 mcg Subcutaneous Q Tue-1800  . feeding supplement (ENSURE ENLIVE)  237 mL Oral BID BM  . ferumoxytol  510 mg Intravenous Weekly  . furosemide  160 mg Intravenous 3 times per day  . insulin aspart  0-9 Units Subcutaneous TID WC  . methimazole  20 mg Oral Daily  . sodium chloride  3 mL Intravenous Q12H  . thiamine  100 mg Oral Daily  . Warfarin - Pharmacist Dosing Inpatient   Does not apply q1800   Dg Chest Left Decubitus  01/03/2015  CLINICAL DATA:  Pleural effusion. EXAM: CHEST - LEFT DECUBITUS COMPARISON:  January 01, 2015. FINDINGS: Small free flowing left pleural effusion is noted. Stable large amount of right thyroid lobe. Pleural drainage catheter is noted on the right. IMPRESSION: Small free flowing left pleural effusion is noted. Electronically Signed   By: Marijo Conception, M.D.   On: 01/03/2015 15:28   Dg Chest Port 1 View  01/04/2015  CLINICAL DATA:  Right pneumothorax. EXAM: PORTABLE CHEST 1 VIEW COMPARISON:  Left lateral decubitus chest radiograph and bone survey 01/03/2015. Chest radiographs 01/01/2015. FINDINGS: Right superior mediastinal mass is again noted consistent with markedly enlarged thyroid. The cardiac silhouette remains mildly enlarged. Mild central pulmonary vascular congestion remains. A right-sided pleural catheter remains in place. There are persistent  small bilateral pleural effusions, unchanged from 01/01/2015. Patchy bibasilar opacities also do not appear significantly changed. No pneumothorax is identified. IMPRESSION: 1. Unchanged small bilateral pleural effusions and bibasilar atelectasis. 2. No pneumothorax identified. Electronically Signed   By: Logan Bores M.D.   On: 01/04/2015 07:37   Dg Bone Survey Met  01/03/2015  ADDENDUM REPORT: 01/03/2015 13:51 ADDENDUM: Critical Value/emergent results were discussed with the patient's provider DAWOOD ELGERGAWY on 01/03/2015 at time 1:50 pm. Electronically Signed   By: Nolon Nations M.D.   On: 01/03/2015 13:51  01/03/2015  CLINICAL DATA:  History of MGUS with worsening renal function. Shortness of breath. Pain all over body. History of breast cancer. EXAM: METASTATIC BONE SURVEY COMPARISON:  07/27/2014 bone scan, PET-CT 09/18/2014, and metastatic survey 12/06/2012 FINDINGS: No lytic or blastic lesions are identified to suggest metastases or multiple myeloma deposits. Patient is a edentulous. Degenerative changes are seen in the cervical, thoracic, and lumbar spine. Significant disc height loss and sclerosis identified at L3-4. Previous ORIF of right distal tibia and fibular fractures. Small probable bone island identified within the distal third of the left radius. The left radius was not imaged previously. Superior mediastinal mass consistent with enlarged thyroid as seen on prior studies. There has been development of lucency at the right lung base. Patient has a right pleural catheter. There is small right pleural effusion. Suspect interval development of right basilar  pneumothorax. Small left pleural effusion is present. There is left lower lobe infiltrate, stable in appearance. Status post bilateral mastectomy and bilateral axillary node dissection. IMPRESSION: 1. Interval development of right basilar pneumothorax. 2. Persistent small right pleural effusion. 3. Persistent left lower lobe infiltrate  and pleural effusion. 4. Large right mediastinal mass stable and consistent with enlarged thyroid. 5. No suspicious lytic or blastic lesions. Critical Value/emergent results will be telephoned to the patient's provider DAWOOD [facility] at the time of interpretation on date 01/03/2015 at time 1:44 pm. Electronically Signed: By: Nolon Nations M.D. On: 01/03/2015 13:44   BMET    Component Value Date/Time   NA 135 01/04/2015 0215   NA 143 09/18/2014 0946   K 4.4 01/04/2015 0215   K 4.0 09/18/2014 0946   CL 104 01/04/2015 0215   CL 107 08/05/2012 1009   CO2 23 01/04/2015 0215   CO2 30* 09/18/2014 0946   GLUCOSE 106* 01/04/2015 0215   GLUCOSE 94 09/18/2014 0946   GLUCOSE 128* 08/05/2012 1009   BUN 156* 01/04/2015 0215   BUN 31.2* 09/18/2014 0946   CREATININE 6.01* 01/04/2015 0215   CREATININE 1.7* 09/18/2014 0946   CALCIUM 8.8* 01/04/2015 0215   CALCIUM 11.8* 09/18/2014 0946   CALCIUM 10.7* 07/27/2014 0830   GFRNONAA 6* 01/04/2015 0215   GFRAA 7* 01/04/2015 0215   CBC    Component Value Date/Time   WBC 5.9 01/04/2015 0215   WBC 4.5 09/18/2014 0946   RBC 2.90* 01/04/2015 0215   RBC 3.34* 09/18/2014 0946   RBC 2.80* 01/24/2012 1011   HGB 7.9* 01/04/2015 0215   HGB 9.6* 09/18/2014 0946   HCT 24.9* 01/04/2015 0215   HCT 29.4* 09/18/2014 0946   PLT 319 01/04/2015 0215   PLT 319 09/18/2014 0946   MCV 85.9 01/04/2015 0215   MCV 87.9 09/18/2014 0946   MCH 27.2 01/04/2015 0215   MCH 28.7 09/18/2014 0946   MCHC 31.7 01/04/2015 0215   MCHC 32.7 09/18/2014 0946   RDW 16.7* 01/04/2015 0215   RDW 16.8* 09/18/2014 0946   LYMPHSABS 0.8 01/04/2015 0215   LYMPHSABS 0.6* 09/18/2014 0946   MONOABS 0.7 01/04/2015 0215   MONOABS 0.6 09/18/2014 0946   EOSABS 0.3 01/04/2015 0215   EOSABS 0.3 09/18/2014 0946   BASOSABS 0.0 01/04/2015 0215   BASOSABS 0.1 09/18/2014 0946     Assessment:  1. Acute on CKD 4 that I suspect is related to cardiorenal syndrome due to diastolic dysfunction  +/- Rt heart failure.  I would expect to see proteinuria if amyloid was playing a role though myeloma kidney could present this way.  UO has improved but has some uremic Sxs 2. Hyperkalemia, improved 3. Hx M-spike thought to be MGUS 4. Anemia on aranesp and given IV iron 5. Sec HPTH though PTH only 169 so no Vit D for now  Plan: 1.  For BM bx today 2.  Will ask [facility] to see for permcath placement tomorrow then will need AVF/AVG next week.  I had a long discussion with her about whether dialysis is what she wants as QOL will be lower, she says she will accept that. 3. Recheck labs in AM  Shynice Sigel T

## 2015-01-04 NOTE — Progress Notes (Signed)
Vein mapping of brachial veins has been completed.  Landry Mellow, RDMS, RVT 01/04/2015

## 2015-01-04 NOTE — Progress Notes (Signed)
PT Cancellation Note  Patient Details Name: Alison Gross MRN: [id number] DOB: 11-Jun-1932   Cancelled Treatment:    Reason Eval/Treat Not Completed: Patient declined, no reason specified, patient does not with to participate at this time.   Duncan Dull 01/04/2015, 5:17 PM Alben Deeds, [place] DPT  [phone]

## 2015-01-05 ENCOUNTER — Encounter (HOSPITAL_COMMUNITY): Payer: Self-pay | Admitting: Anesthesiology

## 2015-01-05 ENCOUNTER — Encounter (HOSPITAL_COMMUNITY)
Admission: EM | Disposition: A | Discharge status: Home Health | Payer: Self-pay | Source: Home / Self Care | Attending: Internal Medicine

## 2015-01-05 ENCOUNTER — Inpatient Hospital Stay (HOSPITAL_COMMUNITY): Payer: Commercial Managed Care - HMO

## 2015-01-05 ENCOUNTER — Inpatient Hospital Stay (HOSPITAL_COMMUNITY): Payer: Commercial Managed Care - HMO | Admitting: Anesthesiology

## 2015-01-05 HISTORY — PX: INSERTION OF DIALYSIS CATHETER: SHX1324

## 2015-01-05 LAB — CBC
HEMATOCRIT: 25.3 % — AB (ref 36.0–46.0)
HEMOGLOBIN: 8.2 g/dL — AB (ref 12.0–15.0)
MCH: 28 pg (ref 26.0–34.0)
MCHC: 32.4 g/dL (ref 30.0–36.0)
MCV: 86.3 fL (ref 78.0–100.0)
Platelets: 317 10*3/uL (ref 150–400)
RBC: 2.93 MIL/uL — ABNORMAL LOW (ref 3.87–5.11)
RDW: 16.8 % — ABNORMAL HIGH (ref 11.5–15.5)
WBC: 6.9 10*3/uL (ref 4.0–10.5)

## 2015-01-05 LAB — RENAL FUNCTION PANEL
ANION GAP: 10 (ref 5–15)
Albumin: 2.3 g/dL — ABNORMAL LOW (ref 3.5–5.0)
BUN: 172 mg/dL — ABNORMAL HIGH (ref 6–20)
CHLORIDE: 102 mmol/L (ref 101–111)
CO2: 24 mmol/L (ref 22–32)
Calcium: 9 mg/dL (ref 8.9–10.3)
Creatinine, Ser: 6.19 mg/dL — ABNORMAL HIGH (ref 0.44–1.00)
GFR calc non Af Amer: 6 mL/min — ABNORMAL LOW (ref >60)
GFR, EST AFRICAN AMERICAN: 7 mL/min — AB (ref >60)
Glucose, Bld: 113 mg/dL — ABNORMAL HIGH (ref 65–99)
Phosphorus: 7.4 mg/dL — ABNORMAL HIGH (ref 2.5–4.6)
Potassium: 4.6 mmol/L (ref 3.5–5.1)
Sodium: 136 mmol/L (ref 135–145)

## 2015-01-05 LAB — GLUCOSE, CAPILLARY
GLUCOSE-CAPILLARY: 102 mg/dL — AB (ref 65–99)
GLUCOSE-CAPILLARY: 96 mg/dL (ref 65–99)
GLUCOSE-CAPILLARY: 70 mg/dL (ref 65–99)
Glucose-Capillary: 102 mg/dL — ABNORMAL HIGH (ref 65–99)

## 2015-01-05 LAB — SURGICAL PCR SCREEN
MRSA, PCR: NEGATIVE
Staphylococcus aureus: NEGATIVE

## 2015-01-05 LAB — PROTIME-INR
INR: 1.31 (ref 0.00–1.49)
Prothrombin Time: 16.4 seconds — ABNORMAL HIGH (ref 11.6–15.2)

## 2015-01-05 SURGERY — INSERTION OF DIALYSIS CATHETER
Anesthesia: Monitor Anesthesia Care

## 2015-01-05 MED ORDER — SODIUM CHLORIDE 0.9 % IV SOLN
INTRAVENOUS | Status: DC | PRN
  Administered 2015-01-05: 07:00:00 via INTRAVENOUS

## 2015-01-05 MED ORDER — OXYCODONE-ACETAMINOPHEN 5-325 MG PO TABS
1.0000 | ORAL_TABLET | ORAL | Status: DC | PRN
Start: 2015-01-05 — End: 2015-01-10
  Administered 2015-01-05: 1 via ORAL
  Administered 2015-01-06 (×2): 2 via ORAL
  Filled 2015-01-05 (×2): qty 2
  Filled 2015-01-05: qty 1

## 2015-01-05 MED ORDER — MEPERIDINE HCL 25 MG/ML IJ SOLN: 6.2500 mg | INTRAMUSCULAR | Status: DC | PRN

## 2015-01-05 MED ORDER — MIDODRINE HCL 5 MG PO TABS
ORAL_TABLET | ORAL | Status: AC
  Filled 2015-01-05: qty 2

## 2015-01-05 MED ORDER — FENTANYL CITRATE (PF) 250 MCG/5ML IJ SOLN
INTRAMUSCULAR | Status: AC
  Filled 2015-01-05: qty 5

## 2015-01-05 MED ORDER — LIDOCAINE HCL 1 % IJ SOLN
INTRAMUSCULAR | Status: DC | PRN
  Administered 2015-01-05: 10 mL

## 2015-01-05 MED ORDER — HEPARIN (PORCINE) IN NACL 100-0.45 UNIT/ML-% IJ SOLN
1750.0000 [IU]/h | INTRAMUSCULAR | Status: DC
  Administered 2015-01-05: 850 [IU]/h via INTRAVENOUS
  Administered 2015-01-06: 1200 [IU]/h via INTRAVENOUS
  Administered 2015-01-07: 1650 [IU]/h via INTRAVENOUS
  Filled 2015-01-05 (×4): qty 250

## 2015-01-05 MED ORDER — FUROSEMIDE 80 MG PO TABS
160.0000 mg | ORAL_TABLET | Freq: Three times a day (TID) | ORAL | Status: DC
  Administered 2015-01-05 – 2015-01-06 (×5): 160 mg via ORAL
  Filled 2015-01-05 (×2): qty 2
  Filled 2015-01-05: qty 4
  Filled 2015-01-05 (×2): qty 2

## 2015-01-05 MED ORDER — 0.9 % SODIUM CHLORIDE (POUR BTL) OPTIME
TOPICAL | Status: DC | PRN
  Administered 2015-01-05: 1000 mL

## 2015-01-05 MED ORDER — HEPARIN SODIUM (PORCINE) 1000 UNIT/ML IJ SOLN
INTRAMUSCULAR | Status: DC | PRN
Start: 2015-01-05 — End: 2015-01-05
  Administered 2015-01-05: 4.2 mL via INTRAVENOUS

## 2015-01-05 MED ORDER — RENA-VITE PO TABS
1.0000 | ORAL_TABLET | Freq: Every day | ORAL | Status: DC
  Administered 2015-01-05 – 2015-01-09 (×5): 1 via ORAL
  Filled 2015-01-05 (×5): qty 1

## 2015-01-05 MED ORDER — PROMETHAZINE HCL 25 MG/ML IJ SOLN: 6.2500 mg | INTRAMUSCULAR | Status: DC | PRN

## 2015-01-05 MED ORDER — LIDOCAINE HCL (PF) 1 % IJ SOLN
INTRAMUSCULAR | Status: AC
  Filled 2015-01-05: qty 30

## 2015-01-05 MED ORDER — LIDOCAINE-EPINEPHRINE (PF) 1 %-1:200000 IJ SOLN
INTRAMUSCULAR | Status: AC
  Filled 2015-01-05: qty 30

## 2015-01-05 MED ORDER — PROPOFOL 10 MG/ML IV BOLUS
INTRAVENOUS | Status: AC
  Filled 2015-01-05: qty 20

## 2015-01-05 MED ORDER — HEPARIN SODIUM (PORCINE) 1000 UNIT/ML IJ SOLN
INTRAMUSCULAR | Status: AC
  Filled 2015-01-05: qty 1

## 2015-01-05 MED ORDER — LIDOCAINE-EPINEPHRINE (PF) 1 %-1:200000 IJ SOLN: INTRAMUSCULAR | Status: DC | PRN

## 2015-01-05 MED ORDER — HYDROMORPHONE HCL 1 MG/ML IJ SOLN: 0.2500 mg | INTRAMUSCULAR | Status: DC | PRN

## 2015-01-05 MED ORDER — SODIUM CHLORIDE 0.9 % IV SOLN
INTRAVENOUS | Status: DC | PRN
  Administered 2015-01-05: 500 mL

## 2015-01-05 MED ORDER — MIDODRINE HCL 5 MG PO TABS
10.0000 mg | ORAL_TABLET | Freq: Two times a day (BID) | ORAL | Status: DC
  Administered 2015-01-05 – 2015-01-06 (×2): 10 mg via ORAL
  Filled 2015-01-05: qty 2

## 2015-01-05 SURGICAL SUPPLY — 41 items
BAG DECANTER FOR FLEXI CONT (MISCELLANEOUS) ×3 IMPLANT
BIOPATCH RED 1 DISK 7.0 (GAUZE/BANDAGES/DRESSINGS) ×2 IMPLANT
BIOPATCH RED 1IN DISK 7.0MM (GAUZE/BANDAGES/DRESSINGS) ×1
CATH CANNON HEMO 15F 50CM (CATHETERS) IMPLANT
CATH CANNON HEMO 15FR 19 (HEMODIALYSIS SUPPLIES) ×2 IMPLANT
CATH CANNON HEMO 15FR 23CM (HEMODIALYSIS SUPPLIES) IMPLANT
CATH CANNON HEMO 15FR 31CM (HEMODIALYSIS SUPPLIES) IMPLANT
CATH CANNON HEMO 15FR 32 (HEMODIALYSIS SUPPLIES) IMPLANT
CATH CANNON HEMO 15FR 32CM (HEMODIALYSIS SUPPLIES) IMPLANT
CHLORAPREP W/TINT 26ML (MISCELLANEOUS) ×3 IMPLANT
COVER DOME SNAP 22 D (MISCELLANEOUS) ×2 IMPLANT
COVER PROBE W GEL 5X96 (DRAPES) ×2 IMPLANT
DRAPE C-ARM 42X72 X-RAY (DRAPES) ×1 IMPLANT
DRAPE CHEST BREAST 15X10 FENES (DRAPES) ×3 IMPLANT
GAUZE SPONGE 2X2 8PLY STRL LF (GAUZE/BANDAGES/DRESSINGS) ×1 IMPLANT
GAUZE SPONGE 4X4 16PLY XRAY LF (GAUZE/BANDAGES/DRESSINGS) ×3 IMPLANT
GLOVE BIO SURGEON STRL SZ7.5 (GLOVE) ×3 IMPLANT
GLOVE BIOGEL PI IND STRL 8 (GLOVE) ×1 IMPLANT
GLOVE BIOGEL PI INDICATOR 8 (GLOVE) ×2
GOWN STRL REUS W/ TWL LRG LVL3 (GOWN DISPOSABLE) ×2 IMPLANT
GOWN STRL REUS W/TWL LRG LVL3 (GOWN DISPOSABLE) ×6
KIT BASIN OR (CUSTOM PROCEDURE TRAY) ×3 IMPLANT
KIT ROOM TURNOVER OR (KITS) ×3 IMPLANT
LIQUID BAND (GAUZE/BANDAGES/DRESSINGS) ×2 IMPLANT
NDL 18GX1X1/2 (RX/OR ONLY) (NEEDLE) ×1 IMPLANT
NDL HYPO 25GX1X1/2 BEV (NEEDLE) ×1 IMPLANT
NEEDLE 18GX1X1/2 (RX/OR ONLY) (NEEDLE) ×3 IMPLANT
NEEDLE 22X1 1/2 (OR ONLY) (NEEDLE) ×3 IMPLANT
NEEDLE HYPO 25GX1X1/2 BEV (NEEDLE) ×3 IMPLANT
NS IRRIG 1000ML POUR BTL (IV SOLUTION) ×3 IMPLANT
PACK SURGICAL SETUP 50X90 (CUSTOM PROCEDURE TRAY) ×3 IMPLANT
PAD ARMBOARD 7.5X6 YLW CONV (MISCELLANEOUS) ×6 IMPLANT
SPONGE GAUZE 2X2 STER 10/PKG (GAUZE/BANDAGES/DRESSINGS) ×2
SUT ETHILON 3 0 PS 1 (SUTURE) ×3 IMPLANT
SUT VICRYL 4-0 PS2 18IN ABS (SUTURE) ×3 IMPLANT
SYR 20CC LL (SYRINGE) ×6 IMPLANT
SYR 5ML LL (SYRINGE) ×6 IMPLANT
SYR CONTROL 10ML LL (SYRINGE) ×3 IMPLANT
SYRINGE 10CC LL (SYRINGE) ×3 IMPLANT
TAPE CLOTH SURG 4X10 WHT LF (GAUZE/BANDAGES/DRESSINGS) ×2 IMPLANT
WATER STERILE IRR 1000ML POUR (IV SOLUTION) ×3 IMPLANT

## 2015-01-05 NOTE — Care Management Note (Signed)
Gross Management Note  Patient Details  Name: Alison Gross MRN: [id number] Date of Birth: [birth year]  Subjective/Objective:   Pt lives with spouse and dtr, is active with [facility] for nursing services.  PT eval is pending.                             Expected Discharge Plan:  [place]  Discharge planning Services  CM Consult  Post Acute Care Choice:  Resumption of Svcs/PTA Provider  HH Arranged:  RN [facility] Agency:  [place]  Status of Service:  In process, will continue to follow  Medicare Important Message Given:  [facility] notification given  Girard Cooter, RN 01/05/2015, 2:41 PM

## 2015-01-05 NOTE — Anesthesia Postprocedure Evaluation (Signed)
Anesthesia Post Note  Patient: Alison Gross  Procedure(s) Performed: Procedure(s) (LRB): INSERTION OF DIALYSIS CATHETER (N/A)  Anesthesia type: General  Patient location: PACU  Post pain: Pain level controlled  Post assessment: Post-op Vital signs reviewed  Last Vitals: BP 85/41 mmHg  Pulse 66  Temp(Src) 37.1 C (Oral)  Resp 15  Ht '5\' 5"'$  (1.651 m)  Wt 126 lb 5.2 oz (57.3 kg)  BMI 21.02 kg/m2  SpO2 96%  Post vital signs: Reviewed  Level of consciousness: sedated  Complications: No apparent anesthesia complications

## 2015-01-05 NOTE — Anesthesia Procedure Notes (Signed)
Procedure Name: MAC Date/Time: 01/05/2015 8:05 AM Performed by: Jacquiline Doe A Pre-anesthesia Checklist: Patient identified, Emergency Drugs available, Suction available, Patient being monitored and Timeout performed Patient Re-evaluated:Patient Re-evaluated prior to inductionOxygen Delivery Method: Nasal cannula Placement Confirmation: positive ETCO2 Dental Injury: Teeth and Oropharynx as per pre-operative assessment

## 2015-01-05 NOTE — Progress Notes (Signed)
S: Some nausea.  SP permcath placement O:BP 85/41 mmHg  Pulse 66  Temp(Src) 98.7 F (37.1 C) (Oral)  Resp 15  Ht 5' 5"  (1.651 m)  Wt 57.3 kg (126 lb 5.2 oz)  BMI 21.02 kg/m2  SpO2 96%  Intake/Output Summary (Last 24 hours) at 01/05/15 1141 Last data filed at 01/05/15 1000  Gross per 24 hour  Intake   1070 ml  Output    700 ml  Net    370 ml   Weight change: -2.983 kg (-6 lb 9.2 oz) XNT:ZGYFV and alert CVS:RRR 2/6 systolic M Resp:Decreased BS bases.  Rt pleurex tube Abd:+ BS NTND Ext: 2-3+ edema LE NEURO:CNI Ox3 no asterixis Rt IJ permcath   . amiodarone  200 mg Oral Daily  . anastrozole  1 mg Oral Daily  . darbepoetin (ARANESP) injection - NON-DIALYSIS  100 mcg Subcutaneous Q Tue-1800  . feeding supplement (ENSURE ENLIVE)  237 mL Oral BID BM  . ferumoxytol  510 mg Intravenous Weekly  . furosemide  160 mg Intravenous 3 times per day  . insulin aspart  0-9 Units Subcutaneous TID WC  . methimazole  20 mg Oral Daily  . multivitamin  1 tablet Oral QHS  . sodium chloride  3 mL Intravenous Q12H  . thiamine  100 mg Oral Daily   Dg Chest Left Decubitus  01/03/2015  CLINICAL DATA:  Pleural effusion. EXAM: CHEST - LEFT DECUBITUS COMPARISON:  January 01, 2015. FINDINGS: Small free flowing left pleural effusion is noted. Stable large amount of right thyroid lobe. Pleural drainage catheter is noted on the right. IMPRESSION: Small free flowing left pleural effusion is noted. Electronically Signed   By: Marijo Conception, M.D.   On: 01/03/2015 15:28   Ct Biopsy  01/04/2015  CLINICAL DATA:  81 year old female with a history of monoclonal gammopathy. Presents for bone marrow biopsy EXAM: CT-GUIDED BIOPSY bone marrow MEDICATIONS AND MEDICAL HISTORY: Versed 0.5 mg, Fentanyl 25 mcg. Additional Medications: None. ANESTHESIA/SEDATION: Moderate sedation time: 15 minutes PROCEDURE: The procedure risks, benefits, and alternatives were explained to the patient. Questions regarding the procedure were  encouraged and answered. The patient understands and consents to the procedure. Scout CT of the pelvis was performed for surgical planning purposes. The posterior pelvis was prepped with Betadinein a sterile fashion, and a sterile drape was applied covering the operative field. A sterile gown and sterile gloves were used for the procedure. Local anesthesia was provided with 1% Lidocaine. We targeted the right posterior iliac bone for biopsy. The skin and subcutaneous tissues were infiltrated with 1% lidocaine without epinephrine. A small stab incision was made with an 11 blade scalpel, and an 11 gauge Murphy needle was advanced with CT guidance to the posterior cortex. Manual forced was used to advance the needle through the posterior cortex and the stylet was removed. A bone marrow aspirate was retrieved and passed to a cytotechnologist in the room. The Murphy needle was then advanced without the stylet for a core biopsy. The core biopsy was retrieved and also passed to a cytotechnologist. Manual pressure was used for hemostasis and a sterile dressing was placed. No complications were encountered no significant blood loss was encountered. Patient tolerated the procedure well and remained hemodynamically stable throughout. FINDINGS: Scout image demonstrates safe approach to posterior iliac bone. Images during the case demonstrate placement of 11 gauge Murphy needle COMPLICATIONS: None IMPRESSION: Status post CT-guided bone marrow biopsy, with tissue specimen sent to pathology for complete histopathologic analysis Signed, Dulcy Fanny.  Earleen Newport, DO [facility] Electronically Signed   By: Corrie Mckusick D.O.   On: 01/04/2015 10:35   Dg Chest Port 1 View  01/05/2015  CLINICAL DATA:  Status post dialysis catheter insertion. EXAM: PORTABLE CHEST 1 VIEW COMPARISON:  January 04, 2015. FINDINGS: Stable cardiomediastinal silhouette. Large right superior mediastinal mass  is unchanged. Interval placement of right internal jugular dialysis catheter with distal tips in the expected position of the SVC. No pneumothorax is noted. Mild bilateral pleural effusions are noted. Bibasilar opacities are noted concerning for edema or atelectasis. Surgical clips are noted in the axillary regions bilaterally. Bony thorax is unremarkable. IMPRESSION: Interval placement of right internal jugular dialysis catheter with distal tips in the expected position of the SVC. No pneumothorax is noted. Stable large right superior mediastinal mass is noted. Mild bilateral pleural effusions are noted, with bibasilar opacities concerning for edema or atelectasis. Electronically Signed   By: Marijo Conception, M.D.   On: 01/05/2015 09:24   Dg Chest Port 1 View  01/04/2015  CLINICAL DATA:  Right pneumothorax. EXAM: PORTABLE CHEST 1 VIEW COMPARISON:  Left lateral decubitus chest radiograph and bone survey 01/03/2015. Chest radiographs 01/01/2015. FINDINGS: Right superior mediastinal mass is again noted consistent with markedly enlarged thyroid. The cardiac silhouette remains mildly enlarged. Mild central pulmonary vascular congestion remains. A right-sided pleural catheter remains in place. There are persistent small bilateral pleural effusions, unchanged from 01/01/2015. Patchy bibasilar opacities also do not appear significantly changed. No pneumothorax is identified. IMPRESSION: 1. Unchanged small bilateral pleural effusions and bibasilar atelectasis. 2. No pneumothorax identified. Electronically Signed   By: Logan Bores M.D.   On: 01/04/2015 07:37   Dg Bone Survey Met  01/03/2015  ADDENDUM REPORT: 01/03/2015 13:51 ADDENDUM: Critical Value/emergent results were discussed with the patient's provider DAWOOD ELGERGAWY on 01/03/2015 at time 1:50 pm. Electronically Signed   By: Nolon Nations M.D.   On: 01/03/2015 13:51  01/03/2015  CLINICAL DATA:  History of MGUS with worsening renal function. Shortness of  breath. Pain all over body. History of breast cancer. EXAM: METASTATIC BONE SURVEY COMPARISON:  07/27/2014 bone scan, PET-CT 09/18/2014, and metastatic survey 12/06/2012 FINDINGS: No lytic or blastic lesions are identified to suggest metastases or multiple myeloma deposits. Patient is a edentulous. Degenerative changes are seen in the cervical, thoracic, and lumbar spine. Significant disc height loss and sclerosis identified at L3-4. Previous ORIF of right distal tibia and fibular fractures. Small probable bone island identified within the distal third of the left radius. The left radius was not imaged previously. Superior mediastinal mass consistent with enlarged thyroid as seen on prior studies. There has been development of lucency at the right lung base. Patient has a right pleural catheter. There is small right pleural effusion. Suspect interval development of right basilar pneumothorax. Small left pleural effusion is present. There is left lower lobe infiltrate, stable in appearance. Status post bilateral mastectomy and bilateral axillary node dissection. IMPRESSION: 1. Interval development of right basilar pneumothorax. 2. Persistent small right pleural effusion. 3. Persistent left lower lobe infiltrate and pleural effusion. 4. Large right mediastinal mass stable and consistent with enlarged thyroid. 5. No suspicious lytic or blastic lesions. Critical Value/emergent results will be telephoned to the patient's provider DAWOOD [facility] at the time of interpretation on date 01/03/2015 at time 1:44 pm. Electronically Signed: By: Nolon Nations M.D. On: 01/03/2015 13:44   BMET    Component Value Date/Time   NA 136 01/05/2015 0545  NA 143 09/18/2014 0946   K 4.6 01/05/2015 0545   K 4.0 09/18/2014 0946   CL 102 01/05/2015 0545   CL 107 08/05/2012 1009   CO2 24 01/05/2015 0545   CO2 30* 09/18/2014 0946   GLUCOSE 113* 01/05/2015 0545   GLUCOSE 94 09/18/2014 0946   GLUCOSE 128* 08/05/2012 1009   BUN  172* 01/05/2015 0545   BUN 31.2* 09/18/2014 0946   CREATININE 6.19* 01/05/2015 0545   CREATININE 1.7* 09/18/2014 0946   CALCIUM 9.0 01/05/2015 0545   CALCIUM 11.8* 09/18/2014 0946   CALCIUM 10.7* 07/27/2014 0830   GFRNONAA 6* 01/05/2015 0545   GFRAA 7* 01/05/2015 0545   CBC    Component Value Date/Time   WBC 6.9 01/05/2015 0545   WBC 4.5 09/18/2014 0946   RBC 2.93* 01/05/2015 0545   RBC 3.34* 09/18/2014 0946   RBC 2.80* 01/24/2012 1011   HGB 8.2* 01/05/2015 0545   HGB 9.6* 09/18/2014 0946   HCT 25.3* 01/05/2015 0545   HCT 29.4* 09/18/2014 0946   PLT 317 01/05/2015 0545   PLT 319 09/18/2014 0946   MCV 86.3 01/05/2015 0545   MCV 87.9 09/18/2014 0946   MCH 28.0 01/05/2015 0545   MCH 28.7 09/18/2014 0946   MCHC 32.4 01/05/2015 0545   MCHC 32.7 09/18/2014 0946   RDW 16.8* 01/05/2015 0545   RDW 16.8* 09/18/2014 0946   LYMPHSABS 0.8 01/04/2015 0215   LYMPHSABS 0.6* 09/18/2014 0946   MONOABS 0.7 01/04/2015 0215   MONOABS 0.6 09/18/2014 0946   EOSABS 0.3 01/04/2015 0215   EOSABS 0.3 09/18/2014 0946   BASOSABS 0.0 01/04/2015 0215   BASOSABS 0.1 09/18/2014 0946     Assessment:  1. Acute on CKD 4 that I suspect is related to cardiorenal syndrome due to diastolic dysfunction .  ? If has myeloma kidney.  BM Bx pending 2. Hyperkalemia, improved 3. Hx M-spike thought to be MGUS 4. Anemia on aranesp and given IV iron 5. Sec HPTH though PTH only 169 so no Vit D for now  Plan: 1.  For HD today and will do again tomorrow 2. Dc IV lasix and change to PO for now 3.  She will be doing HD, not PD.  Will start CLIP process to get outpt spot  Larae Caison T

## 2015-01-05 NOTE — Progress Notes (Signed)
   Daily Progress Note  Vein mapping suggest brachial vein transposition might be possible.  Would start in L arm.  Earliest can be scheduled is Wednesday.  Will be by next week to talk further with the patient.  Hopefully, they have may some decisions in regards to PD vs HD by then.  Adele Barthel, MD [facility] [place] Office: [phone] Pager: [phone]  01/05/2015, 9:30 AM

## 2015-01-05 NOTE — Interval H&P Note (Signed)
History and Physical Interval Note:  01/05/2015 7:23 AM  Alison Gross  has presented today for surgery, with the diagnosis of Stage IV Chronic Kidney Disease N18.4  The various methods of treatment have been discussed with the patient and family. After consideration of risks, benefits and other options for treatment, the patient has consented to  Procedure(s): INSERTION OF DIALYSIS CATHETER (N/A) as a surgical intervention .  The patient's history has been reviewed, patient examined, no change in status, stable for surgery.  I have reviewed the patient's chart and labs.  Questions were answered to the patient's satisfaction.     Deitra Mayo

## 2015-01-05 NOTE — Progress Notes (Signed)
Patient Demographics  Alison Gross, is a 81 y.o. female, DOB - May 13, 1932, XHB:[id number]  Admit date - 01/01/2015   Admitting Physician Debbe Odea, MD  Outpatient Primary MD for the patient is Maximino Greenland, MD  LOS - 4   Chief Complaint  Patient presents with  . Shortness of Breath       Admission HPI/Brief narrative: 80 year old female with history of HTN, DM, chronic diastolic CHF, breast cancer status post bilateral mastectomies, PAF, chronic kidney disease stage 4(baseline creatinine~2), recurrent exudative right pleural effusion s/p right Pleurx catheter, right axillary/subclavian DVT, M spike presented to [facility] ED on 01/01/15 with worsening dyspnea, weight gain, weakness and worsening renal functions. She was seen by her cardiologist on 10/19 and Lasix was changed to [facility]. In the ED, creatinine up from 2.5-6.9, BNP 2364, hypotensive range blood pressures in the 80s-90s, elevated troponin and supratherapeutic INR 5.8. She was hospitalized for management of acute on chronic renal failure, acute on chronic diastolic CHF and hypotension. Advanced CHF team and nephrology are consulting. Patient had permacath inserted 10/28 anticipation of hemodialysis. Subjective:   [name] today has, No headache, No chest pain, No abdominal pain -  dyspnea is improving,  mild nausea after procedure.  Assessment & Plan    Principal Problem:   Acute on chronic renal failure (HCC) Active Problems:   MGUS (monoclonal gammopathy of unknown significance)   Ductal carcinoma in situ (DCIS) of left breast   Acute on chronic diastolic heart failure (HCC)   Type 2 diabetes mellitus with renal manifestations (HCC)   Hypotension   Substernal goiter   PAF (paroxysmal atrial fibrillation) (HCC)   Diastolic CHF, acute on chronic (HCC)   Stage 4 chronic kidney disease due to arterionephrosclerosis (HCC)   Pleural  effusion exudative   Supratherapeutic INR   ARF (acute renal failure) (HCC)  Acute on stage IV chronic kidney disease/hyperkalemia - Baseline creatinine ~2, Admitted with creatinine of 6.9. - Most likely related to Cardiorenal syndrome and acute diastolic dysfunction, less likely a due to MGUS(increased lambda chain may be related to her renal failure not causing it as per hematology). - renal ultrasound-no hydronephrosis ,Diffusely echogenic kidneys, compatible with medical renal disease - Nephrology consult appreciated, diuresis managed by nephrology, is from IV to by mouth Lasix today - Hematology consult appreciated, patient went for bone marrow biopsy 10/27 to evaluate for possible myeloma., Results pending - Permacath 10/28 with anticipation to start hemodialysis today.  Acute on chronic diastolic CHF - Patient is significantly volume overloaded in the setting of worsening renal function. - Repeat echo remains was preserved LV function - At this state improvement of volume status after initiation of dialysis  Recurrent right pleural effusion, s/p Pleurx catheter - cytology from her Right effusion has been negative x4, being drained daily, 550 mL over last 24 hours.   Paroxysmal atrial fibrillation - Currently in SB 50's - SR in the 60s. - INR supratherapeutic initially, corrected with vitamin K or need of surgical procedures include permacath insertion and bone marrow biopsy. - Continue to hold warfarin in anticipation for AV fistula/graft next week, meanwhile will start patient on Lovenox. -Continue amiodarone. Metoprolol on hold secondary to hypotension.   History of breast cancer status post bilateral  mastectomies 2014 - Oncology input appreciated. In remission.  MGUS - Oncology input appreciated. Do not believe that her breast cancer history  -Oncology consult appreciated, and had bone marrow biopsy is a day.  Iron deficiency anemia - on  Aranesp & IV Feraheme - follow  CBC's  Hypotension -  Holding blood pressure medications. Started on midodrine  Uncontrolled DM 2 with renal complications - Holding oral diabetics. Continue NovoLog SSI. Controlled.  Supratherapeutic INR - Admitted with INR of 5.8. Likely elevated from acute illness. Improved with vitamin K ,   Goiter - Abnormal TSH <0.010. May be related to amiodarone. free T4 is elevated at 3.48 , free T 3 wnl at 2.3, this picture is compatible with hyperthyroidism, discussed with Dr. Buddy Duty,  will follow with her as an outpatient(his office will call her with an appointment), will start on low-dose methimazole, may need to change amiodarone when patient is more stable as alternatives now giving her low blood pressure.  Code Status: Full  Family Communication: Husband and daughter at bedside  Disposition Plan: Remains in stepdown, pending further workup   Procedures  - Bone marrow biopsy by IR 10/27 - Patient has a right Pleurx catheter from prior to admission, which is drained daily. - Permacath insertion on 10/28  [place]   Cardiology CHF Nephrology Hematology Vascular surgery   Medications  Scheduled Meds: . amiodarone  200 mg Oral Daily  . anastrozole  1 mg Oral Daily  . darbepoetin (ARANESP) injection - NON-DIALYSIS  100 mcg Subcutaneous Q Tue-1800  . feeding supplement (ENSURE ENLIVE)  237 mL Oral BID BM  . ferumoxytol  510 mg Intravenous Weekly  . furosemide  160 mg Oral TID  . insulin aspart  0-9 Units Subcutaneous TID WC  . methimazole  20 mg Oral Daily  . midodrine  10 mg Oral BID WC  . multivitamin  1 tablet Oral QHS  . sodium chloride  3 mL Intravenous Q12H  . thiamine  100 mg Oral Daily   Continuous Infusions:  PRN Meds:.sodium chloride, acetaminophen, albuterol, ondansetron (ZOFRAN) IV, oxyCODONE-acetaminophen, sodium chloride  DVT Prophylaxis  on lovenox  Lab Results  Component Value Date   PLT 317 01/05/2015    Antibiotics    Anti-infectives    Start      Dose/Rate Route Frequency Ordered Stop   01/05/15 1230  [MAR Hold]  cefUROXime (ZINACEF) 1.5 g in dextrose 5 % 50 mL IVPB     (MAR Hold since 01/05/15 0625)   1.5 g 100 mL/hr over 30 Minutes Intravenous To [facility] 01/04/15 1131 01/05/15 0830          Objective:   Filed Vitals:   01/05/15 0900 01/05/15 0915 01/05/15 0930 01/05/15 0945  BP: 92/41 96/41 85/41  91/44  Pulse: 66 66 66 66  Temp:   98.7 F (37.1 C) 97.6 F (36.4 C)  TempSrc:    Oral  Resp: 16 15 15 17   Height:      Weight:      SpO2: 98% 98% 96%     Wt Readings from Last 3 Encounters:  01/05/15 57.3 kg (126 lb 5.2 oz)  12/28/14 60.328 kg (133 lb)  12/11/14 58.06 kg (128 lb)     Intake/Output Summary (Last 24 hours) at 01/05/15 1452 Last data filed at 01/05/15 1300  Gross per 24 hour  Intake   1085 ml  Output    700 ml  Net    385 ml     Physical Exam  General exam: Elderly frail female patient sitting up comfortably in bed without distress.  Respiratory system: Reduced breath sounds in the bases with scattered occasional bibasal crackles. Rest of lung fields clear to auscultation. No increased work of breathing. Cardiovascular system: S1 & S2 heard, RRR. No JVD, murmurs, gallops, clicks or pedal edema.Telemetry: SB 50s-SR 60s  Gastrointestinal system: Abdomen is nondistended, soft and nontender. Normal bowel sounds heard. Central nervous system: Alert and oriented. No focal neurological deficits. Extremities: Symmetric 5 x 5 power.   Data Review   Micro Results Recent Results (from the past 240 hour(s))  MRSA PCR Screening     Status: None   Collection Time: 01/01/15  3:10 PM  Result Value Ref Range Status   MRSA by PCR NEGATIVE NEGATIVE Final    Comment:        The GeneXpert MRSA Assay (FDA approved for NASAL specimens only), is one component of a comprehensive MRSA colonization surveillance program. It is not intended to diagnose MRSA infection nor to guide or monitor  treatment for MRSA infections.   Urine culture     Status: None   Collection Time: 01/01/15  4:46 PM  Result Value Ref Range Status   Specimen Description URINE, CLEAN CATCH  Final   Special Requests NONE  Final   Culture MULTIPLE SPECIES PRESENT, SUGGEST RECOLLECTION  Final   Report Status 01/03/2015 FINAL  Final  Surgical pcr screen     Status: None   Collection Time: 01/05/15  5:47 AM  Result Value Ref Range Status   MRSA, PCR NEGATIVE NEGATIVE Final   Staphylococcus aureus NEGATIVE NEGATIVE Final    Comment:        The Xpert SA Assay (FDA approved for NASAL specimens in patients over 53 years of age), is one component of a comprehensive surveillance program.  Test performance has been validated by [facility] for patients greater than or equal to 60 year old. It is not intended to diagnose infection nor to guide or monitor treatment.     Radiology Reports Dg Chest 2 View  01/01/2015  CLINICAL DATA:  Shortness of Breath EXAM: CHEST  2 VIEW COMPARISON:  December 28, 2014 chest radiograph and CT of the neck and upper chest September 21, 2014 FINDINGS: There remain small pleural effusions bilaterally with bibasilar atelectasis. There is a drainage catheter on the left, unchanged in position. No pneumothorax. Heart is enlarged with pulmonary vascular within normal limits. The large right substernal mass, noted previously to represent marked thyroid enlargement, is again noted and stable. No adenopathy appreciable. IMPRESSION: The marked enlargement of the right lobe of the thyroid is again noted. There are small pleural effusions bilaterally with bibasilar atelectasis. In comparison with recent chest radiograph, there is slightly less atelectasis and effusion on the left and right sides. No new opacity. No change in cardiac silhouette. Electronically Signed   By: [name] III M.D.   On: 01/01/2015 12:48   Dg Chest 2 View  12/28/2014  CLINICAL DATA:  Pleural effusion. EXAM:  CHEST  2 VIEW COMPARISON:  12/11/2014.  11/16/2014.  CT 09/21/2014 FINDINGS: Stable right paratracheal substernal mass consistent previously thyroid lesion. Stable cardiomegaly. No focal acute infiltrate. Scratch Stable small right pleural effusion. Stable small left pleural effusion. Right chest tube in stable position. No pneumothorax. Surgical clips noted over the right chest. IMPRESSION: 1. Right chest tube in stable position. Stable bilateral pleural effusions. 2. Scratched stable large right paratracheal thyroid mass. Electronically Signed   By: Marcello Moores  Register   On: 12/28/2014 12:32   Dg Chest 2 View  12/11/2014  CLINICAL DATA:  Followup pleural effusions. Breast carcinoma. Goiter. EXAM: CHEST  2 VIEW COMPARISON:  11/16/2014 and previous CT on 09/19/2014 FINDINGS: Right chest tube remains in place. Small right pleural effusion shows mild decrease in size since previous study. No pneumothorax visualized. Small left pleural effusion remains stable. Heart size remains stable. Large right mediastinal mass is unchanged and consistent with substernal goiter shown on recent CT. IMPRESSION: Mild decrease in right pleural effusion. No pneumothorax visualized. Stable small left pleural effusion. Stable mediastinal mass, consistent with substernal goiter showed on recent CT. Electronically Signed   By: Earle Gell M.D.   On: 12/11/2014 14:38   Dg Chest Left Decubitus  01/03/2015  CLINICAL DATA:  Pleural effusion. EXAM: CHEST - LEFT DECUBITUS COMPARISON:  January 01, 2015. FINDINGS: Small free flowing left pleural effusion is noted. Stable large amount of right thyroid lobe. Pleural drainage catheter is noted on the right. IMPRESSION: Small free flowing left pleural effusion is noted. Electronically Signed   By: Marijo Conception, M.D.   On: 01/03/2015 15:28   US Renal  01/01/2015  CLINICAL DATA:  Acute renal failure.  Diabetes. EXAM: RENAL / URINARY TRACT ULTRASOUND COMPLETE COMPARISON:  None. FINDINGS: Right  Kidney: Length: 9.1 cm.  Diffusely echogenic.  No hydronephrosis. Left Kidney: Length: 9.4 cm. Diffusely echogenic. No hydronephrosis. Several tiny cysts. Bladder: Appears normal for degree of bladder distention. A small amount of free peritoneal fluid is noted. IMPRESSION: 1. Diffusely echogenic kidneys, compatible with medical renal disease. 2. No hydronephrosis. 3. Small amount of ascites. Electronically Signed   By: Claudie Revering M.D.   On: 01/01/2015 16:25   Ct Biopsy  01/04/2015  CLINICAL DATA:  81 year old female with a history of monoclonal gammopathy. Presents for bone marrow biopsy EXAM: CT-GUIDED BIOPSY bone marrow MEDICATIONS AND MEDICAL HISTORY: Versed 0.5 mg, Fentanyl 25 mcg. Additional Medications: None. ANESTHESIA/SEDATION: Moderate sedation time: 15 minutes PROCEDURE: The procedure risks, benefits, and alternatives were explained to the patient. Questions regarding the procedure were encouraged and answered. The patient understands and consents to the procedure. Scout CT of the pelvis was performed for surgical planning purposes. The posterior pelvis was prepped with Betadinein a sterile fashion, and a sterile drape was applied covering the operative field. A sterile gown and sterile gloves were used for the procedure. Local anesthesia was provided with 1% Lidocaine. We targeted the right posterior iliac bone for biopsy. The skin and subcutaneous tissues were infiltrated with 1% lidocaine without epinephrine. A small stab incision was made with an 11 blade scalpel, and an 11 gauge Murphy needle was advanced with CT guidance to the posterior cortex. Manual forced was used to advance the needle through the posterior cortex and the stylet was removed. A bone marrow aspirate was retrieved and passed to a cytotechnologist in the room. The Murphy needle was then advanced without the stylet for a core biopsy. The core biopsy was retrieved and also passed to a cytotechnologist. Manual pressure was used  for hemostasis and a sterile dressing was placed. No complications were encountered no significant blood loss was encountered. Patient tolerated the procedure well and remained hemodynamically stable throughout. FINDINGS: Scout image demonstrates safe approach to posterior iliac bone. Images during the case demonstrate placement of 11 gauge Murphy needle COMPLICATIONS: None IMPRESSION: Status post CT-guided bone marrow biopsy, with tissue specimen sent to pathology for complete histopathologic analysis Signed, Dulcy Fanny. Earleen Newport, DO [facility] Radiology Electronically Signed   By: Corrie Mckusick D.O.   On: 01/04/2015 10:35   Dg Chest Port 1 View  01/05/2015  CLINICAL DATA:  Status post dialysis catheter insertion. EXAM: PORTABLE CHEST 1 VIEW COMPARISON:  January 04, 2015. FINDINGS: Stable cardiomediastinal silhouette. Large right superior mediastinal mass is unchanged. Interval placement of right internal jugular dialysis catheter with distal tips in the expected position of the SVC. No pneumothorax is noted. Mild bilateral pleural effusions are noted. Bibasilar opacities are noted concerning for edema or atelectasis. Surgical clips are noted in the axillary regions bilaterally. Bony thorax is unremarkable. IMPRESSION: Interval placement of right internal jugular dialysis catheter with distal tips in the expected position of the SVC. No pneumothorax is noted. Stable large right superior mediastinal mass is noted. Mild bilateral pleural effusions are noted, with bibasilar opacities concerning for edema or atelectasis. Electronically Signed   By: Marijo Conception, M.D.   On: 01/05/2015 09:24   Dg Chest Port 1 View  01/04/2015  CLINICAL DATA:  Right pneumothorax. EXAM: PORTABLE CHEST 1 VIEW COMPARISON:  Left lateral decubitus chest radiograph and bone survey 01/03/2015. Chest radiographs 01/01/2015. FINDINGS: Right superior mediastinal mass is again noted consistent  with markedly enlarged thyroid. The cardiac silhouette remains mildly enlarged. Mild central pulmonary vascular congestion remains. A right-sided pleural catheter remains in place. There are persistent small bilateral pleural effusions, unchanged from 01/01/2015. Patchy bibasilar opacities also do not appear significantly changed. No pneumothorax is identified. IMPRESSION: 1. Unchanged small bilateral pleural effusions and bibasilar atelectasis. 2. No pneumothorax identified. Electronically Signed   By: Logan Bores M.D.   On: 01/04/2015 07:37   Dg Bone Survey Met  01/03/2015  ADDENDUM REPORT: 01/03/2015 13:51 ADDENDUM: Critical Value/emergent results were discussed with the patient's provider Pacey Willadsen on 01/03/2015 at time 1:50 pm. Electronically Signed   By: Nolon Nations M.D.   On: 01/03/2015 13:51  01/03/2015  CLINICAL DATA:  History of MGUS with worsening renal function. Shortness of breath. Pain all over body. History of breast cancer. EXAM: METASTATIC BONE SURVEY COMPARISON:  07/27/2014 bone scan, PET-CT 09/18/2014, and metastatic survey 12/06/2012 FINDINGS: No lytic or blastic lesions are identified to suggest metastases or multiple myeloma deposits. Patient is a edentulous. Degenerative changes are seen in the cervical, thoracic, and lumbar spine. Significant disc height loss and sclerosis identified at L3-4. Previous ORIF of right distal tibia and fibular fractures. Small probable bone island identified within the distal third of the left radius. The left radius was not imaged previously. Superior mediastinal mass consistent with enlarged thyroid as seen on prior studies. There has been development of lucency at the right lung base. Patient has a right pleural catheter. There is small right pleural effusion. Suspect interval development of right basilar pneumothorax. Small left pleural effusion is present. There is left lower lobe infiltrate, stable in appearance. Status post bilateral  mastectomy and bilateral axillary node dissection. IMPRESSION: 1. Interval development of right basilar pneumothorax. 2. Persistent small right pleural effusion. 3. Persistent left lower lobe infiltrate and pleural effusion. 4. Large right mediastinal mass stable and consistent with enlarged thyroid. 5. No suspicious lytic or blastic lesions. Critical Value/emergent results will be telephoned to the patient's provider Kandie Keiper [facility] at the time of interpretation on date 01/03/2015 at time 1:44 pm. Electronically Signed: By: Nolon Nations M.D. On: 01/03/2015 13:44     CBC  Recent Labs Lab 01/01/15 1125 01/03/15 0341 01/04/15 0215 01/05/15 0545  WBC  4.8 5.0 5.9 6.9  HGB 8.9* 8.4* 7.9* 8.2*  HCT 28.3* 26.3* 24.9* 25.3*  PLT 319 287 319 317  MCV 86.3 86.5 85.9 86.3  MCH 27.1 27.6 27.2 28.0  MCHC 31.4 31.9 31.7 32.4  RDW 17.3* 17.0* 16.7* 16.8*  LYMPHSABS  --   --  0.8  --   MONOABS  --   --  0.7  --   EOSABS  --   --  0.3  --   BASOSABS  --   --  0.0  --     Chemistries   Recent Labs Lab 01/01/15 1125 01/02/15 0327 01/03/15 0341 01/04/15 0215 01/05/15 0545  NA 139 141 134* 135 136  K 5.9* 5.1 4.7 4.4 4.6  CL 110 110 104 104 102  CO2 20* 20* 21* 23 24  GLUCOSE 127* 112* 108* 106* 113*  BUN 159* 159* 159* 156* 172*  CREATININE 6.99* 6.58* 6.13* 6.01* 6.19*  CALCIUM 8.9 8.8* 8.5* 8.8* 9.0  AST 28  --   --   --   --   ALT 30  --   --   --   --   ALKPHOS 93  --   --   --   --   BILITOT 0.3  --   --   --   --    ------------------------------------------------------------------------------------------------------------------ estimated creatinine clearance is 6.5 mL/min (by C-G formula based on Cr of 6.19). ------------------------------------------------------------------------------------------------------------------ No results for input(s): HGBA1C in the last 72  hours. ------------------------------------------------------------------------------------------------------------------ No results for input(s): CHOL, HDL, LDLCALC, TRIG, CHOLHDL, LDLDIRECT in the last 72 hours. ------------------------------------------------------------------------------------------------------------------  Recent Labs  01/02/15 1520  T3FREE 2.3   ------------------------------------------------------------------------------------------------------------------ No results for input(s): VITAMINB12, FOLATE, FERRITIN, TIBC, IRON, RETICCTPCT in the last 72 hours.  Coagulation profile  Recent Labs Lab 01/01/15 1535 01/02/15 0327 01/03/15 0341 01/04/15 0215 01/05/15 0545  INR 5.88* 5.73* 5.15* 1.91* 1.31    No results for input(s): DDIMER in the last 72 hours.  Cardiac Enzymes No results for input(s): CKMB, TROPONINI, MYOGLOBIN in the last 168 hours.  Invalid input(s): CK ------------------------------------------------------------------------------------------------------------------ Invalid input(s): POCBNP     Time Spent in minutes   40 minutes   Kamren Heintzelman M.D on 01/05/2015 at 2:52 PM  Between 7am to 7pm - Pager - [phone]  After 7pm go to [email or web address] - password [facility]  [facility]   Office  [phone]

## 2015-01-05 NOTE — Anesthesia Preprocedure Evaluation (Signed)
Anesthesia Evaluation  Patient identified by MRN, date of birth, ID band Patient awake    Reviewed: Allergy & Precautions, NPO status , Patient's Chart, lab work & pertinent test results  Airway Mallampati: I  TM Distance: >3 FB Neck ROM: Full    Dental   Pulmonary former smoker,     + decreased breath sounds      Cardiovascular hypertension, Pt. on medications +CHF  Normal cardiovascular exam+ dysrhythmias Atrial Fibrillation  Rhythm:Regular Rate:Normal     Neuro/Psych    GI/Hepatic GERD  Medicated and Controlled,  Endo/Other  diabetes, Type 2, Oral Hypoglycemic Agents  Renal/GU CRFRenal disease     Musculoskeletal   Abdominal   Peds  Hematology  (+) anemia ,   Anesthesia Other Findings   Reproductive/Obstetrics                             Anesthesia Physical  Anesthesia Plan  ASA: III  Anesthesia Plan: MAC   Post-op Pain Management:    Induction: Intravenous  Airway Management Planned: Natural Airway  Additional Equipment:   Intra-op Plan:   Post-operative Plan:   Informed Consent: I have reviewed the patients History and Physical, chart, labs and discussed the procedure including the risks, benefits and alternatives for the proposed anesthesia with the patient or authorized representative who has indicated his/her understanding and acceptance.   Dental advisory given  Plan Discussed with: CRNA  Anesthesia Plan Comments:         Anesthesia Quick Evaluation

## 2015-01-05 NOTE — Progress Notes (Signed)
ANTICOAGULATION CONSULT NOTE - Initial Consult  Pharmacy Consult for Heparin Indication: atrial fibrillation  No Known Allergies  Patient Measurements: Height: '5\' 5"'$  (165.1 cm) Weight: 126 lb 5.2 oz (57.3 kg) IBW/kg (Calculated) : 57  Vital Signs: Temp: 97.6 F (36.4 C) (10/28 0945) Temp Source: Oral (10/28 0945) BP: 91/44 mmHg (10/28 0945) Pulse Rate: 66 (10/28 0945)  Labs:  Recent Labs  01/03/15 0341 01/04/15 0215 01/05/15 0545  HGB 8.4* 7.9* 8.2*  HCT 26.3* 24.9* 25.3*  PLT 287 319 317  LABPROT 45.9* 21.8* 16.4*  INR 5.15* 1.91* 1.31  CREATININE 6.13* 6.01* 6.19*    Assessment: 82yoF admitted with SOB, recurrent exudative pleural effusion and acute on CKD (stage IV at baseline). Was on warfarin PTA that was supra therapeutic on admission and was given vitamin K due to need for placement of permcath. Received first IHD on 10/28 and planning to hold coumadin and anticoagulate with hep until she decision made about long term dialysis access.   10/28: INR 1.31 (Vitamin K IV and PP received on 10/26)  Goal of Therapy:  Heparin level 0.3-0.7 units/ml Monitor platelets by anticoagulation protocol: Yes    Plan:  1. Start heparin infusion at 850 units/hr units/hr - no bolus per MD and INR 1.3 2. HL in 8 hours 3. Daily HL, CBC 4. Monitor sxs of bleeding   Vincenza Hews, PharmD, BCPS 01/05/2015, 4:38 PM Pager: [phone]

## 2015-01-05 NOTE — Progress Notes (Signed)
I met today with Ms. Boddy in hemodialysis. She denies any complaints at this time.  She reports being very tired and wanting to meet up in order to talk in the next couple of days when her family can be present as well.  I then met briefly with her husband in her hospital room.  He reports that Sunday would work best for the family to meet.  I provided him with my card and he will call and let me know what time works on Sunday after he has a chance to talk to their daughters.  Please let us know if we can be of further assistance in the interim.  Micheline Rough, MD [place] [facility] [phone]

## 2015-01-05 NOTE — Op Note (Signed)
01/05/2015  PREOP DIAGNOSIS: Chronic kidney disease  POSTOP DIAGNOSIS: Chronic kidney disease  PROCEDURE: Ultrasound guided placement of right IJ Diatek catheter (19 cm)  SURGEON: Judeth Cornfield. Scot Dock, MD, FACS  ASSIST: none  ANESTHESIA: local with sedation   EBL: minimal  FINDINGS: patent right IJ  INDICATIONS: HD  TECHNIQUE: The patient was taken to the operating room and sedated by anesthesia. The neck and upper chest were prepped and draped in the usual sterile fashion. After the skin was anesthetized with 1% lidocaine, and under ultrasound guidance, the right IJ was cannulated and a guidewire introduced into the superior vena cava under fluoroscopic control. The tract over the wire was dilated and then the dilator and peel-away sheath were passed over the wire and the wire and dilator removed. The catheter was passed through the peel-away sheath and positioned in the right atrium. The exit site for the catheter was selected and the skin anesthetized between the 2 areas. The catheter was then brought through the tunnel, cut to the appropriate length, and the distal ports were attached. Both ports withdrew easily, were then flushed with heparinized saline and filled with concentrated heparin. The catheter was secured at its exit site with a 3-0 nylon suture. The IJ cannulation site was closed with a 4-0 subcuticular stitch. A sterile dressing was applied. The patient tolerated the procedure well and was transferred to the recovery room in stable condition. All needle and sponge counts were correct.  Deitra Mayo, MD, FACS [facility] [place]: 01/05/2015 DATE OF DICTATION: 01/05/2015

## 2015-01-05 NOTE — Progress Notes (Signed)
Pt transported to OR for dialysis tunneled catheter placement. Pt belongings still in 2c17.

## 2015-01-05 NOTE — H&P (View-Only) (Signed)
 CONSULT NOTE   MRN : [id number]  Reason for Consult: ESRD CKD stage IV Referring Physician: Dr. Mattingly  History of Present Illness: 82 y/o female admitted with SOB and Recurrent exudative pleural effusion s/p pleurx cath.  She came to the ED 01/02/2015 and in the emergency department her creatinine has increased from a baseline of 2.5 to 6.9. Patient denies prior access procedures.  She denies any central venous catheterization or PPM placement.    We have been asked to provide her with a diatek catheter tomorrow and in the near future permanent HD access.  Her current INR in 1.91 at 0215.  Coumadin has been held.       Current Facility-Administered Medications  Medication Dose Route Frequency Provider Last Rate Last Dose  . 0.9 %  sodium chloride infusion  250 mL Intravenous PRN Alison L York, PA-C      . acetaminophen (TYLENOL) tablet 650 mg  650 mg Oral Q4H PRN Alison L York, PA-C   650 mg at 01/01/15 2153  . albuterol (PROVENTIL) (2.5 MG/3ML) 0.083% nebulizer solution 3 mL  3 mL Inhalation Q4H PRN Alison L York, PA-C      . amiodarone (PACERONE) tablet 200 mg  200 mg Oral Daily Alison L York, PA-C   200 mg at 01/03/15 0919  . anastrozole (ARIMIDEX) tablet 1 mg  1 mg Oral Daily Alison L York, PA-C   1 mg at 01/03/15 0919  . Darbepoetin Alfa (ARANESP) injection 100 mcg  100 mcg Subcutaneous Q Tue-1800 Alison Mattingly, MD   100 mcg at 01/02/15 1751  . feeding supplement (ENSURE ENLIVE) (ENSURE ENLIVE) liquid 237 mL  237 mL Oral BID BM Alison L York, PA-C   237 mL at 01/03/15 1604  . fentaNYL (SUBLIMAZE) 100 MCG/2ML injection           . ferumoxytol (FERAHEME) 510 mg in sodium chloride 0.9 % 100 mL IVPB  510 mg Intravenous Weekly Alison Mattingly, MD   510 mg at 01/02/15 1200  . furosemide (LASIX) 160 mg in dextrose 5 % 50 mL IVPB  160 mg Intravenous 3 times per day Alison Mattingly, MD   160 mg at 01/04/15 0546  . insulin aspart (novoLOG) injection 0-9 Units  0-9  Units Subcutaneous TID WC Alison L York, PA-C   1 Units at 01/03/15 1838  . lidocaine (XYLOCAINE) 1 % (with pres) injection           . methimazole (TAPAZOLE) tablet 20 mg  20 mg Oral Daily Alison S Elgergawy, MD   20 mg at 01/03/15 1410  . midazolam (VERSED) 2 MG/2ML injection           . ondansetron (ZOFRAN) injection 4 mg  4 mg Intravenous Q6H PRN Alison L York, PA-C   4 mg at 01/04/15 0742  . sodium chloride 0.9 % injection 3 mL  3 mL Intravenous Q12H Alison L York, PA-C   3 mL at 01/03/15 2300  . sodium chloride 0.9 % injection 3 mL  3 mL Intravenous PRN Alison L York, PA-C      . thiamine (VITAMIN B-1) tablet 100 mg  100 mg Oral Daily Alison L York, PA-C   100 mg at 01/03/15 0919  . Warfarin - Pharmacist Dosing Inpatient   Does not apply q1800 Alison S Elgergawy, MD   Stopped at 01/03/15 1800    Pt meds include: Statin :No Betablocker: Yes ASA: No Other anticoagulants/antiplatelets: Coumadin on hold A fib  Past Medical History    Diagnosis Date  . Hypercholesterolemia     takes Crestor daily  . Thyroid disease     had iodine radiation  . Hypertension     takes Hyzaar daily  . Dysrhythmia     takes Carvedilol daily  . Pneumonia     history of;last time about 4-50yr ago  . GERD (gastroesophageal reflux disease)     takes Protonix daily  . Gastric ulcer   . History of blood transfusion   . History of colon polyps   . Anemia     takes iron pill daily  . Diabetes mellitus without complication ([facility]     takes Tradjenta daily  . History of radiation therapy 07/12/12-08/26/12    right breast/  . Paroxysmal atrial fibrillation ([facility]     PCP EKG 09/27/2013: A. Fibrillation.. EKG 09/29/2013: S. Tach.  . Breast cancer ([facility] 04/12/12    right-pos lymph node/left-DCIS  . Shortness of breath on exertion   . Bruit of left carotid artery   . Acute upper GI bleeding 01/24/2012  . MGUS (monoclonal gammopathy of unknown significance) 04/21/2013  . Osteoporosis 11/29/2013  .  Recurrent right pleural effusion 07/26/2014  . Stage III chronic kidney disease 07/26/2014  . Goiter 07/27/2014  . Substernal goiter 12/23/2012  . Pleural effusion, right 12/23/2012  . Type 2 diabetes mellitus with renal manifestations ([facility] 07/26/2014  . Dysphonia 09/20/2014  . Vocal cord paralysis     Suspected due to massive substernal goiter  . CHF (congestive heart failure) ([facility]     Past Surgical History  Procedure Laterality Date  . Carpal tunnel release Left   . Breast biopsy    . Esophagogastroduodenoscopy  01/24/2012    Procedure: ESOPHAGOGASTRODUODENOSCOPY (EGD);  Surgeon: MJeryl Columbia MD;  [facility];  Service: Endoscopy;  Laterality: N/A;  . Thyroid surgery      "removed goiter"  . Cystectomy      from back of neck  . Knee surgery      right with rods  . Esophagogastroduodenoscopy    . Colonoscopy    . Total mastectomy Bilateral 05/10/2012    Procedure: RIGHT modified mastectomy; LEFT total mastectomy;  Surgeon: CHaywood Lasso MD;  [facility]  Service: General;  Laterality: Bilateral;  . Axillary sentinel node biopsy Right 05/10/2012    Procedure: AXILLARY SENTINEL lymph  NODE BIOPSY;  Surgeon: CHaywood Lasso MD;  [facility]  Service: General;  Laterality: Right;  nuclear medicine injection right side  7:00 am   . Abdominal hysterectomy      fibroids, with bilateral SO  . Chest tube insertion Right 09/27/2014    Procedure: INSERTION OF RIGHT PLEURAL DRAINAGE CATHETER;  Surgeon: EGrace Isaac MD;  [facility]  Service: Thoracic;  Laterality: Right;    Social History Social History  Substance Use Topics  . Smoking status: Former Smoker -- 0.00 packs/day    Types: Cigarettes    Quit date: 07/26/1971  . Smokeless tobacco: Never Used  . Alcohol Use: No    Family History Family History  Problem Relation Age of Onset  . Brain cancer Mother   . Aneurysm Father   . Diabetes Sister   . Diabetes Brother   . Diabetes Sister   .  Diabetes Brother   . Heart failure Sister     No Known Allergies   REVIEW OF SYSTEMS  General: [ ] Weight loss, [ ] Fever, [ ] chills Neurologic: [ ] Dizziness, [ ]  Blackouts, [ ] Seizure [ ] Stroke, [ ] "Mini stroke", [ ] Slurred speech, [ ] Temporary blindness; [ ] weakness in arms or legs, [ ] Hoarseness [ ] Dysphagia Cardiac: [ ] Chest pain/pressure, [ ] Shortness of breath at rest [ ] Shortness of breath with exertion, [ ] Atrial fibrillation or irregular heartbeat  Vascular: [ ] Pain in legs with walking, [ ] Pain in legs at rest, [ ] Pain in legs at night,  [ ] Non-healing ulcer, [ ] Blood clot in vein/DVT,   Pulmonary: [ ] Home oxygen, [ ] Productive cough, [ ] Coughing up blood, [ ] Asthma,  [ ] Wheezing [ ] COPD Musculoskeletal:  [ ] Arthritis, [ ] Low back pain, [ ] Joint pain Hematologic: [ ] Easy Bruising, [ ] Anemia; [ ] Hepatitis Gastrointestinal: [ ] Blood in stool, [ ] Gastroesophageal Reflux/heartburn, Urinary: [ ] chronic Kidney disease, [ ] on HD - [ ] MWF or [ ] TTHS, [ ] Burning with urination, [ ] Difficulty urinating Skin: [ ] Rashes, [ ] Wounds Psychological: [ ] Anxiety, [ ] Depression  Physical Examination Filed Vitals:   01/04/15 0000 01/04/15 0400 01/04/15 0545 01/04/15 0746  BP: 99/43 111/47  95/40  Pulse: 69 76  73  Temp: 98.3 F (36.8 C) 98.5 F (36.9 C)  99 F (37.2 C)  TempSrc: Oral Oral  Oral  Resp: 16 17  17  Height:      Weight:   129 lb 13.6 oz (58.9 kg)   SpO2: 100% 99%  100%   Body mass index is 21.61 kg/(m^2).  General:  WDWN in NAD HENT: WNL Eyes: Pupils equal Pulmonary: normal non-labored breathing , without Rales, rhonchi,  wheezing Cardiac: RRR, without  Murmurs, rubs or gallops; No carotid bruits Abdomen: soft, NT, no masses Skin: no rashes, ulcers noted;  no Gangrene , no cellulitis; right-sided pleural catheter remains in place Vascular Exam/Pulses:[place] radial and brachial pulses bilaterally Musculoskeletal: no muscle  wasting or atrophy; no edema  Neurologic: A&O X 3; Appropriate Affect ; SENSATION: normal; MOTOR FUNCTION: 5/5 Symmetric; Speech is fluent/normal Psychiatric: Judgment intact, Mood & affect appropriate for pt's clinical situation Lymph : No Cervical, Axillary, or Inguinal lymphadenopathy    Laboratory: CBC:    Component Value Date/Time   WBC 5.9 01/04/2015 0215   WBC 4.5 09/18/2014 0946   RBC 2.90* 01/04/2015 0215   RBC 3.34* 09/18/2014 0946   RBC 2.80* 01/24/2012 1011   HGB 7.9* 01/04/2015 0215   HGB 9.6* 09/18/2014 0946   HCT 24.9* 01/04/2015 0215   HCT 29.4* 09/18/2014 0946   PLT 319 01/04/2015 0215   PLT 319 09/18/2014 0946   MCV 85.9 01/04/2015 0215   MCV 87.9 09/18/2014 0946   MCH 27.2 01/04/2015 0215   MCH 28.7 09/18/2014 0946   MCHC 31.7 01/04/2015 0215   MCHC 32.7 09/18/2014 0946   RDW 16.7* 01/04/2015 0215   RDW 16.8* 09/18/2014 0946   LYMPHSABS 0.8 01/04/2015 0215   LYMPHSABS 0.6* 09/18/2014 0946   MONOABS 0.7 01/04/2015 0215   MONOABS 0.6 09/18/2014 0946   EOSABS 0.3 01/04/2015 0215   EOSABS 0.3 09/18/2014 0946   BASOSABS 0.0 01/04/2015 0215   BASOSABS 0.1 09/18/2014 0946    BMP:    Component Value Date/Time   NA 135 01/04/2015 0215   NA 143 09/18/2014 0946   K 4.4 01/04/2015 0215   K 4.0 09/18/2014 0946   CL 104 01/04/2015 0215     CL 107 08/05/2012 1009   CO2 23 01/04/2015 0215   CO2 30* 09/18/2014 0946   GLUCOSE 106* 01/04/2015 0215   GLUCOSE 94 09/18/2014 0946   GLUCOSE 128* 08/05/2012 1009   BUN 156* 01/04/2015 0215   BUN 31.2* 09/18/2014 0946   CREATININE 6.01* 01/04/2015 0215   CREATININE 1.7* 09/18/2014 0946   CALCIUM 8.8* 01/04/2015 0215   CALCIUM 11.8* 09/18/2014 0946   CALCIUM 10.7* 07/27/2014 0830   GFRNONAA 6* 01/04/2015 0215   GFRAA 7* 01/04/2015 0215    Coagulation: Lab Results  Component Value Date   INR 1.91* 01/04/2015   INR 5.15* 01/03/2015   INR 5.73* 01/02/2015   No results found for: PTT  Lipids: No results  found for: CHOL, TRIG, HDL, CHOLHDL, VLDL, LDLCALC, LDLDIRECT   Radiology: Dg Chest Left Decubitus  01/03/2015  CLINICAL DATA:  Pleural effusion. EXAM: CHEST - LEFT DECUBITUS COMPARISON:  January 01, 2015. FINDINGS: Small free flowing left pleural effusion is noted. Stable large amount of right thyroid lobe. Pleural drainage catheter is noted on the right. IMPRESSION: Small free flowing left pleural effusion is noted. Electronically Signed   By: Marijo Conception, M.D.   On: 01/03/2015 15:28   Ct Biopsy  01/04/2015  CLINICAL DATA:  81 year old female with a history of monoclonal gammopathy. Presents for bone marrow biopsy EXAM: CT-GUIDED BIOPSY bone marrow MEDICATIONS AND MEDICAL HISTORY: Versed 0.5 mg, Fentanyl 25 mcg. Additional Medications: None. ANESTHESIA/SEDATION: Moderate sedation time: 15 minutes PROCEDURE: The procedure risks, benefits, and alternatives were explained to the patient. Questions regarding the procedure were encouraged and answered. The patient understands and consents to the procedure. Scout CT of the pelvis was performed for surgical planning purposes. The posterior pelvis was prepped with Betadinein a sterile fashion, and a sterile drape was applied covering the operative field. A sterile gown and sterile gloves were used for the procedure. Local anesthesia was provided with 1% Lidocaine. We targeted the right posterior iliac bone for biopsy. The skin and subcutaneous tissues were infiltrated with 1% lidocaine without epinephrine. A small stab incision was made with an 11 blade scalpel, and an 11 gauge Murphy needle was advanced with CT guidance to the posterior cortex. Manual forced was used to advance the needle through the posterior cortex and the stylet was removed. A bone marrow aspirate was retrieved and passed to a cytotechnologist in the room. The Murphy needle was then advanced without the stylet for a core biopsy. The core biopsy was retrieved and also passed to a  cytotechnologist. Manual pressure was used for hemostasis and a sterile dressing was placed. No complications were encountered no significant blood loss was encountered. Patient tolerated the procedure well and remained hemodynamically stable throughout. FINDINGS: Scout image demonstrates safe approach to posterior iliac bone. Images during the case demonstrate placement of 11 gauge Murphy needle COMPLICATIONS: None IMPRESSION: Status post CT-guided bone marrow biopsy, with tissue specimen sent to pathology for complete histopathologic analysis Signed, Dulcy Fanny. Earleen Newport, DO [facility] Electronically Signed   By: Corrie Mckusick D.O.   On: 01/04/2015 10:35   Dg Chest Port 1 View  01/04/2015  CLINICAL DATA:  Right pneumothorax. EXAM: PORTABLE CHEST 1 VIEW COMPARISON:  Left lateral decubitus chest radiograph and bone survey 01/03/2015. Chest radiographs 01/01/2015. FINDINGS: Right superior mediastinal mass is again noted consistent with markedly enlarged thyroid. The cardiac silhouette remains mildly enlarged. Mild central pulmonary vascular congestion remains. A right-sided pleural catheter remains in place. There are  persistent small bilateral pleural effusions, unchanged from 01/01/2015. Patchy bibasilar opacities also do not appear significantly changed. No pneumothorax is identified. IMPRESSION: 1. Unchanged small bilateral pleural effusions and bibasilar atelectasis. 2. No pneumothorax identified. Electronically Signed   By: Logan Bores M.D.   On: 01/04/2015 07:37   Dg Bone Survey Met  01/03/2015  ADDENDUM REPORT: 01/03/2015 13:51 ADDENDUM: Critical Value/emergent results were discussed with the patient's provider Alison Gross on 01/03/2015 at time 1:50 pm. Electronically Signed   By: Nolon Nations M.D.   On: 01/03/2015 13:51  01/03/2015  CLINICAL DATA:  History of MGUS with worsening renal function. Shortness of breath. Pain all over body.  History of breast cancer. EXAM: METASTATIC BONE SURVEY COMPARISON:  07/27/2014 bone scan, PET-CT 09/18/2014, and metastatic survey 12/06/2012 FINDINGS: No lytic or blastic lesions are identified to suggest metastases or multiple myeloma deposits. Patient is a edentulous. Degenerative changes are seen in the cervical, thoracic, and lumbar spine. Significant disc height loss and sclerosis identified at L3-4. Previous ORIF of right distal tibia and fibular fractures. Small probable bone island identified within the distal third of the left radius. The left radius was not imaged previously. Superior mediastinal mass consistent with enlarged thyroid as seen on prior studies. There has been development of lucency at the right lung base. Patient has a right pleural catheter. There is small right pleural effusion. Suspect interval development of right basilar pneumothorax. Small left pleural effusion is present. There is left lower lobe infiltrate, stable in appearance. Status post bilateral mastectomy and bilateral axillary node dissection. IMPRESSION: 1. Interval development of right basilar pneumothorax. 2. Persistent small right pleural effusion. 3. Persistent left lower lobe infiltrate and pleural effusion. 4. Large right mediastinal mass stable and consistent with enlarged thyroid. 5. No suspicious lytic or blastic lesions. Critical Value/emergent results will be telephoned to the patient's provider Alison [facility] at the time of interpretation on date 01/03/2015 at time 1:44 pm. Electronically Signed: By: Nolon Nations M.D. On: 01/03/2015 13:44       Non-Invasive Vascular Imaging: BUE Vein mapping (01/03/15): no adequate vein for access   ASSESSMENT/PLAN:  1. Acute on CKD stage IV 2. Acute on chronic diastolic CHF 3. Recurrent R pleural effusion 4. pAF 5. MGUS 6. Hypotension 7. Anticoagulation    CHF right-sided pleural catheter remains in place  Supra therapeutic INR at admission now  coumadin is on hold current INR 1.91  Plan for diatek placement once INR is less than 1.5 hopefully tomorrow.  She is interested in discussing peritoneal dialysis options too.  Continue to hold coumadin, vein mapping ordered she is right hand dominant.    Laurence Slate [facility] 01/04/2015 9:20 AM  Addendum  I have independently interviewed and examined the patient, and I agree with the physician assistant's findings.  Pt and husband agree that she needs to do dialysis but aren't certain whether she would proceed with HD vs PD long-term wise.  Additional, education in regards to permanent access options recommended.  Will reorder BUE vein mapping for brachial vein evaluation, otherwise, her only option is an AVG.  Given marginal BP currently, SBP 80s, I doubt she is a good permanent access candidate if AVG if her only option.  Keep NPO.  If INR < 1.5 tomorrow, will proceed with [facility] placement.  Adele Barthel, MD [facility] [place] Office: [phone] Pager: [phone]  01/04/2015, 1:01 PM

## 2015-01-05 NOTE — Transfer of Care (Signed)
Immediate Anesthesia Transfer of Care Note  Patient: Alison Gross  Procedure(s) Performed: Procedure(s): INSERTION OF DIALYSIS CATHETER (N/A)  Patient Location: PACU  Anesthesia Type:MAC  Level of Consciousness: awake, alert , oriented, patient cooperative and responds to stimulation  Airway & Oxygen Therapy: Patient Spontanous Breathing and Patient connected to nasal cannula oxygen  Post-op Assessment: Report given to RN, Post -op Vital signs reviewed and stable, Patient moving all extremities and Patient moving all extremities X 4  Post vital signs: Reviewed and stable  Last Vitals:  Filed Vitals:   01/05/15 0400  BP: 88/40  Pulse: 64  Temp: 37.1 C  Resp: 15    Complications: No apparent anesthesia complications

## 2015-01-06 ENCOUNTER — Inpatient Hospital Stay (HOSPITAL_COMMUNITY): Payer: Commercial Managed Care - HMO

## 2015-01-06 LAB — HEPARIN LEVEL (UNFRACTIONATED)

## 2015-01-06 LAB — HEPATITIS B SURFACE ANTIBODY,QUALITATIVE: Hep B S Ab: NONREACTIVE

## 2015-01-06 LAB — GLUCOSE, CAPILLARY
GLUCOSE-CAPILLARY: 86 mg/dL (ref 65–99)
GLUCOSE-CAPILLARY: 81 mg/dL (ref 65–99)
Glucose-Capillary: 96 mg/dL (ref 65–99)
Glucose-Capillary: 94 mg/dL (ref 65–99)
Glucose-Capillary: 105 mg/dL — ABNORMAL HIGH (ref 65–99)

## 2015-01-06 LAB — PROTIME-INR
INR: 1.44 (ref 0.00–1.49)
Prothrombin Time: 17.6 seconds — ABNORMAL HIGH (ref 11.6–15.2)

## 2015-01-06 LAB — RENAL FUNCTION PANEL
ALBUMIN: 2.1 g/dL — AB (ref 3.5–5.0)
Anion gap: 11 (ref 5–15)
BUN: 115 mg/dL — AB (ref 6–20)
CO2: 23 mmol/L (ref 22–32)
CREATININE: 5.02 mg/dL — AB (ref 0.44–1.00)
Calcium: 8.5 mg/dL — ABNORMAL LOW (ref 8.9–10.3)
Chloride: 101 mmol/L (ref 101–111)
GFR calc Af Amer: 9 mL/min — ABNORMAL LOW (ref >60)
GFR calc non Af Amer: 7 mL/min — ABNORMAL LOW (ref >60)
GLUCOSE: 108 mg/dL — AB (ref 65–99)
PHOSPHORUS: 6.4 mg/dL — AB (ref 2.5–4.6)
POTASSIUM: 4.5 mmol/L (ref 3.5–5.1)
Sodium: 135 mmol/L (ref 135–145)

## 2015-01-06 LAB — HEPATITIS B CORE ANTIBODY, TOTAL: Hep B Core Total Ab: NEGATIVE

## 2015-01-06 MED ORDER — HEPARIN BOLUS VIA INFUSION
2000.0000 [IU] | Freq: Once | INTRAVENOUS | Status: AC
  Administered 2015-01-06: 2000 [IU] via INTRAVENOUS
  Filled 2015-01-06: qty 2000

## 2015-01-06 MED ORDER — MIDODRINE HCL 5 MG PO TABS
10.0000 mg | ORAL_TABLET | Freq: Three times a day (TID) | ORAL | Status: DC
  Administered 2015-01-06 – 2015-01-10 (×13): 10 mg via ORAL
  Filled 2015-01-06 (×13): qty 2

## 2015-01-06 MED ORDER — POLYETHYLENE GLYCOL 3350 17 G PO PACK
34.0000 g | PACK | Freq: Once | ORAL | Status: DC
  Filled 2015-01-06: qty 2

## 2015-01-06 MED ORDER — POLYETHYLENE GLYCOL 3350 17 G PO PACK
17.0000 g | PACK | Freq: Two times a day (BID) | ORAL | Status: AC
  Administered 2015-01-06 – 2015-01-07 (×3): 17 g via ORAL
  Filled 2015-01-06 (×2): qty 1

## 2015-01-06 MED ORDER — METOCLOPRAMIDE HCL 5 MG/ML IJ SOLN
5.0000 mg | Freq: Two times a day (BID) | INTRAMUSCULAR | Status: DC | PRN
  Administered 2015-01-06 – 2015-01-07 (×2): 5 mg via INTRAVENOUS
  Filled 2015-01-06 (×2): qty 2

## 2015-01-06 MED ORDER — SENNOSIDES-DOCUSATE SODIUM 8.6-50 MG PO TABS
1.0000 | ORAL_TABLET | Freq: Two times a day (BID) | ORAL | Status: DC
  Administered 2015-01-06 – 2015-01-10 (×7): 1 via ORAL
  Filled 2015-01-06 (×7): qty 1

## 2015-01-06 NOTE — Progress Notes (Signed)
Patient Demographics  Alison Gross, is a 81 y.o. female, DOB - 17-Jan-1933, HBZ:[id number]  Admit date - 01/01/2015   Admitting Physician Debbe Odea, MD  Outpatient Primary MD for the patient is Maximino Greenland, MD  LOS - 5   Chief Complaint  Patient presents with  . Shortness of Breath       Admission HPI/Brief narrative: 81 year old female with history of HTN, DM, chronic diastolic CHF, breast cancer status post bilateral mastectomies, PAF, chronic kidney disease stage 4(baseline creatinine~2), recurrent exudative right pleural effusion s/p right Pleurx catheter, right axillary/subclavian DVT, M spike presented to [facility] ED on 01/01/15 with worsening dyspnea, weight gain, weakness and worsening renal functions. She was seen by her cardiologist on 10/19 and Lasix was changed to [facility]. In the ED, creatinine up from 2.5-6.9, BNP 2364, hypotensive range blood pressures in the 80s-90s, elevated troponin and supratherapeutic INR 5.8. She was hospitalized for management of acute on chronic renal failure, acute on chronic diastolic CHF and hypotension. Advanced CHF team and nephrology are consulting. Patient had permacath inserted 10/28 , a shunt was started on hemodialysis 10/28.  Subjective:   Alison Gross today has, No headache, No chest pain, No abdominal pain -  reports nausea, had one episode of vomiting this a.m..  Assessment & Plan    Principal Problem:   Acute on chronic renal failure (HCC) Active Problems:   MGUS (monoclonal gammopathy of unknown significance)   Ductal carcinoma in situ (DCIS) of left breast   Acute on chronic diastolic heart failure (HCC)   Type 2 diabetes mellitus with renal manifestations (HCC)   Hypotension   Substernal goiter   PAF (paroxysmal atrial fibrillation) (HCC)   Diastolic CHF, acute on chronic (HCC)   Stage 4 chronic kidney disease due to  arterionephrosclerosis (HCC)   Pleural effusion exudative   Supratherapeutic INR   ARF (acute renal failure) (HCC)  Acute on stage IV chronic kidney disease/hyperkalemia - Baseline creatinine ~2, Admitted with creatinine of 6.9. - Most likely related to Cardiorenal syndrome and acute diastolic dysfunction, less likely a due to MGUS(increased lambda chain may be related to her renal failure not causing it as per hematology). - renal ultrasound-no hydronephrosis ,Diffusely echogenic kidneys, compatible with medical renal disease - Nephrology consult appreciated, diuresis managed by nephrology. - Hematology consult appreciated, patient went for bone marrow biopsy 10/27 to evaluate for possible myeloma., Results pending - Started hemodialysis on 10/28 after permacath insertion on 10/28  Acute on chronic diastolic CHF - Patient is significantly volume overloaded in the setting of worsening renal function. - Repeat echo remains was preserved LV function - At this state improvement of volume status after initiation of dialysis  Recurrent right pleural effusion, s/p Pleurx catheter - cytology from her Right effusion has been negative x4, being drained daily, 450 mL over last 24 hours.   Paroxysmal atrial fibrillation - Heart rate controlled - INR supratherapeutic initially, corrected with vitamin K for need of surgical procedures include permacath insertion and bone marrow biopsy. - Continue to hold warfarin in anticipation for AV fistula/graft next week, meanwhile will start patient on heparin gtt. -Continue amiodarone. Metoprolol on hold secondary to hypotension.   History of breast cancer status post bilateral mastectomies 2014 - Oncology input  appreciated. In remission.  MGUS - Oncology input appreciated. Do not believe that her breast cancer history  -Oncology consult appreciated, and had bone marrow biopsy is a day.  Iron deficiency anemia - on  Aranesp & IV Feraheme - follow  CBC's  Hypotension -  Holding blood pressure medications. Started on midodrine  Uncontrolled DM 2 with renal complications - Holding oral diabetics. Continue NovoLog SSI. Controlled.  Supratherapeutic INR - Admitted with INR of 5.8. Likely elevated from acute illness. Improved with vitamin K ,   Goiter - Abnormal TSH <0.010. May be related to amiodarone. free T4 is elevated at 3.48 , free T 3 wnl at 2.3, this picture is compatible with hyperthyroidism, discussed with Dr. Buddy Duty,  will follow with her as an outpatient(his office will call her with an appointment), will start on low-dose methimazole, may need to change amiodarone when patient is more stable as alternatives now giving her low blood pressure.  Nausea and vomiting - No acute finding in abdominal x-ray, possible constipation, will start on laxatives.  Code Status: Full  Family Communication: none at bedside  Disposition Plan: Remains in stepdown, pending further workup   Procedures  - Bone marrow biopsy by IR 10/27 - Patient has a right Pleurx catheter from prior to admission, which is drained daily. - Permacath insertion on 10/28  Consults   Cardiology CHF Nephrology Hematology Vascular surgery   Medications  Scheduled Meds: . amiodarone  200 mg Oral Daily  . anastrozole  1 mg Oral Daily  . darbepoetin (ARANESP) injection - NON-DIALYSIS  100 mcg Subcutaneous Q Tue-1800  . feeding supplement (ENSURE ENLIVE)  237 mL Oral BID BM  . ferumoxytol  510 mg Intravenous Weekly  . furosemide  160 mg Oral TID  . insulin aspart  0-9 Units Subcutaneous TID WC  . methimazole  20 mg Oral Daily  . midodrine  10 mg Oral TID WC  . multivitamin  1 tablet Oral QHS  . polyethylene glycol  34 g Oral Once  . sodium chloride  3 mL Intravenous Q12H  . thiamine  100 mg Oral Daily   Continuous Infusions: . heparin 1,000 Units/hr (01/06/15 0256)   PRN Meds:.sodium chloride, acetaminophen, albuterol, ondansetron (ZOFRAN) IV,  oxyCODONE-acetaminophen, sodium chloride  DVT Prophylaxis  on lovenox  Lab Results  Component Value Date   PLT 317 01/05/2015    Antibiotics    Anti-infectives    Start     Dose/Rate Route Frequency Ordered Stop   01/05/15 1230  [MAR Hold]  cefUROXime (ZINACEF) 1.5 g in dextrose 5 % 50 mL IVPB     (MAR Hold since 01/05/15 0625)   1.5 g 100 mL/hr over 30 Minutes Intravenous To [facility]Stay [facility] 01/04/15 1131 01/05/15 0830          Objective:   Filed Vitals:   01/06/15 0954 01/06/15 1024 01/06/15 1054 01/06/15 1124  BP: 85/40 82/41 84/43 83/39  Pulse: 66 67 67 68  Temp:      TempSrc:      Resp:      Height:      Weight:      SpO2:        Wt Readings from Last 3 Encounters:  01/06/15 57.7 kg (127 lb 3.3 oz)  12/28/14 60.328 kg (133 lb)  12/11/14 58.06 kg (128 lb)     Intake/Output Summary (Last 24 hours) at 01/06/15 1241 Last data filed at 01/06/15 1000  Gross per 24 hour  Intake  362.8 ml  Output   1000 ml  Net -637.2 ml     Physical Exam General exam: Elderly frail female patient sitting up comfortably in bed without distress.  Respiratory system: Reduced breath sounds in the bases with scattered occasional bibasal crackles. Rest of lung fields clear to auscultation. No increased work of breathing. Cardiovascular system: S1 & S2 heard, RRR. No JVD, murmurs, gallops, clicks or pedal edema.Telemetry: SB 50s-SR 60s  Gastrointestinal system: Abdomen is nondistended, soft and nontender. Normal bowel sounds heard. Central nervous system: Alert and oriented. No focal neurological deficits. Extremities: Symmetric 5 x 5 power.   Data Review   Micro Results Recent Results (from the past 240 hour(s))  MRSA PCR Screening     Status: None   Collection Time: 01/01/15  3:10 PM  Result Value Ref Range Status   MRSA by PCR NEGATIVE NEGATIVE Final    Comment:        The GeneXpert MRSA Assay (FDA approved for NASAL specimens only), is one component of  a comprehensive MRSA colonization surveillance program. It is not intended to diagnose MRSA infection nor to guide or monitor treatment for MRSA infections.   Urine culture     Status: None   Collection Time: 01/01/15  4:46 PM  Result Value Ref Range Status   Specimen Description URINE, CLEAN CATCH  Final   Special Requests NONE  Final   Culture MULTIPLE SPECIES PRESENT, SUGGEST RECOLLECTION  Final   Report Status 01/03/2015 FINAL  Final  Surgical pcr screen     Status: None   Collection Time: 01/05/15  5:47 AM  Result Value Ref Range Status   MRSA, PCR NEGATIVE NEGATIVE Final   Staphylococcus aureus NEGATIVE NEGATIVE Final    Comment:        The Xpert SA Assay (FDA approved for NASAL specimens in patients over 4 years of age), is one component of a comprehensive surveillance program.  Test performance has been validated by [facility] for patients greater than or equal to 4 year old. It is not intended to diagnose infection nor to guide or monitor treatment.     Radiology Reports Dg Chest 2 View  01/01/2015  CLINICAL DATA:  Shortness of Breath EXAM: CHEST  2 VIEW COMPARISON:  December 28, 2014 chest radiograph and CT of the neck and upper chest September 21, 2014 FINDINGS: There remain small pleural effusions bilaterally with bibasilar atelectasis. There is a drainage catheter on the left, unchanged in position. No pneumothorax. Heart is enlarged with pulmonary vascular within normal limits. The large right substernal mass, noted previously to represent marked thyroid enlargement, is again noted and stable. No adenopathy appreciable. IMPRESSION: The marked enlargement of the right lobe of the thyroid is again noted. There are small pleural effusions bilaterally with bibasilar atelectasis. In comparison with recent chest radiograph, there is slightly less atelectasis and effusion on the left and right sides. No new opacity. No change in cardiac silhouette. Electronically Signed    By: [name] III M.D.   On: 01/01/2015 12:48   Dg Chest 2 View  12/28/2014  CLINICAL DATA:  Pleural effusion. EXAM: CHEST  2 VIEW COMPARISON:  12/11/2014.  11/16/2014.  CT 09/21/2014 FINDINGS: Stable right paratracheal substernal mass consistent previously thyroid lesion. Stable cardiomegaly. No focal acute infiltrate. Scratch Stable small right pleural effusion. Stable small left pleural effusion. Right chest tube in stable position. No pneumothorax. Surgical clips noted over the right chest. IMPRESSION: 1. Right chest tube in stable position. Stable bilateral pleural  effusions. 2. Scratched stable large right paratracheal thyroid mass. Electronically Signed   By: Marcello Moores  Register   On: 12/28/2014 12:32   Dg Chest 2 View  12/11/2014  CLINICAL DATA:  Followup pleural effusions. Breast carcinoma. Goiter. EXAM: CHEST  2 VIEW COMPARISON:  11/16/2014 and previous CT on 09/19/2014 FINDINGS: Right chest tube remains in place. Small right pleural effusion shows mild decrease in size since previous study. No pneumothorax visualized. Small left pleural effusion remains stable. Heart size remains stable. Large right mediastinal mass is unchanged and consistent with substernal goiter shown on recent CT. IMPRESSION: Mild decrease in right pleural effusion. No pneumothorax visualized. Stable small left pleural effusion. Stable mediastinal mass, consistent with substernal goiter showed on recent CT. Electronically Signed   By: Earle Gell M.D.   On: 12/11/2014 14:38   Dg Chest Left Decubitus  01/03/2015  CLINICAL DATA:  Pleural effusion. EXAM: CHEST - LEFT DECUBITUS COMPARISON:  January 01, 2015. FINDINGS: Small free flowing left pleural effusion is noted. Stable large amount of right thyroid lobe. Pleural drainage catheter is noted on the right. IMPRESSION: Small free flowing left pleural effusion is noted. Electronically Signed   By: Marijo Conception, M.D.   On: 01/03/2015 15:28   US Renal  01/01/2015   CLINICAL DATA:  Acute renal failure.  Diabetes. EXAM: RENAL / URINARY TRACT ULTRASOUND COMPLETE COMPARISON:  None. FINDINGS: Right Kidney: Length: 9.1 cm.  Diffusely echogenic.  No hydronephrosis. Left Kidney: Length: 9.4 cm. Diffusely echogenic. No hydronephrosis. Several tiny cysts. Bladder: Appears normal for degree of bladder distention. A small amount of free peritoneal fluid is noted. IMPRESSION: 1. Diffusely echogenic kidneys, compatible with medical renal disease. 2. No hydronephrosis. 3. Small amount of ascites. Electronically Signed   By: Claudie Revering M.D.   On: 01/01/2015 16:25   Ct Biopsy  01/04/2015  CLINICAL DATA:  81 year old female with a history of monoclonal gammopathy. Presents for bone marrow biopsy EXAM: CT-GUIDED BIOPSY bone marrow MEDICATIONS AND MEDICAL HISTORY: Versed 0.5 mg, Fentanyl 25 mcg. Additional Medications: None. ANESTHESIA/SEDATION: Moderate sedation time: 15 minutes PROCEDURE: The procedure risks, benefits, and alternatives were explained to the patient. Questions regarding the procedure were encouraged and answered. The patient understands and consents to the procedure. Scout CT of the pelvis was performed for surgical planning purposes. The posterior pelvis was prepped with Betadinein a sterile fashion, and a sterile drape was applied covering the operative field. A sterile gown and sterile gloves were used for the procedure. Local anesthesia was provided with 1% Lidocaine. We targeted the right posterior iliac bone for biopsy. The skin and subcutaneous tissues were infiltrated with 1% lidocaine without epinephrine. A small stab incision was made with an 11 blade scalpel, and an 11 gauge Murphy needle was advanced with CT guidance to the posterior cortex. Manual forced was used to advance the needle through the posterior cortex and the stylet was removed. A bone marrow aspirate was retrieved and passed to a cytotechnologist in the room. The Murphy needle was then advanced  without the stylet for a core biopsy. The core biopsy was retrieved and also passed to a cytotechnologist. Manual pressure was used for hemostasis and a sterile dressing was placed. No complications were encountered no significant blood loss was encountered. Patient tolerated the procedure well and remained hemodynamically stable throughout. FINDINGS: Scout image demonstrates safe approach to posterior iliac bone. Images during the case demonstrate placement of 11 gauge Murphy needle COMPLICATIONS: None IMPRESSION: Status post CT-guided bone marrow biopsy,  with tissue specimen sent to pathology for complete histopathologic analysis Signed, Dulcy Fanny. Earleen Newport, DO [facility] Electronically Signed   By: Corrie Mckusick D.O.   On: 01/04/2015 10:35   Dg Chest Port 1 View  01/05/2015  CLINICAL DATA:  Status post dialysis catheter insertion. EXAM: PORTABLE CHEST 1 VIEW COMPARISON:  January 04, 2015. FINDINGS: Stable cardiomediastinal silhouette. Large right superior mediastinal mass is unchanged. Interval placement of right internal jugular dialysis catheter with distal tips in the expected position of the SVC. No pneumothorax is noted. Mild bilateral pleural effusions are noted. Bibasilar opacities are noted concerning for edema or atelectasis. Surgical clips are noted in the axillary regions bilaterally. Bony thorax is unremarkable. IMPRESSION: Interval placement of right internal jugular dialysis catheter with distal tips in the expected position of the SVC. No pneumothorax is noted. Stable large right superior mediastinal mass is noted. Mild bilateral pleural effusions are noted, with bibasilar opacities concerning for edema or atelectasis. Electronically Signed   By: Marijo Conception, M.D.   On: 01/05/2015 09:24   Dg Chest Port 1 View  01/04/2015  CLINICAL DATA:  Right pneumothorax. EXAM: PORTABLE CHEST 1 VIEW COMPARISON:  Left lateral decubitus chest  radiograph and bone survey 01/03/2015. Chest radiographs 01/01/2015. FINDINGS: Right superior mediastinal mass is again noted consistent with markedly enlarged thyroid. The cardiac silhouette remains mildly enlarged. Mild central pulmonary vascular congestion remains. A right-sided pleural catheter remains in place. There are persistent small bilateral pleural effusions, unchanged from 01/01/2015. Patchy bibasilar opacities also do not appear significantly changed. No pneumothorax is identified. IMPRESSION: 1. Unchanged small bilateral pleural effusions and bibasilar atelectasis. 2. No pneumothorax identified. Electronically Signed   By: Logan Bores M.D.   On: 01/04/2015 07:37   Dg Abd Portable 1v  01/06/2015  CLINICAL DATA:  Nausea/ vomiting, on dialysis EXAM: PORTABLE ABDOMEN - 1 VIEW COMPARISON:  None. FINDINGS: Nonobstructive bowel gas pattern. Moderate colonic stool burden. Degenerative changes of the lumbar spine. Right chest drain, incompletely visualized. IMPRESSION: No evidence of bowel obstruction. Moderate colonic stool burden, raising the possibility of constipation. Electronically Signed   By: Julian Hy M.D.   On: 01/06/2015 11:11   Dg Bone Survey Met  01/03/2015  ADDENDUM REPORT: 01/03/2015 13:51 ADDENDUM: Critical Value/emergent results were discussed with the patient's provider DAWOOD ELGERGAWY on 01/03/2015 at time 1:50 pm. Electronically Signed   By: Nolon Nations M.D.   On: 01/03/2015 13:51  01/03/2015  CLINICAL DATA:  History of MGUS with worsening renal function. Shortness of breath. Pain all over body. History of breast cancer. EXAM: METASTATIC BONE SURVEY COMPARISON:  07/27/2014 bone scan, PET-CT 09/18/2014, and metastatic survey 12/06/2012 FINDINGS: No lytic or blastic lesions are identified to suggest metastases or multiple myeloma deposits. Patient is a edentulous. Degenerative changes are seen in the cervical, thoracic, and lumbar spine. Significant disc height loss  and sclerosis identified at L3-4. Previous ORIF of right distal tibia and fibular fractures. Small probable bone island identified within the distal third of the left radius. The left radius was not imaged previously. Superior mediastinal mass consistent with enlarged thyroid as seen on prior studies. There has been development of lucency at the right lung base. Patient has a right pleural catheter. There is small right pleural effusion. Suspect interval development of right basilar pneumothorax. Small left pleural effusion is present. There is left lower lobe infiltrate, stable in appearance. Status post bilateral mastectomy and bilateral axillary node dissection. IMPRESSION: 1. Interval development  of right basilar pneumothorax. 2. Persistent small right pleural effusion. 3. Persistent left lower lobe infiltrate and pleural effusion. 4. Large right mediastinal mass stable and consistent with enlarged thyroid. 5. No suspicious lytic or blastic lesions. Critical Value/emergent results will be telephoned to the patient's provider DAWOOD [facility] at the time of interpretation on date 01/03/2015 at time 1:44 pm. Electronically Signed: By: Nolon Nations M.D. On: 01/03/2015 13:44     CBC  Recent Labs Lab 01/01/15 1125 01/03/15 0341 01/04/15 0215 01/05/15 0545  WBC 4.8 5.0 5.9 6.9  HGB 8.9* 8.4* 7.9* 8.2*  HCT 28.3* 26.3* 24.9* 25.3*  PLT 319 287 319 317  MCV 86.3 86.5 85.9 86.3  MCH 27.1 27.6 27.2 28.0  MCHC 31.4 31.9 31.7 32.4  RDW 17.3* 17.0* 16.7* 16.8*  LYMPHSABS  --   --  0.8  --   MONOABS  --   --  0.7  --   EOSABS  --   --  0.3  --   BASOSABS  --   --  0.0  --     Chemistries   Recent Labs Lab 01/01/15 1125 01/02/15 0327 01/03/15 0341 01/04/15 0215 01/05/15 0545 01/06/15 0156  NA 139 141 134* 135 136 135  K 5.9* 5.1 4.7 4.4 4.6 4.5  CL 110 110 104 104 102 101  CO2 20* 20* 21* _0 GLUCOSE 127* 112* 108* 106* 113* 108*  BUN 159* 159* 159* 156* 172* 115*  CREATININE  6.99* 6.58* 6.13* 6.01* 6.19* 5.02*  CALCIUM 8.9 8.8* 8.5* 8.8* 9.0 8.5*  AST 28  --   --   --   --   --   ALT 30  --   --   --   --   --   ALKPHOS 93  --   --   --   --   --   BILITOT 0.3  --   --   --   --   --    ------------------------------------------------------------------------------------------------------------------ estimated creatinine clearance is 8 mL/min (by C-G formula based on Cr of 5.02). ------------------------------------------------------------------------------------------------------------------ No results for input(s): HGBA1C in the last 72 hours. ------------------------------------------------------------------------------------------------------------------ No results for input(s): CHOL, HDL, LDLCALC, TRIG, CHOLHDL, LDLDIRECT in the last 72 hours. ------------------------------------------------------------------------------------------------------------------ No results for input(s): TSH, T4TOTAL, T3FREE, THYROIDAB in the last 72 hours.  Invalid input(s): FREET3 ------------------------------------------------------------------------------------------------------------------ No results for input(s): VITAMINB12, FOLATE, FERRITIN, TIBC, IRON, RETICCTPCT in the last 72 hours.  Coagulation profile  Recent Labs Lab 01/02/15 0327 01/03/15 0341 01/04/15 0215 01/05/15 0545 01/06/15 0156  INR 5.73* 5.15* 1.91* 1.31 1.44    No results for input(s): DDIMER in the last 72 hours.  Cardiac Enzymes No results for input(s): CKMB, TROPONINI, MYOGLOBIN in the last 168 hours.  Invalid input(s): CK ------------------------------------------------------------------------------------------------------------------ Invalid input(s): POCBNP     Time Spent in minutes   40 minutes   ELGERGAWY, DAWOOD M.D on 01/06/2015 at 12:41 PM  Between 7am to 7pm - Pager - [phone]  After 7pm go to [email or web address] - password [facility]  [facility]   Office   [phone]

## 2015-01-06 NOTE — Progress Notes (Signed)
ANTICOAGULATION CONSULT NOTE - Follow Up Consult  Pharmacy Consult for Heparin  Indication: atrial fibrillation  No Known Allergies  Patient Measurements: Height: '5\' 5"'$  (165.1 cm) Weight: 123 lb 14.4 oz (56.2 kg) IBW/kg (Calculated) : 57  Vital Signs: Temp: 97.8 F (36.6 C) (10/28 2344) Temp Source: Oral (10/28 2344) BP: 92/42 mmHg (10/28 2344) Pulse Rate: 67 (10/28 2344)  Labs:  Recent Labs  01/03/15 0341 01/04/15 0215 01/05/15 0545 01/06/15 0156  HGB 8.4* 7.9* 8.2*  --   HCT 26.3* 24.9* 25.3*  --   PLT 287 319 317  --   LABPROT 45.9* 21.8* 16.4* 17.6*  INR 5.15* 1.91* 1.31 1.44  HEPARINUNFRC  --   --   --  <0.10*  CREATININE 6.13* 6.01* 6.19* 5.02*    Estimated Creatinine Clearance: 7.9 mL/min (by C-G formula based on Cr of 5.02).   Assessment: Undetectable heparin level, no issues per RN.   Goal of Therapy:  Heparin level 0.3-0.7 units/ml Monitor platelets by anticoagulation protocol: Yes   Plan:  -Increase heparin drip to 1000 units/hr -1100 HL  Oran Dillenburg 01/06/2015,2:52 AM

## 2015-01-06 NOTE — Procedures (Signed)
Pt seen on HD  Ap 150  Vp 90  BFR 250.  Tolerated HD well.  Will increase midodrine to TID

## 2015-01-06 NOTE — Progress Notes (Signed)
S: Mild HA and nausea with HD yest.  Feels hungry but still with some nausea O:BP 87/43 mmHg  Pulse 64  Temp(Src) 98.4 F (36.9 C) (Oral)  Resp 15  Ht 5' 5"  (1.651 m)  Wt 57.6 kg (126 lb 15.8 oz)  BMI 21.13 kg/m2  SpO2 100%  Intake/Output Summary (Last 24 hours) at 01/06/15 0732 Last data filed at 01/06/15 0000  Gross per 24 hour  Intake 1287.2 ml  Output   1000 ml  Net  287.2 ml   Weight change: -0.2 kg (-7.1 oz) HYI:FOYDX and alert CVS:RRR 2/6 systolic M Resp:Decreased BS bases.  Rt pleurex tube Abd:+ BS NTND Ext: 2-3+ edema LE NEURO:CNI Ox3 no asterixis Rt IJ permcath   . amiodarone  200 mg Oral Daily  . anastrozole  1 mg Oral Daily  . darbepoetin (ARANESP) injection - NON-DIALYSIS  100 mcg Subcutaneous Q Tue-1800  . feeding supplement (ENSURE ENLIVE)  237 mL Oral BID BM  . ferumoxytol  510 mg Intravenous Weekly  . furosemide  160 mg Oral TID  . insulin aspart  0-9 Units Subcutaneous TID WC  . methimazole  20 mg Oral Daily  . midodrine  10 mg Oral BID WC  . multivitamin  1 tablet Oral QHS  . sodium chloride  3 mL Intravenous Q12H  . thiamine  100 mg Oral Daily   Ct Biopsy  01/04/2015  CLINICAL DATA:  81 year old female with a history of monoclonal gammopathy. Presents for bone marrow biopsy EXAM: CT-GUIDED BIOPSY bone marrow MEDICATIONS AND MEDICAL HISTORY: Versed 0.5 mg, Fentanyl 25 mcg. Additional Medications: None. ANESTHESIA/SEDATION: Moderate sedation time: 15 minutes PROCEDURE: The procedure risks, benefits, and alternatives were explained to the patient. Questions regarding the procedure were encouraged and answered. The patient understands and consents to the procedure. Scout CT of the pelvis was performed for surgical planning purposes. The posterior pelvis was prepped with Betadinein a sterile fashion, and a sterile drape was applied covering the operative field. A sterile gown and sterile gloves were used for the procedure. Local anesthesia was provided with  1% Lidocaine. We targeted the right posterior iliac bone for biopsy. The skin and subcutaneous tissues were infiltrated with 1% lidocaine without epinephrine. A small stab incision was made with an 11 blade scalpel, and an 11 gauge Murphy needle was advanced with CT guidance to the posterior cortex. Manual forced was used to advance the needle through the posterior cortex and the stylet was removed. A bone marrow aspirate was retrieved and passed to a cytotechnologist in the room. The Murphy needle was then advanced without the stylet for a core biopsy. The core biopsy was retrieved and also passed to a cytotechnologist. Manual pressure was used for hemostasis and a sterile dressing was placed. No complications were encountered no significant blood loss was encountered. Patient tolerated the procedure well and remained hemodynamically stable throughout. FINDINGS: Scout image demonstrates safe approach to posterior iliac bone. Images during the case demonstrate placement of 11 gauge Murphy needle COMPLICATIONS: None IMPRESSION: Status post CT-guided bone marrow biopsy, with tissue specimen sent to pathology for complete histopathologic analysis Signed, Dulcy Fanny. Earleen Newport, DO [facility] Electronically Signed   By: Corrie Mckusick D.O.   On: 01/04/2015 10:35   Dg Chest Port 1 View  01/05/2015  CLINICAL DATA:  Status post dialysis catheter insertion. EXAM: PORTABLE CHEST 1 VIEW COMPARISON:  January 04, 2015. FINDINGS: Stable cardiomediastinal silhouette. Large right superior mediastinal mass is unchanged. Interval placement  of right internal jugular dialysis catheter with distal tips in the expected position of the SVC. No pneumothorax is noted. Mild bilateral pleural effusions are noted. Bibasilar opacities are noted concerning for edema or atelectasis. Surgical clips are noted in the axillary regions bilaterally. Bony thorax is unremarkable. IMPRESSION:  Interval placement of right internal jugular dialysis catheter with distal tips in the expected position of the SVC. No pneumothorax is noted. Stable large right superior mediastinal mass is noted. Mild bilateral pleural effusions are noted, with bibasilar opacities concerning for edema or atelectasis. Electronically Signed   By: Marijo Conception, M.D.   On: 01/05/2015 09:24   BMET    Component Value Date/Time   NA 135 01/06/2015 0156   NA 143 09/18/2014 0946   K 4.5 01/06/2015 0156   K 4.0 09/18/2014 0946   CL 101 01/06/2015 0156   CL 107 08/05/2012 1009   CO2 23 01/06/2015 0156   CO2 30* 09/18/2014 0946   GLUCOSE 108* 01/06/2015 0156   GLUCOSE 94 09/18/2014 0946   GLUCOSE 128* 08/05/2012 1009   BUN 115* 01/06/2015 0156   BUN 31.2* 09/18/2014 0946   CREATININE 5.02* 01/06/2015 0156   CREATININE 1.7* 09/18/2014 0946   CALCIUM 8.5* 01/06/2015 0156   CALCIUM 11.8* 09/18/2014 0946   CALCIUM 10.7* 07/27/2014 0830   GFRNONAA 7* 01/06/2015 0156   GFRAA 9* 01/06/2015 0156   CBC    Component Value Date/Time   WBC 6.9 01/05/2015 0545   WBC 4.5 09/18/2014 0946   RBC 2.93* 01/05/2015 0545   RBC 3.34* 09/18/2014 0946   RBC 2.80* 01/24/2012 1011   HGB 8.2* 01/05/2015 0545   HGB 9.6* 09/18/2014 0946   HCT 25.3* 01/05/2015 0545   HCT 29.4* 09/18/2014 0946   PLT 317 01/05/2015 0545   PLT 319 09/18/2014 0946   MCV 86.3 01/05/2015 0545   MCV 87.9 09/18/2014 0946   MCH 28.0 01/05/2015 0545   MCH 28.7 09/18/2014 0946   MCHC 32.4 01/05/2015 0545   MCHC 32.7 09/18/2014 0946   RDW 16.8* 01/05/2015 0545   RDW 16.8* 09/18/2014 0946   LYMPHSABS 0.8 01/04/2015 0215   LYMPHSABS 0.6* 09/18/2014 0946   MONOABS 0.7 01/04/2015 0215   MONOABS 0.6 09/18/2014 0946   EOSABS 0.3 01/04/2015 0215   EOSABS 0.3 09/18/2014 0946   BASOSABS 0.0 01/04/2015 0215   BASOSABS 0.1 09/18/2014 0946     Assessment:  1. Acute on CKD 4  ? related to cardiorenal syndrome due to diastolic dysfunction vs myeloma  kidney.  BM Bx results pending 2. Hyperkalemia, improved 3. Hx M-spike thought to be MGUS, Bx pending 4. Anemia on aranesp and given IV iron 5. Sec HPTH though PTH only 169 so no Vit D for now  Plan: 1.  HD today  2. I would leave off coumadin as she needs AVF/AVG placement next week.  Can use lovenox or IV heparin as bridge Zeph Riebel T

## 2015-01-06 NOTE — Progress Notes (Signed)
ANTICOAGULATION CONSULT NOTE - Follow Up Consult  Pharmacy Consult for Heparin  Indication: atrial fibrillation  No Known Allergies  Patient Measurements: Height: '5\' 5"'$  (165.1 cm) Weight: 125 lb (56.7 kg) (standing scale) IBW/kg (Calculated) : 57  Vital Signs: Temp: 98.2 F (36.8 C) (10/29 2015) Temp Source: Oral (10/29 2015) BP: 96/41 mmHg (10/29 2200) Pulse Rate: 71 (10/29 2200)  Labs:  Recent Labs  01/04/15 0215 01/05/15 0545 01/06/15 0156 01/06/15 1121 01/06/15 2230  HGB 7.9* 8.2*  --   --   --   HCT 24.9* 25.3*  --   --   --   PLT 319 317  --   --   --   LABPROT 21.8* 16.4* 17.6*  --   --   INR 1.91* 1.31 1.44  --   --   HEPARINUNFRC  --   --  <0.10* <0.10* <0.10*  CREATININE 6.01* 6.19* 5.02*  --   --     Estimated Creatinine Clearance: 8 mL/min (by C-G formula based on Cr of 5.02).   Assessment: Undetectable heparin level despite multiple rate increases, no issues per RN.   Goal of Therapy:  Heparin level 0.3-0.7 units/ml Monitor platelets by anticoagulation protocol: Yes   Plan:  -Increase heparin drip to 1450 units/hr -0800 HL  Narda Bonds 01/06/2015,11:43 PM

## 2015-01-06 NOTE — Progress Notes (Signed)
ANTICOAGULATION CONSULT NOTE - Follow Up Consult  Pharmacy Consult for Heparin Indication: atrial fibrillation  No Known Allergies  Patient Measurements: Height: '5\' 5"'$  (165.1 cm) Weight: 125 lb (56.7 kg) (standing scale) IBW/kg (Calculated) : 57 Heparin Dosing Weight:  56.7 kg  Vital Signs: Temp: 97.8 F (36.6 C) (10/29 1256) Temp Source: Oral (10/29 1256) BP: 89/37 mmHg (10/29 1256) Pulse Rate: 65 (10/29 1256)  Labs:  Recent Labs  01/04/15 0215 01/05/15 0545 01/06/15 0156 01/06/15 1121  HGB 7.9* 8.2*  --   --   HCT 24.9* 25.3*  --   --   PLT 319 317  --   --   LABPROT 21.8* 16.4* 17.6*  --   INR 1.91* 1.31 1.44  --   HEPARINUNFRC  --   --  <0.10* <0.10*  CREATININE 6.01* 6.19* 5.02*  --     Estimated Creatinine Clearance: 8 mL/min (by C-G formula based on Cr of 5.02).  Assessment: Anticoagulation: Patientis on chronic anticoagulation for afib. Admit INR 5.88 on Coumadin '2mg'$  daily, dose held 10/24, vitamin k 5 mg po given 10/26 at noon, vitamin K 10 mg IV given 10/26 at 1604. Heparin level <0.1. No CBC down. INR up to 1.44. Continue to hold Coumadin for AVF/AVG placement next week  Goal of Therapy:  Heparin level 0.3-0.7 units/ml Monitor platelets by anticoagulation protocol: Yes   Plan:  Heparin 2000 unit IV bolus Increase heparin infusion to 1200 units/hr Repeat heparin level in 8 hrs.   Alison Gross, PharmD, BCPS Clinical Staff Pharmacist Pager [phone]  Alison Gross 01/06/2015,1:44 PM

## 2015-01-07 DIAGNOSIS — Z7189 Other specified counseling: Secondary | ICD-10-CM

## 2015-01-07 DIAGNOSIS — Z515 Encounter for palliative care: Secondary | ICD-10-CM

## 2015-01-07 LAB — GLUCOSE, CAPILLARY
GLUCOSE-CAPILLARY: 81 mg/dL (ref 65–99)
Glucose-Capillary: 110 mg/dL — ABNORMAL HIGH (ref 65–99)

## 2015-01-07 LAB — RENAL FUNCTION PANEL
ANION GAP: 11 (ref 5–15)
Albumin: 2.2 g/dL — ABNORMAL LOW (ref 3.5–5.0)
BUN: 73 mg/dL — ABNORMAL HIGH (ref 6–20)
CALCIUM: 8.4 mg/dL — AB (ref 8.9–10.3)
CO2: 23 mmol/L (ref 22–32)
Chloride: 102 mmol/L (ref 101–111)
Creatinine, Ser: 4.1 mg/dL — ABNORMAL HIGH (ref 0.44–1.00)
GFR calc non Af Amer: 9 mL/min — ABNORMAL LOW (ref >60)
GFR, EST AFRICAN AMERICAN: 11 mL/min — AB (ref >60)
GLUCOSE: 91 mg/dL (ref 65–99)
PHOSPHORUS: 6.3 mg/dL — AB (ref 2.5–4.6)
Potassium: 4.4 mmol/L (ref 3.5–5.1)
SODIUM: 136 mmol/L (ref 135–145)

## 2015-01-07 LAB — CBC
HEMATOCRIT: 24.7 % — AB (ref 36.0–46.0)
HEMOGLOBIN: 7.8 g/dL — AB (ref 12.0–15.0)
MCH: 27.6 pg (ref 26.0–34.0)
MCHC: 31.6 g/dL (ref 30.0–36.0)
MCV: 87.3 fL (ref 78.0–100.0)
Platelets: 190 10*3/uL (ref 150–400)
RBC: 2.83 MIL/uL — AB (ref 3.87–5.11)
RDW: 16.9 % — ABNORMAL HIGH (ref 11.5–15.5)
WBC: 6.9 10*3/uL (ref 4.0–10.5)

## 2015-01-07 LAB — HEPARIN LEVEL (UNFRACTIONATED): Heparin Unfractionated: <0.1 IU/mL — ABNORMAL LOW (ref 0.30–0.70)

## 2015-01-07 LAB — PROTIME-INR
INR: 1.37 (ref 0.00–1.49)
PROTHROMBIN TIME: 17 s — AB (ref 11.6–15.2)

## 2015-01-07 LAB — ABO/RH: ABO/RH(D): O POS

## 2015-01-07 MED ORDER — BISACODYL 10 MG RE SUPP
10.0000 mg | Freq: Once | RECTAL | Status: AC
  Administered 2015-01-07: 10 mg via RECTAL
  Filled 2015-01-07: qty 1

## 2015-01-07 MED ORDER — HEPARIN BOLUS VIA INFUSION
1000.0000 [IU] | Freq: Once | INTRAVENOUS | Status: AC
  Administered 2015-01-07: 1000 [IU] via INTRAVENOUS
  Filled 2015-01-07: qty 1000

## 2015-01-07 MED ORDER — NEPRO/CARBSTEADY PO LIQD
237.0000 mL | Freq: Three times a day (TID) | ORAL | Status: DC
  Administered 2015-01-07 – 2015-01-09 (×4): 237 mL via ORAL

## 2015-01-07 MED ORDER — POLYETHYLENE GLYCOL 3350 17 G PO PACK
34.0000 g | PACK | Freq: Once | ORAL | Status: AC
  Administered 2015-01-07: 34 g via ORAL
  Filled 2015-01-07: qty 2

## 2015-01-07 MED ORDER — HEPARIN BOLUS VIA INFUSION
2000.0000 [IU] | Freq: Once | INTRAVENOUS | Status: AC
  Administered 2015-01-07: 2000 [IU] via INTRAVENOUS
  Filled 2015-01-07: qty 2000

## 2015-01-07 NOTE — Progress Notes (Signed)
   Patient now on HD. Will continue to follow at a distance. BM biopsy results still pending.   Taiwo Fish,MD 6:57 AM

## 2015-01-07 NOTE — Progress Notes (Signed)
S:Still with poor appetite and mild nausea.  pulled 1L with HD O:BP 88/35 mmHg  Pulse 62  Temp(Src) 98 F (36.7 C) (Oral)  Resp 10  Ht '5\' 5"'$  (1.651 m)  Wt 57.289 kg (126 lb 4.8 oz)  BMI 21.02 kg/m2  SpO2 97%  Intake/Output Summary (Last 24 hours) at 01/07/15 0716 Last data filed at 01/07/15 0500  Gross per 24 hour  Intake  266.4 ml  Output   1665 ml  Net -1398.6 ml   Weight change: 0.6 kg (1 lb 5.2 oz) DEY:CXKGY and alert CVS:RRR 2/6 systolic M Resp:Decreased BS bases.  Rt pleurex tube Abd:+ BS NTND Ext: 2-3+ edema LE NEURO:CNI Ox3 no asterixis Rt IJ permcath   . amiodarone  200 mg Oral Daily  . anastrozole  1 mg Oral Daily  . darbepoetin (ARANESP) injection - NON-DIALYSIS  100 mcg Subcutaneous Q Tue-1800  . feeding supplement (ENSURE ENLIVE)  237 mL Oral BID BM  . ferumoxytol  510 mg Intravenous Weekly  . furosemide  160 mg Oral TID  . insulin aspart  0-9 Units Subcutaneous TID WC  . methimazole  20 mg Oral Daily  . midodrine  10 mg Oral TID WC  . multivitamin  1 tablet Oral QHS  . polyethylene glycol  17 g Oral BID  . polyethylene glycol  34 g Oral Once  . senna-docusate  1 tablet Oral BID  . sodium chloride  3 mL Intravenous Q12H  . thiamine  100 mg Oral Daily   Dg Chest Port 1 View  01/05/2015  CLINICAL DATA:  Status post dialysis catheter insertion. EXAM: PORTABLE CHEST 1 VIEW COMPARISON:  January 04, 2015. FINDINGS: Stable cardiomediastinal silhouette. Large right superior mediastinal mass is unchanged. Interval placement of right internal jugular dialysis catheter with distal tips in the expected position of the SVC. No pneumothorax is noted. Mild bilateral pleural effusions are noted. Bibasilar opacities are noted concerning for edema or atelectasis. Surgical clips are noted in the axillary regions bilaterally. Bony thorax is unremarkable. IMPRESSION: Interval placement of right internal jugular dialysis catheter with distal tips in the expected position of the  SVC. No pneumothorax is noted. Stable large right superior mediastinal mass is noted. Mild bilateral pleural effusions are noted, with bibasilar opacities concerning for edema or atelectasis. Electronically Signed   By: Marijo Conception, M.D.   On: 01/05/2015 09:24   Dg Abd Portable 1v  01/06/2015  CLINICAL DATA:  Nausea/ vomiting, on dialysis EXAM: PORTABLE ABDOMEN - 1 VIEW COMPARISON:  None. FINDINGS: Nonobstructive bowel gas pattern. Moderate colonic stool burden. Degenerative changes of the lumbar spine. Right chest drain, incompletely visualized. IMPRESSION: No evidence of bowel obstruction. Moderate colonic stool burden, raising the possibility of constipation. Electronically Signed   By: Julian Hy M.D.   On: 01/06/2015 11:11   BMET    Component Value Date/Time   NA 136 01/07/2015 0256   NA 143 09/18/2014 0946   K 4.4 01/07/2015 0256   K 4.0 09/18/2014 0946   CL 102 01/07/2015 0256   CL 107 08/05/2012 1009   CO2 23 01/07/2015 0256   CO2 30* 09/18/2014 0946   GLUCOSE 91 01/07/2015 0256   GLUCOSE 94 09/18/2014 0946   GLUCOSE 128* 08/05/2012 1009   BUN 73* 01/07/2015 0256   BUN 31.2* 09/18/2014 0946   CREATININE 4.10* 01/07/2015 0256   CREATININE 1.7* 09/18/2014 0946   CALCIUM 8.4* 01/07/2015 0256   CALCIUM 11.8* 09/18/2014 0946   CALCIUM 10.7* 07/27/2014 0830  GFRNONAA 9* 01/07/2015 0256   GFRAA 11* 01/07/2015 0256   CBC    Component Value Date/Time   WBC 6.9 01/07/2015 0256   WBC 4.5 09/18/2014 0946   RBC 2.83* 01/07/2015 0256   RBC 3.34* 09/18/2014 0946   RBC 2.80* 01/24/2012 1011   HGB 7.8* 01/07/2015 0256   HGB 9.6* 09/18/2014 0946   HCT 24.7* 01/07/2015 0256   HCT 29.4* 09/18/2014 0946   PLT 190 01/07/2015 0256   PLT 319 09/18/2014 0946   MCV 87.3 01/07/2015 0256   MCV 87.9 09/18/2014 0946   MCH 27.6 01/07/2015 0256   MCH 28.7 09/18/2014 0946   MCHC 31.6 01/07/2015 0256   MCHC 32.7 09/18/2014 0946   RDW 16.9* 01/07/2015 0256   RDW 16.8* 09/18/2014  0946   LYMPHSABS 0.8 01/04/2015 0215   LYMPHSABS 0.6* 09/18/2014 0946   MONOABS 0.7 01/04/2015 0215   MONOABS 0.6 09/18/2014 0946   EOSABS 0.3 01/04/2015 0215   EOSABS 0.3 09/18/2014 0946   BASOSABS 0.0 01/04/2015 0215   BASOSABS 0.1 09/18/2014 0946     Assessment:  1. Acute on CKD 4  ? related to cardiorenal syndrome due to diastolic dysfunction vs myeloma kidney.  BM Bx results pending 2. Hyperkalemia, improved 3. Hx M-spike thought to be MGUS, Bx pending 4. Anemia on aranesp and given IV iron 5. Sec HPTH though PTH only 169 so no Vit D for now 6. Chronic hypotension on midodrine  Plan: 1.  HD tomorrow 2. Get up out of bed 3. Working on outpt spot 4. DC lasix  Chandra Asher T

## 2015-01-07 NOTE — Progress Notes (Signed)
ANTICOAGULATION CONSULT NOTE - Follow Up Consult  Pharmacy Consult for Heparin Indication: atrial fibrillation  Anticoagulation: Patientis on chronic anticoagulation for afib. Admit INR 5.88 on Coumadin '2mg'$  daily, dose held 10/24, vitamin k 5 mg po given 10/26 at noon, vitamin K 10 mg IV given 10/26 at 1604.  Heparin level remains <0.1 tonight. (Running appropriately, no problems with infusion per nursing).CBC down to 7.8 with chronic anemia. INR 1.37. Continue to hold Coumadin for AVF/AVG placement next week.  Difficult situation as patient has had 5 undetectable levels with increasing heparin rates. Patient currently on 28 units/kg/hr. No bleeding issues noted per nursing, IV appears to be infusing appropriately, however nursing notes hands where sticks are being done very edematous which could be diluting levels. Unsuccessful x2 with foot sticks, HD catheter is hep locked. Will unfortunately need to rebolus and increase rate, if still low in the morning will need to reconsider therapy options.  Goal of Therapy:  Heparin level 0.3-0.7 units/ml Monitor platelets by anticoagulation protocol: Yes  Plan:  Bolus heparin 1000 units Increase IV heparin to 1750 units/hr Repeat heparin level in 8 hr. Check aptt, ATIII level with am labs  No Known Allergies  Patient Measurements: Height: '5\' 5"'$  (165.1 cm) Weight: 126 lb 4.8 oz (57.289 kg) IBW/kg (Calculated) : 57 Heparin Dosing Weight:  56.7 kg  Vital Signs: Temp: 97.4 F (36.3 C) (10/30 1600) Temp Source: Oral (10/30 1600) BP: 112/48 mmHg (10/30 2000) Pulse Rate: 78 (10/30 1957)  Labs:  Recent Labs  01/05/15 0545 01/06/15 0156  01/06/15 2230 01/07/15 0256 01/07/15 0818 01/07/15 1840  HGB 8.2*  --   --   --  7.8*  --   --   HCT 25.3*  --   --   --  24.7*  --   --   PLT 317  --   --   --  190  --   --   LABPROT 16.4* 17.6*  --   --  17.0*  --   --   INR 1.31 1.44  --   --  1.37  --   --   HEPARINUNFRC  --  <0.10*  < > <0.10*   --  <0.10* <0.10*  CREATININE 6.19* 5.02*  --   --  4.10*  --   --   < > = values in this interval not displayed.  Estimated Creatinine Clearance: 9.8 mL/min (by C-G formula based on Cr of 4.1).   Erin Hearing PharmD., BCPS Clinical Pharmacist Pager [phone] 01/07/2015 9:57 PM

## 2015-01-07 NOTE — Progress Notes (Signed)
ANTICOAGULATION CONSULT NOTE - Follow Up Consult  Pharmacy Consult for Heparin Indication: atrial fibrillation  No Known Allergies  Patient Measurements: Height: '5\' 5"'$  (165.1 cm) Weight: 126 lb 4.8 oz (57.289 kg) IBW/kg (Calculated) : 57 Heparin Dosing Weight:  56.7 kg  Vital Signs: Temp: 98.4 F (36.9 C) (10/30 0800) Temp Source: Oral (10/30 0800) BP: 86/42 mmHg (10/30 0800) Pulse Rate: 64 (10/30 0800)  Labs:  Recent Labs  01/05/15 0545  01/06/15 0156 01/06/15 1121 01/06/15 2230 01/07/15 0256 01/07/15 0818  HGB 8.2*  --   --   --   --  7.8*  --   HCT 25.3*  --   --   --   --  24.7*  --   PLT 317  --   --   --   --  190  --   LABPROT 16.4*  --  17.6*  --   --  17.0*  --   INR 1.31  --  1.44  --   --  1.37  --   HEPARINUNFRC  --   < > <0.10* <0.10* <0.10*  --  <0.10*  CREATININE 6.19*  --  5.02*  --   --  4.10*  --   < > = values in this interval not displayed.  Estimated Creatinine Clearance: 9.8 mL/min (by C-G formula based on Cr of 4.1).  Assessment:  Anticoagulation: Patientis on chronic anticoagulation for afib. Admit INR 5.88 on Coumadin '2mg'$  daily, dose held 10/24, vitamin k 5 mg po given 10/26 at noon, vitamin K 10 mg IV given 10/26 at 1604. Heparin level remains <0.1. (Running appropriately, no problems with infusion).CBC down to 7.8 with chronic anemia. INR 1.37. Continue to hold Coumadin for AVF/AVG placement next week.  Goal of Therapy:  Heparin level 0.3-0.7 units/ml Monitor platelets by anticoagulation protocol: Yes   Plan:  .Bolus heparin 2000 units Increase IV heparin to 1650 units/hr Repeat heparin level in 8 hr.   Carle Fenech S. Alford Highland, PharmD, BCPS Clinical Staff Pharmacist Pager [phone]  Eilene Ghazi Stillinger 01/07/2015,9:54 AM

## 2015-01-07 NOTE — Consult Note (Signed)
Consultation Note Date: 01/07/2015   Patient Name: Alison Gross  DOB: 07-25-1932  MRN: [id number]  Age / Sex: 81 y.o., female  PCP: Glendale Chard, MD Referring Physician: Albertine Patricia, MD  Reason for Consultation: Establishing goals of care  Clinical Assessment/Narrative: Alison Gross is an 81 year old female with past medical history of hypertension, diabetes, diastolic congestive heart failure, recurrent pleural effusion, breast cancer, CKD, DVT, MGUS, large goiter not amenable to surgical resection, atrial fibrillation, iron deficiency anemia who is admitted with volume overload likely secondary to kidney failure due to cardiorenal etiology. She is in the process of starting dialysis with a plan for eventual hemodialysis 3 days per week. Palliative care was consulted for goals of care discussion.  Contacts/Participants in Discussion: The patient, her husband, 2 daughters, and a granddaughter Tax adviser: The patient  Relationship to Patient self HCPOA: No   SUMMARY OF RECOMMENDATIONS - I met with Alison Gross and her family. We discussed her clinical course to this point in time and she reports that the physicians have been doing a good job explaining things to her. - She and her family are invested in continuing dialysis. We talked about the fact that this is and intervention to manage her kidney failure but does not change the fact that she still has advanced renal disease with high risk of complications and continued decline moving forward.  - We discussed what is most important to her, including her family, her faith, and being at home.  We discussed goal moving forward being to continue with medical therapies that are in line with helping her achieve this goal. We also talked about eliminating therapies that are not in line with this goal.  This included discussions of assessing the benefits she is  receiving from dialysis after a time limited trial, possibility of placing limitations on care she receives in the hospital, and discussion of her code status. - Her family is very receptive to the information, and they report needing time to process everything we discussed today. There is no changes to her plan of care at this time. - I provided the family with copies of " Hard choices for loving people" for their review. - I'll plan to follow-up with Alison Gross her family in the next day or 2  Code Status/Advance Care Planning: Full code    Code Status Orders        Start     Ordered   01/01/15 1549  Full code   Continuous     01/01/15 1548      Symptom Management:   Nausea: She reports that she feels better after taking Zofran. She was having significant nausea following her last dialysis. We discussed use of medications for symptomatic treatment of effects of dialysis. She also has Reglan for use on as-needed basis if Zofran is ineffective  Additional Recommendations (Limitations, Scope, Preferences):  None at this time  Psycho-social/Spiritual:  Support System: Strong through her family and church Desire for further Chaplaincy support: No Additional Recommendations: None at this time  Prognosis: Unable to determine due to acute illness. She does have end-stage renal disease and is starting dialysis. If she were to elect to his continued dialysis, her expected prognosis would be 1-2 weeks.  Discharge Planning: Home with Home Health is her goal   Chief Complaint/ Primary Diagnoses: Present on Admission:  . Type 2 diabetes mellitus with renal manifestations ([place]) . Substernal goiter . 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  Acute on chronic renal failure ([place]) . PAF (paroxysmal atrial fibrillation) ([place]) . MGUS (monoclonal gammopathy of unknown significance) . Hypotension . Pleural effusion exudative . Ductal carcinoma in situ (DCIS) of  left breast . Diastolic CHF, acute on chronic (H[facility]) . Acute on chronic diastolic heart failure ([place]) . Supratherapeutic INR  I have reviewed the medical record, interviewed the patient and family, and examined the patient. The following aspects are pertinent.  Past Medical History  Diagnosis Date  . Hypercholesterolemia     takes Crestor daily  . Thyroid disease     had iodine radiation  . Hypertension     takes Hyzaar daily  . Dysrhythmia     takes Carvedilol daily  . Pneumonia     history of;last time about 4-38yr ago  . GERD (gastroesophageal reflux disease)     takes Protonix daily  . Gastric ulcer   . History of blood transfusion   . History of colon polyps   . Anemia     takes iron pill daily  . Diabetes mellitus without complication ([facility]     takes Tradjenta daily  . History of radiation therapy 07/12/12-08/26/12    right breast/  . Paroxysmal atrial fibrillation ([facility]     PCP EKG 09/27/2013: A. Fibrillation.. EKG 09/29/2013: S. Tach.  . Breast cancer ([facility] 04/12/12    right-pos lymph node/left-DCIS  . Shortness of breath on exertion   . Bruit of left carotid artery   . Acute upper GI bleeding 01/24/2012  . MGUS (monoclonal gammopathy of unknown significance) 04/21/2013  . Osteoporosis 11/29/2013  . Recurrent right pleural effusion 07/26/2014  . Stage III chronic kidney disease 07/26/2014  . Goiter 07/27/2014  . Substernal goiter 12/23/2012  . Pleural effusion, right 12/23/2012  . Type 2 diabetes mellitus with renal manifestations ([facility] 07/26/2014  . Dysphonia 09/20/2014  . Vocal cord paralysis     Suspected due to massive substernal goiter  . CHF (congestive heart failure) ([place]    Social History   Social History  . Marital Status: Married    Spouse Name: JJeneen Rinks . Number of Children: 2  . Years of Education: N/A   Social History Main Topics  . Smoking status: Former Smoker -- 0.00 packs/day    Types: Cigarettes    Quit date: 07/26/1971  . Smokeless tobacco:  Never Used  . Alcohol Use: No  . Drug Use: No  . Sexual Activity: Not Currently    Birth Control/ Protection: Surgical     Comment: menarche age 14,1st live birth [birth year] P2, no HRT   Other Topics Concern  . None   Social History Narrative   Married.  Ambulates independently.     Family History  Problem Relation Age of Onset  . Brain cancer Mother   . Aneurysm Father   . Diabetes Sister   . Diabetes Brother   . Diabetes Sister   . Diabetes Brother   . Heart failure Sister    Scheduled Meds: . amiodarone  200 mg Oral Daily  . anastrozole  1 mg Oral Daily  . darbepoetin (ARANESP) injection - NON-DIALYSIS  100 mcg Subcutaneous Q Tue-1800  . feeding supplement (ENSURE ENLIVE)  237 mL Oral BID BM  . feeding supplement (NEPRO CARB STEADY)  237 mL Oral TID WC  . ferumoxytol  510 mg Intravenous Weekly  . insulin aspart  0-9 Units Subcutaneous TID WC  . methimazole  20 mg Oral Daily  . midodrine  10 mg Oral TID WC  .  multivitamin  1 tablet Oral QHS  . polyethylene glycol  17 g Oral BID  . senna-docusate  1 tablet Oral BID  . sodium chloride  3 mL Intravenous Q12H  . thiamine  100 mg Oral Daily   Continuous Infusions: . heparin 1,650 Units/hr (01/07/15 1309)   PRN Meds:.sodium chloride, acetaminophen, albuterol, metoCLOPramide (REGLAN) injection, ondansetron (ZOFRAN) IV, oxyCODONE-acetaminophen, sodium chloride Medications Prior to Admission:  Prior to Admission medications   Medication Sig Start Date End Date Taking? Authorizing Provider  acetaminophen (TYLENOL) 500 MG tablet Take 500 mg by mouth 2 (two) times daily as needed for moderate pain or headache.    Yes Historical Provider, MD  amiodarone (PACERONE) 200 MG tablet Take 1 tablet (200 mg total) by mouth daily. 11/04/14  Yes Adrian Prows, MD  anastrozole (ARIMIDEX) 1 MG tablet TAKE 1 TABLET (1 MG TOTAL) BY MOUTH DAILY. 10/17/14  Yes Laurie Panda, NP  Cholecalciferol (VITAMIN D) 2000 UNITS CAPS Take by mouth.   Yes Historical  Provider, MD  feeding supplement, ENSURE ENLIVE, (ENSURE ENLIVE) LIQD Take 237 mLs by mouth 2 (two) times daily between meals. 07/28/14  Yes Venetia Maxon Rama, MD  ferrous sulfate 325 (65 FE) MG tablet Take 1 tablet (325 mg total) by mouth 3 (three) times daily with meals. 08/25/14  Yes Janece Canterbury, MD  linagliptin (TRADJENTA) 5 MG TABS tablet Take 5 mg by mouth daily.   Yes Historical Provider, MD  metoprolol tartrate (LOPRESSOR) 25 MG tablet Take 0.5 tablets (12.5 mg total) by mouth 2 (two) times daily. 11/08/14  Yes Adrian Prows, MD  Multiple Vitamin (MULTIVITAMIN WITH MINERALS) TABS Take 1 tablet by mouth every other day.    Yes Historical Provider, MD  thiamine 100 MG tablet Take 1 tablet (100 mg total) by mouth daily. 11/04/14  Yes Adrian Prows, MD  torsemide (DEMADEX) 20 MG tablet Take 20 mg by mouth daily. 12/29/14  Yes Historical Provider, MD  warfarin (COUMADIN) 2 MG tablet Take 2 mg by mouth daily at 6 PM. OR AS DIRECTED 10/16/14  Yes Historical Provider, MD  furosemide (LASIX) 40 MG tablet Take 1 tablet (40 mg total) by mouth every morning. Patient not taking: Reported on 01/01/2015 11/08/14   Adrian Prows, MD  VENTOLIN HFA 108 (90 BASE) MCG/ACT inhaler Inhale 2 puffs into the lungs every 4 (four) hours as needed for wheezing or shortness of breath.  10/05/14   Historical Provider, MD   No Known Allergies CBC:    Component Value Date/Time   WBC 6.9 01/07/2015 0256   WBC 4.5 09/18/2014 0946   HGB 7.8* 01/07/2015 0256   HGB 9.6* 09/18/2014 0946   HCT 24.7* 01/07/2015 0256   HCT 29.4* 09/18/2014 0946   PLT 190 01/07/2015 0256   PLT 319 09/18/2014 0946   MCV 87.3 01/07/2015 0256   MCV 87.9 09/18/2014 0946   NEUTROABS 4.1 01/04/2015 0215   NEUTROABS 2.9 09/18/2014 0946   LYMPHSABS 0.8 01/04/2015 0215   LYMPHSABS 0.6* 09/18/2014 0946   MONOABS 0.7 01/04/2015 0215   MONOABS 0.6 09/18/2014 0946   EOSABS 0.3 01/04/2015 0215   EOSABS 0.3 09/18/2014 0946   BASOSABS 0.0 01/04/2015 0215    BASOSABS 0.1 09/18/2014 0946   Comprehensive Metabolic Panel:    Component Value Date/Time   NA 136 01/07/2015 0256   NA 143 09/18/2014 0946   K 4.4 01/07/2015 0256   K 4.0 09/18/2014 0946   CL 102 01/07/2015 0256   CL 107 08/05/2012 1009  CO2 23 01/07/2015 0256   CO2 30* 09/18/2014 0946   BUN 73* 01/07/2015 0256   BUN 31.2* 09/18/2014 0946   CREATININE 4.10* 01/07/2015 0256   CREATININE 1.7* 09/18/2014 0946   GLUCOSE 91 01/07/2015 0256   GLUCOSE 94 09/18/2014 0946   GLUCOSE 128* 08/05/2012 1009   CALCIUM 8.4* 01/07/2015 0256   CALCIUM 11.8* 09/18/2014 0946   CALCIUM 10.7* 07/27/2014 0830   AST 28 01/01/2015 1125   AST 27 09/18/2014 0946   ALT 30 01/01/2015 1125   ALT 18 09/18/2014 0946   ALKPHOS 93 01/01/2015 1125   ALKPHOS 78 09/18/2014 0946   BILITOT 0.3 01/01/2015 1125   BILITOT 0.68 09/18/2014 0946   PROT 5.5* 01/01/2015 1125   PROT 6.8 09/18/2014 0946   ALBUMIN 2.2* 01/07/2015 0256   ALBUMIN 3.3* 09/18/2014 0946    Review of Systems  Constitutional: Positive for activity change, appetite change, fatigue and unexpected weight change. Negative for fever and chills.  Respiratory: Negative for shortness of breath and wheezing.   Cardiovascular: Negative for chest pain.  Gastrointestinal: Negative for abdominal pain and abdominal distention.  Psychiatric/Behavioral: Negative for confusion and decreased concentration.    Physical Exam  Constitutional: She is oriented to person, place, and time. No distress.  Frail-appearing, cachectic female sitting in chair  HENT:  Head: Normocephalic and atraumatic.  Eyes: Right eye exhibits no discharge. Left eye exhibits no discharge. No scleral icterus.  Neck: Normal range of motion. Neck supple.  Cardiovascular: Normal rate.   Murmur heard. Respiratory: No respiratory distress. She has no wheezes.  Diminished on right, decreased in bases bilaterally  GI: Soft. Bowel sounds are normal. There is no tenderness.   Musculoskeletal: She exhibits edema.  Neurological: She is alert and oriented to person, place, and time. No cranial nerve deficit.  Skin: Skin is warm and dry. She is not diaphoretic.  Psychiatric: She has a normal mood and affect. Her behavior is normal. Judgment and thought content normal.    Vital Signs: BP 102/41 mmHg  Pulse 64  Temp(Src) 97.6 F (36.4 C) (Oral)  Resp 12  Ht _0  (1.651 m)  Wt 57.289 kg (126 lb 4.8 oz)  BMI 21.02 kg/m2  SpO2 100% SpO2: Last BM Date: 01/01/15  O2 Device:SpO2: 100 % O2 Flow Rate: .O2 Flow Rate (L/min): 2 L/min Intake/output summary:  Intake/Output Summary (Last 24 hours) at 01/07/15 1542 Last data filed at 01/07/15 1350  Gross per 24 hour  Intake 187.93 ml  Output    550 ml  Net -362.07 ml   LBM:  BMP Latest Ref Rng 01/07/2015 01/06/2015 01/05/2015  Glucose 65 - 99 mg/dL 91 108(H) 113(H)  BUN 6 - 20 mg/dL 73(H) 115(H) 172(H)  Creatinine 0.44 - 1.00 mg/dL 4.10(H) 5.02(H) 6.19(H)  Sodium 135 - 145 mmol/L 136 135 136  Potassium 3.5 - 5.1 mmol/L 4.4 4.5 4.6  Chloride 101 - 111 mmol/L 102 101 102  CO2 22 - 32 mmol/L _1 Calcium 8.9 - 10.3 mg/dL 8.4(L) 8.5(L) 9.0    Baseline Weight: Weight: 59.875 kg (132 lb) Most recent weight: Weight: 57.289 kg (126 lb 4.8 oz)      Palliative Assessment/Data:  Flowsheet Rows        Most Recent Value   Intake Tab    Referral Department  Hospitalist   Unit at Time of Referral  Intermediate Care Unit   Palliative Care Primary Diagnosis  Nephrology   Date Notified  01/04/15   Palliative Care  Type  New Palliative care   Reason for referral  Clarify Goals of Care, Advance Care Planning   Date of Admission  01/01/15   Date first seen by Palliative Care  01/04/15   # of days Palliative referral response time  0 Day(s)   # of days IP prior to Palliative referral  3   Clinical Assessment    Palliative Performance Scale Score  50%   Psychosocial & Spiritual Assessment    Palliative Care Outcomes     Patient/Family meeting held?  Yes   Who was at the meeting?  Patient, husband, 2 daughters, and granddaughter   Palliative Care Outcomes  Counseled regarding hospice, Provided psychosocial or spiritual support, ACP counseling assistance      Additional Data Reviewed: Recent Labs     01/05/15  0545  01/06/15  0156  01/07/15  0256  WBC  6.9   --   6.9  HGB  8.2*   --   7.8*  PLT  317   --   190  NA  136  135  136  BUN  172*  115*  73*  CREATININE  6.19*  5.02*  4.10*    Time In: 1120 Time Out: 1245 Time Total: 85 Greater than 50%  of this time was spent counseling and coordinating care related to the above assessment and plan.  Signed by: Micheline Rough, MD  Micheline Rough, MD  01/07/2015, 3:42 PM  Please contact Palliative Medicine Team phone at [phone] for questions and concerns.

## 2015-01-07 NOTE — Progress Notes (Signed)
Patient Demographics  Alison Gross, is a 81 y.o. female, DOB - June 22, 1932, WUJ:[id number]  Admit date - 01/01/2015   Admitting Physician Debbe Odea, MD  Outpatient Primary MD for the patient is Maximino Greenland, MD  LOS - 6   Chief Complaint  Patient presents with  . Shortness of Breath       Admission HPI/Brief narrative: 81 year old female with history of HTN, DM, chronic diastolic CHF, breast cancer status post bilateral mastectomies, PAF, chronic kidney disease stage 4(baseline creatinine~2), recurrent exudative right pleural effusion s/p right Pleurx catheter, right axillary/subclavian DVT, M spike presented to [facility] ED on 01/01/15 with worsening dyspnea, weight gain, weakness and worsening renal functions. She was seen by her cardiologist on 10/19 and Lasix was changed to [facility]. In the ED, creatinine up from 2.5-6.9, BNP 2364, hypotensive range blood pressures in the 80s-90s, elevated troponin and supratherapeutic INR 5.8. She was hospitalized for management of acute on chronic renal failure, acute on chronic diastolic CHF and hypotension. Advanced CHF team and nephrology are consulting. INR supratherapeutic on admission, dictated with vitamin K for permacath and bone marrow biopsy,  Patient had permacath inserted 10/28 , and was started on hemodialysis 10/28, patient is on heparin drip pending permanent access insertion of vascular surgery this coming Wednesday. Subjective:   Laural Eiland today has, No headache, No chest pain, No abdominal pain -  reports nausea significantly improved, had one episode of small amount vomiting this a.m..  Assessment & Plan    Principal Problem:   Acute on chronic renal failure (HCC) Active Problems:   MGUS (monoclonal gammopathy of unknown significance)   Ductal carcinoma in situ (DCIS) of left breast   Acute on chronic diastolic heart failure (HCC)   Type 2  diabetes mellitus with renal manifestations (HCC)   Hypotension   Substernal goiter   PAF (paroxysmal atrial fibrillation) (HCC)   Diastolic CHF, acute on chronic (HCC)   Stage 4 chronic kidney disease due to arterionephrosclerosis (HCC)   Pleural effusion exudative   Supratherapeutic INR   ARF (acute renal failure) (HCC)  Acute on stage IV chronic kidney disease/hyperkalemia - Baseline creatinine ~2, Admitted with creatinine of 6.9. - Most likely related to Cardiorenal syndrome and acute diastolic dysfunction, less likely a due to MGUS(increased lambda chain may be related to her renal failure not causing it as per hematology). - renal ultrasound-no hydronephrosis ,Diffusely echogenic kidneys, compatible with medical renal disease - Nephrology consult appreciated, diuresis managed by nephrology. - Hematology consult appreciated, patient went for bone marrow biopsy 10/27 to evaluate for possible myeloma., Results pending - Started hemodialysis on 10/28 after permacath insertion on 10/28, acetaminophen doses on 28 and 29 back-to-back.  Acute on chronic diastolic CHF - Patient is significantly volume overloaded on admission in the setting of worsening renal function. - Repeat echo remains was preserved LV function - Patient to be improving with dialysis  Recurrent right pleural effusion, s/p Pleurx catheter - cytology from her Right effusion has been negative x4, being drained daily, 450 mL over last 24 hours.   Paroxysmal atrial fibrillation - Heart rate controlled - INR supratherapeutic initially, corrected with vitamin K for need of surgical procedures include permacath insertion and bone marrow biopsy. - Continue to hold warfarin  in anticipation for AV fistula/graft next week, currently patient on heparin gtt. -Continue amiodarone. Metoprolol on hold secondary to hypotension.   History of breast cancer status post bilateral mastectomies 2014 - Oncology input appreciated. In  remission.  MGUS - Oncology input appreciated. Do not believe that her breast cancer history  -Oncology consult appreciated, and had bone marrow biopsy on 10/27, results still pending  Iron deficiency anemia - on  Aranesp & IV Feraheme - follow CBC's, may need transfusion in a.m. during dialysis.  Hypotension -  Holding blood pressure medications. Started on midodrine  Uncontrolled DM 2 with renal complications - Holding oral diabetics. Continue NovoLog SSI. Controlled.  Supratherapeutic INR - Admitted with INR of 5.8. Likely elevated from acute illness. Improved with vitamin K ,   Goiter - Abnormal TSH <0.010. May be related to amiodarone. free T4 is elevated at 3.48 , free T 3 wnl at 2.3, this picture is compatible with hyperthyroidism, discussed with Dr. Buddy Duty,  will follow with her as an outpatient(his office will call her with an appointment), will start on low-dose methimazole, may need to change amiodarone when patient is more stable as alternatives now giving her low blood pressure.  Nausea and vomiting - Improving, no acute finding on chest x-ray  Severe protein calorie malnutrition - Started on Nepro  Code Status: Full  Family Communication: Daughter  Disposition Plan: Remains in stepdown, pending further workup   Procedures  - Bone marrow biopsy by IR 10/27 - Patient has a right Pleurx catheter from prior to admission, which is drained daily. - Permacath insertion on 10/28  [place]   Cardiology CHF Nephrology Hematology Vascular surgery   Medications  Scheduled Meds: . amiodarone  200 mg Oral Daily  . anastrozole  1 mg Oral Daily  . darbepoetin (ARANESP) injection - NON-DIALYSIS  100 mcg Subcutaneous Q Tue-1800  . feeding supplement (ENSURE ENLIVE)  237 mL Oral BID BM  . feeding supplement (NEPRO CARB STEADY)  237 mL Oral TID WC  . ferumoxytol  510 mg Intravenous Weekly  . insulin aspart  0-9 Units Subcutaneous TID WC  . methimazole  20 mg Oral  Daily  . midodrine  10 mg Oral TID WC  . multivitamin  1 tablet Oral QHS  . polyethylene glycol  17 g Oral BID  . senna-docusate  1 tablet Oral BID  . sodium chloride  3 mL Intravenous Q12H  . thiamine  100 mg Oral Daily   Continuous Infusions: . heparin 1,650 Units/hr (01/07/15 1005)   PRN Meds:.sodium chloride, acetaminophen, albuterol, metoCLOPramide (REGLAN) injection, ondansetron (ZOFRAN) IV, oxyCODONE-acetaminophen, sodium chloride  DVT Prophylaxis  on lovenox  Lab Results  Component Value Date   PLT 190 01/07/2015    Antibiotics    Anti-infectives    Start     Dose/Rate Route Frequency Ordered Stop   01/05/15 1230  [MAR Hold]  cefUROXime (ZINACEF) 1.5 g in dextrose 5 % 50 mL IVPB     (MAR Hold since 01/05/15 0625)   1.5 g 100 mL/hr over 30 Minutes Intravenous To ShortStay Surgical 01/04/15 1131 01/05/15 0830          Objective:   Filed Vitals:   01/07/15 0500 01/07/15 0600 01/07/15 0700 01/07/15 0800  BP: 88/35 88/35 94/40  86/42  Pulse: 64 62 63 64  Temp:    98.4 F (36.9 C)  TempSrc:    Oral  Resp: 15 10 11 12   Height:      Weight: 57.289 kg (126 lb  4.8 oz)     SpO2: 98% 97% 98% 99%    Wt Readings from Last 3 Encounters:  01/07/15 57.289 kg (126 lb 4.8 oz)  12/28/14 60.328 kg (133 lb)  12/11/14 58.06 kg (128 lb)     Intake/Output Summary (Last 24 hours) at 01/07/15 1100 Last data filed at 01/07/15 0500  Gross per 24 hour  Intake  236.4 ml  Output   1665 ml  Net -1428.6 ml     Physical Exam General exam: Elderly frail thin-appearing female patient sitting up comfortably in bed without distress.  Respiratory system: Reduced breath sounds in the bases with scattered occasional bibasal crackles. Rest of lung fields clear to auscultation. No increased work of breathing. Cardiovascular system: S1 & S2 heard, RRR. No JVD, murmurs, gallops, clicks or pedal edema Gastrointestinal system: Abdomen is nondistended, soft and nontender. Normal bowel  sounds heard. Central nervous system: Alert and oriented. No focal neurological deficits. Extremities: Symmetric 5 x 5 power.   Data Review   Micro Results Recent Results (from the past 240 hour(s))  MRSA PCR Screening     Status: None   Collection Time: 01/01/15  3:10 PM  Result Value Ref Range Status   MRSA by PCR NEGATIVE NEGATIVE Final    Comment:        The GeneXpert MRSA Assay (FDA approved for NASAL specimens only), is one component of a comprehensive MRSA colonization surveillance program. It is not intended to diagnose MRSA infection nor to guide or monitor treatment for MRSA infections.   Urine culture     Status: None   Collection Time: 01/01/15  4:46 PM  Result Value Ref Range Status   Specimen Description URINE, CLEAN CATCH  Final   Special Requests NONE  Final   Culture MULTIPLE SPECIES PRESENT, SUGGEST RECOLLECTION  Final   Report Status 01/03/2015 FINAL  Final  Surgical pcr screen     Status: None   Collection Time: 01/05/15  5:47 AM  Result Value Ref Range Status   MRSA, PCR NEGATIVE NEGATIVE Final   Staphylococcus aureus NEGATIVE NEGATIVE Final    Comment:        The Xpert SA Assay (FDA approved for NASAL specimens in patients over 54 years of age), is one component of a comprehensive surveillance program.  Test performance has been validated by [facility] for patients greater than or equal to 62 year old. It is not intended to diagnose infection nor to guide or monitor treatment.     Radiology Reports Dg Chest 2 View  01/01/2015  CLINICAL DATA:  Shortness of Breath EXAM: CHEST  2 VIEW COMPARISON:  December 28, 2014 chest radiograph and CT of the neck and upper chest September 21, 2014 FINDINGS: There remain small pleural effusions bilaterally with bibasilar atelectasis. There is a drainage catheter on the left, unchanged in position. No pneumothorax. Heart is enlarged with pulmonary vascular within normal limits. The large right substernal mass,  noted previously to represent marked thyroid enlargement, is again noted and stable. No adenopathy appreciable. IMPRESSION: The marked enlargement of the right lobe of the thyroid is again noted. There are small pleural effusions bilaterally with bibasilar atelectasis. In comparison with recent chest radiograph, there is slightly less atelectasis and effusion on the left and right sides. No new opacity. No change in cardiac silhouette. Electronically Signed   By: [name] III M.D.   On: 01/01/2015 12:48   Dg Chest 2 View  12/28/2014  CLINICAL DATA:  Pleural effusion.  EXAM: CHEST  2 VIEW COMPARISON:  12/11/2014.  11/16/2014.  CT 09/21/2014 FINDINGS: Stable right paratracheal substernal mass consistent previously thyroid lesion. Stable cardiomegaly. No focal acute infiltrate. Scratch Stable small right pleural effusion. Stable small left pleural effusion. Right chest tube in stable position. No pneumothorax. Surgical clips noted over the right chest. IMPRESSION: 1. Right chest tube in stable position. Stable bilateral pleural effusions. 2. Scratched stable large right paratracheal thyroid mass. Electronically Signed   By: Marcello Moores  Register   On: 12/28/2014 12:32   Dg Chest 2 View  12/11/2014  CLINICAL DATA:  Followup pleural effusions. Breast carcinoma. Goiter. EXAM: CHEST  2 VIEW COMPARISON:  11/16/2014 and previous CT on 09/19/2014 FINDINGS: Right chest tube remains in place. Small right pleural effusion shows mild decrease in size since previous study. No pneumothorax visualized. Small left pleural effusion remains stable. Heart size remains stable. Large right mediastinal mass is unchanged and consistent with substernal goiter shown on recent CT. IMPRESSION: Mild decrease in right pleural effusion. No pneumothorax visualized. Stable small left pleural effusion. Stable mediastinal mass, consistent with substernal goiter showed on recent CT. Electronically Signed   By: Earle Gell M.D.   On:  12/11/2014 14:38   Dg Chest Left Decubitus  01/03/2015  CLINICAL DATA:  Pleural effusion. EXAM: CHEST - LEFT DECUBITUS COMPARISON:  January 01, 2015. FINDINGS: Small free flowing left pleural effusion is noted. Stable large amount of right thyroid lobe. Pleural drainage catheter is noted on the right. IMPRESSION: Small free flowing left pleural effusion is noted. Electronically Signed   By: Marijo Conception, M.D.   On: 01/03/2015 15:28   US Renal  01/01/2015  CLINICAL DATA:  Acute renal failure.  Diabetes. EXAM: RENAL / URINARY TRACT ULTRASOUND COMPLETE COMPARISON:  None. FINDINGS: Right Kidney: Length: 9.1 cm.  Diffusely echogenic.  No hydronephrosis. Left Kidney: Length: 9.4 cm. Diffusely echogenic. No hydronephrosis. Several tiny cysts. Bladder: Appears normal for degree of bladder distention. A small amount of free peritoneal fluid is noted. IMPRESSION: 1. Diffusely echogenic kidneys, compatible with medical renal disease. 2. No hydronephrosis. 3. Small amount of ascites. Electronically Signed   By: Claudie Revering M.D.   On: 01/01/2015 16:25   Ct Biopsy  01/04/2015  CLINICAL DATA:  81 year old female with a history of monoclonal gammopathy. Presents for bone marrow biopsy EXAM: CT-GUIDED BIOPSY bone marrow MEDICATIONS AND MEDICAL HISTORY: Versed 0.5 mg, Fentanyl 25 mcg. Additional Medications: None. ANESTHESIA/SEDATION: Moderate sedation time: 15 minutes PROCEDURE: The procedure risks, benefits, and alternatives were explained to the patient. Questions regarding the procedure were encouraged and answered. The patient understands and consents to the procedure. Scout CT of the pelvis was performed for surgical planning purposes. The posterior pelvis was prepped with Betadinein a sterile fashion, and a sterile drape was applied covering the operative field. A sterile gown and sterile gloves were used for the procedure. Local anesthesia was provided with 1% Lidocaine. We targeted the right posterior iliac  bone for biopsy. The skin and subcutaneous tissues were infiltrated with 1% lidocaine without epinephrine. A small stab incision was made with an 11 blade scalpel, and an 11 gauge Murphy needle was advanced with CT guidance to the posterior cortex. Manual forced was used to advance the needle through the posterior cortex and the stylet was removed. A bone marrow aspirate was retrieved and passed to a cytotechnologist in the room. The Murphy needle was then advanced without the stylet for a core biopsy. The core biopsy was retrieved and also  passed to a cytotechnologist. Manual pressure was used for hemostasis and a sterile dressing was placed. No complications were encountered no significant blood loss was encountered. Patient tolerated the procedure well and remained hemodynamically stable throughout. FINDINGS: Scout image demonstrates safe approach to posterior iliac bone. Images during the case demonstrate placement of 11 gauge Murphy needle COMPLICATIONS: None IMPRESSION: Status post CT-guided bone marrow biopsy, with tissue specimen sent to pathology for complete histopathologic analysis Signed, Dulcy Fanny. Earleen Newport, DO [facility] Radiology Electronically Signed   By: Corrie Mckusick D.O.   On: 01/04/2015 10:35   Dg Chest Port 1 View  01/05/2015  CLINICAL DATA:  Status post dialysis catheter insertion. EXAM: PORTABLE CHEST 1 VIEW COMPARISON:  January 04, 2015. FINDINGS: Stable cardiomediastinal silhouette. Large right superior mediastinal mass is unchanged. Interval placement of right internal jugular dialysis catheter with distal tips in the expected position of the SVC. No pneumothorax is noted. Mild bilateral pleural effusions are noted. Bibasilar opacities are noted concerning for edema or atelectasis. Surgical clips are noted in the axillary regions bilaterally. Bony thorax is unremarkable. IMPRESSION: Interval placement of right internal jugular dialysis  catheter with distal tips in the expected position of the SVC. No pneumothorax is noted. Stable large right superior mediastinal mass is noted. Mild bilateral pleural effusions are noted, with bibasilar opacities concerning for edema or atelectasis. Electronically Signed   By: Marijo Conception, M.D.   On: 01/05/2015 09:24   Dg Chest Port 1 View  01/04/2015  CLINICAL DATA:  Right pneumothorax. EXAM: PORTABLE CHEST 1 VIEW COMPARISON:  Left lateral decubitus chest radiograph and bone survey 01/03/2015. Chest radiographs 01/01/2015. FINDINGS: Right superior mediastinal mass is again noted consistent with markedly enlarged thyroid. The cardiac silhouette remains mildly enlarged. Mild central pulmonary vascular congestion remains. A right-sided pleural catheter remains in place. There are persistent small bilateral pleural effusions, unchanged from 01/01/2015. Patchy bibasilar opacities also do not appear significantly changed. No pneumothorax is identified. IMPRESSION: 1. Unchanged small bilateral pleural effusions and bibasilar atelectasis. 2. No pneumothorax identified. Electronically Signed   By: Logan Bores M.D.   On: 01/04/2015 07:37   Dg Abd Portable 1v  01/06/2015  CLINICAL DATA:  Nausea/ vomiting, on dialysis EXAM: PORTABLE ABDOMEN - 1 VIEW COMPARISON:  None. FINDINGS: Nonobstructive bowel gas pattern. Moderate colonic stool burden. Degenerative changes of the lumbar spine. Right chest drain, incompletely visualized. IMPRESSION: No evidence of bowel obstruction. Moderate colonic stool burden, raising the possibility of constipation. Electronically Signed   By: Julian Hy M.D.   On: 01/06/2015 11:11   Dg Bone Survey Met  01/03/2015  ADDENDUM REPORT: 01/03/2015 13:51 ADDENDUM: Critical Value/emergent results were discussed with the patient's provider Jyden Kromer on 01/03/2015 at time 1:50 pm. Electronically Signed   By: Nolon Nations M.D.   On: 01/03/2015 13:51  01/03/2015  CLINICAL  DATA:  History of MGUS with worsening renal function. Shortness of breath. Pain all over body. History of breast cancer. EXAM: METASTATIC BONE SURVEY COMPARISON:  07/27/2014 bone scan, PET-CT 09/18/2014, and metastatic survey 12/06/2012 FINDINGS: No lytic or blastic lesions are identified to suggest metastases or multiple myeloma deposits. Patient is a edentulous. Degenerative changes are seen in the cervical, thoracic, and lumbar spine. Significant disc height loss and sclerosis identified at L3-4. Previous ORIF of right distal tibia and fibular fractures. Small probable bone island identified within the distal third of the left radius. The left radius was not imaged previously. Superior mediastinal mass consistent  with enlarged thyroid as seen on prior studies. There has been development of lucency at the right lung base. Patient has a right pleural catheter. There is small right pleural effusion. Suspect interval development of right basilar pneumothorax. Small left pleural effusion is present. There is left lower lobe infiltrate, stable in appearance. Status post bilateral mastectomy and bilateral axillary node dissection. IMPRESSION: 1. Interval development of right basilar pneumothorax. 2. Persistent small right pleural effusion. 3. Persistent left lower lobe infiltrate and pleural effusion. 4. Large right mediastinal mass stable and consistent with enlarged thyroid. 5. No suspicious lytic or blastic lesions. Critical Value/emergent results will be telephoned to the patient's provider Levita Monical [facility] at the time of interpretation on date 01/03/2015 at time 1:44 pm. Electronically Signed: By: Nolon Nations M.D. On: 01/03/2015 13:44     CBC  Recent Labs Lab 01/01/15 1125 01/03/15 0341 01/04/15 0215 01/05/15 0545 01/07/15 0256  WBC 4.8 5.0 5.9 6.9 6.9  HGB 8.9* 8.4* 7.9* 8.2* 7.8*  HCT 28.3* 26.3* 24.9* 25.3* 24.7*  PLT 319 287 319 317 190  MCV 86.3 86.5 85.9 86.3 87.3  MCH 27.1 27.6 27.2  28.0 27.6  MCHC 31.4 31.9 31.7 32.4 31.6  RDW 17.3* 17.0* 16.7* 16.8* 16.9*  LYMPHSABS  --   --  0.8  --   --   MONOABS  --   --  0.7  --   --   EOSABS  --   --  0.3  --   --   BASOSABS  --   --  0.0  --   --     Chemistries   Recent Labs Lab 01/01/15 1125  01/03/15 0341 01/04/15 0215 01/05/15 0545 01/06/15 0156 01/07/15 0256  NA 139  < > 134* 135 136 135 136  K 5.9*  < > 4.7 4.4 4.6 4.5 4.4  CL 110  < > 104 104 102 101 102  CO2 20*  < > 21* 23 24 23 23   GLUCOSE 127*  < > 108* 106* 113* 108* 91  BUN 159*  < > 159* 156* 172* 115* 73*  CREATININE 6.99*  < > 6.13* 6.01* 6.19* 5.02* 4.10*  CALCIUM 8.9  < > 8.5* 8.8* 9.0 8.5* 8.4*  AST 28  --   --   --   --   --   --   ALT 30  --   --   --   --   --   --   ALKPHOS 93  --   --   --   --   --   --   BILITOT 0.3  --   --   --   --   --   --   < > = values in this interval not displayed. ------------------------------------------------------------------------------------------------------------------ estimated creatinine clearance is 9.8 mL/min (by C-G formula based on Cr of 4.1). ------------------------------------------------------------------------------------------------------------------ No results for input(s): HGBA1C in the last 72 hours. ------------------------------------------------------------------------------------------------------------------ No results for input(s): CHOL, HDL, LDLCALC, TRIG, CHOLHDL, LDLDIRECT in the last 72 hours. ------------------------------------------------------------------------------------------------------------------ No results for input(s): TSH, T4TOTAL, T3FREE, THYROIDAB in the last 72 hours.  Invalid input(s): FREET3 ------------------------------------------------------------------------------------------------------------------ No results for input(s): VITAMINB12, FOLATE, FERRITIN, TIBC, IRON, RETICCTPCT in the last 72 hours.  Coagulation profile  Recent Labs Lab 01/03/15 0341  01/04/15 0215 01/05/15 0545 01/06/15 0156 01/07/15 0256  INR 5.15* 1.91* 1.31 1.44 1.37    No results for input(s): DDIMER in the last 72 hours.  Cardiac Enzymes No results for input(s): CKMB, TROPONINI,  MYOGLOBIN in the last 168 hours.  Invalid input(s): CK ------------------------------------------------------------------------------------------------------------------ Invalid input(s): POCBNP     Time Spent in minutes   35 minutes   Elio Haden M.D on 01/07/2015 at 11:00 AM  Between 7am to 7pm - Pager - [phone]  After 7pm go to [email or web address] - password [facility]  [facility]   Office  [phone]

## 2015-01-08 ENCOUNTER — Encounter (HOSPITAL_COMMUNITY): Payer: Self-pay | Admitting: Vascular Surgery

## 2015-01-08 LAB — GLUCOSE, CAPILLARY
GLUCOSE-CAPILLARY: 92 mg/dL (ref 65–99)
GLUCOSE-CAPILLARY: 163 mg/dL — AB (ref 65–99)

## 2015-01-08 LAB — CBC
HEMATOCRIT: 25.3 % — AB (ref 36.0–46.0)
HEMOGLOBIN: 8 g/dL — AB (ref 12.0–15.0)
MCH: 27.8 pg (ref 26.0–34.0)
MCHC: 31.6 g/dL (ref 30.0–36.0)
MCV: 87.8 fL (ref 78.0–100.0)
PLATELETS: 212 10*3/uL (ref 150–400)
RBC: 2.88 MIL/uL — AB (ref 3.87–5.11)
RDW: 17.3 % — ABNORMAL HIGH (ref 11.5–15.5)
WBC: 8.2 10*3/uL (ref 4.0–10.5)

## 2015-01-08 LAB — RENAL FUNCTION PANEL
ALBUMIN: 2.1 g/dL — AB (ref 3.5–5.0)
Anion gap: 10 (ref 5–15)
BUN: 79 mg/dL — AB (ref 6–20)
CO2: 25 mmol/L (ref 22–32)
CREATININE: 4.8 mg/dL — AB (ref 0.44–1.00)
Calcium: 8.8 mg/dL — ABNORMAL LOW (ref 8.9–10.3)
Chloride: 105 mmol/L (ref 101–111)
GFR calc Af Amer: 9 mL/min — ABNORMAL LOW (ref >60)
GFR calc non Af Amer: 8 mL/min — ABNORMAL LOW (ref >60)
GLUCOSE: 111 mg/dL — AB (ref 65–99)
PHOSPHORUS: 6.3 mg/dL — AB (ref 2.5–4.6)
Potassium: 4.2 mmol/L (ref 3.5–5.1)
SODIUM: 140 mmol/L (ref 135–145)

## 2015-01-08 LAB — ANTITHROMBIN III: AntiThromb III Func: 81 % (ref 75–120)

## 2015-01-08 LAB — HEPARIN LEVEL (UNFRACTIONATED): Heparin Unfractionated: 0.72 IU/mL — ABNORMAL HIGH (ref 0.30–0.70)

## 2015-01-08 LAB — APTT

## 2015-01-08 MED ORDER — DARBEPOETIN ALFA 100 MCG/0.5ML IJ SOSY
PREFILLED_SYRINGE | INTRAMUSCULAR | Status: AC
  Filled 2015-01-08: qty 0.5

## 2015-01-08 MED ORDER — DARBEPOETIN ALFA 100 MCG/0.5ML IJ SOSY
100.0000 ug | PREFILLED_SYRINGE | INTRAMUSCULAR | Status: DC
  Administered 2015-01-08: 100 ug via INTRAVENOUS

## 2015-01-08 NOTE — Progress Notes (Signed)
01/08/2015 4:11 PM Hemodialysis Outpatient update; this patient has been accepted at the [facility] center on a Monday,Wednesday and Friday 2nd shift schedule. The center would like to anticipate beginning the patient on Monday November 7th at 12:10 PM. If they have to start the patient this week they can only start her on a TTS schedule and I will have to follow up on her time of arrival. Thank you. Gordy Savers

## 2015-01-08 NOTE — Progress Notes (Signed)
Patient Demographics  Alison Gross, is a 81 y.o. female, DOB - September 27, 1932, HUD:[id number]  Admit date - 01/01/2015   Admitting Physician Debbe Odea, MD  Outpatient Primary MD for the patient is Maximino Greenland, MD  LOS - 7   Chief Complaint  Patient presents with  . Shortness of Breath       Admission HPI/Brief narrative: 81 year old female with history of HTN, DM, chronic diastolic CHF, breast cancer status post bilateral mastectomies, PAF, chronic kidney disease stage 4(baseline creatinine~2), recurrent exudative right pleural effusion s/p right Pleurx catheter, right axillary/subclavian DVT, M spike presented to [facility] ED on 01/01/15 with worsening dyspnea, weight gain, weakness and worsening renal functions. She was seen by her cardiologist on 10/19 and Lasix was changed to [facility]. In the ED, creatinine up from 2.5-6.9, BNP 2364, hypotensive range blood pressures in the 80s-90s, elevated troponin and supratherapeutic INR 5.8. She was hospitalized for management of acute on chronic renal failure, acute on chronic diastolic CHF and hypotension. Advanced CHF team and nephrology are consulting. INR supratherapeutic on admission, dictated with vitamin K for permacath and bone marrow biopsy,  Patient had permacath inserted 10/28 , and was started on hemodialysis 10/28, patient is on heparin drip pending permanent access insertion of vascular surgery this coming Wednesday. Subjective:   Conor Filsaime today has, No headache, No chest pain, No abdominal pain -  no further nausea or vomiting . Assessment & Plan    Principal Problem:   Acute on chronic renal failure (HCC) Active Problems:   MGUS (monoclonal gammopathy of unknown significance)   Ductal carcinoma in situ (DCIS) of left breast   Acute on chronic diastolic heart failure (HCC)   Type 2 diabetes mellitus with renal manifestations (HCC)    Hypotension   Substernal goiter   PAF (paroxysmal atrial fibrillation) (HCC)   Diastolic CHF, acute on chronic (HCC)   Stage 4 chronic kidney disease due to arterionephrosclerosis (HCC)   Pleural effusion exudative   Supratherapeutic INR   ARF (acute renal failure) (HCC)  Acute on stage IV chronic kidney disease/hyperkalemia - Baseline creatinine ~2, Admitted with creatinine of 6.9. - Most likely related to Cardiorenal syndrome and acute diastolic dysfunction, less likely a due to MGUS(increased lambda chain may be related to her renal failure not causing it as per hematology). - renal ultrasound-no hydronephrosis ,Diffusely echogenic kidneys, compatible with medical renal disease - Nephrology consult appreciated, diuresis managed by nephrology. - Hematology consult appreciated, patient went for bone marrow biopsy 10/27 to evaluate for possible myeloma., Results pending - Started hemodialysis on 10/28 after permacath insertion on 10/28,  - CLIP pending - VVS following for access, currently using permacath, unclear if patient and tolerate permanent access given her hypotension, VVS will reevaluate tomorrowTo see if they'll proceed on Wednesday.  Acute on chronic diastolic CHF - Patient is significantly volume overloaded on admission in the setting of worsening renal function. - Repeat echo remains was preserved LV function - Patient to be improving with dialysis  Recurrent right pleural effusion, s/p Pleurx catheter - cytology from her Right effusion has been negative x4, being drained daily, .   Paroxysmal atrial fibrillation - Heart rate controlled - INR supratherapeutic initially, corrected with vitamin K for need of surgical procedures  include permacath insertion and bone marrow biopsy. - Continue to hold warfarin in anticipation for AV fistula/graft this wednesday, currently patient on heparin gtt. -Continue amiodarone. Metoprolol on hold secondary to hypotension.   History of  breast cancer status post bilateral mastectomies 2014 - Oncology input appreciated. In remission.  MGUS - Oncology input appreciated. Do not believe that her breast cancer history  -Oncology consult appreciated, and had bone marrow biopsy on 10/27, results still pending(discussed with pathology lab, results should be out today )  Iron deficiency anemia - on  Aranesp & IV Feraheme - follow CBC's, may need transfusion in a.m. during dialysis.  Hypotension -  Holding blood pressure medications.  on midodrine  Uncontrolled DM 2 with renal complications - Holding oral diabetics. Continue NovoLog SSI. Controlled.  Supratherapeutic INR - Admitted with INR of 5.8. Likely elevated from acute illness. Improved with vitamin K ,   Goiter - Abnormal TSH <0.010. May be related to amiodarone. free T4 is elevated at 3.48 , free T 3 wnl at 2.3, this picture is compatible with hyperthyroidism, discussed with Dr. Buddy Duty,  will follow with her as an outpatient(his office will call her with an appointment), will start on low-dose methimazole, may need to change amiodarone when patient is more stable as alternatives now giving her low blood pressure.  Nausea and vomiting - Improving, no acute finding on chest x-ray  Severe protein calorie malnutrition - Started on Nepro  Code Status: Full  Family Communication: Daughter  Disposition Plan: Remains in stepdown, pending further workup   Procedures  - Bone marrow biopsy by IR 10/27 - Patient has a right Pleurx catheter from prior to admission, which is drained daily. - Permacath insertion on 10/28  [place]   Cardiology CHF Nephrology Hematology Vascular surgery   Medications  Scheduled Meds: . amiodarone  200 mg Oral Daily  . anastrozole  1 mg Oral Daily  . darbepoetin (ARANESP) injection - DIALYSIS  100 mcg Intravenous Q Mon-HD  . feeding supplement (ENSURE ENLIVE)  237 mL Oral BID BM  . feeding supplement (NEPRO CARB STEADY)  237 mL  Oral TID WC  . ferumoxytol  510 mg Intravenous Weekly  . insulin aspart  0-9 Units Subcutaneous TID WC  . methimazole  20 mg Oral Daily  . midodrine  10 mg Oral TID WC  . multivitamin  1 tablet Oral QHS  . senna-docusate  1 tablet Oral BID  . sodium chloride  3 mL Intravenous Q12H  . thiamine  100 mg Oral Daily   Continuous Infusions: . heparin 1,750 Units/hr (01/07/15 2207)   PRN Meds:.sodium chloride, acetaminophen, albuterol, metoCLOPramide (REGLAN) injection, ondansetron (ZOFRAN) IV, oxyCODONE-acetaminophen, sodium chloride  DVT Prophylaxis  on lovenox  Lab Results  Component Value Date   PLT 212 01/08/2015    Antibiotics    Anti-infectives    Start     Dose/Rate Route Frequency Ordered Stop   01/05/15 1230  [MAR Hold]  cefUROXime (ZINACEF) 1.5 g in dextrose 5 % 50 mL IVPB     (MAR Hold since 01/05/15 0625)   1.5 g 100 mL/hr over 30 Minutes Intravenous To [facility] 01/04/15 1131 01/05/15 0830          Objective:   Filed Vitals:   01/08/15 0945 01/08/15 1000 01/08/15 1029 01/08/15 1140  BP: 74/37 82/38 86/40  94/40  Pulse: 64 64 63   Temp:   98.2 F (36.8 C) 99.3 F (37.4 C)  TempSrc:   Oral Oral  Resp:  18   Height:      Weight:   55.1 kg (121 lb 7.6 oz)   SpO2:   100%     Wt Readings from Last 3 Encounters:  01/08/15 55.1 kg (121 lb 7.6 oz)  12/28/14 60.328 kg (133 lb)  12/11/14 58.06 kg (128 lb)     Intake/Output Summary (Last 24 hours) at 01/08/15 1400 Last data filed at 01/08/15 1029  Gross per 24 hour  Intake 237.33 ml  Output   1497 ml  Net -1259.67 ml     Physical Exam General exam: Elderly frail thin-appearing female patient sitting up comfortably in bed without distress.  Respiratory system: Reduced breath sounds in the bases with scattered occasional bibasal crackles. Rest of lung fields clear to auscultation. No increased work of breathing. Cardiovascular system: S1 & S2 heard, RRR. No JVD, murmurs, gallops, clicks or  pedal edema Gastrointestinal system: Abdomen is nondistended, soft and nontender. Normal bowel sounds heard. Central nervous system: Alert and oriented. No focal neurological deficits. Extremities: Symmetric 5 x 5 power.   Data Review   Micro Results Recent Results (from the past 240 hour(s))  MRSA PCR Screening     Status: None   Collection Time: 01/01/15  3:10 PM  Result Value Ref Range Status   MRSA by PCR NEGATIVE NEGATIVE Final    Comment:        The GeneXpert MRSA Assay (FDA approved for NASAL specimens only), is one component of a comprehensive MRSA colonization surveillance program. It is not intended to diagnose MRSA infection nor to guide or monitor treatment for MRSA infections.   Urine culture     Status: None   Collection Time: 01/01/15  4:46 PM  Result Value Ref Range Status   Specimen Description URINE, CLEAN CATCH  Final   Special Requests NONE  Final   Culture MULTIPLE SPECIES PRESENT, SUGGEST RECOLLECTION  Final   Report Status 01/03/2015 FINAL  Final  Surgical pcr screen     Status: None   Collection Time: 01/05/15  5:47 AM  Result Value Ref Range Status   MRSA, PCR NEGATIVE NEGATIVE Final   Staphylococcus aureus NEGATIVE NEGATIVE Final    Comment:        The Xpert SA Assay (FDA approved for NASAL specimens in patients over 59 years of age), is one component of a comprehensive surveillance program.  Test performance has been validated by [facility] for patients greater than or equal to 36 year old. It is not intended to diagnose infection nor to guide or monitor treatment.     Radiology Reports Dg Chest 2 View  01/01/2015  CLINICAL DATA:  Shortness of Breath EXAM: CHEST  2 VIEW COMPARISON:  December 28, 2014 chest radiograph and CT of the neck and upper chest September 21, 2014 FINDINGS: There remain small pleural effusions bilaterally with bibasilar atelectasis. There is a drainage catheter on the left, unchanged in position. No pneumothorax.  Heart is enlarged with pulmonary vascular within normal limits. The large right substernal mass, noted previously to represent marked thyroid enlargement, is again noted and stable. No adenopathy appreciable. IMPRESSION: The marked enlargement of the right lobe of the thyroid is again noted. There are small pleural effusions bilaterally with bibasilar atelectasis. In comparison with recent chest radiograph, there is slightly less atelectasis and effusion on the left and right sides. No new opacity. No change in cardiac silhouette. Electronically Signed   By: [name] III M.D.   On: 01/01/2015 12:48  Dg Chest 2 View  12/28/2014  CLINICAL DATA:  Pleural effusion. EXAM: CHEST  2 VIEW COMPARISON:  12/11/2014.  11/16/2014.  CT 09/21/2014 FINDINGS: Stable right paratracheal substernal mass consistent previously thyroid lesion. Stable cardiomegaly. No focal acute infiltrate. Scratch Stable small right pleural effusion. Stable small left pleural effusion. Right chest tube in stable position. No pneumothorax. Surgical clips noted over the right chest. IMPRESSION: 1. Right chest tube in stable position. Stable bilateral pleural effusions. 2. Scratched stable large right paratracheal thyroid mass. Electronically Signed   By: Marcello Moores  Register   On: 12/28/2014 12:32   Dg Chest 2 View  12/11/2014  CLINICAL DATA:  Followup pleural effusions. Breast carcinoma. Goiter. EXAM: CHEST  2 VIEW COMPARISON:  11/16/2014 and previous CT on 09/19/2014 FINDINGS: Right chest tube remains in place. Small right pleural effusion shows mild decrease in size since previous study. No pneumothorax visualized. Small left pleural effusion remains stable. Heart size remains stable. Large right mediastinal mass is unchanged and consistent with substernal goiter shown on recent CT. IMPRESSION: Mild decrease in right pleural effusion. No pneumothorax visualized. Stable small left pleural effusion. Stable mediastinal mass, consistent with  substernal goiter showed on recent CT. Electronically Signed   By: Earle Gell M.D.   On: 12/11/2014 14:38   Dg Chest Left Decubitus  01/03/2015  CLINICAL DATA:  Pleural effusion. EXAM: CHEST - LEFT DECUBITUS COMPARISON:  January 01, 2015. FINDINGS: Small free flowing left pleural effusion is noted. Stable large amount of right thyroid lobe. Pleural drainage catheter is noted on the right. IMPRESSION: Small free flowing left pleural effusion is noted. Electronically Signed   By: Marijo Conception, M.D.   On: 01/03/2015 15:28   US Renal  01/01/2015  CLINICAL DATA:  Acute renal failure.  Diabetes. EXAM: RENAL / URINARY TRACT ULTRASOUND COMPLETE COMPARISON:  None. FINDINGS: Right Kidney: Length: 9.1 cm.  Diffusely echogenic.  No hydronephrosis. Left Kidney: Length: 9.4 cm. Diffusely echogenic. No hydronephrosis. Several tiny cysts. Bladder: Appears normal for degree of bladder distention. A small amount of free peritoneal fluid is noted. IMPRESSION: 1. Diffusely echogenic kidneys, compatible with medical renal disease. 2. No hydronephrosis. 3. Small amount of ascites. Electronically Signed   By: Claudie Revering M.D.   On: 01/01/2015 16:25   Ct Biopsy  01/04/2015  CLINICAL DATA:  81 year old female with a history of monoclonal gammopathy. Presents for bone marrow biopsy EXAM: CT-GUIDED BIOPSY bone marrow MEDICATIONS AND MEDICAL HISTORY: Versed 0.5 mg, Fentanyl 25 mcg. Additional Medications: None. ANESTHESIA/SEDATION: Moderate sedation time: 15 minutes PROCEDURE: The procedure risks, benefits, and alternatives were explained to the patient. Questions regarding the procedure were encouraged and answered. The patient understands and consents to the procedure. Scout CT of the pelvis was performed for surgical planning purposes. The posterior pelvis was prepped with Betadinein a sterile fashion, and a sterile drape was applied covering the operative field. A sterile gown and sterile gloves were used for the  procedure. Local anesthesia was provided with 1% Lidocaine. We targeted the right posterior iliac bone for biopsy. The skin and subcutaneous tissues were infiltrated with 1% lidocaine without epinephrine. A small stab incision was made with an 11 blade scalpel, and an 11 gauge Murphy needle was advanced with CT guidance to the posterior cortex. Manual forced was used to advance the needle through the posterior cortex and the stylet was removed. A bone marrow aspirate was retrieved and passed to a cytotechnologist in the room. The Murphy needle was then advanced without the  stylet for a core biopsy. The core biopsy was retrieved and also passed to a cytotechnologist. Manual pressure was used for hemostasis and a sterile dressing was placed. No complications were encountered no significant blood loss was encountered. Patient tolerated the procedure well and remained hemodynamically stable throughout. FINDINGS: Scout image demonstrates safe approach to posterior iliac bone. Images during the case demonstrate placement of 11 gauge Murphy needle COMPLICATIONS: None IMPRESSION: Status post CT-guided bone marrow biopsy, with tissue specimen sent to pathology for complete histopathologic analysis Signed, Dulcy Fanny. Earleen Newport, DO [facility] Electronically Signed   By: Corrie Mckusick D.O.   On: 01/04/2015 10:35   Dg Chest Port 1 View  01/05/2015  CLINICAL DATA:  Status post dialysis catheter insertion. EXAM: PORTABLE CHEST 1 VIEW COMPARISON:  January 04, 2015. FINDINGS: Stable cardiomediastinal silhouette. Large right superior mediastinal mass is unchanged. Interval placement of right internal jugular dialysis catheter with distal tips in the expected position of the SVC. No pneumothorax is noted. Mild bilateral pleural effusions are noted. Bibasilar opacities are noted concerning for edema or atelectasis. Surgical clips are noted in the axillary regions bilaterally.  Bony thorax is unremarkable. IMPRESSION: Interval placement of right internal jugular dialysis catheter with distal tips in the expected position of the SVC. No pneumothorax is noted. Stable large right superior mediastinal mass is noted. Mild bilateral pleural effusions are noted, with bibasilar opacities concerning for edema or atelectasis. Electronically Signed   By: Marijo Conception, M.D.   On: 01/05/2015 09:24   Dg Chest Port 1 View  01/04/2015  CLINICAL DATA:  Right pneumothorax. EXAM: PORTABLE CHEST 1 VIEW COMPARISON:  Left lateral decubitus chest radiograph and bone survey 01/03/2015. Chest radiographs 01/01/2015. FINDINGS: Right superior mediastinal mass is again noted consistent with markedly enlarged thyroid. The cardiac silhouette remains mildly enlarged. Mild central pulmonary vascular congestion remains. A right-sided pleural catheter remains in place. There are persistent small bilateral pleural effusions, unchanged from 01/01/2015. Patchy bibasilar opacities also do not appear significantly changed. No pneumothorax is identified. IMPRESSION: 1. Unchanged small bilateral pleural effusions and bibasilar atelectasis. 2. No pneumothorax identified. Electronically Signed   By: Logan Bores M.D.   On: 01/04/2015 07:37   Dg Abd Portable 1v  01/06/2015  CLINICAL DATA:  Nausea/ vomiting, on dialysis EXAM: PORTABLE ABDOMEN - 1 VIEW COMPARISON:  None. FINDINGS: Nonobstructive bowel gas pattern. Moderate colonic stool burden. Degenerative changes of the lumbar spine. Right chest drain, incompletely visualized. IMPRESSION: No evidence of bowel obstruction. Moderate colonic stool burden, raising the possibility of constipation. Electronically Signed   By: Julian Hy M.D.   On: 01/06/2015 11:11   Dg Bone Survey Met  01/03/2015  ADDENDUM REPORT: 01/03/2015 13:51 ADDENDUM: Critical Value/emergent results were discussed with the patient's provider Milynn Quirion on 01/03/2015 at time 1:50 pm.  Electronically Signed   By: Nolon Nations M.D.   On: 01/03/2015 13:51  01/03/2015  CLINICAL DATA:  History of MGUS with worsening renal function. Shortness of breath. Pain all over body. History of breast cancer. EXAM: METASTATIC BONE SURVEY COMPARISON:  07/27/2014 bone scan, PET-CT 09/18/2014, and metastatic survey 12/06/2012 FINDINGS: No lytic or blastic lesions are identified to suggest metastases or multiple myeloma deposits. Patient is a edentulous. Degenerative changes are seen in the cervical, thoracic, and lumbar spine. Significant disc height loss and sclerosis identified at L3-4. Previous ORIF of right distal tibia and fibular fractures. Small probable bone island identified within the distal third of the left  radius. The left radius was not imaged previously. Superior mediastinal mass consistent with enlarged thyroid as seen on prior studies. There has been development of lucency at the right lung base. Patient has a right pleural catheter. There is small right pleural effusion. Suspect interval development of right basilar pneumothorax. Small left pleural effusion is present. There is left lower lobe infiltrate, stable in appearance. Status post bilateral mastectomy and bilateral axillary node dissection. IMPRESSION: 1. Interval development of right basilar pneumothorax. 2. Persistent small right pleural effusion. 3. Persistent left lower lobe infiltrate and pleural effusion. 4. Large right mediastinal mass stable and consistent with enlarged thyroid. 5. No suspicious lytic or blastic lesions. Critical Value/emergent results will be telephoned to the patient's provider Azhar Knope [facility] at the time of interpretation on date 01/03/2015 at time 1:44 pm. Electronically Signed: By: Nolon Nations M.D. On: 01/03/2015 13:44     CBC  Recent Labs Lab 01/03/15 0341 01/04/15 0215 01/05/15 0545 01/07/15 0256 01/08/15 0741  WBC 5.0 5.9 6.9 6.9 8.2  HGB 8.4* 7.9* 8.2* 7.8* 8.0*  HCT 26.3* 24.9*  25.3* 24.7* 25.3*  PLT 287 319 317 190 212  MCV 86.5 85.9 86.3 87.3 87.8  MCH 27.6 27.2 28.0 27.6 27.8  MCHC 31.9 31.7 32.4 31.6 31.6  RDW 17.0* 16.7* 16.8* 16.9* 17.3*  LYMPHSABS  --  0.8  --   --   --   MONOABS  --  0.7  --   --   --   EOSABS  --  0.3  --   --   --   BASOSABS  --  0.0  --   --   --     Chemistries   Recent Labs Lab 01/04/15 0215 01/05/15 0545 01/06/15 0156 01/07/15 0256 01/08/15 0741  NA 135 136 135 136 140  K 4.4 4.6 4.5 4.4 4.2  CL 104 102 101 102 105  CO2 23 24 23 23 25   GLUCOSE 106* 113* 108* 91 111*  BUN 156* 172* 115* 73* 79*  CREATININE 6.01* 6.19* 5.02* 4.10* 4.80*  CALCIUM 8.8* 9.0 8.5* 8.4* 8.8*   ------------------------------------------------------------------------------------------------------------------ estimated creatinine clearance is 8.1 mL/min (by C-G formula based on Cr of 4.8). ------------------------------------------------------------------------------------------------------------------ No results for input(s): HGBA1C in the last 72 hours. ------------------------------------------------------------------------------------------------------------------ No results for input(s): CHOL, HDL, LDLCALC, TRIG, CHOLHDL, LDLDIRECT in the last 72 hours. ------------------------------------------------------------------------------------------------------------------ No results for input(s): TSH, T4TOTAL, T3FREE, THYROIDAB in the last 72 hours.  Invalid input(s): FREET3 ------------------------------------------------------------------------------------------------------------------ No results for input(s): VITAMINB12, FOLATE, FERRITIN, TIBC, IRON, RETICCTPCT in the last 72 hours.  Coagulation profile  Recent Labs Lab 01/03/15 0341 01/04/15 0215 01/05/15 0545 01/06/15 0156 01/07/15 0256  INR 5.15* 1.91* 1.31 1.44 1.37    No results for input(s): DDIMER in the last 72 hours.  Cardiac Enzymes No results for input(s): CKMB,  TROPONINI, MYOGLOBIN in the last 168 hours.  Invalid input(s): CK ------------------------------------------------------------------------------------------------------------------ Invalid input(s): POCBNP     Time Spent in minutes   35 minutes   Murry Diaz M.D on 01/08/2015 at 2:00 PM  Between 7am to 7pm - Pager - [phone]  After 7pm go to [email or web address] - password [facility]  [facility]   Office  [phone]

## 2015-01-08 NOTE — Progress Notes (Signed)
   Daily Progress Note  Filed Vitals:   01/08/15 0100 01/08/15 0300 01/08/15 0409 01/08/15 0500  BP: 96/42 108/65 85/38   Pulse: 68 72 63   Temp:   98.2 F (36.8 C)   TempSrc:   Oral   Resp: '18 24 16   '$ Height:      Weight:    126 lb 8.7 oz (57.4 kg)  SpO2: 93% 96% 98%      ESRD, persistent hypotension multifactorial   Unless hypotension is improved, doubt any access is likely to stay open for very long  Will reevaluate tomorrow to see if pt ready to proceed on Wednesday   Adele Barthel, MD [facility] [place] Office: [phone] Pager: [phone]  01/08/2015, 7:35 AM

## 2015-01-08 NOTE — Procedures (Signed)
I was present at this dialysis session. I have reviewed the session itself and made appropriate changes.   UF goal 2L, Having ongoing IDH, rec midodrine this AM, still with 2+ LEE, pitting Using TDC, VVS following for access Hb remains low, will dose ESA today with HD CLIP pending, no chair yet No c/o from patient.   BM Bx pending  Pearson Grippe  MD 01/08/2015, 10:22 AM

## 2015-01-08 NOTE — Progress Notes (Signed)
01/08/2015 patient plurex drain was done at 1545. She had 350 cc on dark yellow fluid, clear. [facility] Rn

## 2015-01-08 NOTE — Progress Notes (Signed)
ANTICOAGULATION CONSULT NOTE - Follow Up Consult  Pharmacy Consult for Heparin Indication: atrial fibrillation  Patient on chronic anticoagulation for afib. Admit INR 5.88 on Coumadin '2mg'$  daily, dose held 10/24, vitamin k 5 mg po given 10/26 at noon, vitamin K 10 mg IV given 10/26 at 1604. NR 1.37. Continue to hold Coumadin for AVF/AVG placement on Wednesday 11/2.Marland Kitchen   Heparin level remains <0.1 x5. (Running appropriately, no problems with infusion). Last evening, NP on call wanted to try to increase heparin 1 more time before changing agents. Some concern her hand is very edematous and levels drawn from there are dilutes.   This morning, patient went to HD and heparin level, aPTT and ATIII were drawn there- HL 0.72 with aPTT >200. Spoke with HD RN and she drew from HD cath which is heparin locked, so levels are not accurate.  ATIII 81%, which is in range (she isn't a non-responder)   Goal of Therapy:  Heparin level 0.3-0.7 units/ml Monitor platelets by anticoagulation protocol: Yes  Plan:  -Continue IV heparin at 1750 units/hr and recheck HL and aPTT at 1300 with a footstick -daily HL and CBC -follow s/s bleeding   No Known Allergies  Patient Measurements: Height: '5\' 5"'$  (165.1 cm) Weight: 124 lb 12.5 oz (56.6 kg) IBW/kg (Calculated) : 57 Heparin Dosing Weight:  56.7 kg  Vital Signs: Temp: 98.3 F (36.8 C) (10/31 0714) Temp Source: Oral (10/31 0714) BP: 87/38 mmHg (10/31 0932) Pulse Rate: 64 (10/31 0932)  Labs:  Recent Labs  01/06/15 0156  01/07/15 0256 01/07/15 0818 01/07/15 1840 01/08/15 0741 01/08/15 0742  HGB  --   --  7.8*  --   --  8.0*  --   HCT  --   --  24.7*  --   --  25.3*  --   PLT  --   --  190  --   --  212  --   APTT  --   --   --   --   --   --  >200*  LABPROT 17.6*  --  17.0*  --   --   --   --   INR 1.44  --  1.37  --   --   --   --   HEPARINUNFRC <0.10*  < >  --  <0.10* <0.10* 0.72*  --   CREATININE 5.02*  --  4.10*  --   --  4.80*  --   < >  = values in this interval not displayed.  Estimated Creatinine Clearance: 8.4 mL/min (by C-G formula based on Cr of 4.8).  Larren Copes D. Denai Caba, PharmD, BCPS Clinical Pharmacist Pager: [phone] 01/08/2015 9:38 AM

## 2015-01-08 NOTE — Progress Notes (Signed)
PT Cancellation Note  Patient Details Name: Alison Gross MRN: [id number] DOB: 1932-12-15   Cancelled Treatment:    Reason Eval/Treat Not Completed: Patient at procedure or test/unavailable (IV team in room.  Will return as able.  Thanks. )   Dema Severin, Abdulai Blaylock F 01/08/2015, 2:47 PM  [place],PT Acute Rehabilitation [phone] (pager)

## 2015-01-08 NOTE — Progress Notes (Signed)
PT Cancellation Note  Patient Details Name: Alison Gross MRN: [id number] DOB: 1932-09-30   Cancelled Treatment:    Reason Eval/Treat Not Completed: Patient at procedure or test/unavailable (In Hemodialysis.  Will return as able.  Thanks.)   Irwin Brakeman F 01/08/2015, 10:11 AM  [facility] [phone] (pager)

## 2015-01-08 NOTE — Progress Notes (Signed)
I saw Alison Gross this morning in dialysis. She reports being appreciative of my visit.  She states that she is still considering everything we discussed yesterday, and asked if I could follow-up tomorrow when her husband is here.  We'll plan follow-up tomorrow to continue conversation regarding goals moving forward. I do think that she would benefit from completion of MOST form for her to take with her on discharge. I will plan on completing this with [name] and her husband tomorrow if they are agreeable to doing so.  Micheline Rough, MD [place] [facility] [phone]

## 2015-01-08 NOTE — Progress Notes (Signed)
COURTESY NOTE:  Alison Gross's bone marrow biopsy shows 14% plasma cells. Other relevant findings include a negative bone survey on 01/03/2015, a Lambda ratio of 0.21 and M-spike of 0.6. 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  I do not believe treating her paraprotein would improve her renal function. I will be interested in what happens to the kappa/lambda ratio after she has been on regular HD.  Will follow with you while admitted. She sees me early December as outpatient.

## 2015-01-09 ENCOUNTER — Other Ambulatory Visit: Payer: Self-pay | Admitting: Oncology

## 2015-01-09 DIAGNOSIS — J90 Pleural effusion, not elsewhere classified: Secondary | ICD-10-CM

## 2015-01-09 DIAGNOSIS — L8990 Pressure ulcer of unspecified site, unspecified stage: Secondary | ICD-10-CM | POA: Insufficient documentation

## 2015-01-09 DIAGNOSIS — N186 End stage renal disease: Secondary | ICD-10-CM

## 2015-01-09 DIAGNOSIS — E039 Hypothyroidism, unspecified: Secondary | ICD-10-CM

## 2015-01-09 DIAGNOSIS — I480 Paroxysmal atrial fibrillation: Secondary | ICD-10-CM

## 2015-01-09 DIAGNOSIS — I9589 Other hypotension: Secondary | ICD-10-CM

## 2015-01-09 DIAGNOSIS — E1121 Type 2 diabetes mellitus with diabetic nephropathy: Secondary | ICD-10-CM

## 2015-01-09 DIAGNOSIS — D0512 Intraductal carcinoma in situ of left breast: Secondary | ICD-10-CM

## 2015-01-09 DIAGNOSIS — D631 Anemia in chronic kidney disease: Secondary | ICD-10-CM

## 2015-01-09 LAB — GLUCOSE, CAPILLARY
GLUCOSE-CAPILLARY: 93 mg/dL (ref 65–99)
GLUCOSE-CAPILLARY: 128 mg/dL — AB (ref 65–99)
GLUCOSE-CAPILLARY: 111 mg/dL — AB (ref 65–99)
GLUCOSE-CAPILLARY: 139 mg/dL — AB (ref 65–99)
Glucose-Capillary: 118 mg/dL — ABNORMAL HIGH (ref 65–99)
Glucose-Capillary: 122 mg/dL — ABNORMAL HIGH (ref 65–99)

## 2015-01-09 LAB — RENAL FUNCTION PANEL
Albumin: 2.1 g/dL — ABNORMAL LOW (ref 3.5–5.0)
Anion gap: 9 (ref 5–15)
BUN: 49 mg/dL — ABNORMAL HIGH (ref 6–20)
CALCIUM: 8.2 mg/dL — AB (ref 8.9–10.3)
CHLORIDE: 105 mmol/L (ref 101–111)
CO2: 24 mmol/L (ref 22–32)
CREATININE: 4.07 mg/dL — AB (ref 0.44–1.00)
GFR calc non Af Amer: 9 mL/min — ABNORMAL LOW (ref >60)
GFR, EST AFRICAN AMERICAN: 11 mL/min — AB (ref >60)
Glucose, Bld: 101 mg/dL — ABNORMAL HIGH (ref 65–99)
PHOSPHORUS: 4.8 mg/dL — AB (ref 2.5–4.6)
Potassium: 4.1 mmol/L (ref 3.5–5.1)
Sodium: 138 mmol/L (ref 135–145)

## 2015-01-09 LAB — CBC
HCT: 23.9 % — ABNORMAL LOW (ref 36.0–46.0)
Hemoglobin: 7.7 g/dL — ABNORMAL LOW (ref 12.0–15.0)
MCH: 28.4 pg (ref 26.0–34.0)
MCHC: 32.2 g/dL (ref 30.0–36.0)
MCV: 88.2 fL (ref 78.0–100.0)
PLATELETS: 92 10*3/uL — AB (ref 150–400)
RBC: 2.71 MIL/uL — AB (ref 3.87–5.11)
RDW: 18.2 % — ABNORMAL HIGH (ref 11.5–15.5)
WBC: 7.3 10*3/uL (ref 4.0–10.5)

## 2015-01-09 LAB — PREPARE RBC (CROSSMATCH)

## 2015-01-09 MED ORDER — RENA-VITE PO TABS: 1.0000 | ORAL_TABLET | Freq: Every day | ORAL | Status: DC

## 2015-01-09 MED ORDER — SENNOSIDES-DOCUSATE SODIUM 8.6-50 MG PO TABS: 1.0000 | ORAL_TABLET | Freq: Two times a day (BID) | ORAL | Status: DC

## 2015-01-09 MED ORDER — METHIMAZOLE 10 MG PO TABS: 20.0000 mg | ORAL_TABLET | Freq: Every day | ORAL | Status: DC

## 2015-01-09 MED ORDER — NEPRO/CARBSTEADY PO LIQD: 237.0000 mL | Freq: Three times a day (TID) | ORAL | Status: DC

## 2015-01-09 MED ORDER — ACETAMINOPHEN 325 MG PO TABS: 650.0000 mg | ORAL_TABLET | ORAL | Status: AC | PRN

## 2015-01-09 MED ORDER — DIGOXIN 125 MCG PO TABS: ORAL_TABLET | ORAL | Status: DC

## 2015-01-09 MED ORDER — DIGOXIN 125 MCG PO TABS
0.1250 mg | ORAL_TABLET | ORAL | Status: DC
  Administered 2015-01-10: 0.125 mg via ORAL
  Filled 2015-01-09: qty 1

## 2015-01-09 MED ORDER — MIDODRINE HCL 10 MG PO TABS: 10.0000 mg | ORAL_TABLET | Freq: Three times a day (TID) | ORAL | Status: DC

## 2015-01-09 MED ORDER — SODIUM CHLORIDE 0.9 % IV SOLN: Freq: Once | INTRAVENOUS | Status: DC

## 2015-01-09 MED ORDER — HEPARIN SODIUM (PORCINE) 5000 UNIT/ML IJ SOLN
5000.0000 [IU] | Freq: Three times a day (TID) | INTRAMUSCULAR | Status: DC
  Administered 2015-01-09 – 2015-01-10 (×4): 5000 [IU] via SUBCUTANEOUS
  Filled 2015-01-09 (×2): qty 1

## 2015-01-09 MED ORDER — WARFARIN - PHARMACIST DOSING INPATIENT
Freq: Every day | Status: DC
  Administered 2015-01-09: 18:00:00

## 2015-01-09 MED ORDER — WARFARIN SODIUM 5 MG PO TABS
5.0000 mg | ORAL_TABLET | Freq: Once | ORAL | Status: AC
  Administered 2015-01-09: 5 mg via ORAL
  Filled 2015-01-09: qty 1

## 2015-01-09 NOTE — Progress Notes (Signed)
Patient Demographics  Alison Gross, is a 81 y.o. female, DOB - 1932/12/13, ZOX:[id number]  Admit date - 01/01/2015   Admitting Physician Debbe Odea, MD  Outpatient Primary MD for the patient is Maximino Greenland, MD  LOS - 8   Chief Complaint  Patient presents with  . Shortness of Breath       Admission HPI/Brief narrative: 81 year old female with history of HTN, DM, chronic diastolic CHF, breast cancer status post bilateral mastectomies, PAF, chronic kidney disease stage 4(baseline creatinine~2), recurrent exudative right pleural effusion s/p right Pleurx catheter, right axillary/subclavian DVT, M spike presented to [facility] ED on 01/01/15 with worsening dyspnea, weight gain, weakness and worsening renal functions. She was seen by her cardiologist on 10/19 and Lasix was changed to [facility]. In the ED, creatinine up from 2.5-6.9, BNP 2364, hypotensive range blood pressures in the 80s-90s, elevated troponin and supratherapeutic INR 5.8. She was hospitalized for management of acute on chronic renal failure, acute on chronic diastolic CHF and hypotension. Advanced CHF team and nephrology are consulting. INR supratherapeutic on admission, dictated with vitamin K for permacath and bone marrow biopsy,  Patient had permacath inserted 10/28 , and was started on hemodialysis 10/28, warfarin has been held , patient was on heparin drip for anticoagulation pending graft placement , and given persistently low blood pressure this can be done electively as an outpatient , so patient resumed back on warfarin 11/1 . Subjective:   [name] today has, No headache, No chest pain, No abdominal pain -  no further nausea or vomiting . Assessment & Plan    Principal Problem:   Acute on chronic renal failure (HCC) Active Problems:   MGUS (monoclonal gammopathy of unknown significance)   Ductal carcinoma in situ (DCIS) of left  breast   Acute on chronic diastolic heart failure (HCC)   Type 2 diabetes mellitus with renal manifestations (HCC)   Hypotension   Substernal goiter   PAF (paroxysmal atrial fibrillation) (HCC)   Diastolic CHF, acute on chronic (HCC)   Stage 4 chronic kidney disease due to arterionephrosclerosis (HCC)   Pleural effusion exudative   Supratherapeutic INR   ARF (acute renal failure) (HCC)   Pressure ulcer  Acute on stage IV chronic kidney disease/hyperkalemia - Baseline creatinine ~2, Admitted with creatinine of 6.9. - Most likely related to Cardiorenal syndrome and acute diastolic dysfunction, less likely a due to MGUS(increased lambda chain may be related to her renal failure not causing it as per hematology). - renal ultrasound-no hydronephrosis ,Diffusely echogenic kidneys, compatible with medical renal disease - Hematology consult appreciated, patient went for bone marrow biopsy 10/27 to evaluate for possible myeloma., Results pending -  nephrology consult appreciated Started hemodialysis on 10/28 after permacath insertion on 10/28,  - VVS following for access, currently using permacath,  unlikely we will be able to place graft during hospital stay having had persistent hypotension and risk for graft thrombosis , can be done electively as an outpatient as discussed with vascular surgery Dr. Bridgett Larsson , so patient will be resumed back on warfarin . - Awaiting CLIP.  Acute on chronic diastolic CHF - Patient is significantly volume overloaded on admission in the setting of worsening renal function. - Repeat echo remains with preserved LV function -Volume  status proving with dialysisg with dialysis  Recurrent right pleural effusion, s/p Pleurx catheter - cytology from her Right effusion has been negative x4,  being drained daily,350 mL over last 24 hours, June output is decreasing daily most likely related to improving volume status with hemodialysis.  Paroxysmal atrial fibrillation - Heart  rate controlled - INR supratherapeutic initially, corrected with vitamin K for need of surgical procedures include permacath insertion and bone marrow biopsy. - Was on heparin drip , is continued yesterday at was very difficult to monitor her APTT as patient is a hard stick , giving no procedures planned during hospital stay will resume on warfarin. - Patient initially on amiodarone, which has been stopped giving her hyperthyroidism, be started on digoxin 0.125 mg under through Friday for heart rate control, discussed with Dr. Einar Gip.   History of breast cancer status post bilateral mastectomies 2014 - Oncology input appreciated. In remission.  MGUS - Oncology input appreciated. Bone marrow biopsy with plasmacytosis 14%, as per oncology "Her condition is somewhere between M-GUS (the marrow plamacytosis is >10%) and "smoldering myeloma" (the M-spike is <3 g), This requires only follow-up"  Iron deficiency anemia - on  Aranesp & IV Feraheme - Albumin is 7.7 today, will order 1 unit for transfusion tomorrow during hemodialysis.  Hypotension -  Holding blood pressure medications.  on midodrine  Uncontrolled DM 2 with renal complications - Holding oral diabetics. Continue NovoLog SSI. Controlled.  Supratherapeutic INR - Admitted with INR of 5.8. Likely elevated from acute illness. Improved with vitamin K ,  - will resume warfarin  Goiter - Abnormal TSH <0.010. May be related to amiodarone. free T4 is elevated at 3.48 , free T 3 wnl at 2.3, this picture is compatible with hyperthyroidism, discussed with Dr. Buddy Duty,  will follow with her as an outpatient(his office will call her with an appointment), will start on low-dose methimazole 20 mg oral daily , will start amiodarone as discussed with Dr. Einar Gip.  Nausea and vomiting - no acute finding on chest x-ray - Resolved  Severe protein calorie malnutrition - Started on Nepro  Code Status: Full  Family Communication: Daughter  Disposition  Plan: Remains in stepdown, pending further workup   Procedures  - Bone marrow biopsy by IR 10/27 - Patient has a right Pleurx catheter from prior to admission, which is drained daily. - Permacath insertion on 10/28  [place]   Cardiology CHF Nephrology Hematology Vascular surgery Palliative care  Medications  Scheduled Meds: . sodium chloride   Intravenous Once  . amiodarone  200 mg Oral Daily  . anastrozole  1 mg Oral Daily  . darbepoetin (ARANESP) injection - DIALYSIS  100 mcg Intravenous Q Mon-HD  . feeding supplement (NEPRO CARB STEADY)  237 mL Oral TID WC  . insulin aspart  0-9 Units Subcutaneous TID WC  . methimazole  20 mg Oral Daily  . midodrine  10 mg Oral TID WC  . multivitamin  1 tablet Oral QHS  . senna-docusate  1 tablet Oral BID  . sodium chloride  3 mL Intravenous Q12H  . thiamine  100 mg Oral Daily   Continuous Infusions:   PRN Meds:.sodium chloride, acetaminophen, albuterol, metoCLOPramide (REGLAN) injection, ondansetron (ZOFRAN) IV, oxyCODONE-acetaminophen, sodium chloride  DVT Prophylaxis  will start on subcutaneous heparin until INR is therapeutic  Lab Results  Component Value Date   PLT 92* 01/09/2015    Antibiotics    Anti-infectives    Start     Dose/Rate Route Frequency Ordered Stop  01/05/15 1230  [MAR Hold]  cefUROXime (ZINACEF) 1.5 g in dextrose 5 % 50 mL IVPB     (MAR Hold since 01/05/15 0625)   1.5 g 100 mL/hr over 30 Minutes Intravenous To ShortStay Surgical 01/04/15 1131 01/05/15 0830          Objective:   Filed Vitals:   01/09/15 0500 01/09/15 0700 01/09/15 1100 01/09/15 1200  BP:   90/36 90/36  Pulse:   57 62  Temp:  97.8 F (36.6 C)    TempSrc:  Oral    Resp:   16 17  Height:      Weight: 58 kg (127 lb 13.9 oz)     SpO2:   99% 98%    Wt Readings from Last 3 Encounters:  01/09/15 58 kg (127 lb 13.9 oz)  12/28/14 60.328 kg (133 lb)  12/11/14 58.06 kg (128 lb)     Intake/Output Summary (Last 24 hours) at  01/09/15 1242 Last data filed at 01/09/15 1027  Gross per 24 hour  Intake   2800 ml  Output      2 ml  Net   2798 ml     Physical Exam General exam: Elderly frail thin-appearing female patient sitting up comfortably in bed without distress.  Respiratory system: Reduced breath sounds in the bases with scattered occasional bibasal crackles. Rest of lung fields clear to auscultation. No increased work of breathing. Cardiovascular system: S1 & S2 heard, RRR. No JVD, murmurs, gallops, clicks or pedal edema Gastrointestinal system: Abdomen is nondistended, soft and nontender. Normal bowel sounds heard. Central nervous system: Alert and oriented. No focal neurological deficits. Extremities: Symmetric 5 x 5 power. Was to edema bilaterally   Data Review   Micro Results Recent Results (from the past 240 hour(s))  MRSA PCR Screening     Status: None   Collection Time: 01/01/15  3:10 PM  Result Value Ref Range Status   MRSA by PCR NEGATIVE NEGATIVE Final    Comment:        The GeneXpert MRSA Assay (FDA approved for NASAL specimens only), is one component of a comprehensive MRSA colonization surveillance program. It is not intended to diagnose MRSA infection nor to guide or monitor treatment for MRSA infections.   Urine culture     Status: None   Collection Time: 01/01/15  4:46 PM  Result Value Ref Range Status   Specimen Description URINE, CLEAN CATCH  Final   Special Requests NONE  Final   Culture MULTIPLE SPECIES PRESENT, SUGGEST RECOLLECTION  Final   Report Status 01/03/2015 FINAL  Final  Surgical pcr screen     Status: None   Collection Time: 01/05/15  5:47 AM  Result Value Ref Range Status   MRSA, PCR NEGATIVE NEGATIVE Final   Staphylococcus aureus NEGATIVE NEGATIVE Final    Comment:        The Xpert SA Assay (FDA approved for NASAL specimens in patients over 79 years of age), is one component of a comprehensive surveillance program.  Test performance has been  validated by [facility] for patients greater than or equal to 32 year old. It is not intended to diagnose infection nor to guide or monitor treatment.     Radiology Reports Dg Chest 2 View  01/01/2015  CLINICAL DATA:  Shortness of Breath EXAM: CHEST  2 VIEW COMPARISON:  December 28, 2014 chest radiograph and CT of the neck and upper chest September 21, 2014 FINDINGS: There remain small pleural effusions bilaterally with bibasilar  atelectasis. There is a drainage catheter on the left, unchanged in position. No pneumothorax. Heart is enlarged with pulmonary vascular within normal limits. The large right substernal mass, noted previously to represent marked thyroid enlargement, is again noted and stable. No adenopathy appreciable. IMPRESSION: The marked enlargement of the right lobe of the thyroid is again noted. There are small pleural effusions bilaterally with bibasilar atelectasis. In comparison with recent chest radiograph, there is slightly less atelectasis and effusion on the left and right sides. No new opacity. No change in cardiac silhouette. Electronically Signed   By: [name] III M.D.   On: 01/01/2015 12:48   Dg Chest 2 View  12/28/2014  CLINICAL DATA:  Pleural effusion. EXAM: CHEST  2 VIEW COMPARISON:  12/11/2014.  11/16/2014.  CT 09/21/2014 FINDINGS: Stable right paratracheal substernal mass consistent previously thyroid lesion. Stable cardiomegaly. No focal acute infiltrate. Scratch Stable small right pleural effusion. Stable small left pleural effusion. Right chest tube in stable position. No pneumothorax. Surgical clips noted over the right chest. IMPRESSION: 1. Right chest tube in stable position. Stable bilateral pleural effusions. 2. Scratched stable large right paratracheal thyroid mass. Electronically Signed   By: Marcello Moores  Register   On: 12/28/2014 12:32   Dg Chest 2 View  12/11/2014  CLINICAL DATA:  Followup pleural effusions. Breast carcinoma. Goiter. EXAM: CHEST  2 VIEW  COMPARISON:  11/16/2014 and previous CT on 09/19/2014 FINDINGS: Right chest tube remains in place. Small right pleural effusion shows mild decrease in size since previous study. No pneumothorax visualized. Small left pleural effusion remains stable. Heart size remains stable. Large right mediastinal mass is unchanged and consistent with substernal goiter shown on recent CT. IMPRESSION: Mild decrease in right pleural effusion. No pneumothorax visualized. Stable small left pleural effusion. Stable mediastinal mass, consistent with substernal goiter showed on recent CT. Electronically Signed   By: Earle Gell M.D.   On: 12/11/2014 14:38   Dg Chest Left Decubitus  01/03/2015  CLINICAL DATA:  Pleural effusion. EXAM: CHEST - LEFT DECUBITUS COMPARISON:  January 01, 2015. FINDINGS: Small free flowing left pleural effusion is noted. Stable large amount of right thyroid lobe. Pleural drainage catheter is noted on the right. IMPRESSION: Small free flowing left pleural effusion is noted. Electronically Signed   By: Marijo Conception, M.D.   On: 01/03/2015 15:28   US Renal  01/01/2015  CLINICAL DATA:  Acute renal failure.  Diabetes. EXAM: RENAL / URINARY TRACT ULTRASOUND COMPLETE COMPARISON:  None. FINDINGS: Right Kidney: Length: 9.1 cm.  Diffusely echogenic.  No hydronephrosis. Left Kidney: Length: 9.4 cm. Diffusely echogenic. No hydronephrosis. Several tiny cysts. Bladder: Appears normal for degree of bladder distention. A small amount of free peritoneal fluid is noted. IMPRESSION: 1. Diffusely echogenic kidneys, compatible with medical renal disease. 2. No hydronephrosis. 3. Small amount of ascites. Electronically Signed   By: Claudie Revering M.D.   On: 01/01/2015 16:25   Ct Biopsy  01/04/2015  CLINICAL DATA:  81 year old female with a history of monoclonal gammopathy. Presents for bone marrow biopsy EXAM: CT-GUIDED BIOPSY bone marrow MEDICATIONS AND MEDICAL HISTORY: Versed 0.5 mg, Fentanyl 25 mcg. Additional  Medications: None. ANESTHESIA/SEDATION: Moderate sedation time: 15 minutes PROCEDURE: The procedure risks, benefits, and alternatives were explained to the patient. Questions regarding the procedure were encouraged and answered. The patient understands and consents to the procedure. Scout CT of the pelvis was performed for surgical planning purposes. The posterior pelvis was prepped with Betadinein a sterile fashion, and a sterile drape  was applied covering the operative field. A sterile gown and sterile gloves were used for the procedure. Local anesthesia was provided with 1% Lidocaine. We targeted the right posterior iliac bone for biopsy. The skin and subcutaneous tissues were infiltrated with 1% lidocaine without epinephrine. A small stab incision was made with an 11 blade scalpel, and an 11 gauge Murphy needle was advanced with CT guidance to the posterior cortex. Manual forced was used to advance the needle through the posterior cortex and the stylet was removed. A bone marrow aspirate was retrieved and passed to a cytotechnologist in the room. The Murphy needle was then advanced without the stylet for a core biopsy. The core biopsy was retrieved and also passed to a cytotechnologist. Manual pressure was used for hemostasis and a sterile dressing was placed. No complications were encountered no significant blood loss was encountered. Patient tolerated the procedure well and remained hemodynamically stable throughout. FINDINGS: Scout image demonstrates safe approach to posterior iliac bone. Images during the case demonstrate placement of 11 gauge Murphy needle COMPLICATIONS: None IMPRESSION: Status post CT-guided bone marrow biopsy, with tissue specimen sent to pathology for complete histopathologic analysis Signed, Dulcy Fanny. Earleen Newport, DO [facility] Electronically Signed   By: Corrie Mckusick D.O.   On: 01/04/2015 10:35   Dg Chest Port 1  View  01/05/2015  CLINICAL DATA:  Status post dialysis catheter insertion. EXAM: PORTABLE CHEST 1 VIEW COMPARISON:  January 04, 2015. FINDINGS: Stable cardiomediastinal silhouette. Large right superior mediastinal mass is unchanged. Interval placement of right internal jugular dialysis catheter with distal tips in the expected position of the SVC. No pneumothorax is noted. Mild bilateral pleural effusions are noted. Bibasilar opacities are noted concerning for edema or atelectasis. Surgical clips are noted in the axillary regions bilaterally. Bony thorax is unremarkable. IMPRESSION: Interval placement of right internal jugular dialysis catheter with distal tips in the expected position of the SVC. No pneumothorax is noted. Stable large right superior mediastinal mass is noted. Mild bilateral pleural effusions are noted, with bibasilar opacities concerning for edema or atelectasis. Electronically Signed   By: Marijo Conception, M.D.   On: 01/05/2015 09:24   Dg Chest Port 1 View  01/04/2015  CLINICAL DATA:  Right pneumothorax. EXAM: PORTABLE CHEST 1 VIEW COMPARISON:  Left lateral decubitus chest radiograph and bone survey 01/03/2015. Chest radiographs 01/01/2015. FINDINGS: Right superior mediastinal mass is again noted consistent with markedly enlarged thyroid. The cardiac silhouette remains mildly enlarged. Mild central pulmonary vascular congestion remains. A right-sided pleural catheter remains in place. There are persistent small bilateral pleural effusions, unchanged from 01/01/2015. Patchy bibasilar opacities also do not appear significantly changed. No pneumothorax is identified. IMPRESSION: 1. Unchanged small bilateral pleural effusions and bibasilar atelectasis. 2. No pneumothorax identified. Electronically Signed   By: Logan Bores M.D.   On: 01/04/2015 07:37   Dg Abd Portable 1v  01/06/2015  CLINICAL DATA:  Nausea/ vomiting, on dialysis EXAM: PORTABLE ABDOMEN - 1 VIEW COMPARISON:  None. FINDINGS:  Nonobstructive bowel gas pattern. Moderate colonic stool burden. Degenerative changes of the lumbar spine. Right chest drain, incompletely visualized. IMPRESSION: No evidence of bowel obstruction. Moderate colonic stool burden, raising the possibility of constipation. Electronically Signed   By: Julian Hy M.D.   On: 01/06/2015 11:11   Dg Bone Survey Met  01/03/2015  ADDENDUM REPORT: 01/03/2015 13:51 ADDENDUM: Critical Value/emergent results were discussed with the patient's provider Tocarra Gassen on 01/03/2015 at time 1:50 pm. Electronically Signed  By: Nolon Nations M.D.   On: 01/03/2015 13:51  01/03/2015  CLINICAL DATA:  History of MGUS with worsening renal function. Shortness of breath. Pain all over body. History of breast cancer. EXAM: METASTATIC BONE SURVEY COMPARISON:  07/27/2014 bone scan, PET-CT 09/18/2014, and metastatic survey 12/06/2012 FINDINGS: No lytic or blastic lesions are identified to suggest metastases or multiple myeloma deposits. Patient is a edentulous. Degenerative changes are seen in the cervical, thoracic, and lumbar spine. Significant disc height loss and sclerosis identified at L3-4. Previous ORIF of right distal tibia and fibular fractures. Small probable bone island identified within the distal third of the left radius. The left radius was not imaged previously. Superior mediastinal mass consistent with enlarged thyroid as seen on prior studies. There has been development of lucency at the right lung base. Patient has a right pleural catheter. There is small right pleural effusion. Suspect interval development of right basilar pneumothorax. Small left pleural effusion is present. There is left lower lobe infiltrate, stable in appearance. Status post bilateral mastectomy and bilateral axillary node dissection. IMPRESSION: 1. Interval development of right basilar pneumothorax. 2. Persistent small right pleural effusion. 3. Persistent left lower lobe infiltrate and  pleural effusion. 4. Large right mediastinal mass stable and consistent with enlarged thyroid. 5. No suspicious lytic or blastic lesions. Critical Value/emergent results will be telephoned to the patient's provider Jerold Yoss [facility] at the time of interpretation on date 01/03/2015 at time 1:44 pm. Electronically Signed: By: Nolon Nations M.D. On: 01/03/2015 13:44     CBC  Recent Labs Lab 01/04/15 0215 01/05/15 0545 01/07/15 0256 01/08/15 0741 01/09/15 0310  WBC 5.9 6.9 6.9 8.2 7.3  HGB 7.9* 8.2* 7.8* 8.0* 7.7*  HCT 24.9* 25.3* 24.7* 25.3* 23.9*  PLT 319 317 190 212 92*  MCV 85.9 86.3 87.3 87.8 88.2  MCH 27.2 28.0 27.6 27.8 28.4  MCHC 31.7 32.4 31.6 31.6 32.2  RDW 16.7* 16.8* 16.9* 17.3* 18.2*  LYMPHSABS 0.8  --   --   --   --   MONOABS 0.7  --   --   --   --   EOSABS 0.3  --   --   --   --   BASOSABS 0.0  --   --   --   --     Chemistries   Recent Labs Lab 01/05/15 0545 01/06/15 0156 01/07/15 0256 01/08/15 0741 01/09/15 0310  NA 136 135 136 140 138  K 4.6 4.5 4.4 4.2 4.1  CL 102 101 102 105 105  CO2 24 23 23 25 24   GLUCOSE 113* 108* 91 111* 101*  BUN 172* 115* 73* 79* 49*  CREATININE 6.19* 5.02* 4.10* 4.80* 4.07*  CALCIUM 9.0 8.5* 8.4* 8.8* 8.2*   ------------------------------------------------------------------------------------------------------------------ estimated creatinine clearance is 9.9 mL/min (by C-G formula based on Cr of 4.07). ------------------------------------------------------------------------------------------------------------------ No results for input(s): HGBA1C in the last 72 hours. ------------------------------------------------------------------------------------------------------------------ No results for input(s): CHOL, HDL, LDLCALC, TRIG, CHOLHDL, LDLDIRECT in the last 72 hours. ------------------------------------------------------------------------------------------------------------------ No results for input(s): TSH, T4TOTAL,  T3FREE, THYROIDAB in the last 72 hours.  Invalid input(s): FREET3 ------------------------------------------------------------------------------------------------------------------ No results for input(s): VITAMINB12, FOLATE, FERRITIN, TIBC, IRON, RETICCTPCT in the last 72 hours.  Coagulation profile  Recent Labs Lab 01/03/15 0341 01/04/15 0215 01/05/15 0545 01/06/15 0156 01/07/15 0256  INR 5.15* 1.91* 1.31 1.44 1.37    No results for input(s): DDIMER in the last 72 hours.  Cardiac Enzymes No results for input(s): CKMB, TROPONINI, MYOGLOBIN in the last 168 hours.  Invalid input(s): CK ------------------------------------------------------------------------------------------------------------------ Invalid input(s): POCBNP     Time Spent in minutes   40 minutes   Kabria Hetzer M.D on 01/09/2015 at 12:42 PM  Between 7am to 7pm - Pager - [phone]  After 7pm go to [email or web address] - password [facility]  [facility]   Office  [phone]

## 2015-01-09 NOTE — Progress Notes (Signed)
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 sodium chloride   Intravenous Once  . anastrozole  1 mg Oral Daily  . darbepoetin (ARANESP) injection - DIALYSIS  100 mcg Intravenous Q Mon-HD  . [START ON 01/10/2015] digoxin  0.125 mg Oral Once per day on Mon Tue Wed Thu Fri  . feeding supplement (NEPRO CARB STEADY)  237 mL Oral TID WC  . heparin subcutaneous  5,000 Units Subcutaneous 3 times per day  . insulin aspart  0-9 Units Subcutaneous TID WC  . methimazole  20 mg Oral Daily  . midodrine  10 mg Oral TID WC  . multivitamin  1 tablet Oral QHS  . senna-docusate  1 tablet Oral BID  . sodium chloride  3 mL Intravenous Q12H  . thiamine  100 mg Oral Daily  . warfarin  5 mg Oral ONCE-1800  . Warfarin - Pharmacist Dosing Inpatient   Does not apply q1800   Continuous Infusions:  PRN Meds:.sodium chloride, acetaminophen, albuterol, metoCLOPramide (REGLAN) injection, ondansetron (ZOFRAN) IV, oxyCODONE-acetaminophen, sodium chloride  Current Labs: reviewed    Physical Exam:  Blood pressure 90/36, pulse 62, temperature 97.8 F (36.6 C), temperature source Oral, resp. rate 17, height '5\' 5"'$  (1.651 m), weight 58 kg (127 lb 13.9 oz), SpO2 98 %. NAD RRR TDC in place CTAB   A/P 1. Progressive CKD now ESRD 1. Likely multifactorial: DN, Age, and ?monoclonal renal issue 2. Keep on MWF schedule now 3. If DC this week can go THS to AF [place] 4. VVS following for permanent access,  can address as outpt based on BPs 2. MGUS and/or MM 1. BM Bx reviewed 2. Hematology note reviewed 3. Discussed utlility of renal bx with hematology will move forward without currently 3. 2HPTH: no indicatio nfor VDRA or cinacalcet 4. Hyperphosphatemia currently at goal 5. Chronic Hypotension / Volume 1. On midodrine 2. Will set EDW upon discharge 6. Anemia: on ESA and IV Fe -- TRH plan for transfusion with HD in AM  Pearson Grippe MD 01/09/2015, 4:32 PM   Recent Labs Lab 01/07/15 0256 01/08/15 0741 01/09/15 0310  NA 136 140 138  K 4.4 4.2 4.1  CL 102 105 105  CO2 '23 25 24  '$ GLUCOSE 91 111* 101*  BUN 73* 79* 49*  CREATININE 4.10* 4.80* 4.07*  CALCIUM 8.4* 8.8* 8.2*  PHOS 6.3* 6.3* 4.8*    Recent Labs Lab 01/04/15 0215  01/07/15 0256 01/08/15 0741 01/09/15 0310  WBC 5.9  < > 6.9 8.2 7.3  NEUTROABS 4.1  --   --   --   --   HGB 7.9*  < > 7.8* 8.0* 7.7*  HCT 24.9*  < > 24.7* 25.3* 23.9*  MCV 85.9  < > 87.3 87.8 88.2  PLT 319  < > 190 212 92*  < > = values in this interval not displayed.

## 2015-01-09 NOTE — Consult Note (Signed)
   [facility] CM Inpatient Consult   01/09/2015  Alison Gross [birth year] [id number] Patient is currently active [up to admission] with [facility] for chronic disease management services as a benefit of her [employer] insurance.  Patient has been engaged by a [employer].  Will make Inpatient Case Manager aware that [facility] following. Of note, [facility] Care Management services does not replace or interfere with any services that are arranged by inpatient case management or social work.  For additional questions or referrals please contact: Natividad Brood, RN BSN [facility] Liaison  [phone] business mobile phone

## 2015-01-09 NOTE — Progress Notes (Signed)
Alison Gross   DOB:10/13/1932   JG#:[id number]   MLY#:[id number]  Subjective: Alison Gross is feeling better. She tells me she is ambulating "some" on the floor. She denies SOB and says her palpitations have stopped. Constipation improved. No family in room   Objective: elderly African American woman examined in bed Filed Vitals:   01/09/15 0300  BP: 97/56  Pulse:   Temp: 97.4 F (36.3 C)  Resp: 14    Body mass index is 21.28 kg/(m^2).  Intake/Output Summary (Last 24 hours) at 01/09/15 0642 Last data filed at 01/08/15 1900  Gross per 24 hour  Intake   2680 ml  Output   1497 ml  Net   1183 ml     Sclerae unicteric  No peripheral adenopathy  Lungs clear -- auscultated anterolaterally  Heart regular rate and rhythm  Abdomen soft, +BS  Neuro nonfocal  Breast exam: s/p bilateral mastectomies  CBG (last 3)   Recent Labs  01/08/15 1138 01/08/15 1556 01/09/15 0002  GLUCAP 92 163* 122*     Labs:  Lab Results  Component Value Date   WBC 7.3 01/09/2015   HGB 7.7* 01/09/2015   HCT 23.9* 01/09/2015   MCV 88.2 01/09/2015   PLT 92* 01/09/2015   NEUTROABS 4.1 01/04/2015    @LASTCHEMISTRY @  Urine Studies No results for input(s): UHGB, CRYS in the last 72 hours.  Invalid input(s): UACOL, UAPR, USPG, UPH, UTP, UGL, UKET, UBIL, UNIT, UROB, [place], UEPI, UWBC, Duwayne Heck [place], [place]  Basic Metabolic Panel:  Recent Labs Lab 01/05/15 0545 01/06/15 0156 01/07/15 0256 01/08/15 0741 01/09/15 0310  NA 136 135 136 140 138  K 4.6 4.5 4.4 4.2 4.1  CL 102 101 102 105 105  CO2 24 23 23 25 24   GLUCOSE 113* 108* 91 111* 101*  BUN 172* 115* 73* 79* 49*  CREATININE 6.19* 5.02* 4.10* 4.80* 4.07*  CALCIUM 9.0 8.5* 8.4* 8.8* 8.2*  PHOS 7.4* 6.4* 6.3* 6.3* 4.8*   GFR Estimated Creatinine Clearance: 9.9 mL/min (by C-G formula based on Cr of 4.07). Liver Function Tests:  Recent Labs Lab 01/05/15 0545 01/06/15 0156 01/07/15 0256 01/08/15 0741 01/09/15 0310  ALBUMIN 2.3*  2.1* 2.2* 2.1* 2.1*   No results for input(s): LIPASE, AMYLASE in the last 168 hours. No results for input(s): AMMONIA in the last 168 hours. Coagulation profile  Recent Labs Lab 01/03/15 0341 01/04/15 0215 01/05/15 0545 01/06/15 0156 01/07/15 0256  INR 5.15* 1.91* 1.31 1.44 1.37    CBC:  Recent Labs Lab 01/04/15 0215 01/05/15 0545 01/07/15 0256 01/08/15 0741 01/09/15 0310  WBC 5.9 6.9 6.9 8.2 7.3  NEUTROABS 4.1  --   --   --   --   HGB 7.9* 8.2* 7.8* 8.0* 7.7*  HCT 24.9* 25.3* 24.7* 25.3* 23.9*  MCV 85.9 86.3 87.3 87.8 88.2  PLT 319 317 190 212 92*   Cardiac Enzymes: No results for input(s): CKTOTAL, CKMB, CKMBINDEX, TROPONINI in the last 168 hours. BNP: Invalid input(s): POCBNP CBG:  Recent Labs Lab 01/07/15 0807 01/07/15 1229 01/08/15 1138 01/08/15 1556 01/09/15 0002  GLUCAP 81 110* 92 163* 122*   D-Dimer No results for input(s): DDIMER in the last 72 hours. Hgb A1c No results for input(s): HGBA1C in the last 72 hours. Lipid Profile No results for input(s): CHOL, HDL, LDLCALC, TRIG, CHOLHDL, LDLDIRECT in the last 72 hours. Thyroid function studies No results for input(s): TSH, T4TOTAL, T3FREE, THYROIDAB in the last 72 hours.  Invalid input(s): FREET3 Anemia work  up No results for input(s): VITAMINB12, FOLATE, FERRITIN, TIBC, IRON, RETICCTPCT in the last 72 hours. Microbiology Recent Results (from the past 240 hour(s))  MRSA PCR Screening     Status: None   Collection Time: 01/01/15  3:10 PM  Result Value Ref Range Status   MRSA by PCR NEGATIVE NEGATIVE Final    Comment:        The GeneXpert MRSA Assay (FDA approved for NASAL specimens only), is one component of a comprehensive MRSA colonization surveillance program. It is not intended to diagnose MRSA infection nor to guide or monitor treatment for MRSA infections.   Urine culture     Status: None   Collection Time: 01/01/15  4:46 PM  Result Value Ref Range Status   Specimen  Description URINE, CLEAN CATCH  Final   Special Requests NONE  Final   Culture MULTIPLE SPECIES PRESENT, SUGGEST RECOLLECTION  Final   Report Status 01/03/2015 FINAL  Final  Surgical pcr screen     Status: None   Collection Time: January 23, 2015  5:47 AM  Result Value Ref Range Status   MRSA, PCR NEGATIVE NEGATIVE Final   Staphylococcus aureus NEGATIVE NEGATIVE Final    Comment:        The Xpert SA Assay (FDA approved for NASAL specimens in patients over 67 years of age), is one component of a comprehensive surveillance program.  Test performance has been validated by [facility] for patients greater than or equal to 49 year old. It is not intended to diagnose infection nor to guide or monitor treatment.       Studies:  patient: Alison Gross, Alison Gross Collected: 01/04/2015 Client: [place] Accession: [id number] Received: 01/04/2015 Corrie Mckusick, DO DOB: 12-Jul-1932 Age: 81 Gender: F Reported: 01/08/2015 [address] Patient Ph: [phone] MRN #: [id number] [place], [place] [address] Visit #: [id number] Chart #: Phone:  Fax: CC: Phillips Climes, MD Lurline Del, MD BONE MARROW REPORT FINAL DIAGNOSIS Diagnosis Bone Marrow, Aspirate,Biopsy, and Clot - VARIABLY CELLULAR BONE MARROW WITH PLASMACYTOSIS (PLASMA CELLS 14%). - TRILINEAGE HEMATOPOIESIS. - SEE COMMENT. PERIPHERAL BLOOD: - NORMOCYTIC-NORMOCHROMIC ANEMIA. Diagnosis Note The bone marrow is variably cellular with increased number of plasma cells representing 14% of all cells in the aspirate. Immunohistochemical stains show that the plasma cells display lambda light chain excess/restriction. In the presence of a monoclonal protein, the findings are consistent with plasma cell dyscrasia/neoplasm. The background shows trilineage hematopoiesis with variable distribution including areas of dropout associated with scattering of large atypical megakaryocytes. The overall myeloid appearance is nonspecific  and may be related to previous treatment and/or renal dysfunction. There is no evidence of metastatic carcinoma. Correlation with cytogenetic studies is recommended. (BNS:ecj 01-23-15)  Assessment: 81 y.o. [place] woman   (1) status post bilateral mastectomies 05/10/2012, showing (a) on the left side, low-grade ductal carcinoma in situ; earlier biopsy had shown multiple areas of concern, one of which was invasive ductal carcinoma, grade 1, with abundant mucin, estrogen receptor positive, progesterone receptor negative, with an MIB-1 of 10, and no HER-2 amplification.  (b) on the right side, a pT2 pN1, stage IIB, invasive mucinous/ductal carcinoma, grade 1, estrogen and progesterone receptor positive, HER-2 negative, with an MIB-1 of 32%.  (2) opted against adjuvant chemotherapy  (3) adjuvant radiation, completed 08/26/2012  (4) started tamoxifen in late June 2014, discontinued due to coagulation concerns; started anastrozole 11/29/2013  (a) bone density 09/04/2013 showed osteoporosis (b) received zolendronate 02/20/2014  (5) no reconstruction planned  (6) labs have shown a  low, stable M-Spike and free lambda light chains, c/w M-GUS  (a) negative bone survey 12/06/2012, again negative 01/03/2015  (b) bone marrow biopsy 01/04/2015 shows 14% plasma cells  (c) rise in free lambda light chains in setting of ESRD, with stable M-spike OCT 2016  (7) ESRD: started HD M/W/F October 2016  (a) anemia of renal failure, on iron and epo supplementation as of 01/02/2015    Plan:  Alison Gross is tolerating HD well so far and she is encouraged re continuing it. Awaiting definitive access.  She has already startedt iron and epo supplementation under renal, to be continued as outpatient.  I reassured her that we are not seeing any evidence of active breast cancer. She is continuing on anastrozole which should not interfere with any of her other  medicines and does not affect clotting parameters.  We discussed her paraprotein which I do not believe is driving the ESRD. Her condition is somewhere between M-GUS (the marrow plamacytosis is >10%) and "smoldering myeloma" (the M-spike is <3 g). This requires only follow-up. We will repeat parameters esp. light chains when she returns to see me as already scheduled Dec 6.  Please let me know if I can be of further help. Will sign off at this time  Chauncey Cruel, MD 01/09/2015  6:42 AM Medical Oncology and Hematology [facility] [address] [place], [place] [address] Tel. [phone]    Fax. [phone]

## 2015-01-09 NOTE — Discharge Summary (Addendum)
Alison Gross, is a 81 y.o. female  DOB 1933/03/09  MRN [id number].  Admission date:  01/01/2015  Admitting Physician  Debbe Odea, MD  Discharge Date:  01/10/2015   Primary MD  Maximino Greenland, MD  Recommendations for primary care physician for things to follow:  - Continue hemodialysis on Monday, Wednesday and Friday; this has been arranged by nephrology - Continue to monitor your INR level and adjust warfarin dose as done preoperatively to admission. - Patient needs to follow with endocrinology as an outpatient diagnosis of hyperthyroidism, discussed with Dr. Buddy Duty who will arrange for outpatient appointment.  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 Hypercholesterolemia     takes Crestor daily  . Thyroid disease     had iodine radiation  . Hypertension     takes Hyzaar daily  . Dysrhythmia     takes Carvedilol daily  . Pneumonia     history of;last time about 4-52yr ago  . GERD (gastroesophageal reflux disease)     takes Protonix daily  . Gastric ulcer   . History of blood transfusion   . History of colon polyps   . Anemia     takes iron pill daily  . Diabetes mellitus without  complication ([place]     takes Tradjenta daily  . History of radiation therapy 07/12/12-08/26/12    right breast/  . Paroxysmal atrial fibrillation ([facility]     PCP EKG 09/27/2013: A. Fibrillation.. EKG 09/29/2013: S. Tach.  . Breast cancer ([place] 04/12/12    right-pos lymph node/left-DCIS  . Shortness of breath on exertion   . Bruit of left carotid artery   . Acute upper GI bleeding 01/24/2012  . MGUS (monoclonal gammopathy of unknown significance) 04/21/2013  . Osteoporosis 11/29/2013  . Recurrent right pleural effusion 07/26/2014  . Stage III chronic kidney disease 07/26/2014  . Goiter 07/27/2014  . Substernal goiter 12/23/2012  . Pleural effusion, right 12/23/2012  . Type 2 diabetes mellitus with renal manifestations ([facility] [place] 07/26/2014  . Dysphonia 09/20/2014  . Vocal cord paralysis     Suspected due to massive substernal goiter  . CHF (congestive heart failure) ([facility]     Past Surgical History  Procedure Laterality Date  . Carpal tunnel release Left   .  Breast biopsy    . Esophagogastroduodenoscopy  01/24/2012    Procedure: ESOPHAGOGASTRODUODENOSCOPY (EGD);  Surgeon: Jeryl Columbia, MD;  Location[facility];  Service: Endoscopy;  Laterality: N/A;  . Thyroid surgery      "removed goiter"  . Cystectomy      from back of neck  . Knee surgery      right with rods  . Esophagogastroduodenoscopy    . Colonoscopy    . Total mastectomy Bilateral 05/10/2012    Procedure: RIGHT modified mastectomy; LEFT total mastectomy;  Surgeon: [name], MD;  Location: [place];  Service: General;  Laterality: Bilateral;  . Axillary sentinel node biopsy Right 05/10/2012    Procedure: AXILLARY SENTINEL lymph  NODE BIOPSY;  Surgeon: [name], MD;  Location: [place];  Service: General;  Laterality: Right;  nuclear medicine injection right side  7:00 am   . Abdominal hysterectomy      fibroids, with bilateral SO  . Chest tube insertion Right 09/27/2014    Procedure: INSERTION OF RIGHT PLEURAL DRAINAGE  CATHETER;  Surgeon: [name], MD;  Location: [place];  Service: Thoracic;  Laterality: Right;  . Insertion of dialysis catheter N/A 01/05/2015    Procedure: INSERTION OF DIALYSIS CATHETER;  Surgeon: Angelia Mould, MD;  Location: [facility];  Service: Vascular;  Laterality: N/A;   HPI  from the history and physical done on the day of admission Alison Gross is a 81 y.o. female, with PAfib, CHF, MGUS, BRCA, Recurrent exudative pleural effusion s/p pleurx cath, and CKD stage 4 - who presents to the ED for worsening DOE and weakness. Per the patient she is normally able to ambulate to the bathroom and back without being winded. Over the last few days she is unable to take more than 2 or 3 steps before she becomes short of breath. She has also had worsening bilateral lower extremity edema. The patient denies fever, sore throat, chills. She reports having palpitations when she attempts to exert herself. She saw Dr.Ganji on Wednesday 10/19 and he changed her lasix to Demadex in order to remedy the lower extremity edema. Today in the emergency department her creatinine has increased from a baseline of 2.5 to 6.9. BNP is elevated at 2364, unfortunately her blood pressure is low with a systolic in the 24Q to 68T. INR is elevated at 5.8. She has an ongoing elevated troponin that in the past has been attributed to diastolic heart failure. TR[facility] will admit for acute on chronic renal failure, acute on chronic diastolic heart failure, and hypotension.  Alison Gross was admitted on 11/07/2014 by Dr. Einar Gip with a similar picture - acutely worsening shortness of breath with declining renal function. The declining renal function was felt to be due to recurrent pleural effusion and continued pleural drain (between 600-700 mL daily). She underwent talc pleurodesis but continued to have pleural drain. She developed DVTs in her right axillary and subclavian veins and was placed on Coumadin. Palliative care  consultation was held and the patient was made DO NOT RESUSCITATE. Unfortunately during the hospitalization her DO NOT RESUSCITATE was reversed and she is now a full code.  Hospital Course  81 year old female with history of HTN, DM, chronic diastolic CHF, breast cancer status post bilateral mastectomies, PAF, chronic kidney disease stage 4(baseline creatinine~2), recurrent exudative right pleural effusion s/p right Pleurx catheter, right axillary/subclavian DVT, M spike presented to [facility] ED on 01/01/15 with worsening dyspnea, weight gain, weakness and worsening renal functions. She was  seen by her cardiologist on 10/19 and Lasix was changed to Demadex. In the ED, creatinine up from 2.5-6.9, BNP 2364, hypotensive range blood pressures in the 80s-90s, elevated troponin and supratherapeutic INR 5.8. She was hospitalized for management of acute on chronic renal failure, acute on chronic diastolic CHF and hypotension. Advanced CHF team and nephrology are consulting. INR supratherapeutic on admission, dictated with vitamin K for permacath and bone marrow biopsy, Patient had permacath inserted 10/28 , and was started on hemodialysis 10/28, warfarin has been held , patient was on heparin drip for anticoagulation pending graft placement , and given persistently low blood pressure this can be done electively as an outpatient , so patient resumed back on warfarin 11/1 .  Acute on stage IV chronic kidney disease, now end-stage renal disease and hemodialysis dependent. - Baseline creatinine ~2, Admitted with creatinine of 6.9. - Most likely related to Cardiorenal syndrome and acute diastolic dysfunction, less likely a due to MGUS(increased lambda chain may be related to her renal failure not causing it as per hematology). - renal ultrasound-no hydronephrosis ,Diffusely echogenic kidneys, compatible with medical renal disease - Hematology consult appreciated, patient went for bone marrow biopsy 10/27 to evaluate for  possible myeloma., Results pending - nephrology consult appreciated Started hemodialysis on 10/28 after permacath insertion on 10/28,  - [facility] following for access, currently using permacath, unable to place graft during hospital stay having had persistent hypotension and risk for graft thrombosis , can be done electively as an outpatient as discussed with vascular surgery Dr. Bridgett Larsson , so resumed back on warfarin. - Continue with hemodialysis on TTS schedule.  Acute on chronic diastolic CHF - Patient is significantly volume overloaded on admission in the setting of worsening renal function. - Repeat echo remains with preserved LV function -Volume status improving with dialysisg with dialysis  Recurrent right pleural effusion, s/p Pleurx catheter - cytology from her Right effusion has been negative x4, has being drained daily during hospital stay, output is decreasing daily most likely related to improving volume status with hemodialysis.  Paroxysmal atrial fibrillation - Heart rate controlled, Patient initially on amiodarone, which has been stopped giving her hyperthyroidism, be started on digoxin 0.125 mg under through Friday for heart rate control, discussed with Dr. Einar Gip. - INR supratherapeutic initially, corrected with vitamin K for need of surgical procedures include permacath insertion and bone marrow biopsy. - Was on heparin drip,  resumed on warfarin. Her discharge.   History of breast cancer status post bilateral mastectomies 2014 - Oncology input appreciated. In remission.  MGUS - Oncology input appreciated. Bone marrow biopsy with plasmacytosis 14%, as per oncology "Her condition is somewhere between M-GUS (the marrow plamacytosis is >10%) and "smoldering myeloma" (the M-spike is <3 g), This requires only follow-up"  Iron deficiency anemia - on Aranesp & IV Feraheme - Albumin is 7.7 01/09/2015, has used 2 units PRBC on 11/2 during hemodialysis. - Resume [place] supplement on  discharge  Hypotension - Holding blood pressure medications on discharge. Started on midodrine, colic blood pressure usually run in the low to mid 80s, symptomatic  Uncontrolled DM 2 with renal complications - Patient was on insulin sliding scale during hospital stay, CBGs has been consistently controlled with almost no insulin requirement, hold oral hypoglycemic agent on discharge to avoid hypoglycemia.  Supratherapeutic INR - Admitted with INR of 5.8. Likely elevated from acute illness. Improved with vitamin K ,  - resumed on warfarin   Goiter - Abnormal TSH <0.010. May be related to amiodarone.  free T4 is elevated at 3.48 , free T 3 wnl at 2.3, this picture is compatible with hyperthyroidism, discussed with Dr. Buddy Duty, will follow with her as an outpatient(his office will call her with an appointment),  started on low-dose methimazole 20 mg oral daily  - Amiodarone has been stopped.  Nausea and vomiting - no acute finding on chest x-ray - Resolved  Severe protein calorie malnutrition - Started on Nepro  Constipation - started on stool softener  Discharge Condition:  Stable   Follow UP  Follow-up Information    Follow up with SANDERS,ROBYN N, MD. Schedule an appointment as soon as possible for a visit in 1 week.   Specialty:  Internal Medicine   Contact information:   [address] [place] [address] [phone]       Follow up with Chauncey Cruel, MD.   Specialty:  Oncology   Why:  keep your appointment   Contact information:   [place] [address] [phone]       Follow up with KERR,JEFFREY, MD.   Specialty:  Endocrinology   Why:  will be called with appointment   Contact information:   [address] [place] [address] [phone]       Follow up with Adrian Prows, MD In 2 weeks.   Specialty:  Cardiology   Contact information:   [address] [place]  [address] [phone]         Discharge Instructions  and  Discharge Medications         Discharge Instructions    Discharge instructions    Complete by:  As directed   Follow with Primary MD Maximino Greenland, MD in 10 days   Get CBC, CMP, INR   checked  by Primary MD next visit.   Continue to drain Pleurex cath home as prior to admission.  Activity: As tolerated with Full fall precautions use walker/cane & assistance as needed   Disposition Home with home health services [facility] and HHPT)   For Heart failure patients - Check your Weight same time everyday, if you gain over 2 pounds, or you develop in leg swelling, experience more shortness of breath or chest pain, call your Primary MD immediately. Follow Cardiac Low Salt Diet less than 2 --2.5 gram of sodium.   On your next visit with your primary care physician please Get Medicines reviewed and adjusted.   Please request your Prim.MD to go over all Hospital Tests and Procedure/Radiological results at the follow up, please get all Hospital records sent to your Prim MD by signing hospital release before you go home.   If you experience worsening of your admission symptoms, develop shortness of breath, life threatening emergency, suicidal or homicidal thoughts you must seek medical attention immediately by calling 911 or calling your MD immediately  if symptoms less severe.  You Must read complete instructions/literature along with all the possible adverse reactions/side effects for all the Medicines you take and that have been prescribed to you. Take any new Medicines after you have completely understood and accpet all the possible adverse reactions/side effects.   Do not drive, operating heavy machinery, perform activities at heights, swimming or participation in water activities or provide baby sitting services if your were admitted for syncope or siezures until you have seen by Primary MD or a Neurologist and advised to do so  again.  Do not drive when taking Pain medications.  Do not take more than prescribed Pain, Sleep and Anxiety Medications  Special Instructions: If you have smoked or chewed Tobacco  in the last 2 yrs please stop smoking, stop any regular Alcohol  and or any Recreational drug use.  Wear Seat belts while driving.   Please note  You were cared for by a hospitalist during your hospital stay. If you have any questions about your discharge medications or the care you received while you were in the hospital after you are discharged, you can call the unit and asked to speak with the hospitalist on call if the hospitalist that took care of you is not available. Once you are discharged, your primary care physician will handle any further medical issues. Please note that NO REFILLS for any discharge medications will be authorized once you are discharged, as it is imperative that you return to your primary care physician (or establish a relationship with a primary care physician if you do not have one) for your aftercare needs so that they can reassess your need for medications and monitor your lab values.     Increase activity slowly    Complete by:  As directed             Medication List    STOP taking these medications        amiodarone 200 MG tablet  Commonly known as:  PACERONE     furosemide 40 MG tablet  Commonly known as:  LASIX     metoprolol tartrate 25 MG tablet  Commonly known as:  LOPRESSOR     torsemide 20 MG tablet  Commonly known as:  DEMADEX     TRADJENTA 5 MG Tabs tablet  Generic drug:  linagliptin     Vitamin D 2000 UNITS Caps      TAKE these medications        acetaminophen 325 MG tablet  Commonly known as:  TYLENOL  Take 2 tablets (650 mg total) by mouth every 4 (four) hours as needed for headache or mild pain.     anastrozole 1 MG tablet  Commonly known as:  ARIMIDEX  TAKE 1 TABLET (1 MG TOTAL) BY MOUTH DAILY.     digoxin 0.125 MG tablet  Commonly  known as:  LANOXIN  Please Take one tablet oral daily from Monday to Friday, not on Saturday or Sunday.     feeding supplement (NEPRO CARB STEADY) Liqd  Take 237 mLs by mouth 3 (three) times daily with meals.     ferrous sulfate 325 (65 FE) MG tablet  Take 1 tablet (325 mg total) by mouth 3 (three) times daily with meals.     methimazole 10 MG tablet  Commonly known as:  TAPAZOLE  Take 2 tablets (20 mg total) by mouth daily.     midodrine 10 MG tablet  Commonly known as:  PROAMATINE  Take 1 tablet (10 mg total) by mouth 3 (three) times daily with meals.     multivitamin Tabs tablet  Take 1 tablet by mouth at bedtime.     multivitamin with minerals Tabs tablet  Take 1 tablet by mouth every other day.     senna-docusate 8.6-50 MG tablet  Commonly known as:  Senokot-S  Take 1 tablet by mouth 2 (two) times daily.     thiamine 100 MG tablet  Take 1 tablet (100 mg total) by mouth daily.     VENTOLIN HFA 108 (90 BASE) MCG/ACT inhaler  Generic drug:  albuterol  Inhale  2 puffs into the lungs every 4 (four) hours as needed for wheezing or shortness of breath.     warfarin 2 MG tablet  Commonly known as:  COUMADIN  Take 1 tablet (2 mg total) by mouth daily at 6 PM. Take 63m on 11/2 and 11/3; then resume prior home dose of 230mdaily       Diet and Activity recommendation: build up activity as tolerated and follow heart healthy/renal diet.  Consults obtained -  Cardiology CHF Nephrology Hematology Vascular surgery Palliative care  Major procedures and Radiology Reports - PLEASE review detailed and final reports for all details, in brief -  - Bone marrow biopsy by IR 10/27 - Patient has a right Pleurx catheter from prior to admission, which is drained daily. - Permacath insertion on 10/28   Dg Chest 2 View  01/01/2015  CLINICAL DATA:  Shortness of Breath EXAM: CHEST  2 VIEW COMPARISON:  December 28, 2014 chest radiograph and CT of the neck and upper chest September 21, 2014  FINDINGS: There remain small pleural effusions bilaterally with bibasilar atelectasis. There is a drainage catheter on the left, unchanged in position. No pneumothorax. Heart is enlarged with pulmonary vascular within normal limits. The large right substernal mass, noted previously to represent marked thyroid enlargement, is again noted and stable. No adenopathy appreciable. IMPRESSION: The marked enlargement of the right lobe of the thyroid is again noted. There are small pleural effusions bilaterally with bibasilar atelectasis. In comparison with recent chest radiograph, there is slightly less atelectasis and effusion on the left and right sides. No new opacity. No change in cardiac silhouette. Electronically Signed   By: WiLowella GripII M.D.   On: 01/01/2015 12:48   Dg Chest 2 View  12/28/2014  CLINICAL DATA:  Pleural effusion. EXAM: CHEST  2 VIEW COMPARISON:  12/11/2014.  11/16/2014.  CT 09/21/2014 FINDINGS: Stable right paratracheal substernal mass consistent previously thyroid lesion. Stable cardiomegaly. No focal acute infiltrate. Scratch Stable small right pleural effusion. Stable small left pleural effusion. Right chest tube in stable position. No pneumothorax. Surgical clips noted over the right chest. IMPRESSION: 1. Right chest tube in stable position. Stable bilateral pleural effusions. 2. Scratched stable large right paratracheal thyroid mass. Electronically Signed   By: ThMarcello MooresRegister   On: 12/28/2014 12:32   Dg Chest 2 View  12/11/2014  CLINICAL DATA:  Followup pleural effusions. Breast carcinoma. Goiter. EXAM: CHEST  2 VIEW COMPARISON:  11/16/2014 and previous CT on 09/19/2014 FINDINGS: Right chest tube remains in place. Small right pleural effusion shows mild decrease in size since previous study. No pneumothorax visualized. Small left pleural effusion remains stable. Heart size remains stable. Large right mediastinal mass is unchanged and consistent with substernal goiter shown on  recent CT. IMPRESSION: Mild decrease in right pleural effusion. No pneumothorax visualized. Stable small left pleural effusion. Stable mediastinal mass, consistent with substernal goiter showed on recent CT. Electronically Signed   By: JoEarle Gell.D.   On: 12/11/2014 14:38   Dg Chest Left Decubitus  01/03/2015  CLINICAL DATA:  Pleural effusion. EXAM: CHEST - LEFT DECUBITUS COMPARISON:  January 01, 2015. FINDINGS: Small free flowing left pleural effusion is noted. Stable large amount of right thyroid lobe. Pleural drainage catheter is noted on the right. IMPRESSION: Small free flowing left pleural effusion is noted. Electronically Signed   By: JaMarijo ConceptionM.D.   On: 01/03/2015 15:28   [place]  01/01/2015  CLINICAL DATA:  Acute renal failure.  Diabetes. EXAM: RENAL / URINARY TRACT ULTRASOUND COMPLETE COMPARISON:  None. FINDINGS: Right Kidney: Length: 9.1 cm.  Diffusely echogenic.  No hydronephrosis. Left Kidney: Length: 9.4 cm. Diffusely echogenic. No hydronephrosis. Several tiny cysts. Bladder: Appears normal for degree of bladder distention. A small amount of free peritoneal fluid is noted. IMPRESSION: 1. Diffusely echogenic kidneys, compatible with medical renal disease. 2. No hydronephrosis. 3. Small amount of ascites. Electronically Signed   By: Claudie Revering M.D.   On: 01/01/2015 16:25   Ct Biopsy  01/04/2015  CLINICAL DATA:  81 year old female with a history of monoclonal gammopathy. Presents for bone marrow biopsy EXAM: CT-GUIDED BIOPSY bone marrow MEDICATIONS AND MEDICAL HISTORY: Versed 0.5 mg, Fentanyl 25 mcg. Additional Medications: None. ANESTHESIA/SEDATION: Moderate sedation time: 15 minutes PROCEDURE: The procedure risks, benefits, and alternatives were explained to the patient. Questions regarding the procedure were encouraged and answered. The patient understands and consents to the procedure. Scout CT of the pelvis was performed for surgical planning purposes. The posterior pelvis  was prepped with Betadinein a sterile fashion, and a sterile drape was applied covering the operative field. A sterile gown and sterile gloves were used for the procedure. Local anesthesia was provided with 1% Lidocaine. We targeted the right posterior iliac bone for biopsy. The skin and subcutaneous tissues were infiltrated with 1% lidocaine without epinephrine. A small stab incision was made with an 11 blade scalpel, and an 11 gauge Murphy needle was advanced with CT guidance to the posterior cortex. Manual forced was used to advance the needle through the posterior cortex and the stylet was removed. A bone marrow aspirate was retrieved and passed to a cytotechnologist in the room. The Murphy needle was then advanced without the stylet for a core biopsy. The core biopsy was retrieved and also passed to a cytotechnologist. Manual pressure was used for hemostasis and a sterile dressing was placed. No complications were encountered no significant blood loss was encountered. Patient tolerated the procedure well and remained hemodynamically stable throughout. FINDINGS: Scout image demonstrates safe approach to posterior iliac bone. Images during the case demonstrate placement of 11 gauge Murphy needle COMPLICATIONS: None IMPRESSION: Status post CT-guided bone marrow biopsy, with tissue specimen sent to pathology for complete histopathologic analysis Signed, Dulcy Fanny. Earleen Newport, DO [facility] Electronically Signed   By: Corrie Mckusick D.O.   On: 01/04/2015 10:35   Dg Chest Port 1 View  01/05/2015  CLINICAL DATA:  Status post dialysis catheter insertion. EXAM: PORTABLE CHEST 1 VIEW COMPARISON:  January 04, 2015. FINDINGS: Stable cardiomediastinal silhouette. Large right superior mediastinal mass is unchanged. Interval placement of right internal jugular dialysis catheter with distal tips in the expected position of the SVC. No pneumothorax is noted. Mild  bilateral pleural effusions are noted. Bibasilar opacities are noted concerning for edema or atelectasis. Surgical clips are noted in the axillary regions bilaterally. Bony thorax is unremarkable. IMPRESSION: Interval placement of right internal jugular dialysis catheter with distal tips in the expected position of the SVC. No pneumothorax is noted. Stable large right superior mediastinal mass is noted. Mild bilateral pleural effusions are noted, with bibasilar opacities concerning for edema or atelectasis. Electronically Signed   By: Marijo Conception, M.D.   On: 01/05/2015 09:24   Dg Chest Port 1 View  01/04/2015  CLINICAL DATA:  Right pneumothorax. EXAM: PORTABLE CHEST 1 VIEW COMPARISON:  Left lateral decubitus chest radiograph and bone survey 01/03/2015. Chest radiographs 01/01/2015. FINDINGS: Right superior mediastinal mass is again  noted consistent with markedly enlarged thyroid. The cardiac silhouette remains mildly enlarged. Mild central pulmonary vascular congestion remains. A right-sided pleural catheter remains in place. There are persistent small bilateral pleural effusions, unchanged from 01/01/2015. Patchy bibasilar opacities also do not appear significantly changed. No pneumothorax is identified. IMPRESSION: 1. Unchanged small bilateral pleural effusions and bibasilar atelectasis. 2. No pneumothorax identified. Electronically Signed   By: Logan Bores M.D.   On: 01/04/2015 07:37   Dg Abd Portable 1v  01/06/2015  CLINICAL DATA:  Nausea/ vomiting, on dialysis EXAM: PORTABLE ABDOMEN - 1 VIEW COMPARISON:  None. FINDINGS: Nonobstructive bowel gas pattern. Moderate colonic stool burden. Degenerative changes of the lumbar spine. Right chest drain, incompletely visualized. IMPRESSION: No evidence of bowel obstruction. Moderate colonic stool burden, raising the possibility of constipation. Electronically Signed   By: Julian Hy M.D.   On: 01/06/2015 11:11   Dg Bone Survey Met  01/03/2015   ADDENDUM REPORT: 01/03/2015 13:51 ADDENDUM: Critical Value/emergent results were discussed with the patient's provider DAWOOD ELGERGAWY on 01/03/2015 at time 1:50 pm. Electronically Signed   By: Nolon Nations M.D.   On: 01/03/2015 13:51  01/03/2015  CLINICAL DATA:  History of MGUS with worsening renal function. Shortness of breath. Pain all over body. History of breast cancer. EXAM: METASTATIC BONE SURVEY COMPARISON:  07/27/2014 bone scan, PET-CT 09/18/2014, and metastatic survey 12/06/2012 FINDINGS: No lytic or blastic lesions are identified to suggest metastases or multiple myeloma deposits. Patient is a edentulous. Degenerative changes are seen in the cervical, thoracic, and lumbar spine. Significant disc height loss and sclerosis identified at L3-4. Previous ORIF of right distal tibia and fibular fractures. Small probable bone island identified within the distal third of the left radius. The left radius was not imaged previously. Superior mediastinal mass consistent with enlarged thyroid as seen on prior studies. There has been development of lucency at the right lung base. Patient has a right pleural catheter. There is small right pleural effusion. Suspect interval development of right basilar pneumothorax. Small left pleural effusion is present. There is left lower lobe infiltrate, stable in appearance. Status post bilateral mastectomy and bilateral axillary node dissection. IMPRESSION: 1. Interval development of right basilar pneumothorax. 2. Persistent small right pleural effusion. 3. Persistent left lower lobe infiltrate and pleural effusion. 4. Large right mediastinal mass stable and consistent with enlarged thyroid. 5. No suspicious lytic or blastic lesions. Critical Value/emergent results will be telephoned to the patient's provider DAWOOD [facility] at the time of interpretation on date 01/03/2015 at time 1:44 pm. Electronically Signed: By: Nolon Nations M.D. On: 01/03/2015 13:44    Micro  Results   Recent Results (from the past 240 hour(s))  MRSA PCR Screening     Status: None   Collection Time: 01/01/15  3:10 PM  Result Value Ref Range Status   MRSA by PCR NEGATIVE NEGATIVE Final    Comment:        The GeneXpert MRSA Assay (FDA approved for NASAL specimens only), is one component of a comprehensive MRSA colonization surveillance program. It is not intended to diagnose MRSA infection nor to guide or monitor treatment for MRSA infections.   Urine culture     Status: None   Collection Time: 01/01/15  4:46 PM  Result Value Ref Range Status   Specimen Description URINE, CLEAN CATCH  Final   Special Requests NONE  Final   Culture MULTIPLE SPECIES PRESENT, SUGGEST RECOLLECTION  Final   Report Status 01/03/2015 FINAL  Final  Surgical pcr  screen     Status: None   Collection Time: 01/05/15  5:47 AM  Result Value Ref Range Status   MRSA, PCR NEGATIVE NEGATIVE Final   Staphylococcus aureus NEGATIVE NEGATIVE Final    Comment:        The Xpert SA Assay (FDA approved for NASAL specimens in patients over 24 years of age), is one component of a comprehensive surveillance program.  Test performance has been validated by [facility] for patients greater than or equal to 39 year old. It is not intended to diagnose infection nor to guide or monitor treatment.     Subjective:   Alison Gross today has no headache, no chest or abdominal pain, feels much better wants to go home today.   Objective:   Blood pressure 92/38, pulse 64, temperature 98.5 F (36.9 C), temperature source Oral, resp. rate 19, height 5' 5"  (1.651 m), weight 58 kg (127 lb 13.9 oz), SpO2 100 %.   Intake/Output Summary (Last 24 hours) at 01/09/15 2108 Last data filed at 01/09/15 1800  Gross per 24 hour  Intake   1200 ml  Output    453 ml  Net    747 ml    Exam General exam: Elderly frail and thin-appearing female, in no acute distress; sitting up comfortably in bed and w/o acute  complaints. Tolerated HD treatment without any issues Respiratory system: fair air movement, no frank crackles, no wheezing ; scattered rhonchi on exam. No increased work of breathing. Cardiovascular system: S1 & S2 heard, RRR. No JVD, murmurs, gallops, clicks or pedal edema Gastrointestinal system: Abdomen is nondistended, soft and nontender. Normal bowel sounds heard. Central nervous system: Alert and oriented. No focal neurological deficits. Extremities: Symmetric 5 x 5 power. Was to edema bilaterally  Data Review   CBC w Diff:  Lab Results  Component Value Date   WBC 6.9 01/10/2015   WBC 4.5 09/18/2014   HGB 8.1* 01/10/2015   HGB 9.6* 09/18/2014   HCT 25.0* 01/10/2015   HCT 29.4* 09/18/2014   PLT 164 01/10/2015   PLT 319 09/18/2014   LYMPHOPCT 13 01/04/2015   LYMPHOPCT 13.3* 09/18/2014   MONOPCT 13 01/04/2015   MONOPCT 13.3 09/18/2014   EOSPCT 4 01/04/2015   EOSPCT 5.8 09/18/2014   BASOPCT 0 01/04/2015   BASOPCT 2.0 09/18/2014    CMP:  Lab Results  Component Value Date   NA 131* 01/10/2015   NA 143 09/18/2014   K 3.9 01/10/2015   K 4.0 09/18/2014   CL 98* 01/10/2015   CL 107 08/05/2012   CO2 25 01/10/2015   CO2 30* 09/18/2014   BUN 55* 01/10/2015   BUN 31.2* 09/18/2014   CREATININE 4.82* 01/10/2015   CREATININE 1.7* 09/18/2014   PROT 5.5* 01/01/2015   PROT 6.8 09/18/2014   ALBUMIN 2.1* 01/10/2015   ALBUMIN 3.3* 09/18/2014   BILITOT 0.3 01/01/2015   BILITOT 0.68 09/18/2014   ALKPHOS 93 01/01/2015   ALKPHOS 78 09/18/2014   AST 28 01/01/2015   AST 27 09/18/2014   ALT 30 01/01/2015   ALT 18 09/18/2014  .  Total Time in preparing paper work, data evaluation and todays exam - 94 minutes  Barton Dubois MD on 01/09/2015 at 9:08 PM  [facility]   Office  [phone]

## 2015-01-09 NOTE — Progress Notes (Addendum)
   Daily Progress Note  BP still not adequate.  Pt would be scheduled for L staged brachial vein transposition vs arteriovenous graft placement.  I suspect with SBP routine in 80s, any graft would thrombose.  Also, I'm not too confidant of the ability of any secondary access option to mature adequately with suboptimal blood pressures.  Will continue to check on the patient daily.  Adele Barthel, MD [facility] [place] Office: [phone] Pager: [phone]  01/09/2015, 9:58 AM  Addendum  Per my discussion with the hospitalist, will defer permanent access placement to outpatient basis to allow this patient time to recover from a cardiac viewpoint, given the persistence of hypotension.  The hospitalist is going to reload the Coumadin.  Can see the patient on an outpatient basis in 2 weeks.  Adele Barthel, MD [facility] [place] Office: [phone] Pager: [phone]

## 2015-01-09 NOTE — Discharge Instructions (Signed)
Follow with Primary MD Maximino Greenland, MD in 7 days   Get CBC, CMP,INR   checked  by Primary MD next visit.   Continue to drain Pleurex cath home as prior to admission. Activity: As tolerated with Full fall precautions use walker/cane & assistance as needed   Disposition Home    Diet: renal diet with fluid restriction 1200 ml daily , with feeding assistance and aspiration precautions.  For Heart failure patients - Check your Weight same time everyday, if you gain over 2 pounds, or you develop in leg swelling, experience more shortness of breath or chest pain, call your Primary MD immediately. Follow Cardiac Low Salt Diet and 1.5 lit/day fluid restriction.   On your next visit with your primary care physician please Get Medicines reviewed and adjusted.   Please request your Prim.MD to go over all Hospital Tests and Procedure/Radiological results at the follow up, please get all Hospital records sent to your Prim MD by signing hospital release before you go home.   If you experience worsening of your admission symptoms, develop shortness of breath, life threatening emergency, suicidal or homicidal thoughts you must seek medical attention immediately by calling 911 or calling your MD immediately  if symptoms less severe.  You Must read complete instructions/literature along with all the possible adverse reactions/side effects for all the Medicines you take and that have been prescribed to you. Take any new Medicines after you have completely understood and accpet all the possible adverse reactions/side effects.   Do not drive, operating heavy machinery, perform activities at heights, swimming or participation in water activities or provide baby sitting services if your were admitted for syncope or siezures until you have seen by Primary MD or a Neurologist and advised to do so again.  Do not drive when taking Pain medications.    Do not take more than prescribed Pain, Sleep and Anxiety  Medications  Special Instructions: If you have smoked or chewed Tobacco  in the last 2 yrs please stop smoking, stop any regular Alcohol  and or any Recreational drug use.  Wear Seat belts while driving.   Please note  You were cared for by a hospitalist during your hospital stay. If you have any questions about your discharge medications or the care you received while you were in the hospital after you are discharged, you can call the unit and asked to speak with the hospitalist on call if the hospitalist that took care of you is not available. Once you are discharged, your primary care physician will handle any further medical issues. Please note that NO REFILLS for any discharge medications will be authorized once you are discharged, as it is imperative that you return to your primary care physician (or establish a relationship with a primary care physician if you do not have one) for your aftercare needs so that they can reassess your need for medications and monitor your lab values.

## 2015-01-09 NOTE — Progress Notes (Signed)
ANTICOAGULATION CONSULT NOTE - Follow Up Consult  Pharmacy Consult for warfarin Indication: atrial fibrillation  No Known Allergies  Patient Measurements: Height: '5\' 5"'$  (165.1 cm) Weight: 127 lb 13.9 oz (58 kg) IBW/kg (Calculated) : 57 Heparin Dosing Weight:  56.7 kg  Vital Signs: Temp: 97.8 F (36.6 C) (11/01 0700) Temp Source: Oral (11/01 1300) BP: 90/36 mmHg (11/01 1200) Pulse Rate: 62 (11/01 1200)  Labs:  Recent Labs  01/07/15 0256 01/07/15 0818 01/07/15 1840 01/08/15 0741 01/08/15 0742 01/09/15 0310  HGB 7.8*  --   --  8.0*  --  7.7*  HCT 24.7*  --   --  25.3*  --  23.9*  PLT 190  --   --  212  --  92*  APTT  --   --   --   --  >200*  --   LABPROT 17.0*  --   --   --   --   --   INR 1.37  --   --   --   --   --   HEPARINUNFRC  --  <0.10* <0.10* 0.72*  --   --   CREATININE 4.10*  --   --  4.80*  --  4.07*    Estimated Creatinine Clearance: 9.9 mL/min (by C-G formula based on Cr of 4.07).   Patient on chronic anticoagulation for afib. Admit INR 5.88 on Coumadin '2mg'$  daily, dose held 10/24, vitamin k 5 mg po given 10/26 at noon, vitamin K 10 mg IV given 10/26 at 1604.   Warfarin was held for AVF/AVG placement on Wednesday 11/2. However, patient has had persistently low BPs and there is risk for access thrombosis, so procedure being deferred to outpatient procedure.  To restart warfarin 11/1. Heparin was discontinued 10/31 as patient had consistently low levels, and was a very difficult stick or labs were obtained incorrectly. Due to concern for safety, infusion was stopped.  INR 1.37 most recently.  Hgb 7.7, plts dropped to 92 this morning. To get a transfusion during HD tomorrow. Note patient was clipped to [facility] center on MWF schedule per note from [place] on 10/31.   Goal of Therapy:  INR 2-3 Monitor platelets by anticoagulation protocol: Yes  Plan:  -warfarin '5mg'$  po x1 tonight  -daily INR and CBC -follow s/s bleeding  Holdyn Poyser D. Eliane Hammersmith, PharmD,  BCPS Clinical Pharmacist Pager: [phone] 01/09/2015 1:47 PM

## 2015-01-09 NOTE — Progress Notes (Signed)
Physical Therapy Treatment Patient Details Name: Alison Gross MRN: [id number] DOB: Sep 18, 1932 Today's Date: 01/09/2015    History of Present Illness Pt is an 81 y/o F with PMH of HTN, DM, breast cancer S/P bilateral mastectomies, goiter, PAF, CKD, recurrent pleural effusion with pleurex catheter on R, DVT R axillary/subclavian veins admitted with worsening renal function.   She presents with increased DOE and LE edema.    PT Comments    Pt admitted with above diagnosis. Pt currently with functional limitations due to balance and endurance deficits. Pt ambulated without device with unsteady gait.  Will benefit from RW and 3N1 as well as HHPT f/u.   Pt will benefit from skilled PT to increase their independence and safety with mobility to allow discharge to the venue listed below.    Follow Up Recommendations  Home health PT;Supervision/Assistance - 24 hour     Equipment Recommendations  Rolling walker with 5" wheels;3in1 (PT)    Recommendations for Other Services       Precautions / Restrictions Precautions Precautions: Fall Restrictions Weight Bearing Restrictions: No    Mobility  Bed Mobility               General bed mobility comments: Pt on bedside commode on arrival.  Pt with BM and urinated in 3N1.  Pt able to clean herself once provided with washclothes.  Also able to pull up underwear.    Transfers Overall transfer level: Needs assistance Equipment used: None Transfers: Sit to/from [facility] Sit to Stand: Supervision Stand pivot transfers: Supervision       General transfer comment: Pt steady for transfers  Ambulation/Gait Ambulation/Gait assistance: Min guard;Min assist (some what unsteady especially with turns. ) Ambulation Distance (Feet): 150 Feet Assistive device: None Gait Pattern/deviations: Step-through pattern;Drifts right/left;Decreased stride length;Staggering left;Staggering right Gait velocity: slower Gait velocity  interpretation: Below normal speed for age/gender General Gait Details: BP runs low per pt.  Low on arrival as well but pt without dizziness.  Pt did not want to use RW.  In hallway, reaches for rails.  Pt with LOB needing steadying assit with turns.  Spoke with pt to use RW and pt still not wanting to but PT encouraged pt to.    Stairs            Wheelchair Mobility    Modified Rankin (Stroke Patients Only)       Balance Overall balance assessment: Needs assistance;History of Falls         Standing balance support: No upper extremity supported;During functional activity Standing balance-Leahy Scale: Poor Standing balance comment: requires UE support for standing balance with challenges and in uncontrolled environment.                     Cognition Arousal/Alertness: Awake/alert Behavior During Therapy: WFL for tasks assessed/performed Overall Cognitive Status: Within Functional Limits for tasks assessed                      Exercises General Exercises - Lower Extremity Ankle Circles/Pumps: AROM;Both;10 reps;Seated Long Arc Quad: AROM;Both;10 reps;Seated Hip Flexion/Marching: AROM;Both;10 reps;Seated    General Comments        Pertinent Vitals/Pain Pain Assessment: No/denies pain  VSS with sats >90% on RA.      Home Living                      Prior Function  PT Goals (current goals can now be found in the care plan section) Progress towards PT goals: Progressing toward goals    Frequency  Min 3X/week    PT Plan Current plan remains appropriate    Co-evaluation             End of Session Equipment Utilized During Treatment: Gait belt Activity Tolerance: Patient tolerated treatment well Patient left: in chair;with call bell/phone within reach;with chair alarm set     Time: 1005-1029 PT Time Calculation (min) (ACUTE ONLY): 24 min  Charges:  $Gait Training: 8-22 mins $Therapeutic Exercise: 8-22 mins                     G CodesIrwin Brakeman F 2015-01-30, 11:51 AM [facility] [phone] (pager)

## 2015-01-09 NOTE — Care Management Important Message (Signed)
Important Message  Patient Details  Name: Alison Gross MRN: [id number] Date of Birth: March 26, 1932   Medicare Important Message Given:  Yes-third notification given    [name] May 01/09/2015, 10:06 AM

## 2015-01-10 ENCOUNTER — Telehealth: Payer: Self-pay | Admitting: Vascular Surgery

## 2015-01-10 DIAGNOSIS — N186 End stage renal disease: Secondary | ICD-10-CM | POA: Insufficient documentation

## 2015-01-10 DIAGNOSIS — E0590 Thyrotoxicosis, unspecified without thyrotoxic crisis or storm: Secondary | ICD-10-CM | POA: Insufficient documentation

## 2015-01-10 LAB — CBC
HCT: 25 % — ABNORMAL LOW (ref 36.0–46.0)
Hemoglobin: 8.1 g/dL — ABNORMAL LOW (ref 12.0–15.0)
MCH: 28.7 pg (ref 26.0–34.0)
MCHC: 32.4 g/dL (ref 30.0–36.0)
MCV: 88.7 fL (ref 78.0–100.0)
PLATELETS: 164 10*3/uL (ref 150–400)
RBC: 2.82 MIL/uL — AB (ref 3.87–5.11)
RDW: 18.6 % — AB (ref 11.5–15.5)
WBC: 6.9 10*3/uL (ref 4.0–10.5)

## 2015-01-10 LAB — PROTIME-INR
INR: 1.22 (ref 0.00–1.49)
PROTHROMBIN TIME: 15.6 s — AB (ref 11.6–15.2)

## 2015-01-10 LAB — RENAL FUNCTION PANEL
Albumin: 2.1 g/dL — ABNORMAL LOW (ref 3.5–5.0)
Anion gap: 8 (ref 5–15)
BUN: 55 mg/dL — ABNORMAL HIGH (ref 6–20)
CALCIUM: 8.6 mg/dL — AB (ref 8.9–10.3)
CO2: 25 mmol/L (ref 22–32)
CREATININE: 4.82 mg/dL — AB (ref 0.44–1.00)
Chloride: 98 mmol/L — ABNORMAL LOW (ref 101–111)
GFR, EST AFRICAN AMERICAN: 9 mL/min — AB (ref >60)
GFR, EST NON AFRICAN AMERICAN: 8 mL/min — AB (ref >60)
Glucose, Bld: 113 mg/dL — ABNORMAL HIGH (ref 65–99)
Phosphorus: 5.3 mg/dL — ABNORMAL HIGH (ref 2.5–4.6)
Potassium: 3.9 mmol/L (ref 3.5–5.1)
SODIUM: 131 mmol/L — AB (ref 135–145)

## 2015-01-10 LAB — GLUCOSE, CAPILLARY
GLUCOSE-CAPILLARY: 80 mg/dL (ref 65–99)
GLUCOSE-CAPILLARY: 154 mg/dL — AB (ref 65–99)

## 2015-01-10 MED ORDER — PENTAFLUOROPROP-TETRAFLUOROETH EX AERO: 1.0000 "application " | INHALATION_SPRAY | CUTANEOUS | Status: DC | PRN

## 2015-01-10 MED ORDER — WARFARIN SODIUM 2 MG PO TABS: 2.0000 mg | ORAL_TABLET | Freq: Every day | ORAL | Status: DC

## 2015-01-10 MED ORDER — HEPARIN SODIUM (PORCINE) 1000 UNIT/ML DIALYSIS: 1000.0000 [IU] | INTRAMUSCULAR | Status: DC | PRN

## 2015-01-10 MED ORDER — LIDOCAINE HCL (PF) 1 % IJ SOLN: 5.0000 mL | INTRAMUSCULAR | Status: DC | PRN

## 2015-01-10 MED ORDER — LIDOCAINE-PRILOCAINE 2.5-2.5 % EX CREA: 1.0000 "application " | TOPICAL_CREAM | CUTANEOUS | Status: DC | PRN

## 2015-01-10 MED ORDER — WARFARIN SODIUM 5 MG PO TABS
5.0000 mg | ORAL_TABLET | Freq: Once | ORAL | Status: AC
  Administered 2015-01-10: 5 mg via ORAL
  Filled 2015-01-10: qty 1

## 2015-01-10 MED ORDER — SODIUM CHLORIDE 0.9 % IV SOLN: 100.0000 mL | INTRAVENOUS | Status: DC | PRN

## 2015-01-10 MED ORDER — ALTEPLASE 2 MG IJ SOLR
2.0000 mg | Freq: Once | INTRAMUSCULAR | Status: DC | PRN
  Filled 2015-01-10: qty 2

## 2015-01-10 MED ORDER — HEPARIN SODIUM (PORCINE) 1000 UNIT/ML DIALYSIS: 20.0000 [IU]/kg | INTRAMUSCULAR | Status: DC | PRN

## 2015-01-10 NOTE — Procedures (Signed)
I was present at this dialysis session. I have reviewed the session itself and made appropriate changes.   Plan for transfusion 1 u PRBC.  Toelrating well via [place].  Plan for DC today to [facility].   Pearson Grippe  MD 01/10/2015, 9:02 AM

## 2015-01-10 NOTE — Care Management Note (Signed)
Case Management Note  Patient Details  Name: Alison Gross MRN: [id number] Date of Birth: 08-30-1932  Subjective/Objective:      Pt to discharge home with spouse, OP HD has been arranged @ [facility].  Spouse has SCAT application that he will complete as backup transportation in the event he or dtrs are unable to transport pt.            Expected Discharge Plan:  [place]  Discharge planning Services  CM Consult  Post Acute Care Choice:  Resumption of Svcs/PTA Provider, Durable Medical Equipment  DME Arranged:  3-N-1, Walker rolling DME Agency:  [place] Arranged:  RN, PT [facility] Agency:  [place]  Status of Service:  Completed, signed off  Medicare Important Message Given:  Yes-third notification given  Girard Cooter, RN 01/10/2015, 3:06 PM

## 2015-01-10 NOTE — Progress Notes (Addendum)
Pt discharged per w/c with all belongings. Husband accompanied pt home via private car. Pt had all discharge instructions and med schedule & paper prescriptions. Talked to husband about dropping prescriptions off tomight and than picking them up first thing in the morning. Pt had all her meds she needs tonight including coumadin and given Midodrine just prior to leaving. Pt also had her 3 in 1 and front wheeled walker delivered to her room and taken home by pt and husband.

## 2015-01-10 NOTE — Progress Notes (Signed)
ANTICOAGULATION CONSULT NOTE - Follow Up Consult  Pharmacy Consult for warfarin Indication: atrial fibrillation  No Known Allergies  Patient Measurements: Height: '5\' 5"'$  (165.1 cm) Weight: 126 lb 15.8 oz (57.6 kg) IBW/kg (Calculated) : 57  Vital Signs: Temp: 98.1 F (36.7 C) (11/02 1120) Temp Source: Oral (11/02 0841) BP: 81/47 mmHg (11/02 1120) Pulse Rate: 65 (11/02 1120)  Labs:  Recent Labs  01/07/15 1840  01/08/15 0741 01/08/15 0742 01/09/15 0310 01/10/15 0216  HGB  --   < > 8.0*  --  7.7* 8.1*  HCT  --   --  25.3*  --  23.9* 25.0*  PLT  --   --  212  --  92* 164  APTT  --   --   --  >200*  --   --   LABPROT  --   --   --   --   --  15.6*  INR  --   --   --   --   --  1.22  HEPARINUNFRC <0.10*  --  0.72*  --   --   --   CREATININE  --   --  4.80*  --  4.07* 4.82*  < > = values in this interval not displayed.  Estimated Creatinine Clearance: 8.4 mL/min (by C-G formula based on Cr of 4.82).  Assessment: Patient on chronic anticoagulation for afib. Admit INR 5.88 on Coumadin '2mg'$  daily, dose held 10/24, vitamin k 5 mg po given 10/26 at noon, vitamin K 10 mg IV given 10/26 at 1604.   Warfarin was held for AVF/AVG placement on Wednesday 11/2. However, patient has had persistently low BPs and there is risk for access thrombosis, so procedure being deferred to outpatient procedure. Heparin was discontinued 10/31 as patient had consistently low levels, and was a very difficult stick or labs were obtained incorrectly. Due to concern for safety, infusion was stopped.. Warfarin resumed 11/1.  INR 1.22. Hgb8.1, plts improved to 164 after transfusion.  Note patient was clipped to [facility] center on MWF schedule.  Goal of Therapy:  INR 2-3 Monitor platelets by anticoagulation protocol: Yes  Plan:  -patient to discharge 11/2- recommend she takes '4mg'$  (two '2mg'$  tablets) on 11/2 and 11/3, and then resumes home dose of '2mg'$  daily on Friday 11/4. INR checks should be able to be  done at HD center- recommend either Friday 11/4 or Monday 11/7  **no dose is entered for inpatient use on 11/2- RN to call pharmacy if patient is not discharged and a dose is needed**  -daily INR and CBC while she is in the hospital -follow s/s bleeding  Audra Kagel D. Renelda Kilian, PharmD, BCPS Clinical Pharmacist Pager: [phone] 01/10/2015 11:40 AM

## 2015-01-10 NOTE — Telephone Encounter (Signed)
Spoke with pt, dpm °

## 2015-01-10 NOTE — Progress Notes (Signed)
Drained Pleurex catheter obtained 550 cc yellow clear drainage Area cleansed and redressed with products in kit- precut 4x4 drain gauze gently wrapped catheter covered with another 4x4 and used occlusive dressing Husband and pt observed drainage. Pt tolerated procedure well.

## 2015-01-10 NOTE — Telephone Encounter (Signed)
-----   Message from Gabriel Earing, [place] sent at 01/10/2015  8:28 AM EDT ----- Needs to see Dr. Bridgett Larsson in 2 weeks for consideration of access.  Thanks, Aldona Bar

## 2015-01-10 NOTE — Progress Notes (Signed)
01/10/2015 9:22 AM Hemodialysis Outpatient Update; I have instructed the patient to arrive for her outpatient dialysis at [facility] on Friday November 4th at 11:45 AM. Patient asked about transportation assistance at that time. I told [name] I would ask the social worker here if they could help her obtain a SCAT application. I did let her know there may not be enough time to get that done as she is supposed to be discharged today. She just wants something in place as a backup in case her husband/family can not transport her. I asked if she would be able to get to the center on Friday and she stated that she would. Gordy Savers

## 2015-01-10 NOTE — Progress Notes (Signed)
CSW informed by dialysis worker that pt is interested in transport resources (SCAT)  CSW provided pt with resources- no further needs at this time  CSW signing off  Domenica Reamer, [place] Worker [phone]

## 2015-01-11 ENCOUNTER — Other Ambulatory Visit: Payer: Self-pay | Admitting: *Deleted

## 2015-01-11 LAB — TYPE AND SCREEN
ABO/RH(D): O POS
Antibody Screen: NEGATIVE
UNIT DIVISION: 0
Unit division: 0

## 2015-01-11 LAB — CHROMOSOME ANALYSIS, BONE MARROW

## 2015-01-11 NOTE — Patient Outreach (Signed)
Member recently admitted to hospital with heart failure and kidney failure, requiring hemodialysis.  She was discharged yesterday.  Call placed to member to initiate transition of care program.  Member states that she is "doing all right."  She reports that she has gotten all of her new medications from the pharmacy and is taking them all as prescribed.  Medications to stop taking discussed according to the physician's discharge summary.  She reports that she continues to monitor her blood pressure and weight daily, and still drain her pleurX catheter daily.  She states that her output has decreased slightly since being on dialysis.    Member denies pain at this time.  She states that she is set up for hemodialysis tomorrow, and will follow a Monday, Wednesday, Friday schedule as an outpatient.  She questions about getting transportation set up just in case her family is unable to take her.  Made aware that according to the inpatient chart, the SCAT application was completed, and that she is now to wait to be contacted for further evaluation.    This care manager discussed with member the need for a routine home visit, scheduled for 11/15.  She requests for this care manager to call the day before to make sure that she is "up to visitors."  Informed member that this care manager will call next week for the weekly transition of care program.  Member instructed to contact this care manager with any concerns or questions.  Valente David, BSN, [place] Management  [facility] Care Manager [phone]

## 2015-01-15 ENCOUNTER — Encounter (HOSPITAL_COMMUNITY): Payer: Self-pay

## 2015-01-19 ENCOUNTER — Other Ambulatory Visit: Payer: Self-pay | Admitting: *Deleted

## 2015-01-19 NOTE — Patient Outreach (Signed)
Weekly transition of care call placed to member. No answer,HIPPA compliant voice message left.    Member immediately calls back, stating that she is currently in dialysis and will call back later.  Valente David, BSN, [place] Management  [facility] Care Manager [phone]

## 2015-01-22 ENCOUNTER — Other Ambulatory Visit: Payer: Self-pay | Admitting: Cardiology

## 2015-01-22 DIAGNOSIS — R609 Edema, unspecified: Secondary | ICD-10-CM

## 2015-01-23 ENCOUNTER — Other Ambulatory Visit: Payer: Commercial Managed Care - HMO

## 2015-01-23 ENCOUNTER — Encounter: Payer: Self-pay | Admitting: Vascular Surgery

## 2015-01-23 ENCOUNTER — Ambulatory Visit: Payer: Commercial Managed Care - HMO

## 2015-01-23 ENCOUNTER — Ambulatory Visit
Admission: RE | Admit: 2015-01-23 | Discharge: 2015-01-23 | Disposition: A | Discharge status: Home / Self Care | Payer: Commercial Managed Care - HMO | Source: Ambulatory Visit | Attending: Cardiology | Admitting: Cardiology

## 2015-01-23 ENCOUNTER — Other Ambulatory Visit: Payer: Self-pay | Admitting: *Deleted

## 2015-01-23 DIAGNOSIS — R609 Edema, unspecified: Secondary | ICD-10-CM

## 2015-01-23 NOTE — Patient Outreach (Signed)
[name]   01/23/2015  Alison Gross 1932/05/15 [id number]  Alison Gross is an 81 y.o. female  Subjective:   Member reports that she is "tired, but I'm making it."  She states that she is "winded" when she is up and moving around, but otherwise feels "ok."    Objective:   Review of Systems  Constitutional: Negative.   HENT: Negative.   Eyes: Negative.   Respiratory: Positive for shortness of breath.   Cardiovascular: Positive for leg swelling.  Gastrointestinal: Negative.   Genitourinary: Negative.   Musculoskeletal: Negative.   Skin: Negative.   Neurological: Negative.   Endo/Heme/Allergies: Negative.   Psychiatric/Behavioral: Negative.     Physical Exam  Constitutional: She is oriented to person, place, and time. She appears well-developed.  Neck: Normal range of motion.  Cardiovascular: Normal rate, regular rhythm and normal heart sounds.   Respiratory: Effort normal.  Rales at the right base   GI: Soft. Bowel sounds are normal.  Musculoskeletal: Normal range of motion.  Neurological: She is alert and oriented to person, place, and time.  Skin: Skin is warm and dry.   BP 94/52 mmHg  Pulse 78  Resp 22  Wt 118 lb (53.524 kg)  SpO2 90%  Current Medications:   Current Outpatient Prescriptions  Medication Sig Dispense Refill  . acetaminophen (TYLENOL) 325 MG tablet Take 2 tablets (650 mg total) by mouth every 4 (four) hours as needed for headache or mild pain.    Marland Kitchen anastrozole (ARIMIDEX) 1 MG tablet TAKE 1 TABLET (1 MG TOTAL) BY MOUTH DAILY. 30 tablet 5  . digoxin (LANOXIN) 0.125 MG tablet Please Take one tablet oral daily from Monday to Friday, not on Saturday or Sunday. 30 tablet 0  . methimazole (TAPAZOLE) 10 MG tablet Take 2 tablets (20 mg total) by mouth daily. 60 tablet 0  . midodrine (PROAMATINE) 10 MG tablet Take 1 tablet (10 mg total) by mouth 3 (three) times daily with meals. 90 tablet 1  . Multiple Vitamin  (MULTIVITAMIN WITH MINERALS) TABS Take 1 tablet by mouth every other day.     . Nutritional Supplements (FEEDING SUPPLEMENT, NEPRO CARB STEADY,) LIQD Take 237 mLs by mouth 3 (three) times daily with meals. 24 Can 0  . senna-docusate (SENOKOT-S) 8.6-50 MG tablet Take 1 tablet by mouth 2 (two) times daily. 30 tablet 0  . thiamine 100 MG tablet Take 1 tablet (100 mg total) by mouth daily. 90 tablet 0  . VENTOLIN HFA 108 (90 BASE) MCG/ACT inhaler Inhale 2 puffs into the lungs every 4 (four) hours as needed for wheezing or shortness of breath.   0  . warfarin (COUMADIN) 2 MG tablet Take 1 tablet (2 mg total) by mouth daily at 6 PM. Take '4mg'$  on 11/2 and 11/3; then resume prior home dose of '2mg'$  daily  0  . ferrous sulfate 325 (65 FE) MG tablet Take 1 tablet (325 mg total) by mouth 3 (three) times daily with meals. (Patient not taking: Reported on 01/23/2015) 90 tablet 0  . multivitamin (RENA-VIT) TABS tablet Take 1 tablet by mouth at bedtime. (Patient not taking: Reported on 01/23/2015) 30 tablet 0   No current facility-administered medications for this visit.    Functional Status:   In your present state of health, do you have any difficulty performing the following activities: 01/01/2015 11/07/2014  Hearing? N N  Vision? N N  Difficulty concentrating or making decisions? N N  Walking or climbing stairs? Alison Gross  Y  Dressing or bathing? N N  Doing errands, shopping? N Y  Conservation officer, nature and eating ? - -  Using the Toilet? - -  In the past six months, have you accidently leaked urine? - -  Do you have problems with loss of bowel control? - -  Managing your Medications? - -  Managing your Finances? - -  Housekeeping or managing your Housekeeping? - -    Fall/Depression Screening:    PHQ 2/9 Scores 10/18/2014  PHQ - 2 Score 0    Assessment:    Arrived at [facility] home just as home health physical therapist was leaving.  Therapist states that she could not do much with the member today because she  stated that she was not feeling well, stating that she was feeling "woozy."  When member was questioned about this, she states that she would have some symptoms, such as weakness and nausea, while she was getting accustomed to hemodialysis.  This care manager inquired about member's tolerance with her dialysis since her discharge, she states that she has been doing fine, some days better than others.  She reports seeing the nephrologist at least once a week at the dialysis center.  She denies any concerns expressed by the physicians.    She denies any follow up with her cardiologist and her primary care physician since her discharge.  She states that she has an appointment with her cardiologist in January and does not know when her next appointment with her PCP is.  Discharge instructions reviewed, and member made aware of the need for follow up with both within the next week.  Call placed to Dr. Irven Shelling office, nurse practitioner made aware of member's recent admission.  Member has appointment for ultrasound today (for right arm swelling to rule out a blood clot) and is asked to come to see the nurse practitioner after the ultrasound.  Member made aware and states that she will be able to make appointment.  PCP office also called to schedule appointment, message left with contact information for this care manager and for member.  Call received back from PCP office stating that a hospital follow up appointment has been made.  She already has appointments with the vascular surgeon, endocrinologist, cardiac surgeon, and the cancer center scheduled over the next several weeks.  She denies a need for transportation for these appointments.  Although she has transportation for her physician appointments, she still expresses interest in applying for SCAT to travel to her dialysis sessions.  She denies receiving any information for SCAT since her discharge.  Informed that this care manager will contact social worker to  provide application assistance.  Member still has pleurX catheter in place and draining daily.  She is documenting the drainage, her weight, her blood pressure, and her heart rate daily in her [facility] calendar tool.  The swelling in her legs is significantly decreased during this home visit.  She states that she has not had as much trouble with the swelling since she has started dialysis.  She does now have oxygen and wears it at night and when she is short of breath.  She is currently wearing it, stating that she felt as if her heart was "racing" this morning and she was a little short of breath, and she thought it would make her feel better.  She does express relief since being on the oxygen.    Medications reviewed, she reports taking them all as prescribed.  She denies any  further concerns.  Encouraged to contact this care manager with any questions.    Plan:   Will continue with  Weekly transition of care calls next week. Routine home visit scheduled for next month. Will contact social worker to provide SCAT information.  THN CM Care Plan Problem One        Most Recent Value   Care Plan Problem One  Recent hospitalization   Role Documenting the Problem One  Care Management Coordinator   Care Plan for Problem One  Active   THN Long Term Goal (31-90 days)  Member will not be readmitted to hospital within the next 31 days   THN Long Term Goal Start Date  01/11/15   Interventions for Problem One Long Term Goal  Discussed with member the importance of following discharge instructions, including follow up appointments, medications, diet, and home health involvement, to decrease the risk of readmission   THN CM Short Term Goal #1 (0-30 days)  Member will take medications as prescribed over the next 4 weeks   THN CM Short Term Goal #1 Start Date  01/11/15   Interventions for Short Term Goal #1  Discussed with member the importance of following discharge instructions, including follow up appointments,  medications, diet, and home health involvement, to decrease the risk of readmission   THN CM Short Term Goal #2 (0-30 days)  Member will have follow up appointment with cardiologist and PCP within the next 4 weeks   THN CM Short Term Goal #2 Start Date  01/11/15   Interventions for Short Term Goal #2  Discussed with member the importance of following discharge instructions, including follow up appointments, medications, diet, and home health involvement, to decrease the risk of readmission     Valente David, BSN, [place] Manager [phone]

## 2015-01-26 ENCOUNTER — Encounter: Payer: Self-pay | Admitting: Vascular Surgery

## 2015-01-26 ENCOUNTER — Ambulatory Visit (INDEPENDENT_AMBULATORY_CARE_PROVIDER_SITE_OTHER): Payer: Commercial Managed Care - HMO | Admitting: Vascular Surgery

## 2015-01-26 ENCOUNTER — Other Ambulatory Visit: Payer: Self-pay

## 2015-01-26 VITALS — BP 130/76 | HR 71 | Ht 65.0 in | Wt 115.0 lb

## 2015-01-26 DIAGNOSIS — N179 Acute kidney failure, unspecified: Secondary | ICD-10-CM

## 2015-01-26 DIAGNOSIS — N189 Chronic kidney disease, unspecified: Secondary | ICD-10-CM | POA: Diagnosis not present

## 2015-01-26 NOTE — Progress Notes (Signed)
Established Dialysis Access  History of Present Illness  Alison Gross is a 81 y.o. (Apr 19, 1932) female who presents for re-evaluation for permanent access.  The patient is right hand dominant.  No previous access procedures have been completed.  The patient has never had a previous PPM placed.  She currently has a RIJV TDC.  I saw her in the hospital when he SBP were chronically in 80s.  I recommending letting her heart recover before consider permanent access placement.  Past Medical History  Diagnosis Date  . Hypercholesterolemia     takes Crestor daily  . Thyroid disease     had iodine radiation  . Hypertension     takes Hyzaar daily  . Dysrhythmia     takes Carvedilol daily  . Pneumonia     history of;last time about 4-35yr ago  . GERD (gastroesophageal reflux disease)     takes Protonix daily  . Gastric ulcer   . History of blood transfusion   . History of colon polyps   . Anemia     takes iron pill daily  . Diabetes mellitus without complication ([facility]     takes Tradjenta daily  . History of radiation therapy 07/12/12-08/26/12    right breast/  . Paroxysmal atrial fibrillation ([facility]     PCP EKG 09/27/2013: A. Fibrillation.. EKG 09/29/2013: S. Tach.  . Breast cancer ([facility] 04/12/12    right-pos lymph node/left-DCIS  . Shortness of breath on exertion   . Bruit of left carotid artery   . Acute upper GI bleeding 01/24/2012  . MGUS (monoclonal gammopathy of unknown significance) 04/21/2013  . Osteoporosis 11/29/2013  . Recurrent right pleural effusion 07/26/2014  . Stage III chronic kidney disease 07/26/2014  . Goiter 07/27/2014  . Substernal goiter 12/23/2012  . Pleural effusion, right 12/23/2012  . Type 2 diabetes mellitus with renal manifestations ([facility] 07/26/2014  . Dysphonia 09/20/2014  . Vocal cord paralysis     Suspected due to massive substernal goiter  . CHF (congestive heart failure) ([facility]     Past Surgical History  Procedure Laterality Date  . Carpal tunnel  release Left   . Breast biopsy    . Esophagogastroduodenoscopy  01/24/2012    Procedure: ESOPHAGOGASTRODUODENOSCOPY (EGD);  Surgeon: MJeryl Columbia MD;  [facility];  Service: Endoscopy;  Laterality: N/A;  . Thyroid surgery      "removed goiter"  . Cystectomy      from back of neck  . Knee surgery      right with rods  . Esophagogastroduodenoscopy    . Colonoscopy    . Total mastectomy Bilateral 05/10/2012    Procedure: RIGHT modified mastectomy; LEFT total mastectomy;  Surgeon: CHaywood Lasso MD;  [facility] [facility]  Service: General;  Laterality: Bilateral;  . Axillary sentinel node biopsy Right 05/10/2012    Procedure: AXILLARY SENTINEL lymph  NODE BIOPSY;  Surgeon: CHaywood Lasso MD;  [facility] [facility]  Service: General;  Laterality: Right;  nuclear medicine injection right side  7:00 am   . Abdominal hysterectomy      fibroids, with bilateral SO  . Chest tube insertion Right 09/27/2014    Procedure: INSERTION OF RIGHT PLEURAL DRAINAGE CATHETER;  Surgeon: EGrace Isaac MD;  [facility]  Service: Thoracic;  Laterality: Right;  . Insertion of dialysis catheter N/A 01/05/2015    Procedure: INSERTION OF DIALYSIS CATHETER;  Surgeon: CAngelia Mould MD;  [place] [facility]  Service: Vascular;  Laterality: N/A;  Social History   Social History  . Marital Status: Married    Spouse Name: [name]  . Number of Children: 2  . Years of Education: N/A   Occupational History  . Not on file.   Social History Main Topics  . Smoking status: Former Smoker -- 0.00 packs/day    Types: Cigarettes    Quit date: 07/26/1971  . Smokeless tobacco: Never Used  . Alcohol Use: No  . Drug Use: No  . Sexual Activity: Not Currently    Birth Control/ Protection: Surgical     Comment: menarche age 31,1st live birth 20, P2, no HRT   Other Topics Concern  . Not on file   Social History Narrative   Married.  Ambulates independently.      Family History  Problem Relation  Age of Onset  . Brain cancer Mother   . Aneurysm Father   . Diabetes Sister   . Diabetes Brother   . Diabetes Sister   . Diabetes Brother   . Heart failure Sister      Current Outpatient Prescriptions  Medication Sig Dispense Refill  . acetaminophen (TYLENOL) 325 MG tablet Take 2 tablets (650 mg total) by mouth every 4 (four) hours as needed for headache or mild pain.    Marland Kitchen anastrozole (ARIMIDEX) 1 MG tablet TAKE 1 TABLET (1 MG TOTAL) BY MOUTH DAILY. 30 tablet 5  . digoxin (LANOXIN) 0.125 MG tablet Please Take one tablet oral daily from Monday to Friday, not on Saturday or Sunday. 30 tablet 0  . methimazole (TAPAZOLE) 10 MG tablet Take 2 tablets (20 mg total) by mouth daily. 60 tablet 0  . midodrine (PROAMATINE) 10 MG tablet Take 1 tablet (10 mg total) by mouth 3 (three) times daily with meals. 90 tablet 1  . Multiple Vitamin (MULTIVITAMIN WITH MINERALS) TABS Take 1 tablet by mouth every other day.     . Nutritional Supplements (FEEDING SUPPLEMENT, NEPRO CARB STEADY,) LIQD Take 237 mLs by mouth 3 (three) times daily with meals. 24 Can 0  . senna-docusate (SENOKOT-S) 8.6-50 MG tablet Take 1 tablet by mouth 2 (two) times daily. 30 tablet 0  . thiamine 100 MG tablet Take 1 tablet (100 mg total) by mouth daily. 90 tablet 0  . VENTOLIN HFA 108 (90 BASE) MCG/ACT inhaler Inhale 2 puffs into the lungs every 4 (four) hours as needed for wheezing or shortness of breath.   0  . warfarin (COUMADIN) 2 MG tablet Take 1 tablet (2 mg total) by mouth daily at 6 PM. Take '4mg'$  on 11/2 and 11/3; then resume prior home dose of '2mg'$  daily  0  . amiodarone (PACERONE) 200 MG tablet     . atorvastatin (LIPITOR) 10 MG tablet     . BAYER CONTOUR NEXT TEST test strip     . ferrous sulfate 325 (65 FE) MG tablet Take 1 tablet (325 mg total) by mouth 3 (three) times daily with meals. (Patient not taking: Reported on 01/23/2015) 90 tablet 0  . furosemide (LASIX) 40 MG tablet     . MEPHYTON 5 MG tablet     . metoprolol  tartrate (LOPRESSOR) 25 MG tablet     . MICROLET LANCETS MISC     . multivitamin (RENA-VIT) TABS tablet Take 1 tablet by mouth at bedtime. (Patient not taking: Reported on 01/23/2015) 30 tablet 0  . torsemide (DEMADEX) 20 MG tablet     . warfarin (COUMADIN) 1 MG tablet      No current facility-administered  medications for this visit.     No Known Allergies   REVIEW OF SYSTEMS:  (Positives checked otherwise negative)  CARDIOVASCULAR:   '[ ]'$  chest pain,  '[ ]'$  chest pressure,  '[ ]'$  palpitations,  '[ ]'$  shortness of breath when laying flat,  '[ ]'$  shortness of breath with exertion,   '[ ]'$  pain in feet when walking,  '[ ]'$  pain in feet when laying flat, '[ ]'$  history of blood clot in veins (DVT),  '[ ]'$  history of phlebitis,  '[ ]'$  swelling in legs,  '[ ]'$  varicose veins  PULMONARY:   '[ ]'$  productive cough,  '[ ]'$  asthma,  '[ ]'$  wheezing  NEUROLOGIC:   '[ ]'$  weakness in arms or legs,  '[ ]'$  numbness in arms or legs,  '[ ]'$  difficulty speaking or slurred speech,  '[ ]'$  temporary loss of vision in one eye,  '[ ]'$  dizziness  HEMATOLOGIC:   '[ ]'$  bleeding problems,  '[ ]'$  problems with blood clotting too easily  MUSCULOSKEL:   '[ ]'$  joint pain, '[ ]'$  joint swelling  GASTROINTEST:   '[ ]'$  vomiting blood,  '[ ]'$  blood in stool     GENITOURINARY:   '[ ]'$  burning with urination,  '[ ]'$  blood in urine '[x]'$  ESRD-HD: M-W-F  PSYCHIATRIC:   '[ ]'$  history of major depression  INTEGUMENTARY:   '[ ]'$  rashes,  '[ ]'$  ulcers  CONSTITUTIONAL:   '[ ]'$  fever,  '[ ]'$  chills     Physical Examination  Filed Vitals:   01/26/15 0919  BP: 130/76  Pulse: 71  Height: '5\' 5"'$  (1.651 m)  Weight: 115 lb (52.164 kg)  SpO2: 96%   Body mass index is 19.14 kg/(m^2).  General: A&O x 3, WD, thin  Pulmonary: Sym exp, good air movt, CTAB, no rales, rhonchi, & wheezing  Cardiac: irr, irregular; no Murmurs, rubs or gallops  Vascular: Vessel Right Left  Radial Palpable Palpable  Ulnar Not Palpable Not Palpable  Brachial Palpable  Palpable   Gastrointestinal: soft, NTND, no G/R, bo HSM, no masses, no CVAT B  Musculoskeletal: M/S 5/5 throughout , Extremities without  ischemic changes   Neurologic: Pain and light touch intact in extremities , Motor exam as listed above   Medical Decision Making  FLORAINE BUECHLER is a 80 y.o. female who presents with ESRD requiring hemodialysis.   Based on vein mapping and examination, this patient's permanent access options include: L staged BRVT  I had an extensive discussion with this patient in regards to the nature of access surgery, including risk, benefits, and alternatives.    The patient is aware that the risks of access surgery include but are not limited to: bleeding, infection, steal syndrome, nerve damage, ischemic monomelic neuropathy, failure of access to mature, and possible need for additional access procedures in the future.  The patient is aware that I construct transposition procedures in two stages, requiring a separate operation to complete the procedure.  The patient has agreed to proceed with the above procedure which will be scheduled Tuesday 29 NOV 16.   Adele Barthel, MD [facility] Office: [phone] Pager: [phone]  01/26/2015, 9:57 AM

## 2015-01-30 ENCOUNTER — Other Ambulatory Visit: Payer: Self-pay | Admitting: *Deleted

## 2015-01-30 NOTE — Patient Outreach (Signed)
Weekly transition of care call placed to member.  No answer, unable to leave message. Will await call back.  Will continue with calls next week as member will have dialysis on Wednesday and Friday.    Valente David, BSN, [place] Management  [facility] Care Manager [phone]

## 2015-02-05 ENCOUNTER — Encounter (HOSPITAL_COMMUNITY): Payer: Self-pay | Admitting: *Deleted

## 2015-02-05 MED ORDER — CHLORHEXIDINE GLUCONATE CLOTH 2 % EX PADS: 6.0000 | MEDICATED_PAD | Freq: Once | CUTANEOUS | Status: DC

## 2015-02-05 MED ORDER — SODIUM CHLORIDE 0.9 % IV SOLN
INTRAVENOUS | Status: DC
  Administered 2015-02-06: 09:00:00 via INTRAVENOUS

## 2015-02-05 MED ORDER — DEXTROSE 5 % IV SOLN
1.5000 g | INTRAVENOUS | Status: AC
  Administered 2015-02-06: 1.5 g via INTRAVENOUS
  Filled 2015-02-05: qty 1.5

## 2015-02-05 NOTE — Progress Notes (Signed)
Anesthesia Chart Review: SAME DAY WORK-UP.  Patient is a 81 year old female scheduled for first stage basilic vein transposition, LUE tomorrow by Dr. Bridgett Larsson. She was admitted to [facility] 01/01/15-01/10/15 for acute on chronic renal failure, acute on chronic diastolic CHF (in the setting of acute renal failure). She was seen by cardiologist Dr. Haroldine Laws for CHF, but ultimately required initiation of hemodialysis on 01/05/15. She now needs permanent HD access. Primary cardiologist is Dr. Einar Gip (records received ~ 4:50 PM today)  History includes PAF, CHF with recurrent right pleural effusion s/p right PleurX catheter and talc pleurodesis 09/27/14, ESRD on HD (MWF) s/p right IJ tunneled catheter 01/05/15, right breast cancer s/p right modified radical mastectomy and left total mastectomy and radiation '14, MGUS, right axillary/subclavian DVT 09/23/14 (persistent thrombus right [place] vein and occlusive thrombus right IJ vein 01/23/15 RUE venous duplex), former smoker, hypertension, left carotid bruit (no significant ICA stenosis 11/2013), hypercholesterolemia, GERD, gastric ulcer, anemia, diabetes mellitus type 2, hyperthyroidism/multinodular goiter s/p radioactive iodine '12 (11/16/14 CT surgeon Dr. Servando Snare, "because of her frail status and protein malnutrition, surgery to remove her goiter was NOT recommended"), vocal cord paralysis (suspected due to massive substernal goiter).   PCP is Dr. Glendale Chard.  Endocrinologist is Dr. Buddy Duty (to have out-patient follow-up per 01/10/15 discharge summary). She is on Tapazole. HEM-ONC Dr. Jana Hakim. Cardiologist is Dr. Einar Gip, last visit 01/23/15 with Neldon Labella, NP. By notes, since patient started hemodialysis "she has significantly improved." She ordered follow-up RUE venous duplex for persistent/worsening RUE swelling which showed right [place] vein occlusive thrombus and occlusive thrombus right IJ vein ("second episode at the dialysis catheter site in spite of INR being  supratherapeutic." Continued anticoagulation with warfarin recommended, but she was also considering getting input from hematology as well. Patient was seen by vascular surgery 01/26/15, and he instructed her to hold warfarin for 5 days preoperatively.    Meds include amiodarone, anastrozole, digoxin, Mephyton (date 11/17/14 ?), Tapazole, midodrine, thiamine, albuterol, warfarin (last dose 01/31/15).  01/23/15 EKG [facility] CV): NSR, LAD, LAFB, poor r wave progression, LVH with repolarization abnormality, cannot exclude lateral ischemia. No significant change from EKG 10/25/14.  01/02/15 Echo: Study Conclusions - Left ventricle: The cavity size was normal. Wall thickness was increased in a pattern of moderate LVH. Systolic function was normal. The estimated ejection fraction was in the range of 60% to 65%. Wall motion was normal; there were no regional wall motion abnormalities. - Aortic valve: There was mild regurgitation. Valve area (VTI): 1.87 cm^2. Valve area (Vmax): 1.8 cm^2. Valve area (Vmean): 1.88 cm^2. - Mitral valve: Valve area by continuity equation (using LVOT flow): 1.76 cm^2. - Left atrium: The atrium was moderately dilated. - Right atrium: The atrium was moderately dilated. - Pulmonary arteries: Systolic pressure was severely increased. PA peak pressure: 72 mm Hg (S). - Pericardium, extracardiac: A small, free-flowing pericardial effusion was identified circumferential to the heart. There was no evidence of hemodynamic compromise.  09/25/14 Nuclear stress test: IMPRESSION: 1. Small fixed defect at the inferior lateral wall. No reversible ischemia identified. 2. Normal left ventricular wall motion. 3. Left ventricular ejection fraction 62% 4. Low-risk stress test findings*.  12/06/13 Carotid duplex [facility] CV): No evidence of hemodynamically significant stenosis in bilateral carotid bifurcation vessels. Minimal soft plaque noted bilaterally.   01/05/15  1V CXR: FINDINGS: Stable cardiomediastinal silhouette. Large right superior mediastinal mass is unchanged. Interval placement of right internal jugular dialysis catheter with distal tips in the expected position of the SVC. No  pneumothorax is noted. Mild bilateral pleural effusions are noted. Bibasilar opacities are noted concerning for edema or atelectasis. Surgical clips are noted in the axillary regions bilaterally. Bony thorax is unremarkable. IMPRESSION: Interval placement of right internal jugular dialysis catheter with distal tips in the expected position of the SVC. No pneumothorax is noted. Stable large right superior mediastinal mass is noted. Mild bilateral pleural effusions are noted, with bibasilar opacities concerning for edema or atelectasis.  She is for labs on arrival.  Patient is a same day work-up, so she will need to be evaluated by the anesthesiologist on the day of surgery. She does have known RUE DVT since 09/2014. Since Dr. Bridgett Larsson has instructed her to hold warfarin, this will need to be resumed as soon as possible post-operatively. CHF is apparently doing better since starting HD. She does have a PleurX catheter, so will defer to the anesthesiologist if he/she feels a CXR needs to be repeated (last month she had only small effusions). Definitive plan following evaluation tomorrow.  George Hugh [facility] Short Stay Center/Anesthesiology Phone [phone] 02/05/2015 5:13 PM

## 2015-02-06 ENCOUNTER — Encounter (HOSPITAL_COMMUNITY)
Admission: RE | Disposition: A | Discharge status: Home / Self Care | Payer: Self-pay | Source: Ambulatory Visit | Attending: Vascular Surgery

## 2015-02-06 ENCOUNTER — Encounter (HOSPITAL_COMMUNITY): Payer: Self-pay | Admitting: *Deleted

## 2015-02-06 ENCOUNTER — Ambulatory Visit (HOSPITAL_COMMUNITY): Payer: Commercial Managed Care - HMO | Admitting: Vascular Surgery

## 2015-02-06 ENCOUNTER — Ambulatory Visit (HOSPITAL_COMMUNITY)
Admission: RE | Admit: 2015-02-06 | Discharge: 2015-02-06 | Disposition: A | Discharge status: Home / Self Care | Payer: Commercial Managed Care - HMO | Source: Ambulatory Visit | Attending: Vascular Surgery | Admitting: Vascular Surgery

## 2015-02-06 ENCOUNTER — Telehealth: Payer: Self-pay | Admitting: Vascular Surgery

## 2015-02-06 DIAGNOSIS — E1122 Type 2 diabetes mellitus with diabetic chronic kidney disease: Secondary | ICD-10-CM | POA: Insufficient documentation

## 2015-02-06 DIAGNOSIS — Z992 Dependence on renal dialysis: Secondary | ICD-10-CM | POA: Insufficient documentation

## 2015-02-06 DIAGNOSIS — I5032 Chronic diastolic (congestive) heart failure: Secondary | ICD-10-CM | POA: Insufficient documentation

## 2015-02-06 DIAGNOSIS — E7800 Pure hypercholesterolemia, unspecified: Secondary | ICD-10-CM | POA: Diagnosis not present

## 2015-02-06 DIAGNOSIS — Z87891 Personal history of nicotine dependence: Secondary | ICD-10-CM | POA: Insufficient documentation

## 2015-02-06 DIAGNOSIS — Z79899 Other long term (current) drug therapy: Secondary | ICD-10-CM | POA: Insufficient documentation

## 2015-02-06 DIAGNOSIS — K219 Gastro-esophageal reflux disease without esophagitis: Secondary | ICD-10-CM | POA: Insufficient documentation

## 2015-02-06 DIAGNOSIS — I480 Paroxysmal atrial fibrillation: Secondary | ICD-10-CM | POA: Diagnosis not present

## 2015-02-06 DIAGNOSIS — I120 Hypertensive chronic kidney disease with stage 5 chronic kidney disease or end stage renal disease: Secondary | ICD-10-CM | POA: Insufficient documentation

## 2015-02-06 DIAGNOSIS — N186 End stage renal disease: Secondary | ICD-10-CM | POA: Diagnosis not present

## 2015-02-06 DIAGNOSIS — Z7901 Long term (current) use of anticoagulants: Secondary | ICD-10-CM | POA: Insufficient documentation

## 2015-02-06 DIAGNOSIS — N185 Chronic kidney disease, stage 5: Secondary | ICD-10-CM | POA: Diagnosis not present

## 2015-02-06 HISTORY — PX: BASCILIC VEIN TRANSPOSITION: SHX5742

## 2015-02-06 HISTORY — DX: End stage renal disease: N18.6

## 2015-02-06 LAB — PROTIME-INR
INR: 1.18 (ref 0.00–1.49)
PROTHROMBIN TIME: 15.1 s (ref 11.6–15.2)

## 2015-02-06 LAB — POCT I-STAT 4, (NA,K, GLUC, HGB,HCT)
Glucose, Bld: 75 mg/dL (ref 65–99)
HEMATOCRIT: 32 % — AB (ref 36.0–46.0)
Hemoglobin: 10.9 g/dL — ABNORMAL LOW (ref 12.0–15.0)
Potassium: 4.3 mmol/L (ref 3.5–5.1)
Sodium: 136 mmol/L (ref 135–145)

## 2015-02-06 LAB — APTT: aPTT: 168 seconds — ABNORMAL HIGH (ref 24–37)

## 2015-02-06 SURGERY — TRANSPOSITION, VEIN, BASILIC
Anesthesia: Monitor Anesthesia Care | Laterality: Left

## 2015-02-06 MED ORDER — 0.9 % SODIUM CHLORIDE (POUR BTL) OPTIME
TOPICAL | Status: DC | PRN
  Administered 2015-02-06: 1000 mL

## 2015-02-06 MED ORDER — OXYCODONE HCL 5 MG/5ML PO SOLN: 5.0000 mg | Freq: Once | ORAL | Status: DC | PRN

## 2015-02-06 MED ORDER — THROMBIN 20000 UNITS EX SOLR
CUTANEOUS | Status: AC
  Filled 2015-02-06: qty 20000

## 2015-02-06 MED ORDER — EPHEDRINE SULFATE 50 MG/ML IJ SOLN
INTRAMUSCULAR | Status: AC
  Filled 2015-02-06: qty 1

## 2015-02-06 MED ORDER — PROPOFOL 10 MG/ML IV BOLUS
INTRAVENOUS | Status: AC
  Filled 2015-02-06: qty 20

## 2015-02-06 MED ORDER — PHENYLEPHRINE 40 MCG/ML (10ML) SYRINGE FOR IV PUSH (FOR BLOOD PRESSURE SUPPORT)
PREFILLED_SYRINGE | INTRAVENOUS | Status: AC
  Filled 2015-02-06: qty 10

## 2015-02-06 MED ORDER — FENTANYL CITRATE (PF) 100 MCG/2ML IJ SOLN
INTRAMUSCULAR | Status: DC | PRN
  Administered 2015-02-06: 50 ug via INTRAVENOUS

## 2015-02-06 MED ORDER — FENTANYL CITRATE (PF) 100 MCG/2ML IJ SOLN: 25.0000 ug | INTRAMUSCULAR | Status: DC | PRN

## 2015-02-06 MED ORDER — OXYCODONE-ACETAMINOPHEN 5-325 MG PO TABS: 1.0000 | ORAL_TABLET | Freq: Four times a day (QID) | ORAL | Status: DC | PRN

## 2015-02-06 MED ORDER — ONDANSETRON HCL 4 MG/2ML IJ SOLN
INTRAMUSCULAR | Status: AC
  Filled 2015-02-06: qty 4

## 2015-02-06 MED ORDER — FENTANYL CITRATE (PF) 250 MCG/5ML IJ SOLN
INTRAMUSCULAR | Status: AC
  Filled 2015-02-06: qty 5

## 2015-02-06 MED ORDER — THROMBIN 20000 UNITS EX KIT
PACK | CUTANEOUS | Status: AC
  Filled 2015-02-06: qty 1

## 2015-02-06 MED ORDER — LIDOCAINE HCL (CARDIAC) 20 MG/ML IV SOLN
INTRAVENOUS | Status: AC
  Filled 2015-02-06: qty 10

## 2015-02-06 MED ORDER — LIDOCAINE HCL (CARDIAC) 20 MG/ML IV SOLN
INTRAVENOUS | Status: AC
  Filled 2015-02-06: qty 5

## 2015-02-06 MED ORDER — LIDOCAINE HCL (PF) 1 % IJ SOLN
INTRAMUSCULAR | Status: DC | PRN
  Administered 2015-02-06: 3 mL

## 2015-02-06 MED ORDER — PHENYLEPHRINE HCL 10 MG/ML IJ SOLN
INTRAMUSCULAR | Status: DC | PRN
  Administered 2015-02-06: 40 ug via INTRAVENOUS
  Administered 2015-02-06: 80 ug via INTRAVENOUS
  Administered 2015-02-06: 40 ug via INTRAVENOUS

## 2015-02-06 MED ORDER — SODIUM CHLORIDE 0.9 % IJ SOLN
INTRAMUSCULAR | Status: AC
  Filled 2015-02-06: qty 10

## 2015-02-06 MED ORDER — OXYCODONE HCL 5 MG PO TABS: 5.0000 mg | ORAL_TABLET | Freq: Once | ORAL | Status: DC | PRN

## 2015-02-06 MED ORDER — SUCCINYLCHOLINE CHLORIDE 20 MG/ML IJ SOLN
INTRAMUSCULAR | Status: AC
  Filled 2015-02-06: qty 1

## 2015-02-06 MED ORDER — ONDANSETRON HCL 4 MG/2ML IJ SOLN: 4.0000 mg | Freq: Once | INTRAMUSCULAR | Status: DC | PRN

## 2015-02-06 MED ORDER — ROCURONIUM BROMIDE 50 MG/5ML IV SOLN
INTRAVENOUS | Status: AC
  Filled 2015-02-06: qty 2

## 2015-02-06 MED ORDER — LIDOCAINE HCL (PF) 1 % IJ SOLN
INTRAMUSCULAR | Status: AC
  Filled 2015-02-06: qty 30

## 2015-02-06 MED ORDER — SODIUM CHLORIDE 0.9 % IV SOLN
INTRAVENOUS | Status: DC | PRN
  Administered 2015-02-06: 10:00:00

## 2015-02-06 MED ORDER — PROPOFOL 500 MG/50ML IV EMUL
INTRAVENOUS | Status: DC | PRN
  Administered 2015-02-06: 55 ug/kg/min via INTRAVENOUS

## 2015-02-06 MED ORDER — HEPARIN SODIUM (PORCINE) 1000 UNIT/ML IJ SOLN
INTRAMUSCULAR | Status: AC
  Filled 2015-02-06: qty 1

## 2015-02-06 MED ORDER — EPHEDRINE SULFATE 50 MG/ML IJ SOLN
INTRAMUSCULAR | Status: DC | PRN
  Administered 2015-02-06: 10 mg via INTRAVENOUS

## 2015-02-06 MED ORDER — SODIUM CHLORIDE 0.9 % IV SOLN
INTRAVENOUS | Status: DC | PRN
  Administered 2015-02-06 (×2): via INTRAVENOUS

## 2015-02-06 SURGICAL SUPPLY — 41 items
CANISTER SUCTION 2500CC (MISCELLANEOUS) ×3 IMPLANT
CLIP TI MEDIUM 24 (CLIP) ×1 IMPLANT
CLIP TI MEDIUM 6 (CLIP) ×2 IMPLANT
CLIP TI WIDE RED SMALL 24 (CLIP) ×1 IMPLANT
CLIP TI WIDE RED SMALL 6 (CLIP) ×2 IMPLANT
CORDS BIPOLAR (ELECTRODE) IMPLANT
COVER PROBE W GEL 5X96 (DRAPES) ×2 IMPLANT
DECANTER SPIKE VIAL GLASS SM (MISCELLANEOUS) ×1 IMPLANT
DRSG COVADERM 4X10 (GAUZE/BANDAGES/DRESSINGS) IMPLANT
DRSG COVADERM 4X8 (GAUZE/BANDAGES/DRESSINGS) IMPLANT
ELECT REM PT RETURN 9FT ADLT (ELECTROSURGICAL) ×3
ELECTRODE REM PT RTRN 9FT ADLT (ELECTROSURGICAL) ×1 IMPLANT
GLOVE BIO SURGEON STRL SZ 6.5 (GLOVE) ×1 IMPLANT
GLOVE BIO SURGEON STRL SZ7 (GLOVE) ×3 IMPLANT
GLOVE BIO SURGEONS STRL SZ 6.5 (GLOVE) ×1
GLOVE BIOGEL PI IND STRL 6.5 (GLOVE) IMPLANT
GLOVE BIOGEL PI IND STRL 7.5 (GLOVE) ×1 IMPLANT
GLOVE BIOGEL PI INDICATOR 6.5 (GLOVE) ×8
GLOVE BIOGEL PI INDICATOR 7.5 (GLOVE) ×2
GLOVE SS BIOGEL STRL SZ 6.5 (GLOVE) IMPLANT
GLOVE SUPERSENSE BIOGEL SZ 6.5 (GLOVE) ×2
GOWN STRL REUS W/ TWL LRG LVL3 (GOWN DISPOSABLE) ×3 IMPLANT
GOWN STRL REUS W/TWL LRG LVL3 (GOWN DISPOSABLE) ×9
KIT BASIN OR (CUSTOM PROCEDURE TRAY) ×3 IMPLANT
KIT ROOM TURNOVER OR (KITS) ×3 IMPLANT
LIQUID BAND (GAUZE/BANDAGES/DRESSINGS) ×3 IMPLANT
NS IRRIG 1000ML POUR BTL (IV SOLUTION) ×3 IMPLANT
PACK CV ACCESS (CUSTOM PROCEDURE TRAY) ×3 IMPLANT
PAD ARMBOARD 7.5X6 YLW CONV (MISCELLANEOUS) ×6 IMPLANT
SPONGE SURGIFOAM ABS GEL 100 (HEMOSTASIS) IMPLANT
STAPLER VISISTAT 35W (STAPLE) IMPLANT
SUT MNCRL AB 4-0 PS2 18 (SUTURE) ×3 IMPLANT
SUT PROLENE 6 0 BV (SUTURE) ×3 IMPLANT
SUT PROLENE 7 0 BV 1 (SUTURE) ×2 IMPLANT
SUT SILK 2 0 SH (SUTURE) ×1 IMPLANT
SUT VIC AB 2-0 CT1 27 (SUTURE)
SUT VIC AB 2-0 CT1 TAPERPNT 27 (SUTURE) ×1 IMPLANT
SUT VIC AB 3-0 SH 27 (SUTURE) ×3
SUT VIC AB 3-0 SH 27X BRD (SUTURE) ×2 IMPLANT
UNDERPAD 30X30 INCONTINENT (UNDERPADS AND DIAPERS) ×3 IMPLANT
WATER STERILE IRR 1000ML POUR (IV SOLUTION) ×3 IMPLANT

## 2015-02-06 NOTE — Transfer of Care (Signed)
Immediate Anesthesia Transfer of Care Note  Patient: Alison Gross  Procedure(s) Performed: Procedure(s): FIRST STAGE BRACHIAL VEIN TRANSPOSITION (Left)  Patient Location: PACU  Anesthesia Type:MAC  Level of Consciousness: awake, alert  and oriented  Airway & Oxygen Therapy: Patient Spontanous Breathing  Post-op Assessment: Report given to RN, Post -op Vital signs reviewed and stable and Patient moving all extremities X 4  Post vital signs: Reviewed and stable  Last Vitals:  Filed Vitals:   02/06/15 0714  BP: 101/39  Pulse: 65  Temp: 36.3 C  Resp: 20    Complications: No apparent anesthesia complications

## 2015-02-06 NOTE — H&P (View-Only) (Signed)
Established Dialysis Access  History of Present Illness  Alison Gross is a 81 y.o. (1933/01/27) female who presents for re-evaluation for permanent access.  The patient is right hand dominant.  No previous access procedures have been completed.  The patient has never had a previous PPM placed.  She currently has a RIJV TDC.  I saw her in the hospital when he SBP were chronically in 80s.  I recommending letting her heart recover before consider permanent access placement.  Past Medical History  Diagnosis Date  . Hypercholesterolemia     takes Crestor daily  . Thyroid disease     had iodine radiation  . Hypertension     takes Hyzaar daily  . Dysrhythmia     takes Carvedilol daily  . Pneumonia     history of;last time about 4-71yr ago  . GERD (gastroesophageal reflux disease)     takes Protonix daily  . Gastric ulcer   . History of blood transfusion   . History of colon polyps   . Anemia     takes iron pill daily  . Diabetes mellitus without complication ([facility]     takes Tradjenta daily  . History of radiation therapy 07/12/12-08/26/12    right breast/  . Paroxysmal atrial fibrillation ([facility]     PCP EKG 09/27/2013: A. Fibrillation.. EKG 09/29/2013: S. Tach.  . Breast cancer ([facility] 04/12/12    right-pos lymph node/left-DCIS  . Shortness of breath on exertion   . Bruit of left carotid artery   . Acute upper GI bleeding 01/24/2012  . MGUS (monoclonal gammopathy of unknown significance) 04/21/2013  . Osteoporosis 11/29/2013  . Recurrent right pleural effusion 07/26/2014  . Stage III chronic kidney disease 07/26/2014  . Goiter 07/27/2014  . Substernal goiter 12/23/2012  . Pleural effusion, right 12/23/2012  . Type 2 diabetes mellitus with renal manifestations ([facility] 07/26/2014  . Dysphonia 09/20/2014  . Vocal cord paralysis     Suspected due to massive substernal goiter  . CHF (congestive heart failure) ([facility]     Past Surgical History  Procedure Laterality Date  . Carpal tunnel  release Left   . Breast biopsy    . Esophagogastroduodenoscopy  01/24/2012    Procedure: ESOPHAGOGASTRODUODENOSCOPY (EGD);  Surgeon: MJeryl Columbia MD;  [facility];  Service: Endoscopy;  Laterality: N/A;  . Thyroid surgery      "removed goiter"  . Cystectomy      from back of neck  . Knee surgery      right with rods  . Esophagogastroduodenoscopy    . Colonoscopy    . Total mastectomy Bilateral 05/10/2012    Procedure: RIGHT modified mastectomy; LEFT total mastectomy;  Surgeon: CHaywood Lasso MD;  [place]  Service: General;  Laterality: Bilateral;  . Axillary sentinel node biopsy Right 05/10/2012    Procedure: AXILLARY SENTINEL lymph  NODE BIOPSY;  Surgeon: CHaywood Lasso MD;  Location[place] [facility]  Service: General;  Laterality: Right;  nuclear medicine injection right side  7:00 am   . Abdominal hysterectomy      fibroids, with bilateral SO  . Chest tube insertion Right 09/27/2014    Procedure: INSERTION OF RIGHT PLEURAL DRAINAGE CATHETER;  Surgeon: EGrace Isaac MD;  [place]  Service: Thoracic;  Laterality: Right;  . Insertion of dialysis catheter N/A 01/05/2015    Procedure: INSERTION OF DIALYSIS CATHETER;  Surgeon: CAngelia Mould MD;  [place]  Service: Vascular;  Laterality: N/A;  Social History   Social History  . Marital Status: Married    Spouse Name: [name]  . Number of Children: 2  . Years of Education: N/A   Occupational History  . Not on file.   Social History Main Topics  . Smoking status: Former Smoker -- 0.00 packs/day    Types: Cigarettes    Quit date: 07/26/1971  . Smokeless tobacco: Never Used  . Alcohol Use: No  . Drug Use: No  . Sexual Activity: Not Currently    Birth Control/ Protection: Surgical     Comment: menarche age 23,1st live birth 33, P2, no HRT   Other Topics Concern  . Not on file   Social History Narrative   Married.  Ambulates independently.      Family History  Problem Relation  Age of Onset  . Brain cancer Mother   . Aneurysm Father   . Diabetes Sister   . Diabetes Brother   . Diabetes Sister   . Diabetes Brother   . Heart failure Sister      Current Outpatient Prescriptions  Medication Sig Dispense Refill  . acetaminophen (TYLENOL) 325 MG tablet Take 2 tablets (650 mg total) by mouth every 4 (four) hours as needed for headache or mild pain.    Marland Kitchen anastrozole (ARIMIDEX) 1 MG tablet TAKE 1 TABLET (1 MG TOTAL) BY MOUTH DAILY. 30 tablet 5  . digoxin (LANOXIN) 0.125 MG tablet Please Take one tablet oral daily from Monday to Friday, not on Saturday or Sunday. 30 tablet 0  . methimazole (TAPAZOLE) 10 MG tablet Take 2 tablets (20 mg total) by mouth daily. 60 tablet 0  . midodrine (PROAMATINE) 10 MG tablet Take 1 tablet (10 mg total) by mouth 3 (three) times daily with meals. 90 tablet 1  . Multiple Vitamin (MULTIVITAMIN WITH MINERALS) TABS Take 1 tablet by mouth every other day.     . Nutritional Supplements (FEEDING SUPPLEMENT, NEPRO CARB STEADY,) LIQD Take 237 mLs by mouth 3 (three) times daily with meals. 24 Can 0  . senna-docusate (SENOKOT-S) 8.6-50 MG tablet Take 1 tablet by mouth 2 (two) times daily. 30 tablet 0  . thiamine 100 MG tablet Take 1 tablet (100 mg total) by mouth daily. 90 tablet 0  . VENTOLIN HFA 108 (90 BASE) MCG/ACT inhaler Inhale 2 puffs into the lungs every 4 (four) hours as needed for wheezing or shortness of breath.   0  . warfarin (COUMADIN) 2 MG tablet Take 1 tablet (2 mg total) by mouth daily at 6 PM. Take '4mg'$  on 11/2 and 11/3; then resume prior home dose of '2mg'$  daily  0  . amiodarone (PACERONE) 200 MG tablet     . atorvastatin (LIPITOR) 10 MG tablet     . BAYER CONTOUR NEXT TEST test strip     . ferrous sulfate 325 (65 FE) MG tablet Take 1 tablet (325 mg total) by mouth 3 (three) times daily with meals. (Patient not taking: Reported on 01/23/2015) 90 tablet 0  . furosemide (LASIX) 40 MG tablet     . MEPHYTON 5 MG tablet     . metoprolol  tartrate (LOPRESSOR) 25 MG tablet     . MICROLET LANCETS MISC     . multivitamin (RENA-VIT) TABS tablet Take 1 tablet by mouth at bedtime. (Patient not taking: Reported on 01/23/2015) 30 tablet 0  . torsemide (DEMADEX) 20 MG tablet     . warfarin (COUMADIN) 1 MG tablet      No current facility-administered  medications for this visit.     No Known Allergies   REVIEW OF SYSTEMS:  (Positives checked otherwise negative)  CARDIOVASCULAR:   '[ ]'$  chest pain,  '[ ]'$  chest pressure,  '[ ]'$  palpitations,  '[ ]'$  shortness of breath when laying flat,  '[ ]'$  shortness of breath with exertion,   '[ ]'$  pain in feet when walking,  '[ ]'$  pain in feet when laying flat, '[ ]'$  history of blood clot in veins (DVT),  '[ ]'$  history of phlebitis,  '[ ]'$  swelling in legs,  '[ ]'$  varicose veins  PULMONARY:   '[ ]'$  productive cough,  '[ ]'$  asthma,  '[ ]'$  wheezing  NEUROLOGIC:   '[ ]'$  weakness in arms or legs,  '[ ]'$  numbness in arms or legs,  '[ ]'$  difficulty speaking or slurred speech,  '[ ]'$  temporary loss of vision in one eye,  '[ ]'$  dizziness  HEMATOLOGIC:   '[ ]'$  bleeding problems,  '[ ]'$  problems with blood clotting too easily  MUSCULOSKEL:   '[ ]'$  joint pain, '[ ]'$  joint swelling  GASTROINTEST:   '[ ]'$  vomiting blood,  '[ ]'$  blood in stool     GENITOURINARY:   '[ ]'$  burning with urination,  '[ ]'$  blood in urine '[x]'$  ESRD-HD: M-W-F  PSYCHIATRIC:   '[ ]'$  history of major depression  INTEGUMENTARY:   '[ ]'$  rashes,  '[ ]'$  ulcers  CONSTITUTIONAL:   '[ ]'$  fever,  '[ ]'$  chills     Physical Examination  Filed Vitals:   01/26/15 0919  BP: 130/76  Pulse: 71  Height: '5\' 5"'$  (1.651 m)  Weight: 115 lb (52.164 kg)  SpO2: 96%   Body mass index is 19.14 kg/(m^2).  General: A&O x 3, WD, thin  Pulmonary: Sym exp, good air movt, CTAB, no rales, rhonchi, & wheezing  Cardiac: irr, irregular; no Murmurs, rubs or gallops  Vascular: Vessel Right Left  Radial Palpable Palpable  Ulnar Not Palpable Not Palpable  Brachial Palpable  Palpable   Gastrointestinal: soft, NTND, no G/R, bo HSM, no masses, no CVAT B  Musculoskeletal: M/S 5/5 throughout , Extremities without  ischemic changes   Neurologic: Pain and light touch intact in extremities , Motor exam as listed above   Medical Decision Making  Alison Gross is a 81 y.o. female who presents with ESRD requiring hemodialysis.   Based on vein mapping and examination, this patient's permanent access options include: L staged BRVT  I had an extensive discussion with this patient in regards to the nature of access surgery, including risk, benefits, and alternatives.    The patient is aware that the risks of access surgery include but are not limited to: bleeding, infection, steal syndrome, nerve damage, ischemic monomelic neuropathy, failure of access to mature, and possible need for additional access procedures in the future.  The patient is aware that I construct transposition procedures in two stages, requiring a separate operation to complete the procedure.  The patient has agreed to proceed with the above procedure which will be scheduled Tuesday 29 NOV 16.   Adele Barthel, MD [facility] [place] [place] Office: [phone] Pager: [phone]  01/26/2015, 9:57 AM

## 2015-02-06 NOTE — Discharge Instructions (Signed)
° ° °  02/06/2015 Alison Gross [id number] January 08, 1933  Surgeon(s): Conrad Fraser, MD  Procedure(s): FIRST STAGE BRACHIAL VEIN TRANSPOSITION  x Do not stick graft for 12 weeks

## 2015-02-06 NOTE — Telephone Encounter (Signed)
Spoke with pt, dpm °

## 2015-02-06 NOTE — Anesthesia Preprocedure Evaluation (Addendum)
Anesthesia Evaluation  Patient identified by MRN, date of birth, ID band Patient awake    Reviewed: Allergy & Precautions, NPO status , Patient's Chart, lab work & pertinent test results  Airway Mallampati: II  TM Distance: >3 FB     Dental  (+) Edentulous Upper, Edentulous Lower, Dental Advidsory Given   Pulmonary shortness of breath and with exertion, former smoker,    breath sounds clear to auscultation       Cardiovascular hypertension,  Rhythm:Regular Rate:Normal     Neuro/Psych    GI/Hepatic   Endo/Other  diabetes  Renal/GU      Musculoskeletal   Abdominal   Peds  Hematology   Anesthesia Other Findings   Reproductive/Obstetrics                            Anesthesia Physical Anesthesia Plan  ASA: III  Anesthesia Plan: MAC   Post-op Pain Management:    Induction: Intravenous  Airway Management Planned: Natural Airway and Simple Face Mask  Additional Equipment:   Intra-op Plan:   Post-operative Plan:   Informed Consent: I have reviewed the patients History and Physical, chart, labs and discussed the procedure including the risks, benefits and alternatives for the proposed anesthesia with the patient or authorized representative who has indicated his/her understanding and acceptance.   Dental Advisory Given  Plan Discussed with: CRNA, Anesthesiologist and Surgeon  Anesthesia Plan Comments: (ESRD last HD 11/28 K-4.3 Htn Type 2 DM glucose 75  H/O Paroxysmal Afib  Plan MAC)       Anesthesia Quick Evaluation

## 2015-02-06 NOTE — Interval H&P Note (Signed)
History and Physical Interval Note:  02/06/2015 7:23 AM  Alison Gross  has presented today for surgery, with the diagnosis of End Stage Renal Disease N18.6  The various methods of treatment have been discussed with the patient and family. After consideration of risks, benefits and other options for treatment, the patient has consented to  Procedure(s): [place] (Left) as a surgical intervention .  The patient's history has been reviewed, patient examined, no change in status, stable for surgery.  I have reviewed the patient's chart and labs.  Questions were answered to the patient's satisfaction.     Adele Barthel

## 2015-02-06 NOTE — Anesthesia Procedure Notes (Signed)
Procedure Name: MAC Date/Time: 02/06/2015 10:00 AM Performed by: Neldon Newport Pre-anesthesia Checklist: Patient being monitored, Suction available, Emergency Drugs available, Patient identified and Timeout performed Patient Re-evaluated:Patient Re-evaluated prior to inductionOxygen Delivery Method: Simple face mask Placement Confirmation: positive ETCO2 Dental Injury: Teeth and Oropharynx as per pre-operative assessment

## 2015-02-06 NOTE — Progress Notes (Signed)
Dr. [name] Caprice @ bedside speaking to patient at this time.

## 2015-02-06 NOTE — Anesthesia Postprocedure Evaluation (Signed)
Anesthesia Post Note  Patient: Alison Gross  Procedure(s) Performed: Procedure(s) (LRB): FIRST STAGE BRACHIAL VEIN TRANSPOSITION (Left)  Patient location during evaluation: PACU Anesthesia Type: MAC Level of consciousness: awake and awake and alert Pain management: pain level controlled Vital Signs Assessment: post-procedure vital signs reviewed and stable Respiratory status: spontaneous breathing Anesthetic complications: no    Last Vitals:  Filed Vitals:   02/06/15 1149 02/06/15 1212  BP: 128/52 132/52  Pulse:    Temp: 36.2 C   Resp:      Last Pain: There were no vitals filed for this visit.               Madesyn Ast COKER

## 2015-02-06 NOTE — Op Note (Signed)
OPERATIVE NOTE   PROCEDURE: 1. left first stage brachial vein transposition (brachiobrachial arteriovenous fistula) placement  PRE-OPERATIVE DIAGNOSIS: chronic kidney disease stage V   POST-OPERATIVE DIAGNOSIS: same as above   SURGEON: Adele Barthel, MD  ASSISTANT(S): Silva Bandy, PAC   ANESTHESIA: local and MAC  ESTIMATED BLOOD LOSS: 50 cc  FINDING(S): 1. Palpable thrill and radial pulse at end of case  SPECIMEN(S):  none  INDICATIONS:   Alison Gross is a 81 y.o. female who presents with chronic kidney disease stage V.  The patient is scheduled for left first stage brachial vein transposition.  The patient is aware the risks include but are not limited to: bleeding, infection, steal syndrome, nerve damage, ischemic monomelic neuropathy, failure to mature, and need for additional procedures.  The patient is aware of the risks of the procedure and elects to proceed forward.  DESCRIPTION: After full informed written consent was obtained from the patient, the patient was brought back to the operating room and placed supine upon the operating table.  Prior to induction, the patient received IV antibiotics.   After obtaining adequate anesthesia, the patient was then prepped and draped in the standard fashion for a left arm access procedure.  I turned my attention first to identifying the patient's brachial vein and brachial artery.  Using SonoSite guidance, the location of these vessels were marked out on the skin.   At this point, I injected local anesthetic to obtain a field block of the antecubitum.  In total, I injected about 5 mL of 1% lidocaine without epinephrine.  I made a longitudinal incision at the level of the antecubitum and dissected through the subcutaneous tissue and fascia to gain exposure of the brachial artery.  This was noted to be 2.5-3.0 mm in diameter externally.  This was dissected out proximally and distally and controlled with vessel loops .  I then dissected out  the brachial vein.  This was noted to be 3.0-4.0 mm in diameter externally.  The distal segment of the vein was ligated with a  2-0 silk, and the vein was transected.  The proximal segment was iinterrogated with serial dilators.  The vein accepted up to a 4 mm dilator without any difficulty.  I then instilled the heparinized saline into the vein and clamped it.  At this point, I reset my exposure of the brachial artery and placed the artery under tension proximally and distally.  I made an arteriotomy with a #11 blade, and then I extended the arteriotomy with a Potts scissor.  I injected heparinized saline proximal and distal to this arteriotomy.  The vein was then sewn to the artery in an end-to-side configuration with a running stitch of 7-0 Prolene.  Prior to completing this anastomosis, I allowed the vein and artery to backbleed.  There was no evidence of clot from any vessels.  I completed the anastomosis in the usual fashion and then released all vessel loops and clamps.  There was a palpable thrill in the venous outflow, and there was a palpable radial pulse.  At this point, I irrigated out the surgical wound.  There was no further active bleeding.  The subcutaneous tissue was reapproximated with a running stitch of 3-0 Vicryl.  The skin was then reapproximated with a running subcuticular stitch of 4-0 Vicryl.  The skin was then cleaned, dried, and reinforced with Dermabond.  The patient tolerated this procedure well.    COMPLICATIONS: none  CONDITION: stable   Adele Barthel, MD  [facility] [place] Office: [phone] Pager: [phone]  02/06/2015, 11:00 AM

## 2015-02-06 NOTE — Progress Notes (Signed)
Unable to draw blood or start IV due to bilateral limb restriction, notified Dr. [name] Caprice, Dr. [name] Caprice on the way to see patient @ this time.

## 2015-02-06 NOTE — Telephone Encounter (Signed)
-----   Message from Mena Goes, RN sent at 02/06/2015 11:12 AM EST ----- Regarding: schedule   ----- Message -----    From: Conrad Yoakum, MD    Sent: 02/06/2015  11:01 AM      To: [facility] [address]  ORLA ESTRIN [id number] 11/02/1932  Procedure: L 1st brachial vein transposition   Asst: Silva Bandy, [facility]   Follow-up: 6 weeks

## 2015-02-07 ENCOUNTER — Encounter (HOSPITAL_COMMUNITY): Payer: Self-pay | Admitting: Vascular Surgery

## 2015-02-07 LAB — GLUCOSE, CAPILLARY
GLUCOSE-CAPILLARY: 66 mg/dL (ref 65–99)
Glucose-Capillary: 66 mg/dL (ref 65–99)
Glucose-Capillary: 72 mg/dL (ref 65–99)

## 2015-02-08 ENCOUNTER — Ambulatory Visit (INDEPENDENT_AMBULATORY_CARE_PROVIDER_SITE_OTHER): Payer: Commercial Managed Care - HMO | Admitting: Cardiothoracic Surgery

## 2015-02-08 ENCOUNTER — Ambulatory Visit
Admission: RE | Admit: 2015-02-08 | Discharge: 2015-02-08 | Disposition: A | Discharge status: Home / Self Care | Payer: Commercial Managed Care - HMO | Source: Ambulatory Visit | Attending: Cardiothoracic Surgery | Admitting: Cardiothoracic Surgery

## 2015-02-08 ENCOUNTER — Encounter: Payer: Self-pay | Admitting: Cardiothoracic Surgery

## 2015-02-08 ENCOUNTER — Telehealth: Payer: Self-pay

## 2015-02-08 ENCOUNTER — Other Ambulatory Visit: Payer: Self-pay | Admitting: *Deleted

## 2015-02-08 ENCOUNTER — Other Ambulatory Visit: Payer: Self-pay | Admitting: Cardiothoracic Surgery

## 2015-02-08 VITALS — BP 109/56 | HR 79 | Resp 16 | Ht 65.0 in | Wt 115.0 lb

## 2015-02-08 DIAGNOSIS — J948 Other specified pleural conditions: Secondary | ICD-10-CM

## 2015-02-08 DIAGNOSIS — J90 Pleural effusion, not elsewhere classified: Secondary | ICD-10-CM

## 2015-02-08 LAB — GLUCOSE, CAPILLARY: Glucose-Capillary: 44 mg/dL — CL (ref 65–99)

## 2015-02-08 NOTE — Patient Outreach (Signed)
Weekly transition of care call placed to member.  No answer, HIPPA compliant voice message left.  Will await call back.  Valente David, BSN, [facility]  [facility] Care Manager [phone]

## 2015-02-08 NOTE — Telephone Encounter (Signed)
Pt. notified to resume her Warfarin at the prior dose, and to notify the MD that manages the Warfarin for further instructions.  Pt. verb. understanding.

## 2015-02-08 NOTE — Telephone Encounter (Signed)
-----   Message from Conrad [place], MD sent at 02/08/2015  1:53 PM EST ----- Regarding: RE: resumption of Warfarin Resume at prior dose and mgmt per her prior arrangement  ----- Message -----    From: Denman George, RN    Sent: 02/08/2015   1:10 PM      To: Conrad Doyle, MD Subject: resumption of Warfarin                         S/p left first stage brachial vein transposition 11/29.  She did not receive instructions on resuming Warfarin.  Should she restart at 5 mg qd and have PT/INR rechecked in 4 days?  What is your recommendation?

## 2015-02-09 ENCOUNTER — Emergency Department (HOSPITAL_COMMUNITY): Payer: Commercial Managed Care - HMO

## 2015-02-09 ENCOUNTER — Encounter (HOSPITAL_COMMUNITY): Payer: Self-pay

## 2015-02-09 ENCOUNTER — Inpatient Hospital Stay (HOSPITAL_COMMUNITY)
Admission: EM | Admit: 2015-02-09 | Discharge: 2015-02-19 | DRG: 186 | Disposition: A | Discharge status: Hospice | Payer: Commercial Managed Care - HMO | Attending: Internal Medicine | Admitting: Internal Medicine

## 2015-02-09 DIAGNOSIS — Z1501 Genetic susceptibility to malignant neoplasm of breast: Secondary | ICD-10-CM

## 2015-02-09 DIAGNOSIS — Z992 Dependence on renal dialysis: Secondary | ICD-10-CM

## 2015-02-09 DIAGNOSIS — D638 Anemia in other chronic diseases classified elsewhere: Secondary | ICD-10-CM | POA: Diagnosis present

## 2015-02-09 DIAGNOSIS — I5033 Acute on chronic diastolic (congestive) heart failure: Secondary | ICD-10-CM | POA: Diagnosis present

## 2015-02-09 DIAGNOSIS — E0500 Thyrotoxicosis with diffuse goiter without thyrotoxic crisis or storm: Secondary | ICD-10-CM | POA: Diagnosis present

## 2015-02-09 DIAGNOSIS — Z8249 Family history of ischemic heart disease and other diseases of the circulatory system: Secondary | ICD-10-CM

## 2015-02-09 DIAGNOSIS — C50912 Malignant neoplasm of unspecified site of left female breast: Secondary | ICD-10-CM | POA: Diagnosis present

## 2015-02-09 DIAGNOSIS — R0781 Pleurodynia: Secondary | ICD-10-CM | POA: Diagnosis not present

## 2015-02-09 DIAGNOSIS — R7989 Other specified abnormal findings of blood chemistry: Secondary | ICD-10-CM | POA: Diagnosis not present

## 2015-02-09 DIAGNOSIS — R071 Chest pain on breathing: Secondary | ICD-10-CM | POA: Diagnosis not present

## 2015-02-09 DIAGNOSIS — I480 Paroxysmal atrial fibrillation: Secondary | ICD-10-CM | POA: Diagnosis present

## 2015-02-09 DIAGNOSIS — E854 Organ-limited amyloidosis: Secondary | ICD-10-CM

## 2015-02-09 DIAGNOSIS — Z87891 Personal history of nicotine dependence: Secondary | ICD-10-CM

## 2015-02-09 DIAGNOSIS — E778 Other disorders of glycoprotein metabolism: Secondary | ICD-10-CM | POA: Diagnosis present

## 2015-02-09 DIAGNOSIS — N08 Glomerular disorders in diseases classified elsewhere: Secondary | ICD-10-CM

## 2015-02-09 DIAGNOSIS — Z7901 Long term (current) use of anticoagulants: Secondary | ICD-10-CM

## 2015-02-09 DIAGNOSIS — Z808 Family history of malignant neoplasm of other organs or systems: Secondary | ICD-10-CM

## 2015-02-09 DIAGNOSIS — Z923 Personal history of irradiation: Secondary | ICD-10-CM

## 2015-02-09 DIAGNOSIS — Z79811 Long term (current) use of aromatase inhibitors: Secondary | ICD-10-CM

## 2015-02-09 DIAGNOSIS — K219 Gastro-esophageal reflux disease without esophagitis: Secondary | ICD-10-CM | POA: Diagnosis present

## 2015-02-09 DIAGNOSIS — J90 Pleural effusion, not elsewhere classified: Secondary | ICD-10-CM | POA: Diagnosis not present

## 2015-02-09 DIAGNOSIS — R627 Adult failure to thrive: Secondary | ICD-10-CM | POA: Diagnosis present

## 2015-02-09 DIAGNOSIS — R222 Localized swelling, mass and lump, trunk: Secondary | ICD-10-CM | POA: Diagnosis present

## 2015-02-09 DIAGNOSIS — N2581 Secondary hyperparathyroidism of renal origin: Secondary | ICD-10-CM | POA: Diagnosis present

## 2015-02-09 DIAGNOSIS — N186 End stage renal disease: Secondary | ICD-10-CM | POA: Diagnosis present

## 2015-02-09 DIAGNOSIS — J3800 Paralysis of vocal cords and larynx, unspecified: Secondary | ICD-10-CM | POA: Diagnosis present

## 2015-02-09 DIAGNOSIS — R001 Bradycardia, unspecified: Secondary | ICD-10-CM | POA: Diagnosis present

## 2015-02-09 DIAGNOSIS — I9589 Other hypotension: Secondary | ICD-10-CM | POA: Diagnosis present

## 2015-02-09 DIAGNOSIS — E1122 Type 2 diabetes mellitus with diabetic chronic kidney disease: Secondary | ICD-10-CM | POA: Diagnosis present

## 2015-02-09 DIAGNOSIS — I482 Chronic atrial fibrillation: Secondary | ICD-10-CM | POA: Diagnosis present

## 2015-02-09 DIAGNOSIS — E7800 Pure hypercholesterolemia, unspecified: Secondary | ICD-10-CM | POA: Diagnosis present

## 2015-02-09 DIAGNOSIS — I959 Hypotension, unspecified: Secondary | ICD-10-CM | POA: Diagnosis present

## 2015-02-09 DIAGNOSIS — E859 Amyloidosis, unspecified: Secondary | ICD-10-CM | POA: Diagnosis present

## 2015-02-09 DIAGNOSIS — Z515 Encounter for palliative care: Secondary | ICD-10-CM | POA: Insufficient documentation

## 2015-02-09 DIAGNOSIS — M810 Age-related osteoporosis without current pathological fracture: Secondary | ICD-10-CM | POA: Diagnosis present

## 2015-02-09 DIAGNOSIS — Z9013 Acquired absence of bilateral breasts and nipples: Secondary | ICD-10-CM

## 2015-02-09 DIAGNOSIS — I313 Pericardial effusion (noninflammatory): Secondary | ICD-10-CM | POA: Diagnosis present

## 2015-02-09 DIAGNOSIS — E440 Moderate protein-calorie malnutrition: Secondary | ICD-10-CM | POA: Diagnosis present

## 2015-02-09 DIAGNOSIS — I132 Hypertensive heart and chronic kidney disease with heart failure and with stage 5 chronic kidney disease, or end stage renal disease: Secondary | ICD-10-CM | POA: Diagnosis present

## 2015-02-09 DIAGNOSIS — Z833 Family history of diabetes mellitus: Secondary | ICD-10-CM

## 2015-02-09 DIAGNOSIS — Z66 Do not resuscitate: Secondary | ICD-10-CM | POA: Diagnosis present

## 2015-02-09 DIAGNOSIS — Z681 Body mass index (BMI) 19 or less, adult: Secondary | ICD-10-CM

## 2015-02-09 DIAGNOSIS — Z79899 Other long term (current) drug therapy: Secondary | ICD-10-CM

## 2015-02-09 DIAGNOSIS — R059 Cough, unspecified: Secondary | ICD-10-CM

## 2015-02-09 DIAGNOSIS — I455 Other specified heart block: Secondary | ICD-10-CM | POA: Diagnosis present

## 2015-02-09 DIAGNOSIS — R05 Cough: Secondary | ICD-10-CM

## 2015-02-09 DIAGNOSIS — L8993 Pressure ulcer of unspecified site, stage 3: Secondary | ICD-10-CM | POA: Diagnosis present

## 2015-02-09 DIAGNOSIS — E11649 Type 2 diabetes mellitus with hypoglycemia without coma: Secondary | ICD-10-CM | POA: Diagnosis present

## 2015-02-09 DIAGNOSIS — I272 Other secondary pulmonary hypertension: Secondary | ICD-10-CM | POA: Diagnosis present

## 2015-02-09 DIAGNOSIS — K5900 Constipation, unspecified: Secondary | ICD-10-CM | POA: Diagnosis not present

## 2015-02-09 DIAGNOSIS — C50911 Malignant neoplasm of unspecified site of right female breast: Secondary | ICD-10-CM | POA: Diagnosis present

## 2015-02-09 DIAGNOSIS — R778 Other specified abnormalities of plasma proteins: Secondary | ICD-10-CM | POA: Diagnosis present

## 2015-02-09 DIAGNOSIS — R011 Cardiac murmur, unspecified: Secondary | ICD-10-CM | POA: Diagnosis present

## 2015-02-09 DIAGNOSIS — K259 Gastric ulcer, unspecified as acute or chronic, without hemorrhage or perforation: Secondary | ICD-10-CM | POA: Diagnosis present

## 2015-02-09 DIAGNOSIS — D472 Monoclonal gammopathy: Secondary | ICD-10-CM | POA: Diagnosis present

## 2015-02-09 DIAGNOSIS — R079 Chest pain, unspecified: Secondary | ICD-10-CM | POA: Diagnosis not present

## 2015-02-09 LAB — CBC WITH DIFFERENTIAL/PLATELET
BASOS PCT: 1 %
Basophils Absolute: 0 10*3/uL (ref 0.0–0.1)
Eosinophils Absolute: 0.8 10*3/uL — ABNORMAL HIGH (ref 0.0–0.7)
Eosinophils Relative: 10 %
HEMATOCRIT: 31.4 % — AB (ref 36.0–46.0)
HEMOGLOBIN: 9.8 g/dL — AB (ref 12.0–15.0)
LYMPHS ABS: 0.9 10*3/uL (ref 0.7–4.0)
LYMPHS PCT: 11 %
MCH: 27.8 pg (ref 26.0–34.0)
MCHC: 31.2 g/dL (ref 30.0–36.0)
MCV: 89 fL (ref 78.0–100.0)
MONO ABS: 0.7 10*3/uL (ref 0.1–1.0)
MONOS PCT: 9 %
NEUTROS ABS: 5.5 10*3/uL (ref 1.7–7.7)
NEUTROS PCT: 69 %
Platelets: 195 10*3/uL (ref 150–400)
RBC: 3.53 MIL/uL — ABNORMAL LOW (ref 3.87–5.11)
RDW: 17.2 % — ABNORMAL HIGH (ref 11.5–15.5)
WBC: 7.8 10*3/uL (ref 4.0–10.5)

## 2015-02-09 LAB — BRAIN NATRIURETIC PEPTIDE

## 2015-02-09 LAB — I-STAT CHEM 8, ED
BUN: 19 mg/dL (ref 6–20)
Calcium, Ion: 1.03 mmol/L — ABNORMAL LOW (ref 1.13–1.30)
Chloride: 100 mmol/L — ABNORMAL LOW (ref 101–111)
Creatinine, Ser: 2.9 mg/dL — ABNORMAL HIGH (ref 0.44–1.00)
GLUCOSE: 92 mg/dL (ref 65–99)
HEMATOCRIT: 31 % — AB (ref 36.0–46.0)
HEMOGLOBIN: 10.5 g/dL — AB (ref 12.0–15.0)
POTASSIUM: 4.2 mmol/L (ref 3.5–5.1)
Sodium: 138 mmol/L (ref 135–145)
TCO2: 27 mmol/L (ref 0–100)

## 2015-02-09 LAB — I-STAT TROPONIN, ED
Troponin i, poc: 0.26 ng/mL (ref 0.00–0.08)
Troponin i, poc: 0.28 ng/mL (ref 0.00–0.08)
Troponin i, poc: 0.3 ng/mL (ref 0.00–0.08)

## 2015-02-09 LAB — COMPREHENSIVE METABOLIC PANEL
ALBUMIN: 2.2 g/dL — AB (ref 3.5–5.0)
ALT: 27 U/L (ref 14–54)
AST: 27 U/L (ref 15–41)
Alkaline Phosphatase: 143 U/L — ABNORMAL HIGH (ref 38–126)
Anion gap: 9 (ref 5–15)
BUN: 12 mg/dL (ref 6–20)
CHLORIDE: 99 mmol/L — AB (ref 101–111)
CO2: 31 mmol/L (ref 22–32)
CREATININE: 2.69 mg/dL — AB (ref 0.44–1.00)
Calcium: 8.5 mg/dL — ABNORMAL LOW (ref 8.9–10.3)
GFR calc non Af Amer: 16 mL/min — ABNORMAL LOW (ref >60)
GFR, EST AFRICAN AMERICAN: 18 mL/min — AB (ref >60)
Glucose, Bld: 97 mg/dL (ref 65–99)
POTASSIUM: 4.1 mmol/L (ref 3.5–5.1)
Sodium: 139 mmol/L (ref 135–145)
Total Bilirubin: 0.5 mg/dL (ref 0.3–1.2)
Total Protein: 5.5 g/dL — ABNORMAL LOW (ref 6.5–8.1)

## 2015-02-09 LAB — PROTIME-INR
INR: 1.1 (ref 0.00–1.49)
PROTHROMBIN TIME: 14.4 s (ref 11.6–15.2)

## 2015-02-09 LAB — CBG MONITORING, ED: Glucose-Capillary: 93 mg/dL (ref 65–99)

## 2015-02-09 LAB — DIGOXIN LEVEL: DIGOXIN LVL: 2 ng/mL (ref 0.8–2.0)

## 2015-02-09 MED ORDER — ALBUTEROL SULFATE HFA 108 (90 BASE) MCG/ACT IN AERS: 2.0000 | INHALATION_SPRAY | RESPIRATORY_TRACT | Status: DC | PRN

## 2015-02-09 MED ORDER — MIDODRINE HCL 5 MG PO TABS
10.0000 mg | ORAL_TABLET | Freq: Three times a day (TID) | ORAL | Status: DC
  Administered 2015-02-10 – 2015-02-19 (×28): 10 mg via ORAL
  Filled 2015-02-09 (×30): qty 2

## 2015-02-09 MED ORDER — MORPHINE SULFATE (PF) 4 MG/ML IV SOLN
4.0000 mg | Freq: Once | INTRAVENOUS | Status: AC
  Administered 2015-02-09: 4 mg via INTRAVENOUS
  Filled 2015-02-09: qty 1

## 2015-02-09 MED ORDER — ONDANSETRON HCL 4 MG/2ML IJ SOLN: 4.0000 mg | Freq: Four times a day (QID) | INTRAMUSCULAR | Status: DC | PRN

## 2015-02-09 MED ORDER — FENTANYL CITRATE (PF) 100 MCG/2ML IJ SOLN
50.0000 ug | Freq: Once | INTRAMUSCULAR | Status: AC
  Administered 2015-02-09: 50 ug via INTRAVENOUS
  Filled 2015-02-09: qty 2

## 2015-02-09 MED ORDER — ONDANSETRON HCL 4 MG/2ML IJ SOLN
4.0000 mg | Freq: Once | INTRAMUSCULAR | Status: AC
  Administered 2015-02-09: 4 mg via INTRAVENOUS
  Filled 2015-02-09: qty 2

## 2015-02-09 MED ORDER — DOPAMINE-DEXTROSE 3.2-5 MG/ML-% IV SOLN: 0.0000 ug/kg/min | Freq: Once | INTRAVENOUS | Status: DC

## 2015-02-09 MED ORDER — ACETAMINOPHEN 325 MG PO TABS: 650.0000 mg | ORAL_TABLET | ORAL | Status: DC | PRN

## 2015-02-09 MED ORDER — WARFARIN SODIUM 2 MG PO TABS
2.0000 mg | ORAL_TABLET | Freq: Once | ORAL | Status: DC
  Filled 2015-02-09: qty 1

## 2015-02-09 MED ORDER — IOHEXOL 350 MG/ML SOLN
65.0000 mL | Freq: Once | INTRAVENOUS | Status: AC | PRN
  Administered 2015-02-09: 65 mL via INTRAVENOUS

## 2015-02-09 MED ORDER — HYDROCODONE-ACETAMINOPHEN 5-325 MG PO TABS
1.0000 | ORAL_TABLET | ORAL | Status: DC | PRN
  Administered 2015-02-12 – 2015-02-15 (×3): 1 via ORAL
  Filled 2015-02-09 (×3): qty 1

## 2015-02-09 MED ORDER — DIGOXIN 125 MCG PO TABS: 0.1250 mg | ORAL_TABLET | ORAL | Status: DC

## 2015-02-09 MED ORDER — NOREPINEPHRINE BITARTRATE 1 MG/ML IV SOLN
0.0000 ug/min | Freq: Once | INTRAVENOUS | Status: AC
  Administered 2015-02-09: 2 ug/min via INTRAVENOUS
  Filled 2015-02-09: qty 4

## 2015-02-09 MED ORDER — ADULT MULTIVITAMIN W/MINERALS CH
1.0000 | ORAL_TABLET | ORAL | Status: DC
  Administered 2015-02-11 – 2015-02-15 (×3): 1 via ORAL
  Filled 2015-02-09 (×3): qty 1

## 2015-02-09 MED ORDER — VITAMIN B-1 100 MG PO TABS
100.0000 mg | ORAL_TABLET | Freq: Every day | ORAL | Status: DC
  Administered 2015-02-10 – 2015-02-15 (×5): 100 mg via ORAL
  Filled 2015-02-09 (×7): qty 1

## 2015-02-09 MED ORDER — METHIMAZOLE 10 MG PO TABS
20.0000 mg | ORAL_TABLET | Freq: Every day | ORAL | Status: DC
  Administered 2015-02-10 – 2015-02-15 (×5): 20 mg via ORAL
  Filled 2015-02-09 (×6): qty 2

## 2015-02-09 MED ORDER — ATROPINE SULFATE 1 MG/ML IJ SOLN
0.4000 mg | Freq: Once | INTRAMUSCULAR | Status: AC
  Administered 2015-02-09: 0.4 mg via INTRAVENOUS
  Filled 2015-02-09: qty 1

## 2015-02-09 MED ORDER — ANASTROZOLE 1 MG PO TABS
1.0000 mg | ORAL_TABLET | Freq: Every day | ORAL | Status: DC
  Administered 2015-02-10 – 2015-02-15 (×5): 1 mg via ORAL
  Filled 2015-02-09 (×6): qty 1

## 2015-02-09 MED ORDER — WARFARIN - PHARMACIST DOSING INPATIENT: Freq: Every day | Status: DC

## 2015-02-09 NOTE — ED Notes (Signed)
MD at bedside. 

## 2015-02-09 NOTE — Progress Notes (Signed)
See nursing notes: patient had sudden drop in HR to 40s, going in and out of junctional rhythm vs NSR.  Also dropped BP significantly with MAP in 40s.  Minimal response to atropine.  EDP started levophed which has stabilized the patient.  Cardiology on way to evaluate and PCCM being called by EDP as patient is now on levophed.

## 2015-02-09 NOTE — ED Notes (Signed)
Pt's HR going in and out from Junctional rhythm to Sinus Alison Gross

## 2015-02-09 NOTE — ED Notes (Signed)
Pt presents with sudden onset of L flank pain while at dialysis.  Pt had 2  Hours into treatment; also began to have shortness of breath.

## 2015-02-09 NOTE — ED Notes (Signed)
Pt in xray

## 2015-02-09 NOTE — ED Notes (Signed)
Heating packs given to patient to apply to painful area on left flank for pain relief.

## 2015-02-09 NOTE — H&P (Signed)
[facility] History and Physical  Alison Gross LKH:[id number] DOB: [birth year] DOA: 02/09/2015  Referring physician: EDP PCP: Maximino Greenland, MD   Chief Complaint: Chest pain   HPI: Alison Gross is a 81 y.o. female with h/o BRCA (in remission), ESRD dialysis MWF due to cardio-renal syndrome.  Patient has h/o B pleural effusions, R pleurex catheter in place, and was drained in the office today by Dr. Servando Snare.  He did note however on CXR that patients L sided pleural efusion has grown significantly since 1 month ago.  Prior to dialysis today she had been asymptomatic with regards to the L pleural effusion.  During dialysis, patient developed onset of L sided chest pain, sharp, worse with deep inspiration.  This has persisted in the ED until alleviated with morphine.  Review of Systems: Systems reviewed.  As above, otherwise negative  Past Medical History  Diagnosis Date  . Hypercholesterolemia     takes Crestor daily  . Thyroid disease     had iodine radiation  . Hypertension     takes Hyzaar daily  . Dysrhythmia     takes Carvedilol daily  . Pneumonia     history of;last time about 4-54yr ago  . GERD (gastroesophageal reflux disease)     takes Protonix daily  . Gastric ulcer   . History of blood transfusion   . History of colon polyps   . Anemia     takes iron pill daily  . Diabetes mellitus without complication ([facility]     takes Tradjenta daily  . History of radiation therapy 07/12/12-08/26/12    right breast/  . Paroxysmal atrial fibrillation ([facility]     PCP EKG 09/27/2013: A. Fibrillation.. EKG 09/29/2013: S. Tach.  . Breast cancer ([facility] 04/12/12    right-pos lymph node/left-DCIS  . Shortness of breath on exertion   . Bruit of left carotid artery     [facility]  . Acute upper GI bleeding 01/24/2012  . MGUS (monoclonal gammopathy of unknown significance) 04/21/2013  . Osteoporosis 11/29/2013  . Recurrent right pleural  effusion 07/26/2014  . Goiter 07/27/2014  . Substernal goiter 12/23/2012  . Pleural effusion, right 12/23/2012  . Dysphonia 09/20/2014  . Vocal cord paralysis     Suspected due to massive substernal goiter  . CHF (congestive heart failure) ([facility]   . Stage III chronic kidney disease 07/26/2014  . Type 2 diabetes mellitus with renal manifestations ([facility] 07/26/2014  . ESRD (end stage renal disease) ([facility]     On Hemodialysis, MWF   Past Surgical History  Procedure Laterality Date  . Carpal tunnel release Left   . Breast biopsy    . Esophagogastroduodenoscopy  01/24/2012    Procedure: ESOPHAGOGASTRODUODENOSCOPY (EGD);  Surgeon: MJeryl Columbia MD;  [facility];  Service: Endoscopy;  Laterality: N/A;  . Thyroid surgery      "removed goiter"  . Cystectomy      from back of neck  . Knee surgery      right with rods  . Esophagogastroduodenoscopy    . Colonoscopy    . Total mastectomy Bilateral 05/10/2012    Procedure: RIGHT modified mastectomy; LEFT total mastectomy;  Surgeon: CHaywood Lasso MD;  [facility]  Service: General;  Laterality: Bilateral;  . Axillary sentinel node biopsy Right 05/10/2012    Procedure: AXILLARY SENTINEL lymph  NODE BIOPSY;  Surgeon: CHaywood Lasso MD;  [facility]  Service: General;  Laterality: Right;  nuclear medicine injection right side  7:00 am   . Abdominal hysterectomy      fibroids, with bilateral SO  . Chest tube insertion Right 09/27/2014    Procedure: INSERTION OF RIGHT PLEURAL DRAINAGE CATHETER;  Surgeon: [name], MD;  Location: [facility];  Service: Thoracic;  Laterality: Right;  . Insertion of dialysis catheter N/A 01/05/2015    Procedure: INSERTION OF DIALYSIS CATHETER;  Surgeon: Angelia Mould, MD;  Location: [place];  Service: Vascular;  Laterality: N/A;  . Bascilic vein transposition Left 02/06/2015    Procedure: FIRST STAGE BRACHIAL VEIN TRANSPOSITION;  Surgeon: Conrad Drummond, MD;  Location: [place];  Service:  Vascular;  Laterality: Left;   Social History:  reports that she quit smoking about 43 years ago. Her smoking use included Cigarettes. She smoked 0.00 packs per day. She has never used smokeless tobacco. She reports that she does not drink alcohol or use illicit drugs.  No Known Allergies  Family History  Problem Relation Age of Onset  . Brain cancer Mother   . Aneurysm Father   . Diabetes Sister   . Diabetes Brother   . Diabetes Sister   . Diabetes Brother   . Heart failure Sister      Prior to Admission medications   Medication Sig Start Date End Date Taking? Authorizing Provider  acetaminophen (TYLENOL) 325 MG tablet Take 2 tablets (650 mg total) by mouth every 4 (four) hours as needed for headache or mild pain. 01/09/15  Yes Silver Huguenin Elgergawy, MD  anastrozole (ARIMIDEX) 1 MG tablet TAKE 1 TABLET (1 MG TOTAL) BY MOUTH DAILY. 10/17/14  Yes Laurie Panda, NP  digoxin (LANOXIN) 0.125 MG tablet Please Take one tablet oral daily from Monday to Friday, not on Saturday or Sunday. Patient taking differently: Take 0.125 mg by mouth See admin instructions. Please Take one tablet oral daily from Monday to Friday, not on Saturday or Sunday. 01/09/15  Yes Albertine Patricia, MD  methimazole (TAPAZOLE) 10 MG tablet Take 2 tablets (20 mg total) by mouth daily. 01/09/15  Yes Albertine Patricia, MD  midodrine (PROAMATINE) 10 MG tablet Take 1 tablet (10 mg total) by mouth 3 (three) times daily with meals. 01/09/15  Yes Albertine Patricia, MD  Multiple Vitamin (MULTIVITAMIN WITH MINERALS) TABS Take 1 tablet by mouth every other day.    Yes Historical Provider, MD  senna-docusate (SENOKOT-S) 8.6-50 MG tablet Take 1 tablet by mouth 2 (two) times daily. Patient taking differently: Take 1 tablet by mouth at bedtime as needed for moderate constipation.  01/09/15  Yes Albertine Patricia, MD  thiamine 100 MG tablet Take 1 tablet (100 mg total) by mouth daily. 11/04/14  Yes Adrian Prows, MD  VENTOLIN HFA 108 (90  BASE) MCG/ACT inhaler Inhale 2 puffs into the lungs every 4 (four) hours as needed for wheezing or shortness of breath.  10/05/14  Yes Historical Provider, MD  warfarin (COUMADIN) 0.5 mg TABS tablet Take 0.5 mg by mouth daily at 6 PM.   Yes Historical Provider, MD   Physical Exam: Filed Vitals:   02/09/15 2030 02/09/15 2045  BP: 114/43 108/43  Pulse: 67   Temp:    Resp: 15     BP 108/43 mmHg  Pulse 67  Temp(Src) 98.2 F (36.8 C) (Oral)  Resp 15  Ht 5' 5"  (1.651 m)  Wt 49.896 kg (110 lb)  BMI 18.31 kg/m2  SpO2 98%  General Appearance:    Alert, oriented, no  distress, appears stated age  Head:    Normocephalic, atraumatic  Eyes:    PERRL, EOMI, sclera non-icteric        Nose:   Nares without drainage or epistaxis. Mucosa, turbinates normal  Throat:   Moist mucous membranes. Oropharynx without erythema or exudate.  Neck:   Supple. No carotid bruits.  No thyromegaly.  No lymphadenopathy.   Back:     No CVA tenderness, no spinal tenderness  Lungs:     Clear to auscultation bilaterally, without wheezes, rhonchi or rales  Chest wall:    No tenderness to palpitation  Heart:    Regular rate and rhythm without murmurs, gallops, rubs  Abdomen:     Soft, non-tender, nondistended, normal bowel sounds, no organomegaly  Genitalia:    deferred  Rectal:    deferred  Extremities:   No clubbing, cyanosis or edema.  Pulses:   2+ and symmetric all extremities  Skin:   Skin color, texture, turgor normal, no rashes or lesions  Lymph nodes:   Cervical, supraclavicular, and axillary nodes normal  Neurologic:   CNII-XII intact. Normal strength, sensation and reflexes      throughout    Labs on Admission:  Basic Metabolic Panel:  Recent Labs Lab 02/06/15 0901 02/09/15 1634  NA 136 139  K 4.3 4.1  CL  --  99*  CO2  --  31  GLUCOSE 75 97  BUN  --  12  CREATININE  --  2.69*  CALCIUM  --  8.5*   Liver Function Tests:  Recent Labs Lab 02/09/15 1634  AST 27  ALT 27  ALKPHOS 143*   BILITOT 0.5  PROT 5.5*  ALBUMIN 2.2*   No results for input(s): LIPASE, AMYLASE in the last 168 hours. No results for input(s): AMMONIA in the last 168 hours. CBC:  Recent Labs Lab 02/06/15 0901 02/09/15 1634  WBC  --  7.8  NEUTROABS  --  5.5  HGB 10.9* 9.8*  HCT 32.0* 31.4*  MCV  --  89.0  PLT  --  195   Cardiac Enzymes: No results for input(s): CKTOTAL, CKMB, CKMBINDEX, TROPONINI in the last 168 hours.  BNP (last 3 results) No results for input(s): PROBNP in the last 8760 hours. CBG:  Recent Labs Lab 02/06/15 1123 02/06/15 1150 02/06/15 1153 02/06/15 1155  GLUCAP 66 66 44* 72    Radiological Exams on Admission: Dg Chest 2 View  02/09/2015  CLINICAL DATA:  Chest pain, shortness of breath. EXAM: CHEST  2 VIEW COMPARISON:  February 08, 2015. FINDINGS: Stable presence of large right peritracheal mass consistent with thyroid goiter. Stable moderate left pleural effusion is noted with probable underlying atelectasis. No pneumothorax is noted. Right-sided dialysis catheter is unchanged in position, with distal tips in expected position of the SVC. Right axillary surgical clips are noted. Mild right pleural effusion is noted. Bony thorax is unremarkable. Stable position of right-sided pleural drainage catheter is noted. IMPRESSION: Stable bilateral pleural effusions are noted, with left greater than right. Stable position of right-sided pleural drainage catheter, as well as right-sided dialysis catheter. No significant changes noted compared to prior exam. Electronically Signed   By: Marijo Conception, M.D.   On: 02/09/2015 16:06   Dg Chest 2 View  02/08/2015  CLINICAL DATA:  History of right-sided pleural effusion, experienced some shortness of breath ; history of diabetes, monoclonal gammopathy, DCIS of the left breast, goiter, CHF, acute and chronic renal failure. EXAM: CHEST  2 VIEW COMPARISON:  Portable chest x-ray of January 05, 2015 FINDINGS: The right lung is well-expanded.  The right-sided small caliber chest tube is unchanged in position. There is a small right pleural effusion. There is no pneumothorax. A large right paratracheal mass deviating the trachea toward the left persists consistent with the known goiter. A dual-lumen catheter via the right internal jugular approach is unchanged in position. On the left the volume of pleural fluid has increased and is now moderate in size. This partially obscures the left heart border. The heart is not enlarged. The pulmonary vascularity is not engorged. IMPRESSION: Increased volume of pleural fluid on the left since the previous study. There is no alveolar pneumonia nor pulmonary edema. Stable small right pleural effusion and small caliber right-sided chest tube. Electronically Signed   By: David  Martinique M.D.   On: 02/08/2015 13:50   Ct Angio Chest Pe W/cm &/or Wo Cm  02/09/2015  CLINICAL DATA:  Severe left chest pain that began with dialysis today. Some shortness of breath. Right breast cancer. EXAM: CT ANGIOGRAPHY CHEST WITH CONTRAST TECHNIQUE: Multidetector CT imaging of the chest was performed using the standard protocol during bolus administration of intravenous contrast. Multiplanar CT image reconstructions and MIPs were obtained to evaluate the vascular anatomy. CONTRAST:  22m OMNIPAQUE IOHEXOL 350 MG/ML SOLN COMPARISON:  Chest radiographs obtained earlier today. Chest CT dated 09/19/2014. FINDINGS: Mediastinum/Lymph Nodes: 8 markedly enlarged and heterogeneous thyroid gland extending into the superior mediastinum bilaterally and into the medial upper right chest is again demonstrated. No enlarged lymph nodes. Normally opacified pulmonary arteries with no pulmonary arterial filling defects seen. Lungs/Pleura: Moderate-sized left pleural effusion, increased in amount. Small right pleural effusion, decreased in amount. Associated left greater than right lower lobe atelectasis. The mild bilateral bullous changes. Right lower lobe  calcified granuloma. Right chest tube. Upper abdomen: Streak artifacts from the patient's arms. Mild diffuse intra-abdominal fat edema with sparing of fat in the lesser sac, without significant change. There is also a coarse calcification in that region without significant change. Diffuse subcutaneous fat edema. Musculoskeletal: Thoracic spine degenerative changes. Review of the MIP images confirms the above findings. IMPRESSION: 1. No pulmonary emboli. 2. Moderate-sized left pleural effusion, increased. 3. Small to moderate-sized right pleural effusion, decreased. 4. Bilateral compressive lower lobe atelectasis, greater on the left. 5. Stable huge thyroid goiter with substernal extension and extension into the medial right upper chest. 6. Diffuse anasarca with continued sparing of fatty in the lesser sac, possibly due to lipoma formation. Electronically Signed   By: SClaudie ReveringM.D.   On: 02/09/2015 19:59    EKG: Independently reviewed.  Assessment/Plan Principal Problem:   Pleuritic chest pain Active Problems:   Elevated troponin   ESRD (end stage renal disease) ([facility] [facility]   1. Pleuritic chest pain - 1. Suspect this left chest pain may be related to the enlarging left sided pleural effusion. 2. Elevated troponin - 1. Tele monitor 2. Serial trops 3. But troponin is at baseline and hasnt been much lower than this for as long as we have records (back to at least this summer). 4. Also not sure that cardiology would want to do stress test / intervention on this patient who has ESRD due to cardio-renal syndrome at baseline.  Will go ahead and let patient eat for now as CP is more pleuritic. 3. ESRD - left message with dialysis consult line.    Code Status: Full Code  Family Communication: Family at bedside Disposition Plan: Admit to obs  Time spent: 70 min  Adrieanna Boteler M. [facility] Pager [phone]  If 7AM-7PM, please contact the day team taking care of the  patient [email or web address] Password [email or web address] 02/09/2015, 9:20 PM

## 2015-02-09 NOTE — ED Notes (Signed)
Pt taken to CT.

## 2015-02-09 NOTE — ED Notes (Signed)
Critical Care MD in room 

## 2015-02-09 NOTE — ED Notes (Signed)
Dr Alcario Drought in room

## 2015-02-09 NOTE — ED Notes (Signed)
HR now 74

## 2015-02-09 NOTE — Consult Note (Addendum)
RFC: junctional bradycardia with hypotension  HPI:  82yoF with hx of ESRD on HD, DM2, atrial fibrillation on coumadin and digoxin, no known hx of CAD, breast cancer s/p mastectomy, with bilateral chronic exudative effusions with negative cytology, presents to ER with pleuritic chest pain, and found to have episodic junctional rhythm associated with hypotension.  Currently HDS on low dose levophed, mentatting well, in NSR with frequent pacs.  cROS: Frequent palpitations, no syncope, denies dyspnea, chest pain is unrelated to exertion, pleuritic, and improves with leaning forward  Past Medical History  Diagnosis Date  . Hypercholesterolemia     takes Crestor daily  . Thyroid disease     had iodine radiation  . Hypertension     takes Hyzaar daily  . Dysrhythmia     takes Carvedilol daily  . Pneumonia     history of;last time about 4-22yr ago  . GERD (gastroesophageal reflux disease)     takes Protonix daily  . Gastric ulcer   . History of blood transfusion   . History of colon polyps   . Anemia     takes iron pill daily  . Diabetes mellitus without complication ([facility]     takes Tradjenta daily  . History of radiation therapy 07/12/12-08/26/12    right breast/  . Paroxysmal atrial fibrillation ([facility]     PCP EKG 09/27/2013: A. Fibrillation.. EKG 09/29/2013: S. Tach.  . Breast cancer ([facility] 04/12/12    right-pos lymph node/left-DCIS  . Shortness of breath on exertion   . Bruit of left carotid artery     12/06/13 carotid duplex: no sign stenosis [facility])  . Acute upper GI bleeding 01/24/2012  . MGUS (monoclonal gammopathy of unknown significance) 04/21/2013  . Osteoporosis 11/29/2013  . Recurrent right pleural effusion 07/26/2014  . Goiter 07/27/2014  . Substernal goiter 12/23/2012  . Pleural effusion, right 12/23/2012  . Dysphonia 09/20/2014  . Vocal cord paralysis     Suspected due to massive substernal goiter  . CHF (congestive heart failure) ([facility]   . Stage III chronic kidney  disease 07/26/2014  . Type 2 diabetes mellitus with renal manifestations ([facility] 07/26/2014  . ESRD (end stage renal disease) ([facility]     On Hemodialysis, MWF   No current facility-administered medications on file prior to encounter.   Current Outpatient Prescriptions on File Prior to Encounter  Medication Sig Dispense Refill  . acetaminophen (TYLENOL) 325 MG tablet Take 2 tablets (650 mg total) by mouth every 4 (four) hours as needed for headache or mild pain.    .Marland Kitchenanastrozole (ARIMIDEX) 1 MG tablet TAKE 1 TABLET (1 MG TOTAL) BY MOUTH DAILY. 30 tablet 5  . digoxin (LANOXIN) 0.125 MG tablet Please Take one tablet oral daily from Monday to Friday, not on Saturday or Sunday. (Patient taking differently: Take 0.125 mg by mouth See admin instructions. Please Take one tablet oral daily from Monday to Friday, not on Saturday or Sunday.) 30 tablet 0  . methimazole (TAPAZOLE) 10 MG tablet Take 2 tablets (20 mg total) by mouth daily. 60 tablet 0  . midodrine (PROAMATINE) 10 MG tablet Take 1 tablet (10 mg total) by mouth 3 (three) times daily with meals. 90 tablet 1  . Multiple Vitamin (MULTIVITAMIN WITH MINERALS) TABS Take 1 tablet by mouth every other day.     . senna-docusate (SENOKOT-S) 8.6-50 MG tablet Take 1 tablet by mouth 2 (two) times daily. (Patient taking differently: Take 1 tablet by mouth at bedtime as needed for moderate constipation. )  30 tablet 0  . thiamine 100 MG tablet Take 1 tablet (100 mg total) by mouth daily. 90 tablet 0  . VENTOLIN HFA 108 (90 BASE) MCG/ACT inhaler Inhale 2 puffs into the lungs every 4 (four) hours as needed for wheezing or shortness of breath.   0   No Known Allergies  Social History   Social History  . Marital Status: Married    Spouse Name: [name]  . Number of Children: 2  . Years of Education: N/A   Occupational History  . Not on file.   Social History Main Topics  . Smoking status: Former Smoker -- 0.00 packs/day    Types: Cigarettes    Quit date:  07/26/1971  . Smokeless tobacco: Never Used  . Alcohol Use: No  . Drug Use: No  . Sexual Activity: Not Currently    Birth Control/ Protection: Surgical     Comment: menarche age 23,1st live birth 22, P2, no HRT   Other Topics Concern  . Not on file   Social History Narrative   Married.  Ambulates independently.     Family History  Problem Relation Age of Onset  . Brain cancer Mother   . Aneurysm Father   . Diabetes Sister   . Diabetes Brother   . Diabetes Sister   . Diabetes Brother   . Heart failure Sister    Exam BP 112/46 mmHg  Pulse 84  Temp(Src) 98.2 F (36.8 C) (Oral)  Resp 6  Ht '5\' 5"'$  (1.651 m)  Wt 49.896 kg (110 lb)  BMI 18.31 kg/m2  SpO2 97% 82yoF in NAD, in trendelenberg position JVP normal Clear anteriorly, R sided pigtail catheter in place Regular, no rub Soft nt nd No edema Cool AAO without gross focal defcit  ECG: Sinus Bradycardia, LAFB, anterior q waves, LVH w/ repol.  Similar to prior.  Subtle elevation in V1 with subtle depression in II, similar to prior ECGs  2nd ECG, junctional rhythm, elevation in V1 and subtle depressions in inferior leads noted  Echo: Nl EF, moderate LVH, pericardial effusion  Nuclear 09/25/14 - smale inferior lateral wall derficit, no revesrsible ischemia, EF 62%  Impression/Plan  82yoF with ESRD on HD, Afib on coumadin and digoxin, risk factors for CAD, who presents with L sided chest pain that is both pleuritic and positional, and nkown chornic pleural effusions, with pericardial effusion, found to have intermittent junctional bradycardia.  Recs: # Chest pain - likely pleurisy or pericarditis, but would rule out for MI - serial ECGs - currently comfortable - given relative comfort, would hold on treating pericarditis with NSAIDs until officially rule out  # Junctional rhythm - SSS vs. Dig toxicity.  Would d/c dig as tough medicine to manage in a dialysis patient - Given ESRD would consider leadless pacemaker  if sx persists off digoxin - Stable on levophed but would switch to dopamine for better chronotropic effect  # Paroxysmal Atrial fibrillation - INR is subtherapeutic - Would hold for now on heparin gtt given exudative pleural effusions  Terressa Koyanagi, MD Cardiology [employer]

## 2015-02-09 NOTE — Progress Notes (Signed)
ANTICOAGULATION CONSULT NOTE - Initial Consult  Pharmacy Consult for Warfarin  Indication: atrial fibrillation  No Known Allergies  Patient Measurements: Height: '5\' 5"'$  (165.1 cm) Weight: 110 lb (49.896 kg) IBW/kg (Calculated) : 57  Vital Signs: Temp: 98.2 F (36.8 C) (12/02 1513) Temp Source: Oral (12/02 1513) BP: 108/43 mmHg (12/02 2045) Pulse Rate: 67 (12/02 2030)  Labs:  Recent Labs  02/09/15 1634  HGB 9.8*  HCT 31.4*  PLT 195  LABPROT 14.4  INR 1.10  CREATININE 2.69*    Estimated Creatinine Clearance: 13.1 mL/min (by C-G formula based on Cr of 2.69).   Medical History: Past Medical History  Diagnosis Date  . Hypercholesterolemia     takes Crestor daily  . Thyroid disease     had iodine radiation  . Hypertension     takes Hyzaar daily  . Dysrhythmia     takes Carvedilol daily  . Pneumonia     history of;last time about 4-19yr ago  . GERD (gastroesophageal reflux disease)     takes Protonix daily  . Gastric ulcer   . History of blood transfusion   . History of colon polyps   . Anemia     takes iron pill daily  . Diabetes mellitus without complication ([place]     takes Tradjenta daily  . History of radiation therapy 07/12/12-08/26/12    right breast/  . Paroxysmal atrial fibrillation ([facility]     PCP EKG 09/27/2013: A. Fibrillation.. EKG 09/29/2013: S. Tach.  . Breast cancer ([place] 04/12/12    right-pos lymph node/left-DCIS  . Shortness of breath on exertion   . Bruit of left carotid artery     12/06/13 [facility] sign [facility]  . Acute upper GI bleeding 01/24/2012  . MGUS (monoclonal gammopathy of unknown significance) 04/21/2013  . Osteoporosis 11/29/2013  . Recurrent right pleural effusion 07/26/2014  . Goiter 07/27/2014  . Substernal goiter 12/23/2012  . Pleural effusion, right 12/23/2012  . Dysphonia 09/20/2014  . Vocal cord paralysis     Suspected due to massive substernal goiter  . CHF (congestive heart failure) ([place]   . Stage  III chronic kidney disease 07/26/2014  . Type 2 diabetes mellitus with renal manifestations ([place] 07/26/2014  . ESRD (end stage renal disease) ([place]     On Hemodialysis, MWF    Medications:   (Not in a hospital admission)  Assessment: [id number] who presented with L sided chest pain. Pharmacy consulted to resume home Coumadin therapy for Afib. H/H low, Plt wnl. INR on admission is sub-therapeutic at 1.1.  Home Coumadin dose: 0.5 mg daily   Goal of Therapy:  INR 2-3 Monitor platelets by anticoagulation protocol: Yes   Plan:  -Coumadin 2 mg once  -Monitor daily PT/INR   BAlbertina Parr PharmD., BCPS Clinical Pharmacist Pager [phone]

## 2015-02-09 NOTE — ED Notes (Signed)
Pt began to get nauseous after morphine admin. Dr Alcario Drought gave verbal order for '4mg'$  of zofran

## 2015-02-09 NOTE — ED Provider Notes (Signed)
CSN: [id number]     Arrival date & time 02/09/15  1507 History   First MD Initiated Contact with Patient 02/09/15 1511     No chief complaint on file.    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 Hypercholesterolemia     takes Crestor daily  . Thyroid disease     had iodine radiation  . Hypertension     takes Hyzaar daily  . Dysrhythmia     takes Carvedilol daily  . Pneumonia     history of;last time about 4-26yr ago  . GERD (gastroesophageal reflux disease)     takes Protonix daily  . Gastric ulcer   . History of blood transfusion   . History of colon polyps   . Anemia     takes iron pill daily  . Diabetes mellitus without complication ([facility]     takes Tradjenta daily  . History of radiation therapy 07/12/12-08/26/12    right breast/  . Paroxysmal atrial fibrillation ([facility]     PCP EKG 09/27/2013: A. Fibrillation.. EKG 09/29/2013: S. Tach.  . Breast cancer ([place] 04/12/12    right-pos lymph node/left-DCIS  . Shortness of breath on exertion   . Bruit of left carotid artery     [facility]06[facility]13 [facility]  . Acute upper GI bleeding 01/24/2012  . MGUS (monoclonal gammopathy of unknown significance) 04/21/2013  . Osteoporosis 11/29/2013  . Recurrent right pleural effusion 07/26/2014  . Goiter 07/27/2014  . Substernal goiter 12/23/2012  . Pleural effusion, right 12/23/2012  . Dysphonia 09/20/2014  . Vocal cord paralysis     Suspected due to massive substernal goiter   . CHF (congestive heart failure) ([facility]   . Stage III chronic kidney disease 07/26/2014  . Type 2 diabetes mellitus with renal manifestations ([facility] 07/26/2014  . ESRD (end stage renal disease) ([facility] [place]     On Hemodialysis, MWF   Past Surgical History  Procedure Laterality Date  . Carpal tunnel release Left   . Breast biopsy    . Esophagogastroduodenoscopy  01/24/2012    Procedure: ESOPHAGOGASTRODUODENOSCOPY (EGD);  Surgeon: MJeryl Columbia MD;  [facility];  Service: Endoscopy;  Laterality: N/A;  . Thyroid surgery      "removed goiter"  . Cystectomy      from back of neck  . Knee surgery      right with rods  . Esophagogastroduodenoscopy    . Colonoscopy    . Total mastectomy Bilateral 05/10/2012    Procedure: RIGHT modified mastectomy; LEFT total mastectomy;  Surgeon: CHaywood Lasso MD;  [place] [facility]  Service: General;  Laterality: Bilateral;  . Axillary sentinel node biopsy Right 05/10/2012    Procedure: AXILLARY SENTINEL lymph  NODE BIOPSY;  Surgeon: CHaywood Lasso MD;  [place]  Service: General;  Laterality: Right;  nuclear medicine injection right side  7:00 am   . Abdominal hysterectomy      fibroids, with bilateral SO  . Chest tube insertion Right 09/27/2014    Procedure: INSERTION OF RIGHT PLEURAL DRAINAGE CATHETER;  Surgeon: [name], MD;  Location: [place];  Service: Thoracic;  Laterality: Right;  . Insertion of dialysis catheter N/A 01/05/2015    Procedure: INSERTION OF DIALYSIS CATHETER;  Surgeon: Angelia Mould, MD;  Location: [place];  Service: Vascular;  Laterality: N/A;  . Bascilic vein transposition Left 02/06/2015    Procedure: FIRST STAGE BRACHIAL VEIN TRANSPOSITION;  Surgeon: Conrad Wetonka, MD;  Location: [place];  Service: Vascular;  Laterality: Left;   Family History  Problem Relation Age of Onset  . Brain cancer Mother   . Aneurysm Father   . Diabetes Sister   . Diabetes Brother   . Diabetes Sister   . Diabetes Brother    . Heart failure Sister    Social History  Substance Use Topics  . Smoking status: Former Smoker -- 0.00 packs/day    Types: Cigarettes    Quit date: 07/26/1971  . Smokeless tobacco: Never Used  . Alcohol Use: No   OB History    No data available     Review of Systems  Constitutional: Negative for fever.  HENT: Negative for sore throat.   Eyes: Negative for visual disturbance.  Respiratory: Positive for shortness of breath. Negative for cough.   Cardiovascular: Positive for chest pain and leg swelling (bilat feet).  Gastrointestinal: Negative for nausea, vomiting and abdominal pain.  Genitourinary: Negative for dysuria and difficulty urinating.  Musculoskeletal: Negative for back pain and neck pain.  Skin: Negative for rash.  Neurological: Negative for syncope, light-headedness and headaches.      Allergies  Review of patient's allergies indicates no known allergies.  Home Medications   Prior to Admission medications   Medication Sig Start Date End Date Taking? Authorizing Provider  acetaminophen (TYLENOL) 325 MG tablet Take 2 tablets (650 mg total) by mouth every 4 (four) hours as needed for headache or mild pain. 01/09/15  Yes Silver Huguenin Elgergawy, MD  anastrozole (ARIMIDEX) 1 MG tablet TAKE 1 TABLET (1 MG TOTAL) BY MOUTH DAILY. 10/17/14  Yes Laurie Panda, NP  digoxin (LANOXIN) 0.125 MG tablet Please Take one tablet oral daily from Monday to Friday, not on Saturday or Sunday. Patient taking differently: Take 0.125 mg by mouth See admin instructions. Please Take one tablet oral daily from Monday to Friday, not on Saturday or Sunday. 01/09/15  Yes Albertine Patricia, MD  methimazole (TAPAZOLE) 10 MG tablet Take 2 tablets (20 mg total) by mouth daily. 01/09/15  Yes Albertine Patricia, MD  midodrine (PROAMATINE) 10 MG tablet Take 1 tablet (10 mg total) by mouth 3 (three) times daily with meals. 01/09/15  Yes Albertine Patricia, MD  Multiple Vitamin (MULTIVITAMIN WITH MINERALS)  TABS Take 1 tablet by mouth every other day.    Yes Historical Provider, MD  senna-docusate (SENOKOT-S) 8.6-50 MG tablet Take 1 tablet by mouth 2 (two) times daily. Patient taking differently: Take 1 tablet by mouth at bedtime as needed for moderate constipation.  01/09/15  Yes Albertine Patricia, MD  thiamine 100 MG tablet Take 1 tablet (100 mg total) by mouth daily. 11/04/14  Yes Adrian Prows, MD  VENTOLIN HFA 108 (90 BASE) MCG/ACT inhaler Inhale 2 puffs into the lungs every 4 (four) hours as needed for wheezing or shortness of breath.  10/05/14  Yes Historical Provider, MD  warfarin (COUMADIN) 0.5 mg TABS tablet Take 0.5 mg by mouth daily at 6 PM.   Yes Historical Provider, MD   BP 103/51 mmHg  Pulse 74  Temp(Src)  98.2 F (36.8 C) (Oral)  Resp 12  Ht '5\' 5"'$  (1.651 m)  Wt 110 lb (49.896 kg)  BMI 18.31 kg/m2  SpO2 99% Physical Exam  Constitutional: She is oriented to person, place, and time. She appears well-developed and well-nourished. No distress.  HENT:  Head: Normocephalic and atraumatic.  Eyes: Conjunctivae and EOM are normal.  Neck: Normal range of motion.  Cardiovascular: Normal rate, regular rhythm, normal heart sounds and intact distal pulses.  Exam reveals no gallop and no friction rub.   No murmur heard. Pulmonary/Chest: Effort normal and breath sounds normal. No respiratory distress. She has no wheezes. She has no rales. She exhibits tenderness.  Abdominal: Soft. She exhibits no distension. There is no tenderness. There is no guarding.  Musculoskeletal: She exhibits no edema or tenderness.  Neurological: She is alert and oriented to person, place, and time.  Skin: Skin is warm and dry. No rash noted. She is not diaphoretic. No erythema.  Nursing note and vitals reviewed.   ED Course  .Central Line Date/Time: 02/10/2015 1:51 AM Performed by: Gareth Morgan Authorized by: Gareth Morgan Consent: Verbal consent obtained. Written consent obtained. Risks and benefits:  risks, benefits and alternatives were discussed Consent given by: patient Required items: required blood products, implants, devices, and special equipment available Patient identity confirmed: verbally with patient Time out: Immediately prior to procedure a "time out" was called to verify the correct patient, procedure, equipment, support staff and site/side marked as required. Indications: vascular access Skin prep agent dried: skin prep agent completely dried prior to procedure Sterile barriers: all five maximum sterile barriers used - cap, mask, sterile gown, sterile gloves, and large sterile sheet Hand hygiene: hand hygiene performed prior to central venous catheter insertion Location details: left internal jugular Patient position: Trendelenburg Catheter type: triple lumen Pre-procedure: landmarks identified Ultrasound guidance: yes Sterile ultrasound techniques: sterile gel and sterile probe covers were used Number of attempts: 1 Successful placement: yes Post-procedure: line sutured and dressing applied Assessment: blood return through all ports,  free fluid flow,  placement verified by x-ray and no pneumothorax on x-ray Patient tolerance: Patient tolerated the procedure well with no immediate complications   (including critical care time) Labs Review Labs Reviewed  CBC WITH DIFFERENTIAL/PLATELET - Abnormal; Notable for the following:    RBC 3.53 (*)    Hemoglobin 9.8 (*)    HCT 31.4 (*)    RDW 17.2 (*)    Eosinophils Absolute 0.8 (*)    All other components within normal limits  COMPREHENSIVE METABOLIC PANEL - Abnormal; Notable for the following:    Chloride 99 (*)    Creatinine, Ser 2.69 (*)    Calcium 8.5 (*)    Total Protein 5.5 (*)    Albumin 2.2 (*)    Alkaline Phosphatase 143 (*)    GFR calc non Af Amer 16 (*)    GFR calc Af Amer 18 (*)    All other components within normal limits  BRAIN NATRIURETIC PEPTIDE - Abnormal; Notable for the following:    B  Natriuretic Peptide >4500.0 (*)    All other components within normal limits  I-STAT TROPOININ, ED - Abnormal; Notable for the following:    Troponin i, poc 0.26 (*)    All other components within normal limits  I-STAT TROPOININ, ED - Abnormal; Notable for the following:    Troponin i, poc 0.28 (*)    All other components within normal limits  I-STAT TROPOININ, ED - Abnormal; Notable for the following:  Troponin i, poc 0.30 (*)    All other components within normal limits  I-STAT CHEM 8, ED - Abnormal; Notable for the following:    Chloride 100 (*)    Creatinine, Ser 2.90 (*)    Calcium, Ion 1.03 (*)    Hemoglobin 10.5 (*)    HCT 31.0 (*)    All other components within normal limits  CULTURE, BLOOD (ROUTINE X 2)  CULTURE, BLOOD (ROUTINE X 2)  URINE CULTURE  MRSA PCR SCREENING  PROTIME-INR  DIGOXIN LEVEL  PROTIME-INR  URINALYSIS, ROUTINE W REFLEX MICROSCOPIC (NOT AT [facility])  MAGNESIUM  PHOSPHORUS  LIPASE, BLOOD  LACTIC ACID, PLASMA  APTT  CBC  BASIC METABOLIC PANEL  MAGNESIUM  PHOSPHORUS  HEPARIN LEVEL (UNFRACTIONATED)  CBG MONITORING, ED    Imaging Review Dg Chest 2 View  02/09/2015  CLINICAL DATA:  Chest pain, shortness of breath. EXAM: CHEST  2 VIEW COMPARISON:  February 08, 2015. FINDINGS: Stable presence of large right peritracheal mass consistent with thyroid goiter. Stable moderate left pleural effusion is noted with probable underlying atelectasis. No pneumothorax is noted. Right-sided dialysis catheter is unchanged in position, with distal tips in expected position of the SVC. Right axillary surgical clips are noted. Mild right pleural effusion is noted. Bony thorax is unremarkable. Stable position of right-sided pleural drainage catheter is noted. IMPRESSION: Stable bilateral pleural effusions are noted, with left greater than right. Stable position of right-sided pleural drainage catheter, as well as right-sided dialysis catheter. No significant changes noted  compared to prior exam. Electronically Signed   By: Marijo Conception, M.D.   On: 02/09/2015 16:06   Dg Chest 2 View  02/08/2015  CLINICAL DATA:  History of right-sided pleural effusion, experienced some shortness of breath ; history of diabetes, monoclonal gammopathy, DCIS of the left breast, goiter, CHF, acute and chronic renal failure. EXAM: CHEST  2 VIEW COMPARISON:  Portable chest x-ray of January 05, 2015 FINDINGS: The right lung is well-expanded. The right-sided small caliber chest tube is unchanged in position. There is a small right pleural effusion. There is no pneumothorax. A large right paratracheal mass deviating the trachea toward the left persists consistent with the known goiter. A dual-lumen catheter via the right internal jugular approach is unchanged in position. On the left the volume of pleural fluid has increased and is now moderate in size. This partially obscures the left heart border. The heart is not enlarged. The pulmonary vascularity is not engorged. IMPRESSION: Increased volume of pleural fluid on the left since the previous study. There is no alveolar pneumonia nor pulmonary edema. Stable small right pleural effusion and small caliber right-sided chest tube. Electronically Signed   By: David  Martinique M.D.   On: 02/08/2015 13:50   Ct Angio Chest Pe W/cm &/or Wo Cm  02/09/2015  CLINICAL DATA:  Severe left chest pain that began with dialysis today. Some shortness of breath. Right breast cancer. EXAM: CT ANGIOGRAPHY CHEST WITH CONTRAST TECHNIQUE: Multidetector CT imaging of the chest was performed using the standard protocol during bolus administration of intravenous contrast. Multiplanar CT image reconstructions and MIPs were obtained to evaluate the vascular anatomy. CONTRAST:  56m OMNIPAQUE IOHEXOL 350 MG/ML SOLN COMPARISON:  Chest radiographs obtained earlier today. Chest CT dated 09/19/2014. FINDINGS: Mediastinum/Lymph Nodes: 8 markedly enlarged and heterogeneous thyroid gland  extending into the superior mediastinum bilaterally and into the medial upper right chest is again demonstrated. No enlarged lymph nodes. Normally opacified pulmonary arteries with no pulmonary arterial filling defects  seen. Lungs/Pleura: Moderate-sized left pleural effusion, increased in amount. Small right pleural effusion, decreased in amount. Associated left greater than right lower lobe atelectasis. The mild bilateral bullous changes. Right lower lobe calcified granuloma. Right chest tube. Upper abdomen: Streak artifacts from the patient's arms. Mild diffuse intra-abdominal fat edema with sparing of fat in the lesser sac, without significant change. There is also a coarse calcification in that region without significant change. Diffuse subcutaneous fat edema. Musculoskeletal: Thoracic spine degenerative changes. Review of the MIP images confirms the above findings. IMPRESSION: 1. No pulmonary emboli. 2. Moderate-sized left pleural effusion, increased. 3. Small to moderate-sized right pleural effusion, decreased. 4. Bilateral compressive lower lobe atelectasis, greater on the left. 5. Stable huge thyroid goiter with substernal extension and extension into the medial right upper chest. 6. Diffuse anasarca with continued sparing of fatty in the lesser sac, possibly due to lipoma formation. Electronically Signed   By: Claudie Revering M.D.   On: 02/09/2015 19:59   Dg Chest Port 1 View  02/10/2015  CLINICAL DATA:  Dyspnea and cough.  Central line placement. EXAM: PORTABLE CHEST 1 VIEW COMPARISON:  02/09/2015 FINDINGS: There is a new left jugular central line with tip at the expected location of the SVC near the azygos vein junction dual-lumen right-sided central line again noted. Right chest tube is unchanged in position. No pneumothorax. Dense consolidation and effusion persist on the left, as well as the smaller right-sided effusion. Moderate vascular congestion is present, as well as central ground-glass opacities  and this is mildly worsened from the earlier study. IMPRESSION: 1. New left jugular central line.  No pneumothorax. 2. Worsened ground-glass opacities and vascular congestion bilaterally. This may be partially due to supine positioning of the current study but a component of congestive failure or volume overload would also be a consideration. 3. Persistent dense consolidation and effusion on the left. Electronically Signed   By: Andreas Newport M.D.   On: 02/10/2015 01:48   I have personally reviewed and evaluated these images and lab results as part of my medical decision-making.   EKG Interpretation   Date/Time:  Friday February 09 2015 15:09:10 EST Ventricular Rate:  71 PR Interval:  178 QRS Duration: 81 QT Interval:  396 QTC Calculation: 430 R Axis:   -64 Text Interpretation:  Sinus rhythm Probable left atrial enlargement Left  anterior fascicular block LVH with secondary repolarization abnormality  Anterior Q waves, possibly due to LVH No significant change since last  tracing Confirmed by [facility] MD, [place] ([id number]) on 02/09/2015 3:17:08 PM       CRITICAL CARE: hypotension Performed by: Alvino Chapel   Total critical care time: 45 minutes  Critical care time was exclusive of separately billable procedures and treating other patients.  Critical care was necessary to treat or prevent imminent or life-threatening deterioration.  Critical care was time spent personally by me on the following activities: development of treatment plan with patient and/or surrogate as well as nursing, discussions with consultants, evaluation of patient's response to treatment, examination of patient, obtaining history from patient or surrogate, ordering and performing treatments and interventions, ordering and review of laboratory studies, ordering and review of radiographic studies, pulse oximetry and re-evaluation of patient's condition.   MDM   Final diagnoses:  Cough     81 year old female with history of ESRD M-W-F, hypertension, DM, chronic pleural effusions followed by CT Surgery,, MGUS, breast Ca/ DCIS, DVT, atrial fibrillation, diastolic CHF,presents with concern of chest pain. Differential diagnosis for chest  pain includes pulmonary embolus, dissection, pneumothorax, pneumonia, ACS, myocarditis, pericarditis.  EKG was done and evaluate by me and showed no acute ST changes and no signs of pericarditis. Chest x-ray was done and evaluated by me and radiology and showed no sign of pneumonia or pneumothorax, however showed bilateral pleural effusions, left greater than right.  Given stabbing/pleuritic pain, discussed risks of CT PE, and discussed with Nephrology and ordered CT PE study which showed no PE or dissection.  Troponins largely unchanged. Pt without infectious symptoms, no leukocytosis, doubt pneumonia/no sign of empyema.  Hospitalist Dr. Alcario Drought initially consulted for admission for concern of chest pain, possible etiologies including pleural effusion, however at time of his evaluation, patient became bradycardic and hypotensive.  Patient in and out of junctional bradycardia. Cardiology consulted, concern for possible sick sinus syndrome.  Potassium repeated and normal.  Digoxin level ordered and WNL. Ordered cx, however did not start abx given doubt symptoms caused by infection by hx/laboratory work. Pt given atropine without change, then initiated on levophed with improvement in HR and BP.  Central line placed and pt admitted to ICU for continued care.    Gareth Morgan, MD 02/10/15 [phone]

## 2015-02-09 NOTE — ED Notes (Addendum)
Dr Billy Fischer in room with Dr gardner. Crash cart outside of room. Pt alert and oriented x 4.

## 2015-02-09 NOTE — ED Notes (Signed)
Levo started at 10mg/min

## 2015-02-09 NOTE — ED Notes (Signed)
HR 62 a-fib

## 2015-02-09 NOTE — ED Notes (Signed)
Dr Billy Fischer approved IV to right arm.

## 2015-02-09 NOTE — ED Notes (Addendum)
Pt's BP dropped to 83/38, pt alert and oriented x 4, Dr Alcario Drought notified and in room. EKG shot and pt in junctional rhythm with HR 47.

## 2015-02-09 NOTE — Progress Notes (Signed)
[address]       [place] [address]             [id number]                    Derin D [facility] [place] Medical Record #[id number] Date of Birth: 04-28-1932  Referring: Chauncey Cruel, MD Primary Care: Maximino Greenland, MD  Chief Complaint:   SOB s/p talc pleurodesis via right Pleurx catheter  History of Present Illness:    Alison Gross is a 81 yo African American Female with known history of thyroid goiter, right breast cancer (in remission), hyperlipidemia, atrial fibrillation on chronic anticoagulation, HTN, DM, and recurrent pleural effusions. The patient presented to [facility] Emergency Department on 09/19/2014 with complaints of worsening shortness of breath for the past several days. She had undergone several thoracentesis in the past, with most recent being 6/16. Approximately 800 cc of exudative fluid ws removed. CT chest obtained in the ED showed a moderate right sided pleural effusion. The patient was admitted to the medicine service for further workup. The patient underwent IR right thoracentesis which removed 1.2 liters hazy, amber fluid. The fluid was sent for cytology which revealed reactive mesothelial cells without evidence of malignancy. Fluid cultures were negative for bacteria.  The patient also complained of dysphagia while in the hospital and SSLP consult was obtained and showed some dysfunction and her diet was adjusted. Post procedure, the patient developed hypotension with acute shock. She required pressor support. Workup later revealed evidence of adrenal insufficiency. She was treated with steroids and fluid balance was corrected. Troponin was mildly elevated and she was ruled out for acute cardiac event. Once the patient was stabilized, it was felt surgical intervention may be required on her goiter. Cardiac surgery was contacted  surgical resection vs. Pleur-x catheter for palliative relief.  Ultimately, because of her  frail status and protein malnutrition, surgery to remove her goiter was NOT recommended. Aright Pleur X catheter be placed and this was done on 09/27/2014. Home health has been draining the right Pleur X.   Since last seen she was sdmitted with progressive renal failure and was started on dialysis   Current Activity/ Functional Status:  Patient is independent with mobility/ambulation, transfers, ADL's, IADL's.   Zubrod Score: At the time of surgery this patient's most appropriate activity status/level should be described as: '[]'$     0    Normal activity, no symptoms '[]'$     1    Restricted in physical strenuous activity but ambulatory, able to do out light work '[x]'$     2    Ambulatory and capable of self care, unable to do work activities, up and about               >50 % of waking hours                              '[]'$     3    Only limited self care, in bed greater than 50% of waking hours '[]'$     4    Completely disabled, no self care, confined to bed or chair '[]'$     5    Moribund   Past Medical History  Diagnosis Date  . Hypercholesterolemia     takes Crestor daily  . Thyroid disease     had iodine radiation  . Hypertension  takes Hyzaar daily  . Dysrhythmia     takes Carvedilol daily  . Pneumonia     history of;last time about 4-34yr ago  . GERD (gastroesophageal reflux disease)     takes Protonix daily  . Gastric ulcer   . History of blood transfusion   . History of colon polyps   . Anemia     takes iron pill daily  . Diabetes mellitus without complication ([facility]     takes Tradjenta daily  . History of radiation therapy 07/12/12-08/26/12    right breast/  . Paroxysmal atrial fibrillation ([facility]     PCP EKG 09/27/2013: A. Fibrillation.. EKG 09/29/2013: S. Tach.  . Breast cancer ([facility] 04/12/12    right-pos lymph node/left-DCIS  . Shortness of breath on exertion   . Bruit of left carotid artery     12/06/13 carotid duplex: no sign stenosis [facility])  . Acute upper GI  bleeding 01/24/2012  . MGUS (monoclonal gammopathy of unknown significance) 04/21/2013  . Osteoporosis 11/29/2013  . Recurrent right pleural effusion 07/26/2014  . Goiter 07/27/2014  . Substernal goiter 12/23/2012  . Pleural effusion, right 12/23/2012  . Dysphonia 09/20/2014  . Vocal cord paralysis     Suspected due to massive substernal goiter  . CHF (congestive heart failure) ([facility]   . Stage III chronic kidney disease 07/26/2014  . Type 2 diabetes mellitus with renal manifestations ([facility] 07/26/2014  . ESRD (end stage renal disease) ([facility]     On Hemodialysis, MWF    Past Surgical History  Procedure Laterality Date  . Carpal tunnel release Left   . Breast biopsy    . Esophagogastroduodenoscopy  01/24/2012    Procedure: ESOPHAGOGASTRODUODENOSCOPY (EGD);  Surgeon: MJeryl Columbia MD;  [facility];  Service: Endoscopy;  Laterality: N/A;  . Thyroid surgery      "removed goiter"  . Cystectomy      from back of neck  . Knee surgery      right with rods  . Esophagogastroduodenoscopy    . Colonoscopy    . Total mastectomy Bilateral 05/10/2012    Procedure: RIGHT modified mastectomy; LEFT total mastectomy;  Surgeon: CHaywood Lasso MD;  [facility]: [facility]  Service: General;  Laterality: Bilateral;  . Axillary sentinel node biopsy Right 05/10/2012    Procedure: AXILLARY SENTINEL lymph  NODE BIOPSY;  Surgeon: CHaywood Lasso MD;  Location: [facility]  Service: General;  Laterality: Right;  nuclear medicine injection right side  7:00 am   . Abdominal hysterectomy      fibroids, with bilateral SO  . Chest tube insertion Right 09/27/2014    Procedure: INSERTION OF RIGHT PLEURAL DRAINAGE CATHETER;  Surgeon: EGrace Isaac MD;  [facility]  Service: Thoracic;  Laterality: Right;  . Insertion of dialysis catheter N/A 01/05/2015    Procedure: INSERTION OF DIALYSIS CATHETER;  Surgeon: CAngelia Mould MD;  Location: [facility]  Service: Vascular;  Laterality: N/A;  . Bascilic vein  transposition Left 02/06/2015    Procedure: FIRST STAGE BRACHIAL VEIN TRANSPOSITION;  Surgeon: BConrad Jasper MD;  [place] [facility]  Service: Vascular;  Laterality: Left;    Family History  Problem Relation Age of Onset  . Brain cancer Mother   . Aneurysm Father   . Diabetes Sister   . Diabetes Brother   . Diabetes Sister   . Diabetes Brother   . Heart failure Sister     Social History   Social History  . Marital Status:  Married    Spouse Name: [name]  . Number of Children: 2  . Years of Education: N/A   Social History Main Topics  . Smoking status: Former Smoker -- 0.00 packs/day    Types: Cigarettes    Quit date: 07/26/1971  . Smokeless tobacco: Never Used  . Alcohol Use: No  . Drug Use: No  . Sexual Activity: Not Currently    Birth Control/ Protection: Surgical     Comment: menarche age 44,1st live birth 28, P2, no HRT   Social History Narrative   Married.  Ambulates independently.      History  Smoking status  . Former Smoker -- 0.00 packs/day  . Types: Cigarettes  . Quit date: 07/26/1971  Smokeless tobacco  . Never Used    History  Alcohol Use No     No Known Allergies  Current Outpatient Prescriptions  Medication Sig Dispense Refill  . acetaminophen (TYLENOL) 325 MG tablet Take 2 tablets (650 mg total) by mouth every 4 (four) hours as needed for headache or mild pain.    Marland Kitchen amiodarone (PACERONE) 200 MG tablet Take 200 mg by mouth daily.     Marland Kitchen anastrozole (ARIMIDEX) 1 MG tablet TAKE 1 TABLET (1 MG TOTAL) BY MOUTH DAILY. 30 tablet 5  . digoxin (LANOXIN) 0.125 MG tablet Please Take one tablet oral daily from Monday to Friday, not on Saturday or Sunday. 30 tablet 0  . MEPHYTON 5 MG tablet Take 5 mg by mouth daily.     . methimazole (TAPAZOLE) 10 MG tablet Take 2 tablets (20 mg total) by mouth daily. 60 tablet 0  . midodrine (PROAMATINE) 10 MG tablet Take 1 tablet (10 mg total) by mouth 3 (three) times daily with meals. 90 tablet 1  . Multiple Vitamin  (MULTIVITAMIN WITH MINERALS) TABS Take 1 tablet by mouth every other day.     . Nutritional Supplements (FEEDING SUPPLEMENT, NEPRO CARB STEADY,) LIQD Take 237 mLs by mouth 3 (three) times daily with meals. 24 Can 0  . oxyCODONE-acetaminophen (ROXICET) 5-325 MG tablet Take 1 tablet by mouth every 6 (six) hours as needed. 20 tablet 0  . senna-docusate (SENOKOT-S) 8.6-50 MG tablet Take 1 tablet by mouth 2 (two) times daily. (Patient taking differently: Take 1 tablet by mouth daily. ) 30 tablet 0  . thiamine 100 MG tablet Take 1 tablet (100 mg total) by mouth daily. 90 tablet 0  . VENTOLIN HFA 108 (90 BASE) MCG/ACT inhaler Inhale 2 puffs into the lungs every 4 (four) hours as needed for wheezing or shortness of breath.   0  . warfarin (COUMADIN) 2 MG tablet Take 1 tablet (2 mg total) by mouth daily at 6 PM. Take '4mg'$  on 11/2 and 11/3; then resume prior home dose of '2mg'$  daily (Patient taking differently: Take 1-2 mg by mouth daily at 6 PM. Take 1 mg by mouth every other day alternating with 2 mg.)  0   No current facility-administered medications for this visit.      Review of Systems:     Cardiac Review of Systems: Y or N  Chest Pain [  n  ]  Resting SOB [ y  ] Exertional SOB  Blue.Reese  ]  Orthopnea Blue.Reese  ]   Pedal Edema [  y ]    Palpitations [ y ] Syncope  [n  ]   Presyncope [ y  ]  General Review of Systems: [Y] = yes [  ]=no Constitional: recent weight change [  ];  Wt loss over the last 3 months [   ] anorexia [  ]; fatigue [  ]; nausea [  ]; night sweats [  ]; fever [  ]; or chills [  ];          Dental: poor dentition[  ]; Last Dentist visit:   Eye : blurred vision [  ]; diplopia [   ]; vision changes [  ];  Amaurosis fugax[  ]; Resp: cough [  ];  wheezing[  ];  hemoptysis[  ]; shortness of breath[  ]; paroxysmal nocturnal dyspnea[  ]; dyspnea on exertion[  ]; or orthopnea[  ];  GI:  gallstones[  ], vomiting[  ];  dysphagia[  ]; melena[  ];  hematochezia [  ]; heartburn[  ];   Hx of  Colonoscopy[   ]; GU: kidney stones [  ]; hematuria[  ];   dysuria [  ];  nocturia[  ];  history of     obstruction [  ]; urinary frequency [  ]             Skin: rash, swelling[  ];, hair loss[  ];  peripheral edema[  ];  or itching[  ]; Musculosketetal: myalgias[  ];  joint swelling[  ];  joint erythema[  ];  joint pain[  ];  back pain[  ];  Heme/Lymph: bruising[  ];  bleeding[  ];  anemia[  ];  Neuro: TIA[  ];  headaches[  ];  stroke[  ];  vertigo[  ];  seizures[  ];   paresthesias[  ];  difficulty walking[  ];  Psych:depression[  ]; anxiety[  ];  Endocrine: diabetes[  ];  thyroid dysfunction[  ];  Immunizations: Flu up to date [  ]; Pneumococcal up to date [  ];  Other:  Physical Exam: BP 109/56 mmHg  Pulse 79  Resp 16  Ht '5\' 5"'$  (1.651 m)  Wt 115 lb (52.164 kg)  BMI 19.14 kg/m2  SpO2   PHYSICAL EXAMINATION: General appearance: alert, cooperative and appears older than stated age Head: Normocephalic, without obvious abnormality, atraumatic Neck: no adenopathy, no carotid bruit, no JVD, supple, symmetrical, trachea midline and thyroid not enlarged, symmetric, no tenderness/mass/nodules Lymph nodes: Cervical, supraclavicular, and axillary nodes normal. Resp: diminished breath sounds bibasilar Back: symmetric, no curvature. ROM normal. No CVA tenderness. Cardio: irregularly irregular rhythm GI: soft, non-tender; bowel sounds normal; no masses,  no organomegaly Extremities: edema 3+ edema bilaterally Neurologic: Grossly normal New left upper arm dialysis graft With palpable thrill Diagnostic Studies & Laboratory data:     Recent Radiology Findings:  No results found.  I have independently reviewed the above radiologic studies.  Recent Lab Findings: Lab Results  Component Value Date   WBC 6.9 01/10/2015   HGB 10.9* 02/06/2015   HCT 32.0* 02/06/2015   PLT 164 01/10/2015   GLUCOSE 75 02/06/2015   ALT 30 01/01/2015   AST 28 01/01/2015   NA 136 02/06/2015   K 4.3 02/06/2015   CL 98*  01/10/2015   CREATININE 4.82* 01/10/2015   BUN 55* 01/10/2015   CO2 25 01/10/2015   TSH <0.010* 01/01/2015   INR 1.18 02/06/2015   HGBA1C 6.2* 07/27/2014      Assessment / Plan:   #1 continued bilateral pleural effusions right greater than left in spite talc pleurodesis via the right Pleurx catheter- the catheter was drained today and 500 mL came out. Her chest x-ray looks improved compared to her discharge from.  Plan to see the patient back in 4 weeks with chest xray,      [name] MD      [address] [place] [address] Office [phone]   Beeper [phone]  02/09/2015 3:19 PM

## 2015-02-10 ENCOUNTER — Inpatient Hospital Stay (HOSPITAL_COMMUNITY): Payer: Commercial Managed Care - HMO

## 2015-02-10 DIAGNOSIS — E7800 Pure hypercholesterolemia, unspecified: Secondary | ICD-10-CM | POA: Diagnosis present

## 2015-02-10 DIAGNOSIS — I272 Other secondary pulmonary hypertension: Secondary | ICD-10-CM | POA: Diagnosis present

## 2015-02-10 DIAGNOSIS — K259 Gastric ulcer, unspecified as acute or chronic, without hemorrhage or perforation: Secondary | ICD-10-CM | POA: Diagnosis present

## 2015-02-10 DIAGNOSIS — R7989 Other specified abnormal findings of blood chemistry: Secondary | ICD-10-CM | POA: Diagnosis not present

## 2015-02-10 DIAGNOSIS — J90 Pleural effusion, not elsewhere classified: Secondary | ICD-10-CM | POA: Diagnosis present

## 2015-02-10 DIAGNOSIS — Z79811 Long term (current) use of aromatase inhibitors: Secondary | ICD-10-CM | POA: Diagnosis not present

## 2015-02-10 DIAGNOSIS — R222 Localized swelling, mass and lump, trunk: Secondary | ICD-10-CM | POA: Diagnosis present

## 2015-02-10 DIAGNOSIS — E11649 Type 2 diabetes mellitus with hypoglycemia without coma: Secondary | ICD-10-CM | POA: Diagnosis present

## 2015-02-10 DIAGNOSIS — Z87891 Personal history of nicotine dependence: Secondary | ICD-10-CM | POA: Diagnosis not present

## 2015-02-10 DIAGNOSIS — Z808 Family history of malignant neoplasm of other organs or systems: Secondary | ICD-10-CM | POA: Diagnosis not present

## 2015-02-10 DIAGNOSIS — I482 Chronic atrial fibrillation: Secondary | ICD-10-CM | POA: Diagnosis present

## 2015-02-10 DIAGNOSIS — N186 End stage renal disease: Secondary | ICD-10-CM | POA: Diagnosis present

## 2015-02-10 DIAGNOSIS — Z833 Family history of diabetes mellitus: Secondary | ICD-10-CM | POA: Diagnosis not present

## 2015-02-10 DIAGNOSIS — Z681 Body mass index (BMI) 19 or less, adult: Secondary | ICD-10-CM | POA: Diagnosis not present

## 2015-02-10 DIAGNOSIS — R071 Chest pain on breathing: Secondary | ICD-10-CM | POA: Diagnosis present

## 2015-02-10 DIAGNOSIS — Z8249 Family history of ischemic heart disease and other diseases of the circulatory system: Secondary | ICD-10-CM | POA: Diagnosis not present

## 2015-02-10 DIAGNOSIS — J3800 Paralysis of vocal cords and larynx, unspecified: Secondary | ICD-10-CM | POA: Diagnosis present

## 2015-02-10 DIAGNOSIS — C50912 Malignant neoplasm of unspecified site of left female breast: Secondary | ICD-10-CM | POA: Diagnosis present

## 2015-02-10 DIAGNOSIS — E854 Organ-limited amyloidosis: Secondary | ICD-10-CM | POA: Diagnosis not present

## 2015-02-10 DIAGNOSIS — Z7901 Long term (current) use of anticoagulants: Secondary | ICD-10-CM | POA: Diagnosis not present

## 2015-02-10 DIAGNOSIS — E1122 Type 2 diabetes mellitus with diabetic chronic kidney disease: Secondary | ICD-10-CM | POA: Diagnosis present

## 2015-02-10 DIAGNOSIS — Z66 Do not resuscitate: Secondary | ICD-10-CM | POA: Diagnosis present

## 2015-02-10 DIAGNOSIS — I480 Paroxysmal atrial fibrillation: Secondary | ICD-10-CM | POA: Diagnosis present

## 2015-02-10 DIAGNOSIS — I5033 Acute on chronic diastolic (congestive) heart failure: Secondary | ICD-10-CM | POA: Diagnosis present

## 2015-02-10 DIAGNOSIS — R0781 Pleurodynia: Secondary | ICD-10-CM | POA: Diagnosis not present

## 2015-02-10 DIAGNOSIS — E440 Moderate protein-calorie malnutrition: Secondary | ICD-10-CM | POA: Diagnosis present

## 2015-02-10 DIAGNOSIS — Z992 Dependence on renal dialysis: Secondary | ICD-10-CM | POA: Diagnosis not present

## 2015-02-10 DIAGNOSIS — R011 Cardiac murmur, unspecified: Secondary | ICD-10-CM | POA: Diagnosis present

## 2015-02-10 DIAGNOSIS — D638 Anemia in other chronic diseases classified elsewhere: Secondary | ICD-10-CM | POA: Diagnosis present

## 2015-02-10 DIAGNOSIS — K5900 Constipation, unspecified: Secondary | ICD-10-CM | POA: Diagnosis not present

## 2015-02-10 DIAGNOSIS — Z1501 Genetic susceptibility to malignant neoplasm of breast: Secondary | ICD-10-CM | POA: Diagnosis not present

## 2015-02-10 DIAGNOSIS — L8993 Pressure ulcer of unspecified site, stage 3: Secondary | ICD-10-CM | POA: Diagnosis not present

## 2015-02-10 DIAGNOSIS — E778 Other disorders of glycoprotein metabolism: Secondary | ICD-10-CM | POA: Diagnosis present

## 2015-02-10 DIAGNOSIS — M810 Age-related osteoporosis without current pathological fracture: Secondary | ICD-10-CM | POA: Diagnosis present

## 2015-02-10 DIAGNOSIS — K219 Gastro-esophageal reflux disease without esophagitis: Secondary | ICD-10-CM | POA: Diagnosis present

## 2015-02-10 DIAGNOSIS — E0500 Thyrotoxicosis with diffuse goiter without thyrotoxic crisis or storm: Secondary | ICD-10-CM | POA: Diagnosis present

## 2015-02-10 DIAGNOSIS — R001 Bradycardia, unspecified: Secondary | ICD-10-CM | POA: Diagnosis present

## 2015-02-10 DIAGNOSIS — I132 Hypertensive heart and chronic kidney disease with heart failure and with stage 5 chronic kidney disease, or end stage renal disease: Secondary | ICD-10-CM | POA: Diagnosis present

## 2015-02-10 DIAGNOSIS — Z9013 Acquired absence of bilateral breasts and nipples: Secondary | ICD-10-CM | POA: Diagnosis not present

## 2015-02-10 DIAGNOSIS — R627 Adult failure to thrive: Secondary | ICD-10-CM | POA: Diagnosis present

## 2015-02-10 DIAGNOSIS — I9589 Other hypotension: Secondary | ICD-10-CM | POA: Diagnosis not present

## 2015-02-10 DIAGNOSIS — E859 Amyloidosis, unspecified: Secondary | ICD-10-CM | POA: Diagnosis present

## 2015-02-10 DIAGNOSIS — N2581 Secondary hyperparathyroidism of renal origin: Secondary | ICD-10-CM | POA: Diagnosis present

## 2015-02-10 DIAGNOSIS — I313 Pericardial effusion (noninflammatory): Secondary | ICD-10-CM | POA: Diagnosis present

## 2015-02-10 DIAGNOSIS — Z79899 Other long term (current) drug therapy: Secondary | ICD-10-CM | POA: Diagnosis not present

## 2015-02-10 DIAGNOSIS — D472 Monoclonal gammopathy: Secondary | ICD-10-CM | POA: Diagnosis present

## 2015-02-10 DIAGNOSIS — Z923 Personal history of irradiation: Secondary | ICD-10-CM | POA: Diagnosis not present

## 2015-02-10 DIAGNOSIS — Z515 Encounter for palliative care: Secondary | ICD-10-CM | POA: Diagnosis not present

## 2015-02-10 DIAGNOSIS — C50911 Malignant neoplasm of unspecified site of right female breast: Secondary | ICD-10-CM | POA: Diagnosis present

## 2015-02-10 DIAGNOSIS — I455 Other specified heart block: Secondary | ICD-10-CM | POA: Diagnosis present

## 2015-02-10 LAB — HEPARIN LEVEL (UNFRACTIONATED)
HEPARIN UNFRACTIONATED: 0.14 [IU]/mL — AB (ref 0.30–0.70)
Heparin Unfractionated: 0.15 IU/mL — ABNORMAL LOW (ref 0.30–0.70)

## 2015-02-10 LAB — LACTIC ACID, PLASMA: LACTIC ACID, VENOUS: 1.4 mmol/L (ref 0.5–2.0)

## 2015-02-10 LAB — BASIC METABOLIC PANEL
ANION GAP: 9 (ref 5–15)
BUN: 16 mg/dL (ref 6–20)
CALCIUM: 8.4 mg/dL — AB (ref 8.9–10.3)
CO2: 28 mmol/L (ref 22–32)
CREATININE: 3.06 mg/dL — AB (ref 0.44–1.00)
Chloride: 101 mmol/L (ref 101–111)
GFR, EST AFRICAN AMERICAN: 16 mL/min — AB (ref >60)
GFR, EST NON AFRICAN AMERICAN: 13 mL/min — AB (ref >60)
Glucose, Bld: 107 mg/dL — ABNORMAL HIGH (ref 65–99)
Potassium: 4.5 mmol/L (ref 3.5–5.1)
SODIUM: 138 mmol/L (ref 135–145)

## 2015-02-10 LAB — CBC
HCT: 29.6 % — ABNORMAL LOW (ref 36.0–46.0)
Hemoglobin: 9.5 g/dL — ABNORMAL LOW (ref 12.0–15.0)
MCH: 28.7 pg (ref 26.0–34.0)
MCHC: 32.1 g/dL (ref 30.0–36.0)
MCV: 89.4 fL (ref 78.0–100.0)
PLATELETS: 158 10*3/uL (ref 150–400)
RBC: 3.31 MIL/uL — ABNORMAL LOW (ref 3.87–5.11)
RDW: 17.3 % — AB (ref 11.5–15.5)
WBC: 9.2 10*3/uL (ref 4.0–10.5)

## 2015-02-10 LAB — PROTIME-INR
INR: 1.06 (ref 0.00–1.49)
PROTHROMBIN TIME: 14 s (ref 11.6–15.2)

## 2015-02-10 LAB — PHOSPHORUS: PHOSPHORUS: 4.3 mg/dL (ref 2.5–4.6)

## 2015-02-10 LAB — CORTISOL-AM, BLOOD: Cortisol - AM: 18.6 ug/dL (ref 6.7–22.6)

## 2015-02-10 LAB — APTT: APTT: 22 s — AB (ref 24–37)

## 2015-02-10 LAB — MAGNESIUM: Magnesium: 1.9 mg/dL (ref 1.7–2.4)

## 2015-02-10 LAB — LIPASE, BLOOD: LIPASE: 24 U/L (ref 11–51)

## 2015-02-10 LAB — MRSA PCR SCREENING: MRSA BY PCR: NEGATIVE

## 2015-02-10 LAB — GLUCOSE, CAPILLARY: Glucose-Capillary: 98 mg/dL (ref 65–99)

## 2015-02-10 MED ORDER — ASPIRIN 81 MG PO CHEW
324.0000 mg | CHEWABLE_TABLET | ORAL | Status: AC
  Administered 2015-02-10: 324 mg via ORAL
  Filled 2015-02-10: qty 4

## 2015-02-10 MED ORDER — CETYLPYRIDINIUM CHLORIDE 0.05 % MT LIQD
7.0000 mL | Freq: Two times a day (BID) | OROMUCOSAL | Status: DC
  Administered 2015-02-10 – 2015-02-15 (×9): 7 mL via OROMUCOSAL

## 2015-02-10 MED ORDER — HEPARIN BOLUS VIA INFUSION
1500.0000 [IU] | Freq: Once | INTRAVENOUS | Status: AC
  Administered 2015-02-10: 1500 [IU] via INTRAVENOUS
  Filled 2015-02-10: qty 1500

## 2015-02-10 MED ORDER — HEPARIN BOLUS VIA INFUSION
2500.0000 [IU] | Freq: Once | INTRAVENOUS | Status: AC
  Administered 2015-02-10: 2500 [IU] via INTRAVENOUS
  Filled 2015-02-10: qty 2500

## 2015-02-10 MED ORDER — ALBUTEROL SULFATE (2.5 MG/3ML) 0.083% IN NEBU: 3.0000 mL | INHALATION_SOLUTION | RESPIRATORY_TRACT | Status: DC | PRN

## 2015-02-10 MED ORDER — SODIUM CHLORIDE 0.9 % IV SOLN: 100.0000 mL | INTRAVENOUS | Status: DC | PRN

## 2015-02-10 MED ORDER — LIDOCAINE HCL (PF) 1 % IJ SOLN: 5.0000 mL | INTRAMUSCULAR | Status: DC | PRN

## 2015-02-10 MED ORDER — HEPARIN SODIUM (PORCINE) 1000 UNIT/ML DIALYSIS: 2000.0000 [IU] | Freq: Once | INTRAMUSCULAR | Status: DC

## 2015-02-10 MED ORDER — DEXTROSE 5 % IV SOLN
0.0000 ug/min | INTRAVENOUS | Status: DC
  Administered 2015-02-10 – 2015-02-12 (×2): 10 ug/min via INTRAVENOUS
  Administered 2015-02-13: 2.5 ug/min via INTRAVENOUS
  Filled 2015-02-10 (×2): qty 16

## 2015-02-10 MED ORDER — SODIUM CHLORIDE 0.9 % IV SOLN: 250.0000 mL | INTRAVENOUS | Status: DC | PRN

## 2015-02-10 MED ORDER — PENTAFLUOROPROP-TETRAFLUOROETH EX AERO: 1.0000 "application " | INHALATION_SPRAY | CUTANEOUS | Status: DC | PRN

## 2015-02-10 MED ORDER — LIDOCAINE-PRILOCAINE 2.5-2.5 % EX CREA
1.0000 "application " | TOPICAL_CREAM | CUTANEOUS | Status: DC | PRN
  Filled 2015-02-10: qty 5

## 2015-02-10 MED ORDER — ASPIRIN 300 MG RE SUPP: 300.0000 mg | RECTAL | Status: AC

## 2015-02-10 MED ORDER — SODIUM CHLORIDE 0.9 % IV BOLUS (SEPSIS): 500.0000 mL | Freq: Once | INTRAVENOUS | Status: DC

## 2015-02-10 MED ORDER — HEPARIN SODIUM (PORCINE) 1000 UNIT/ML DIALYSIS: 1000.0000 [IU] | INTRAMUSCULAR | Status: DC | PRN

## 2015-02-10 MED ORDER — HEPARIN (PORCINE) IN NACL 100-0.45 UNIT/ML-% IJ SOLN
1050.0000 [IU]/h | INTRAMUSCULAR | Status: DC
  Administered 2015-02-10: 700 [IU]/h via INTRAVENOUS
  Administered 2015-02-11: 1050 [IU]/h via INTRAVENOUS
  Filled 2015-02-10 (×3): qty 250

## 2015-02-10 MED ORDER — NEPRO/CARBSTEADY PO LIQD
237.0000 mL | Freq: Two times a day (BID) | ORAL | Status: DC
  Filled 2015-02-10 (×7): qty 237

## 2015-02-10 MED ORDER — ALTEPLASE 2 MG IJ SOLR
2.0000 mg | Freq: Once | INTRAMUSCULAR | Status: DC | PRN
  Filled 2015-02-10: qty 2

## 2015-02-10 NOTE — H&P (Signed)
PULMONARY / CRITICAL CARE MEDICINE   Name: Alison Gross MRN: [id number] DOB: 01-28-1933    ADMISSION DATE:  02/09/2015  CHIEF COMPLAINT:  Symptomatic bradycardia with hypotension  HISTORY OF PRESENT ILLNESS:   Alison Gross is a 81 y.o. female with h/o BRCA (in remission), ESRD dialysis MWF due to cardio-renal syndrome. Patient has h/o B pleural effusions, R pleurex catheter in place, and was drained in the office today by Dr. Servando Snare. He did note however on CXR that patients L sided pleural efusion has grown significantly since 1 month ago. Prior to dialysis today she had been in her usual state of health.  Two hours into HD she developed left sided stabbing, constant chest pain worse with movement and inhalation.  She was taken to the ED and developed bradycardia with associated hypotension requiring vasopressors and she was admitted to the MICU.  Her chest pain resolved with a single dose of morphine.  PAST MEDICAL HISTORY :   has a past medical history of Hypercholesterolemia; Thyroid disease; Hypertension; Dysrhythmia; Pneumonia; GERD (gastroesophageal reflux disease); Gastric ulcer; History of blood transfusion; History of colon polyps; Anemia; Diabetes mellitus without complication ([place]); History of radiation therapy (07/12/12-08/26/12); Paroxysmal atrial fibrillation ([place]); Breast cancer ([place]) (04/12/12); Shortness of breath on exertion; Bruit of left carotid artery; Acute upper GI bleeding (01/24/2012); MGUS (monoclonal gammopathy of unknown significance) (04/21/2013); Osteoporosis (11/29/2013); Recurrent right pleural effusion (07/26/2014); Goiter (07/27/2014); Substernal goiter (12/23/2012); Pleural effusion, right (12/23/2012); Dysphonia (09/20/2014); Vocal cord paralysis; CHF (congestive heart failure) ([place]); Stage III chronic kidney disease (07/26/2014); Type 2 diabetes mellitus with renal manifestations ([place]) (07/26/2014); and ESRD (end stage renal disease) ([place]).  has past surgical  history that includes Carpal tunnel release (Left); Breast biopsy; Esophagogastroduodenoscopy (01/24/2012); Thyroid surgery; Cystectomy; Knee surgery; Esophagogastroduodenoscopy; Colonoscopy; Total mastectomy (Bilateral, 05/10/2012); Axillary sentinel node biopsy (Right, 05/10/2012); Abdominal hysterectomy; Chest tube insertion (Right, 09/27/2014); Insertion of dialysis catheter (N/A, 51/88/4166); and Bascilic vein transposition (Left, 02/06/2015). Prior to Admission medications   Medication Sig Start Date End Date Taking? Authorizing Provider  acetaminophen (TYLENOL) 325 MG tablet Take 2 tablets (650 mg total) by mouth every 4 (four) hours as needed for headache or mild pain. 01/09/15  Yes Silver Huguenin Elgergawy, MD  anastrozole (ARIMIDEX) 1 MG tablet TAKE 1 TABLET (1 MG TOTAL) BY MOUTH DAILY. 10/17/14  Yes Laurie Panda, NP  digoxin (LANOXIN) 0.125 MG tablet Please Take one tablet oral daily from Monday to Friday, not on Saturday or Sunday. Patient taking differently: Take 0.125 mg by mouth See admin instructions. Please Take one tablet oral daily from Monday to Friday, not on Saturday or Sunday. 01/09/15  Yes Albertine Patricia, MD  methimazole (TAPAZOLE) 10 MG tablet Take 2 tablets (20 mg total) by mouth daily. 01/09/15  Yes Albertine Patricia, MD  midodrine (PROAMATINE) 10 MG tablet Take 1 tablet (10 mg total) by mouth 3 (three) times daily with meals. 01/09/15  Yes Albertine Patricia, MD  Multiple Vitamin (MULTIVITAMIN WITH MINERALS) TABS Take 1 tablet by mouth every other day.    Yes Historical Provider, MD  senna-docusate (SENOKOT-S) 8.6-50 MG tablet Take 1 tablet by mouth 2 (two) times daily. Patient taking differently: Take 1 tablet by mouth at bedtime as needed for moderate constipation.  01/09/15  Yes Albertine Patricia, MD  thiamine 100 MG tablet Take 1 tablet (100 mg total) by mouth daily. 11/04/14  Yes Adrian Prows, MD  VENTOLIN HFA 108 (90 BASE) MCG/ACT inhaler Inhale 2 puffs into the  lungs every 4  (four) hours as needed for wheezing or shortness of breath.  10/05/14  Yes Historical Provider, MD  warfarin (COUMADIN) 0.5 mg TABS tablet Take 0.5 mg by mouth daily at 6 PM.   Yes Historical Provider, MD   No Known Allergies  FAMILY HISTORY:  indicated that her mother is deceased. She indicated that her father is deceased. She indicated that her sister is alive. She indicated that her brother is alive. She indicated that her maternal grandmother is deceased. She indicated that her maternal grandfather is deceased. She indicated that her paternal grandmother is deceased. She indicated that her paternal grandfather is deceased.  SOCIAL HISTORY:  reports that she quit smoking about 43 years ago. Her smoking use included Cigarettes. She smoked 0.00 packs per day. She has never used smokeless tobacco. She reports that she does not drink alcohol or use illicit drugs.  REVIEW OF SYSTEMS:   Negative: Fever, chills, Diarrhea, hot/cold intolerance, sick contacts, cough, wheezing. Positive: Nausea, chest pain, pleuritic chest pain, constipation.  SUBJECTIVE:   VITAL SIGNS: Temp:  [98.2 F (36.8 C)] 98.2 F (36.8 C) (12/02 1513) Pulse Rate:  [45-84] 68 (12/02 2340) Resp:  [6-22] 12 (12/02 2340) BP: (67-122)/(34-65) 99/65 mmHg (12/02 2340) SpO2:  [95 %-99 %] 98 % (12/02 2340) Weight:  [49.896 kg (110 lb)] 49.896 kg (110 lb) (12/02 1513) HEMODYNAMICS:   VENTILATOR SETTINGS:   INTAKE / OUTPUT: No intake or output data in the 24 hours ending 02/10/15 0014  PHYSICAL EXAMINATION: General:  NAD Neuro:  CN II-XII intact, no focal deficits HEENT:  MMM Cardiovascular:  RRR, s1/s2 no m/r/g Lungs:  Decreased breath sounds left chest, with increased dullness to percussion and decreased fremitus.  Right Pleurex catheter in place.  Right sided Perm cath in place Abdomen:  Soft, NT non distended. Normal bowel sounds Skin:  No rashes Ext: Left arm fistula with bruit and thrill.  LABS:  CBC  Recent  Labs Lab 02/06/15 0901 02/09/15 1634 02/09/15 2222  WBC  --  7.8  --   HGB 10.9* 9.8* 10.5*  HCT 32.0* 31.4* 31.0*  PLT  --  195  --    Coag's  Recent Labs Lab 02/06/15 0906 02/09/15 1634  APTT 168*  --   INR 1.18 1.10   BMET  Recent Labs Lab 02/06/15 0901 02/09/15 1634 02/09/15 2222  NA 136 139 138  K 4.3 4.1 4.2  CL  --  99* 100*  CO2  --  31  --   BUN  --  12 19  CREATININE  --  2.69* 2.90*  GLUCOSE 75 97 92   Electrolytes  Recent Labs Lab 02/09/15 1634  CALCIUM 8.5*   Sepsis Markers No results for input(s): LATICACIDVEN, PROCALCITON, O2SATVEN in the last 168 hours. ABG No results for input(s): PHART, PCO2ART, PO2ART in the last 168 hours. Liver Enzymes  Recent Labs Lab 02/09/15 1634  AST 27  ALT 27  ALKPHOS 143*  BILITOT 0.5  ALBUMIN 2.2*   Cardiac Enzymes No results for input(s): TROPONINI, PROBNP in the last 168 hours. Glucose  Recent Labs Lab 02/06/15 1123 02/06/15 1150 02/06/15 1153 02/06/15 1155 02/09/15 2152  GLUCAP 66 66 44* 72 93    Imaging Dg Chest 2 View  02/09/2015  CLINICAL DATA:  Chest pain, shortness of breath. EXAM: CHEST  2 VIEW COMPARISON:  February 08, 2015. FINDINGS: Stable presence of large right peritracheal mass consistent with thyroid goiter. Stable moderate left pleural effusion is noted  with probable underlying atelectasis. No pneumothorax is noted. Right-sided dialysis catheter is unchanged in position, with distal tips in expected position of the SVC. Right axillary surgical clips are noted. Mild right pleural effusion is noted. Bony thorax is unremarkable. Stable position of right-sided pleural drainage catheter is noted. IMPRESSION: Stable bilateral pleural effusions are noted, with left greater than right. Stable position of right-sided pleural drainage catheter, as well as right-sided dialysis catheter. No significant changes noted compared to prior exam. Electronically Signed   By: Marijo Conception, M.D.   On:  02/09/2015 16:06   Ct Angio Chest Pe W/cm &/or Wo Cm  02/09/2015  CLINICAL DATA:  Severe left chest pain that began with dialysis today. Some shortness of breath. Right breast cancer. EXAM: CT ANGIOGRAPHY CHEST WITH CONTRAST TECHNIQUE: Multidetector CT imaging of the chest was performed using the standard protocol during bolus administration of intravenous contrast. Multiplanar CT image reconstructions and MIPs were obtained to evaluate the vascular anatomy. CONTRAST:  92m OMNIPAQUE IOHEXOL 350 MG/ML SOLN COMPARISON:  Chest radiographs obtained earlier today. Chest CT dated 09/19/2014. FINDINGS: Mediastinum/Lymph Nodes: 8 markedly enlarged and heterogeneous thyroid gland extending into the superior mediastinum bilaterally and into the medial upper right chest is again demonstrated. No enlarged lymph nodes. Normally opacified pulmonary arteries with no pulmonary arterial filling defects seen. Lungs/Pleura: Moderate-sized left pleural effusion, increased in amount. Small right pleural effusion, decreased in amount. Associated left greater than right lower lobe atelectasis. The mild bilateral bullous changes. Right lower lobe calcified granuloma. Right chest tube. Upper abdomen: Streak artifacts from the patient's arms. Mild diffuse intra-abdominal fat edema with sparing of fat in the lesser sac, without significant change. There is also a coarse calcification in that region without significant change. Diffuse subcutaneous fat edema. Musculoskeletal: Thoracic spine degenerative changes. Review of the MIP images confirms the above findings. IMPRESSION: 1. No pulmonary emboli. 2. Moderate-sized left pleural effusion, increased. 3. Small to moderate-sized right pleural effusion, decreased. 4. Bilateral compressive lower lobe atelectasis, greater on the left. 5. Stable huge thyroid goiter with substernal extension and extension into the medial right upper chest. 6. Diffuse anasarca with continued sparing of fatty in  the lesser sac, possibly due to lipoma formation. Electronically Signed   By: SClaudie ReveringM.D.   On: 02/09/2015 19:59     ASSESSMENT / PLAN: [id number] with ESRD, left sided pleuritic chest pain likely 2/2 pleural effusion and symptomatic bradycardia of unclear etiology. Perhaps related to hypovolemia.  CARDIOVASCULAR A: Symtpomatic bradycardia with hypotension. P:   - 500cc bolus  - dig level within therapeutic range  - EKG  - trend troponin weakly positive to be expected in ESRD  - Cardiology consulted, appreciate recs  - hold digoxin and other nodal blocking agents for now  - Telemetry  - trend lactic acid  - blood cultures  - Hold abx for now as infection seems less likely given paucity of symtpoms  - if Hypotension persists or e/o shock will start vanc/zosyn  RENAL A:  ESRD on M/W/F HD.  Completed 2 hours today, unknown volume removal P:    - consult to nephrology in AM  - No emergent need for HD  HEMATOLOGIC A:  Anemia of chronic disease. P:   - stable  - Iron supplementation   - tx per nephrology  INFECTIOUS A:  Currently no e/o septic physiology. P:   BCx2 02/10/2015 UC 02/09/2015 Abx: Holding for now, if fever or other decompensation will start vanc/zosyn  ENDOCRINE  A:  Query Adrenal insufficiency given hypotension, though this seems unlikely.   P:    - 8AM cortisol level  FAMILY  - Updates: Spoke with daughter and husband at bedside in the ED.   Total critical care time: 45 min  Critical care time was exclusive of separately billable procedures and treating other patients.  Critical care was necessary to treat or prevent imminent or life-threatening deterioration.  Critical care was time spent personally by me on the following activities: development of treatment plan with patient and/or surrogate as well as nursing, discussions with consultants, evaluation of patient's response to treatment, examination of patient, obtaining history from patient or  surrogate, ordering and performing treatments and interventions, ordering and review of laboratory studies, ordering and review of radiographic studies, pulse oximetry and re-evaluation of patient's condition.   Meribeth Mattes, DO., MS [facility]     [facility] [place] Pager: [phone]  02/10/2015, 12:14 AM

## 2015-02-10 NOTE — Progress Notes (Signed)
Subjective:  Patient was undergoing hemodialysis, complained of severe sharp left-sided chest pain, hence transferred to the hospital where she was found to be in junctional rhythm. She was admitted overnight for further evaluation.  Objective:  Vital Signs in the last 24 hours: Temp:  [98.2 F (36.8 C)-99.3 F (37.4 C)] 99.1 F (37.3 C) (12/03 0816) Pulse Rate:  [45-84] 66 (12/03 0700) Resp:  [0-27] 22 (12/03 0700) BP: (67-141)/(34-65) 106/44 mmHg (12/03 0700) SpO2:  [95 %-100 %] 100 % (12/03 0700) Weight:  [49.896 kg (110 lb)-52 kg (114 lb 10.2 oz)] 52 kg (114 lb 10.2 oz) (12/03 0403)  Intake/Output from previous day: 12/02 0701 - 12/03 0700 In: 62.9 [P.O.:30; I.V.:32.9] Out: -   Physical Exam: General appearance: alert, cooperative, appears stated age, no distress and appears wasted Eyes: negative findings: lids and lashes normal Neck: no carotid bruit, no JVD and supple, symmetrical, trachea midline Neck: JVP - normal, carotids 2+= without bruits Resp: decreased breath sounds bilaterally more than halfway at the bases. No wheeze.  Chest wall: no tenderness Cardio: S1, S2 normal, no S3 or S4 and systolic murmur: early systolic 2-3/5, crescendo at lower left sternal border, at apex GI: soft, non-tender; bowel sounds normal; no masses, no organomegaly Extremities: LE edema 2+, nontender. Full range of motion in extremities. Right upper extremity 3 plus edema (IV fluid infiltration)   Lab Results: BMP  Recent Labs  01/10/15 0216  02/09/15 1634 02/09/15 2222 02/10/15 0411  NA 131*  < > 139 138 138  K 3.9  < > 4.1 4.2 4.5  CL 98*  --  99* 100* 101  CO2 25  --  31  --  28  GLUCOSE 113*  < > 97 92 107*  BUN 55*  --  '12 19 16  '$ CREATININE 4.82*  --  2.69* 2.90* 3.06*  CALCIUM 8.6*  --  8.5*  --  8.4*  GFRNONAA 8*  --  16*  --  13*  GFRAA 9*  --  18*  --  16*  < > = values in this interval not displayed.  CBC  Recent Labs Lab 02/09/15 1634  02/10/15 0411  WBC 7.8   --  9.2  RBC 3.53*  --  3.31*  HGB 9.8*  < > 9.5*  HCT 31.4*  < > 29.6*  PLT 195  --  158  MCV 89.0  --  89.4  MCH 27.8  --  28.7  MCHC 31.2  --  32.1  RDW 17.2*  --  17.3*  LYMPHSABS 0.9  --   --   MONOABS 0.7  --   --   EOSABS 0.8*  --   --   BASOSABS 0.0  --   --   < > = values in this interval not displayed.  HEMOGLOBIN A1C Lab Results  Component Value Date   HGBA1C 6.2* 07/27/2014   MPG 131 07/27/2014    TSH  Recent Labs  07/27/14 0515 08/23/14 0822 01/01/15 1535  TSH 1.036 1.115 <0.010*     Recent Labs  07/26/14 1825  08/24/14 1420  11/07/14 1206 01/01/15 1125  01/09/15 0310 01/10/15 0216 02/09/15 1634  PROT 7.1  < > 6.1*  < > 6.6 5.5*  --   --   --  5.5*  ALBUMIN 3.5  < > 3.0*  < > 2.7* 2.6*  < > 2.1* 2.1* 2.2*  AST 38  < > 24  < > 37 28  --   --   --  27  ALT 33  < > 15  < > 35 30  --   --   --  27  ALKPHOS 85  < > 65  < > 83 93  --   --   --  143*  BILITOT 0.9  < > 0.6  < > 1.0 0.3  --   --   --  0.5  BILIDIR 0.2  --  <0.1*  --   --   --   --   --   --   --   IBILI 0.7  --  NOT CALCULATED  --   --   --   --   --   --   --   < > = values in this interval not displayed.  Scheduled Meds: . anastrozole  1 mg Oral Daily  . heparin  1,500 Units Intravenous Once  . methimazole  20 mg Oral Daily  . midodrine  10 mg Oral TID WC  . multivitamin with minerals  1 tablet Oral QODAY  . sodium chloride  500 mL Intravenous Once  . thiamine  100 mg Oral Daily  . warfarin  2 mg Oral Once   Continuous Infusions: . heparin 700 Units/hr (02/10/15 0400)  . norepinephrine (LEVOPHED) Adult infusion 2 mcg/min (02/10/15 0600)   PRN Meds:.sodium chloride, acetaminophen, albuterol, HYDROcodone-acetaminophen, ondansetron (ZOFRAN) IV   Cardiac Studies: EKG 02/09/2015: Normal sinus rhythm, left axis deviation, left plantar physical block. Poor R progression, LVH with repolarization of normality, cannot exclude lateral ischemia.  Echocardiogram 01/02/2015: Left  ventricle: The cavity size was normal. Wall thickness wasincreased in a pattern of moderate LVH. Systolic function wasnormal. The estimated ejection fraction was in the range of 60%to 65%. Wall motion was normal; there were no regional wallmotion abnormalities. - Aortic valve: There was mild regurgitation. Valve area (VTI):1.87 cm^2. Valve area (Vmax): 1.8 cm^2. Valve area (Vmean): 1.88 cm^2. - Mitral valve: Valve area by continuity equation (using LVOTflow): 1.76 cm^2. - Left atrium: The atrium was moderately dilated. - Right atrium: The atrium was moderately dilated. - Pulmonary arteries: Systolic pressure was severely increased. PA peak pressure: 72 mm Hg (S). - Pericardium, extracardiac: A small, free-flowing pericardialeffusion was identified circumferential to the heart. There wasno evidence of hemodynamic compromise.  Echocardiogram 09/26/2014: LVEF 60-65% with grade 2 diastolic dysfunction, quadricuspid aortic valve, mild mitral regurgitation, severe left atrial enlargement. Lexiscan sestamibi stress test 09/25/2014: Perfusion: Small fixed defect at the inferior lateral wall on rest and perfusion images. No reversible ischemia identified. Wall Motion: Normal left ventricular wall motion. No left ventricular dilation. Left Ventricular Ejection Fraction: 62 %  Assessment/Plan:   1. Pleuritic left-sided chest pain, serum troponin i-STAT negative, chest pain-free this morning 2. Junctional rhythm on admission, I do not have rhythm strips, patient is presently normal sinus rhythm. 3. Recurrent right pleural effusion, worse than the left pleural effusion, due to mediastinal mass/thyroid mass not a candidate for surgery. Has right Pleurx catheter in place 4. Paroxysmal atrial fibrillation 5. End-stage not disease on hemodialysis 6. Severe anemia and hypoproteinemia 7. History of breast cancer status post bilateral mastectomy 2014 8. Chronic diastolic heart failure,  echocardiogram findings are suggestive of infiltrative cardiomyopathy with restrictive physiology 9. Severe pulmonary hypertension secondary to underlying mediastinal and thoracic issues.  Recommendation: Patient is not a candidate for any cardiac evaluation given her multiple medical comorbidities. Chest and also appears clearly pleuritic Chest pain. Would not recommend any further evaluation. With regard to junctional rhythm, will discontinue  digoxin, digital was in the upper limit of normal. She is maintaining sinus rhythm. Not a candidate for ace inhibitors due to hyperkalemia and renal failure, beta blockers are held due to low blood pressure and bradycardia and heart block. Extremely guarded long-term prognosis. Please call if questions.   Adrian Prows, M.D. 02/10/2015, 11:35 AM [facility] Pager: [phone] Office: [phone] If no answer: [phone]

## 2015-02-10 NOTE — Procedures (Signed)
  I was present at this dialysis session, have reviewed the session itself and made  appropriate changes Kelly Splinter MD [place] pager [phone]    cell [phone] 02/10/2015, 3:46 PM

## 2015-02-10 NOTE — Progress Notes (Signed)
ANTICOAGULATION CONSULT NOTE - Initial Consult  Pharmacy Consult for Heparin in addition to warfarin Indication: atrial fibrillation  No Known Allergies  Patient Measurements: Height: '5\' 5"'$  (165.1 cm) Weight: 110 lb (49.896 kg) IBW/kg (Calculated) : 57  Vital Signs: Temp: 98.2 F (36.8 C) (12/02 1513) Temp Source: Oral (12/02 1513) BP: 99/65 mmHg (12/02 2340) Pulse Rate: 68 (12/02 2340)  Labs:  Recent Labs  02/09/15 1634 02/09/15 2222  HGB 9.8* 10.5*  HCT 31.4* 31.0*  PLT 195  --   LABPROT 14.4  --   INR 1.10  --   CREATININE 2.69* 2.90*    Estimated Creatinine Clearance: 12.2 mL/min (by C-G formula based on Cr of 2.9).   Medical History: Past Medical History  Diagnosis Date  . Hypercholesterolemia     takes Crestor daily  . Thyroid disease     had iodine radiation  . Hypertension     takes Hyzaar daily  . Dysrhythmia     takes Carvedilol daily  . Pneumonia     history of;last time about 4-80yr ago  . GERD (gastroesophageal reflux disease)     takes Protonix daily  . Gastric ulcer   . History of blood transfusion   . History of colon polyps   . Anemia     takes iron pill daily  . [place]     takes Tradjenta daily  . History of radiation therapy 07/12/12-08/26/12    right breast/  . [place]     PCP EKG 09/27/2013: A. Fibrillation.. EKG 09/29/2013: S. Tach.  . Breast cancer ([place] 04/12/12    right-pos lymph node/left-DCIS  . Shortness of breath on exertion   . Bruit of left carotid artery     12[facility]06[facility]13 [facility]  . Acute upper GI bleeding 01/24/2012  . MGUS (monoclonal gammopathy of unknown significance) 04/21/2013  . Osteoporosis 11/29/2013  . Recurrent right pleural effusion 07/26/2014  . Goiter 07/27/2014  . Substernal goiter 12/23/2012  . Pleural effusion, right 12/23/2012  . Dysphonia 09/20/2014  . Vocal cord paralysis     Suspected due to massive  substernal goiter  . CHF (congestive heart failure) ([place]   . Stage III chronic kidney disease 07/26/2014  . Type 2 diabetes mellitus with renal manifestations ([place] 07/26/2014  . ESRD (end stage renal disease) ([place]     On Hemodialysis, MWF    Medications:  See electronic med rec  Assessment: [id number] who presented with L sided chest pain. Pharmacy consulted earlier to resume home Coumadin therapy for Afib. H/H low, Plt wnl. INR on admission is sub-therapeutic at 1.1.  Home Coumadin dose: 0.5 mg daily   Pt with bradycardia and in/out of junctional rhythm and hypotensive requiring Levophed. CCM now ordering heparin per pharmacy in addition to coumadin for afib. Pt with ESRD. Noted pt with h/o b/l pleural effusions and has R pleurex cath in place - drained 12/2 by Dr. GServando Snare  Goal of Therapy:  Heparin level 0.3-0.7 units/ml INR 2-3 Monitor platelets by anticoagulation protocol: Yes   Plan:  Heparin IV bolus 2500 units Heparin gtt at 700 units/hr Will f/u 8hr heparin level Daily heparin level and CBC  CSherlon Handing PharmD, BCPS Clinical pharmacist, pager [phone]/05/2014 12:36 AM

## 2015-02-10 NOTE — Progress Notes (Addendum)
PULMONARY / CRITICAL CARE MEDICINE   Name: Alison Gross MRN: [id number] DOB: 03/03/1933    ADMISSION DATE:  02/09/2015  CHIEF COMPLAINT:  Symptomatic bradycardia with hypotension  SUBJECTIVE: No chest pain or pressure. No dyspnea or cough. Completing round of hemodialysis.  REVIEW OF SYSTEMS:  Denies any fever, chills, or sweats currently. No nausea or emesis currently.  VITAL SIGNS: Temp:  [97.6 F (36.4 C)-99.6 F (37.6 C)] 99.6 F (37.6 C) (12/03 1953) Pulse Rate:  [45-88] 78 (12/03 1830) Resp:  [0-30] 25 (12/03 1830) BP: (67-141)/(34-72) 95/56 mmHg (12/03 1830) SpO2:  [87 %-100 %] 100 % (12/03 1830) Weight:  [106 lb 14.8 oz (48.5 kg)-114 lb 10.2 oz (52 kg)] 106 lb 14.8 oz (48.5 kg) (12/03 1620) HEMODYNAMICS:   VENTILATOR SETTINGS:   INTAKE / OUTPUT:  Intake/Output Summary (Last 24 hours) at 02/10/15 2023 Last data filed at 02/10/15 1800  Gross per 24 hour  Intake 768.85 ml  Output   3000 ml  Net -2231.15 ml    PHYSICAL EXAMINATION: General:  Awake. Alert. No acute distress. Watching TV.  Integument:  Warm & dry. No rash on exposed skin.  HEENT:  Moist mucus membranes. No oral ulcers. PERRL. Cardiovascular:  Regular rate. No edema. No appreciable JVD.  Pulmonary:  Right tunnel pleural catheter in place. Decreased breath sounds are present dullness to percussion over the left lower lung zone. Normal work of breathing. Neurological: Cranial nerves grossly intact. Moving all 4 extremities equally.   LABS:  CBC  Recent Labs Lab 02/09/15 1634 02/09/15 2222 02/10/15 0411  WBC 7.8  --  9.2  HGB 9.8* 10.5* 9.5*  HCT 31.4* 31.0* 29.6*  PLT 195  --  158   Coag's  Recent Labs Lab 02/06/15 0906 02/09/15 1634 02/10/15 0411 02/10/15 0440  APTT 168*  --   --  22*  INR 1.18 1.10 1.06  --    BMET  Recent Labs Lab 02/09/15 1634 02/09/15 2222 02/10/15 0411  NA 139 138 138  K 4.1 4.2 4.5  CL 99* 100* 101  CO2 31  --  28  BUN '12 19 16  '$ CREATININE  2.69* 2.90* 3.06*  GLUCOSE 97 92 107*   Electrolytes  Recent Labs Lab 02/09/15 1634 02/10/15 0411  CALCIUM 8.5* 8.4*  MG  --  1.9  PHOS  --  4.3   Sepsis Markers  Recent Labs Lab 02/10/15 0423  LATICACIDVEN 1.4   ABG No results for input(s): PHART, PCO2ART, PO2ART in the last 168 hours. Liver Enzymes  Recent Labs Lab 02/09/15 1634  AST 27  ALT 27  ALKPHOS 143*  BILITOT 0.5  ALBUMIN 2.2*   Cardiac Enzymes No results for input(s): TROPONINI, PROBNP in the last 168 hours. Glucose  Recent Labs Lab 02/06/15 1123 02/06/15 1150 02/06/15 1153 02/06/15 1155 02/09/15 2152 02/10/15 0219  GLUCAP 66 66 44* 72 93 98    Imaging Dg Chest Port 1 View  02/10/2015  CLINICAL DATA:  Dyspnea and cough.  Central line placement. EXAM: PORTABLE CHEST 1 VIEW COMPARISON:  02/09/2015 FINDINGS: There is a new left jugular central line with tip at the expected location of the SVC near the azygos vein junction dual-lumen right-sided central line again noted. Right chest tube is unchanged in position. No pneumothorax. Dense consolidation and effusion persist on the left, as well as the smaller right-sided effusion. Moderate vascular congestion is present, as well as central ground-glass opacities and this is mildly worsened from the earlier study. IMPRESSION: 1.  New left jugular central line.  No pneumothorax. 2. Worsened ground-glass opacities and vascular congestion bilaterally. This may be partially due to supine positioning of the current study but a component of congestive failure or volume overload would also be a consideration. 3. Persistent dense consolidation and effusion on the left. Electronically Signed   By: Andreas Newport M.D.   On: 02/10/2015 01:48     ASSESSMENT / PLAN:  81yo AAF with ESRD, left sided pleuritic chest pain likely 2/2 pleural effusion and symptomatic bradycardia of unclear etiology.   CARDIOVASCULAR A:  Symtpomatic bradycardia with hypotension Paroxysmal  atrial fibrillation Chest Pain vs Pleurisy  P:  S/P IVF bolus Monitor on telemetry Cardiology consulted Holding digoxin Holding on anticoagulation May need leadless pacemaker Ruling out MI  RENAL A:   ESRD - M/W/F HD.    P:   Nephrology consulted Monitor electrolytes daily    HEMATOLOGIC A:   Anemia of chronic disease - No signs of active bleeding.  P:  Monitor Hgb daily with CBC Iron supplementation  INFECTIOUS A:   No acute issue.  P:   BCx2 02/10/2015 UC 02/09/2015 Abx: Holding for now, if fever or other decompensation will start vanc/zosyn  ENDOCRINE A:   Possible Adrenal insufficiency - 8am Cortisol 18.6    P:   Monitor glucose on Renal Panel daily.  FAMILY  - Updates: No family at bedside at the time of my exam.   TODAY'S SUMMARY:  81 year old female with known breast cancer. Symptoms significantly improved since admission. Continues to remain stable but still requiring vasopressor infusion.  I have spent an additional 11 minutes of critical care time today caring for the patient and reviewing the patient's electronic medical record.  Sonia Baller Ashok Cordia, M.D. [facility] Pager:  [phone] After 3pm or if no response, call [phone]  02/10/2015, 8:23 PM

## 2015-02-10 NOTE — ED Notes (Signed)
Waiting on radiology report before Central line is used. Hold on Heparin, Levo still going through peripheral line.

## 2015-02-10 NOTE — Progress Notes (Signed)
ANTICOAGULATION CONSULT NOTE - F/U Consult  Pharmacy Consult for Heparin  Indication: atrial fibrillation  No Known Allergies  Patient Measurements: Height: '5\' 5"'$  (165.1 cm) Weight: 106 lb 14.8 oz (48.5 kg) IBW/kg (Calculated) : 57  Vital Signs: Temp: 99.6 F (37.6 C) (12/03 1953) Temp Source: Oral (12/03 1953) BP: 102/52 mmHg (12/03 2100) Pulse Rate: 79 (12/03 2100)  Labs:  Recent Labs  02/09/15 1634 02/09/15 2222 02/10/15 0411 02/10/15 0440 02/10/15 1010 02/10/15 2156  HGB 9.8* 10.5* 9.5*  --   --   --   HCT 31.4* 31.0* 29.6*  --   --   --   PLT 195  --  158  --   --   --   APTT  --   --   --  22*  --   --   LABPROT 14.4  --  14.0  --   --   --   INR 1.10  --  1.06  --   --   --   HEPARINUNFRC  --   --   --   --  0.14* 0.15*  CREATININE 2.69* 2.90* 3.06*  --   --   --     Estimated Creatinine Clearance: 11.2 mL/min (by C-G formula based on Cr of 3.06).  Assessment: 64 YOF on warfarin PTA - currently holding. Heparin bridge. Heparin level 0.15 (subtherapeutic) on 850 units/hr. No issues with line or bleeding reported per RN.  Goal of Therapy:  Heparin level 0.3-0.7 units/ml INR 2-3 Monitor platelets by anticoagulation protocol: Yes   Plan:  Rebolus heparin 1500 units Increase heparin drip to 1050 units/hr Monitor for s/sx of bleeding F/u 8 hr heparin level F/U plans to restart warfarin  Sherlon Handing, PharmD, BCPS Clinical pharmacist, pager [phone] 02/10/2015 10:37 PM

## 2015-02-10 NOTE — Progress Notes (Signed)
This is actually a patient of Dr. Irven Shelling. I have spoken with him to notify him of her admission. Correne Lalani PA-C

## 2015-02-10 NOTE — Progress Notes (Signed)
ANTICOAGULATION CONSULT NOTE - F/U Consult  Pharmacy Consult for Heparin  Indication: atrial fibrillation  No Known Allergies  Patient Measurements: Height: 5' 5"  (165.1 cm) Weight: 114 lb 10.2 oz (52 kg) IBW/kg (Calculated) : 57  Vital Signs: Temp: 99.1 F (37.3 C) (12/03 0816) Temp Source: Oral (12/03 0816) BP: 106/44 mmHg (12/03 0700) Pulse Rate: 66 (12/03 0700)  Labs:  Recent Labs  02/09/15 1634 02/09/15 2222 02/10/15 0411 02/10/15 0440 02/10/15 1010  HGB 9.8* 10.5* 9.5*  --   --   HCT 31.4* 31.0* 29.6*  --   --   PLT 195  --  158  --   --   APTT  --   --   --  22*  --   LABPROT 14.4  --  14.0  --   --   INR 1.10  --  1.06  --   --   HEPARINUNFRC  --   --   --   --  0.14*  CREATININE 2.69* 2.90* 3.06*  --   --     Estimated Creatinine Clearance: 12 mL/min (by C-G formula based on Cr of 3.06).  Assessment: 89 YOF who presented with L sided chest pain starting 2 hrs into HD, having symptomatic bradycardia and hypotension  PMH: BRCA, ESRD HD MWF, R Pleurex cath in place (drained 12/2)  Anticoagulation: warfarin PTA. Holding now on heparin gtt. Heparin 700 units/hr on 700 units/hr  PTA warfarin 0.5 mg daily  Heme: Breast CA in remission. H&H 9.5/29.6, Plt 158  Goal of Therapy:  Heparin level 0.3-0.7 units/ml INR 2-3 Monitor platelets by anticoagulation protocol: Yes   Plan:  Heparin 1500 unit bolus followed by an increase in drip to 850 units.hr Monitor for s/sx of bleeding HL 2000 Daily HL, CBC F/U plans to restart warfarin  Levester Fresh, PharmD, BCPS, [facility] Clinical Pharmacist Pager [phone] 02/10/2015 10:40 AM

## 2015-02-10 NOTE — Consult Note (Signed)
Renal Service Consult Note [facility]  Alison Gross 02/10/2015 [place] Requesting Physician:  Dr Oletta Darter  Reason for Consult:  ESRD patient with flank pain/ L pleural effusion HPI: The patient is a 81 y.o. year-old with hx of BRCA (in remission), ESRD d/t cardio-renal on HD MWF. Hx DM2, PAF, MGUS. Hx of recurrent pleural effusions w indwelling R chest Pleurex catheter. CXR showed sig L sided effusion as well as ground glass changes on L>R c/w CHF. Had pleuritic CP , junctional rhythm and hypotension.  Admitted to ICU , on levophed. NSR on tele. Frequent PAC's. Seen by cardiology, prob L flank pain from pleurisy or pericarditis, but would r/o for MI. Asked to see for dialysis.     Chart review: Nov 13 - upper GIB, anemia, HTN, UTI, OA Mar 14 - R mod mastectomy for BRCA stage 2 and Left total mastectomy for BRCA stage 1, axillary SN biopsy w dissection on R May 16 - chron diast HF flare w SOB/ pulmedema, LV 60%. Rx w diuretics. [facility]as Lav w neg bone scan for malignancy. DM2, HTN, HL, paf, recurrent R pleural effusion, CKD 3 Jun 16 - HCAP rx w abx, chron R effusion , chron diast HF, HTN, DM2, PAF July 16 - large goiter compressing R lung, w swallow /speech changes. Seen by TCTS , high risk candidate for major surg (thyroidectomy). R effusion, pleurex cath 7.20. DVT RUE d/ t central line. DM2. CKD 3/4 Aug 16 - poor longterm prognosis, made DNR by DR Einar Gip.  Not a candidate for cor angio due to comorbidities. CKD stage 4 Oct 16 - a/c renal failure, started on HD using perm cath. Unable to place AVG due to persistent hypotension.  Can be electively scheduled as OP.  TTS as dc.    ROS  denies fevers, chills sweats, prod cough  no n/v/d   no abd pain  no probs with HD  has L arm AVF placed a few days ago  Past Medical History  Past Medical History  Diagnosis Date  . Hypercholesterolemia     takes Crestor daily  . Thyroid disease     had iodine radiation  . Hypertension      takes Hyzaar daily  . Dysrhythmia     takes Carvedilol daily  . Pneumonia     history of;last time about 4-19yr ago  . GERD (gastroesophageal reflux disease)     takes Protonix daily  . Gastric ulcer   . History of blood transfusion   . History of colon polyps   . Anemia     takes iron pill daily  . Diabetes [place] complication [place]     takes Tradjenta daily  . History of radiation therapy 07/12/12-08/26/12    right breast/  . Paroxysmal atrial fibrillation ([place]     PCP EKG 09/27/2013: A. Fibrillation.. EKG 09/29/2013: S. Tach.  . Breast cancer ([place] 04/12/12    right-pos lymph node/left-DCIS  . Shortness of breath on exertion   . Bruit of left carotid artery     12/06/13 carotid duplex: no sign stenosis [facility])  . Acute upper GI bleeding 01/24/2012  . MGUS (monoclonal gammopathy of unknown significance) 04/21/2013  . Osteoporosis 11/29/2013  . Recurrent right pleural effusion 07/26/2014  . Goiter 07/27/2014  . Substernal goiter 12/23/2012  . Pleural effusion, right 12/23/2012  . Dysphonia 09/20/2014  . Vocal cord paralysis     Suspected due to massive substernal goiter  . CHF (congestive heart failure) ([facility]   .  Stage III chronic kidney disease 07/26/2014  . Type 2 diabetes mellitus with renal manifestations ([place]) 07/26/2014  . ESRD (end stage renal disease) ([place])     On Hemodialysis, MWF   Past Surgical History  Past Surgical History  Procedure Laterality Date  . Carpal tunnel release Left   . Breast biopsy    . Esophagogastroduodenoscopy  01/24/2012    Procedure: ESOPHAGOGASTRODUODENOSCOPY (EGD);  Surgeon: Jeryl Columbia, MD;  Location: [facility];  Service: Endoscopy;  Laterality: N/A;  . Thyroid surgery      "removed goiter"  . Cystectomy      from back of neck  . Knee surgery      right with rods  . Esophagogastroduodenoscopy    . Colonoscopy    . Total mastectomy Bilateral 05/10/2012    Procedure: RIGHT modified mastectomy; LEFT total mastectomy;   Surgeon: [name], MD;  Location: [place];  Service: General;  Laterality: Bilateral;  . Axillary sentinel node biopsy Right 05/10/2012    Procedure: AXILLARY SENTINEL lymph  NODE BIOPSY;  Surgeon: [name], MD;  Location: [place];  Service: General;  Laterality: Right;  nuclear medicine injection right side  7:00 am   . Abdominal hysterectomy      fibroids, with bilateral SO  . Chest tube insertion Right 09/27/2014    Procedure: INSERTION OF RIGHT PLEURAL DRAINAGE CATHETER;  Surgeon: [name], MD;  Location: [place];  Service: Thoracic;  Laterality: Right;  . Insertion of dialysis catheter N/A 01/05/2015    Procedure: INSERTION OF DIALYSIS CATHETER;  Surgeon: Angelia Mould, MD;  Location: [place];  Service: Vascular;  Laterality: N/A;  . Bascilic vein transposition Left 02/06/2015    Procedure: FIRST STAGE BRACHIAL VEIN TRANSPOSITION;  Surgeon: Conrad Varnamtown, MD;  Location: [place];  Service: Vascular;  Laterality: Left;   Family History  Family History  Problem Relation Age of Onset  . Brain cancer Mother   . Aneurysm Father   . Diabetes Sister   . Diabetes Brother   . Diabetes Sister   . Diabetes Brother   . Heart failure Sister    Social History  reports that she quit smoking about 43 years ago. Her smoking use included Cigarettes. She smoked 0.00 packs per day. She has never used smokeless tobacco. She reports that she does not drink alcohol or use illicit drugs. Allergies No Known Allergies Home medications Prior to Admission medications   Medication Sig Start Date End Date Taking? Authorizing Provider  acetaminophen (TYLENOL) 325 MG tablet Take 2 tablets (650 mg total) by mouth every 4 (four) hours as needed for headache or mild pain. 01/09/15  Yes Silver Huguenin Elgergawy, MD  anastrozole (ARIMIDEX) 1 MG tablet TAKE 1 TABLET (1 MG TOTAL) BY MOUTH DAILY. 10/17/14  Yes Laurie Panda, NP  digoxin (LANOXIN) 0.125 MG tablet Please Take one tablet oral daily from  Monday to Friday, not on Saturday or Sunday. Patient taking differently: Take 0.125 mg by mouth See admin instructions. Please Take one tablet oral daily from Monday to Friday, not on Saturday or Sunday. 01/09/15  Yes Albertine Patricia, MD  methimazole (TAPAZOLE) 10 MG tablet Take 2 tablets (20 mg total) by mouth daily. 01/09/15  Yes Albertine Patricia, MD  midodrine (PROAMATINE) 10 MG tablet Take 1 tablet (10 mg total) by mouth 3 (three) times daily with meals. 01/09/15  Yes Albertine Patricia, MD  Multiple Vitamin (MULTIVITAMIN WITH MINERALS) TABS Take 1 tablet by  mouth every other day.    Yes Historical Provider, MD  senna-docusate (SENOKOT-S) 8.6-50 MG tablet Take 1 tablet by mouth 2 (two) times daily. Patient taking differently: Take 1 tablet by mouth at bedtime as needed for moderate constipation.  01/09/15  Yes Albertine Patricia, MD  thiamine 100 MG tablet Take 1 tablet (100 mg total) by mouth daily. 11/04/14  Yes Adrian Prows, MD  VENTOLIN HFA 108 (90 BASE) MCG/ACT inhaler Inhale 2 puffs into the lungs every 4 (four) hours as needed for wheezing or shortness of breath.  10/05/14  Yes Historical Provider, MD  warfarin (COUMADIN) 0.5 mg TABS tablet Take 0.5 mg by mouth daily at 6 PM.   Yes Historical Provider, MD   Liver Function Tests  Recent Labs Lab 02/09/15 1634  AST 27  ALT 27  ALKPHOS 143*  BILITOT 0.5  PROT 5.5*  ALBUMIN 2.2*    Recent Labs Lab 02/10/15 0440  LIPASE 24   CBC  Recent Labs Lab 02/09/15 1634 02/09/15 2222 02/10/15 0411  WBC 7.8  --  9.2  NEUTROABS 5.5  --   --   HGB 9.8* 10.5* 9.5*  HCT 31.4* 31.0* 29.6*  MCV 89.0  --  89.4  PLT 195  --  078   Basic Metabolic Panel  Recent Labs Lab 02/06/15 0901 02/09/15 1634 02/09/15 2222 02/10/15 0411  NA 136 139 138 138  K 4.3 4.1 4.2 4.5  CL  --  99* 100* 101  CO2  --  31  --  28  GLUCOSE 75 97 92 107*  BUN  --  12 19 16   CREATININE  --  2.69* 2.90* 3.06*  CALCIUM  --  8.5*  --  8.4*  PHOS  --   --    --  4.3    Filed Vitals:   02/10/15 0630 02/10/15 0645 02/10/15 0700 02/10/15 0816  BP: 111/53 113/48 106/44   Pulse: 67 65 66   Temp:    99.1 F (37.3 C)  TempSrc:    Oral  Resp: 19 14 22    Height:      Weight:      SpO2: 100% 100% 100%    Exam Alert, frail , elderly no distress No rash, cyanosis or gangrene Sclera anicteric, throat clear No jvd Chest clear on R, bronchial BS L base 1/3 up Cor irreg irreg 2/6 SEM no RG Abd soft ntnd protuberant, tympanitic +bs no gross ascites GU deferred LE +hip edema 2+ bilat LE's, 1+ distal LE edema RUE Edema 2+ R IJ cath Prior mastectomy L>R LUA AVF +bruit, wounds intact Neuro alert , Ox 3 pleasant  HD TTS   Assessment: 1 Flank pain/ pleural effusion - prob pleurisy vs diast HF (longstanding) 2 Volume has sig edema which is prob chronic issue; L lung looks wet on CXR possibly 3 Hypotens on midodrine 4 ESRD hd via cath, has new AVF left arm 5 Recurrent pleural eff, s/p pleurex R side 6 Hx chronic diast HF 7 BRCA bilat , in remission 8 Anemia Hb 8.5 9 MBD CA/ P ok 10 massive goiter 11 Chron afib on dig/ coum   Plan - HD today , try for 2-3kg. Not sure if extra vol is all 3rd- spaced or not.  Difficult managing volume w chron hypotension and afib.   Kelly Splinter MD [employer] pager [phone]    cell [phone] 02/10/2015, 11:17 AM

## 2015-02-11 DIAGNOSIS — I480 Paroxysmal atrial fibrillation: Secondary | ICD-10-CM

## 2015-02-11 DIAGNOSIS — R001 Bradycardia, unspecified: Secondary | ICD-10-CM

## 2015-02-11 DIAGNOSIS — I9589 Other hypotension: Secondary | ICD-10-CM

## 2015-02-11 DIAGNOSIS — N186 End stage renal disease: Secondary | ICD-10-CM

## 2015-02-11 DIAGNOSIS — R0781 Pleurodynia: Secondary | ICD-10-CM

## 2015-02-11 LAB — RENAL FUNCTION PANEL
ALBUMIN: 1.9 g/dL — AB (ref 3.5–5.0)
ANION GAP: 9 (ref 5–15)
BUN: 18 mg/dL (ref 6–20)
CHLORIDE: 100 mmol/L — AB (ref 101–111)
CO2: 27 mmol/L (ref 22–32)
Calcium: 8.4 mg/dL — ABNORMAL LOW (ref 8.9–10.3)
Creatinine, Ser: 3.13 mg/dL — ABNORMAL HIGH (ref 0.44–1.00)
GFR calc Af Amer: 15 mL/min — ABNORMAL LOW (ref >60)
GFR calc non Af Amer: 13 mL/min — ABNORMAL LOW (ref >60)
GLUCOSE: 131 mg/dL — AB (ref 65–99)
PHOSPHORUS: 4.7 mg/dL — AB (ref 2.5–4.6)
POTASSIUM: 4.5 mmol/L (ref 3.5–5.1)
Sodium: 136 mmol/L (ref 135–145)

## 2015-02-11 LAB — CBC
HEMATOCRIT: 29.5 % — AB (ref 36.0–46.0)
Hemoglobin: 9.1 g/dL — ABNORMAL LOW (ref 12.0–15.0)
MCH: 27.7 pg (ref 26.0–34.0)
MCHC: 30.8 g/dL (ref 30.0–36.0)
MCV: 89.9 fL (ref 78.0–100.0)
PLATELETS: 189 10*3/uL (ref 150–400)
RBC: 3.28 MIL/uL — ABNORMAL LOW (ref 3.87–5.11)
RDW: 17.1 % — AB (ref 11.5–15.5)
WBC: 8.7 10*3/uL (ref 4.0–10.5)

## 2015-02-11 LAB — MAGNESIUM: Magnesium: 2 mg/dL (ref 1.7–2.4)

## 2015-02-11 LAB — HEPATITIS B SURFACE ANTIGEN: Hepatitis B Surface Ag: NEGATIVE

## 2015-02-11 LAB — HEPARIN LEVEL (UNFRACTIONATED)
HEPARIN UNFRACTIONATED: 0.29 [IU]/mL — AB (ref 0.30–0.70)
Heparin Unfractionated: 0.39 IU/mL (ref 0.30–0.70)

## 2015-02-11 MED ORDER — SENNOSIDES-DOCUSATE SODIUM 8.6-50 MG PO TABS
1.0000 | ORAL_TABLET | Freq: Every evening | ORAL | Status: DC | PRN
  Administered 2015-02-11 – 2015-02-12 (×2): 1 via ORAL
  Filled 2015-02-11 (×3): qty 1

## 2015-02-11 MED ORDER — HEPARIN (PORCINE) IN NACL 100-0.45 UNIT/ML-% IJ SOLN
1250.0000 [IU]/h | INTRAMUSCULAR | Status: DC
  Administered 2015-02-11 – 2015-02-13 (×3): 1150 [IU]/h via INTRAVENOUS
  Administered 2015-02-14 (×2): 1250 [IU]/h via INTRAVENOUS
  Filled 2015-02-11 (×8): qty 250

## 2015-02-11 NOTE — Progress Notes (Signed)
[facility] Progress Note   Subjective: no SOB or cough. HR better, off dig now.   Filed Vitals:   02/11/15 0800 02/11/15 0839 02/11/15 0900 02/11/15 1000  BP: 128/60  105/77 128/67  Pulse: 76  75 73  Temp:  98.4 F (36.9 C)    TempSrc:  Oral    Resp: _0 Height:      Weight:      SpO2: 99%  99% 100%    Inpatient medications: . anastrozole  1 mg Oral Daily  . antiseptic oral rinse  7 mL Mouth Rinse BID  . feeding supplement (NEPRO CARB STEADY)  237 mL Oral BID BM  . heparin  2,000 Units Dialysis Once in dialysis  . methimazole  20 mg Oral Daily  . midodrine  10 mg Oral TID WC  . multivitamin with minerals  1 tablet Oral QODAY  . sodium chloride  500 mL Intravenous Once  . thiamine  100 mg Oral Daily  . warfarin  2 mg Oral Once   . heparin 1,150 Units/hr (02/11/15 1100)  . norepinephrine (LEVOPHED) Adult infusion 4.053 mcg/min (02/11/15 1100)   sodium chloride, sodium chloride, sodium chloride, acetaminophen, albuterol, alteplase, heparin, HYDROcodone-acetaminophen, lidocaine (PF), lidocaine-prilocaine, ondansetron (ZOFRAN) IV, pentafluoroprop-tetrafluoroeth  Exam: Alert no distress elderly No jvd Chest clear on R, bronchial BS L base 1/3 up Cor irreg irreg 2/6 SEM no RG Abd soft ntnd tympanitic +bs no gross ascites LE +hip edema 2+ bilat LE's, 1+ distal LE edema RUE Edema 2+ R IJ cath Prior mastectomy L>R LFA AVF +bruit, wounds intact Neuro alert , Ox 3 pleasant  SW MWF  4h  51.5kg   3/2.25 bath  Heparin 1600   R IJ / L FA AVG  Venofer 50/wk Mircera 150 q2 wks last 11/30  Assessment: 1 Flank pain/ new L pleural effusion - per primary 2 Bradycardia junct, symptomatic improved off dig, may need PPM 3 Recurrent pleural eff, s/p pleurex R side 4 Hypotens on midodrine 5 Vol^/ edema/ effusions- has been losing wt as OP progressive issue, under dry wt 6 ESRD hd via cath, has new AVF left arm  7 Chronic diast HF 8 BRCA bilat , in  remission 9 Anemia Hb 8.5 10 MBD CA/ P ok 11 Massive goiter/ intrathoracic extension - not surg candidate 12 Chron afib on coum, dig stopped d/t #2 13 Pulm HTN f/b Dr Einar Gip 14 Hx MGUS   Plan - HD Monday , get vol down as BP tolerates.   Kelly Splinter MD [facility] pager [phone]    cell [phone] 02/11/2015, 12:15 PM    Recent Labs Lab 02/09/15 1634 02/09/15 2222 02/10/15 0411 02/11/15 0522  NA 139 138 138 136  K 4.1 4.2 4.5 4.5  CL 99* 100* 101 100*  CO2 31  --  28 27  GLUCOSE 97 92 107* 131*  BUN _1 CREATININE 2.69* 2.90* 3.06* 3.13*  CALCIUM 8.5*  --  8.4* 8.4*  PHOS  --   --  4.3 4.7*    Recent Labs Lab 02/09/15 1634 02/11/15 0522  AST 27  --   ALT 27  --   ALKPHOS 143*  --   BILITOT 0.5  --   PROT 5.5*  --   ALBUMIN 2.2* 1.9*    Recent Labs Lab 02/09/15 1634 02/09/15 2222 02/10/15 0411 02/11/15 0522  WBC 7.8  --  9.2 8.7  NEUTROABS 5.5  --   --   --  HGB 9.8* 10.5* 9.5* 9.1*  HCT 31.4* 31.0* 29.6* 29.5*  MCV 89.0  --  89.4 89.9  PLT 195  --  158 189

## 2015-02-11 NOTE — Progress Notes (Signed)
ANTICOAGULATION CONSULT NOTE - F/U Consult  Pharmacy Consult for Heparin  Indication: atrial fibrillation  No Known Allergies  Patient Measurements: Height: '5\' 5"'$  (165.1 cm) Weight: 105 lb 2.6 oz (47.7 kg) IBW/kg (Calculated) : 57  Vital Signs: Temp: 98.8 F (37.1 C) (12/04 0354) Temp Source: Oral (12/04 0354) BP: 131/52 mmHg (12/04 0730) Pulse Rate: 74 (12/04 0730)  Labs:  Recent Labs  02/09/15 1634 02/09/15 2222 02/10/15 0411 02/10/15 0440 02/10/15 1010 02/10/15 2156 02/11/15 0522 02/11/15 0650  HGB 9.8* 10.5* 9.5*  --   --   --  9.1*  --   HCT 31.4* 31.0* 29.6*  --   --   --  29.5*  --   PLT 195  --  158  --   --   --  189  --   APTT  --   --   --  22*  --   --   --   --   LABPROT 14.4  --  14.0  --   --   --   --   --   INR 1.10  --  1.06  --   --   --   --   --   HEPARINUNFRC  --   --   --   --  0.14* 0.15*  --  0.29*  CREATININE 2.69* 2.90* 3.06*  --   --   --  3.13*  --     Estimated Creatinine Clearance: 10.8 mL/min (by C-G formula based on Cr of 3.13).  Assessment: 83 YOF on warfarin PTA for PAF- currently holding for possible PM placement. Heparin bridge. Heparin level 0.29 (subtherapeutic) on 1050 units/hr. No issues with line or bleeding reported per RN.  Goal of Therapy:  Heparin level 0.3-0.7 units/ml INR 2-3 Monitor platelets by anticoagulation protocol: Yes   Plan:  Increase heparin drip to 1150 units/hr Monitor for s/sx of bleeding F/u 8 hr heparin level F/U plans to restart warfarin  Kadeja Granada K. Velva Harman, PharmD, BCPS, CPP Clinical Pharmacist Pager: [phone] Phone: [phone] 02/11/2015 8:05 AM

## 2015-02-11 NOTE — Progress Notes (Signed)
ANTICOAGULATION CONSULT NOTE - F/U Consult  Pharmacy Consult for Heparin  Indication: atrial fibrillation  No Known Allergies  Patient Measurements: Height: '5\' 5"'$  (165.1 cm) Weight: 105 lb 2.6 oz (47.7 kg) IBW/kg (Calculated) : 57  Vital Signs: Temp: 99 F (37.2 C) (12/04 1641) Temp Source: Oral (12/04 1641) BP: 100/39 mmHg (12/04 1700) Pulse Rate: 71 (12/04 1800)  Labs:  Recent Labs  02/09/15 1634 02/09/15 2222 02/10/15 0411 02/10/15 0440  02/10/15 2156 02/11/15 0522 02/11/15 0650 02/11/15 1800  HGB 9.8* 10.5* 9.5*  --   --   --  9.1*  --   --   HCT 31.4* 31.0* 29.6*  --   --   --  29.5*  --   --   PLT 195  --  158  --   --   --  189  --   --   APTT  --   --   --  22*  --   --   --   --   --   LABPROT 14.4  --  14.0  --   --   --   --   --   --   INR 1.10  --  1.06  --   --   --   --   --   --   HEPARINUNFRC  --   --   --   --   < > 0.15*  --  0.29* 0.39  CREATININE 2.69* 2.90* 3.06*  --   --   --  3.13*  --   --   < > = values in this interval not displayed.  Estimated Creatinine Clearance: 10.8 mL/min (by C-G formula based on Cr of 3.13).  Assessment: 64 YOF on warfarin PTA for PAF- currently holding for possible PM placement. Heparin bridge. Heparin level 0.039 (therapeutic) on 1150 units/hr. No issues with line or bleeding reported per RN.  Goal of Therapy:  Heparin level 0.3-0.7 units/ml INR 2-3 Monitor platelets by anticoagulation protocol: Yes   Plan:  Continue heparin drip 1150 units/hr Monitor for s/sx of bleeding Daily HL, CBC F/U plans to restart warfarin  Levester Fresh, PharmD, BCPS, [facility] Clinical Pharmacist Pager [phone] 02/11/2015 6:36 PM

## 2015-02-11 NOTE — Progress Notes (Signed)
PULMONARY / CRITICAL CARE MEDICINE   Name: Alison Gross MRN: [id number] DOB: [birth year]    ADMISSION DATE:  02/09/2015  CHIEF COMPLAINT:  Symptomatic bradycardia with hypotension  SUBJECTIVE: No chest pain or pressure. Dyspnea unchanged at rest. No acute events overnight. Tolerated hemodialysis yesterday.  REVIEW OF SYSTEMS:  Denies any fever, chills, or sweats currently. No nausea or emesis currently.  VITAL SIGNS: Temp:  [98.4 F (36.9 C)-99.7 F (37.6 C)] 99.7 F (37.6 C) (12/04 2021) Pulse Rate:  [51-117] 60 (12/04 2100) Resp:  [12-27] 19 (12/04 2100) BP: (67-138)/(34-96) 121/50 mmHg (12/04 2100) SpO2:  [95 %-100 %] 100 % (12/04 2100) Weight:  [105 lb 2.6 oz (47.7 kg)] 105 lb 2.6 oz (47.7 kg) (12/04 0500) HEMODYNAMICS:   VENTILATOR SETTINGS:   INTAKE / OUTPUT:  Intake/Output Summary (Last 24 hours) at 02/11/15 2145 Last data filed at 02/11/15 2100  Gross per 24 hour  Intake 380.58 ml  Output    350 ml  Net  30.58 ml    PHYSICAL EXAMINATION: General:  Awake. Alert. Watching TV in bed.  Integument:  Warm & dry. No rash on exposed skin.  HEENT:  Moist mucus membranes. No oral ulcers. PERRL. Cardiovascular:  Regular rate. No appreciable JVD.  Pulmonary:  Right tunnel pleural catheter in place. Symmetric chest wall expansion with normal work of breathing. Neurological: Cranial nerves grossly intact. Moving all 4 extremities equally.   LABS:  CBC  Recent Labs Lab 02/09/15 1634 02/09/15 2222 02/10/15 0411 02/11/15 0522  WBC 7.8  --  9.2 8.7  HGB 9.8* 10.5* 9.5* 9.1*  HCT 31.4* 31.0* 29.6* 29.5*  PLT 195  --  158 189   Coag's  Recent Labs Lab 02/06/15 0906 02/09/15 1634 02/10/15 0411 02/10/15 0440  APTT 168*  --   --  22*  INR 1.18 1.10 1.06  --    BMET  Recent Labs Lab 02/09/15 1634 02/09/15 2222 02/10/15 0411 02/11/15 0522  NA 139 138 138 136  K 4.1 4.2 4.5 4.5  CL 99* 100* 101 100*  CO2 31  --  28 27  BUN '12 19 16 18  '$ CREATININE  2.69* 2.90* 3.06* 3.13*  GLUCOSE 97 92 107* 131*   Electrolytes  Recent Labs Lab 02/09/15 1634 02/10/15 0411 02/11/15 0522  CALCIUM 8.5* 8.4* 8.4*  MG  --  1.9 2.0  PHOS  --  4.3 4.7*   Sepsis Markers  Recent Labs Lab 02/10/15 0423  LATICACIDVEN 1.4   ABG No results for input(s): PHART, PCO2ART, PO2ART in the last 168 hours. Liver Enzymes  Recent Labs Lab 02/09/15 1634 02/11/15 0522  AST 27  --   ALT 27  --   ALKPHOS 143*  --   BILITOT 0.5  --   ALBUMIN 2.2* 1.9*   Cardiac Enzymes No results for input(s): TROPONINI, PROBNP in the last 168 hours. Glucose  Recent Labs Lab 02/06/15 1123 02/06/15 1150 02/06/15 1153 02/06/15 1155 02/09/15 2152 02/10/15 0219  GLUCAP 66 66 44* 72 93 98    Imaging No results found.   ASSESSMENT / PLAN:  81yo AAF with ESRD, left sided pleuritic chest pain likely 2/2 pleural effusion and symptomatic bradycardia of unclear etiology. Patient's flank pain has significantly improved. She continues to wean on vasopressor support at this time. Case was discussed with nephrology today.  1. Symptomatic bradycardia with hypotension: Bradycardia resolved but hypotension persists. Continuing midodrine by mouth 3 times a day. Continuing to wean vasopressor support. Continuing to hold digoxin.  Patient may need leadless pacemaker. Cardiology following. Monitoring patient on telemetry. 2. Paroxysmal atrial fibrillation: Holding Coumadin. Heparin drip per pharmacy protocol. Continuing to monitor on telemetry.  3. End-stage renal disease: Hemodialysis per Nephrology recommendations. Continuing to monitor electrolytes daily. 4. Chest pain/pleurisy: Resolved. 5. History of GERD with gastric ulcer: Monitor hemoglobin daily with CBC. 6. Bilateral pleural effusions: Status post right Pleurx catheter placement. Left pleural effusion continuing to enlarge. Holding on left thoracentesis at this time with ongoing ultrafiltration with  dialysis. 7. Hyperthyroidism: Continuing methimazole by mouth daily. 8. History of breast cancer: Continuing Arimidex. 9. History of diabetes mellitus/hypoglycemia: Patient had hypoglycemia to 44 on admission. Now stable. 10. Prophylaxis: Systemic anticoagulation with heparin drip per pharmacy protocol. 11. Diet: Renal diet with fluid restriction.   I have spent a total of 79 minutes of critical care time today caring for the patient & reviewing the patient's electronic medical record as well as discussing the plan of care with nephrology.  Sonia Baller Ashok Cordia, M.D. [facility] Pulmonary & Critical Care Pager:  [phone] After 3pm or if no response, call [phone]  02/11/2015, 9:45 PM

## 2015-02-12 ENCOUNTER — Inpatient Hospital Stay (HOSPITAL_COMMUNITY): Payer: Commercial Managed Care - HMO

## 2015-02-12 ENCOUNTER — Other Ambulatory Visit: Payer: Self-pay | Admitting: *Deleted

## 2015-02-12 DIAGNOSIS — J90 Pleural effusion, not elsewhere classified: Principal | ICD-10-CM

## 2015-02-12 LAB — RENAL FUNCTION PANEL
ALBUMIN: 1.9 g/dL — AB (ref 3.5–5.0)
Anion gap: 7 (ref 5–15)
BUN: 27 mg/dL — ABNORMAL HIGH (ref 6–20)
CALCIUM: 8.4 mg/dL — AB (ref 8.9–10.3)
CO2: 28 mmol/L (ref 22–32)
CREATININE: 4.05 mg/dL — AB (ref 0.44–1.00)
Chloride: 98 mmol/L — ABNORMAL LOW (ref 101–111)
GFR, EST AFRICAN AMERICAN: 11 mL/min — AB (ref >60)
GFR, EST NON AFRICAN AMERICAN: 10 mL/min — AB (ref >60)
Glucose, Bld: 142 mg/dL — ABNORMAL HIGH (ref 65–99)
Phosphorus: 4.8 mg/dL — ABNORMAL HIGH (ref 2.5–4.6)
Potassium: 4.7 mmol/L (ref 3.5–5.1)
SODIUM: 133 mmol/L — AB (ref 135–145)

## 2015-02-12 LAB — CBC
HEMATOCRIT: 27.5 % — AB (ref 36.0–46.0)
HEMOGLOBIN: 8.9 g/dL — AB (ref 12.0–15.0)
MCH: 29.1 pg (ref 26.0–34.0)
MCHC: 32.4 g/dL (ref 30.0–36.0)
MCV: 89.9 fL (ref 78.0–100.0)
Platelets: 205 10*3/uL (ref 150–400)
RBC: 3.06 MIL/uL — ABNORMAL LOW (ref 3.87–5.11)
RDW: 17.4 % — ABNORMAL HIGH (ref 11.5–15.5)
WBC: 8.2 10*3/uL (ref 4.0–10.5)

## 2015-02-12 LAB — PROTIME-INR
INR: 1.14 (ref 0.00–1.49)
PROTHROMBIN TIME: 14.8 s (ref 11.6–15.2)

## 2015-02-12 LAB — MAGNESIUM: Magnesium: 2 mg/dL (ref 1.7–2.4)

## 2015-02-12 LAB — HEPARIN LEVEL (UNFRACTIONATED): HEPARIN UNFRACTIONATED: 0.3 [IU]/mL (ref 0.30–0.70)

## 2015-02-12 MED ORDER — NEPRO/CARBSTEADY PO LIQD
237.0000 mL | Freq: Three times a day (TID) | ORAL | Status: DC
Start: 2015-02-12 — End: 2015-02-19
  Administered 2015-02-12 – 2015-02-17 (×9): 237 mL via ORAL
  Filled 2015-02-12 (×16): qty 237

## 2015-02-12 MED ORDER — SODIUM CHLORIDE 0.9 % IV SOLN: 100.0000 mL | INTRAVENOUS | Status: DC | PRN

## 2015-02-12 MED ORDER — LIDOCAINE-PRILOCAINE 2.5-2.5 % EX CREA: 1.0000 "application " | TOPICAL_CREAM | CUTANEOUS | Status: DC | PRN

## 2015-02-12 MED ORDER — HEPARIN SODIUM (PORCINE) 1000 UNIT/ML DIALYSIS: 1600.0000 [IU] | INTRAMUSCULAR | Status: DC | PRN

## 2015-02-12 MED ORDER — LIDOCAINE HCL (PF) 1 % IJ SOLN: 5.0000 mL | INTRAMUSCULAR | Status: DC | PRN

## 2015-02-12 MED ORDER — ALTEPLASE 2 MG IJ SOLR: 2.0000 mg | Freq: Once | INTRAMUSCULAR | Status: DC | PRN

## 2015-02-12 MED ORDER — HEPARIN SODIUM (PORCINE) 1000 UNIT/ML DIALYSIS: 1000.0000 [IU] | INTRAMUSCULAR | Status: DC | PRN

## 2015-02-12 MED ORDER — FENTANYL CITRATE (PF) 100 MCG/2ML IJ SOLN
12.5000 ug | Freq: Once | INTRAMUSCULAR | Status: AC
  Administered 2015-02-12: 12.5 ug via INTRAVENOUS
  Filled 2015-02-12: qty 2

## 2015-02-12 MED ORDER — HEPARIN SODIUM (PORCINE) 1000 UNIT/ML DIALYSIS: 1600.0000 [IU] | Freq: Once | INTRAMUSCULAR | Status: DC

## 2015-02-12 MED ORDER — ALBUMIN HUMAN 25 % IV SOLN
12.5000 g | Freq: Once | INTRAVENOUS | Status: AC
  Administered 2015-02-12: 12.5 g via INTRAVENOUS
  Filled 2015-02-12: qty 50

## 2015-02-12 MED ORDER — PENTAFLUOROPROP-TETRAFLUOROETH EX AERO: 1.0000 "application " | INHALATION_SPRAY | CUTANEOUS | Status: DC | PRN

## 2015-02-12 NOTE — Progress Notes (Signed)
ANTICOAGULATION CONSULT NOTE - F/U Consult  Pharmacy Consult for Heparin  Indication: atrial fibrillation  No Known Allergies  Patient Measurements: Height: '5\' 5"'$  (165.1 cm) Weight: 107 lb 9.4 oz (48.8 kg) IBW/kg (Calculated) : 57  Vital Signs: Temp: 98.7 F (37.1 C) (12/05 1205) Temp Source: Oral (12/05 1227) BP: 129/54 mmHg (12/05 1227) Pulse Rate: 81 (12/05 1244)  Labs:  Recent Labs  02/09/15 1634  02/10/15 0411 02/10/15 0440  02/11/15 0522 02/11/15 0650 02/11/15 1800 02/12/15 0326 02/12/15 0327  HGB 9.8*  < > 9.5*  --   --  9.1*  --   --  8.9*  --   HCT 31.4*  < > 29.6*  --   --  29.5*  --   --  27.5*  --   PLT 195  --  158  --   --  189  --   --  205  --   APTT  --   --   --  22*  --   --   --   --   --   --   LABPROT 14.4  --  14.0  --   --   --   --   --   --  14.8  INR 1.10  --  1.06  --   --   --   --   --   --  1.14  HEPARINUNFRC  --   --   --   --   < >  --  0.29* 0.39  --  0.30  CREATININE 2.69*  < > 3.06*  --   --  3.13*  --   --  4.05*  --   < > = values in this interval not displayed.  Estimated Creatinine Clearance: 8.5 mL/min (by C-G formula based on Cr of 4.05).  Assessment: 80 YOF on warfarin PTA for PAF- currently holding for possible PM placement. Heparin bridge. Heparin level 0.3 (therapeutic) on 1150 units/hr. No issues with line or bleeding reported per RN. CBC stable.   Goal of Therapy:  Heparin level 0.3-0.7 units/ml INR 2-3 Monitor platelets by anticoagulation protocol: Yes   Plan:  Continue heparin drip 1150 units/hr Monitor for s/sx of bleeding Daily HL, CBC F/U plans to restart warfarin  Eudelia Bunch, Pharm.D. [phone] 02/12/2015 3:02 PM

## 2015-02-12 NOTE — Progress Notes (Signed)
[facility] ROUNDING NOTE   Subjective:   Interval History:  Seen in ICU  She is on dialysis and has no complaints this morning  Objective:  Vital signs in last 24 hours:  Temp:  [98.3 F (36.8 C)-99.7 F (37.6 C)] 98.3 F (36.8 C) (12/05 0806) Pulse Rate:  [51-117] 73 (12/05 0930) Resp:  [12-31] 18 (12/05 0930) BP: (67-128)/(34-85) 90/36 mmHg (12/05 0930) SpO2:  [95 %-100 %] 100 % (12/05 0930) Weight:  [48.8 kg (107 lb 9.4 oz)-49.1 kg (108 lb 3.9 oz)] 48.8 kg (107 lb 9.4 oz) (12/05 0745)  Weight change: -1.9 kg (-4 lb 3 oz) Filed Weights   02/11/15 0500 02/12/15 0323 02/12/15 0745  Weight: 47.7 kg (105 lb 2.6 oz) 49.1 kg (108 lb 3.9 oz) 48.8 kg (107 lb 9.4 oz)    Intake/Output: I/O last 3 completed shifts: In: 665.9 [I.V.:625.9; Other:40] Out: 350 [Chest Tube:350]   Intake/Output this shift:     CVS-  Irregular  2/6 faint systolic murmur  RS-  Bilateral diminished at bases L > R  Otherwise clear   R IJ catheter    ABD-  Soft non distended with bowel sounds  EXT-  1 - 2 +  Edema     Prior mastectomy L>R LFA AVF +bruit, wounds intact  Basic Metabolic Panel:  Recent Labs Lab 02/09/15 1634 02/09/15 2222 02/10/15 0411 02/11/15 0522 02/12/15 0326  NA 139 138 138 136 133*  K 4.1 4.2 4.5 4.5 4.7  CL 99* 100* 101 100* 98*  CO2 31  --  28 27 28   GLUCOSE 97 92 107* 131* 142*  BUN 12 19 16 18  27*  CREATININE 2.69* 2.90* 3.06* 3.13* 4.05*  CALCIUM 8.5*  --  8.4* 8.4* 8.4*  MG  --   --  1.9 2.0 2.0  PHOS  --   --  4.3 4.7* 4.8*    Liver Function Tests:  Recent Labs Lab 02/09/15 1634 02/11/15 0522 02/12/15 0326  AST 27  --   --   ALT 27  --   --   ALKPHOS 143*  --   --   BILITOT 0.5  --   --   PROT 5.5*  --   --   ALBUMIN 2.2* 1.9* 1.9*    Recent Labs Lab 02/10/15 0440  LIPASE 24   No results for input(s): AMMONIA in the last 168 hours.  CBC:  Recent Labs Lab 02/09/15 1634 02/09/15 2222 02/10/15 0411 02/11/15 0522  02/12/15 0326  WBC 7.8  --  9.2 8.7 8.2  NEUTROABS 5.5  --   --   --   --   HGB 9.8* 10.5* 9.5* 9.1* 8.9*  HCT 31.4* 31.0* 29.6* 29.5* 27.5*  MCV 89.0  --  89.4 89.9 89.9  PLT 195  --  158 189 205    Cardiac Enzymes: No results for input(s): CKTOTAL, CKMB, CKMBINDEX, TROPONINI in the last 168 hours.  BNP: Invalid input(s): POCBNP  CBG:  Recent Labs Lab 02/06/15 1150 02/06/15 1153 02/06/15 1155 02/09/15 2152 02/10/15 0219  GLUCAP 66 44* 72 93 98    Microbiology: Results for orders placed or performed during the hospital encounter of 02/09/15  MRSA PCR Screening     Status: None   Collection Time: 02/10/15  2:20 AM  Result Value Ref Range Status   MRSA by PCR NEGATIVE NEGATIVE Final    Comment:        The GeneXpert MRSA Assay (FDA approved for NASAL specimens  only), is one component of a comprehensive MRSA colonization surveillance program. It is not intended to diagnose MRSA infection nor to guide or monitor treatment for MRSA infections.   Blood culture (routine x 2)     Status: None (Preliminary result)   Collection Time: 02/10/15  2:50 AM  Result Value Ref Range Status   Specimen Description BLOOD RIGHT FOREARM  Final   Special Requests BOTTLES DRAWN AEROBIC AND ANAEROBIC 10CC  Final   Culture NO GROWTH 1 DAY  Final   Report Status PENDING  Incomplete  Culture, blood (routine x 2)     Status: None (Preliminary result)   Collection Time: 02/10/15  3:35 AM  Result Value Ref Range Status   Specimen Description BLOOD CENTRAL LINE  Final   Special Requests BOTTLES DRAWN AEROBIC AND ANAEROBIC 10CC  Final   Culture NO GROWTH 1 DAY  Final   Report Status PENDING  Incomplete    Coagulation Studies:  Recent Labs  02/09/15 1634 02/10/15 0411 02/12/15 0327  LABPROT 14.4 14.0 14.8  INR 1.10 1.06 1.14    Urinalysis: No results for input(s): COLORURINE, LABSPEC, PHURINE, GLUCOSEU, HGBUR, BILIRUBINUR, KETONESUR, PROTEINUR, UROBILINOGEN, NITRITE, LEUKOCYTESUR  in the last 72 hours.  Invalid input(s): APPERANCEUR    Imaging: No results found.   Medications:   . heparin 1,150 Units/hr (02/12/15 0337)  . norepinephrine (LEVOPHED) Adult infusion Stopped (02/12/15 0300)   . albumin human  12.5 g Intravenous Once  . anastrozole  1 mg Oral Daily  . antiseptic oral rinse  7 mL Mouth Rinse BID  . feeding supplement (NEPRO CARB STEADY)  237 mL Oral BID BM  . [START ON 02/13/2015] heparin  1,600 Units Dialysis Once in dialysis  . methimazole  20 mg Oral Daily  . midodrine  10 mg Oral TID WC  . multivitamin with minerals  1 tablet Oral QODAY  . sodium chloride  500 mL Intravenous Once  . thiamine  100 mg Oral Daily   sodium chloride, sodium chloride, sodium chloride, acetaminophen, albuterol, alteplase, heparin, HYDROcodone-acetaminophen, lidocaine (PF), lidocaine-prilocaine, ondansetron (ZOFRAN) IV, pentafluoroprop-tetrafluoroeth, senna-docusate  Assessment/ Plan:  End stage renal disease  ::::  Outpatient orders  MWF 4h 51.5kg 3/2.25 bath Heparin 1600 R IJ / L FA AVG    Venofer 50/wk Mircera 150 q2 wks last 11/30  1 Flank pain/ new L pleural effusion - per primary 2 Bradycardia junct, symptomatic improved off dig, may need PPM 3 Recurrent pleural eff, s/p pleurex R side 4 Hypotens on midodrine 5 Edema will continue to challenge dry weight  May use albumin to support 6 ESRD hd via cath, has new AVF left arm  7 Chronic diast HF 8 BRCA bilat , in remission 9 Anemia Hb 8.9   Venofer/micera   150 q 2 weeks  10 MBD CA/ P ok 11 Massive goiter/ intrathoracic extension - not surg candidate 12 Chron afib on coumadin 13 Pulm HTN f/b Dr Einar Gip 14 Hx MGUS  LOS: 2 Hayes Czaja W @TODAY @9 :46 AM

## 2015-02-12 NOTE — Procedures (Signed)
I have seen and examined this patient and agree with the plan of care. Patient was seen on dialysis and appears to be tolerating BP 107/60    Brae Schaafsma W 02/12/2015, 9:44 AM

## 2015-02-12 NOTE — Progress Notes (Signed)
Initial Nutrition Assessment  DOCUMENTATION CODES:   Severe malnutrition in context of chronic illness, Underweight  INTERVENTION:    Nepro Shake po TID, each supplement provides 425 kcal and 19 grams protein  NUTRITION DIAGNOSIS:   Malnutrition related to chronic illness as evidenced by severe depletion of muscle mass, severe depletion of body fat.  GOAL:   Patient will meet greater than or equal to 90% of their needs  MONITOR:   PO intake, Supplement acceptance, Labs, Weight trends, I & O's  REASON FOR ASSESSMENT:   Malnutrition Screening Tool    ASSESSMENT:   Patient admtted on 12/2 with symptomatic bradycardia with hypotension.   Labs reviewed: sodium low, phosphorus elevated  Patient on HD during RD visit. She reports that she has been eating okay and drinking the Nepro shake supplements. She has been losing weight progressively for the past 2-3 years and has lost 15% in the past month. Nutrition-Focused physical exam completed. Findings are severe fat depletion, severe muscle depletion, and no edema. Patient with severe PCM and is underweight with BMI=17.9.  Diet Order:  Diet renal with fluid restriction Room service appropriate?: Yes; Fluid consistency:: Thin  Skin:  Wound (see comment) (stage 2 pressure injury to coccyx)  Last BM:  PTA  Height:   Ht Readings from Last 1 Encounters:  02/09/15 '5\' 5"'$  (1.651 m)    Weight:   Wt Readings from Last 1 Encounters:  02/12/15 107 lb 9.4 oz (48.8 kg)    Ideal Body Weight:  56.8 kg  BMI:  Body mass index is 17.9 kg/(m^2).  Estimated Nutritional Needs:   Kcal:  1550-1750  Protein:  70-80 gm  Fluid:  1.2 L  EDUCATION NEEDS:   No education needs identified at this time  Molli Barrows, [place], [place], [place] Pager [phone] After Hours Pager [phone]

## 2015-02-12 NOTE — Progress Notes (Signed)
Chaplain's Note:  COnsult received re: possible need to recreate AD.  I spoke with patient who was clear that she had Living Will and HCPOA and that husband would be bringing them from home.  She did not wish to create new documents.   Alison Gross spoke of her concern for her health and the pain she feels when breathing and described it as the kind of pain you feel when you have severe gas. She asked me to pray for her and expressed  the hope that she can have her health " restored."    Will be glad to assist further if needed.   Wells Guiles Chaplain `

## 2015-02-12 NOTE — Progress Notes (Signed)
PULMONARY / CRITICAL CARE MEDICINE   Name: Alison Gross MRN: [id number] DOB: 07-15-1932    ADMISSION DATE:  02/09/2015  CHIEF COMPLAINT:  Symptomatic bradycardia with hypotension  SUBJECTIVE: . patient reports mild dyspnea preceding initiation of hemodialysis. She began to experience left-sided chest discomfort similar to her prior episode after initiation of dialysis with some pleuritic component. No significant cough.  REVIEW OF SYSTEMS:  No fever or chills. She did have some nausea this morning but denies any abdominal pain.  VITAL SIGNS: Temp:  [98.3 F (36.8 C)-99.7 F (37.6 C)] 98.7 F (37.1 C) (12/05 1205) Pulse Rate:  [51-85] 81 (12/05 1244) Resp:  [13-31] 18 (12/05 1244) BP: (67-153)/(34-67) 129/54 mmHg (12/05 1227) SpO2:  [99 %-100 %] 99 % (12/05 1244) Weight:  [107 lb 9.4 oz (48.8 kg)-108 lb 3.9 oz (49.1 kg)] 107 lb 9.4 oz (48.8 kg) (12/05 0745) HEMODYNAMICS:   VENTILATOR SETTINGS:   INTAKE / OUTPUT:  Intake/Output Summary (Last 24 hours) at 02/12/15 1540 Last data filed at 02/12/15 1227  Gross per 24 hour  Intake 346.25 ml  Output   2832 ml  Net -2485.75 ml    PHYSICAL EXAMINATION: General:  Awake. Alert.  Examined on hemodialysis  Integument:  Warm & dry. No rash on exposed skin.  HEENT:  Moist mucus membranes. No oral ulcers. PERRL. Cardiovascular:  Regular rate. No appreciable JVD.  Pulmonary:  Right tunnel pleural catheter in place.  Left internal jugular central venous catheter in place.Symmetric chest wall expansion with normal work of breathing. Musculoskeletal: Normal muscle tone. No appreciated chest wall deformity. Right sided indwelling pleural catheter in place. Tender to palpation over left hemithorax.   LABS:  CBC  Recent Labs Lab 02/10/15 0411 02/11/15 0522 02/12/15 0326  WBC 9.2 8.7 8.2  HGB 9.5* 9.1* 8.9*  HCT 29.6* 29.5* 27.5*  PLT 158 189 205   Coag's  Recent Labs Lab 02/06/15 0906 02/09/15 1634 02/10/15 0411  02/10/15 0440 02/12/15 0327  APTT 168*  --   --  22*  --   INR 1.18 1.10 1.06  --  1.14   BMET  Recent Labs Lab 02/10/15 0411 02/11/15 0522 02/12/15 0326  NA 138 136 133*  K 4.5 4.5 4.7  CL 101 100* 98*  CO2 '28 27 28  '$ BUN 16 18 27*  CREATININE 3.06* 3.13* 4.05*  GLUCOSE 107* 131* 142*   Electrolytes  Recent Labs Lab 02/10/15 0411 02/11/15 0522 02/12/15 0326  CALCIUM 8.4* 8.4* 8.4*  MG 1.9 2.0 2.0  PHOS 4.3 4.7* 4.8*   Sepsis Markers  Recent Labs Lab 02/10/15 0423  LATICACIDVEN 1.4   ABG No results for input(s): PHART, PCO2ART, PO2ART in the last 168 hours. Liver Enzymes  Recent Labs Lab 02/09/15 1634 02/11/15 0522 02/12/15 0326  AST 27  --   --   ALT 27  --   --   ALKPHOS 143*  --   --   BILITOT 0.5  --   --   ALBUMIN 2.2* 1.9* 1.9*   Cardiac Enzymes No results for input(s): TROPONINI, PROBNP in the last 168 hours. Glucose  Recent Labs Lab 02/06/15 1123 02/06/15 1150 02/06/15 1153 02/06/15 1155 02/09/15 2152 02/10/15 0219  GLUCAP 66 66 44* 72 93 98    Imaging No results found.   ASSESSMENT / PLAN:  81yo AAF with ESRD, left sided pleuritic chest pain likely 2/2 pleural effusion and symptomatic bradycardia of unclear etiology.  Patient previously weaned off of vasopressor support. Etiology for her  dyspnea is perplexing as she currently has a normal sinus rhythm on telemetry. Her chest wall pain could be secondary to her underlying left pleural effusion but the pleuritic nature to it is also closely. Certainly the patient's bradycardia seems to have improved with holding digoxin & further hemodialysis. Patient may need a left thoracentesis which has never been performed.  1. Symptomatic bradycardia with hypotension: Bradycardia resolved requiring vasopressor infusion during hemodialysis.. Continuing midodrine by mouth 3 times a day. Continuing to wean vasopressor support. Continuing to hold digoxin. Patient may need leadless pacemaker.  Cardiology following. Monitoring patient on telemetry. 2. Bilateral pleural effusions: Status post right Pleurx catheter placement. Checking CT scan of the chest without contrast after dialysis. Consider left thoracentesis depending upon this result.  3. Chest pain/pleurisy: Recurred during dialysis today. Symptomatic treatment with fentanyl 12.5 g IV 1. Checking CT scan of the chest without contrast postdialysis.  4. Paroxysmal atrial fibrillation: Holding Coumadin. Heparin drip per pharmacy protocol. Continuing to monitor on telemetry.  5. End-stage renal disease: Hemodialysis per Nephrology recommendations. Continuing to monitor electrolytes daily. 6. History of GERD with gastric ulcer: Monitor hemoglobin daily with CBC. 7. Hyperthyroidism: Continuing methimazole by mouth daily. 8. History of breast cancer: Continuing Arimidex. 9. History of diabetes mellitus/hypoglycemia: Patient had hypoglycemia to 44 on admission. Now stable. 10. Prophylaxis: Systemic anticoagulation with heparin drip per pharmacy protocol. 11. Diet: Renal diet with fluid restriction.   I have spent a total of 37 minutes of critical care time today caring for the patient & reviewing the patient's electronic medical record as well as discussing the plan of care with nephrology.  Sonia Baller Ashok Cordia, M.D. [facility] Pulmonary & Critical Care Pager:  [phone] After 3pm or if no response, call [phone]  02/12/2015, 3:40 PM

## 2015-02-12 NOTE — Patient Outreach (Signed)
Member admitted to hospital last chest pain during hemodialysis and hypotension.  Hospital liaison notified of admission.  Will continue with care once discharged.  Valente David, BSN, [facility]  [facility] Care Manager [phone]

## 2015-02-12 NOTE — Consult Note (Signed)
   [facility] Inpatient Consult   02/12/2015  Alison Gross Mar 05, 1933 [id number]   Patient is active with [facility] services. Made aware by [facility] RNCM of patient's admission. Please see chart review tab then notes for further [facility] correspondence. Questioning whether patient/family could benefit from a palliative/goals of care discussion during this admission. [place] indicates patient has gotten progressively weaker and the like. Left voicemail for inpatient RNCM to make aware that patient is active with [facility]. Will continue to follow.  Marthenia Rolling, MSN-Ed, RN,BSN [facility] Liaison [phone]

## 2015-02-13 ENCOUNTER — Ambulatory Visit: Payer: Commercial Managed Care - HMO

## 2015-02-13 ENCOUNTER — Ambulatory Visit: Payer: Commercial Managed Care - HMO | Admitting: Oncology

## 2015-02-13 ENCOUNTER — Other Ambulatory Visit: Payer: Commercial Managed Care - HMO

## 2015-02-13 LAB — RENAL FUNCTION PANEL
ALBUMIN: 2 g/dL — AB (ref 3.5–5.0)
Anion gap: 5 (ref 5–15)
BUN: 16 mg/dL (ref 6–20)
CO2: 29 mmol/L (ref 22–32)
CREATININE: 2.72 mg/dL — AB (ref 0.44–1.00)
Calcium: 8.3 mg/dL — ABNORMAL LOW (ref 8.9–10.3)
Chloride: 99 mmol/L — ABNORMAL LOW (ref 101–111)
GFR calc Af Amer: 18 mL/min — ABNORMAL LOW (ref >60)
GFR, EST NON AFRICAN AMERICAN: 15 mL/min — AB (ref >60)
GLUCOSE: 133 mg/dL — AB (ref 65–99)
PHOSPHORUS: 3.9 mg/dL (ref 2.5–4.6)
Potassium: 3.7 mmol/L (ref 3.5–5.1)
Sodium: 133 mmol/L — ABNORMAL LOW (ref 135–145)

## 2015-02-13 LAB — HEPARIN LEVEL (UNFRACTIONATED)
HEPARIN UNFRACTIONATED: 0.26 [IU]/mL — AB (ref 0.30–0.70)
HEPARIN UNFRACTIONATED: 0.42 [IU]/mL (ref 0.30–0.70)

## 2015-02-13 LAB — CBC
HEMATOCRIT: 27.4 % — AB (ref 36.0–46.0)
HEMOGLOBIN: 8.5 g/dL — AB (ref 12.0–15.0)
MCH: 27.7 pg (ref 26.0–34.0)
MCHC: 31 g/dL (ref 30.0–36.0)
MCV: 89.3 fL (ref 78.0–100.0)
Platelets: 168 10*3/uL (ref 150–400)
RBC: 3.07 MIL/uL — ABNORMAL LOW (ref 3.87–5.11)
RDW: 17.3 % — AB (ref 11.5–15.5)
WBC: 6 10*3/uL (ref 4.0–10.5)

## 2015-02-13 LAB — MAGNESIUM: Magnesium: 2 mg/dL (ref 1.7–2.4)

## 2015-02-13 MED ORDER — SODIUM CHLORIDE 0.9 % IV BOLUS (SEPSIS)
250.0000 mL | Freq: Once | INTRAVENOUS | Status: AC
  Administered 2015-02-13: 250 mL via INTRAVENOUS

## 2015-02-13 NOTE — Progress Notes (Signed)
ANTICOAGULATION CONSULT NOTE - Follow Up Consult  Pharmacy Consult for heparin Indication: atrial fibrillation  Labs:  Recent Labs  02/10/15 0440  02/11/15 0522  02/11/15 1800 02/12/15 0326 02/12/15 0327 02/13/15 0410  HGB  --   < > 9.1*  --   --  8.9*  --  8.5*  HCT  --   --  29.5*  --   --  27.5*  --  27.4*  PLT  --   --  189  --   --  205  --  168  APTT 22*  --   --   --   --   --   --   --   LABPROT  --   --   --   --   --   --  14.8  --   INR  --   --   --   --   --   --  1.14  --   HEPARINUNFRC  --   < >  --   < > 0.39  --  0.30 0.26*  CREATININE  --   --  3.13*  --   --  4.05*  --   --   < > = values in this interval not displayed.   Assessment: 81yo female now subtherapeutic on heparin after two levels at goal though had been trending down to low end of goal.  Goal of Therapy:  Heparin level 0.3-0.7 units/ml   Plan:  Will increase heparin gtt slightly to 1250 units/hr and check level in [place], PharmD, BCPS  02/13/2015,4:38 AM

## 2015-02-13 NOTE — Care Management Important Message (Signed)
Important Message  Patient Details  Name: Alison Gross MRN: [id number] Date of Birth: 1932-08-24   Medicare Important Message Given:  Yes    [name] May 02/13/2015, 11:24 AM

## 2015-02-13 NOTE — Progress Notes (Signed)
ANTICOAGULATION CONSULT NOTE - Follow Up Consult  Pharmacy Consult for heparin Indication: atrial fibrillation  Labs:  Recent Labs  02/11/15 0522  02/12/15 0326 02/12/15 0327 02/13/15 0410 02/13/15 1520  HGB 9.1*  --  8.9*  --  8.5*  --   HCT 29.5*  --  27.5*  --  27.4*  --   PLT 189  --  205  --  168  --   LABPROT  --   --   --  14.8  --   --   INR  --   --   --  1.14  --   --   HEPARINUNFRC  --   < >  --  0.30 0.26* 0.42  CREATININE 3.13*  --  4.05*  --  2.72*  --   < > = values in this interval not displayed.   Assessment: 81yo female now therapeutic on heparin at 1250 units/hr. H/H slight trend down. Plts ok.   Goal of Therapy:  Heparin level 0.3-0.7 units/ml   Plan:  Continue heparin at 1250 units/hr.  Recheck with AM labs.   Sloan Leiter, PharmD, BCPS Clinical Pharmacist [phone] 02/13/2015,4:01 PM

## 2015-02-13 NOTE — Progress Notes (Signed)
[facility] ROUNDING NOTE   Subjective:   Interval History: Comfortable this morning with no complaints  Oxygen via nasal canula   Objective:  Vital signs in last 24 hours:  Temp:  [97.7 F (36.5 C)-99.4 F (37.4 C)] 98.5 F (36.9 C) (12/06 0827) Pulse Rate:  [64-85] 66 (12/06 0700) Resp:  [11-30] 15 (12/06 0700) BP: (72-153)/(34-93) 117/51 mmHg (12/06 0700) SpO2:  [99 %-100 %] 100 % (12/06 0700) Weight:  [46.4 kg (102 lb 4.7 oz)-46.7 kg (102 lb 15.3 oz)] 46.7 kg (102 lb 15.3 oz) (12/06 0345)  Weight change: -0.3 kg (-10.6 oz) Filed Weights   02/12/15 0745 02/12/15 1227 02/13/15 0345  Weight: 48.8 kg (107 lb 9.4 oz) 46.4 kg (102 lb 4.7 oz) 46.7 kg (102 lb 15.3 oz)    Intake/Output: I/O last 3 completed shifts: In: 1119 [P.O.:240; I.V.:794; Other:60; IV Piggyback:25] Out: 2932 [Drains:450; Other:2482]   Intake/Output this shift:     CVS- Irregular 2/6 faint systolic murmur  RS- Bilateral diminished at bases L > R Otherwise clear R IJ catheter  ABD- Soft non distended with bowel sounds  EXT-  noedema  Prior mastectomy L>R LFA AVF +bruit, wounds intact   Basic Metabolic Panel:  Recent Labs Lab 02/09/15 1634 02/09/15 2222 02/10/15 0411 02/11/15 0522 02/12/15 0326 02/13/15 0410  NA 139 138 138 136 133* 133*  K 4.1 4.2 4.5 4.5 4.7 3.7  CL 99* 100* 101 100* 98* 99*  CO2 31  --  28 27 28 29   GLUCOSE 97 92 107* 131* 142* 133*  BUN 12 19 16 18  27* 16  CREATININE 2.69* 2.90* 3.06* 3.13* 4.05* 2.72*  CALCIUM 8.5*  --  8.4* 8.4* 8.4* 8.3*  MG  --   --  1.9 2.0 2.0 2.0  PHOS  --   --  4.3 4.7* 4.8* 3.9    Liver Function Tests:  Recent Labs Lab 02/09/15 1634 02/11/15 0522 02/12/15 0326 02/13/15 0410  AST 27  --   --   --   ALT 27  --   --   --   ALKPHOS 143*  --   --   --   BILITOT 0.5  --   --   --   PROT 5.5*  --   --   --   ALBUMIN 2.2* 1.9* 1.9* 2.0*    Recent Labs Lab 02/10/15 0440  LIPASE 24   No results for  input(s): AMMONIA in the last 168 hours.  CBC:  Recent Labs Lab 02/09/15 1634 02/09/15 2222 02/10/15 0411 02/11/15 0522 02/12/15 0326 02/13/15 0410  WBC 7.8  --  9.2 8.7 8.2 6.0  NEUTROABS 5.5  --   --   --   --   --   HGB 9.8* 10.5* 9.5* 9.1* 8.9* 8.5*  HCT 31.4* 31.0* 29.6* 29.5* 27.5* 27.4*  MCV 89.0  --  89.4 89.9 89.9 89.3  PLT 195  --  158 189 205 168    Cardiac Enzymes: No results for input(s): CKTOTAL, CKMB, CKMBINDEX, TROPONINI in the last 168 hours.  BNP: Invalid input(s): POCBNP  CBG:  Recent Labs Lab 02/06/15 1150 02/06/15 1153 02/06/15 1155 02/09/15 2152 02/10/15 0219  GLUCAP 66 44* 72 93 98    Microbiology: Results for orders placed or performed during the hospital encounter of 02/09/15  MRSA PCR Screening     Status: None   Collection Time: 02/10/15  2:20 AM  Result Value Ref Range Status   MRSA by PCR NEGATIVE  NEGATIVE Final    Comment:        The GeneXpert MRSA Assay (FDA approved for NASAL specimens only), is one component of a comprehensive MRSA colonization surveillance program. It is not intended to diagnose MRSA infection nor to guide or monitor treatment for MRSA infections.   Blood culture (routine x 2)     Status: None (Preliminary result)   Collection Time: 02/10/15  2:50 AM  Result Value Ref Range Status   Specimen Description BLOOD RIGHT FOREARM  Final   Special Requests BOTTLES DRAWN AEROBIC AND ANAEROBIC 10CC  Final   Culture NO GROWTH 2 DAYS  Final   Report Status PENDING  Incomplete  Culture, blood (routine x 2)     Status: None (Preliminary result)   Collection Time: 02/10/15  3:35 AM  Result Value Ref Range Status   Specimen Description BLOOD CENTRAL LINE  Final   Special Requests BOTTLES DRAWN AEROBIC AND ANAEROBIC 10CC  Final   Culture NO GROWTH 1 DAY  Final   Report Status PENDING  Incomplete    Coagulation Studies:  Recent Labs  02/12/15 0327  LABPROT 14.8  INR 1.14    Urinalysis: No results for  input(s): COLORURINE, LABSPEC, PHURINE, GLUCOSEU, HGBUR, BILIRUBINUR, KETONESUR, PROTEINUR, UROBILINOGEN, NITRITE, LEUKOCYTESUR in the last 72 hours.  Invalid input(s): APPERANCEUR    Imaging: Ct Chest Wo Contrast  02/12/2015  CLINICAL DATA:  Left-sided chest pain and dyspnea preceding initiation of hemodialysis. EXAM: CT CHEST WITHOUT CONTRAST TECHNIQUE: Multidetector CT imaging of the chest was performed following the standard protocol without IV contrast. COMPARISON:  02/09/2015 FINDINGS: Mediastinum/Lymph Nodes: 2 catheters from right internal jugular approach are seen terminating in the superior vena cava. A catheter from left internal jugular approach is also seen terminating in the distal superior vena cava. No pathologically enlarged lymph nodes identified on this un-enhanced exam. Again seen is a large heterogeneous anterior mediastinal mass extending on the right to the level of the carina. On previous CT exams this was determined to represent an extension of the thyroid gland. On this exam the thyroid gland is not visualized, as it is collimated off the image. The trachea, and main bronchi are patent. The heart is enlarged. There is no evidence of pericardial effusion. Atherosclerotic disease of the aorta is seen. Lungs/Pleura: There are bilateral pleural effusions, moderate in size, larger one on the left. Compressive atelectasis is seen in bilateral lung bases. Segmental atelectasis of the left lower lobe is noted. Pericentral and centrilobular emphysema of the lungs is seen. Upper abdomen: No acute findings. Musculoskeletal: No chest wall mass or suspicious bone lesions identified. Multilevel osteoarthritic changes of the thoracic spine are seen. IMPRESSION: Large heterogeneous anterior mediastinal mass, previously determined to represent a thyroid mass. Thyroid cancer cannot be excluded with this appearance. Bilateral moderate in size pleural effusions, the larger of which is on the left, with  secondary compressive atelectasis and collapse of significant portion of the left lower lobe. Lung emphysema. Electronically Signed   By: Fidela Salisbury M.D.   On: 02/12/2015 17:07     Medications:   . heparin 1,250 Units/hr (02/13/15 0444)  . norepinephrine (LEVOPHED) Adult infusion 2.5 mcg/min (02/13/15 0338)   . anastrozole  1 mg Oral Daily  . antiseptic oral rinse  7 mL Mouth Rinse BID  . feeding supplement (NEPRO CARB STEADY)  237 mL Oral TID BM  . methimazole  20 mg Oral Daily  . midodrine  10 mg Oral TID WC  . multivitamin  with minerals  1 tablet Oral QODAY  . sodium chloride  500 mL Intravenous Once  . thiamine  100 mg Oral Daily   sodium chloride, acetaminophen, albuterol, heparin, HYDROcodone-acetaminophen, ondansetron (ZOFRAN) IV, senna-docusate  Assessment/ Plan:  End stage renal disease :::: Outpatient orders MWF 4h 51.5kg 3/2.25 bath Heparin 1600 R IJ / L FA AVG   Venofer 50/wk Mircera 150 q2 wks last 11/30  1 Flank pain/ new L pleural effusion - considering throacentesis per CCM 2 Bradycardia junct, symptomatic improved off dig, may need PPM  Continuing telemetry for now 3 Recurrent pleural eff, s/p pleurex R side 4 Hypotens on midodrine 32m TID 5 Edema will continue to challenge dry weight May use albumin to support 6 ESRD hd via cath, has new AVF left arm HD 12/7 7 Chronic diast HF 8 BRCA bilat , in remission 9 Anemia Hb 8.9 Venofer/micera 150 q 2 weeks  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

## 2015-02-13 NOTE — Progress Notes (Signed)
PULMONARY / CRITICAL CARE MEDICINE   Name: Alison Gross MRN: [id number] DOB: 10-20-1932    ADMISSION DATE:  02/09/2015  CHIEF COMPLAINT:  Symptomatic bradycardia with hypotension  SUBJECTIVE: Patient reports chest discomfort she spent yesterday was migratory and as hemodialysis continued seem to be more related to her chest wall. Nausea & dyspnea improved with reinitiating vasopressor support. Denies any chest pain or pressure currently. Denies any dyspnea currently.  REVIEW OF SYSTEMS:  No subjective fever or chills. No abdominal pain or diarrhea.  VITAL SIGNS: Temp:  [97.7 F (36.5 C)-99.4 F (37.4 C)] 98.5 F (36.9 C) (12/06 1137) Pulse Rate:  [64-85] 66 (12/06 0700) Resp:  [11-30] 15 (12/06 0700) BP: (72-135)/(37-93) 117/51 mmHg (12/06 0700) SpO2:  [99 %-100 %] 100 % (12/06 0700) Weight:  [102 lb 15.3 oz (46.7 kg)] 102 lb 15.3 oz (46.7 kg) (12/06 0345) HEMODYNAMICS:   VENTILATOR SETTINGS:   INTAKE / OUTPUT:  Intake/Output Summary (Last 24 hours) at 02/13/15 1416 Last data filed at 02/13/15 0700  Gross per 24 hour  Intake 643.86 ml  Output    450 ml  Net 193.86 ml    PHYSICAL EXAMINATION: General:  Awake. Alert.  Sitting comfortably watching TV in bed. Integument:  Warm & dry. No rash on exposed skin.  HEENT:  Moist mucus membranes. No scleral injection. Cardiovascular:  Regular rate. Sinus rhythm on telemetry. No appreciable JVD.  Pulmonary:  Symmetric chest wall expansion with normal work of breathing. Speaking in complete sentences. Right-sided tunneled pleural catheter in place. Musculoskeletal: Normal muscle tone. No appreciated chest wall deformity. No tenderness to palpation over left hemithorax.   LABS:  CBC  Recent Labs Lab 02/11/15 0522 02/12/15 0326 02/13/15 0410  WBC 8.7 8.2 6.0  HGB 9.1* 8.9* 8.5*  HCT 29.5* 27.5* 27.4*  PLT 189 205 168   Coag's  Recent Labs Lab 02/09/15 1634 02/10/15 0411 02/10/15 0440 02/12/15 0327  APTT  --   --   22*  --   INR 1.10 1.06  --  1.14   BMET  Recent Labs Lab 02/11/15 0522 02/12/15 0326 02/13/15 0410  NA 136 133* 133*  K 4.5 4.7 3.7  CL 100* 98* 99*  CO2 '27 28 29  '$ BUN 18 27* 16  CREATININE 3.13* 4.05* 2.72*  GLUCOSE 131* 142* 133*   Electrolytes  Recent Labs Lab 02/11/15 0522 02/12/15 0326 02/13/15 0410  CALCIUM 8.4* 8.4* 8.3*  MG 2.0 2.0 2.0  PHOS 4.7* 4.8* 3.9   Sepsis Markers  Recent Labs Lab 02/10/15 0423  LATICACIDVEN 1.4   ABG No results for input(s): PHART, PCO2ART, PO2ART in the last 168 hours. Liver Enzymes  Recent Labs Lab 02/09/15 1634 02/11/15 0522 02/12/15 0326 02/13/15 0410  AST 27  --   --   --   ALT 27  --   --   --   ALKPHOS 143*  --   --   --   BILITOT 0.5  --   --   --   ALBUMIN 2.2* 1.9* 1.9* 2.0*   Cardiac Enzymes No results for input(s): TROPONINI, PROBNP in the last 168 hours. Glucose  Recent Labs Lab 02/09/15 2152 02/10/15 0219  GLUCAP 93 98    Imaging Ct Chest Wo Contrast  02/12/2015  CLINICAL DATA:  Left-sided chest pain and dyspnea preceding initiation of hemodialysis. EXAM: CT CHEST WITHOUT CONTRAST TECHNIQUE: Multidetector CT imaging of the chest was performed following the standard protocol without IV contrast. COMPARISON:  02/09/2015 FINDINGS: Mediastinum/Lymph Nodes:  2 catheters from right internal jugular approach are seen terminating in the superior vena cava. A catheter from left internal jugular approach is also seen terminating in the distal superior vena cava. No pathologically enlarged lymph nodes identified on this un-enhanced exam. Again seen is a large heterogeneous anterior mediastinal mass extending on the right to the level of the carina. On previous CT exams this was determined to represent an extension of the thyroid gland. On this exam the thyroid gland is not visualized, as it is collimated off the image. The trachea, and main bronchi are patent. The heart is enlarged. There is no evidence of  pericardial effusion. Atherosclerotic disease of the aorta is seen. Lungs/Pleura: There are bilateral pleural effusions, moderate in size, larger one on the left. Compressive atelectasis is seen in bilateral lung bases. Segmental atelectasis of the left lower lobe is noted. Pericentral and centrilobular emphysema of the lungs is seen. Upper abdomen: No acute findings. Musculoskeletal: No chest wall mass or suspicious bone lesions identified. Multilevel osteoarthritic changes of the thoracic spine are seen. IMPRESSION: Large heterogeneous anterior mediastinal mass, previously determined to represent a thyroid mass. Thyroid cancer cannot be excluded with this appearance. Bilateral moderate in size pleural effusions, the larger of which is on the left, with secondary compressive atelectasis and collapse of significant portion of the left lower lobe. Lung emphysema. Electronically Signed   By: Fidela Salisbury M.D.   On: 02/12/2015 17:07     ASSESSMENT / PLAN:  81yo AAF with ESRD admitted with left sided pleuritic chest pain of unclear etiology and symptomatic bradycardia likely secondary to digoxin. Recurrence of chest wall discomfort during HD yesterday same as prior per patient's report and likely due to muscle cramping.  Reviewed her Chest CT from 12/5 myself but no significant change in mediastinal mass or left pleural effusion. Bradycardia has resolved with HD & holding Digoxin.  Patient previously weaned off of vasopressor support but now requiring low level after ultrafiltration. Holding on left thoracentesis given resolution of chest discomfort & potential for complication. She will remain in the ICU while still requiring vasopressors.  1. Symptomatic bradycardia: Bradycardia resolved. Continuing to hold digoxin. Monitoring patient on telemetry. 2. Hypotension:  Multifactorial. NS bolus this morning. Continuing midodrine by mouth 3 times a day. Continuing to wean vasopressor support.  3. Bilateral  pleural effusions: Status post right Pleurx catheter placement w/ talc pleurodesis through indwelling catheter. Holding left thoracentesis given stability of pleural effusion & symptoms. 4. Chest pain/pleurisy: Recurred during dialysis today & likely musculoskeletal in origin. Symptomatic treatment with fentanyl 12.5 g IV 1.   5. Paroxysmal atrial fibrillation: Holding Coumadin. Heparin drip per pharmacy protocol. Continuing to monitor on telemetry.  6. End-stage renal disease: Hemodialysis per Nephrology recommendations. Continuing to monitor electrolytes daily. 7. History of GERD with gastric ulcer: Monitor hemoglobin daily with CBC. 8. Hyperthyroidism: Continuing Methimazole by mouth daily. 9. History of breast cancer: Continuing Arimidex. 10. History of diabetes mellitus/hypoglycemia: Patient had hypoglycemia to 44 on admission. Now stable. 11. Prophylaxis: Systemic anticoagulation with heparin drip per pharmacy protocol. 12. Diet: Renal diet with fluid restriction.   I have spent a total of 32 minutes of critical care time today caring for the patient & reviewing the patient's electronic medical record.  Sonia Baller Ashok Cordia, M.D. [facility]monary [facility] Critical [facility] Pager:  [phone] After 3pm or if no response, call [phone]  02/13/2015, 2:16 PM

## 2015-02-14 DIAGNOSIS — R7989 Other specified abnormal findings of blood chemistry: Secondary | ICD-10-CM

## 2015-02-14 DIAGNOSIS — R29898 Other symptoms and signs involving the musculoskeletal system: Secondary | ICD-10-CM

## 2015-02-14 LAB — RENAL FUNCTION PANEL
ALBUMIN: 2 g/dL — AB (ref 3.5–5.0)
Anion gap: 6 (ref 5–15)
BUN: 24 mg/dL — AB (ref 6–20)
CALCIUM: 8.4 mg/dL — AB (ref 8.9–10.3)
CO2: 28 mmol/L (ref 22–32)
CREATININE: 3.73 mg/dL — AB (ref 0.44–1.00)
Chloride: 98 mmol/L — ABNORMAL LOW (ref 101–111)
GFR calc Af Amer: 12 mL/min — ABNORMAL LOW (ref >60)
GFR, EST NON AFRICAN AMERICAN: 11 mL/min — AB (ref >60)
Glucose, Bld: 163 mg/dL — ABNORMAL HIGH (ref 65–99)
PHOSPHORUS: 4.1 mg/dL (ref 2.5–4.6)
POTASSIUM: 4 mmol/L (ref 3.5–5.1)
SODIUM: 132 mmol/L — AB (ref 135–145)

## 2015-02-14 LAB — MAGNESIUM: MAGNESIUM: 2.1 mg/dL (ref 1.7–2.4)

## 2015-02-14 LAB — CBC
HEMATOCRIT: 28.2 % — AB (ref 36.0–46.0)
HEMOGLOBIN: 8.8 g/dL — AB (ref 12.0–15.0)
MCH: 27.8 pg (ref 26.0–34.0)
MCHC: 31.2 g/dL (ref 30.0–36.0)
MCV: 89.2 fL (ref 78.0–100.0)
Platelets: 199 10*3/uL (ref 150–400)
RBC: 3.16 MIL/uL — ABNORMAL LOW (ref 3.87–5.11)
RDW: 17 % — ABNORMAL HIGH (ref 11.5–15.5)
WBC: 6.7 10*3/uL (ref 4.0–10.5)

## 2015-02-14 LAB — HEPARIN LEVEL (UNFRACTIONATED): HEPARIN UNFRACTIONATED: 0.39 [IU]/mL (ref 0.30–0.70)

## 2015-02-14 NOTE — Care Management Note (Signed)
Case Management Note  Patient Details  Name: Alison Gross MRN: [id number] Date of Birth: Aug 07, 1932  Subjective/Objective:         [facility] called to inform us that patient is a patient of them at home but has consistently been at a decline.  May need higher level of care after this admission .  At this point remains on low dose pressors.            Action/Plan:   Expected Discharge Date:  02/15/15               Expected Discharge Plan:  Home/Self Care  In-House Referral:     Discharge planning Services  CM Consult  Post Acute Care Choice:  Resumption of Svcs/PTA Provider Choice offered to:     DME Arranged:    DME Agency:     HH Arranged:    HH Agency:     Status of Service:  In process, will continue to follow  Medicare Important Message Given:  Yes Date Medicare IM Given:    Medicare IM give by:    Date Additional Medicare IM Given:    Additional Medicare Important Message give by:     If discussed at [place] of Stay Meetings, dates discussed:    Additional Comments:  Vergie Living, RN 02/14/2015, 8:41 AM

## 2015-02-14 NOTE — Progress Notes (Signed)
ANTICOAGULATION CONSULT NOTE - F/U Consult  Pharmacy Consult for Heparin  Indication: atrial fibrillation  No Known Allergies  Patient Measurements: Height: '5\' 5"'$  (165.1 cm) Weight: 106 lb 11.2 oz (48.4 kg) IBW/kg (Calculated) : 57  Vital Signs: Temp: 97.8 F (36.6 C) (12/07 0807) Temp Source: Oral (12/07 0807) BP: 84/57 mmHg (12/07 0900) Pulse Rate: 84 (12/07 0900)  Labs:  Recent Labs  02/12/15 0326 02/12/15 0327 02/13/15 0410 02/13/15 1520 02/14/15 0430 02/14/15 0510  HGB 8.9*  --  8.5*  --  8.8*  --   HCT 27.5*  --  27.4*  --  28.2*  --   PLT 205  --  168  --  199  --   LABPROT  --  14.8  --   --   --   --   INR  --  1.14  --   --   --   --   HEPARINUNFRC  --  0.30 0.26* 0.42  --  0.39  CREATININE 4.05*  --  2.72*  --  3.73*  --     Estimated Creatinine Clearance: 9.2 mL/min (by C-G formula based on Cr of 3.73).  Assessment: 84 YOF on warfarin PTA for PAF- currently holding for possible PM placement. Heparin bridge. Heparin level 0.39 (therapeutic) on 1250 units/hr. No issues with line or bleeding reported per RN. CBC stable. Holding off thoracentesis for now.   Goal of Therapy:  Heparin level 0.3-0.7 units/ml INR 2-3 Monitor platelets by anticoagulation protocol: Yes   Plan:  Continue heparin drip 1250 units/hr Monitor for s/sx of bleeding Daily HL, CBC F/U plans to restart warfarin once no longer considering invasive procedures  Eudelia Bunch, Pharm.D. [phone] 02/14/2015 9:18 AM

## 2015-02-14 NOTE — Progress Notes (Signed)
Contacted Dr. Einar Gip per Dr Anastasia Pall request to schedule family conference.  Dr. Einar Gip will see the family tomorrow morning between 08:30-0900.  Contacted pt's husband, Genni Buske to confirm meeting time.

## 2015-02-14 NOTE — Progress Notes (Signed)
PULMONARY / CRITICAL CARE MEDICINE   Name: Alison Gross MRN: [id number] DOB: [birth year]    ADMISSION DATE:  02/09/2015  CHIEF COMPLAINT:  Symptomatic bradycardia with hypotension  BRIEF: 81 y/o female with a distant history of breast cancer, R pleurex for exudative effusion (s/p pleuridesis there) admitted for symptomatic bradycardia 12/2.  Subjective: no acute events, MAP goal 55,    VITAL SIGNS: Temp:  [97.8 F (36.6 C)-99.7 F (37.6 C)] 97.8 F (36.6 C) (12/07 0807) Pulse Rate:  [60-84] 84 (12/07 0900) Resp:  [14-33] 26 (12/07 0900) BP: (84-131)/(40-64) 84/57 mmHg (12/07 0900) SpO2:  [96 %-100 %] 100 % (12/07 0900) Weight:  [48.4 kg (106 lb 11.2 oz)] 48.4 kg (106 lb 11.2 oz) (12/07 0500) HEMODYNAMICS:   VENTILATOR SETTINGS:   INTAKE / OUTPUT:  Intake/Output Summary (Last 24 hours) at 02/14/15 0933 Last data filed at 02/14/15 0900  Gross per 24 hour  Intake  979.1 ml  Output    375 ml  Net  604.1 ml    PHYSICAL EXAMINATION:  Gen: chronically ill appearing, good spirits HENT: NCAT EOMi PULM: CTA B, diminished slightly left base CV: RRR, no mgr GI: BS+, soft, nontender MSK: diminished bulk, tone Derm: anasarca noted Neuro: Awake and alert, no acute distress  LABS:  CBC  Recent Labs Lab 02/12/15 0326 02/13/15 0410 02/14/15 0430  WBC 8.2 6.0 6.7  HGB 8.9* 8.5* 8.8*  HCT 27.5* 27.4* 28.2*  PLT 205 168 199   Coag's  Recent Labs Lab 02/09/15 1634 02/10/15 0411 02/10/15 0440 02/12/15 0327  APTT  --   --  22*  --   INR 1.10 1.06  --  1.14   BMET  Recent Labs Lab 02/12/15 0326 02/13/15 0410 02/14/15 0430  NA 133* 133* 132*  K 4.7 3.7 4.0  CL 98* 99* 98*  CO2 28 29 28   BUN 27* 16 24*  CREATININE 4.05* 2.72* 3.73*  GLUCOSE 142* 133* 163*   Electrolytes  Recent Labs Lab 02/12/15 0326 02/13/15 0410 02/14/15 0430  CALCIUM 8.4* 8.3* 8.4*  MG 2.0 2.0 2.1  PHOS 4.8* 3.9 4.1   Sepsis Markers  Recent Labs Lab 02/10/15 0423   LATICACIDVEN 1.4   ABG No results for input(s): PHART, PCO2ART, PO2ART in the last 168 hours. Liver Enzymes  Recent Labs Lab 02/09/15 1634  02/12/15 0326 02/13/15 0410 02/14/15 0430  AST 27  --   --   --   --   ALT 27  --   --   --   --   ALKPHOS 143*  --   --   --   --   BILITOT 0.5  --   --   --   --   ALBUMIN 2.2*  < > 1.9* 2.0* 2.0*  < > = values in this interval not displayed. Cardiac Enzymes No results for input(s): TROPONINI, PROBNP in the last 168 hours. Glucose  Recent Labs Lab 02/09/15 2152 02/10/15 0219  GLUCAP 93 98    Imaging No results found.   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 Admitted with bradycardia due to digoxin, resolved. Profoundly deconditioned.  ASSESSMENT / PLAN:  PULMONARY A: Bilateral pleural effusions R>L, persistent production despite talc pleuridesis; suspect related to her paraproteinemia P:   I don't think there is a role for left thoracentesis  unless she is symptomatic  CARDIOVASCULAR A:  Chronic diastolic heart failure likely due to paraptoteinemia Chronic hypotension > still requiring vasopressor support today Afib Bradycardia > resolved P: Wean off levophed Change MAP goal to 55 Continue heparin gtt for now Tele Continue Midodrine  RENAL A:   ESRD, dialysis dependent P:   Continue HD  GASTROINTESTINAL A:   NO acute issues P:   Advance diet as tolerated  HEMATOLOGIC A:   MGUS vs early myeloma per 12/2014 bone marrow biopsy Dr. Griffith Citron feels she is not a good candidate for treatment Breast cancer in remission P:  Transfuse if Hgb < 7gm/dL Monitor for bleeding  INFECTIOUS A:   No acute issues P:   Monitor for fever  ENDOCRINE A:   Large thyroid mass, goiter, she is too sick to consider resection P:     NEUROLOGIC A:    Muscular deconditioning P:   Out of bed PT/OT consult   FAMILY  - Updates: Updated the family at length today at bedside, discussed situation with Dr. Einar Gip who knows her case well.  We agree that considering the underlying cause (early myeloma) and her profound deconditioning and inability to receive treatment (Dr. Griffith Citron felt she would not regain renal function), it is reasonable to consider hospice.  Will update family today.   - Inter-disciplinary family meet or Palliative Care meeting due by: day 7  My cc time 45 minutes  Roselie Awkward, MD [place] [facility] Pager: [phone] Cell: [phone] After 3pm or if no response, call [phone]   02/14/2015, 2:36 PM

## 2015-02-14 NOTE — Progress Notes (Signed)
[facility] ROUNDING NOTE   Subjective:   Interval History: Pressor dependency no complaints  Objective:  Vital signs in last 24 hours:  Temp:  [97.8 F (36.6 C)-99.7 F (37.6 C)] 97.8 F (36.6 C) (12/07 0807) Pulse Rate:  [60-84] 65 (12/07 0930) Resp:  [14-33] 25 (12/07 0930) BP: (84-131)/(40-64) 114/59 mmHg (12/07 0930) SpO2:  [96 %-100 %] 100 % (12/07 0930) Weight:  [48.4 kg (106 lb 11.2 oz)] 48.4 kg (106 lb 11.2 oz) (12/07 0500)  Weight change: -0.4 kg (-14.1 oz) Filed Weights   02/12/15 1227 02/13/15 0345 02/14/15 0500  Weight: 46.4 kg (102 lb 4.7 oz) 46.7 kg (102 lb 15.3 oz) 48.4 kg (106 lb 11.2 oz)    Intake/Output: I/O last 3 completed shifts: In: 1362.9 [P.O.:480; I.V.:622.9; Other:20; NG/GT:240] Out: 825 [Drains:825]   Intake/Output this shift:  Total I/O In: 278.7 [P.O.:250; I.V.:28.7] Out: -   CVS- Irregular 2/6 faint systolic murmur  RS- Bilateral diminished at bases L > R Otherwise clear R IJ catheter  ABD- Soft non distended with bowel sounds  EXT- noedema  Prior mastectomy L>R LFA AVF +bruit, wounds intact   Basic Metabolic Panel:  Recent Labs Lab 02/10/15 0411 02/11/15 0522 02/12/15 0326 02/13/15 0410 02/14/15 0430  NA 138 136 133* 133* 132*  K 4.5 4.5 4.7 3.7 4.0  CL 101 100* 98* 99* 98*  CO2 28 27 28 29 28   GLUCOSE 107* 131* 142* 133* 163*  BUN 16 18 27* 16 24*  CREATININE 3.06* 3.13* 4.05* 2.72* 3.73*  CALCIUM 8.4* 8.4* 8.4* 8.3* 8.4*  MG 1.9 2.0 2.0 2.0 2.1  PHOS 4.3 4.7* 4.8* 3.9 4.1    Liver Function Tests:  Recent Labs Lab 02/09/15 1634 02/11/15 0522 02/12/15 0326 02/13/15 0410 02/14/15 0430  AST 27  --   --   --   --   ALT 27  --   --   --   --   ALKPHOS 143*  --   --   --   --   BILITOT 0.5  --   --   --   --   PROT 5.5*  --   --   --   --   ALBUMIN 2.2* 1.9* 1.9* 2.0* 2.0*    Recent Labs Lab 02/10/15 0440  LIPASE 24   No results for input(s): AMMONIA in the last 168  hours.  CBC:  Recent Labs Lab 02/09/15 1634  02/10/15 0411 02/11/15 0522 02/12/15 0326 02/13/15 0410 02/14/15 0430  WBC 7.8  --  9.2 8.7 8.2 6.0 6.7  NEUTROABS 5.5  --   --   --   --   --   --   HGB 9.8*  < > 9.5* 9.1* 8.9* 8.5* 8.8*  HCT 31.4*  < > 29.6* 29.5* 27.5* 27.4* 28.2*  MCV 89.0  --  89.4 89.9 89.9 89.3 89.2  PLT 195  --  158 189 205 168 199  < > = values in this interval not displayed.  Cardiac Enzymes: No results for input(s): CKTOTAL, CKMB, CKMBINDEX, TROPONINI in the last 168 hours.  BNP: Invalid input(s): POCBNP  CBG:  Recent Labs Lab 02/09/15 2152 02/10/15 0219  GLUCAP 93 98    Microbiology: Results for orders placed or performed during the hospital encounter of 02/09/15  MRSA PCR Screening     Status: None   Collection Time: 02/10/15  2:20 AM  Result Value Ref Range Status   MRSA by PCR NEGATIVE NEGATIVE Final  Comment:        The GeneXpert MRSA Assay (FDA approved for NASAL specimens only), is one component of a comprehensive MRSA colonization surveillance program. It is not intended to diagnose MRSA infection nor to guide or monitor treatment for MRSA infections.   Blood culture (routine x 2)     Status: None (Preliminary result)   Collection Time: 02/10/15  2:50 AM  Result Value Ref Range Status   Specimen Description BLOOD RIGHT FOREARM  Final   Special Requests BOTTLES DRAWN AEROBIC AND ANAEROBIC 10CC  Final   Culture NO GROWTH 3 DAYS  Final   Report Status PENDING  Incomplete  Culture, blood (routine x 2)     Status: None (Preliminary result)   Collection Time: 02/10/15  3:35 AM  Result Value Ref Range Status   Specimen Description BLOOD CENTRAL LINE  Final   Special Requests BOTTLES DRAWN AEROBIC AND ANAEROBIC 10CC  Final   Culture NO GROWTH 2 DAYS  Final   Report Status PENDING  Incomplete    Coagulation Studies:  Recent Labs  02/12/15 0327  LABPROT 14.8  INR 1.14    Urinalysis: No results for input(s):  COLORURINE, LABSPEC, PHURINE, GLUCOSEU, HGBUR, BILIRUBINUR, KETONESUR, PROTEINUR, UROBILINOGEN, NITRITE, LEUKOCYTESUR in the last 72 hours.  Invalid input(s): APPERANCEUR    Imaging: Ct Chest Wo Contrast  02/12/2015  CLINICAL DATA:  Left-sided chest pain and dyspnea preceding initiation of hemodialysis. EXAM: CT CHEST WITHOUT CONTRAST TECHNIQUE: Multidetector CT imaging of the chest was performed following the standard protocol without IV contrast. COMPARISON:  02/09/2015 FINDINGS: Mediastinum/Lymph Nodes: 2 catheters from right internal jugular approach are seen terminating in the superior vena cava. A catheter from left internal jugular approach is also seen terminating in the distal superior vena cava. No pathologically enlarged lymph nodes identified on this un-enhanced exam. Again seen is a large heterogeneous anterior mediastinal mass extending on the right to the level of the carina. On previous CT exams this was determined to represent an extension of the thyroid gland. On this exam the thyroid gland is not visualized, as it is collimated off the image. The trachea, and main bronchi are patent. The heart is enlarged. There is no evidence of pericardial effusion. Atherosclerotic disease of the aorta is seen. Lungs/Pleura: There are bilateral pleural effusions, moderate in size, larger one on the left. Compressive atelectasis is seen in bilateral lung bases. Segmental atelectasis of the left lower lobe is noted. Pericentral and centrilobular emphysema of the lungs is seen. Upper abdomen: No acute findings. Musculoskeletal: No chest wall mass or suspicious bone lesions identified. Multilevel osteoarthritic changes of the thoracic spine are seen. IMPRESSION: Large heterogeneous anterior mediastinal mass, previously determined to represent a thyroid mass. Thyroid cancer cannot be excluded with this appearance. Bilateral moderate in size pleural effusions, the larger of which is on the left, with secondary  compressive atelectasis and collapse of significant portion of the left lower lobe. Lung emphysema. Electronically Signed   By: Fidela Salisbury M.D.   On: 02/12/2015 17:07     Medications:   . heparin 1,250 Units/hr (02/14/15 4765)  . norepinephrine (LEVOPHED) Adult infusion 1.5 mcg/min (02/14/15 0831)   . anastrozole  1 mg Oral Daily  . antiseptic oral rinse  7 mL Mouth Rinse BID  . feeding supplement (NEPRO CARB STEADY)  237 mL Oral TID BM  . methimazole  20 mg Oral Daily  . midodrine  10 mg Oral TID WC  . multivitamin with minerals  1 tablet  Oral QODAY  . sodium chloride  500 mL Intravenous Once  . thiamine  100 mg Oral Daily   sodium chloride, acetaminophen, albuterol, heparin, HYDROcodone-acetaminophen, ondansetron (ZOFRAN) IV, senna-docusate  Assessment/ Plan:  End stage renal disease :::: Outpatient orders MWF 4h 51.5kg 3/2.25 bath Heparin 1600 R IJ / L FA AVG   Venofer 50/wk Mircera 150 q2 wks last 11/30  1 Flank pain/ new L pleural effusion - considering throacentesis per CCM 2 Bradycardia junct, symptomatic improved off dig, may need PPM Continuing telemetry for now 3 Recurrent pleural eff, s/p pleurex R side 4 Hypotens on midodrine 99m TID 5 Edema will continue to challenge dry weight May use albumin to support 6 ESRD hd via cath, has new AVF left arm HD 12/7 7 Chronic diast HF 8 BRCA bilat , in remission 9 Anemia Hb 8.9 Venofer/micera 150 q 2 weeks  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 These were unresolved and suggest palliative care consult    LOS: 4 Celia Gibbons W @TODAY @10 :13 AM

## 2015-02-15 DIAGNOSIS — L8993 Pressure ulcer of unspecified site, stage 3: Secondary | ICD-10-CM | POA: Diagnosis present

## 2015-02-15 DIAGNOSIS — Z515 Encounter for palliative care: Secondary | ICD-10-CM

## 2015-02-15 LAB — CBC WITH DIFFERENTIAL/PLATELET
BASOS PCT: 1 %
Basophils Absolute: 0 10*3/uL (ref 0.0–0.1)
Eosinophils Absolute: 0.4 10*3/uL (ref 0.0–0.7)
Eosinophils Relative: 7 %
HEMATOCRIT: 27.8 % — AB (ref 36.0–46.0)
Hemoglobin: 8.8 g/dL — ABNORMAL LOW (ref 12.0–15.0)
LYMPHS ABS: 0.8 10*3/uL (ref 0.7–4.0)
LYMPHS PCT: 13 %
MCH: 28.5 pg (ref 26.0–34.0)
MCHC: 31.7 g/dL (ref 30.0–36.0)
MCV: 90 fL (ref 78.0–100.0)
MONO ABS: 0.6 10*3/uL (ref 0.1–1.0)
MONOS PCT: 10 %
NEUTROS ABS: 4.2 10*3/uL (ref 1.7–7.7)
Neutrophils Relative %: 69 %
Platelets: 144 10*3/uL — ABNORMAL LOW (ref 150–400)
RBC: 3.09 MIL/uL — ABNORMAL LOW (ref 3.87–5.11)
RDW: 17.2 % — AB (ref 11.5–15.5)
WBC: 6.1 10*3/uL (ref 4.0–10.5)

## 2015-02-15 LAB — RENAL FUNCTION PANEL
ALBUMIN: 1.8 g/dL — AB (ref 3.5–5.0)
Anion gap: 5 (ref 5–15)
BUN: 16 mg/dL (ref 6–20)
CO2: 30 mmol/L (ref 22–32)
Calcium: 8.3 mg/dL — ABNORMAL LOW (ref 8.9–10.3)
Chloride: 100 mmol/L — ABNORMAL LOW (ref 101–111)
Creatinine, Ser: 2.72 mg/dL — ABNORMAL HIGH (ref 0.44–1.00)
GFR calc Af Amer: 18 mL/min — ABNORMAL LOW (ref >60)
GFR calc non Af Amer: 15 mL/min — ABNORMAL LOW (ref >60)
GLUCOSE: 132 mg/dL — AB (ref 65–99)
PHOSPHORUS: 3.2 mg/dL (ref 2.5–4.6)
POTASSIUM: 3.7 mmol/L (ref 3.5–5.1)
Sodium: 135 mmol/L (ref 135–145)

## 2015-02-15 LAB — CULTURE, BLOOD (ROUTINE X 2)
Culture: NO GROWTH
Culture: NO GROWTH

## 2015-02-15 LAB — HEPARIN LEVEL (UNFRACTIONATED): Heparin Unfractionated: 0.47 IU/mL (ref 0.30–0.70)

## 2015-02-15 MED ORDER — LIDOCAINE HCL (PF) 1 % IJ SOLN: 5.0000 mL | INTRAMUSCULAR | Status: DC | PRN

## 2015-02-15 MED ORDER — SODIUM CHLORIDE 0.9 % IV SOLN: 100.0000 mL | INTRAVENOUS | Status: DC | PRN

## 2015-02-15 MED ORDER — LIDOCAINE-PRILOCAINE 2.5-2.5 % EX CREA
1.0000 "application " | TOPICAL_CREAM | CUTANEOUS | Status: DC | PRN
  Filled 2015-02-15: qty 5

## 2015-02-15 MED ORDER — SORBITOL 70 % SOLN
30.0000 mL | Freq: Every day | Status: DC | PRN
  Filled 2015-02-15: qty 30

## 2015-02-15 MED ORDER — PENTAFLUOROPROP-TETRAFLUOROETH EX AERO
1.0000 | INHALATION_SPRAY | CUTANEOUS | Status: DC | PRN
Start: 2015-02-15 — End: 2015-02-16

## 2015-02-15 MED ORDER — ALTEPLASE 2 MG IJ SOLR
2.0000 mg | Freq: Once | INTRAMUSCULAR | Status: DC | PRN
  Filled 2015-02-15: qty 2

## 2015-02-15 MED ORDER — LIDOCAINE-PRILOCAINE 2.5-2.5 % EX CREA: 1.0000 "application " | TOPICAL_CREAM | CUTANEOUS | Status: DC | PRN

## 2015-02-15 MED ORDER — ALTEPLASE 2 MG IJ SOLR: 2.0000 mg | Freq: Once | INTRAMUSCULAR | Status: DC | PRN

## 2015-02-15 MED ORDER — HEPARIN SODIUM (PORCINE) 1000 UNIT/ML DIALYSIS: 1000.0000 [IU] | INTRAMUSCULAR | Status: DC | PRN

## 2015-02-15 MED ORDER — PENTAFLUOROPROP-TETRAFLUOROETH EX AERO: 1.0000 "application " | INHALATION_SPRAY | CUTANEOUS | Status: DC | PRN

## 2015-02-15 MED ORDER — SENNOSIDES-DOCUSATE SODIUM 8.6-50 MG PO TABS
1.0000 | ORAL_TABLET | Freq: Every day | ORAL | Status: DC
  Administered 2015-02-15 – 2015-02-18 (×3): 1 via ORAL
  Filled 2015-02-15 (×4): qty 1

## 2015-02-15 NOTE — Progress Notes (Addendum)
Subjective:  Patient remains hospitalized, no improvement in condition despite aggressive medical therapy.  Family conference was requested by Dr. Lake Bells and pt's husband to discuss prognosis and medical decision making.   Objective:  Vital Signs in the last 24 hours: Temp:  [97.4 F (36.3 C)-98.7 F (37.1 C)] 97.5 F (36.4 C) (12/08 1157) Pulse Rate:  [60-130] 69 (12/08 1300) Resp:  [13-33] 25 (12/08 1300) BP: (86-128)/(35-108) 87/50 mmHg (12/08 1300) SpO2:  [64 %-100 %] 100 % (12/08 1300) Weight:  [46 kg (101 lb 6.6 oz)] 46 kg (101 lb 6.6 oz) (12/08 0415)  Intake/Output from previous day: 12/07 0701 - 12/08 0700 In: 806.9 [P.O.:500; I.V.:306.9] Out: 2075 [Urine:75]  Physical Exam: General appearance: alert, cooperative, appears stated age, no distress and appears wasted Eyes: negative findings: lids and lashes normal Neck: no carotid bruit, no JVD and supple, symmetrical, trachea midline Resp: decreased breath sounds bilaterally more than halfway at the bases. No wheeze.  Chest wall: no tenderness Cardio: S1, S2 normal, no S3 or S4 and systolic murmur: early systolic 2-9/7, crescendo at lower left sternal border, at apex GI: soft, non-tender; bowel sounds normal; no masses, no organomegaly Extremities: LE edema 2+, nontender. Full range of motion in extremities. Right upper extremity 3 plus edema (IV fluid infiltration)   Lab Results: BMP  Recent Labs  02/13/15 0410 02/14/15 0430 02/15/15 0420  NA 133* 132* 135  K 3.7 4.0 3.7  CL 99* 98* 100*  CO2 '29 28 30  '$ GLUCOSE 133* 163* 132*  BUN 16 24* 16  CREATININE 2.72* 3.73* 2.72*  CALCIUM 8.3* 8.4* 8.3*  GFRNONAA 15* 11* 15*  GFRAA 18* 12* 18*    CBC  Recent Labs Lab 02/15/15 0420  WBC 6.1  RBC 3.09*  HGB 8.8*  HCT 27.8*  PLT 144*  MCV 90.0  MCH 28.5  MCHC 31.7  RDW 17.2*  LYMPHSABS 0.8  MONOABS 0.6  EOSABS 0.4  BASOSABS 0.0    HEMOGLOBIN A1C Lab Results  Component Value Date   HGBA1C 6.2*  07/27/2014   MPG 131 07/27/2014    TSH  Recent Labs  07/27/14 0515 08/23/14 0822 01/01/15 1535  TSH 1.036 1.115 <0.010*     Recent Labs  07/26/14 1825  08/24/14 1420  11/07/14 1206 01/01/15 1125  02/09/15 1634  02/13/15 0410 02/14/15 0430 02/15/15 0420  PROT 7.1  < > 6.1*  < > 6.6 5.5*  --  5.5*  --   --   --   --   ALBUMIN 3.5  < > 3.0*  < > 2.7* 2.6*  < > 2.2*  < > 2.0* 2.0* 1.8*  AST 38  < > 24  < > 37 28  --  27  --   --   --   --   ALT 33  < > 15  < > 35 30  --  27  --   --   --   --   ALKPHOS 85  < > 65  < > 83 93  --  143*  --   --   --   --   BILITOT 0.9  < > 0.6  < > 1.0 0.3  --  0.5  --   --   --   --   BILIDIR 0.2  --  <0.1*  --   --   --   --   --   --   --   --   --  IBILI 0.7  --  NOT CALCULATED  --   --   --   --   --   --   --   --   --   < > = values in this interval not displayed.  Scheduled Meds: . anastrozole  1 mg Oral Daily  . antiseptic oral rinse  7 mL Mouth Rinse BID  . feeding supplement (NEPRO CARB STEADY)  237 mL Oral TID BM  . methimazole  20 mg Oral Daily  . midodrine  10 mg Oral TID WC  . multivitamin with minerals  1 tablet Oral QODAY  . sodium chloride  500 mL Intravenous Once  . thiamine  100 mg Oral Daily   Continuous Infusions: . heparin 1,250 Units/hr (02/14/15 1804)  . norepinephrine (LEVOPHED) Adult infusion Stopped (02/14/15 1221)   PRN Meds:.sodium chloride, sodium chloride, sodium chloride, sodium chloride, sodium chloride, acetaminophen, albuterol, alteplase, heparin, heparin, heparin, HYDROcodone-acetaminophen, lidocaine (PF), lidocaine-prilocaine, ondansetron (ZOFRAN) IV, pentafluoroprop-tetrafluoroeth, senna-docusate   Cardiac Studies: EKG 02/09/2015: Normal sinus rhythm, left axis deviation, left plantar physical block. Poor R progression, LVH with repolarization of normality, cannot exclude lateral ischemia.  Echocardiogram 01/02/2015: Left ventricle: The cavity size was normal. Wall thickness wasincreased in a  pattern of moderate LVH. Systolic function wasnormal. The estimated ejection fraction was in the range of 60%to 65%. Wall motion was normal; there were no regional wallmotion abnormalities. - Aortic valve: There was mild regurgitation. Valve area (VTI):1.87 cm^2. Valve area (Vmax): 1.8 cm^2. Valve area (Vmean): 1.88 cm^2. - Mitral valve: Valve area by continuity equation (using LVOTflow): 1.76 cm^2. - Left atrium: The atrium was moderately dilated. - Right atrium: The atrium was moderately dilated. - Pulmonary arteries: Systolic pressure was severely increased. PA peak pressure: 72 mm Hg (S). - Pericardium, extracardiac: A small, free-flowing pericardialeffusion was identified circumferential to the heart. There wasno evidence of hemodynamic compromise.  Echocardiogram 09/26/2014: LVEF 60-65% with grade 2 diastolic dysfunction, quadricuspid aortic valve, mild mitral regurgitation, severe left atrial enlargement. Lexiscan sestamibi stress test 09/25/2014: Perfusion: Small fixed defect at the inferior lateral wall on rest and perfusion images. No reversible ischemia identified. Wall Motion: Normal left ventricular wall motion. No left ventricular dilation. Left Ventricular Ejection Fraction: 62 %  Assessment/Plan:   1. Pleuritic left-sided chest pain, now resolved.  2. Junctional rhythm on admission, patient is presently normal sinus rhythm. Off digoxin. 3. Recurrent right pleural effusion, worse than the left pleural effusion, due to mediastinal mass/thyroid mass not a candidate for surgery. Has right Pleurx catheter in place 4. Paroxysmal atrial fibrillation 5. End-stage not disease on hemodialysis 6. Severe anemia and hypoproteinemia 7. History of breast cancer status post bilateral mastectomy 2014 8. Chronic diastolic heart failure, echocardiogram findings are suggestive of infiltrative cardiomyopathy with restrictive physiology 9. Severe pulmonary hypertension secondary  to underlying mediastinal and thoracic issues. 10. Hyperthyroidism  Recommendation:  Discussed patient's prognosis extensively with patient and family. Patient is completely alert and oriented and fully understands the severity of her multiple medical conditions. She, unfortunately, has not responded to aggressive medical therapy and her condition continues to deteriorate.  Pt and her family wish to discontinue aggressive measures at this time, including hemodialysis, and proceed with palliative care only. Palliative medicine has been consulted. This is 60 minutes of face to face discussion with patient and family (Husband and 2 daughters). I have discussed with Dr. Lake Bells and also with Dr. Edrick Oh (nephrology) to discontinue all medications except comfort care and to discontinue dialysis.  Patient initially started on hemodialysis due to fluid overload state.  In spite of hemodialysis, patient continues to have recurrent pleural effusion now has increasing left pleural effusion.  Also she is intravascularly volume depleted.  This leads to worsening diastolic heart failure, patient also has severe LVH and inflated to cardiomyopathy.  With multiorgan failure, severe anemia and hypoproteinemia, failure to thrive, best option would be palliative care and eventually transfer her to hospice which patient's family are willing to consider.  I will discontinue all her medications that are not related to comfort, including heparin, methimazole.   Adrian Prows, M.D.[facility] 02/15/2015, 1:12 PM [facility] Pager: [phone] Office: [phone] If no answer: [phone]

## 2015-02-15 NOTE — Progress Notes (Signed)
PULMONARY / CRITICAL CARE MEDICINE   Name: Alison Gross MRN: [id number] DOB: [birth year]    ADMISSION DATE:  02/09/2015  CHIEF COMPLAINT:  Symptomatic bradycardia with hypotension  BRIEF: 81 y/o female with a distant history of breast cancer, R pleurex for exudative effusion (s/p pleuridesis there) admitted for symptomatic bradycardia 12/2.  Subjective:  Planning for hospice D/c from unit today Last HD today Keep draining pleurex cath   VITAL SIGNS: Temp:  [97.5 F (36.4 C)-98.7 F (37.1 C)] 98.5 F (36.9 C) (12/08 0803) Pulse Rate:  [60-130] 65 (12/08 1100) Resp:  [13-33] 22 (12/08 1100) BP: (86-128)/(35-108) 90/40 mmHg (12/08 1100) SpO2:  [64 %-100 %] 100 % (12/08 1100) Weight:  [46 kg (101 lb 6.6 oz)] 46 kg (101 lb 6.6 oz) (12/08 0415) HEMODYNAMICS:   VENTILATOR SETTINGS:   INTAKE / OUTPUT:  Intake/Output Summary (Last 24 hours) at 02/15/15 1109 Last data filed at 02/15/15 1000  Gross per 24 hour  Intake  538.4 ml  Output   2075 ml  Net -1536.6 ml    PHYSICAL EXAMINATION:  Gen: chronically ill appearing, good spirits HENT: NCAT EOMi PULM: CTA B, diminished slightly left base CV: RRR, no mgr GI: BS+, soft, nontender MSK: diminished bulk, tone Derm: anasarca noted Neuro: Awake and alert, no acute distress  LABS:  CBC  Recent Labs Lab 02/13/15 0410 02/14/15 0430 02/15/15 0420  WBC 6.0 6.7 6.1  HGB 8.5* 8.8* 8.8*  HCT 27.4* 28.2* 27.8*  PLT 168 199 144*   Coag's  Recent Labs Lab 02/09/15 1634 02/10/15 0411 02/10/15 0440 02/12/15 0327  APTT  --   --  22*  --   INR 1.10 1.06  --  1.14   BMET  Recent Labs Lab 02/13/15 0410 02/14/15 0430 02/15/15 0420  NA 133* 132* 135  K 3.7 4.0 3.7  CL 99* 98* 100*  CO2 _0 BUN 16 24* 16  CREATININE 2.72* 3.73* 2.72*  GLUCOSE 133* 163* 132*   Electrolytes  Recent Labs Lab 02/12/15 0326 02/13/15 0410 02/14/15 0430 02/15/15 0420  CALCIUM 8.4* 8.3* 8.4* 8.3*  MG 2.0 2.0 2.1  --    PHOS 4.8* 3.9 4.1 3.2   Sepsis Markers  Recent Labs Lab 02/10/15 0423  LATICACIDVEN 1.4   ABG No results for input(s): PHART, PCO2ART, PO2ART in the last 168 hours. Liver Enzymes  Recent Labs Lab 02/09/15 1634  02/13/15 0410 02/14/15 0430 02/15/15 0420  AST 27  --   --   --   --   ALT 27  --   --   --   --   ALKPHOS 143*  --   --   --   --   BILITOT 0.5  --   --   --   --   ALBUMIN 2.2*  < > 2.0* 2.0* 1.8*  < > = values in this interval not displayed. Cardiac Enzymes No results for input(s): TROPONINI, PROBNP in the last 168 hours. Glucose  Recent Labs Lab 02/09/15 2152 02/10/15 0219  GLUCAP 93 98    Imaging No results found.   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 Admitted with bradycardia due to digoxin, resolved. Profoundly deconditioned.  Family has chosen to not proceed with further aggressive care.  They would prefer to go on hospice considering the  underlying condition.    ASSESSMENT / PLAN:  PULMONARY A: Bilateral pleural effusions R>L, persistent production despite talc pleuridesis; suspect related to her paraproteinemia P:   I don't think there is a role for left thoracentesis unless she is symptomatic Would continue to drain R pleural effusion daily   CARDIOVASCULAR A:  Chronic diastolic heart failure likely due to paraptoteinemia Afib Bradycardia > resolved P: DNR Continue Midodrine  RENAL A:   ESRD, dialysis dependent P:   Last session HD today  GASTROINTESTINAL A:   NO acute issues P:   Advance diet as tolerated  HEMATOLOGIC A:   MGUS vs early myeloma per 12/2014 bone marrow biopsy Dr. Griffith Citron feels she is not a good candidate for treatment Breast cancer in remission P:  Monitor for bleeding  INFECTIOUS A:   No acute issues P:   Monitor for  fever  ENDOCRINE A:   Large thyroid mass, goiter, she is too sick to consider resection P:     NEUROLOGIC A:   Muscular deconditioning P:   Out of bed PT/OT consult   FAMILY  - Updates: Family updated at length, they have decided to proceed with hospice.  The goal is to see how she does initially in house over the weekend, if symptoms progress she will need in patient hospice.    - Inter-disciplinary family meet or Palliative Care meeting due by: day 7  Transfer to floor.   Roselie Awkward, MD [facility] Pager: [phone] Cell: [phone] After 3pm or if no response, call [phone]   02/15/2015, 11:09 AM

## 2015-02-15 NOTE — Progress Notes (Signed)
[facility] ROUNDING NOTE   Subjective:   Interval History: not making any progress and patient/family to discontinue dialysis -- discussed with Dr Einar Gip  Objective:  Vital signs in last 24 hours:  Temp:  [97.5 F (36.4 C)-98.7 F (37.1 C)] 98.5 F (36.9 C) (12/08 0803) Pulse Rate:  [60-130] 73 (12/08 1000) Resp:  [13-33] 19 (12/08 1000) BP: (86-128)/(35-108) 86/51 mmHg (12/08 1000) SpO2:  [64 %-100 %] 100 % (12/08 1000) Weight:  [46 kg (101 lb 6.6 oz)] 46 kg (101 lb 6.6 oz) (12/08 0415)  Weight change: -2.4 kg (-5 lb 4.7 oz) Filed Weights   02/13/15 0345 02/14/15 0500 02/15/15 0415  Weight: 46.7 kg (102 lb 15.3 oz) 48.4 kg (106 lb 11.2 oz) 46 kg (101 lb 6.6 oz)    Intake/Output: I/O last 3 completed shifts: In: 1239.3 [P.O.:500; I.V.:499.3; NG/GT:240] Out: 2450 [Urine:75; Drains:375; Other:2000]   Intake/Output this shift:  Total I/O In: 37.5 [I.V.:37.5] Out: -   CVS- Irregular 2/6 faint systolic murmur  RS- Bilateral diminished at bases L > R Otherwise clear R IJ catheter  ABD- Soft non distended with bowel sounds  EXT- noedema  Prior mastectomy L>R LFA AVF +bruit, wounds intact   Basic Metabolic Panel:  Recent Labs Lab 02/10/15 0411 02/11/15 0522 02/12/15 0326 02/13/15 0410 02/14/15 0430 02/15/15 0420  NA 138 136 133* 133* 132* 135  K 4.5 4.5 4.7 3.7 4.0 3.7  CL 101 100* 98* 99* 98* 100*  CO2 28 27 28 29 28 30   GLUCOSE 107* 131* 142* 133* 163* 132*  BUN 16 18 27* 16 24* 16  CREATININE 3.06* 3.13* 4.05* 2.72* 3.73* 2.72*  CALCIUM 8.4* 8.4* 8.4* 8.3* 8.4* 8.3*  MG 1.9 2.0 2.0 2.0 2.1  --   PHOS 4.3 4.7* 4.8* 3.9 4.1 3.2    Liver Function Tests:  Recent Labs Lab 02/09/15 1634 02/11/15 0522 02/12/15 0326 02/13/15 0410 02/14/15 0430 02/15/15 0420  AST 27  --   --   --   --   --   ALT 27  --   --   --   --   --   ALKPHOS 143*  --   --   --   --   --   BILITOT 0.5  --   --   --   --   --   PROT 5.5*  --   --    --   --   --   ALBUMIN 2.2* 1.9* 1.9* 2.0* 2.0* 1.8*    Recent Labs Lab 02/10/15 0440  LIPASE 24   No results for input(s): AMMONIA in the last 168 hours.  CBC:  Recent Labs Lab 02/09/15 1634  02/11/15 0522 02/12/15 0326 02/13/15 0410 02/14/15 0430 02/15/15 0420  WBC 7.8  < > 8.7 8.2 6.0 6.7 6.1  NEUTROABS 5.5  --   --   --   --   --  4.2  HGB 9.8*  < > 9.1* 8.9* 8.5* 8.8* 8.8*  HCT 31.4*  < > 29.5* 27.5* 27.4* 28.2* 27.8*  MCV 89.0  < > 89.9 89.9 89.3 89.2 90.0  PLT 195  < > 189 205 168 199 144*  < > = values in this interval not displayed.  Cardiac Enzymes: No results for input(s): CKTOTAL, CKMB, CKMBINDEX, TROPONINI in the last 168 hours.  BNP: Invalid input(s): POCBNP  CBG:  Recent Labs Lab 02/09/15 2152 02/10/15 0219  GLUCAP 93 98    Microbiology: Results for orders  placed or performed during the hospital encounter of 02/09/15  MRSA PCR Screening     Status: None   Collection Time: 02/10/15  2:20 AM  Result Value Ref Range Status   MRSA by PCR NEGATIVE NEGATIVE Final    Comment:        The GeneXpert MRSA Assay (FDA approved for NASAL specimens only), is one component of a comprehensive MRSA colonization surveillance program. It is not intended to diagnose MRSA infection nor to guide or monitor treatment for MRSA infections.   Blood culture (routine x 2)     Status: None (Preliminary result)   Collection Time: 02/10/15  2:50 AM  Result Value Ref Range Status   Specimen Description BLOOD RIGHT FOREARM  Final   Special Requests BOTTLES DRAWN AEROBIC AND ANAEROBIC 10CC  Final   Culture NO GROWTH 4 DAYS  Final   Report Status PENDING  Incomplete  Culture, blood (routine x 2)     Status: None (Preliminary result)   Collection Time: 02/10/15  3:35 AM  Result Value Ref Range Status   Specimen Description BLOOD CENTRAL LINE  Final   Special Requests BOTTLES DRAWN AEROBIC AND ANAEROBIC 10CC  Final   Culture NO GROWTH 4 DAYS  Final   Report Status  PENDING  Incomplete    Coagulation Studies: No results for input(s): LABPROT, INR in the last 72 hours.  Urinalysis: No results for input(s): COLORURINE, LABSPEC, PHURINE, GLUCOSEU, HGBUR, BILIRUBINUR, KETONESUR, PROTEINUR, UROBILINOGEN, NITRITE, LEUKOCYTESUR in the last 72 hours.  Invalid input(s): APPERANCEUR    Imaging: No results found.   Medications:   . heparin 1,250 Units/hr (02/14/15 1804)  . norepinephrine (LEVOPHED) Adult infusion Stopped (02/14/15 1221)   . anastrozole  1 mg Oral Daily  . antiseptic oral rinse  7 mL Mouth Rinse BID  . feeding supplement (NEPRO CARB STEADY)  237 mL Oral TID BM  . methimazole  20 mg Oral Daily  . midodrine  10 mg Oral TID WC  . multivitamin with minerals  1 tablet Oral QODAY  . sodium chloride  500 mL Intravenous Once  . thiamine  100 mg Oral Daily   sodium chloride, sodium chloride, sodium chloride, sodium chloride, sodium chloride, acetaminophen, albuterol, alteplase, heparin, heparin, heparin, HYDROcodone-acetaminophen, lidocaine (PF), lidocaine-prilocaine, ondansetron (ZOFRAN) IV, pentafluoroprop-tetrafluoroeth, senna-docusate  Assessment/ Plan:  End stage renal disease :::: Outpatient orders MWF 4h 51.5kg 3/2.25 bath Heparin 1600 R IJ / L FA AVG   Venofer 50/wk Mircera 150 q2 wks last 11/30  1 Flank pain/ new L pleural effusion - considering throacentesis per CCM 2 Bradycardia junct, symptomatic improved off dig, may need PPM Continuing telemetry for now 3 Recurrent pleural eff, s/p pleurex R side 4 Hypotens on midodrine 75m TID 5 Edema will continue to challenge dry weight May use albumin to support 6 ESRD hd via cath, has new AVF left arm HD 12/8 7 Chronic diast HF 8 BRCA bilat , in remission 9 Anemia Hb 8.8 Venofer/micera 150 q 2 weeks  10 MBD CA/ P ok 11 Massive goiter/ intrathoracic extension - not surg candidate/ treated with methimazole 12 Chron afib on coumadin 13 Pulm HTN f/b Dr GEinar Gip14 Hx  MGUS  Difficult situation with a pressor dependent patient and end stage renal disease -- Dr GEinar Gipaddressed End of Life and is to stop dialysis. This weil be the last session. We shall sign off but remain available     LOS: 5 Nylene Inlow W @TODAY @10 :20 AM

## 2015-02-15 NOTE — Procedures (Signed)
I have seen and examined this patient and agree with the plan of care. Seen on dialysis and refer to progress note Orville Widmann W 02/15/2015, 10:24 AM

## 2015-02-15 NOTE — Consult Note (Signed)
Consultation Note Date: 02/15/2015   Patient Name: Alison Gross  DOB: Dec 26, 1932  MRN: [id number]  Age / Sex: 81 y.o., female  PCP: Glendale Chard, MD Referring Physician: Anders Simmonds, MD  Reason for Consultation: Establishing goals of care    Clinical Assessment/Narrative: I met today with Alison Gross today, no family at bedside. Per notes Dr. Einar Gip met with family and patient earlier and they have decided to proceed with more comfort approach including terminating dialysis. Alison Gross confirms this decision. When addressing how she feels about this she tells me "we all have to go at some time." She is not very talkative today but very pleasant and answers all my questions. When asking her what her goals are she does tell me that she plans to return home instead of hospice facility. However her husband and daughter who live with her both work currently. I asked permission to contact them but she says that they are at work and they were already here this morning for a meeting. She also says that they will be at work tomorrow as well. She has no symptoms - still having some vague pains but she says she is not currently hurting and that the pain medication already provided gives her relief. She does complain of constipation - will address. She has no further questions/concerns at this time. Will follow up tomorrow.   Contacts/Participants in Discussion: Primary Decision Maker: Self    SUMMARY OF RECOMMENDATIONS - She wishes to return home but I have not spoken with family to see how this can be arranged.   Code Status/Advance Care Planning: DNR    Code Status Orders        Start     Ordered   02/15/15 1350  Do not attempt resuscitation (DNR)   Continuous    Question Answer Comment  In the event of cardiac or respiratory ARREST Do not call a "code blue"   In the event of cardiac or respiratory ARREST Do not  perform Intubation, CPR, defibrillation or ACLS   In the event of cardiac or respiratory ARREST Use medication by any route, position, wound care, and other measures to relive pain and suffering. May use oxygen, suction and manual treatment of airway obstruction as needed for comfort.   Comments Patient to be referred to Palliative Care.      02/15/15 1350    Advance Directive Documentation        Most Recent Value   Type of Advance Directive  Living will, Healthcare Power of Attorney   Pre-existing out of facility DNR order (yellow form or pink MOST form)     "MOST" Form in Place?         Symptom Management:   Constipation: Senokot-S qhs. Sorbitol daily prn.   Pain: Continue vicodin prn. May need more potent opioid as things progress.   Palliative Prophylaxis:   Bowel Regimen and Frequent Pain Assessment   Psycho-social/Spiritual:  Support System: Difficult to say.  Desire for further Chaplaincy support:no Additional Recommendations: Caregiving  Support/Resources and Education on Hospice  Prognosis: < 2 weeks  Discharge Planning: Home with Hospice   Chief Complaint/ Primary Diagnoses: Present on Admission:  . (Resolved) Pleuritic chest pain . Elevated troponin . ESRD (end stage renal disease) ([place]) . (Resolved) Symptomatic bradycardia . Pressure ulcer stage III ([place])  I have reviewed the medical record, interviewed the patient and family, and examined the patient. The following aspects are pertinent.  Past Medical History  Diagnosis Date  .  Hypercholesterolemia     takes Crestor daily  . Thyroid disease     had iodine radiation  . Hypertension     takes Hyzaar daily  . Dysrhythmia     takes Carvedilol daily  . Pneumonia     history of;last time about 4-12yr ago  . GERD (gastroesophageal reflux disease)     takes Protonix daily  . Gastric ulcer   . History of blood transfusion   . History of colon polyps   . Anemia     takes iron pill daily  . Diabetes  mellitus without complication ([place]     takes Tradjenta daily  . History of radiation therapy 07/12/12-08/26/12    right breast/  . Paroxysmal atrial fibrillation ([place]     PCP EKG 09/27/2013: A. Fibrillation.. EKG 09/29/2013: S. Tach.  . Breast cancer ([place] 04/12/12    right-pos lymph node/left-DCIS  . Shortness of breath on exertion   . Bruit of left carotid artery     12/06/13 carotid duplex: no sign stenosis [facility])  . Acute upper GI bleeding 01/24/2012  . MGUS (monoclonal gammopathy of unknown significance) 04/21/2013  . Osteoporosis 11/29/2013  . Recurrent right pleural effusion 07/26/2014  . Goiter 07/27/2014  . Substernal goiter 12/23/2012  . Pleural effusion, right 12/23/2012  . Dysphonia 09/20/2014  . Vocal cord paralysis     Suspected due to massive substernal goiter  . CHF (congestive heart failure) ([place]   . Stage III chronic kidney disease 07/26/2014  . Type 2 diabetes mellitus with renal manifestations ([place] 07/26/2014  . ESRD (end stage renal disease) ([place]     On Hemodialysis, MWF   Social History   Social History  . Marital Status: Married    Spouse Name: JJeneen Rinks . Number of Children: 2  . Years of Education: N/A   Social History Main Topics  . Smoking status: Former Smoker -- 0.00 packs/day    Types: Cigarettes    Quit date: 07/26/1971  . Smokeless tobacco: Never Used  . Alcohol Use: No  . Drug Use: No  . Sexual Activity: Not Currently    Birth Control/ Protection: Surgical     Comment: menarche age 75,1st live birth [birth year] P2, no HRT   Other Topics Concern  . None   Social History Narrative   Married.  Ambulates independently.     Family History  Problem Relation Age of Onset  . Brain cancer Mother   . Aneurysm Father   . Diabetes Sister   . Diabetes Brother   . Diabetes Sister   . Diabetes Brother   . Heart failure Sister    Scheduled Meds: . antiseptic oral rinse  7 mL Mouth Rinse BID  . feeding supplement (NEPRO CARB STEADY)  237 mL Oral TID  BM  . midodrine  10 mg Oral TID WC  . multivitamin with minerals  1 tablet Oral QODAY  . sodium chloride  500 mL Intravenous Once  . thiamine  100 mg Oral Daily   Continuous Infusions:  PRN Meds:.sodium chloride, sodium chloride, sodium chloride, sodium chloride, sodium chloride, acetaminophen, albuterol, alteplase, heparin, heparin, heparin, HYDROcodone-acetaminophen, lidocaine (PF), lidocaine-prilocaine, ondansetron (ZOFRAN) IV, pentafluoroprop-tetrafluoroeth, senna-docusate Medications Prior to Admission:  Prior to Admission medications   Medication Sig Start Date End Date Taking? Authorizing Provider  acetaminophen (TYLENOL) 325 MG tablet Take 2 tablets (650 mg total) by mouth every 4 (four) hours as needed for headache or mild pain. 01/09/15  Yes DAlbertine Patricia MD  anastrozole (  ARIMIDEX) 1 MG tablet TAKE 1 TABLET (1 MG TOTAL) BY MOUTH DAILY. 10/17/14  Yes Laurie Panda, NP  digoxin (LANOXIN) 0.125 MG tablet Please Take one tablet oral daily from Monday to Friday, not on Saturday or Sunday. Patient taking differently: Take 0.125 mg by mouth See admin instructions. Please Take one tablet oral daily from Monday to Friday, not on Saturday or Sunday. 01/09/15  Yes Albertine Patricia, MD  methimazole (TAPAZOLE) 10 MG tablet Take 2 tablets (20 mg total) by mouth daily. 01/09/15  Yes Albertine Patricia, MD  midodrine (PROAMATINE) 10 MG tablet Take 1 tablet (10 mg total) by mouth 3 (three) times daily with meals. 01/09/15  Yes Albertine Patricia, MD  Multiple Vitamin (MULTIVITAMIN WITH MINERALS) TABS Take 1 tablet by mouth every other day.    Yes Historical Provider, MD  senna-docusate (SENOKOT-S) 8.6-50 MG tablet Take 1 tablet by mouth 2 (two) times daily. Patient taking differently: Take 1 tablet by mouth at bedtime as needed for moderate constipation.  01/09/15  Yes Albertine Patricia, MD  thiamine 100 MG tablet Take 1 tablet (100 mg total) by mouth daily. 11/04/14  Yes Adrian Prows, MD  VENTOLIN  HFA 108 (90 BASE) MCG/ACT inhaler Inhale 2 puffs into the lungs every 4 (four) hours as needed for wheezing or shortness of breath.  10/05/14  Yes Historical Provider, MD  warfarin (COUMADIN) 0.5 mg TABS tablet Take 0.5 mg by mouth daily at 6 PM.   Yes Historical Provider, MD   No Known Allergies  Review of Systems  Constitutional: Positive for appetite change.  Respiratory:       Pleuritic chest pain  Gastrointestinal: Positive for constipation.    Physical Exam  Constitutional: She is oriented to person, place, and time. She appears well-developed.  Cardiovascular: Normal rate.   Respiratory: Effort normal.  GI: Soft. Normal appearance.  Neurological: She is alert and oriented to person, place, and time.  Psychiatric: She has a normal mood and affect.    Vital Signs: BP 96/43 mmHg  Pulse 69  Temp(Src) 97.5 F (36.4 C) (Oral)  Resp 22  Ht _0  (1.651 m)  Wt 46 kg (101 lb 6.6 oz)  BMI 16.88 kg/m2  SpO2 100%  SpO2: SpO2: 100 % O2 Device:SpO2: 100 % O2 Flow Rate: .O2 Flow Rate (L/min): 2 L/min  IO: Intake/output summary:  Intake/Output Summary (Last 24 hours) at 02/15/15 1514 Last data filed at 02/15/15 1405  Gross per 24 hour  Intake   1025 ml  Output   3575 ml  Net  -2550 ml    LBM: Last BM Date: 02/14/15 Baseline Weight: Weight: 49.896 kg (110 lb) Most recent weight: Weight: 46 kg (101 lb 6.6 oz)      Palliative Assessment/Data:  Flowsheet Rows        Most Recent Value   Intake Tab    Referral Department  Nephrology   Unit at Time of Referral  ICU   Palliative Care Primary Diagnosis  Nephrology   Date Notified  02/15/15   Palliative Care Type  Return patient Palliative Care   Reason for referral  Clarify Goals of Care   Date of Admission  02/09/15   # of days IP prior to Palliative referral  6   Clinical Assessment    Psychosocial & Spiritual Assessment    Palliative Care Outcomes       Additional Data Reviewed:  CBC:    Component Value Date/Time    WBC  6.1 02/15/2015 0420   WBC 4.5 09/18/2014 0946   HGB 8.8* 02/15/2015 0420   HGB 9.6* 09/18/2014 0946   HCT 27.8* 02/15/2015 0420   HCT 29.4* 09/18/2014 0946   PLT 144* 02/15/2015 0420   PLT 319 09/18/2014 0946   MCV 90.0 02/15/2015 0420   MCV 87.9 09/18/2014 0946   NEUTROABS 4.2 02/15/2015 0420   NEUTROABS 2.9 09/18/2014 0946   LYMPHSABS 0.8 02/15/2015 0420   LYMPHSABS 0.6* 09/18/2014 0946   MONOABS 0.6 02/15/2015 0420   MONOABS 0.6 09/18/2014 0946   EOSABS 0.4 02/15/2015 0420   EOSABS 0.3 09/18/2014 0946   BASOSABS 0.0 02/15/2015 0420   BASOSABS 0.1 09/18/2014 0946   Comprehensive Metabolic Panel:    Component Value Date/Time   NA 135 02/15/2015 0420   NA 143 09/18/2014 0946   K 3.7 02/15/2015 0420   K 4.0 09/18/2014 0946   CL 100* 02/15/2015 0420   CL 107 08/05/2012 1009   CO2 30 02/15/2015 0420   CO2 30* 09/18/2014 0946   BUN 16 02/15/2015 0420   BUN 31.2* 09/18/2014 0946   CREATININE 2.72* 02/15/2015 0420   CREATININE 1.7* 09/18/2014 0946   GLUCOSE 132* 02/15/2015 0420   GLUCOSE 94 09/18/2014 0946   GLUCOSE 128* 08/05/2012 1009   CALCIUM 8.3* 02/15/2015 0420   CALCIUM 11.8* 09/18/2014 0946   CALCIUM 10.7* 07/27/2014 0830   AST 27 02/09/2015 1634   AST 27 09/18/2014 0946   ALT 27 02/09/2015 1634   ALT 18 09/18/2014 0946   ALKPHOS 143* 02/09/2015 1634   ALKPHOS 78 09/18/2014 0946   BILITOT 0.5 02/09/2015 1634   BILITOT 0.68 09/18/2014 0946   PROT 5.5* 02/09/2015 1634   PROT 6.8 09/18/2014 0946   ALBUMIN 1.8* 02/15/2015 0420   ALBUMIN 3.3* 09/18/2014 0946     Time In: 1430 Time Out: 1510 Time Total: 4mn Greater than 50%  of this time was spent counseling and coordinating care related to the above assessment and plan.  Signed by: PPershing Proud NP  APershing Proud NP  125/11/1026 3:14 PM  Please contact Palliative Medicine Team phone at [phone] questions and concerns.

## 2015-02-15 NOTE — Progress Notes (Signed)
ANTICOAGULATION CONSULT NOTE - F/U Consult  Pharmacy Consult for Heparin  Indication: atrial fibrillation  No Known Allergies  Patient Measurements: Height: '5\' 5"'$  (165.1 cm) Weight: 101 lb 6.6 oz (46 kg) IBW/kg (Calculated) : 57  Vital Signs: Temp: 98.5 F (36.9 C) (12/08 0803) Temp Source: Oral (12/08 0803) BP: 93/43 mmHg (12/08 0600) Pulse Rate: 74 (12/08 0730)  Labs:  Recent Labs  02/13/15 0410 02/13/15 1520 02/14/15 0430 02/14/15 0510 02/15/15 0420  HGB 8.5*  --  8.8*  --  8.8*  HCT 27.4*  --  28.2*  --  27.8*  PLT 168  --  199  --  144*  HEPARINUNFRC 0.26* 0.42  --  0.39 0.47  CREATININE 2.72*  --  3.73*  --  2.72*    Estimated Creatinine Clearance: 12 mL/min (by C-G formula based on Cr of 2.72).  Assessment: 82 YOF on warfarin 0.'5mg'$  daily PTA for AF. INR low on admit. Holding now for possibility of PM placement on heparin gtt. Last HL therapeutic at 0.47. H&H remains low but stable at 8.8, plt low at 144. No s/s of bleed.  Goal of Therapy:  Heparin level 0.3-0.7 units/ml Monitor platelets by anticoagulation protocol: Yes   Plan:  Continue heparin gtt 1,250 units/hr Monitor daily HL, CBC, s/s of bleed F/U plans to restart warfarin  Elenor Quinones, PharmD, BCPS Clinical Pharmacist Pager [phone] 02/15/2015 8:05 AM

## 2015-02-15 NOTE — Progress Notes (Signed)
PT Cancellation Note  Patient Details Name: Alison Gross MRN: [id number] DOB: [birth year]   Cancelled Treatment:    Reason Eval/Treat Not Completed: Patient at procedure or test/unavailable Pt getting dialysis in the room. Will follow up next available time.   Marguarite Arbour A Xian Apostol 02/15/2015, 1:01 PM Wray Kearns, Hand, DPT [phone]

## 2015-02-16 DIAGNOSIS — I959 Hypotension, unspecified: Secondary | ICD-10-CM

## 2015-02-16 DIAGNOSIS — N08 Glomerular disorders in diseases classified elsewhere: Secondary | ICD-10-CM

## 2015-02-16 DIAGNOSIS — N289 Disorder of kidney and ureter, unspecified: Secondary | ICD-10-CM

## 2015-02-16 DIAGNOSIS — Z515 Encounter for palliative care: Secondary | ICD-10-CM | POA: Insufficient documentation

## 2015-02-16 DIAGNOSIS — J90 Pleural effusion, not elsewhere classified: Secondary | ICD-10-CM

## 2015-02-16 DIAGNOSIS — I5033 Acute on chronic diastolic (congestive) heart failure: Secondary | ICD-10-CM

## 2015-02-16 DIAGNOSIS — E854 Organ-limited amyloidosis: Secondary | ICD-10-CM

## 2015-02-16 MED ORDER — HYDROMORPHONE HCL 2 MG PO TABS
1.0000 mg | ORAL_TABLET | ORAL | Status: DC | PRN
  Administered 2015-02-16: 1 mg via ORAL
  Filled 2015-02-16: qty 1

## 2015-02-16 MED ORDER — MORPHINE SULFATE (CONCENTRATE) 10 MG/0.5ML PO SOLN: 20.0000 mg | ORAL | Status: DC | PRN

## 2015-02-16 MED ORDER — HYDROMORPHONE HCL 1 MG/ML PO LIQD: 0.5000 mg | ORAL | Status: DC | PRN

## 2015-02-16 NOTE — Progress Notes (Signed)
Pt to be transferred to [facility] per order. Report called to RN. Pt to be transferred via bed with personal belongings. Pts husband called and made aware of transfer.

## 2015-02-16 NOTE — Progress Notes (Signed)
I met with the patient's husband briefly today Explained that she could eat whatever she wanted Will remove central line  TRH, palliative, and cardiology services appreciated  Roselie Awkward, MD [facility] Pager: [phone] Cell: [phone] After 3pm or if no response, call [phone]

## 2015-02-16 NOTE — Progress Notes (Signed)
Subjective:  Resting comfortably in bed. No family in room during exam. No new concerns today.  Objective:  Vital Signs in the last 24 hours: Temp:  [97.8 F (36.6 C)-99 F (37.2 C)] 98.5 F (36.9 C) (12/09 1151) Pulse Rate:  [58-105] 73 (12/09 1400) Resp:  [12-34] 18 (12/09 1400) BP: (60-111)/(31-50) 81/40 mmHg (12/09 1200) SpO2:  [85 %-100 %] 100 % (12/09 1400) Weight:  [45.4 kg (100 lb 1.4 oz)] 45.4 kg (100 lb 1.4 oz) (12/09 0429)  Intake/Output from previous day: 12/08 0701 - 12/09 0700 In: 1610 [P.O.:980; I.V.:75] Out: 1500   Physical Exam: General appearance: alert, cooperative, appears stated age, no distress and appears wasted Eyes: negative findings: lids and lashes normal Neck: no carotid bruit, no JVD and supple, symmetrical, trachea midline Resp: decreased breath sounds bilaterally more than halfway at the bases. No wheeze.  Chest wall: no tenderness Cardio: S1, S2 normal, no S3 or S4 and systolic murmur: early systolic 9-6/0, crescendo at lower left sternal border, at apex GI: soft, non-tender; bowel sounds normal; no masses, no organomegaly Extremities: LE edema 2+, nontender. Full range of motion in extremities. Right upper extremity 3 plus edema (IV fluid infiltration)   Lab Results: BMP  Recent Labs  02/13/15 0410 02/14/15 0430 02/15/15 0420  NA 133* 132* 135  K 3.7 4.0 3.7  CL 99* 98* 100*  CO2 '29 28 30  '$ GLUCOSE 133* 163* 132*  BUN 16 24* 16  CREATININE 2.72* 3.73* 2.72*  CALCIUM 8.3* 8.4* 8.3*  GFRNONAA 15* 11* 15*  GFRAA 18* 12* 18*    CBC  Recent Labs Lab 02/15/15 0420  WBC 6.1  RBC 3.09*  HGB 8.8*  HCT 27.8*  PLT 144*  MCV 90.0  MCH 28.5  MCHC 31.7  RDW 17.2*  LYMPHSABS 0.8  MONOABS 0.6  EOSABS 0.4  BASOSABS 0.0    HEMOGLOBIN A1C Lab Results  Component Value Date   HGBA1C 6.2* 07/27/2014   MPG 131 07/27/2014    TSH  Recent Labs  07/27/14 0515 08/23/14 0822 01/01/15 1535  TSH 1.036 1.115 <0.010*     Recent  Labs  07/26/14 1825  08/24/14 1420  11/07/14 1206 01/01/15 1125  02/09/15 1634  02/13/15 0410 02/14/15 0430 02/15/15 0420  PROT 7.1  < > 6.1*  < > 6.6 5.5*  --  5.5*  --   --   --   --   ALBUMIN 3.5  < > 3.0*  < > 2.7* 2.6*  < > 2.2*  < > 2.0* 2.0* 1.8*  AST 38  < > 24  < > 37 28  --  27  --   --   --   --   ALT 33  < > 15  < > 35 30  --  27  --   --   --   --   ALKPHOS 85  < > 65  < > 83 93  --  143*  --   --   --   --   BILITOT 0.9  < > 0.6  < > 1.0 0.3  --  0.5  --   --   --   --   BILIDIR 0.2  --  <0.1*  --   --   --   --   --   --   --   --   --   IBILI 0.7  --  NOT CALCULATED  --   --   --   --   --   --   --   --   --   < > =  values in this interval not displayed.  Scheduled Meds: . feeding supplement (NEPRO CARB STEADY)  237 mL Oral TID BM  . midodrine  10 mg Oral TID WC  . senna-docusate  1 tablet Oral QHS   Continuous Infusions:   PRN Meds:.acetaminophen, albuterol, alteplase, HYDROcodone-acetaminophen, lidocaine (PF), lidocaine-prilocaine, sorbitol   Cardiac Studies: EKG 02/09/2015: Normal sinus rhythm, left axis deviation, left plantar physical block. Poor R progression, LVH with repolarization of normality, cannot exclude lateral ischemia.  Echocardiogram 01/02/2015: Left ventricle: The cavity size was normal. Wall thickness wasincreased in a pattern of moderate LVH. Systolic function wasnormal. The estimated ejection fraction was in the range of 60%to 65%. Wall motion was normal; there were no regional wallmotion abnormalities. - Aortic valve: There was mild regurgitation. Valve area (VTI):1.87 cm^2. Valve area (Vmax): 1.8 cm^2. Valve area (Vmean): 1.88 cm^2. - Mitral valve: Valve area by continuity equation (using LVOTflow): 1.76 cm^2. - Left atrium: The atrium was moderately dilated. - Right atrium: The atrium was moderately dilated. - Pulmonary arteries: Systolic pressure was severely increased. PA peak pressure: 72 mm Hg (S). - Pericardium,  extracardiac: A small, free-flowing pericardialeffusion was identified circumferential to the heart. There wasno evidence of hemodynamic compromise.  Echocardiogram 09/26/2014: LVEF 60-65% with grade 2 diastolic dysfunction, quadricuspid aortic valve, mild mitral regurgitation, severe left atrial enlargement. Lexiscan sestamibi stress test 09/25/2014: Perfusion: Small fixed defect at the inferior lateral wall on rest and perfusion images. No reversible ischemia identified. Wall Motion: Normal left ventricular wall motion. No left ventricular dilation. Left Ventricular Ejection Fraction: 62 %  Assessment/Plan:   1. Pleuritic left-sided chest pain, now resolved.  2. Junctional rhythm on admission, patient is presently normal sinus rhythm. Off digoxin. 3. Recurrent right pleural effusion, worse than the left pleural effusion, due to mediastinal mass/thyroid mass not a candidate for surgery. Has right Pleurx catheter in place 4. Paroxysmal atrial fibrillation 5. End-stage not disease on hemodialysis 6. Severe anemia and hypoproteinemia 7. History of breast cancer status post bilateral mastectomy 2014 8. Chronic diastolic heart failure, echocardiogram findings are suggestive of infiltrative cardiomyopathy with restrictive physiology 9. Severe pulmonary hypertension secondary to underlying mediastinal and thoracic issues. 10. Hyperthyroidism  Recommendation:  Pt resting comfortably. Reviewed palliative consult note.  Recommended "dilaudid liquid solution 0.5 mg every 3 hours prn for pain and respiratory distress at home". Pt prefers we continue management while hospitalized.  Will consult CMRN regarding Hospice at home and prepare for transition home.  Dr. Einar Gip has agreed to continue management after discharge along with Hospice.   Neldon Labella, NP-C 02/16/2015, 9:25 AM [facility] Pager: [phone] Office: [phone]

## 2015-02-16 NOTE — Progress Notes (Addendum)
[facility] PROGRESS NOTE    Progress Note   Alison Gross XLK:[id number] DOB: May 08, 1932 DOA: 02/09/2015 PCP: Maximino Greenland, MD   Brief Narrative:   Alison Gross is an 81 y.o. female past medical history of breast cancer stage renal disease on dialysis Monday was in Friday due to cardiorenal syndrome patient has a history of pleural effusion with a right Pleurx catheter in place, a bone marrow biopsy in 01/04/2015 and was compatible with MGUS  Assessment/Plan:   Recurrent right pleural effusion/pleurisy/chest pain/Hypotension: She responded poorly to IV fluid with poor response Had to be put on vasopressors for persistent hypotension. Then switch to midodrine. She has continued to drain her  effusion intermittently. Pulmonary thinks there is no role for left thoracocentesis unless asked the plan is to move towards comfort care. It was discussed with cardiology and nephrology and they agreed as a patient as a poor prognosis. She has multiorgan failure with severe refractory severe anemia, protein caloric malnutrition, chronic systolic and diastolic heart failure, end-stage renal disease on dialysis with episodes of hypotension while on dialysis. She has unfortunately not responded to aggressive medical management and continue to deteriorate slowly. It was discussed with the family and they wish to discontinue aggressive treatment. Family decided to move towards comfort care.  Symptomatic bradycardia/Paroxysmal atrial fibrillation [facility]): Her bradycardia resolved, her digoxin was stopped. 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  Elevated troponin in the setting of chronic renal disease.: Likely due to volume overload.  Paroxysmal atrial fibrillation:  Holding Coumadin.  Acute on  chronic diastolic heart failure (HCC) Seems to be euvolemic.   Malnutrition of moderate degree ([place]):   Pressure ulcer stage III (HCC)  MGUS: Dr. Jinny Blossom feels the patient is not a good candidate for treatment.  DVT Prophylaxis - Lovenox ordered.  Family Communication: none Disposition Plan: Home in am Code Status:     Code Status Orders        Start     Ordered   02/15/15 1350  Do not attempt resuscitation (DNR)   Continuous    Question Answer Comment  In the event of cardiac or respiratory ARREST Do not call a "code blue"   In the event of cardiac or respiratory ARREST Do not perform Intubation, CPR, defibrillation or ACLS   In the event of cardiac or respiratory ARREST Use medication by any route, position, wound care, and other measures to relive pain and suffering. May use oxygen, suction and manual treatment of airway obstruction as needed for comfort.   Comments Patient to be referred to Palliative Care.      02/15/15 1350    Advance Directive Documentation        Most Recent Value   Type of Advance Directive  Living will, Healthcare Power of Attorney   Pre-existing out of facility DNR order (yellow form or pink MOST form)     "MOST" Form in Place?          IV Access:    Peripheral IV   Procedures and diagnostic studies:   No results found.   Medical Consultants:    None.  Anti-Infectives:   Anti-infectives    None      Subjective:    Alison Gross  patient is currently comfortable she will like to go home for  the rest of the time she has left.  Objective:    Filed Vitals:   02/16/15 0429 02/16/15 0500 02/16/15 0600 02/16/15 0814  BP:  92/43 90/45   Pulse:  58 59   Temp:    98.6 F (37 C)  TempSrc:    Oral  Resp:  13 13   Height:      Weight: 45.4 kg (100 lb 1.4 oz)     SpO2:  100% 100%     Intake/Output Summary (Last 24 hours) at 02/16/15 0820 Last data filed at 02/15/15 2100  Gross per 24 hour  Intake 1292.5 ml    Output   1500 ml  Net -207.5 ml   Filed Weights   02/15/15 0415 02/15/15 1157 02/16/15 0429  Weight: 46 kg (101 lb 6.6 oz) 44.5 kg (98 lb 1.7 oz) 45.4 kg (100 lb 1.4 oz)    Exam: Gen:  NAD Cardiovascular:  RRR, No M/R/G Chest and lungs:   CTAB Abdomen:  Abdomen soft, NT/ND, + BS Extremities:  No C/E/C   Data Reviewed:    Labs: Basic Metabolic Panel:  Recent Labs Lab 02/10/15 0411 02/11/15 0522 02/12/15 0326 02/13/15 0410 02/14/15 0430 02/15/15 0420  NA 138 136 133* 133* 132* 135  K 4.5 4.5 4.7 3.7 4.0 3.7  CL 101 100* 98* 99* 98* 100*  CO2 _0 GLUCOSE 107* 131* 142* 133* 163* 132*  BUN 16 18 27* 16 24* 16  CREATININE 3.06* 3.13* 4.05* 2.72* 3.73* 2.72*  CALCIUM 8.4* 8.4* 8.4* 8.3* 8.4* 8.3*  MG 1.9 2.0 2.0 2.0 2.1  --   PHOS 4.3 4.7* 4.8* 3.9 4.1 3.2   GFR Estimated Creatinine Clearance: 11.8 mL/min (by C-G formula based on Cr of 2.72). Liver Function Tests:  Recent Labs Lab 02/09/15 1634 02/11/15 0522 02/12/15 0326 02/13/15 0410 02/14/15 0430 02/15/15 0420  AST 27  --   --   --   --   --   ALT 27  --   --   --   --   --   ALKPHOS 143*  --   --   --   --   --   BILITOT 0.5  --   --   --   --   --   PROT 5.5*  --   --   --   --   --   ALBUMIN 2.2* 1.9* 1.9* 2.0* 2.0* 1.8*    Recent Labs Lab 02/10/15 0440  LIPASE 24   No results for input(s): AMMONIA in the last 168 hours. Coagulation profile  Recent Labs Lab 02/09/15 1634 02/10/15 0411 02/12/15 0327  INR 1.10 1.06 1.14    CBC:  Recent Labs Lab 02/09/15 1634  02/11/15 0522 02/12/15 0326 02/13/15 0410 02/14/15 0430 02/15/15 0420  WBC 7.8  < > 8.7 8.2 6.0 6.7 6.1  NEUTROABS 5.5  --   --   --   --   --  4.2  HGB 9.8*  < > 9.1* 8.9* 8.5* 8.8* 8.8*  HCT 31.4*  < > 29.5* 27.5* 27.4* 28.2* 27.8*  MCV 89.0  < > 89.9 89.9 89.3 89.2 90.0  PLT 195  < > 189 205 168 199 144*  < > = values in this interval not displayed. Cardiac Enzymes: No results for input(s): CKTOTAL,  CKMB, CKMBINDEX, TROPONINI in the last 168 hours. BNP (last 3 results) No results for input(s): PROBNP in the last 8760 hours. CBG:  Recent  Labs Lab 02/09/15 2152 02/10/15 0219  GLUCAP 93 98   D-Dimer: No results for input(s): DDIMER in the last 72 hours. Hgb A1c: No results for input(s): HGBA1C in the last 72 hours. Lipid Profile: No results for input(s): CHOL, HDL, LDLCALC, TRIG, CHOLHDL, LDLDIRECT in the last 72 hours. Thyroid function studies: No results for input(s): TSH, T4TOTAL, T3FREE, THYROIDAB in the last 72 hours.  Invalid input(s): FREET3 Anemia work up: No results for input(s): VITAMINB12, FOLATE, FERRITIN, TIBC, IRON, RETICCTPCT in the last 72 hours. Sepsis Labs:  Recent Labs Lab 02/10/15 0423  02/12/15 0326 02/13/15 0410 02/14/15 0430 02/15/15 0420  WBC  --   < > 8.2 6.0 6.7 6.1  LATICACIDVEN 1.4  --   --   --   --   --   < > = values in this interval not displayed. Microbiology Recent Results (from the past 240 hour(s))  MRSA PCR Screening     Status: None   Collection Time: 02/10/15  2:20 AM  Result Value Ref Range Status   MRSA by PCR NEGATIVE NEGATIVE Final    Comment:        The GeneXpert MRSA Assay (FDA approved for NASAL specimens only), is one component of a comprehensive MRSA colonization surveillance program. It is not intended to diagnose MRSA infection nor to guide or monitor treatment for MRSA infections.   Blood culture (routine x 2)     Status: None   Collection Time: 02/10/15  2:50 AM  Result Value Ref Range Status   Specimen Description BLOOD RIGHT FOREARM  Final   Special Requests BOTTLES DRAWN AEROBIC AND ANAEROBIC 10CC  Final   Culture NO GROWTH 5 DAYS  Final   Report Status 02/15/2015 FINAL  Final  Culture, blood (routine x 2)     Status: None   Collection Time: 02/10/15  3:35 AM  Result Value Ref Range Status   Specimen Description BLOOD CENTRAL LINE  Final   Special Requests BOTTLES DRAWN AEROBIC AND ANAEROBIC 10CC   Final   Culture NO GROWTH 5 DAYS  Final   Report Status 02/15/2015 FINAL  Final     Medications:   . antiseptic oral rinse  7 mL Mouth Rinse BID  . feeding supplement (NEPRO CARB STEADY)  237 mL Oral TID BM  . midodrine  10 mg Oral TID WC  . multivitamin with minerals  1 tablet Oral QODAY  . senna-docusate  1 tablet Oral QHS  . sodium chloride  500 mL Intravenous Once  . thiamine  100 mg Oral Daily   Continuous Infusions:   Time spent: 15 min   LOS: 6 days   Charlynne Cousins  [facility] Pager [phone]  *Please refer to [email or web address], password [email or web address] to get updated schedule on who will round on this patient, as hospitalists switch teams weekly. If 7PM-7AM, please contact night-coverage at [email or web address], password [email or web address] for any overnight needs.  02/16/2015, 8:20 AM

## 2015-02-16 NOTE — Progress Notes (Signed)
I came to follow up with Ms. Criss today. Still looks very alert and sitting up in bed looking on her phone when I come by. We discuss what her plans are and she confirms to go home but says that Dr. Einar Gip said in 3-4 days. She does not appear interested in discussing this with me and I know she trusts Dr. Irven Shelling opinion for her. Still no family at bedside. Recommend to make sure family can manage care for her at home because she will need them there even with hospice as they are not in the home but ~1-2 hours per day. Also recommend dilaudid liquid solution 0.5 mg every 3 hours prn for pain and respiratory distress at home. Please call for any further palliative needs otherwise home hospice can be arranged with CMRN when appropriate to patient and family.   Vinie Sill, NP Palliative Medicine Team Pager # [phone] (M-F 8a-5p) Team Phone # [phone] (Nights/Weekends)

## 2015-02-17 MED ORDER — HYDROMORPHONE HCL 1 MG/ML PO LIQD
1.0000 mg | ORAL | Status: DC | PRN
Start: 2015-02-17 — End: 2015-02-17

## 2015-02-17 MED ORDER — HYDROMORPHONE HCL 1 MG/ML IJ SOLN: 0.5000 mg | INTRAMUSCULAR | Status: DC | PRN

## 2015-02-17 MED ORDER — ALBUTEROL SULFATE (2.5 MG/3ML) 0.083% IN NEBU: 3.0000 mL | INHALATION_SOLUTION | RESPIRATORY_TRACT | Status: DC | PRN

## 2015-02-17 MED ORDER — MORPHINE SULFATE (CONCENTRATE) 10 MG/0.5ML PO SOLN: 10.0000 mg | ORAL | Status: DC | PRN

## 2015-02-17 MED ORDER — ALBUTEROL SULFATE (2.5 MG/3ML) 0.083% IN NEBU
2.5000 mg | INHALATION_SOLUTION | RESPIRATORY_TRACT | Status: DC | PRN
  Administered 2015-02-17: 2.5 mg via RESPIRATORY_TRACT
  Filled 2015-02-17: qty 3

## 2015-02-17 NOTE — Progress Notes (Signed)
Subjective:  Resting comfortably in bed. Husband at bedside. No new concerns today.  Objective:  Vital Signs in the last 24 hours: Temp:  [98.5 F (36.9 C)-100.1 F (37.8 C)] 100.1 F (37.8 C) (12/09 2126) Pulse Rate:  [70-82] 82 (12/09 2126) Resp:  [16-25] 16 (12/09 2126) BP: (81-110)/(40-51) 110/51 mmHg (12/09 2126) SpO2:  [96 %-100 %] 96 % (12/09 2126)  Intake/Output from previous day: 12/09 0701 - 12/10 0700 In: 320 [P.O.:320] Out: -   Physical Exam: General appearance: alert, cooperative, appears stated age, no distress and appears wasted Eyes: negative findings: lids and lashes normal Neck: no carotid bruit, no JVD and supple, symmetrical, trachea midline Resp: decreased breath sounds bilaterally more than halfway at the bases. No wheeze.  Chest wall: no tenderness Cardio: S1, S2 normal, no S3 or S4 and systolic murmur: early systolic 2-7/0, crescendo at lower left sternal border, at apex GI: soft, non-tender; bowel sounds normal; no masses, no organomegaly Extremities: LE no  Edema. Full range of motion in extremities.    Lab Results: BMP  Recent Labs  02/13/15 0410 02/14/15 0430 02/15/15 0420  NA 133* 132* 135  K 3.7 4.0 3.7  CL 99* 98* 100*  CO2 '29 28 30  '$ GLUCOSE 133* 163* 132*  BUN 16 24* 16  CREATININE 2.72* 3.73* 2.72*  CALCIUM 8.3* 8.4* 8.3*  GFRNONAA 15* 11* 15*  GFRAA 18* 12* 18*    CBC  Recent Labs Lab 02/15/15 0420  WBC 6.1  RBC 3.09*  HGB 8.8*  HCT 27.8*  PLT 144*  MCV 90.0  MCH 28.5  MCHC 31.7  RDW 17.2*  LYMPHSABS 0.8  MONOABS 0.6  EOSABS 0.4  BASOSABS 0.0    HEMOGLOBIN A1C Lab Results  Component Value Date   HGBA1C 6.2* 07/27/2014   MPG 131 07/27/2014    TSH  Recent Labs  07/27/14 0515 08/23/14 0822 01/01/15 1535  TSH 1.036 1.115 <0.010*     Recent Labs  07/26/14 1825  08/24/14 1420  11/07/14 1206 01/01/15 1125  02/09/15 1634  02/13/15 0410 02/14/15 0430 02/15/15 0420  PROT 7.1  < > 6.1*  < > 6.6  5.5*  --  5.5*  --   --   --   --   ALBUMIN 3.5  < > 3.0*  < > 2.7* 2.6*  < > 2.2*  < > 2.0* 2.0* 1.8*  AST 38  < > 24  < > 37 28  --  27  --   --   --   --   ALT 33  < > 15  < > 35 30  --  27  --   --   --   --   ALKPHOS 85  < > 65  < > 83 93  --  143*  --   --   --   --   BILITOT 0.9  < > 0.6  < > 1.0 0.3  --  0.5  --   --   --   --   BILIDIR 0.2  --  <0.1*  --   --   --   --   --   --   --   --   --   IBILI 0.7  --  NOT CALCULATED  --   --   --   --   --   --   --   --   --   < > = values in this interval not displayed.  Scheduled Meds: . feeding supplement (NEPRO CARB STEADY)  237 mL Oral TID BM  . midodrine  10 mg Oral TID WC  . senna-docusate  1 tablet Oral QHS   Continuous Infusions:   PRN Meds:.acetaminophen, albuterol, HYDROcodone-acetaminophen, HYDROmorphone, sorbitol   Cardiac Studies: EKG 02/09/2015: Normal sinus rhythm, left axis deviation, left plantar physical block. Poor R progression, LVH with repolarization of normality, cannot exclude lateral ischemia.  Echocardiogram 01/02/2015: Left ventricle: The cavity size was normal. Wall thickness wasincreased in a pattern of moderate LVH. Systolic function wasnormal. The estimated ejection fraction was in the range of 60%to 65%. Wall motion was normal; there were no regional wallmotion abnormalities. - Aortic valve: There was mild regurgitation. Valve area (VTI):1.87 cm^2. Valve area (Vmax): 1.8 cm^2. Valve area (Vmean): 1.88 cm^2. - Mitral valve: Valve area by continuity equation (using LVOTflow): 1.76 cm^2. - Left atrium: The atrium was moderately dilated. - Right atrium: The atrium was moderately dilated. - Pulmonary arteries: Systolic pressure was severely increased. PA peak pressure: 72 mm Hg (S). - Pericardium, extracardiac: A small, free-flowing pericardialeffusion was identified circumferential to the heart. There wasno evidence of hemodynamic compromise.  Echocardiogram 09/26/2014: LVEF 60-65% with  grade 2 diastolic dysfunction, quadricuspid aortic valve, mild mitral regurgitation, severe left atrial enlargement. Lexiscan sestamibi stress test 09/25/2014: Perfusion: Small fixed defect at the inferior lateral wall on rest and perfusion images. No reversible ischemia identified. Wall Motion: Normal left ventricular wall motion. No left ventricular dilation. Left Ventricular Ejection Fraction: 62 %  Assessment/Plan:   1. Pleuritic left-sided chest pain, now resolved.  2. Junctional rhythm on admission, patient is presently normal sinus rhythm. Off digoxin. 3. Recurrent right pleural effusion, worse than the left pleural effusion, due to mediastinal mass/thyroid mass not a candidate for surgery. Has right Pleurx catheter in place 4. Paroxysmal atrial fibrillation 5. End-stage not disease on hemodialysis 6. Severe anemia and hypoproteinemia 7. History of breast cancer status post bilateral mastectomy 2014 8. Chronic diastolic heart failure, echocardiogram findings are suggestive of infiltrative cardiomyopathy with restrictive physiology 9. Severe pulmonary hypertension secondary to underlying mediastinal and thoracic issues. 10. Hyperthyroidism  Recommendation:  Pt resting comfortably. Reviewed palliative consult note.  Recommended "dilaudid liquid solution 0.5 mg every 3 hours prn for pain and respiratory distress at home". Pt prefers we continue management while hospitalized.  Will consult CMRN regarding Hospice at home and prepare for transition home.  I will continue management after discharge along with Hospice. Home when Hospice arranged and all plans are in place. Will also order Albuterol inhaler for dyspnea.  Adrian Prows, MD 02/17/2015, 10:36 AM [facility]. [place] Pager: [phone] Office: [phone] If no answer: Cell:  [phone]

## 2015-02-17 NOTE — Progress Notes (Signed)
Spoke with Wanda,CM. Aware of order to arrange home hospice. Anticipate d/c 12/11.

## 2015-02-17 NOTE — Care Management (Addendum)
CM met with patient and Daughter Alison Gross [phone] to discuss [place], they are agreeable  Referral made to [facility] per family request . Await followup for [facility] RN rep to plan home services. Family informed that referral was faxed to [place]. Spoke with Mare Ferrari of [facility] earlier cannot admit this weekend at the earliest Monday. Made patient aware. [facility] liaison will contact family.  Family is requesting that Liaison speak with the family at time that most family can be present.

## 2015-02-17 NOTE — Progress Notes (Signed)
[facility] PROGRESS NOTE    Progress Note   Alison Gross JEH:[id number] DOB: [birth year] DOA: 02/09/2015 PCP: Maximino Greenland, MD   Brief Narrative:   Alison Gross is an 81 y.o. female past medical history of breast cancer stage renal disease on dialysis Monday was in Friday due to cardiorenal syndrome patient has a history of pleural effusion with a right Pleurx catheter in place, a bone marrow biopsy in 01/04/2015 and was compatible with MGUS  Assessment/Plan:   Recurrent right pleural effusion/pleurisy/chest pain/Hypotension: Will use liq morphine. Family decided to move towards comfort care. Social worker consult for hospice at home  Symptomatic bradycardia/Paroxysmal atrial fibrillation [facility]): Her bradycardia resolved, her digoxin was stopped. 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  Elevated troponin in the setting of chronic renal disease.: Likely due to volume overload.  Paroxysmal atrial fibrillation:  Holding Coumadin.  Acute on chronic diastolic heart failure (HCC) Seems to be fluid overloaded.   Malnutrition of moderate degree ([place]):   Pressure ulcer stage III (HCC)  MGUS: Dr. Alecia Lemming feels the patient is not a good candidate for treatment.  DVT Prophylaxis - Lovenox ordered.  Family Communication: none Disposition Plan: Home in am Code Status:     Code Status Orders        Start     Ordered   02/15/15 1350  Do not attempt resuscitation (DNR)   Continuous    Question Answer Comment  In the event of cardiac or respiratory ARREST Do not call a "code blue"   In the event of cardiac or respiratory ARREST Do not perform Intubation, CPR, defibrillation or ACLS   In the event of cardiac or respiratory ARREST Use medication by any route,  position, wound care, and other measures to relive pain and suffering. May use oxygen, suction and manual treatment of airway obstruction as needed for comfort.   Comments Patient to be referred to Palliative Care.      02/15/15 1350    Advance Directive Documentation        Most Recent Value   Type of Advance Directive  Living will, Healthcare Power of Attorney   Pre-existing out of facility DNR order (yellow form or pink MOST form)     "MOST" Form in Place?          IV Access:    Peripheral IV   Procedures and diagnostic studies:   No results found.   Medical Consultants:    None.  Anti-Infectives:   Anti-infectives    None      Subjective:    Alison Gross  SOB at night  Objective:    Filed Vitals:   02/16/15 1200 02/16/15 1300 02/16/15 1400 02/16/15 2126  BP: 81/40  96/47 110/51  Pulse: 70 75 73 82  Temp:    100.1 F (37.8 C)  TempSrc:    Oral  Resp: _0 Height:      Weight:      SpO2: 100% 100% 100% 96%    Intake/Output Summary (Last 24 hours) at 02/17/15 1141 Last data filed at 02/17/15 0903  Gross per 24 hour  Intake    340 ml  Output      0 ml  Net    340 ml   Filed Weights   02/15/15  0415 02/15/15 1157 02/16/15 0429  Weight: 46 kg (101 lb 6.6 oz) 44.5 kg (98 lb 1.7 oz) 45.4 kg (100 lb 1.4 oz)    Exam: Gen:  NAD Cardiovascular:  RRR, +JVD Chest and lungs:   Clear to auscultation Abdomen:  Abdomen soft, NT/ND, + BS Extremities:  No edema   Data Reviewed:    Labs: Basic Metabolic Panel:  Recent Labs Lab 02/11/15 0522 02/12/15 0326 02/13/15 0410 02/14/15 0430 02/15/15 0420  NA 136 133* 133* 132* 135  K 4.5 4.7 3.7 4.0 3.7  CL 100* 98* 99* 98* 100*  CO2 27 28 29 28 30  GLUCOSE 131* 142* 133* 163* 132*  BUN 18 27* 16 24* 16  CREATININE 3.13* 4.05* 2.72* 3.73* 2.72*  CALCIUM 8.4* 8.4* 8.3* 8.4* 8.3*  MG 2.0 2.0 2.0 2.1  --   PHOS 4.7* 4.8* 3.9 4.1 3.2   GFR Estimated Creatinine Clearance: 11.8 mL/min  (by C-G formula based on Cr of 2.72). Liver Function Tests:  Recent Labs Lab 02/11/15 0522 02/12/15 0326 02/13/15 0410 02/14/15 0430 02/15/15 0420  ALBUMIN 1.9* 1.9* 2.0* 2.0* 1.8*   No results for input(s): LIPASE, AMYLASE in the last 168 hours. No results for input(s): AMMONIA in the last 168 hours. Coagulation profile  Recent Labs Lab 02/12/15 0327  INR 1.14    CBC:  Recent Labs Lab 02/11/15 0522 02/12/15 0326 02/13/15 0410 02/14/15 0430 02/15/15 0420  WBC 8.7 8.2 6.0 6.7 6.1  NEUTROABS  --   --   --   --  4.2  HGB 9.1* 8.9* 8.5* 8.8* 8.8*  HCT 29.5* 27.5* 27.4* 28.2* 27.8*  MCV 89.9 89.9 89.3 89.2 90.0  PLT 189 205 168 199 144*   Cardiac Enzymes: No results for input(s): CKTOTAL, CKMB, CKMBINDEX, TROPONINI in the last 168 hours. BNP (last 3 results) No results for input(s): PROBNP in the last 8760 hours. CBG: No results for input(s): GLUCAP in the last 168 hours. D-Dimer: No results for input(s): DDIMER in the last 72 hours. Hgb A1c: No results for input(s): HGBA1C in the last 72 hours. Lipid Profile: No results for input(s): CHOL, HDL, LDLCALC, TRIG, CHOLHDL, LDLDIRECT in the last 72 hours. Thyroid function studies: No results for input(s): TSH, T4TOTAL, T3FREE, THYROIDAB in the last 72 hours.  Invalid input(s): FREET3 Anemia work up: No results for input(s): VITAMINB12, FOLATE, FERRITIN, TIBC, IRON, RETICCTPCT in the last 72 hours. Sepsis Labs:  Recent Labs Lab 02/12/15 0326 02/13/15 0410 02/14/15 0430 02/15/15 0420  WBC 8.2 6.0 6.7 6.1   Microbiology Recent Results (from the past 240 hour(s))  MRSA PCR Screening     Status: None   Collection Time: 02/10/15  2:20 AM  Result Value Ref Range Status   MRSA by PCR NEGATIVE NEGATIVE Final    Comment:        The GeneXpert MRSA Assay (FDA approved for NASAL specimens only), is one component of a comprehensive MRSA colonization surveillance program. It is not intended to diagnose  MRSA infection nor to guide or monitor treatment for MRSA infections.   Blood culture (routine x 2)     Status: None   Collection Time: 02/10/15  2:50 AM  Result Value Ref Range Status   Specimen Description BLOOD RIGHT FOREARM  Final   Special Requests BOTTLES DRAWN AEROBIC AND ANAEROBIC 10CC  Final   Culture NO GROWTH 5 DAYS  Final   Report Status 02/15/2015 FINAL  Final  Culture, blood (routine x 2)       Status: None   Collection Time: 02/10/15  3:35 AM  Result Value Ref Range Status   Specimen Description BLOOD CENTRAL LINE  Final   Special Requests BOTTLES DRAWN AEROBIC AND ANAEROBIC 10CC  Final   Culture NO GROWTH 5 DAYS  Final   Report Status 02/15/2015 FINAL  Final     Medications:   . feeding supplement (NEPRO CARB STEADY)  237 mL Oral TID BM  . midodrine  10 mg Oral TID WC  . senna-docusate  1 tablet Oral QHS   Continuous Infusions:   Time spent: 15 min   LOS: 7 days   Charlynne Cousins  [facility] Pager [phone]  *Please refer to [email or web address], password [email or web address] to get updated schedule on who will round on this patient, as hospitalists switch teams weekly. If 7PM-7AM, please contact night-coverage at [email or web address], password [email or web address] for any overnight needs.  02/17/2015, 11:41 AM

## 2015-02-18 NOTE — Progress Notes (Addendum)
[facility] PROGRESS NOTE    Progress Note   Alison Gross:[id number] DOB: December 23, 1932 DOA: 02/09/2015 PCP: Maximino Greenland, MD   Brief Narrative:   Alison Gross is an 81 y.o. female past medical history of breast cancer stage renal disease on dialysis Monday was in Friday due to cardiorenal syndrome patient has a history of pleural effusion with a right Pleurx catheter in place, a bone marrow biopsy in 01/04/2015 and was compatible with MGUS  Assessment/Plan:   Recurrent right pleural effusion/pleurisy/chest pain/Hypotension: Has not require roxanol. Family decided to move towards comfort care. Awaiting Hospice referral for home.  Symptomatic bradycardia/Paroxysmal atrial fibrillation [facility]): Her bradycardia resolved, her digoxin was stopped. 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 She is currently mentating well.  Elevated troponin in the setting of chronic renal disease.: Likely due to volume overload.  Paroxysmal atrial fibrillation:  Holding Coumadin.  Acute on chronic diastolic heart failure (HCC) Seems to be fluid overloaded.   Malnutrition of moderate degree ([place]): Pressure ulcer stage III (HCC) MGUS: Dr. Alecia Lemming feels the patient is not a good candidate for treatment.  DVT Prophylaxis - Lovenox ordered.  Family Communication: none Disposition Plan: Home in am Code Status:     Code Status Orders        Start     Ordered   02/15/15 1350  Do not attempt resuscitation (DNR)   Continuous    Question Answer Comment  In the event of cardiac or respiratory ARREST Do not call a "code blue"   In the event of cardiac or respiratory ARREST Do not perform Intubation, CPR, defibrillation or ACLS   In the event of cardiac or respiratory ARREST Use  medication by any route, position, wound care, and other measures to relive pain and suffering. May use oxygen, suction and manual treatment of airway obstruction as needed for comfort.   Comments Patient to be referred to Palliative Care.      02/15/15 1350    Advance Directive Documentation        Most Recent Value   Type of Advance Directive  Living will, Healthcare Power of Attorney   Pre-existing out of facility DNR order (yellow form or pink MOST form)     "MOST" Form in Place?          IV Access:    Peripheral IV   Procedures and diagnostic studies:   No results found.   Medical Consultants:    None.  Anti-Infectives:   Anti-infectives    None      Subjective:    Alison Gross  No SOB overnight.  Objective:    Filed Vitals:   02/16/15 1400 02/16/15 2126 02/17/15 1213 02/17/15 2108  BP: 96/47 110/51  113/46  Pulse: 73 82  72  Temp:  100.1 F (37.8 C)  97.7 F (36.5 C)  TempSrc:  Oral  Oral  Resp: _0 Height:      Weight:      SpO2: 100% 96% 94% 100%    Intake/Output Summary (Last 24 hours) at 02/18/15 1142 Last data filed at 02/18/15 0958  Gross per 24 hour  Intake    240 ml  Output      0 ml  Net    240 ml   [employer]  02/15/15 0415 02/15/15 1157 02/16/15 0429  Weight: 46 kg (101 lb 6.6 oz) 44.5 kg (98 lb 1.7 oz) 45.4 kg (100 lb 1.4 oz)    Exam: Gen:  NAD Cardiovascular:  RRR, +JVD Chest and lungs:   Clear to auscultation, good air movement CTA B/L Abdomen:  Abdomen soft, NT/ND, + BS Extremities:  No edema   Data Reviewed:    Labs: Basic Metabolic Panel:  Recent Labs Lab 02/12/15 0326 02/13/15 0410 02/14/15 0430 02/15/15 0420  NA 133* 133* 132* 135  K 4.7 3.7 4.0 3.7  CL 98* 99* 98* 100*  CO2 _0 GLUCOSE 142* 133* 163* 132*  BUN 27* 16 24* 16  CREATININE 4.05* 2.72* 3.73* 2.72*  CALCIUM 8.4* 8.3* 8.4* 8.3*  MG 2.0 2.0 2.1  --   PHOS 4.8* 3.9 4.1 3.2   GFR Estimated Creatinine  Clearance: 11.8 mL/min (by C-G formula based on Cr of 2.72). Liver Function Tests:  Recent Labs Lab 02/12/15 0326 02/13/15 0410 02/14/15 0430 02/15/15 0420  ALBUMIN 1.9* 2.0* 2.0* 1.8*   No results for input(s): LIPASE, AMYLASE in the last 168 hours. No results for input(s): AMMONIA in the last 168 hours. Coagulation profile  Recent Labs Lab 02/12/15 0327  INR 1.14    CBC:  Recent Labs Lab 02/12/15 0326 02/13/15 0410 02/14/15 0430 02/15/15 0420  WBC 8.2 6.0 6.7 6.1  NEUTROABS  --   --   --  4.2  HGB 8.9* 8.5* 8.8* 8.8*  HCT 27.5* 27.4* 28.2* 27.8*  MCV 89.9 89.3 89.2 90.0  PLT 205 168 199 144*   Cardiac Enzymes: No results for input(s): CKTOTAL, CKMB, CKMBINDEX, TROPONINI in the last 168 hours. BNP (last 3 results) No results for input(s): PROBNP in the last 8760 hours. CBG: No results for input(s): GLUCAP in the last 168 hours. D-Dimer: No results for input(s): DDIMER in the last 72 hours. Hgb A1c: No results for input(s): HGBA1C in the last 72 hours. Lipid Profile: No results for input(s): CHOL, HDL, LDLCALC, TRIG, CHOLHDL, LDLDIRECT in the last 72 hours. Thyroid function studies: No results for input(s): TSH, T4TOTAL, T3FREE, THYROIDAB in the last 72 hours.  Invalid input(s): FREET3 Anemia work up: No results for input(s): VITAMINB12, FOLATE, FERRITIN, TIBC, IRON, RETICCTPCT in the last 72 hours. Sepsis Labs:  Recent Labs Lab 02/12/15 0326 02/13/15 0410 02/14/15 0430 02/15/15 0420  WBC 8.2 6.0 6.7 6.1   Microbiology Recent Results (from the past 240 hour(s))  MRSA PCR Screening     Status: None   Collection Time: 02/10/15  2:20 AM  Result Value Ref Range Status   MRSA by PCR NEGATIVE NEGATIVE Final    Comment:        The GeneXpert MRSA Assay (FDA approved for NASAL specimens only), is one component of a comprehensive MRSA colonization surveillance program. It is not intended to diagnose MRSA infection nor to guide or monitor treatment  for MRSA infections.   Blood culture (routine x 2)     Status: None   Collection Time: 02/10/15  2:50 AM  Result Value Ref Range Status   Specimen Description BLOOD RIGHT FOREARM  Final   Special Requests BOTTLES DRAWN AEROBIC AND ANAEROBIC 10CC  Final   Culture NO GROWTH 5 DAYS  Final   Report Status 02/15/2015 FINAL  Final  Culture, blood (routine x 2)     Status: None   Collection Time: 02/10/15  3:35 AM  Result Value Ref Range Status  Specimen Description BLOOD CENTRAL LINE  Final   Special Requests BOTTLES DRAWN AEROBIC AND ANAEROBIC 10CC  Final   Culture NO GROWTH 5 DAYS  Final   Report Status 02/15/2015 FINAL  Final     Medications:   . feeding supplement (NEPRO CARB STEADY)  237 mL Oral TID BM  . midodrine  10 mg Oral TID WC  . senna-docusate  1 tablet Oral QHS   Continuous Infusions:   Time spent: 15 min   LOS: 8 days   Charlynne Cousins  [facility] Pager [phone]  *Please refer to [email or web address], password [email or web address] to get updated schedule on who will round on this patient, as hospitalists switch teams weekly. If 7PM-7AM, please contact night-coverage at [email or web address], password [email or web address] for any overnight needs.  02/18/2015, 11:42 AM

## 2015-02-18 NOTE — Care Management (Addendum)
CM received call from Ava at [place] [facility], received referral and will follow up with family. CM updated family, [facility] Liaison will meet with family at  3pm, family is agreeable.

## 2015-02-19 ENCOUNTER — Encounter: Payer: Self-pay | Admitting: *Deleted

## 2015-02-19 ENCOUNTER — Other Ambulatory Visit: Payer: Self-pay | Admitting: *Deleted

## 2015-02-19 MED ORDER — MORPHINE SULFATE (CONCENTRATE) 10 MG/0.5ML PO SOLN: 10.0000 mg | ORAL | Status: AC | PRN

## 2015-02-19 NOTE — Discharge Summary (Signed)
Physician Discharge Summary  Alison Gross NOB:[id number] DOB: 29-Jul-1932 DOA: 02/09/2015  PCP: Maximino Greenland, MD  Admit date: 02/09/2015 Discharge date: 02/19/2015  Time spent: 35 minutes  Recommendations for Outpatient Follow-up:  1. Will go home with hospice.   Discharge Diagnoses:  Active Problems:   Recurrent right pleural effusion   Elevated troponin   Acute on chronic diastolic heart failure (HCC)   Paroxysmal atrial fibrillation (HCC)   Malnutrition of moderate degree (HCC)   Hypotension   ESRD (end stage renal disease) ([place])   Pressure ulcer stage III ([place])   Amyloid kidney ([place])   Pleurisy with effusion   Palliative care encounter   Discharge Condition: poor  Diet recommendation: comfort feeds  Filed Weights   02/15/15 0415 02/15/15 1157 02/16/15 0429  Weight: 46 kg (101 lb 6.6 oz) 44.5 kg (98 lb 1.7 oz) 45.4 kg (100 lb 1.4 oz)    History of present illness:  81 y.o. female with h/o BRCA (in remission), ESRD dialysis MWF due to cardio-renal syndrome. Patient has h/o B pleural effusions, R pleurex catheter in place, and was drained in the office today by Dr. Servando Snare. He did note however on CXR that patients L sided pleural efusion has grown significantly since 1 month ago. Prior to dialysis today she had been asymptomatic with regards to the L pleural effusion.  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 She responded poorly to IV fluid, due to persistent hypotension she had to be placed on vasopressors which wouldn't  switch to midodrine, her effusions were drained intermittently, pulmonary related was notable for left thoracocentesis. I will discuss with cardiology nephrology and pulmonary critical care that the [place] before. She has multiorgan failure with severe refractory anemia on top of all her other chronic comorbidities, and developing hypotension monitor treatment during dialysis. After long discussion with the family family decided to move towards comfort care.  Procedures:  CT chest  Consultations:  Nephrology  Cardiology  PCCM  Discharge Exam: Filed Vitals:   02/19/15 0558 02/19/15 0834  BP: 105/44 113/49  Pulse: 61 62  Temp: 97.8 F (36.6 C) 97.9 F (36.6 C)  Resp: 16 16    General: A&O x3 Cardiovascular: RRR Respiratory: good air movement CTA B/L  Discharge Instructions   Discharge Instructions    Diet - low sodium heart healthy    Complete by:  As directed      Increase activity slowly    Complete by:  As directed           Current Discharge Medication List    START taking these medications   Details  Morphine Sulfate (MORPHINE CONCENTRATE) 10 MG/0.5ML SOLN concentrated solution Take 0.5 mLs (10 mg total) by mouth every 2 (two) hours as needed for moderate pain, severe pain or shortness of breath. Qty: 42 mL, Refills: 0      CONTINUE these medications which have NOT CHANGED   Details  acetaminophen (TYLENOL) 325 MG tablet Take 2 tablets (650 mg total) by mouth every 4 (four) hours as needed for headache or mild pain.      STOP taking these medications     anastrozole (ARIMIDEX) 1 MG tablet  digoxin (LANOXIN) 0.125 MG tablet      methimazole (TAPAZOLE) 10 MG tablet      midodrine (PROAMATINE) 10 MG tablet      Multiple Vitamin (MULTIVITAMIN WITH MINERALS) TABS      senna-docusate (SENOKOT-S) 8.6-50 MG tablet      thiamine 100 MG tablet      VENTOLIN HFA 108 (90 BASE) MCG/ACT inhaler      warfarin (COUMADIN) 0.5 mg TABS tablet        No  Known Allergies Follow-up Information    Follow up with Maximino Greenland, MD In 2 weeks.   Specialty:  Internal Medicine   Why:  hospital follow up   Contact information:   [address] [place] [address] [phone]        The results of significant diagnostics from this hospitalization (including imaging, microbiology, ancillary and laboratory) are listed below for reference.    Significant Diagnostic Studies: Dg Chest 2 View  02/09/2015  CLINICAL DATA:  Chest pain, shortness of breath. EXAM: CHEST  2 VIEW COMPARISON:  February 08, 2015. FINDINGS: Stable presence of large right peritracheal mass consistent with thyroid goiter. Stable moderate left pleural effusion is noted with probable underlying atelectasis. No pneumothorax is noted. Right-sided dialysis catheter is unchanged in position, with distal tips in expected position of the SVC. Right axillary surgical clips are noted. Mild right pleural effusion is noted. Bony thorax is unremarkable. Stable position of right-sided pleural drainage catheter is noted. IMPRESSION: Stable bilateral pleural effusions are noted, with left greater than right. Stable position of right-sided pleural drainage catheter, as well as right-sided dialysis catheter. No significant changes noted compared to prior exam. Electronically Signed   By: Marijo Conception, M.D.   On: 02/09/2015 16:06   Dg Chest 2 View  02/08/2015  CLINICAL DATA:  History of right-sided pleural effusion, experienced some shortness of breath ; history of diabetes, monoclonal gammopathy, DCIS of the left breast, goiter, CHF, acute and chronic renal failure. EXAM: CHEST  2 VIEW COMPARISON:  Portable chest x-ray of January 05, 2015 FINDINGS: The right lung is well-expanded. The right-sided small caliber chest tube is unchanged in position. There is a small right pleural effusion. There is no pneumothorax. A large right paratracheal mass deviating the trachea toward the left  persists consistent with the known goiter. A dual-lumen catheter via the right internal jugular approach is unchanged in position. On the left the volume of pleural fluid has increased and is now moderate in size. This partially obscures the left heart border. The heart is not enlarged. The pulmonary vascularity is not engorged. IMPRESSION: Increased volume of pleural fluid on the left since the previous study. There is no alveolar pneumonia nor pulmonary edema. Stable small right pleural effusion and small caliber right-sided chest tube. Electronically Signed   By: David  Martinique M.D.   On: 02/08/2015 13:50   Ct Chest Wo Contrast  02/12/2015  CLINICAL DATA:  Left-sided chest pain and dyspnea preceding initiation of hemodialysis. EXAM: CT CHEST WITHOUT CONTRAST TECHNIQUE: Multidetector CT imaging of the chest was performed following the standard protocol without IV contrast. COMPARISON:  02/09/2015 FINDINGS: Mediastinum/Lymph Nodes: 2 catheters from right internal jugular approach are seen terminating in the superior vena cava. A catheter from left internal jugular approach is also seen terminating in the distal superior vena cava. No pathologically enlarged lymph nodes identified on this un-enhanced exam. Again seen is a large heterogeneous anterior mediastinal mass extending on the right to  the level of the carina. On previous CT exams this was determined to represent an extension of the thyroid gland. On this exam the thyroid gland is not visualized, as it is collimated off the image. The trachea, and main bronchi are patent. The heart is enlarged. There is no evidence of pericardial effusion. Atherosclerotic disease of the aorta is seen. Lungs/Pleura: There are bilateral pleural effusions, moderate in size, larger one on the left. Compressive atelectasis is seen in bilateral lung bases. Segmental atelectasis of the left lower lobe is noted. Pericentral and centrilobular emphysema of the lungs is seen. Upper  abdomen: No acute findings. Musculoskeletal: No chest wall mass or suspicious bone lesions identified. Multilevel osteoarthritic changes of the thoracic spine are seen. IMPRESSION: Large heterogeneous anterior mediastinal mass, previously determined to represent a thyroid mass. Thyroid cancer cannot be excluded with this appearance. Bilateral moderate in size pleural effusions, the larger of which is on the left, with secondary compressive atelectasis and collapse of significant portion of the left lower lobe. Lung emphysema. Electronically Signed   By: Fidela Salisbury M.D.   On: 02/12/2015 17:07   Ct Angio Chest Pe W/cm &/or Wo Cm  02/09/2015  CLINICAL DATA:  Severe left chest pain that began with dialysis today. Some shortness of breath. Right breast cancer. EXAM: CT ANGIOGRAPHY CHEST WITH CONTRAST TECHNIQUE: Multidetector CT imaging of the chest was performed using the standard protocol during bolus administration of intravenous contrast. Multiplanar CT image reconstructions and MIPs were obtained to evaluate the vascular anatomy. CONTRAST:  38m OMNIPAQUE IOHEXOL 350 MG/ML SOLN COMPARISON:  Chest radiographs obtained earlier today. Chest CT dated 09/19/2014. FINDINGS: Mediastinum/Lymph Nodes: 8 markedly enlarged and heterogeneous thyroid gland extending into the superior mediastinum bilaterally and into the medial upper right chest is again demonstrated. No enlarged lymph nodes. Normally opacified pulmonary arteries with no pulmonary arterial filling defects seen. Lungs/Pleura: Moderate-sized left pleural effusion, increased in amount. Small right pleural effusion, decreased in amount. Associated left greater than right lower lobe atelectasis. The mild bilateral bullous changes. Right lower lobe calcified granuloma. Right chest tube. Upper abdomen: Streak artifacts from the patient's arms. Mild diffuse intra-abdominal fat edema with sparing of fat in the lesser sac, without significant change. There is  also a coarse calcification in that region without significant change. Diffuse subcutaneous fat edema. Musculoskeletal: Thoracic spine degenerative changes. Review of the MIP images confirms the above findings. IMPRESSION: 1. No pulmonary emboli. 2. Moderate-sized left pleural effusion, increased. 3. Small to moderate-sized right pleural effusion, decreased. 4. Bilateral compressive lower lobe atelectasis, greater on the left. 5. Stable huge thyroid goiter with substernal extension and extension into the medial right upper chest. 6. Diffuse anasarca with continued sparing of fatty in the lesser sac, possibly due to lipoma formation. Electronically Signed   By: SClaudie ReveringM.D.   On: 02/09/2015 19:59   UK[place]Venous Img Upper Uni Right  01/23/2015  CLINICAL DATA:  81year old with prior history of right subclavian and right axillary vein DVT with superficial thrombophlebitis of the right cephalic vein in July, [address] presenting with acute onset of swelling in the right upper extremity after placement of a right IJ dialysis catheter on 01/04/2015. Personal history of breast cancer. Patient currently anticoagulated with Coumadin. EXAM: RIGHT UPPER EXTREMITY VENOUS DOPPLER ULTRASOUND TECHNIQUE: Gray-scale sonography with graded compression, as well as color Doppler and duplex ultrasound were performed to evaluate the upper extremity deep venous system from the level of the subclavian vein and including the internal jugular, axillary,  basilic, radial, ulnar and upper cephalic vein. Spectral Doppler was utilized to evaluate flow at rest and with distal augmentation maneuvers. The left internal jugular and subclavian veins were imaged for comparison purposes. COMPARISON:  None. FINDINGS: Contralateral Subclavian Vein: Respiratory phasicity is normal. No evidence of thrombus. Normal compressibility. Contralateral Internal Jugular Vein: No evidence of thrombus. Normal compressibility, respiratory phasicity and response to  augmentation. Internal Jugular Vein: Occlusive thrombus involving the central portion of the right IJ. Remainder of the visualized right IJ is patent. Subclavian Vein: Occlusive thrombus centrally. Peripheral portion of the right subclavian vein is patent. Axillary Vein: No evidence of thrombus. Normal compressibility, respiratory phasicity and response to augmentation. Cephalic Vein: Very small caliber right cephalic vein which demonstrates compressibility but no visible Doppler flow. Basilic Vein: Small caliber vein without evidence of thrombus. Normal compressibility, respiratory phasicity and response to augmentation. Brachial Veins: Paired brachial veins without evidence of thrombus. Normal compressibility, respiratory phasicity and response to augmentation. Radial Veins: No evidence of thrombus. Normal compressibility, respiratory phasicity and response to augmentation. Ulnar Veins: No evidence of thrombus. Normal compressibility, respiratory phasicity and response to augmentation. Venous Reflux:  Not evaluated. Other Findings:  Subcutaneous edema in the distal upper arm. IMPRESSION: 1. Occlusive thrombus involving the central portion of the right internal jugular vein. 2. Occlusive thrombus involving the central portion of the right subclavian vein. 3. No evidence of DVT elsewhere in the right upper extremity. 4. Diminutive cephalic vein which appears patent. Electronically Signed   By: Evangeline Dakin M.D.   On: 01/23/2015 15:47   Dg Chest Port 1 View  02/10/2015  CLINICAL DATA:  Dyspnea and cough.  Central line placement. EXAM: PORTABLE CHEST 1 VIEW COMPARISON:  02/09/2015 FINDINGS: There is a new left jugular central line with tip at the expected location of the SVC near the azygos vein junction dual-lumen right-sided central line again noted. Right chest tube is unchanged in position. No pneumothorax. Dense consolidation and effusion persist on the left, as well as the smaller right-sided effusion.  Moderate vascular congestion is present, as well as central ground-glass opacities and this is mildly worsened from the earlier study. IMPRESSION: 1. New left jugular central line.  No pneumothorax. 2. Worsened ground-glass opacities and vascular congestion bilaterally. This may be partially due to supine positioning of the current study but a component of congestive failure or volume overload would also be a consideration. 3. Persistent dense consolidation and effusion on the left. Electronically Signed   By: Andreas Newport M.D.   On: 02/10/2015 01:48    Microbiology: Recent Results (from the past 240 hour(s))  MRSA PCR Screening     Status: None   Collection Time: 02/10/15  2:20 AM  Result Value Ref Range Status   MRSA by PCR NEGATIVE NEGATIVE Final    Comment:        The GeneXpert MRSA Assay (FDA approved for NASAL specimens only), is one component of a comprehensive MRSA colonization surveillance program. It is not intended to diagnose MRSA infection nor to guide or monitor treatment for MRSA infections.   Blood culture (routine x 2)     Status: None   Collection Time: 02/10/15  2:50 AM  Result Value Ref Range Status   Specimen Description BLOOD RIGHT FOREARM  Final   Special Requests BOTTLES DRAWN AEROBIC AND ANAEROBIC 10CC  Final   Culture NO GROWTH 5 DAYS  Final   Report Status 02/15/2015 FINAL  Final  Culture, blood (routine x 2)  Status: None   Collection Time: 02/10/15  3:35 AM  Result Value Ref Range Status   Specimen Description BLOOD CENTRAL LINE  Final   Special Requests BOTTLES DRAWN AEROBIC AND ANAEROBIC 10CC  Final   Culture NO GROWTH 5 DAYS  Final   Report Status 02/15/2015 FINAL  Final     Labs: Basic Metabolic Panel:  Recent Labs Lab 02/13/15 0410 02/14/15 0430 02/15/15 0420  NA 133* 132* 135  K 3.7 4.0 3.7  CL 99* 98* 100*  CO2 29 28 30   GLUCOSE 133* 163* 132*  BUN 16 24* 16  CREATININE 2.72* 3.73* 2.72*  CALCIUM 8.3* 8.4* 8.3*  MG 2.0  2.1  --   PHOS 3.9 4.1 3.2   Liver Function Tests:  Recent Labs Lab 02/13/15 0410 02/14/15 0430 02/15/15 0420  ALBUMIN 2.0* 2.0* 1.8*   No results for input(s): LIPASE, AMYLASE in the last 168 hours. No results for input(s): AMMONIA in the last 168 hours. CBC:  Recent Labs Lab 02/13/15 0410 02/14/15 0430 02/15/15 0420  WBC 6.0 6.7 6.1  NEUTROABS  --   --  4.2  HGB 8.5* 8.8* 8.8*  HCT 27.4* 28.2* 27.8*  MCV 89.3 89.2 90.0  PLT 168 199 144*   Cardiac Enzymes: No results for input(s): CKTOTAL, CKMB, CKMBINDEX, TROPONINI in the last 168 hours. BNP: BNP (last 3 results)  Recent Labs  11/07/14 1207 01/01/15 1125 02/09/15 1634  BNP 1275.4* 2364.3* >4500.0*    ProBNP (last 3 results) No results for input(s): PROBNP in the last 8760 hours.  CBG: No results for input(s): GLUCAP in the last 168 hours.  Signed:  Charlynne Cousins  [facility] 02/19/2015, 12:16 PM

## 2015-02-19 NOTE — Progress Notes (Signed)
Discharge instructions given, pt and husband verbalized understanding.  VSS. Pt left floor via stretcher with EMS home.

## 2015-02-19 NOTE — Progress Notes (Signed)
Paged Dr. Olevia Bowens, everything set up with hospice for pt to go home.

## 2015-02-19 NOTE — Progress Notes (Signed)
Follow-up Visit [facility] Liaison  Spoke with [name], at bedside to confirm plan for home with hospice upon discharge. Per discussion, plan is for discharge to home as early as today.   Please send signed completed DNR form home with patient.  Patient will need prescriptions for discharge comfort medications.   DME needs discussed and family requested oxygen from [facility].  Patient currently has O2 with [employer], however [facility] will call [name] to delivery new oxygen package.   Completed discharge summary will need to be faxed to [facility] at [phone] when final.  Please notify [facility] when patient is ready to leave unit at discharge-call [phone].   [facility] information and contact numbers have been given to [place] during visit.  Above information shared with Malachy Mood, [facility].  Please call with any questions.   Thank You,  Freddi Starr RN, [facility] Liaison  [phone]

## 2015-02-19 NOTE — Patient Outreach (Signed)
Member remains in the hospital.  According to notes, she will be discharged with hospice.  PCP notified of case closure, letter sent to [facility] home notifying the family of case closure.  Valente David, BSN, [place] Management  [facility] Care Manager [phone]

## 2015-02-19 NOTE — Progress Notes (Signed)
Hospice and [place] (late entry from 02/18/15)  Received request from [facility] to see family for interest in home hospice services. Chart reviewed and met with family outside room at their request. Answered questions and clarified that hospice services are intermittent visits. They expressed interest in supplementing care. Advised them to request hospital list of home health services from [facility]. We discussed equipment needs in home which needs to be followed up on this morning. Patient currently has oxygen through [place] and is asking for additional oxygen tanks. Patient does not want hospital bed due to she already has a specialty bed that is meeting her needs. She is requesting a rolling walker with bench and shelf. She endorses Dr. Einar Gip as hospice attending.   [facility] hospital liaison will follow up with family and RNCM this am.   Thank you.  Erling Conte, [place]

## 2015-02-19 NOTE — Care Management Important Message (Signed)
Important Message  Patient Details  Name: Alison Gross MRN: [id number] Date of Birth: [birth year]   Medicare Important Message Given:  Yes    Barb Merino Macen Joslin 02/19/2015, 3:32 PM

## 2015-02-19 NOTE — Clinical Social Work Note (Signed)
Patient stable for discharge and ambulance transport requested by family and arranged. Patient will be transported home by ambulance ([facility]AR).   Akire Rennert Givens, MSW, LCSW Licensed Clinical Social Worker [place] [phone]

## 2015-02-19 NOTE — Care Management Note (Addendum)
Case Management Note  Patient Details  Name: Alison Gross MRN: [id number] Date of Birth: August 22, 1932  Subjective/Objective:           CM following for progression and d/c planning.         Action/Plan: 02/19/15 CM informed that this pt was seen by Hospice and [facility]) on 02/18/2015 and plan is for d/c to home with home hospice services. This CM awaiting plan from hospice laison and hopefully get this pt d/c to home today.  02/19/15 1040am, Spoke with [facility] hospital liason, Leana Roe and per her this pt is ready for d/c to home, all dme needs will be managed by [facility] in the home. [facility] are able to adm this pt this afternoon in her home. Pt will require ambulance transportation home.  Expected Discharge Date:  02/19/2015               Expected Discharge Plan:  Home w Hospice Care  In-House Referral:     Discharge planning Services  CM Consult  Post Acute Care Choice:  Hospice Choice offered to:  Patient, Spouse, Adult Children  DME Arranged:  Hospice Equipment Package A DME Agency:  [facility]  HH Arranged:  RN, Nurse's Aide [place] Agency:  NA  Status of Service:  Completed, signed off  Medicare Important Message Given:  Yes Date Medicare IM Given:    Medicare IM give by:    Date Additional Medicare IM Given:    Additional Medicare Important Message give by:     If discussed at [place] of Stay Meetings, dates discussed:    Additional Comments:  Adron Bene, RN 02/19/2015, 10:23 AM

## 2015-02-20 ENCOUNTER — Ambulatory Visit: Payer: Commercial Managed Care - HMO | Admitting: *Deleted

## 2015-02-26 ENCOUNTER — Other Ambulatory Visit: Payer: Self-pay | Admitting: Cardiothoracic Surgery

## 2015-02-26 DIAGNOSIS — J90 Pleural effusion, not elsewhere classified: Secondary | ICD-10-CM

## 2015-03-01 ENCOUNTER — Ambulatory Visit
Admission: RE | Admit: 2015-03-01 | Discharge: 2015-03-01 | Disposition: A | Discharge status: Home / Self Care | Payer: Commercial Managed Care - HMO | Source: Ambulatory Visit | Attending: Cardiothoracic Surgery | Admitting: Cardiothoracic Surgery

## 2015-03-01 ENCOUNTER — Encounter: Payer: Self-pay | Admitting: Cardiothoracic Surgery

## 2015-03-01 ENCOUNTER — Ambulatory Visit (INDEPENDENT_AMBULATORY_CARE_PROVIDER_SITE_OTHER): Payer: Commercial Managed Care - HMO | Admitting: Cardiothoracic Surgery

## 2015-03-01 ENCOUNTER — Other Ambulatory Visit: Payer: Self-pay

## 2015-03-01 ENCOUNTER — Encounter (HOSPITAL_COMMUNITY): Payer: Self-pay | Admitting: *Deleted

## 2015-03-01 VITALS — BP 104/52 | HR 74 | Resp 20 | Ht 65.0 in | Wt 95.0 lb

## 2015-03-01 DIAGNOSIS — J948 Other specified pleural conditions: Secondary | ICD-10-CM | POA: Diagnosis not present

## 2015-03-01 DIAGNOSIS — J90 Pleural effusion, not elsewhere classified: Secondary | ICD-10-CM

## 2015-03-01 NOTE — Progress Notes (Signed)
Spoke with pt's husband for pre-op call, pt was napping. He verified medical and surgical history had not changed since she was here within the past 3 weeks. Pt was on Coumadin but is no longer taking it, she has stopped dialysis (none since 02/08/15). Pt is in the care of Hospice.

## 2015-03-01 NOTE — Progress Notes (Signed)
[address]       ,[place] [address]             [phone]                    Lorel D Dufault [place] Medical Record #[id number] Date of Birth: 30-Jan-1933  Referring: Chauncey Cruel, MD Primary Care: Maximino Greenland, MD  Chief Complaint:   SOB s/p talc pleurodesis via right Pleurx catheter  History of Present Illness:    Mrs. Starn is a 81 yo African American Female with known history of thyroid goiter, right breast cancer (in remission), hyperlipidemia, atrial fibrillation on chronic anticoagulation, HTN, DM, and recurrent pleural effusions. The patient presented to [facility] Emergency Department on 09/19/2014 with complaints of worsening shortness of breath for the past several days. She had undergone several thoracentesis in the past, with most recent being 6/16. Approximately 800 cc of exudative fluid ws removed. CT chest obtained in the ED showed a moderate right sided pleural effusion. The patient was admitted to the medicine service for further workup. The patient underwent IR right thoracentesis which removed 1.2 liters hazy, amber fluid. The fluid was sent for cytology which revealed reactive mesothelial cells without evidence of malignancy. Fluid cultures were negative for bacteria.  The patient also complained of dysphagia while in the hospital and SSLP consult was obtained and showed some dysfunction and her diet was adjusted. Post procedure, the patient developed hypotension with acute shock. She required pressor support. Workup later revealed evidence of adrenal insufficiency. She was treated with steroids and fluid balance was corrected. Troponin was mildly elevated and she was ruled out for acute cardiac event. Once the patient was stabilized, it was felt surgical intervention may be required on her goiter. Cardiac surgery was contacted  surgical resection vs. Pleur-x catheter for palliative relief.  Ultimately, because of her  frail status and protein malnutrition, surgery to remove her goiter was NOT recommended. Aright Pleur X catheter be placed and this was done on 09/27/2014. Home health has been draining the right Pleur X.   Since last seen she was sdmitted with progressive renal failure and was started on dialysis, she has now off dialysis and her under hospice care. The amount of fluid drainage from the right Pleurx has decreased to 100 250 mL a day, over the past several days she's had increasing shortness of breath chest x-ray today shows significant increase in left pleural effusion.   Current Activity/ Functional Status:  Patient is independent with mobility/ambulation, transfers, ADL's, IADL's.   Zubrod Score: At the time of surgery this patient's most appropriate activity status/level should be described as: '[]'$     0    Normal activity, no symptoms '[]'$     1    Restricted in physical strenuous activity but ambulatory, able to do out light work '[x]'$     2    Ambulatory and capable of self care, unable to do work activities, up and about               >50 % of waking hours                              '[]'$     3    Only limited self care, in bed greater than 50% of waking hours '[]'$     4    Completely disabled, no self care, confined to  bed or chair '[]'$     5    Moribund   Past Medical History  Diagnosis Date  . Hypercholesterolemia     takes Crestor daily  . Thyroid disease     had iodine radiation  . Hypertension     takes Hyzaar daily  . Dysrhythmia     takes Carvedilol daily  . Pneumonia     history of;last time about 4-65yr ago  . GERD (gastroesophageal reflux disease)     takes Protonix daily  . Gastric ulcer   . History of blood transfusion   . History of colon polyps   . Anemia     takes iron pill daily  . Diabetes mellitus without complication ([facility]     takes Tradjenta daily  . History of radiation therapy 07/12/12-08/26/12    right breast/  . Paroxysmal atrial fibrillation ([facility]     PCP  EKG 09/27/2013: A. Fibrillation.. EKG 09/29/2013: S. Tach.  . Breast cancer ([facility] 04/12/12    right-pos lymph node/left-DCIS  . Shortness of breath on exertion   . Bruit of left carotid artery     [facility]  . Acute upper GI bleeding 01/24/2012  . MGUS (monoclonal gammopathy of unknown significance) 04/21/2013  . Osteoporosis 11/29/2013  . Recurrent right pleural effusion 07/26/2014  . Goiter 07/27/2014  . Substernal goiter 12/23/2012  . Pleural effusion, right 12/23/2012  . Dysphonia 09/20/2014  . Vocal cord paralysis     Suspected due to massive substernal goiter  . CHF (congestive heart failure) ([facility]   . Stage III chronic kidney disease 07/26/2014  . Type 2 diabetes mellitus with renal manifestations ([facility] 07/26/2014  . ESRD (end stage renal disease) ([facility]     On Hemodialysis, MWF    Past Surgical History  Procedure Laterality Date  . Carpal tunnel release Left   . Breast biopsy    . Esophagogastroduodenoscopy  01/24/2012    Procedure: ESOPHAGOGASTRODUODENOSCOPY (EGD);  Surgeon: MJeryl Columbia MD;  [facility];  Service: Endoscopy;  Laterality: N/A;  . Thyroid surgery      "removed goiter"  . Cystectomy      from back of neck  . Knee surgery      right with rods  . Esophagogastroduodenoscopy    . Colonoscopy    . Total mastectomy Bilateral 05/10/2012    Procedure: RIGHT modified mastectomy; LEFT total mastectomy;  Surgeon: CHaywood Lasso MD;  [facility]  Service: General;  Laterality: Bilateral;  . Axillary sentinel node biopsy Right 05/10/2012    Procedure: AXILLARY SENTINEL lymph  NODE BIOPSY;  Surgeon: CHaywood Lasso MD;  Location: [facility]  Service: General;  Laterality: Right;  nuclear medicine injection right side  7:00 am   . Abdominal hysterectomy      fibroids, with bilateral SO  . Chest tube insertion Right 09/27/2014    Procedure: INSERTION OF RIGHT PLEURAL DRAINAGE CATHETER;  Surgeon: EGrace Isaac  MD;  Location: [facility]  Service: Thoracic;  Laterality: Right;  . Insertion of dialysis catheter N/A 01/05/2015    Procedure: INSERTION OF DIALYSIS CATHETER;  Surgeon: CAngelia Mould MD;  Location: [facility]  Service: Vascular;  Laterality: N/A;  . Bascilic vein transposition Left 02/06/2015    Procedure: FIRST STAGE BRACHIAL VEIN TRANSPOSITION;  Surgeon: BConrad Seneca MD;  Location: [facility]  Service: Vascular;  Laterality: Left;    Family History  Problem Relation Age of Onset  .  Brain cancer Mother   . Aneurysm Father   . Diabetes Sister   . Diabetes Brother   . Diabetes Sister   . Diabetes Brother   . Heart failure Sister     Social History   Social History  . Marital Status: Married    Spouse Name: [name]  . Number of Children: 2  . Years of Education: N/A   Social History Main Topics  . Smoking status: Former Smoker -- 0.00 packs/day    Types: Cigarettes    Quit date: 07/26/1971  . Smokeless tobacco: Never Used  . Alcohol Use: No  . Drug Use: No  . Sexual Activity: Not Currently    Birth Control/ Protection: Surgical     Comment: menarche age 60,1st live birth 51, P2, no HRT   Social History Narrative   Married.  Ambulates independently.      History  Smoking status  . Former Smoker -- 0.00 packs/day  . Types: Cigarettes  . Quit date: 07/26/1971  Smokeless tobacco  . Never Used    History  Alcohol Use No     No Known Allergies  Current Outpatient Prescriptions  Medication Sig Dispense Refill  . acetaminophen (TYLENOL) 325 MG tablet Take 2 tablets (650 mg total) by mouth every 4 (four) hours as needed for headache or mild pain.    . CVS SENNA PLUS 8.6-50 MG tablet Take 1 tablet by mouth 2 (two) times daily.  0  . midodrine (PROAMATINE) 10 MG tablet TAKE 1 TABLET BY MOUTH 3 TIMES A DAY WITH MEALS  1  . Morphine Sulfate (MORPHINE CONCENTRATE) 10 MG/0.5ML SOLN concentrated solution Take 0.5 mLs (10 mg total) by mouth every 2 (two) hours as needed for  moderate pain, severe pain or shortness of breath. 42 mL 0  . Multiple Vitamins-Minerals (MULTIVITAMIN WITH MINERALS) tablet Take 1 tablet by mouth daily.    . prochlorperazine (COMPAZINE) 10 MG tablet Take 10 mg by mouth every 6 (six) hours as needed for nausea or vomiting.    . VENTOLIN HFA 108 (90 BASE) MCG/ACT inhaler INHALE 2 PUFFS BY MOUTH EVERY 4 TO 6 HOURS AS NEEDED  6   No current facility-administered medications for this visit.      Review of Systems:     Cardiac Review of Systems: Y or N  Chest Pain [  n  ]  Resting SOB [ y  ] Exertional SOB  Blue.Reese  ]  Orthopnea Blue.Reese  ]   Pedal Edema [  y ]    Palpitations [ y ] Syncope  [n  ]   Presyncope [ y  ]  General Review of Systems: [Y] = yes [  ]=no Constitional: recent weight change [  ];  Wt loss over the last 3 months [   ] anorexia [  ]; fatigue [  ]; nausea [  ]; night sweats [  ]; fever [  ]; or chills [  ];          Dental: poor dentition[  ]; Last Dentist visit:   Eye : blurred vision [  ]; diplopia [   ]; vision changes [  ];  Amaurosis fugax[  ]; Resp: cough [  ];  wheezing[  ];  hemoptysis[  ]; shortness of breath[  ]; paroxysmal nocturnal dyspnea[  ]; dyspnea on exertion[  ]; or orthopnea[  ];  GI:  gallstones[  ], vomiting[  ];  dysphagia[  ]; melena[  ];  hematochezia [  ];  heartburn[  ];   Hx of  Colonoscopy[  ]; GU: kidney stones [  ]; hematuria[  ];   dysuria [  ];  nocturia[  ];  history of     obstruction [  ]; urinary frequency [  ]             Skin: rash, swelling[  ];, hair loss[  ];  peripheral edema[  ];  or itching[  ]; Musculosketetal: myalgias[  ];  joint swelling[  ];  joint erythema[  ];  joint pain[  ];  back pain[  ];  Heme/Lymph: bruising[  ];  bleeding[  ];  anemia[  ];  Neuro: TIA[  ];  headaches[  ];  stroke[  ];  vertigo[  ];  seizures[  ];   paresthesias[  ];  difficulty walking[  ];  Psych:depression[  ]; anxiety[  ];  Endocrine: diabetes[  ];  thyroid dysfunction[  ];  Immunizations: Flu up to date [   ]; Pneumococcal up to date [  ];  Other:  Physical Exam: BP 104/52 mmHg  Pulse 74  Resp 20  Ht '5\' 5"'$  (1.651 m)  Wt 95 lb (43.092 kg)  BMI 15.81 kg/m2  SpO2 99%  PHYSICAL EXAMINATION: General appearance: alert, cooperative and appears older than stated age Head: Normocephalic, without obvious abnormality, atraumatic Neck: no adenopathy, no carotid bruit, no JVD, supple, symmetrical, trachea midline and thyroid not enlarged, symmetric, no tenderness/mass/nodules Lymph nodes: Cervical, supraclavicular, and axillary nodes normal. Resp: diminished breath sounds bibasilar Back: symmetric, no curvature. ROM normal. No CVA tenderness. Cardio: irregularly irregular rhythm GI: soft, non-tender; bowel sounds normal; no masses,  no organomegaly Extremities: edema 3+ edema bilaterally Neurologic: Grossly normal New left upper arm dialysis graft With palpable thrill Diagnostic Studies & Laboratory data:     Recent Radiology Findings:  Dg Chest 2 View  03/01/2015  CLINICAL DATA:  History of pleural effusion EXAM: CHEST  2 VIEW COMPARISON:  Portable chest x-ray of February 10, 2015 and CT scan of the chest of February 12, 2015 FINDINGS: On the right a small pleural effusion is present. The small caliber pleural space catheter has its tip overlying the posterior medial aspect of the eighth rib. There is no pneumothorax. On the left there is a moderate-sized pleural effusion optic occupying least 1/2 to 3 fifth of the lung volume. There is no pneumothorax. A large right paratracheal mass persists. The left heart border is obscured. The central pulmonary vascularity is prominent and there is mild pulmonary interstitial edema slightly less conspicuous than on the previous study. A dual-lumen catheter is in place via the right internal jugular approach with the tip of the proximal port overlying the proximal SVC and the distal port overlying the mid SVC. IMPRESSION: 1. Interval increase in the volume of  pleural fluid on the left such did occupies at least 1/2 of the pleural space. On the right a small pleural effusion persist. The chest tube is unchanged in position. 2. Stable low-grade CHF.  Stable right paratracheal mass. Electronically Signed   By: David  Martinique M.D.   On: 03/01/2015 09:13    I have independently reviewed the above radiologic studies.  Recent Lab Findings: Lab Results  Component Value Date   WBC 6.1 02/15/2015   HGB 8.8* 02/15/2015   HCT 27.8* 02/15/2015   PLT 144* 02/15/2015   GLUCOSE 132* 02/15/2015   ALT 27 02/09/2015   AST 27 02/09/2015   NA 135 02/15/2015  K 3.7 02/15/2015   CL 100* 02/15/2015   CREATININE 2.72* 02/15/2015   BUN 16 02/15/2015   CO2 30 02/15/2015   TSH <0.010* 01/01/2015   INR 1.14 02/12/2015   HGBA1C 6.2* 07/27/2014      Assessment / Plan:   #1 continued bilateral pleural effusions  Over the past several days patient's had increasing shortness of breath follow-up chest x-ray today shows increasing left pleural effusion, I recommended to her that we proceed with placement of left Pleurx catheter to keep her respiratory status comfortable. We'll plan to do this tomorrow.     [name] MD      [address] [place] [address] Office [phone]   Beeper [phone]  03/01/2015 10:08 AM

## 2015-03-02 ENCOUNTER — Ambulatory Visit (HOSPITAL_COMMUNITY): Payer: Commercial Managed Care - HMO | Admitting: Anesthesiology

## 2015-03-02 ENCOUNTER — Ambulatory Visit (HOSPITAL_COMMUNITY)
Admission: RE | Admit: 2015-03-02 | Discharge: 2015-03-02 | Disposition: A | Discharge status: Home / Self Care | Payer: Commercial Managed Care - HMO | Source: Ambulatory Visit | Attending: Cardiothoracic Surgery | Admitting: Cardiothoracic Surgery

## 2015-03-02 ENCOUNTER — Ambulatory Visit (HOSPITAL_COMMUNITY): Payer: Commercial Managed Care - HMO

## 2015-03-02 ENCOUNTER — Encounter (HOSPITAL_COMMUNITY): Payer: Self-pay | Admitting: General Practice

## 2015-03-02 ENCOUNTER — Encounter (HOSPITAL_COMMUNITY)
Admission: RE | Disposition: A | Discharge status: Home / Self Care | Payer: Self-pay | Source: Ambulatory Visit | Attending: Cardiothoracic Surgery

## 2015-03-02 DIAGNOSIS — E7800 Pure hypercholesterolemia, unspecified: Secondary | ICD-10-CM | POA: Diagnosis not present

## 2015-03-02 DIAGNOSIS — I509 Heart failure, unspecified: Secondary | ICD-10-CM | POA: Diagnosis not present

## 2015-03-02 DIAGNOSIS — D649 Anemia, unspecified: Secondary | ICD-10-CM | POA: Insufficient documentation

## 2015-03-02 DIAGNOSIS — Z992 Dependence on renal dialysis: Secondary | ICD-10-CM | POA: Diagnosis not present

## 2015-03-02 DIAGNOSIS — N186 End stage renal disease: Secondary | ICD-10-CM | POA: Diagnosis not present

## 2015-03-02 DIAGNOSIS — E46 Unspecified protein-calorie malnutrition: Secondary | ICD-10-CM | POA: Diagnosis not present

## 2015-03-02 DIAGNOSIS — D472 Monoclonal gammopathy: Secondary | ICD-10-CM | POA: Diagnosis not present

## 2015-03-02 DIAGNOSIS — K219 Gastro-esophageal reflux disease without esophagitis: Secondary | ICD-10-CM | POA: Diagnosis not present

## 2015-03-02 DIAGNOSIS — Z853 Personal history of malignant neoplasm of breast: Secondary | ICD-10-CM | POA: Diagnosis not present

## 2015-03-02 DIAGNOSIS — E1122 Type 2 diabetes mellitus with diabetic chronic kidney disease: Secondary | ICD-10-CM | POA: Insufficient documentation

## 2015-03-02 DIAGNOSIS — Z87891 Personal history of nicotine dependence: Secondary | ICD-10-CM | POA: Insufficient documentation

## 2015-03-02 DIAGNOSIS — E079 Disorder of thyroid, unspecified: Secondary | ICD-10-CM | POA: Insufficient documentation

## 2015-03-02 DIAGNOSIS — Z923 Personal history of irradiation: Secondary | ICD-10-CM | POA: Insufficient documentation

## 2015-03-02 DIAGNOSIS — Z419 Encounter for procedure for purposes other than remedying health state, unspecified: Secondary | ICD-10-CM

## 2015-03-02 DIAGNOSIS — Z681 Body mass index (BMI) 19 or less, adult: Secondary | ICD-10-CM | POA: Diagnosis not present

## 2015-03-02 DIAGNOSIS — J90 Pleural effusion, not elsewhere classified: Secondary | ICD-10-CM | POA: Insufficient documentation

## 2015-03-02 DIAGNOSIS — I5033 Acute on chronic diastolic (congestive) heart failure: Secondary | ICD-10-CM

## 2015-03-02 DIAGNOSIS — I132 Hypertensive heart and chronic kidney disease with heart failure and with stage 5 chronic kidney disease, or end stage renal disease: Secondary | ICD-10-CM | POA: Insufficient documentation

## 2015-03-02 DIAGNOSIS — E049 Nontoxic goiter, unspecified: Secondary | ICD-10-CM | POA: Insufficient documentation

## 2015-03-02 DIAGNOSIS — Z9689 Presence of other specified functional implants: Secondary | ICD-10-CM

## 2015-03-02 HISTORY — PX: CHEST TUBE INSERTION: SHX231

## 2015-03-02 LAB — PROTIME-INR
INR: 1.43 (ref 0.00–1.49)
Prothrombin Time: 17.5 seconds — ABNORMAL HIGH (ref 11.6–15.2)

## 2015-03-02 LAB — COMPREHENSIVE METABOLIC PANEL
ALT: 18 U/L (ref 14–54)
AST: 29 U/L (ref 15–41)
Albumin: 2.6 g/dL — ABNORMAL LOW (ref 3.5–5.0)
Alkaline Phosphatase: 115 U/L (ref 38–126)
Anion gap: 22 — ABNORMAL HIGH (ref 5–15)
BUN: 165 mg/dL — ABNORMAL HIGH (ref 6–20)
CO2: 17 mmol/L — ABNORMAL LOW (ref 22–32)
Calcium: 10 mg/dL (ref 8.9–10.3)
Chloride: 102 mmol/L (ref 101–111)
Creatinine, Ser: 13.09 mg/dL — ABNORMAL HIGH (ref 0.44–1.00)
GFR calc Af Amer: 3 mL/min — ABNORMAL LOW (ref >60)
GFR calc non Af Amer: 2 mL/min — ABNORMAL LOW (ref >60)
Glucose, Bld: 109 mg/dL — ABNORMAL HIGH (ref 65–99)
Potassium: >7.5 mmol/L (ref 3.5–5.1)
Sodium: 141 mmol/L (ref 135–145)
Total Bilirubin: 0.6 mg/dL (ref 0.3–1.2)
Total Protein: 6.5 g/dL (ref 6.5–8.1)

## 2015-03-02 LAB — CBC
HCT: 32.1 % — ABNORMAL LOW (ref 36.0–46.0)
Hemoglobin: 10.1 g/dL — ABNORMAL LOW (ref 12.0–15.0)
MCH: 28.8 pg (ref 26.0–34.0)
MCHC: 31.5 g/dL (ref 30.0–36.0)
MCV: 91.5 fL (ref 78.0–100.0)
Platelets: 264 10*3/uL (ref 150–400)
RBC: 3.51 MIL/uL — ABNORMAL LOW (ref 3.87–5.11)
RDW: 17.2 % — ABNORMAL HIGH (ref 11.5–15.5)
WBC: 11.2 10*3/uL — ABNORMAL HIGH (ref 4.0–10.5)

## 2015-03-02 LAB — APTT: aPTT: 31 seconds (ref 24–37)

## 2015-03-02 LAB — GLUCOSE, CAPILLARY: Glucose-Capillary: 94 mg/dL (ref 65–99)

## 2015-03-02 SURGERY — Surgical Case
Anesthesia: *Unknown

## 2015-03-02 SURGERY — INSERTION, PLEURAL DRAINAGE CATHETER
Anesthesia: Monitor Anesthesia Care | Site: Chest | Laterality: Left

## 2015-03-02 MED ORDER — SODIUM CHLORIDE 0.9 % IV SOLN
INTRAVENOUS | Status: DC
  Administered 2015-03-02: 11:00:00 via INTRAVENOUS

## 2015-03-02 MED ORDER — PROMETHAZINE HCL 25 MG/ML IJ SOLN: 6.2500 mg | INTRAMUSCULAR | Status: DC | PRN

## 2015-03-02 MED ORDER — LIDOCAINE HCL 1 % IJ SOLN
INTRAMUSCULAR | Status: DC | PRN
  Administered 2015-03-02: 11 mL

## 2015-03-02 MED ORDER — PROPOFOL 10 MG/ML IV BOLUS
INTRAVENOUS | Status: AC
  Filled 2015-03-02: qty 20

## 2015-03-02 MED ORDER — FENTANYL CITRATE (PF) 100 MCG/2ML IJ SOLN
INTRAMUSCULAR | Status: AC
  Filled 2015-03-02: qty 2

## 2015-03-02 MED ORDER — FENTANYL CITRATE (PF) 250 MCG/5ML IJ SOLN
INTRAMUSCULAR | Status: AC
  Filled 2015-03-02: qty 5

## 2015-03-02 MED ORDER — 0.9 % SODIUM CHLORIDE (POUR BTL) OPTIME
TOPICAL | Status: DC | PRN
  Administered 2015-03-02: 1000 mL

## 2015-03-02 MED ORDER — PHENYLEPHRINE 40 MCG/ML (10ML) SYRINGE FOR IV PUSH (FOR BLOOD PRESSURE SUPPORT)
PREFILLED_SYRINGE | INTRAVENOUS | Status: AC
  Filled 2015-03-02: qty 10

## 2015-03-02 MED ORDER — ETOMIDATE 2 MG/ML IV SOLN
INTRAVENOUS | Status: AC
  Filled 2015-03-02: qty 10

## 2015-03-02 MED ORDER — MEPERIDINE HCL 25 MG/ML IJ SOLN: 6.2500 mg | INTRAMUSCULAR | Status: DC | PRN

## 2015-03-02 MED ORDER — FENTANYL CITRATE (PF) 100 MCG/2ML IJ SOLN
25.0000 ug | INTRAMUSCULAR | Status: DC | PRN
  Administered 2015-03-02: 25 ug via INTRAVENOUS

## 2015-03-02 MED ORDER — CALCIUM CHLORIDE 10 % IV SOLN
INTRAVENOUS | Status: AC
  Filled 2015-03-02: qty 20

## 2015-03-02 MED ORDER — LIDOCAINE HCL (CARDIAC) 20 MG/ML IV SOLN
INTRAVENOUS | Status: AC
  Filled 2015-03-02: qty 10

## 2015-03-02 MED ORDER — ARTIFICIAL TEARS OP OINT
TOPICAL_OINTMENT | OPHTHALMIC | Status: AC
  Filled 2015-03-02: qty 3.5

## 2015-03-02 MED ORDER — ROCURONIUM BROMIDE 50 MG/5ML IV SOLN
INTRAVENOUS | Status: AC
  Filled 2015-03-02: qty 4

## 2015-03-02 MED ORDER — GLYCOPYRROLATE 0.2 MG/ML IJ SOLN
INTRAMUSCULAR | Status: AC
  Filled 2015-03-02: qty 3

## 2015-03-02 MED ORDER — ONDANSETRON HCL 4 MG/2ML IJ SOLN
INTRAMUSCULAR | Status: AC
  Filled 2015-03-02: qty 2

## 2015-03-02 MED ORDER — CALCIUM CHLORIDE 10 % IV SOLN
INTRAVENOUS | Status: DC | PRN
  Administered 2015-03-02 (×4): 100 mg via INTRAVENOUS

## 2015-03-02 MED ORDER — PHENYLEPHRINE HCL 10 MG/ML IJ SOLN
INTRAMUSCULAR | Status: AC
  Filled 2015-03-02: qty 1

## 2015-03-02 MED ORDER — SUCCINYLCHOLINE CHLORIDE 20 MG/ML IJ SOLN
INTRAMUSCULAR | Status: AC
  Filled 2015-03-02: qty 2

## 2015-03-02 MED ORDER — SODIUM CHLORIDE 0.9 % IJ SOLN
INTRAMUSCULAR | Status: AC
  Filled 2015-03-02: qty 10

## 2015-03-02 MED ORDER — CEFUROXIME SODIUM 1.5 G IJ SOLR
1.5000 g | INTRAMUSCULAR | Status: AC
  Administered 2015-03-02: 1.5 g via INTRAVENOUS
  Filled 2015-03-02: qty 1.5

## 2015-03-02 MED ORDER — EPHEDRINE SULFATE 50 MG/ML IJ SOLN
INTRAMUSCULAR | Status: AC
  Filled 2015-03-02: qty 1

## 2015-03-02 SURGICAL SUPPLY — 28 items
ADH SKN CLS APL DERMABOND .7 (GAUZE/BANDAGES/DRESSINGS) ×1
BRUSH SCRUB EZ PLAIN DRY (MISCELLANEOUS) ×4 IMPLANT
CANISTER SUCTION 2500CC (MISCELLANEOUS) ×2 IMPLANT
COVER SURGICAL LIGHT HANDLE (MISCELLANEOUS) ×2 IMPLANT
COVER TRANSDUCER ULTRASND GEL (DRAPE) ×2 IMPLANT
DERMABOND ADVANCED (GAUZE/BANDAGES/DRESSINGS) ×1
DERMABOND ADVANCED .7 DNX12 (GAUZE/BANDAGES/DRESSINGS) ×1 IMPLANT
DRAPE C-ARM 42X72 X-RAY (DRAPES) ×2 IMPLANT
DRAPE LAPAROSCOPIC ABDOMINAL (DRAPES) ×2 IMPLANT
DRSG TEGADERM 4X4.75 (GAUZE/BANDAGES/DRESSINGS) ×1 IMPLANT
GLOVE BIO SURGEON STRL SZ 6.5 (GLOVE) ×4 IMPLANT
GOWN STRL REUS W/ TWL LRG LVL3 (GOWN DISPOSABLE) ×2 IMPLANT
GOWN STRL REUS W/TWL LRG LVL3 (GOWN DISPOSABLE) ×4
KIT BASIN OR (CUSTOM PROCEDURE TRAY) ×2 IMPLANT
KIT PLEURX DRAIN CATH 1000ML (MISCELLANEOUS) ×2 IMPLANT
KIT PLEURX DRAIN CATH 15.5FR (DRAIN) ×2 IMPLANT
KIT ROOM TURNOVER OR (KITS) ×2 IMPLANT
NS IRRIG 1000ML POUR BTL (IV SOLUTION) ×2 IMPLANT
PACK GENERAL/GYN (CUSTOM PROCEDURE TRAY) ×2 IMPLANT
PAD ARMBOARD 7.5X6 YLW CONV (MISCELLANEOUS) ×4 IMPLANT
SET DRAINAGE LINE (MISCELLANEOUS) IMPLANT
SPONGE GAUZE 4X4 12PLY STER LF (GAUZE/BANDAGES/DRESSINGS) ×1 IMPLANT
SUT ETHILON 3 0 FSL (SUTURE) ×2 IMPLANT
SUT VIC AB 3-0 X1 27 (SUTURE) ×2 IMPLANT
TOWEL OR 17X24 6PK STRL BLUE (TOWEL DISPOSABLE) ×2 IMPLANT
TOWEL OR 17X26 10 PK STRL BLUE (TOWEL DISPOSABLE) ×2 IMPLANT
VALVE REPLACEMENT CAP (MISCELLANEOUS) IMPLANT
WATER STERILE IRR 1000ML POUR (IV SOLUTION) ×2 IMPLANT

## 2015-03-06 ENCOUNTER — Encounter (HOSPITAL_COMMUNITY): Payer: Self-pay | Admitting: Cardiothoracic Surgery

## 2015-03-11 NOTE — Care Management Note (Signed)
Case Management Note  Patient Details  Name: Alison Gross MRN: [id number] Date of Birth: [birth year]  Subjective/Objective:                  82 y.o. Female; PRE-OPERATIVE DIAGNOSIS: LEFT PLEURAL EFFUSION. //Home with spouse  Action/Plan: PROCEDURE: Procedure(s): INSERTION LEFT PLEURAL DRAINAGE CATHETER (Left).  Follow for disposition needs.   Expected Discharge Date:    16-Mar-2015              Expected Discharge Plan:  [place], Home with Hospice  In-House Referral:  NA  Discharge planning Services  CM Consult  Post Acute Care Choice:  Durable Medical Equipment, Resumption of Svcs/PTA Jahniyah Revere Choice offered to:  Patient, Adult Children  DME Arranged:  Chest tube pluerex DME Agency:     HH Arranged:  RN [place] Agency:     Status of Service:  Completed, signed off  Medicare Important Message Given:    Date Medicare IM Given:    Medicare IM give by:    Date Additional Medicare IM Given:    Additional Medicare Important Message give by:     If discussed at [place] of Stay Meetings, dates discussed:    Additional Comments: Pt currently active with Hospice and [facility]) with contact number of [phone].  Resumption of care orders for management of newly placed catheter.   Fuller Mandril, RN 03-16-15, 1:18 PM

## 2015-03-11 NOTE — Discharge Planning (Signed)
[facility] documents faxed to [phone]

## 2015-03-11 NOTE — Progress Notes (Signed)
Discussed case with Hospice  MD who is caring for patient Dr Gracy Racer - he is aware of elevated K , will send nurse e to patient house today after discharge,   [name] MD      [address] [place] [address] Office [phone]   Alison Gross

## 2015-03-11 NOTE — H&P (Signed)
[address]       Alison Gross,Alison Gross [id number]             [phone]                    Tiffine D Bergstresser [place] Medical Record #[id number] Date of Birth: September 17, 1932  Referring: No ref. provider found Primary Care: Maximino Greenland, MD  Chief Complaint:   SOB s/p talc pleurodesis via right Pleurx catheter  History of Present Illness:    [name] is a 82 yo African American Female with known history of thyroid goiter, right breast cancer (in remission), hyperlipidemia, atrial fibrillation on chronic anticoagulation, HTN, DM, and recurrent pleural effusions. The patient presented to [facility] Emergency Department on 09/19/2014 with complaints of worsening shortness of breath for the past several days. She had undergone several thoracentesis in the past, with most recent being 6/16. Approximately 800 cc of exudative fluid ws removed. CT chest obtained in the ED showed a moderate right sided pleural effusion. The patient was admitted to the [facility] service for further workup. The patient underwent IR right thoracentesis which removed 1.2 liters hazy, amber fluid. The fluid was sent for cytology which revealed reactive mesothelial cells without evidence of malignancy. Fluid cultures were negative for bacteria.  The patient also complained of dysphagia while in the hospital and SSLP consult was obtained and showed some dysfunction and her diet was adjusted. Post procedure, the patient developed hypotension with acute shock. She required pressor support. Workup later revealed evidence of adrenal insufficiency. She was treated with steroids and fluid balance was corrected. Troponin was mildly elevated and she was ruled out for acute cardiac event. Once the patient was stabilized, it was felt surgical intervention may be required on her goiter. Cardiac surgery was contacted  surgical resection vs. Pleur-x catheter for palliative relief.  Ultimately, because of her  frail status and protein malnutrition, surgery to remove her goiter was NOT recommended. Aright Pleur X catheter be placed and this was done on 09/27/2014. Home health has been draining the right Pleur X.   Since last seen she was sdmitted with progressive renal failure and was started on dialysis, she has now off dialysis and her under hospice care. The amount of fluid drainage from the right Pleurx has decreased to 100 250 mL a day, over the past several days she's had increasing shortness of breath chest x-ray today shows significant increase in left pleural effusion.   Current Activity/ Functional Status:  Patient is independent with mobility/ambulation, transfers, ADL's, IADL's.   Zubrod Score: At the time of surgery this patient's most appropriate activity status/level should be described as: '[]'$     0    Normal activity, no symptoms '[]'$     1    Restricted in physical strenuous activity but ambulatory, able to do out light work '[x]'$     2    Ambulatory and capable of self care, unable to do work activities, up and about               >50 % of waking hours                              '[]'$     3    Only limited self care, in bed greater than 50% of waking hours '[]'$     4    Completely disabled, no self care, confined to  bed or chair '[]'$     5    Moribund   Past Medical History  Diagnosis Date  . Hypercholesterolemia     takes Crestor daily  . Thyroid disease     had iodine radiation  . Pneumonia     history of;last time about 4-62yr ago  . GERD (gastroesophageal reflux disease)     takes Protonix daily  . Gastric ulcer   . History of blood transfusion   . History of colon polyps   . Anemia     takes iron pill daily  . History of radiation therapy 07/12/12-08/26/12    right breast/  . Paroxysmal atrial fibrillation ([facility]     PCP EKG 09/27/2013: A. Fibrillation.. EKG 09/29/2013: S. Tach.  . Breast cancer ([facility] 04/12/12    right-pos lymph node/left-DCIS  . Shortness of breath on exertion     . Bruit of left carotid artery     12[facility]06[facility]13 [facility]  . Acute upper GI bleeding 01/24/2012  . MGUS (monoclonal gammopathy of unknown significance) 04/21/2013  . Osteoporosis 11/29/2013  . Recurrent right pleural effusion 07/26/2014  . Goiter 07/27/2014  . Substernal goiter 12/23/2012  . Pleural effusion, right 12/23/2012  . Dysphonia 09/20/2014  . Vocal cord paralysis     Suspected due to massive substernal goiter  . CHF (congestive heart failure) ([facility]   . Stage III chronic kidney disease 07/26/2014  . ESRD (end stage renal disease) ([facility]     On Hemodialysis, MWF  . Hypertension   . Dysrhythmia   . Diabetes mellitus without complication ([facility]   . Type 2 diabetes mellitus with renal manifestations ([facility] 07/26/2014    Past Surgical History  Procedure Laterality Date  . Carpal tunnel release Left   . Breast biopsy    . Esophagogastroduodenoscopy  01/24/2012    Procedure: ESOPHAGOGASTRODUODENOSCOPY (EGD);  Surgeon: MJeryl Columbia MD;  [facility];  Service: Endoscopy;  Laterality: N/A;  . Thyroid surgery      "removed goiter"  . Cystectomy      from back of neck  . Knee surgery      right with rods  . Esophagogastroduodenoscopy    . Colonoscopy    . Total mastectomy Bilateral 05/10/2012    Procedure: RIGHT modified mastectomy; LEFT total mastectomy;  Surgeon: CHaywood Lasso MD;  Location: [facility]  Service: General;  Laterality: Bilateral;  . Axillary sentinel node biopsy Right 05/10/2012    Procedure: AXILLARY SENTINEL lymph  NODE BIOPSY;  Surgeon: CHaywood Lasso MD;  Location: [facility]  Service: General;  Laterality: Right;  nuclear Alison injection right side  7:00 am   . Abdominal hysterectomy      fibroids, with bilateral SO  . Chest tube insertion Right 09/27/2014    Procedure: INSERTION OF RIGHT PLEURAL DRAINAGE CATHETER;  Surgeon: EGrace Isaac MD;  [facility]: [facility]  Service: Thoracic;  Laterality: Right;  . Insertion  of dialysis catheter N/A 01/05/2015    Procedure: INSERTION OF DIALYSIS CATHETER;  Surgeon: CAngelia Mould MD;  Location: [facility]  Service: Vascular;  Laterality: N/A;  . Bascilic vein transposition Left 02/06/2015    Procedure: FIRST STAGE BRACHIAL VEIN TRANSPOSITION;  Surgeon: BConrad Gallant MD;  [facility]  Service: Vascular;  Laterality: Left;    Family History  Problem Relation Age of Onset  . Brain cancer Mother   . Aneurysm Father   . Diabetes Sister   . Diabetes  Brother   . Diabetes Sister   . Diabetes Brother   . Heart failure Sister     Social History   Social History  . Marital Status: Married    Spouse Name: [name]  . Number of Children: 2  . Years of Education: N/A   Social History Main Topics  . Smoking status: Former Smoker -- 0.00 packs/day    Types: Cigarettes    Quit date: 07/26/1971  . Smokeless tobacco: Never Used  . Alcohol Use: No  . Drug Use: No  . Sexual Activity: Not Currently    Birth Control/ Protection: Surgical     Comment: menarche age 76,1st live birth 63, P2, no HRT   Social History Narrative   Married.  Ambulates independently.      History  Smoking status  . Former Smoker -- 0.00 packs/day  . Types: Cigarettes  . Quit date: 07/26/1971  Smokeless tobacco  . Never Used    History  Alcohol Use No     No Known Allergies  Current Facility-Administered Medications  Medication Dose Route Frequency Provider Last Rate Last Dose  . 0.9 %  sodium chloride infusion   Intravenous Continuous Nolon Nations, MD      . cefUROXime (ZINACEF) 1.5 g in dextrose 5 % 50 mL IVPB  1.5 g Intravenous To SS-Surg [name], MD          Review of Systems:     Cardiac Review of Systems: Y or N  Chest Pain [  n  ]  Resting SOB [ y  ] Exertional SOB  Blue.Reese  ]  Vertell Limber Blue.Reese  ]   Pedal Edema [  y ]    Palpitations [ y ] Syncope  [n  ]   Presyncope [ y  ]  General Review of Systems: [Y] = yes [  ]=no Constitional: recent weight  change [  ];  Wt loss over the last 3 months [   ] anorexia [  ]; fatigue [  ]; nausea [  ]; night sweats [  ]; fever [  ]; or chills [  ];          Dental: poor dentition[  ]; Last Dentist visit:   Eye : blurred vision [  ]; diplopia [   ]; vision changes [  ];  Amaurosis fugax[  ]; Resp: cough [  ];  wheezing[  ];  hemoptysis[  ]; shortness of breath[  ]; paroxysmal nocturnal dyspnea[  ]; dyspnea on exertion[  ]; or orthopnea[  ];  GI:  gallstones[  ], vomiting[  ];  dysphagia[  ]; melena[  ];  hematochezia [  ]; heartburn[  ];   Hx of  Colonoscopy[  ]; GU: kidney stones [  ]; hematuria[  ];   dysuria [  ];  nocturia[  ];  history of     obstruction [  ]; urinary frequency [  ]             Skin: rash, swelling[  ];, hair loss[  ];  peripheral edema[  ];  or itching[  ]; Musculosketetal: myalgias[  ];  joint swelling[  ];  joint erythema[  ];  joint pain[  ];  back pain[  ];  Heme/Lymph: bruising[  ];  bleeding[  ];  anemia[  ];  Neuro: TIA[  ];  headaches[  ];  stroke[  ];  vertigo[  ];  seizures[  ];   paresthesias[  ];  difficulty walking[  ];  Psych:depression[  ]; anxiety[  ];  Endocrine: diabetes[  ];  thyroid dysfunction[  ];  Immunizations: Flu up to date [  ]; Pneumococcal up to date [  ];  Other:  Physical Exam: Ht '5\' 5"'$  (1.651 m)  Wt 95 lb (43.092 kg)  BMI 15.81 kg/m2  PHYSICAL EXAMINATION: General appearance: alert, cooperative and appears older than stated age Head: Normocephalic, without obvious abnormality, atraumatic Neck: no adenopathy, no carotid bruit, no JVD, supple, symmetrical, trachea midline and thyroid not enlarged, symmetric, no tenderness/mass/nodules Lymph nodes: Cervical, supraclavicular, and axillary nodes normal. Resp: diminished breath sounds bibasilar Back: symmetric, no curvature. ROM normal. No CVA tenderness. Cardio: irregularly irregular rhythm GI: soft, non-tender; bowel sounds normal; no masses,  no organomegaly Extremities: edema 3+ edema  bilaterally Neurologic: Grossly normal New left upper arm dialysis graft With palpable thrill Diagnostic Studies & Laboratory data:     Recent Radiology Findings:  Dg Chest 2 View  03/01/2015  CLINICAL DATA:  History of pleural effusion EXAM: CHEST  2 VIEW COMPARISON:  Portable chest x-ray of February 10, 2015 and CT scan of the chest of February 12, 2015 FINDINGS: On the right a small pleural effusion is present. The small caliber pleural space catheter has its tip overlying the posterior medial aspect of the eighth rib. There is no pneumothorax. On the left there is a moderate-sized pleural effusion optic occupying least 1/2 to 3 fifth of the lung volume. There is no pneumothorax. A large right paratracheal mass persists. The left heart border is obscured. The central pulmonary vascularity is prominent and there is mild pulmonary interstitial edema slightly less conspicuous than on the previous study. A dual-lumen catheter is in place via the right internal jugular approach with the tip of the proximal port overlying the proximal SVC and the distal port overlying the mid SVC. IMPRESSION: 1. Interval increase in the volume of pleural fluid on the left such did occupies at least 1/2 of the pleural space. On the right a small pleural effusion persist. The chest tube is unchanged in position. 2. Stable low-grade CHF.  Stable right paratracheal mass. Electronically Signed   By: David  Martinique M.D.   On: 03/01/2015 09:13  I have independently reviewed the above radiologic studies.  Recent Lab Findings: Lab Results  Component Value Date   WBC 6.1 02/15/2015   HGB 8.8* 02/15/2015   HCT 27.8* 02/15/2015   PLT 144* 02/15/2015   GLUCOSE 132* 02/15/2015   ALT 27 02/09/2015   AST 27 02/09/2015   NA 135 02/15/2015   K 3.7 02/15/2015   CL 100* 02/15/2015   CREATININE 2.72* 02/15/2015   BUN 16 02/15/2015   CO2 30 02/15/2015   TSH <0.010* 01/01/2015   INR 1.14 02/12/2015   HGBA1C 6.2* 07/27/2014    todays labs still pending    Assessment / Plan:   #1 continued bilateral pleural effusions , controlled on right with pleurix, now increasing on left  Over the past several days patient's had increasing shortness of breath follow-up chest x-ray today shows increasing left pleural effusion, I recommended to her that we proceed with placement of left Pleurx catheter to keep her respiratory status comfortable.   The goals risks and alternatives of the planned surgical procedure left pleurix catheter  have been discussed with the patient in detail. The risks of the procedure including death, infection, stroke, myocardial infarction, bleeding, blood transfusion have all been discussed specifically.  I have quoted Lurline Idol  Sheaffer a 5 % of perioperative mortality and a complication rate as high as 20 %. Patient is very frail but will benefit from control of effusion . The patient's questions have been answered.CARLEENA MIRES is willing  to proceed with the planned procedure.  [name] MD      [address] [place] [address] Office [phone]   Beeper [phone]  2015-03-10 10:04 AM

## 2015-03-11 NOTE — Consult Note (Signed)
   [facility] CM Inpatient Consult   03/25/2015  Alison Gross 04/23/1932 [id number] Call received from [facility] office regarding patient from Dr. Servando Snare.  Paged and spoke with Dr. Servando Snare regarding patient's Hospice providers.  Chart review reveals, that patient is under the care management (Per EPIC notes) Alison Gross) with contact number of [phone]. No further needs, identified.  Natividad Brood, RN BSN [facility] Liaison  [phone] business mobile phone

## 2015-03-11 NOTE — Transfer of Care (Signed)
Immediate Anesthesia Transfer of Care Note  Patient: Alison Gross  Procedure(s) Performed: Procedure(s): INSERTION LEFT PLEURAL DRAINAGE CATHETER (Left)  Patient Location: PACU  Anesthesia Type:MAC  Level of Consciousness: awake  Airway & Oxygen Therapy: Patient Spontanous Breathing and Patient connected to face mask oxygen  Post-op Assessment: Report given to RN and Post -op Vital signs reviewed and stable  Post vital signs: Reviewed and stable  Last Vitals:  Filed Vitals:   03-22-15 1022  BP: 112/40  Pulse: 52  Temp: 88.3 C    Complications: No apparent anesthesia complications

## 2015-03-11 NOTE — Anesthesia Preprocedure Evaluation (Addendum)
Anesthesia Evaluation  Patient identified by MRN, date of birth, ID band Patient awake    Reviewed: Allergy & Precautions, NPO status , Patient's Chart, lab work & pertinent test results  Airway Mallampati: I  TM Distance: >3 FB Neck ROM: Full    Dental  (+) Edentulous Upper, Edentulous Lower   Pulmonary shortness of breath, pneumonia, resolved, former smoker,     + decreased breath sounds      Cardiovascular hypertension, Pt. on medications +CHF  Normal cardiovascular exam+ dysrhythmias Atrial Fibrillation  Rhythm:Regular Rate:Normal     Neuro/Psych    GI/Hepatic GERD  Medicated and Controlled,  Endo/Other  diabetes, Type 2, Oral Hypoglycemic AgentsHyperthyroidism   Renal/GU CRFRenal disease     Musculoskeletal   Abdominal   Peds  Hematology  (+) anemia ,   Anesthesia Other Findings   Reproductive/Obstetrics                            Anesthesia Physical  Anesthesia Plan  ASA: IV  Anesthesia Plan: MAC   Post-op Pain Management:    Induction: Intravenous  Airway Management Planned: Natural Airway  Additional Equipment:   Intra-op Plan:   Post-operative Plan:   Informed Consent: I have reviewed the patients History and Physical, chart, labs and discussed the procedure including the risks, benefits and alternatives for the proposed anesthesia with the patient or authorized representative who has indicated his/her understanding and acceptance.   Dental advisory given  Plan Discussed with: CRNA  Anesthesia Plan Comments:         Anesthesia Quick Evaluation

## 2015-03-11 NOTE — Op Note (Signed)
NAMEMarland Kitchen  Alison Gross, Alison Gross NO.:  [id number]  MEDICAL RECORD NO.:  [id number]  LOCATION:  [facility]                         FACILITY:  [place]  PHYSICIAN:  Lanelle Bal, MD    DATE OF BIRTH:  1932-10-24  DATE OF PROCEDURE:  Mar 18, 2015 DATE OF DISCHARGE:  2015-03-18                              OPERATIVE REPORT   PREOPERATIVE DIAGNOSIS:  Recurrent left pleural effusion, probably secondary to congestive heart failure and chronic renal failure.  POSTOPERATIVE DIAGNOSIS:  Recurrent left pleural effusion, probably secondary to congestive heart failure and chronic renal failure.  SURGICAL PROCEDURE:  Placement of left PleurX catheter and drainage of 1.5 L of pleural fluid with fluoroscopic and ultrasound guidance.  SURGEON:  Lanelle Bal, MD.  BRIEF HISTORY:  The patient is an 82 year old female with chronic congestive heart failure and renal failure who is currently under hospice care who came to the office yesterday because of significantly increasing shortness of breath and large left pleural effusion.  She has previously had a right PleurX catheter placed which had been controlling her right pleural effusion.  She had been on dialysis, but several weeks prior was discharged home under hospice care and no further dialysis to provide the patient some relief from increasing difficulty breathing in a patient who was otherwise getting around the house and alert and oriented.  I recommended thoracentesis with placement of PleurX catheter for the patient.  She and her family agreed with her previous experience with the PleurX catheter and being able to control the fluid without repeat hospital visits, they preferred placement of PleurX.  Risks and options were discussed in detail.  DESCRIPTION OF PROCEDURE:  With the left side previously marked, the patient was brought to the operating room under MAC anesthesia.  The left chest was prepped and draped.  After the patient  was draped, the lab called with serum potassium was markedly elevated.  Even with this, the patient was stable and had no obvious EKG changes.  Anesthesia did medically treat her hypokalemia.  We decided to proceed with placement of a PleurX.  Appropriate time-out was performed.  Under ultrasound guidance, the area of pleural fluid on the left was easily identified. 1% lidocaine, a total of 11 mL, was infiltrated.  A 16-gauge needle was introduced into the left pleural space.  It was easy with removal of straw-colored fluid.  A guidewire was placed through the needle under fluoroscopic guidance into the left chest, a small incision was made more anteriorly, and the PleurX catheter was tunneled subcutaneously. Over the over the wire, dilator was placed and then a peel-away sheath introduced in the left chest, and the PleurX was positioned into the left chest.  We then secured the catheter, closed the small insertion incision and drained 1.5 L of straw-colored fluid from the patient's left chest.  Dermabond was applied, dressing was applied, and the patient was returned to the recovery room following the procedure.  We discussed the elevated potassium with Dr. Konrad Dolores of the hospice care who agreed with discharging the patient, and the hospice nurse would visit her at home today.  The patient tolerated the procedure without obvious complications.  Sponge and needle  count was reported as correct at completion of procedure.  Blood loss was minimal.     Lanelle Bal, MD     EG/MEDQ  D:  March 16, 2015  T:  03/16/15  Job:  [id number]

## 2015-03-11 NOTE — Anesthesia Postprocedure Evaluation (Signed)
Anesthesia Post Note  Patient: Alison Gross  Procedure(s) Performed: Procedure(s) (LRB): INSERTION LEFT PLEURAL DRAINAGE CATHETER (Left)  Patient location during evaluation: PACU Anesthesia Type: MAC Level of consciousness: awake and alert Pain management: pain level controlled Vital Signs Assessment: post-procedure vital signs reviewed and stable Respiratory status: spontaneous breathing Cardiovascular status: stable Anesthetic complications: no    Last Vitals:  Filed Vitals:   03-03-15 1245 Mar 03, 2015 1300  BP:    Pulse: 66   Temp:  36.3 C  Resp:      Last Pain:  Filed Vitals:   03-Mar-2015 1310  PainSc: [place]

## 2015-03-11 NOTE — Discharge Instructions (Signed)
Drain pleur ix on left and right daily Food Basics for Chronic Kidney Disease When your kidneys are not working well, they cannot remove waste and excess substances from your blood as effectively as they did before. This can lead to a buildup and imbalance of these substances, which can affect how your body functions. This buildup can also make your kidneys work harder, causing even more damage. You may need to eat less of certain foods that can lead to the buildup of these substances in your body. By making the changes to your diet that are recommended by your dietitian or health care provider, you could possibly help prevent further kidney damage and delay or prevent the need for dialysis. The following information can help give you a basic understanding of these substances and how they affect your bodily functions. The information also gives examples of foods that contain the highest amounts of these substances. WHAT DO I NEED TO KNOW ABOUT SUBSTANCES IN MY FOOD THAT I MAY NEED TO ADJUST? Food adjustments will be different for each person with chronic kidney disease. It is important that you see a dietitian who can help you determine the specific adjustments that you will need to make for each of the following substances: Potassium Potassium affects how steadily your heart beats. If too much potassium builds up in your blood, it can cause an irregular heartbeat or even a heart attack. Examples of foods rich in potassium include:  Milk.  Fruits.  Vegetables. Phosphorus Phosphorus is a mineral found in your bones. A balance between calcium and phosphorous is needed to build and maintain healthy bones. Too much phosphorus pulls calcium from your bones. This can make your bones weak and more likely to break. Too much phosphorus can also make your skin itch. Examples of foods rich in phosphorus include:  Milk and cheese.  Dried beans.  Peas.  Colas.  Nuts and peanut butter. Animal  Protein Animal protein helps you make and keep muscle. It also helps in the repair of your body's cells and tissues. One of the natural breakdown products of protein is a waste product called urea. When your kidneys are not working properly, they cannot remove wastes such as urea like they did before you developed chronic kidney disease. You will likely need to limit the amount of protein you eat to help prevent a buildup of urea in your blood. Examples of animal protein include:  Meat (all types).  Fish and seafood.  Poultry.  Eggs. Sodium Sodium, which is found in salt, helps maintain a healthy balance of fluids in your body. Too much sodium can increase your blood pressure level and have a negative affect on the function of your heart and lungs. Too much sodium also can cause your body to retain too much fluid, making your kidneys work harder. Examples of foods with high levels of sodium include:  Salt seasonings.  Soy sauce.  Cured and processed meats.  Salted crackers and snack foods.  Fast food.  Canned soups and most canned foods. Glucose Glucose provides energy for your body. If you have diabetes mellitus that is not properly controlled, you have too much glucose in your blood. Too much glucose in your blood can worsen the function of your kidneys by damaging small blood vessels. This prevents enough blood flow to your kidneys to give them what they need to work. If you have diabetes mellitus and chronic kidney disease, it is important to maintain your blood glucose at a level  recommended by your health care provider. SHOULD I TAKE A VITAMIN AND MINERAL SUPPLEMENT? Because you may need to avoid eating certain foods, you may not get all of the vitamins and minerals that would normally come from those foods. Your health care provider or dietitian may recommend that you take a supplement to ensure that you get all of the vitamins and minerals that your body needs.    This  information is not intended to replace advice given to you by your health care provider. Make sure you discuss any questions you have with your health care provider.   Document Released: 05/17/2002 Document Revised: 03/17/2014 Document Reviewed: 01/21/2013 Elsevier Interactive Patient Education 2016 [place].  Potassium Content of Foods Potassium is a mineral found in many foods and drinks. It helps keep fluids and minerals balanced in your body and affects how steadily your heart beats. Potassium also helps control your blood pressure and keep your muscles and nervous system healthy. Certain health conditions and medicines may change the balance of potassium in your body. When this happens, you can help balance your level of potassium through the foods that you do or do not eat. Your health care provider or dietitian may recommend an amount of potassium that you should have each day. The following lists of foods provide the amount of potassium (in parentheses) per serving in each item. HIGH IN POTASSIUM  The following foods and beverages have 200 mg or more of potassium per serving:  Apricots, 2 raw or 5 dry (200 mg).  Artichoke, 1 medium (345 mg).  Avocado, raw,  each (245 mg).  Banana, 1 medium (425 mg).  Beans, lima, or baked beans, canned,  cup (280 mg).  Beans, white, canned,  cup (595 mg).  Beef roast, 3 oz (320 mg).  Beef, ground, 3 oz (270 mg).  Beets, raw or cooked,  cup (260 mg).  Bran muffin, 2 oz (300 mg).  Broccoli,  cup (230 mg).  Brussels sprouts,  cup (250 mg).  Cantaloupe,  cup (215 mg).  Cereal, 100% bran,  cup (200-400 mg).  Cheeseburger, single, fast food, 1 each (225-400 mg).  Chicken, 3 oz (220 mg).  Clams, canned, 3 oz (535 mg).  Crab, 3 oz (225 mg).  Dates, 5 each (270 mg).  Dried beans and peas,  cup (300-475 mg).  Figs, dried, 2 each (260 mg).  Fish: halibut, tuna, cod, snapper, 3 oz (480 mg).  Fish: salmon, haddock,  swordfish, perch, 3 oz (300 mg).  Fish, tuna, canned 3 oz (200 mg).  [place] fries, fast food, 3 oz (470 mg).  Granola with fruit and nuts,  cup (200 mg).  Grapefruit juice,  cup (200 mg).  Greens, beet,  cup (655 mg).  Honeydew melon,  cup (200 mg).  Kale, raw, 1 cup (300 mg).  Kiwi, 1 medium (240 mg).  Kohlrabi, rutabaga, parsnips,  cup (280 mg).  Lentils,  cup (365 mg).  Mango, 1 each (325 mg).  Milk, chocolate, 1 cup (420 mg).  Milk: nonfat, low-fat, whole, buttermilk, 1 cup (350-380 mg).  Molasses, 1 Tbsp (295 mg).  Mushrooms,  cup (280) mg.  Nectarine, 1 each (275 mg).  Nuts: almonds, peanuts, hazelnuts, [place], cashew, mixed, 1 oz (200 mg).  Nuts, pistachios, 1 oz (295 mg).  Orange, 1 each (240 mg).  Orange juice,  cup (235 mg).  Papaya, medium,  fruit (390 mg).  Peanut butter, chunky, 2 Tbsp (240 mg).  Peanut butter, smooth, 2 Tbsp (210 mg).  Pear, 1 medium (200 mg).  Pomegranate, 1 whole (400 mg).  Pomegranate juice,  cup (215 mg).  Pork, 3 oz (350 mg).  Potato chips, salted, 1 oz (465 mg).  Potato, baked with skin, 1 medium (925 mg).  Potatoes, boiled,  cup (255 mg).  Potatoes, mashed,  cup (330 mg).  Prune juice,  cup (370 mg).  Prunes, 5 each (305 mg).  Pudding, chocolate,  cup (230 mg).  Pumpkin, canned,  cup (250 mg).  Raisins, seedless,  cup (270 mg).  Seeds, sunflower or pumpkin, 1 oz (240 mg).  Soy milk, 1 cup (300 mg).  Spinach,  cup (420 mg).  Spinach, canned,  cup (370 mg).  Sweet potato, baked with skin, 1 medium (450 mg).  Swiss chard,  cup (480 mg).  Tomato or vegetable juice,  cup (275 mg).  Tomato sauce or puree,  cup (400-550 mg).  Tomato, raw, 1 medium (290 mg).  Tomatoes, canned,  cup (200-300 mg).  [place], 3 oz (250 mg).  Wheat germ, 1 oz (250 mg).  Winter squash,  cup (250 mg).  Yogurt, plain or fruited, 6 oz (260-435 mg).  Zucchini,  cup (220 mg). MODERATE IN  POTASSIUM The following foods and beverages have 50-200 mg of potassium per serving:  Apple, 1 each (150 mg).  Apple juice,  cup (150 mg).  Applesauce,  cup (90 mg).  Apricot nectar,  cup (140 mg).  Asparagus, small spears,  cup or 6 spears (155 mg).  Bagel, cinnamon raisin, 1 each (130 mg).  Bagel, egg or plain, 4 in., 1 each (70 mg).  Beans, green,  cup (90 mg).  Beans, yellow,  cup (190 mg).  Beer, regular, 12 oz (100 mg).  Beets, canned,  cup (125 mg).  Blackberries,  cup (115 mg).  Blueberries,  cup (60 mg).  Bread, whole wheat, 1 slice (70 mg).  Broccoli, raw,  cup (145 mg).  Cabbage,  cup (150 mg).  Carrots, cooked or raw,  cup (180 mg).  Cauliflower, raw,  cup (150 mg).  Celery, raw,  cup (155 mg).  Cereal, bran flakes, cup (120-150 mg).  Cheese, cottage,  cup (110 mg).  Cherries, 10 each (150 mg).  Chocolate, 1 oz bar (165 mg).  Coffee, brewed 6 oz (90 mg).  Corn,  cup or 1 ear (195 mg).  Cucumbers,  cup (80 mg).  Egg, large, 1 each (60 mg).  Eggplant,  cup (60 mg).  Endive, raw, cup (80 mg).  English muffin, 1 each (65 mg).  Fish, orange roughy, 3 oz (150 mg).  Frankfurter, beef or pork, 1 each (75 mg).  Fruit cocktail,  cup (115 mg).  Grape juice,  cup (170 mg).  Grapefruit,  fruit (175 mg).  Grapes,  cup (155 mg).  Greens: kale, turnip, collard,  cup (110-150 mg).  Ice cream or frozen yogurt, chocolate,  cup (175 mg).  Ice cream or frozen yogurt, vanilla,  cup (120-150 mg).  Lemons, limes, 1 each (80 mg).  Lettuce, all types, 1 cup (100 mg).  Mixed vegetables,  cup (150 mg).  Mushrooms, raw,  cup (110 mg).  Nuts: walnuts, pecans, or macadamia, 1 oz (125 mg).  Oatmeal,  cup (80 mg).  Okra,  cup (110 mg).  Onions, raw,  cup (120 mg).  Peach, 1 each (185 mg).  Peaches, canned,  cup (120 mg).  Pears, canned,  cup (120 mg).  Peas, green, frozen,  cup (90 mg).  Peppers,  green,  cup (130  mg).  Peppers, red,  cup (160 mg).  Pineapple juice,  cup (165 mg).  Pineapple, fresh or canned,  cup (100 mg).  Plums, 1 each (105 mg).  Pudding, vanilla,  cup (150 mg).  Raspberries,  cup (90 mg).  Rhubarb,  cup (115 mg).  Rice, wild,  cup (80 mg).  Shrimp, 3 oz (155 mg).  Spinach, raw, 1 cup (170 mg).  Strawberries,  cup (125 mg).  Summer squash  cup (175-200 mg).  Swiss chard, raw, 1 cup (135 mg).  Tangerines, 1 each (140 mg).  Tea, brewed, 6 oz (65 mg).  Turnips,  cup (140 mg).  Watermelon,  cup (85 mg).  Wine, red, table, 5 oz (180 mg).  Wine, white, table, 5 oz (100 mg). LOW IN POTASSIUM The following foods and beverages have less than 50 mg of potassium per serving.  Bread, white, 1 slice (30 mg).  Carbonated beverages, 12 oz (less than 5 mg).  Cheese, 1 oz (20-30 mg).  Cranberries,  cup (45 mg).  Cranberry juice cocktail,  cup (20 mg).  Fats and oils, 1 Tbsp (less than 5 mg).  Hummus, 1 Tbsp (32 mg).  Nectar: papaya, mango, or pear,  cup (35 mg).  Rice, white or brown,  cup (50 mg).  Spaghetti or macaroni,  cup cooked (30 mg).  Tortilla, flour or corn, 1 each (50 mg).  Waffle, 4 in., 1 each (50 mg).  Water chestnuts,  cup (40 mg).   This information is not intended to replace advice given to you by your health care provider. Make sure you discuss any questions you have with your health care provider.   Document Released: 10/08/2004 Document Revised: 03/01/2013 Document Reviewed: 01/21/2013 Elsevier Interactive Patient Education [employer].

## 2015-03-11 NOTE — Brief Op Note (Signed)
      [address]       [place] [address]             [phone]      03-18-15  11:34 AM  PATIENT:  Alison Gross  82 y.o. female  PRE-OPERATIVE DIAGNOSIS:  LEFT PLEURAL EFFUSION  POST-OPERATIVE DIAGNOSIS:  LEFT PLEURAL EFFUSION  PROCEDURE:  Procedure(s): INSERTION LEFT PLEURAL DRAINAGE CATHETER (Left)  SURGEON:  Surgeon(s) and Role:    * [name], MD - Primary   ANESTHESIA:   MAC  EBL:  Total I/O In: 100 [I.V.:100] Out: -   BLOOD ADMINISTERED:none  DRAINS: plurix [placed 1500 ml straw colored fluid drained  LOCAL MEDICATIONS USED:  LIDOCAINE 11 ml SPECIMEN:  No Specimen  DISPOSITION OF SPECIMEN:  N/A  COUNTS:  YES  TOURNIQUET:  * No tourniquets in log *  DICTATION: .Dragon Dictation  PLAN OF CARE: Discharge to home after PACU  PATIENT DISPOSITION:  PACU - hemodynamically stable.   Delay start of Pharmacological VTE agent (>24hrs) due to surgical blood loss or risk of bleeding: yes

## 2015-03-11 DEATH — deceased

## 2015-03-16 ENCOUNTER — Encounter: Payer: Self-pay | Admitting: Vascular Surgery

## 2015-03-22 ENCOUNTER — Telehealth: Payer: Self-pay | Admitting: Vascular Surgery

## 2015-03-22 NOTE — Telephone Encounter (Signed)
Spoke with patient's husband [name]. Patient passed away on March 31, 2015 at home.

## 2015-03-23 ENCOUNTER — Encounter: Payer: Commercial Managed Care - HMO | Admitting: Vascular Surgery

## 2015-08-25 ENCOUNTER — Other Ambulatory Visit: Payer: Self-pay | Admitting: Nurse Practitioner

## 2016-10-25 IMAGING — PT NM PET TUM IMG RESTAG (PS) SKULL BASE T - THIGH
7 series · 25 of 25 positions shown · non-contrast
Comparison: By 10/28/2013 pleura PET-CT

CLINICAL DATA: Subsequent treatment strategy for breast carcinoma.

EXAM:
NUCLEAR MEDICINE PET SKULL BASE TO THIGH
TECHNIQUE: 5.0 mCi F-18 FDG was injected intravenously. Full-ring PET imaging
was performed from the skull base to thigh after the radiotracer. CT
data was obtained and used for attenuation correction and anatomic
localization.
FASTING BLOOD GLUCOSE:  Value: 94 mg/dl

[Series 4: ct sk_thigh 5.0 b31f · axial · 5.0mm · 0.84mm/px · z∈[-994,-122]mm · 6 of 219 slices shown]
[im 1/219]
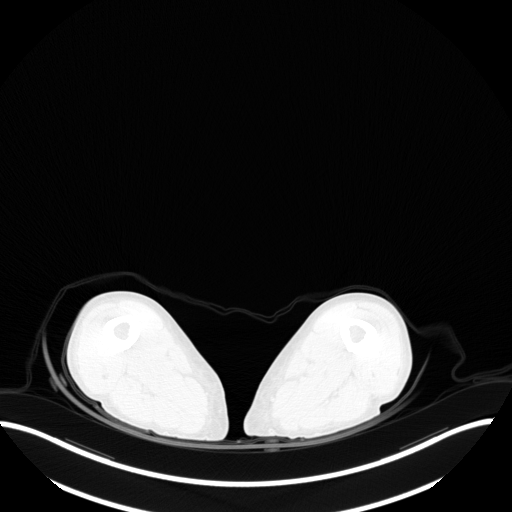
[im 44/219]
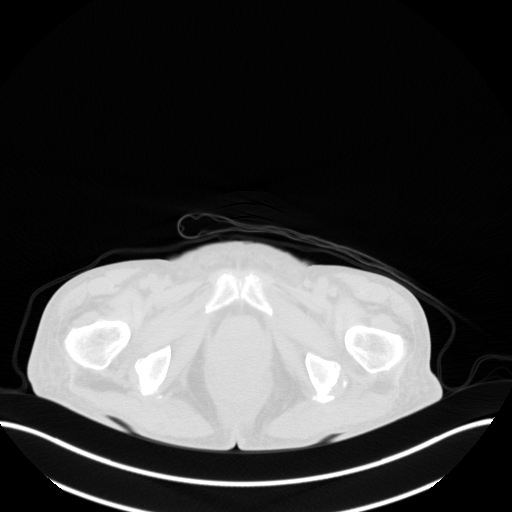
[im 88/219]
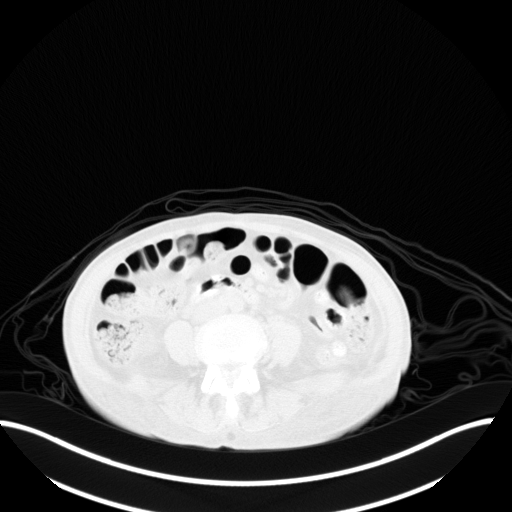
[im 131/219]
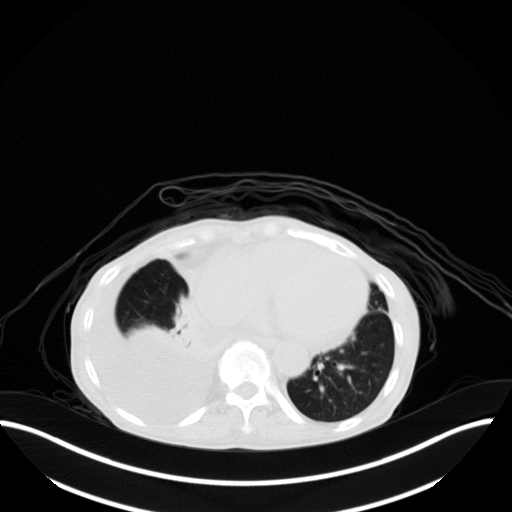
[im 175/219]
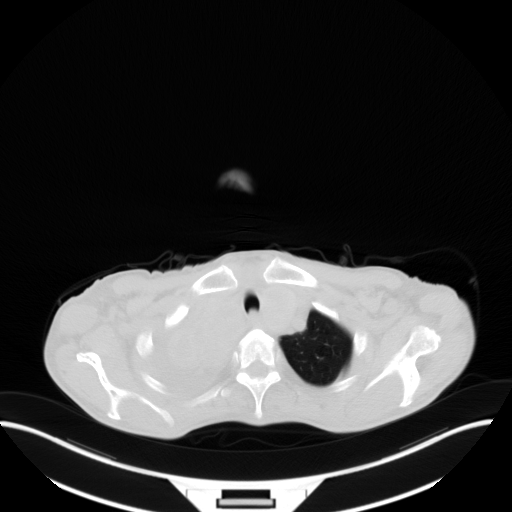
[im 219/219  brain]
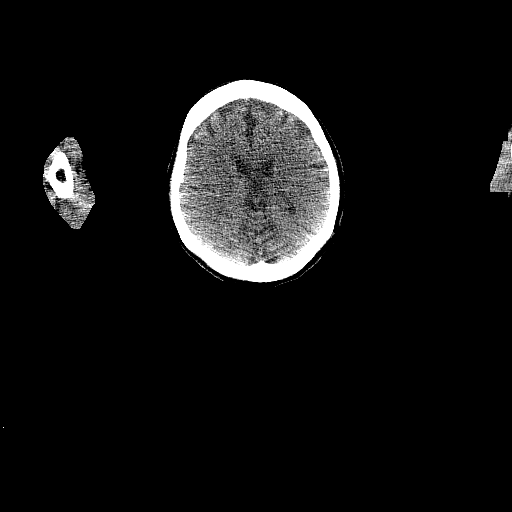

[Series 6: pet sk_thigh ac · axial · 5.0mm · 4.07mm/px · z∈[-994,-122]mm · 5 of 219 slices shown]
[im 1/219]
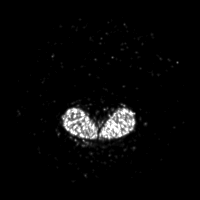
[im 55/219]
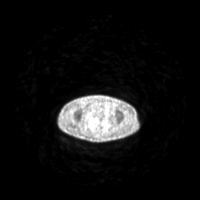
[im 110/219]
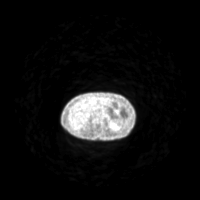
[im 164/219]
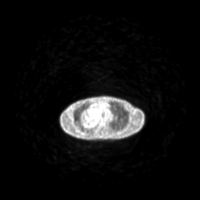
[im 219/219]
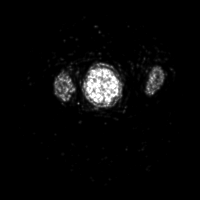

[Series 7: ct sk_thigh 5.0 b70f (id)_bone · axial · 5.0mm · 0.63mm/px · z∈[-548,-260]mm · 2 of 73 slices shown]
[im 1/73  bone]
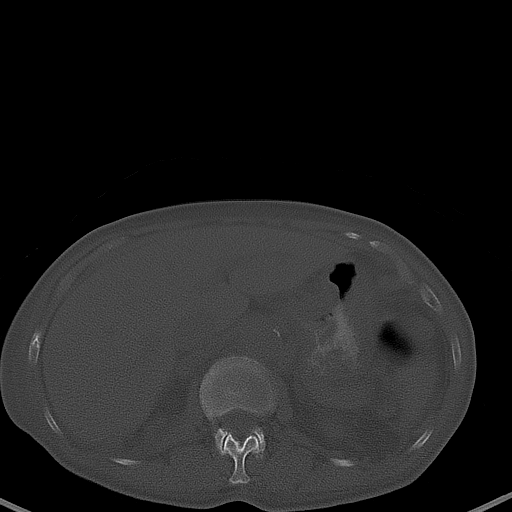
[im 73/73  bone]
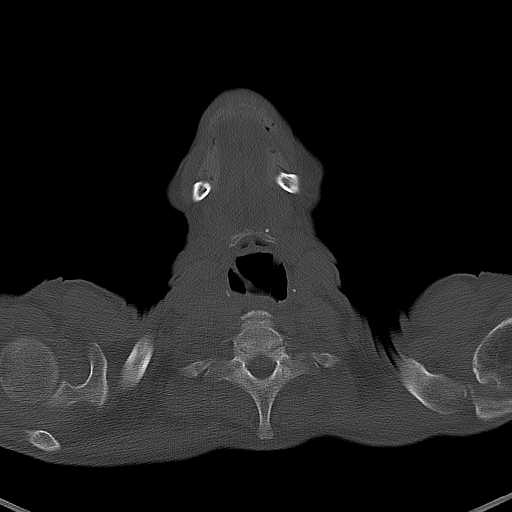

[Series 9: pet sk_thigh nac · axial · 5.0mm · 4.07mm/px · z∈[-994,-122]mm · 5 of 219 slices shown]
[im 1/219]
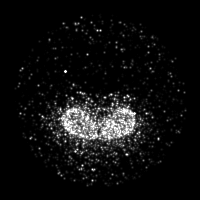
[im 55/219]
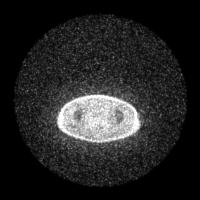
[im 110/219]
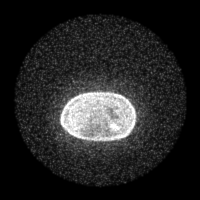
[im 164/219]
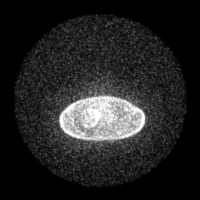
[im 219/219]
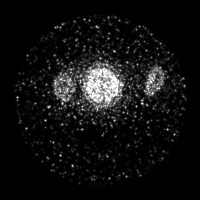

[Series 603: mip collection<mip range> · coronal · 1.81mm/px · 1 of 32 slices shown]
[im 1/32]
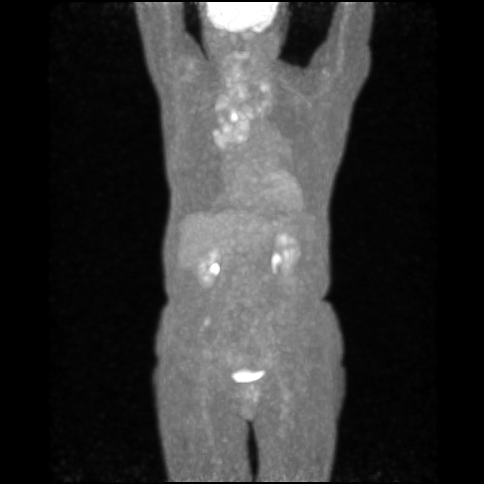

[Series 604: range-ct sk_thigh 5.0 (id)<alpha range> · 1 of 58 slices shown (1 of 2)]
[im 1/58]
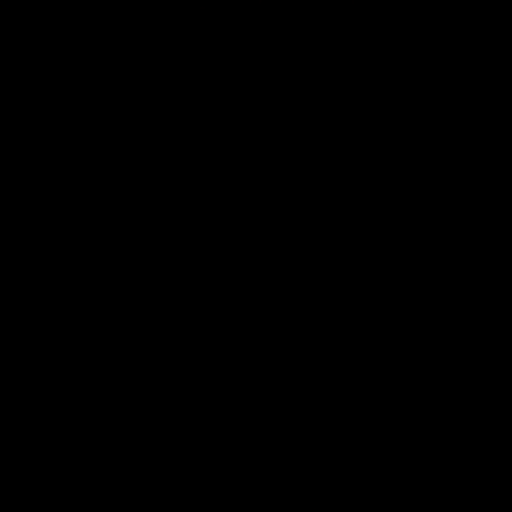

[Series 605: range-ct sk_thigh 5.0 (id)<alpha range> · 5 of 189 slices shown (2 of 2)]
[im 1/189]
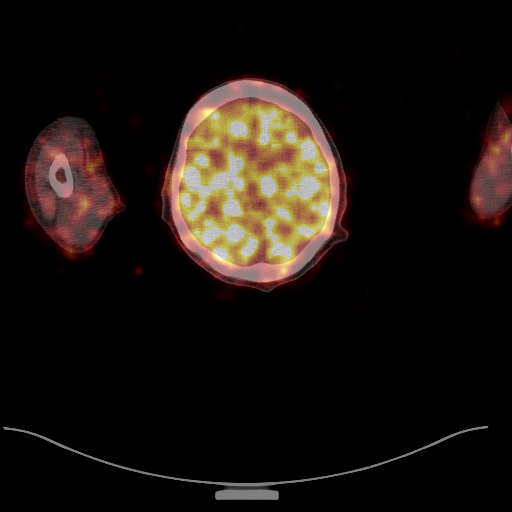
[im 48/189]
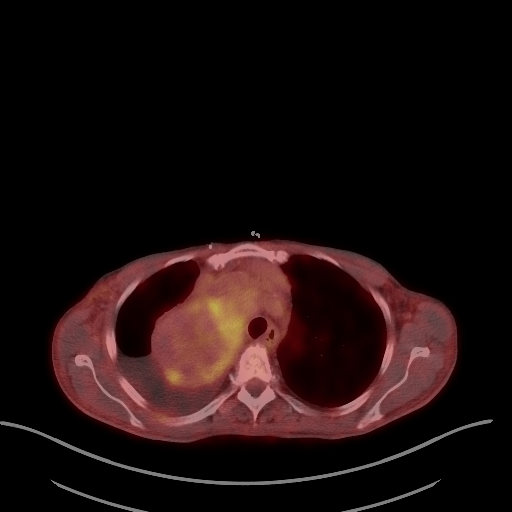
[im 95/189]
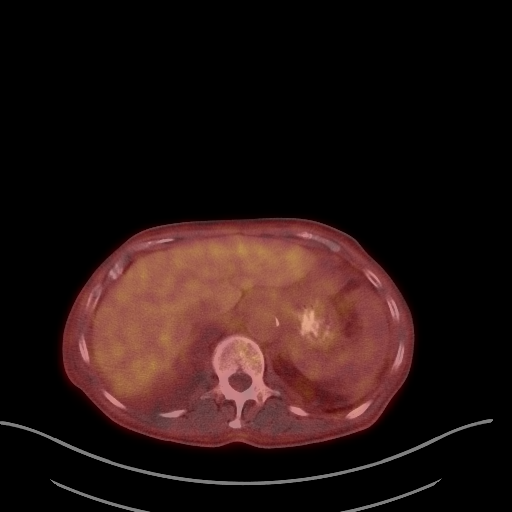
[im 142/189]
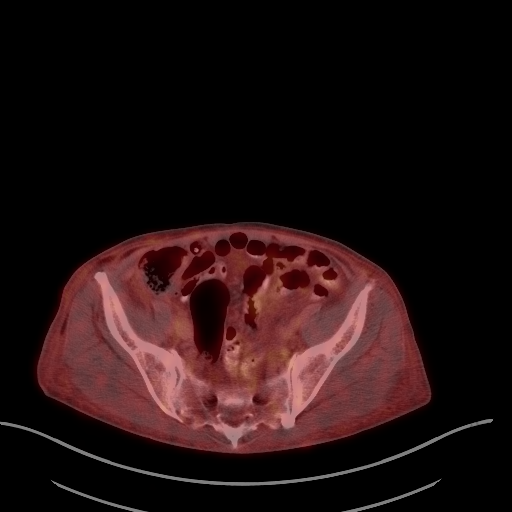
[im 189/189]
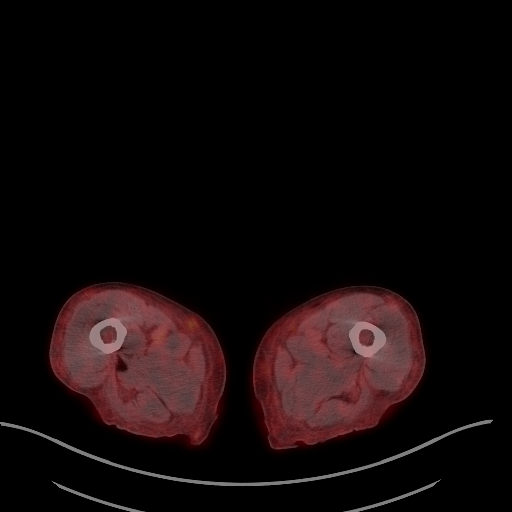

[25 of 25 positions shown; findings below may reference images not displayed]

FINDINGS: NECK

The thyroid gland is massively enlarged with a large portion
extending into the right hemi thorax. There is variable metabolic
activity scattered throughout this enlarged gland which is to
similar prior.

CHEST

There is a moderate-sized right pleural effusion. No hypermetabolic
mediastinal lymph nodes. No hypermetabolic axillary nodes. No
hypermetabolic pulmonary nodules.

ABDOMEN/PELVIS

No abnormal hypermetabolic activity within the liver, pancreas,
adrenal glands, or spleen. No hypermetabolic lymph nodes in the
abdomen or pelvis.

SKELETON

No focal hypermetabolic activity to suggest skeletal metastasis.
IMPRESSION: 1. No evidence of breast cancer recurrence or metastasis.
2. Moderate to large right pleural effusion.
3. Small free fluid in the pelvis.
4. Massive thyroid goiter extending into the right hemi thorax.

## 2016-10-26 IMAGING — CR DG CHEST 1V
1 series · 1 of 1 positions shown · non-contrast
Comparison: 09/19/2014

CLINICAL DATA: Status post right thoracentesis

EXAM:
CHEST  1 VIEW

[view not recorded]
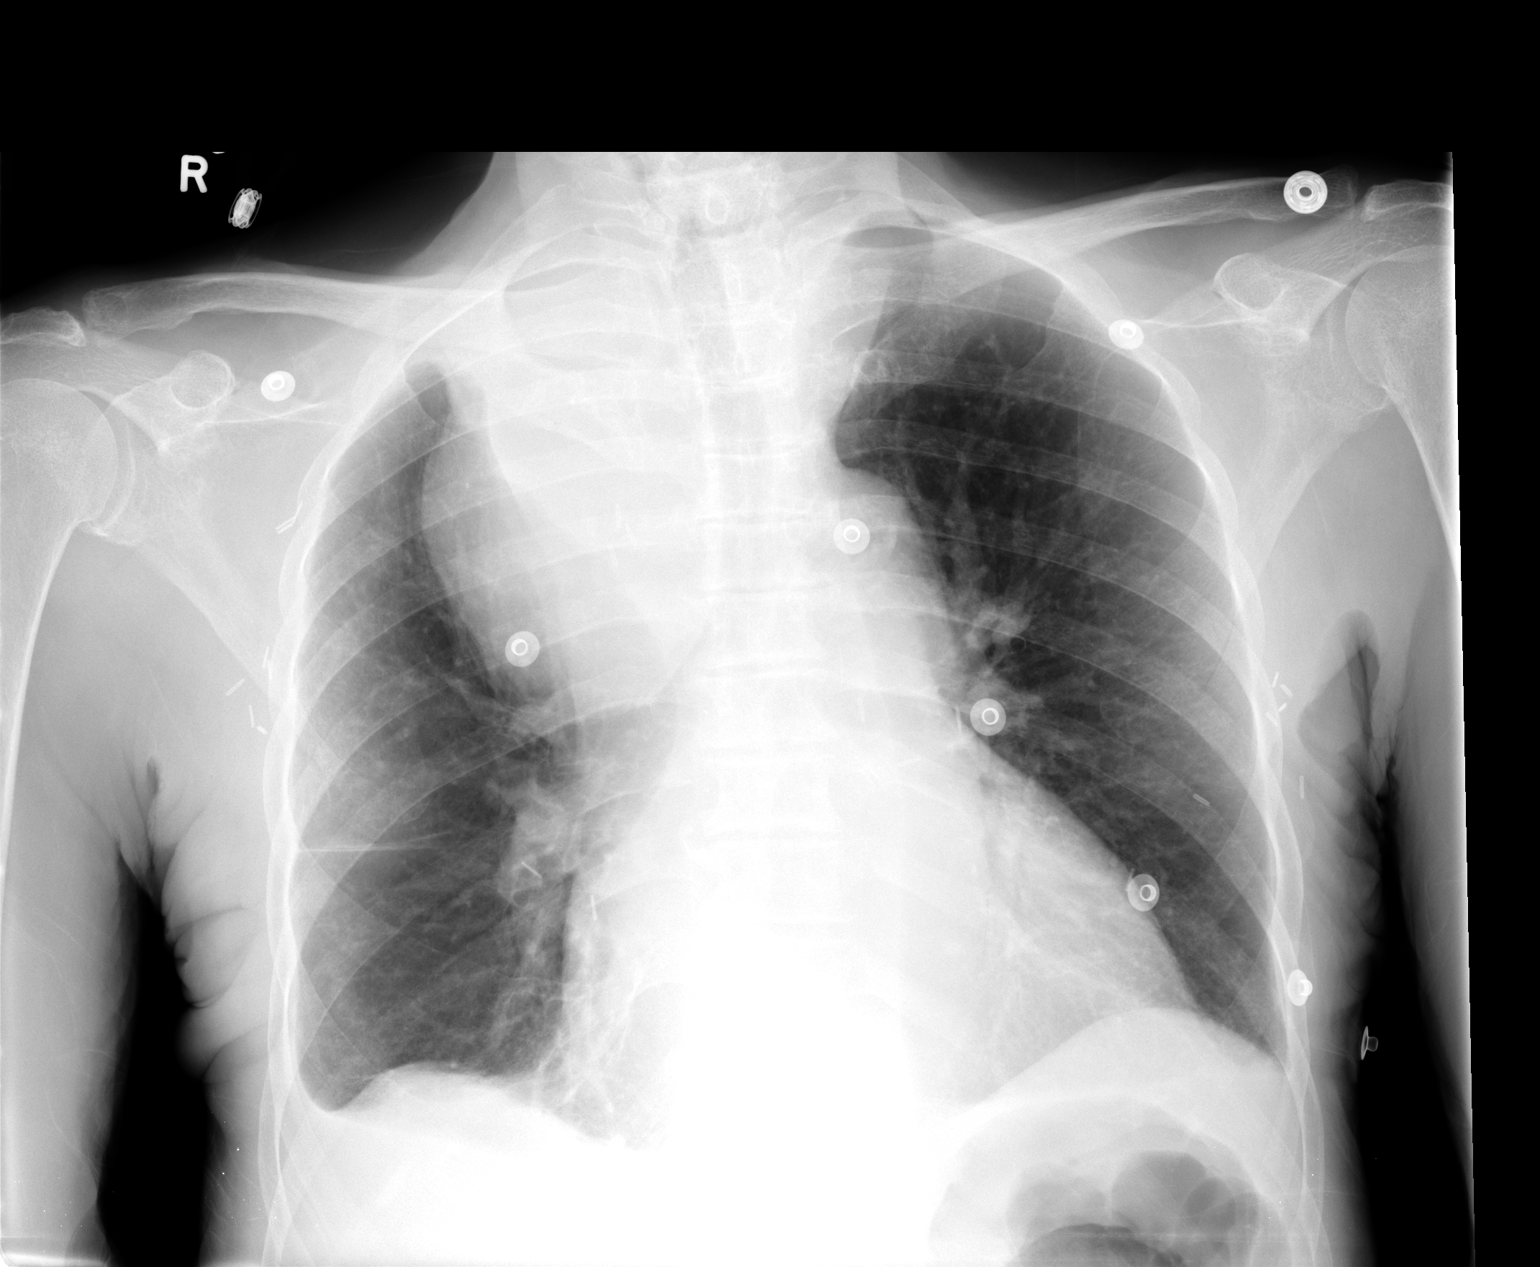

[1 of 1 positions shown; findings below may reference images not displayed]

FINDINGS: Negative for pneumothorax following right thoracentesis. Trace right
effusion remains. Improved right middle and lower lobe aeration with
some residual atelectasis. Large mediastinal mass along the right
peritracheal region compatible with a known massive thyroid goiter.
Left lung remains clear. Stable cardiomegaly and vascularity. No
superimposed edema.
IMPRESSION: Negative for pneumothorax following right thoracentesis. Decrease in
the right effusion. Otherwise stable exam.

## 2018-08-04 ENCOUNTER — Other Ambulatory Visit: Payer: Self-pay | Admitting: *Deleted
